# Patient Record
Sex: Female | Born: 1940
Health system: Southern US, Community
[De-identification: ages and names within clinical notes are randomized; demographics above are authoritative.]

## PROBLEM LIST (undated history)

## (undated) DIAGNOSIS — K589 Irritable bowel syndrome without diarrhea: Secondary | ICD-10-CM

## (undated) DIAGNOSIS — R079 Chest pain, unspecified: Secondary | ICD-10-CM

## (undated) DIAGNOSIS — F32A Depression, unspecified: Secondary | ICD-10-CM

## (undated) DIAGNOSIS — K219 Gastro-esophageal reflux disease without esophagitis: Secondary | ICD-10-CM

## (undated) DIAGNOSIS — G459 Transient cerebral ischemic attack, unspecified: Secondary | ICD-10-CM

## (undated) DIAGNOSIS — G4700 Insomnia, unspecified: Secondary | ICD-10-CM

## (undated) DIAGNOSIS — K635 Polyp of colon: Secondary | ICD-10-CM

## (undated) DIAGNOSIS — R739 Hyperglycemia, unspecified: Secondary | ICD-10-CM

## (undated) DIAGNOSIS — I319 Disease of pericardium, unspecified: Secondary | ICD-10-CM

## (undated) DIAGNOSIS — D0512 Intraductal carcinoma in situ of left breast: Secondary | ICD-10-CM

## (undated) DIAGNOSIS — K5730 Diverticulosis of large intestine without perforation or abscess without bleeding: Secondary | ICD-10-CM

## (undated) DIAGNOSIS — R51 Headache: Secondary | ICD-10-CM

## (undated) DIAGNOSIS — F329 Major depressive disorder, single episode, unspecified: Secondary | ICD-10-CM

## (undated) DIAGNOSIS — C801 Malignant (primary) neoplasm, unspecified: Secondary | ICD-10-CM

## (undated) DIAGNOSIS — I10 Essential (primary) hypertension: Secondary | ICD-10-CM

## (undated) DIAGNOSIS — M1990 Unspecified osteoarthritis, unspecified site: Secondary | ICD-10-CM

## (undated) DIAGNOSIS — H409 Unspecified glaucoma: Secondary | ICD-10-CM

## (undated) DIAGNOSIS — I639 Cerebral infarction, unspecified: Secondary | ICD-10-CM

## (undated) DIAGNOSIS — Z8719 Personal history of other diseases of the digestive system: Secondary | ICD-10-CM

## (undated) DIAGNOSIS — E785 Hyperlipidemia, unspecified: Secondary | ICD-10-CM

## (undated) DIAGNOSIS — E039 Hypothyroidism, unspecified: Secondary | ICD-10-CM

## (undated) DIAGNOSIS — N951 Menopausal and female climacteric states: Secondary | ICD-10-CM

## (undated) DIAGNOSIS — Z8489 Family history of other specified conditions: Secondary | ICD-10-CM

## (undated) DIAGNOSIS — E119 Type 2 diabetes mellitus without complications: Secondary | ICD-10-CM

## (undated) DIAGNOSIS — H269 Unspecified cataract: Secondary | ICD-10-CM

## (undated) DIAGNOSIS — G510 Bell's palsy: Secondary | ICD-10-CM

## (undated) HISTORY — PX: TUBAL LIGATION: SHX77

## (undated) HISTORY — DX: Cerebral infarction, unspecified: I63.9

## (undated) HISTORY — PX: FRACTURE SURGERY: SHX138

## (undated) HISTORY — PX: APPENDECTOMY: SHX54

## (undated) HISTORY — DX: Hypothyroidism, unspecified: E03.9

## (undated) HISTORY — DX: Menopausal and female climacteric states: N95.1

## (undated) HISTORY — PX: PARTIAL HYSTERECTOMY: SHX80

## (undated) HISTORY — DX: Polyp of colon: K63.5

## (undated) HISTORY — DX: Unspecified glaucoma: H40.9

## (undated) HISTORY — DX: Hyperglycemia, unspecified: R73.9

## (undated) HISTORY — DX: Hyperlipidemia, unspecified: E78.5

## (undated) HISTORY — DX: Transient cerebral ischemic attack, unspecified: G45.9

## (undated) HISTORY — DX: Essential (primary) hypertension: I10

## (undated) HISTORY — DX: Insomnia, unspecified: G47.00

## (undated) HISTORY — PX: TONSILLECTOMY: SUR1361

## (undated) HISTORY — PX: BREAST EXCISIONAL BIOPSY: SUR124

## (undated) HISTORY — DX: Unspecified cataract: H26.9

## (undated) HISTORY — PX: COLONOSCOPY: SHX174

## (undated) HISTORY — PX: CARDIAC CATHETERIZATION: SHX172

## (undated) HISTORY — DX: Diverticulosis of large intestine without perforation or abscess without bleeding: K57.30

---

## 1969-11-28 HISTORY — PX: ECTOPIC PREGNANCY SURGERY: SHX613

## 1979-07-30 DIAGNOSIS — I319 Disease of pericardium, unspecified: Secondary | ICD-10-CM

## 1979-07-30 HISTORY — DX: Disease of pericardium, unspecified: I31.9

## 1988-11-28 HISTORY — PX: CHOLECYSTECTOMY: SHX55

## 1998-06-05 ENCOUNTER — Emergency Department (HOSPITAL_COMMUNITY): Admission: EM | Admit: 1998-06-05 | Discharge: 1998-06-05 | Payer: Self-pay | Admitting: Emergency Medicine

## 1998-06-28 DIAGNOSIS — D126 Benign neoplasm of colon, unspecified: Secondary | ICD-10-CM | POA: Insufficient documentation

## 1998-07-21 ENCOUNTER — Other Ambulatory Visit: Admission: RE | Admit: 1998-07-21 | Discharge: 1998-07-21 | Payer: Self-pay | Admitting: Gastroenterology

## 1998-09-01 ENCOUNTER — Other Ambulatory Visit: Admission: RE | Admit: 1998-09-01 | Discharge: 1998-09-01 | Payer: Self-pay | Admitting: Gastroenterology

## 2000-03-13 ENCOUNTER — Encounter: Payer: Self-pay | Admitting: Obstetrics and Gynecology

## 2000-03-13 ENCOUNTER — Encounter: Admission: RE | Admit: 2000-03-13 | Discharge: 2000-03-13 | Payer: Self-pay | Admitting: Obstetrics and Gynecology

## 2000-08-15 ENCOUNTER — Encounter: Admission: RE | Admit: 2000-08-15 | Discharge: 2000-08-15 | Payer: Self-pay | Admitting: Family Medicine

## 2000-08-15 ENCOUNTER — Encounter: Payer: Self-pay | Admitting: Family Medicine

## 2001-03-26 ENCOUNTER — Encounter: Admission: RE | Admit: 2001-03-26 | Discharge: 2001-03-26 | Payer: Self-pay | Admitting: Obstetrics and Gynecology

## 2001-03-26 ENCOUNTER — Encounter: Payer: Self-pay | Admitting: Obstetrics and Gynecology

## 2001-10-28 DIAGNOSIS — K649 Unspecified hemorrhoids: Secondary | ICD-10-CM | POA: Insufficient documentation

## 2002-03-27 ENCOUNTER — Encounter: Payer: Self-pay | Admitting: Obstetrics and Gynecology

## 2002-03-27 ENCOUNTER — Encounter: Admission: RE | Admit: 2002-03-27 | Discharge: 2002-03-27 | Payer: Self-pay | Admitting: Obstetrics and Gynecology

## 2003-03-31 ENCOUNTER — Encounter: Admission: RE | Admit: 2003-03-31 | Discharge: 2003-03-31 | Payer: Self-pay | Admitting: Obstetrics and Gynecology

## 2003-03-31 ENCOUNTER — Encounter: Payer: Self-pay | Admitting: Obstetrics and Gynecology

## 2004-04-02 ENCOUNTER — Encounter: Admission: RE | Admit: 2004-04-02 | Discharge: 2004-04-02 | Payer: Self-pay | Admitting: Obstetrics and Gynecology

## 2004-08-28 ENCOUNTER — Encounter (INDEPENDENT_AMBULATORY_CARE_PROVIDER_SITE_OTHER): Payer: Self-pay | Admitting: Internal Medicine

## 2005-04-14 ENCOUNTER — Encounter: Admission: RE | Admit: 2005-04-14 | Discharge: 2005-04-14 | Payer: Self-pay | Admitting: Obstetrics and Gynecology

## 2005-06-24 ENCOUNTER — Ambulatory Visit: Payer: Self-pay | Admitting: Family Medicine

## 2005-06-28 DIAGNOSIS — K5730 Diverticulosis of large intestine without perforation or abscess without bleeding: Secondary | ICD-10-CM

## 2005-06-28 DIAGNOSIS — K449 Diaphragmatic hernia without obstruction or gangrene: Secondary | ICD-10-CM | POA: Insufficient documentation

## 2005-06-28 HISTORY — DX: Diverticulosis of large intestine without perforation or abscess without bleeding: K57.30

## 2005-06-28 LAB — HM COLONOSCOPY

## 2005-07-05 ENCOUNTER — Ambulatory Visit: Payer: Self-pay | Admitting: Gastroenterology

## 2005-07-21 ENCOUNTER — Ambulatory Visit: Payer: Self-pay | Admitting: Gastroenterology

## 2005-08-15 ENCOUNTER — Ambulatory Visit: Payer: Self-pay | Admitting: Family Medicine

## 2006-03-13 ENCOUNTER — Ambulatory Visit: Payer: Self-pay | Admitting: Family Medicine

## 2006-04-18 ENCOUNTER — Encounter: Admission: RE | Admit: 2006-04-18 | Discharge: 2006-04-18 | Payer: Self-pay | Admitting: Obstetrics and Gynecology

## 2006-07-25 ENCOUNTER — Ambulatory Visit: Payer: Self-pay | Admitting: Family Medicine

## 2006-12-11 ENCOUNTER — Ambulatory Visit: Payer: Self-pay | Admitting: Family Medicine

## 2006-12-11 LAB — CONVERTED CEMR LAB
ALT: 32 units/L (ref 0–40)
AST: 43 units/L — ABNORMAL HIGH (ref 0–37)
Chol/HDL Ratio, serum: 3.6
HDL: 46.6 mg/dL (ref >39.0)
Potassium: 3.7 meq/L (ref 3.5–5.1)
TSH: 0.2 microintl units/mL — ABNORMAL LOW (ref 0.35–5.50)
Triglyceride fasting, serum: 144 mg/dL (ref 0–149)
VLDL: 29 mg/dL (ref 0–40)

## 2007-01-30 ENCOUNTER — Ambulatory Visit: Payer: Self-pay | Admitting: Family Medicine

## 2007-01-30 LAB — CONVERTED CEMR LAB
Chloride: 104 meq/L (ref 96–112)
GFR calc Af Amer: 108 mL/min
Potassium: 3.8 meq/L (ref 3.5–5.1)
Sodium: 140 meq/L (ref 135–145)

## 2007-03-09 ENCOUNTER — Ambulatory Visit: Payer: Self-pay | Admitting: Family Medicine

## 2007-03-09 LAB — CONVERTED CEMR LAB: TSH: 0.05 microintl units/mL — ABNORMAL LOW (ref 0.35–5.50)

## 2007-04-20 ENCOUNTER — Encounter: Admission: RE | Admit: 2007-04-20 | Discharge: 2007-04-20 | Payer: Self-pay | Admitting: Obstetrics and Gynecology

## 2007-06-13 ENCOUNTER — Encounter (INDEPENDENT_AMBULATORY_CARE_PROVIDER_SITE_OTHER): Payer: Self-pay | Admitting: *Deleted

## 2007-07-17 ENCOUNTER — Encounter (INDEPENDENT_AMBULATORY_CARE_PROVIDER_SITE_OTHER): Payer: Self-pay | Admitting: Internal Medicine

## 2007-07-17 DIAGNOSIS — E782 Mixed hyperlipidemia: Secondary | ICD-10-CM

## 2007-07-17 DIAGNOSIS — R000 Tachycardia, unspecified: Secondary | ICD-10-CM

## 2007-07-17 DIAGNOSIS — R011 Cardiac murmur, unspecified: Secondary | ICD-10-CM

## 2007-07-17 DIAGNOSIS — Z87891 Personal history of nicotine dependence: Secondary | ICD-10-CM

## 2007-07-17 DIAGNOSIS — E785 Hyperlipidemia, unspecified: Secondary | ICD-10-CM

## 2007-07-17 DIAGNOSIS — Z8679 Personal history of other diseases of the circulatory system: Secondary | ICD-10-CM | POA: Insufficient documentation

## 2007-08-02 ENCOUNTER — Ambulatory Visit: Payer: Self-pay | Admitting: Family Medicine

## 2007-08-02 DIAGNOSIS — R609 Edema, unspecified: Secondary | ICD-10-CM

## 2007-08-02 DIAGNOSIS — E039 Hypothyroidism, unspecified: Secondary | ICD-10-CM | POA: Insufficient documentation

## 2007-09-10 ENCOUNTER — Ambulatory Visit: Payer: Self-pay | Admitting: Internal Medicine

## 2007-09-13 ENCOUNTER — Encounter (INDEPENDENT_AMBULATORY_CARE_PROVIDER_SITE_OTHER): Payer: Self-pay | Admitting: Internal Medicine

## 2007-11-06 ENCOUNTER — Ambulatory Visit: Payer: Self-pay | Admitting: Family Medicine

## 2007-11-30 ENCOUNTER — Encounter: Admission: RE | Admit: 2007-11-30 | Discharge: 2007-11-30 | Payer: Self-pay | Admitting: Obstetrics and Gynecology

## 2007-12-27 ENCOUNTER — Encounter (INDEPENDENT_AMBULATORY_CARE_PROVIDER_SITE_OTHER): Payer: Self-pay | Admitting: Internal Medicine

## 2007-12-28 ENCOUNTER — Encounter (INDEPENDENT_AMBULATORY_CARE_PROVIDER_SITE_OTHER): Payer: Self-pay | Admitting: Internal Medicine

## 2008-03-03 ENCOUNTER — Telehealth (INDEPENDENT_AMBULATORY_CARE_PROVIDER_SITE_OTHER): Payer: Self-pay | Admitting: Internal Medicine

## 2008-05-04 HISTORY — PX: CYSTECTOMY: SUR359

## 2008-06-23 ENCOUNTER — Encounter (INDEPENDENT_AMBULATORY_CARE_PROVIDER_SITE_OTHER): Payer: Self-pay | Admitting: Internal Medicine

## 2008-07-08 ENCOUNTER — Telehealth (INDEPENDENT_AMBULATORY_CARE_PROVIDER_SITE_OTHER): Payer: Self-pay | Admitting: Internal Medicine

## 2008-07-21 ENCOUNTER — Encounter: Payer: Self-pay | Admitting: Family Medicine

## 2008-09-11 ENCOUNTER — Telehealth (INDEPENDENT_AMBULATORY_CARE_PROVIDER_SITE_OTHER): Payer: Self-pay | Admitting: Internal Medicine

## 2008-09-16 ENCOUNTER — Ambulatory Visit: Payer: Self-pay | Admitting: Family Medicine

## 2008-09-16 DIAGNOSIS — K219 Gastro-esophageal reflux disease without esophagitis: Secondary | ICD-10-CM | POA: Insufficient documentation

## 2008-09-18 LAB — CONVERTED CEMR LAB
ALT: 28 units/L (ref 0–35)
BUN: 6 mg/dL (ref 6–23)
CO2: 28 meq/L (ref 19–32)
Calcium: 9.4 mg/dL (ref 8.4–10.5)
Chloride: 105 meq/L (ref 96–112)
Direct LDL: 154 mg/dL
GFR calc Af Amer: 108 mL/min
Glucose, Bld: 124 mg/dL — ABNORMAL HIGH (ref 70–99)
HDL: 53.4 mg/dL (ref >39.0)
Sodium: 141 meq/L (ref 135–145)
Total CHOL/HDL Ratio: 4.6
Triglycerides: 146 mg/dL (ref 0–149)
VLDL: 29 mg/dL (ref 0–40)

## 2008-09-19 ENCOUNTER — Ambulatory Visit: Payer: Self-pay | Admitting: Family Medicine

## 2008-09-19 DIAGNOSIS — E119 Type 2 diabetes mellitus without complications: Secondary | ICD-10-CM | POA: Insufficient documentation

## 2008-11-11 ENCOUNTER — Telehealth (INDEPENDENT_AMBULATORY_CARE_PROVIDER_SITE_OTHER): Payer: Self-pay | Admitting: Internal Medicine

## 2008-12-04 ENCOUNTER — Encounter: Admission: RE | Admit: 2008-12-04 | Discharge: 2008-12-04 | Payer: Self-pay | Admitting: Obstetrics and Gynecology

## 2008-12-15 ENCOUNTER — Ambulatory Visit: Payer: Self-pay | Admitting: Family Medicine

## 2008-12-17 ENCOUNTER — Telehealth (INDEPENDENT_AMBULATORY_CARE_PROVIDER_SITE_OTHER): Payer: Self-pay | Admitting: Internal Medicine

## 2008-12-17 LAB — CONVERTED CEMR LAB
Glucose, Bld: 107 mg/dL — ABNORMAL HIGH (ref 70–99)
HDL: 55.5 mg/dL (ref >39.0)
LDL Cholesterol: 87 mg/dL (ref 0–99)

## 2008-12-23 ENCOUNTER — Ambulatory Visit: Payer: Self-pay | Admitting: Family Medicine

## 2009-02-02 ENCOUNTER — Encounter: Payer: Self-pay | Admitting: Family Medicine

## 2009-02-03 ENCOUNTER — Telehealth: Payer: Self-pay | Admitting: Family Medicine

## 2009-02-19 ENCOUNTER — Telehealth (INDEPENDENT_AMBULATORY_CARE_PROVIDER_SITE_OTHER): Payer: Self-pay | Admitting: Internal Medicine

## 2009-03-24 ENCOUNTER — Ambulatory Visit: Payer: Self-pay | Admitting: Family Medicine

## 2009-03-25 ENCOUNTER — Telehealth (INDEPENDENT_AMBULATORY_CARE_PROVIDER_SITE_OTHER): Payer: Self-pay | Admitting: Internal Medicine

## 2009-03-25 LAB — CONVERTED CEMR LAB
Total CHOL/HDL Ratio: 5
Triglycerides: 175 mg/dL — ABNORMAL HIGH (ref 0.0–149.0)
VLDL: 35 mg/dL (ref 0.0–40.0)

## 2009-06-19 ENCOUNTER — Telehealth (INDEPENDENT_AMBULATORY_CARE_PROVIDER_SITE_OTHER): Payer: Self-pay | Admitting: Internal Medicine

## 2009-06-23 ENCOUNTER — Ambulatory Visit: Payer: Self-pay | Admitting: Family Medicine

## 2009-06-24 LAB — CONVERTED CEMR LAB
Direct LDL: 151.4 mg/dL
TSH: 0.52 microintl units/mL (ref 0.35–5.50)
Total CHOL/HDL Ratio: 5
Triglycerides: 270 mg/dL — ABNORMAL HIGH (ref 0.0–149.0)
VLDL: 54 mg/dL — ABNORMAL HIGH (ref 0.0–40.0)

## 2009-06-29 ENCOUNTER — Ambulatory Visit: Payer: Self-pay | Admitting: Family Medicine

## 2009-06-29 ENCOUNTER — Encounter (INDEPENDENT_AMBULATORY_CARE_PROVIDER_SITE_OTHER): Payer: Self-pay | Admitting: Internal Medicine

## 2009-07-16 ENCOUNTER — Encounter: Payer: Self-pay | Admitting: Family Medicine

## 2009-07-16 ENCOUNTER — Encounter (INDEPENDENT_AMBULATORY_CARE_PROVIDER_SITE_OTHER): Payer: Self-pay | Admitting: Internal Medicine

## 2009-08-11 ENCOUNTER — Ambulatory Visit: Payer: Self-pay | Admitting: Family Medicine

## 2009-08-11 DIAGNOSIS — IMO0002 Reserved for concepts with insufficient information to code with codable children: Secondary | ICD-10-CM

## 2009-08-11 LAB — CONVERTED CEMR LAB
Bacteria, UA: 0
Bilirubin Urine: NEGATIVE
Glucose, Urine, Semiquant: NEGATIVE
Ketones, urine, test strip: NEGATIVE
Nitrite: NEGATIVE

## 2009-11-14 ENCOUNTER — Encounter (INDEPENDENT_AMBULATORY_CARE_PROVIDER_SITE_OTHER): Payer: Self-pay | Admitting: Internal Medicine

## 2009-12-07 ENCOUNTER — Encounter: Admission: RE | Admit: 2009-12-07 | Discharge: 2009-12-07 | Payer: Self-pay | Admitting: Obstetrics and Gynecology

## 2010-01-15 ENCOUNTER — Telehealth: Payer: Self-pay | Admitting: Family Medicine

## 2010-04-15 ENCOUNTER — Telehealth: Payer: Self-pay | Admitting: Family Medicine

## 2010-05-21 ENCOUNTER — Encounter (INDEPENDENT_AMBULATORY_CARE_PROVIDER_SITE_OTHER): Payer: Self-pay | Admitting: *Deleted

## 2010-06-15 ENCOUNTER — Ambulatory Visit: Payer: Self-pay | Admitting: Family Medicine

## 2010-06-15 DIAGNOSIS — G4700 Insomnia, unspecified: Secondary | ICD-10-CM

## 2010-06-15 DIAGNOSIS — N951 Menopausal and female climacteric states: Secondary | ICD-10-CM | POA: Insufficient documentation

## 2010-06-16 ENCOUNTER — Telehealth: Payer: Self-pay | Admitting: Family Medicine

## 2010-06-16 LAB — CONVERTED CEMR LAB
ALT: 33 U/L
AST: 38 U/L — ABNORMAL HIGH
Albumin: 4.2 g/dL
Alkaline Phosphatase: 48 U/L
BUN: 11 mg/dL
Bilirubin, Direct: 0.1 mg/dL
CO2: 28 meq/L
Calcium: 9.5 mg/dL
Chloride: 107 meq/L
Cholesterol: 250 mg/dL — ABNORMAL HIGH
Creatinine, Ser: 0.7 mg/dL
Direct LDL: 157.2 mg/dL
GFR calc non Af Amer: 108.7 mL/min
Glucose, Bld: 106 mg/dL — ABNORMAL HIGH
HDL: 57.9 mg/dL
Potassium: 4.1 meq/L
Pro B Natriuretic peptide (BNP): 10.8 pg/mL
Sodium: 140 meq/L
TSH: 0.88 u[IU]/mL
Total Bilirubin: 0.6 mg/dL
Total CHOL/HDL Ratio: 4
Total Protein: 7 g/dL
Triglycerides: 323 mg/dL — ABNORMAL HIGH
VLDL: 64.6 mg/dL — ABNORMAL HIGH

## 2010-07-01 ENCOUNTER — Encounter (INDEPENDENT_AMBULATORY_CARE_PROVIDER_SITE_OTHER): Payer: Self-pay | Admitting: *Deleted

## 2010-07-13 ENCOUNTER — Encounter (INDEPENDENT_AMBULATORY_CARE_PROVIDER_SITE_OTHER): Payer: Self-pay | Admitting: *Deleted

## 2010-07-16 ENCOUNTER — Ambulatory Visit: Payer: Self-pay | Admitting: Family Medicine

## 2010-07-16 DIAGNOSIS — N390 Urinary tract infection, site not specified: Secondary | ICD-10-CM | POA: Insufficient documentation

## 2010-07-16 LAB — CONVERTED CEMR LAB
Bilirubin Urine: NEGATIVE
Blood in Urine, dipstick: NEGATIVE
Glucose, Urine, Semiquant: NEGATIVE
Ketones, urine, test strip: NEGATIVE
Nitrite: NEGATIVE
Urobilinogen, UA: 0.2

## 2010-07-17 ENCOUNTER — Encounter: Payer: Self-pay | Admitting: Family Medicine

## 2010-08-23 ENCOUNTER — Encounter (INDEPENDENT_AMBULATORY_CARE_PROVIDER_SITE_OTHER): Payer: Self-pay | Admitting: *Deleted

## 2010-08-26 ENCOUNTER — Ambulatory Visit: Payer: Self-pay | Admitting: Gastroenterology

## 2010-09-09 ENCOUNTER — Ambulatory Visit: Payer: Self-pay | Admitting: Gastroenterology

## 2010-09-12 ENCOUNTER — Encounter: Payer: Self-pay | Admitting: Gastroenterology

## 2010-10-08 ENCOUNTER — Telehealth: Payer: Self-pay | Admitting: Family Medicine

## 2010-11-12 LAB — HM DIABETES EYE EXAM

## 2010-11-13 ENCOUNTER — Encounter: Payer: Self-pay | Admitting: Family Medicine

## 2010-12-09 ENCOUNTER — Encounter
Admission: RE | Admit: 2010-12-09 | Discharge: 2010-12-09 | Payer: Self-pay | Source: Home / Self Care | Attending: Family Medicine | Admitting: Family Medicine

## 2010-12-15 ENCOUNTER — Telehealth: Payer: Self-pay | Admitting: Family Medicine

## 2010-12-15 ENCOUNTER — Encounter: Payer: Self-pay | Admitting: Family Medicine

## 2010-12-16 ENCOUNTER — Encounter
Admission: RE | Admit: 2010-12-16 | Discharge: 2010-12-16 | Payer: Self-pay | Source: Home / Self Care | Attending: Family Medicine | Admitting: Family Medicine

## 2010-12-16 LAB — HM MAMMOGRAPHY: HM Mammogram: NORMAL

## 2010-12-28 NOTE — Procedures (Signed)
Summary: Colonoscopy  Patient: Kelly Kennedy Note: All result statuses are Final unless otherwise noted.  Tests: (1) Colonoscopy (COL)   COL Colonoscopy           DONE     [facility]     [address]     [place], [place]  [address]           COLONOSCOPY PROCEDURE REPORT           PATIENT:  Kelly Kennedy, Kelly Kennedy  MR#:  [id number]     BIRTHDATE:  1941/02/16, 69 yrs. old  GENDER:  female     ENDOSCOPIST:  Judie Petit T. Russella Dar, MD, [facility]           PROCEDURE DATE:  09/09/2010     PROCEDURE:  Colonoscopy with snare polypectomy     ASA CLASS:  Class II     INDICATIONS:  1) surveillance and high-risk screening  2)     follow-up of polyp: adenomtous polyp, 1999     MEDICATIONS:   Fentanyl 75 mcg IV, Versed 9 mg IV     DESCRIPTION OF PROCEDURE:   After the risks benefits and     alternatives of the procedure were thoroughly explained, informed     consent was obtained.  Digital rectal exam was performed and     revealed no abnormalities.   The LB [id number]-[id number] endoscope     was introduced through the anus and advanced to the cecum, which     was identified by both the appendix and ileocecal valve, without     limitations.  The quality of the prep was good, using MoviPrep.     The instrument was then slowly withdrawn as the colon was fully     examined.     <<PROCEDUREIMAGES>>     FINDINGS:  Three polyps were found in the ascending colon. They     were 7 - 12 mm in size. Polyps were snared, then cauterized with     monopolar cautery. Retrieval was successful. A sessile polyp was     found in the mid transverse colon. It was 5 mm in size. Polyp was     snared without cautery. Retrieval was successful. A normal     appearing cecum, ileocecal valve, and appendiceal orifice were     identified. The hepatic flexure, splenic flexure, descending,     sigmoid colon, and rectum appeared unremarkable.  Retroflexed     views in the rectum revealed internal hemorrhoids, small.  The  time to cecum =  3.75  minutes. The scope was then withdrawn (time     =  13  min) from the patient and the procedure completed.           COMPLICATIONS:  None           ENDOSCOPIC IMPRESSION:     1) 7 - 12 mm Three polyps in the ascending colon     2) 5 mm sessile polyp in the mid transverse colon     3) Internal hemorrhoids           RECOMMENDATIONS:     1) No aspirin or NSAID's for 2 weeks     2) Await pathology results     3) Repeat Colonoscopy in 3 years if polyp(s) adenomatous           Malcolm T. Russella Dar, MD, [facility]           CC: Crawford Givens, MD  n.     eSIGNED:   Malcolm T. Stark at 09/09/2010 10:36 AM           Royetta Crochet, [phone]  Note: An exclamation mark (!) indicates a result that was not dispersed into the flowsheet. Document Creation Date: 09/09/2010 10:36 AM _______________________________________________________________________  (1) Order result status: Final Collection or observation date-time: 09/09/2010 10:26 Requested date-time:  Receipt date-time:  Reported date-time:  Referring Physician:   Ordering Physician: Claudette Head [phone]) Specimen Source:  Source: Launa Grill Order Number: [id number] Lab site:   Appended Document: Colonoscopy     Procedures Next Due Date:    Colonoscopy: 08/2013

## 2010-12-28 NOTE — Letter (Signed)
Summary: Colonoscopy Letter  [facility]  [address] [place], [place] [address]   Phone: [phone]  Fax: [phone]      May 21, 2010 MRN: [id number]   [facility] [address] [place], [place]  [address]   Dear Ms. Aburto,   According to your medical record, it is time for you to schedule a Colonoscopy. The American Cancer Society recommends this procedure as a method to detect early colon cancer. Patients with a family history of colon cancer, or a personal history of colon polyps or inflammatory bowel disease are at increased risk.  This letter has beeen generated based on the recommendations made at the time of your procedure. If you feel that in your particular situation this may no longer apply, please contact our office.  Please call our office at [phone] to schedule this appointment or to update your records at your earliest convenience.  Thank you for cooperating with [place] to provide you with the very best care possible.   Sincerely,  Judie Petit T. Russella Dar, M.D.  [facility] Gastroenterology Division [phone]

## 2010-12-28 NOTE — Letter (Signed)
Summary: Moviprep Instructions  [facility]  [address]   [place], [place] [address]   Phone: [phone]  Fax: [phone]       Kelly Kennedy    [birth year]    MRN: [id number]        Procedure Day /Date: Thursday   09-09-10     Arrival Time:  9:00 a.m.     Procedure Time:  10:00 a.m.     Location of Procedure:                    x  [facility] (4th Floor)  PREPARATION FOR COLONOSCOPY WITH MOVIPREP   Starting 5 days prior to your procedure 09-04-10  do not eat nuts, seeds, popcorn, corn, beans, peas,  salads, or any raw vegetables.  Do not take any fiber supplements (e.g. Metamucil, Citrucel, and Benefiber).  THE DAY BEFORE YOUR PROCEDURE         DATE: 09-08-10   DAY: Wednesday  1.  Drink clear liquids the entire day-NO SOLID FOOD  2.  Do not drink anything colored red or purple.  Avoid juices with pulp.  No orange juice.  3.  Drink at least 64 oz. (8 glasses) of fluid/clear liquids during the day to prevent dehydration and help the prep work efficiently.  CLEAR LIQUIDS INCLUDE: Water Jello Ice Popsicles Tea (sugar ok, no milk/cream) Powdered fruit flavored drinks Coffee (sugar ok, no milk/cream) Gatorade Juice: apple, white grape, white cranberry  Lemonade Clear bullion, consomm, broth Carbonated beverages (any kind) Strained chicken noodle soup Hard Candy                             4.  In the morning, mix first dose of MoviPrep solution:    Empty 1 Pouch A and 1 Pouch B into the disposable container    Add lukewarm drinking water to the top line of the container. Mix to dissolve    Refrigerate (mixed solution should be used within 24 hrs)  5.  Begin drinking the prep at 5:00 p.m. The MoviPrep container is divided by 4 marks.   Every 15 minutes drink the solution down to the next mark (approximately 8 oz) until the full liter is complete.   6.  Follow completed prep with 16 oz of clear liquid of your choice (Nothing red or  purple).  Continue to drink clear liquids until bedtime.  7.  Before going to bed, mix second dose of MoviPrep solution:    Empty 1 Pouch A and 1 Pouch B into the disposable container    Add lukewarm drinking water to the top line of the container. Mix to dissolve    Refrigerate  THE DAY OF YOUR PROCEDURE      DATE: 09-09-10  DAY: Thursday  Beginning at 5:00 a.m. (5 hours before procedure):         1. Every 15 minutes, drink the solution down to the next mark (approx 8 oz) until the full liter is complete.  2. Follow completed prep with 16 oz. of clear liquid of your choice.    3. You may drink clear liquids until  8:00 a.m.  (2 HOURS BEFORE PROCEDURE).   MEDICATION INSTRUCTIONS  Unless otherwise instructed, you should take regular prescription medications with a small sip of water   as early as possible the morning of your procedure.   Additional medication instructions: Hold Lasix the morning of procedure.  OTHER INSTRUCTIONS  You will need a responsible adult at least 69 years of age to accompany you and drive you home.   This person must remain in the waiting room during your procedure.  Wear loose fitting clothing that is easily removed.  Leave jewelry and other valuables at home.  However, you may wish to bring a book to read or  an iPod/MP3 player to listen to music as you wait for your procedure to start.  Remove all body piercing jewelry and leave at home.  Total time from sign-in until discharge is approximately 2-3 hours.  You should go home directly after your procedure and rest.  You can resume normal activities the  day after your procedure.  The day of your procedure you should not:   Drive   Make legal decisions   Operate machinery   Drink alcohol   Return to work  You will receive specific instructions about eating, activities and medications before you leave.    The above instructions have been reviewed and explained to me by    Wyona Almas RN  August 26, 2010 10:21 AM    I fully understand and can verbalize these instructions _____________________________ Date _________

## 2010-12-28 NOTE — Miscellaneous (Signed)
Summary: [facility] Pervisit/prep  Clinical Lists Changes  Medications: Added new medication of MOVIPREP 100 GM  SOLR (PEG-KCL-NACL-NASULF-NA ASC-C) As per prep instructions. - Signed Rx of MOVIPREP 100 GM  SOLR (PEG-KCL-NACL-NASULF-NA ASC-C) As per prep instructions.;  #1 x 0;  Signed;  Entered by: Wyona Almas RN;  Authorized by: Meryl Dare MD [facility];  Method used: [facility] [address] [phone]*, [address] [place], [place], [place]  [address], Ph: [phone] or [phone], Fax: [phone] Observations: Added new observation of ALLERGY REV: Done (08/26/2010 9:46)    Prescriptions: MOVIPREP 100 GM  SOLR (PEG-KCL-NACL-NASULF-NA ASC-C) As per prep instructions.  #1 x 0   Entered by:   Wyona Almas RN   Authorized by:   Meryl Dare MD [facility]   Signed by:   Wyona Almas RN on 08/26/2010   Method used:   Electronically to        [facility] [address] [phone]* (retail)       [address]       [place], [place]  [address]       Ph: [phone] or [phone]       Fax: [phone]   RxID:   [id number]

## 2010-12-28 NOTE — Progress Notes (Signed)
Summary: Amitriptyline  Phone Note Refill Request Message from:  Scriptline on January 15, 2010 11:33 AM  Refills Requested: Medication #1:  AMITRIPTYLINE HCL 50 MG  TABS Take 1 or 2 by mouth at bedtime [facility] [address] [phone]*   Last Fill Date:  No date sent   Pharmacy Phone:  [phone]   Method Requested: Electronic Initial call taken by: Delilah Shan CMA Duncan Dull),  January 15, 2010 11:33 AM  Follow-up for Phone Call        can refil one time in Dr Lorenza Chick absence px written on EMR for call in  Follow-up by: Judith Part MD,  January 15, 2010 1:38 PM  Additional Follow-up for Phone Call Additional follow up Details #1::        [facility] as instructed. Lewanda Rife LPN  January 15, 2010 1:48 PM     New/Updated Medications: AMITRIPTYLINE HCL 50 MG  TABS (AMITRIPTYLINE HCL) Take 1 or 2 by mouth at bedtime Prescriptions: AMITRIPTYLINE HCL 50 MG  TABS (AMITRIPTYLINE HCL) Take 1 or 2 by mouth at bedtime  #60 x 0   Entered and Authorized by:   Judith Part MD   Signed by:   Lewanda Rife LPN on 56/21/3086   Method used:   Telephoned to ...       [facility] [address] [phone]* (retail)       [address]       [place], [place]  [address]       Ph: [phone] or [phone]       Fax: [phone]   RxID:   [id number]

## 2010-12-28 NOTE — Assessment & Plan Note (Signed)
Summary: BLADDER INF/DLO   Vital Signs:  Patient profile:   70 year old female Height:      63.75 inches Weight:      190 pounds BMI:     32.99 Temp:     98.2 degrees F oral Pulse rate:   100 / minute Pulse rhythm:   regular BP sitting:   152 / 78  (left arm) Cuff size:   large  Vitals Entered By: Delilah Shan CMA Duncan Dull) (July 16, 2010 3:26 PM) CC: ? UTI   History of Present Illness: Saw blood for 2 days.  Some hesitancy going on for about 1 week.  No burning.  Feels like incomplete voiding.   abdominal pain: no but occ lower back pain fevers:no back pain: as above vomiting:no but occ nausea other concerns:  "I haven't had one (UTI) in a long time".  No new discharge.   Asking about holding off on antibiotics since she has a h/o yeast infections after tx.   Allergies: 1)  ! Niaspan (Niacin (Antihyperlipidemic))  Review of Systems       See HPI.  Otherwise negative.    Physical Exam  General:  GEN: nad, alert and oriented HEENT: mucous membranes moist NECK: supple CV: rrr.  PULM: ctab, no inc wob ABD: soft, +bs, not tender at suprapubic area, slightly tender at r waist band posterior to midaxillary line EXT: no edema SKIN: no acute rash   Impression & Recommendations:  Problem # 1:  UTI (ICD-599.0)  Will check ucx and contact patient- per her request not to use antibiotics today.  I d/w her ZO:XWRUEAV from this point forward.  Okay for outpatient follow up.  No CVA pain and no h/o fevers.  Her updated medication list for this problem includes:    Metronidazole 500 Mg Tabs (Metronidazole) .Marland Kitchen..Marland Kitchen Two tablets twice a day for two days when needed.  Orders: T-Culture, Urine ([id number])  Complete Medication List: 1)  Amitriptyline Hcl 50 Mg Tabs (Amitriptyline hcl) .... Take 1 or 2 by mouth at bedtime 2)  Climara 0.1 Mg/24hr Ptwk (Estradiol) .... Apply one patch weekly 3)  Fluocinonide 0.05 % Crea (Fluocinonide) .... As needed 4)  Tretinoin 0.025 % Crea  (Tretinoin) .... As needed 5)  Nitro-dur 0.4 Mg/hr Pt24 (Nitroglycerin) .... As needed 6)  Levothroid 100 Mcg Tabs (Levothyroxine sodium) .... Take one by mouth daily 7)  Nystatin-triamcinolone 100000-0.1 Unit/gm-% Crea (Nystatin-triamcinolone) .... Use as directed 8)  Metronidazole 500 Mg Tabs (Metronidazole) .... Two tablets twice a day for two days when needed. 9)  Cozaar 100 Mg Tabs (Losartan potassium) .... One tablet daily 10)  Ibuprofen 800 Mg Tabs (Ibuprofen) .... Take 1 every 8 hrs as needed back pain ---with food 11)  Furosemide 20 Mg Tabs (Furosemide) .... Take 1 tablet by mouth once a day 12)  Welchol 625 Mg Tabs (Colesevelam hcl) .... Take 2 tablets every 8 hours 13)  Triamcinolone Acetonide 0.1 % Crea (Triamcinolone acetonide) .... Apply two times a day to affected area 14)  Terconazole 0.8 % Crea (Terconazole) .... Apply to affected area once daily for 3 days as needed 15)  Genteal 0.3 % Soln (Hypromellose) 16)  Ocuvite Preservision Tabs (Multiple vitamins-minerals) .... Take 1 tablet by mouth once a day  Patient Instructions: 1)  We'll contact you with your lab report.  Let us know if you aren't doing better.  2)  Please schedule a follow-up appointment as needed .   Current Allergies (reviewed today): ! NIASPAN (NIACIN (  ANTIHYPERLIPIDEMIC))  Laboratory Results   Urine Tests  Date/Time Received: July 16, 2010 3:32 PM   Routine Urinalysis   Color: yellow Appearance: Clear Glucose: negative   (Normal Range: Negative) Bilirubin: negative   (Normal Range: Negative) Ketone: negative   (Normal Range: Negative) Spec. Gravity: 1.025   (Normal Range: 1.003-1.035) Blood: negative   (Normal Range: Negative) pH: 6.0   (Normal Range: 5.0-8.0) Protein: trace   (Normal Range: Negative) Urobilinogen: 0.2   (Normal Range: 0-1) Nitrite: negative   (Normal Range: Negative) Leukocyte Esterace: negative   (Normal Range: Negative)

## 2010-12-28 NOTE — Progress Notes (Signed)
  Phone Note Refill Request   Refills Requested: Medication #1:  AMITRIPTYLINE HCL 50 MG  TABS Take 1 or 2 by mouth at bedtime  Medication #2:  LEVOTHROID 100 MCG  TABS Take one by mouth daily    Prescriptions: LEVOTHROID 100 MCG  TABS (LEVOTHYROXINE SODIUM) Take one by mouth daily  #30 Tablet x 12   Entered and Authorized by:   Crawford Givens MD   Signed by:   Crawford Givens MD on 06/16/2010   Method used:   Electronically to        [facility] [address] [phone]* (retail)       [address]       [place], [place]  [address]       Ph: [phone] or [phone]       Fax: [phone]   RxID:   [id number] AMITRIPTYLINE HCL 50 MG  TABS (AMITRIPTYLINE HCL) Take 1 or 2 by mouth at bedtime  #60 x 12   Entered and Authorized by:   Crawford Givens MD   Signed by:   Crawford Givens MD on 06/16/2010   Method used:   Electronically to        [facility]  [facility] [address] [phone]* (retail)       [address]       [place], [place]  [address]       Ph: [phone] or [phone]       Fax: [phone]   RxID:   [id number]

## 2010-12-28 NOTE — Progress Notes (Signed)
Summary: Amitriptyline  Phone Note Refill Request Message from:  Scriptline on October 08, 2010 12:56 PM  Refills Requested: Medication #1:  AMITRIPTYLINE HCL 50 MG  TABS Take 1 or 2 by mouth at bedtime [facility]  [facility] [address] [phone]*   Last Fill Date:  No date sent   Pharmacy Phone:  [phone]   Method Requested: Electronic Initial call taken by: Delilah Shan CMA Nikyah Lackman Dull),  October 08, 2010 12:56 PM  Follow-up for Phone Call        sent.  Follow-up by: Crawford Givens MD,  October 08, 2010 1:38 PM    Prescriptions: AMITRIPTYLINE HCL 50 MG  TABS (AMITRIPTYLINE HCL) Take 1 or 2 by mouth at bedtime  #60 x 12   Entered and Authorized by:   Crawford Givens MD   Signed by:   Crawford Givens MD on 10/08/2010   Method used:   Electronically to        [facility]  [facility] [address] [phone]* (retail)       [address]       [place], [place]  [address]       Ph: [phone] or [phone]       Fax: [phone]   RxID:   [id number]

## 2010-12-28 NOTE — Letter (Signed)
Summary: Patient Notice- Polyp Results  [facility]  [address] [place], [place] [address]   Phone: [phone]  Fax: [phone]        September 12, 2010 MRN: [id number]    [facility] [address] [place], [place]  [address]    Dear Ms. Frenkel,  I am pleased to inform you that the colon polyp(s) removed during your recent colonoscopy was (were) found to be benign (no cancer detected) upon pathologic examination.  I recommend you have a repeat colonoscopy examination in 3 years to look for recurrent polyps, as having colon polyps increases your risk for having recurrent polyps or even colon cancer in the future.  Should you develop new or worsening symptoms of abdominal pain, bowel habit changes or bleeding from the rectum or bowels, please schedule an evaluation with either your primary care physician or with me.  Continue treatment plan as outlined the day of your exam.  Please call us if you are having persistent problems or have questions about your condition that have not been fully answered at this time.  Sincerely,  Meryl Dare MD [facility]  This letter has been electronically signed by your physician.  Appended Document: Patient Notice- Polyp Results letter mailed

## 2010-12-28 NOTE — Progress Notes (Signed)
Summary: Amitriptyline  Phone Note Refill Request Message from:  Scriptline on June 16, 2010 4:21 PM  Refills Requested: Medication #1:  AMITRIPTYLINE HCL 50 MG  TABS Take 1 or 2 by mouth at bedtime [facility] [address] [phone]*   Last Fill Date:  No date sent   Pharmacy Phone:  [phone]   Method Requested: Electronic Initial call taken by: Delilah Shan CMA Ashey Tramontana Dull),  June 16, 2010 4:22 PM    Prescriptions: AMITRIPTYLINE HCL 50 MG  TABS (AMITRIPTYLINE HCL) Take 1 or 2 by mouth at bedtime  #60 x 12   Entered and Authorized by:   Crawford Givens MD   Signed by:   Crawford Givens MD on 06/16/2010   Method used:   Electronically to        [facility] [address] [phone]* (retail)       [address]       [place], [place]  [address]       Ph: [phone] or [phone]       Fax: [phone]   RxID:   [id number]

## 2010-12-28 NOTE — Assessment & Plan Note (Signed)
Summary: REFILL MEDS/BILLIE'S PT/CLE   Vital Signs:  Patient profile:   70 year old female Height:      63.75 inches Weight:      190.50 pounds BMI:     33.08 Temp:     98 degrees F oral Pulse rate:   88 / minute Pulse rhythm:   regular BP sitting:   134 / 84  (left arm) Cuff size:   large  Vitals Entered By: Delilah Shan CMA  Dull) (07-13-2010 8:10 AM) CC: Refill meds (BDB)   History of Present Illness: Menopausal symptoms- on patch with relief.  "On it for years."  Was prev on premarin.  "I've tried to get off it and it was bad."  I have d/w patient risk and benefit of continuing therapy.   Hypertension:      Using medication without problems or lightheadedness: yes Chest pain with exertion:no Edema: in bilateral lower extremity, worse after standing, noted year-round by patient.  Per patient, had bilateral lower extremity eval at vein clinic what was unremarkable.  Short of breath:no Average home BPs: similar to today Other issues: followed by Dr. Roxanne Gates with cards.  Insomina- good relief with amytriptyline.  no adverse effect from the med.   Hypothyroid: no problems with medicine. No overt symptoms of over or under replacement.  Due for labs.   S/p dilation on EGD several years ago.  Has had some recent trouble swallowing solids, but does fine with liquids.  This is similar to prev (before stretching).    Has GI appointment pending.     Allergies: 1)  ! Niaspan (Niacin (Antihyperlipidemic))  Past History:  Family History: Last updated: July 13, 2010 F dead, stomach CA M alive  Social History: Last updated: July 13, 2010 From [place]  Retired, prev Scientist, product/process development Married, since 1959/05/08 3 children, 1 died of MI in 05-07-2005 prev smoker alcohol   Past Medical History: Hyperlipidemia Diverticulosis, colon (8/12006) hypothyroid insomnia menopausal symptoms  Past Surgical History: hyst-- ovaries intact splenic infarction-- 05/07/1992 [facility] 1990 ectopic preg--  1971 CT chest- L) thyroid lobe --7/05 stress echocardiogram-- neg-- EF 50%-- 4/02 carotid u/s-- neg-- 12/98 treadmill abn 8/97--- neg 10/03/ 5/05/ 7/05 myocardial perfusion study-- neg-- EF 51%-- 1997 EGD 8/06 with dilation colonoscopy polyp 8/99-- 12/02 neg---8/06 polyp and diverticulosis, internal hemorrhoids LE Art and Venous duplex studies, Renal Artery duplex study   all nml  (Dr Earnestine Leys) 06/2008 cyst on L lower arm--benign--2008-06-01  Family History: F dead, stomach CA M alive  Social History: From [employer].  Retired, prev Scientist, product/process development Married, since 05-08-1959 3 children, 1 died of MI in 2005-05-07 prev smoker alcohol   Review of Systems       See HPI.  Otherwise noncontributory.    Physical Exam  General:  GEN: nad, alert and oriented HEENT: mucous membranes moist NECK: supple w/o LA CV: rrr.   PULM: ctab, no inc wob ABD: soft, +bs EXT: trace edema in bilateral feet, 2+ DP pulses SKIN: no acute rash    Impression & Recommendations:  Problem # 1:  DEPENDENT EDEMA (ICD-782.3) Contact with labs, elevated feet and decrease salt intake.  Patient understands.   Her updated medication list for this problem includes:    Furosemide 20 Mg Tabs (Furosemide) .Marland Kitchen... Take 1 tablet by mouth once a day  Orders: TLB-BMP (Basic Metabolic Panel-BMET) (80048-METABOL) TLB-Hepatic/Liver Function Pnl (80076-HEPATIC) TLB-BNP (B-Natriuretic Peptide) (83880-BNPR)  Problem # 2:  HYPOTHYROIDISM (ICD-244.9) Will send in rx after labs resulted.  No abnormality on  neck exam today.  Her updated medication list for this problem includes:    Levothroid 100 Mcg Tabs (Levothyroxine sodium) .Marland Kitchen... Take one by mouth daily  Orders: TLB-TSH (Thyroid Stimulating Hormone) (84443-TSH)  Problem # 3:  HYPERLIPIDEMIA, MIXED (ICD-272.2) contact with labs and continue current meds.  Her updated medication list for this problem includes:    Welchol 625 Mg Tabs (Colesevelam hcl) .Marland Kitchen... Take 2 tablets every 8  hours  Orders: TLB-Lipid Panel (80061-LIPID)  Problem # 4:  INSOMNIA (ICD-780.52) continue current meds.    Problem # 5:  MENOPAUSAL SYNDROME (ICD-627.2) I have d/w patient re: risk benefit of continuing meds.  I have offered a taper of HRT.  She will consider.  No decision by patient today.  Her updated medication list for this problem includes:    Climara 0.1 Mg/24hr Ptwk (Estradiol) .Marland Kitchen... Apply one patch weekly  Complete Medication List: 1)  Amitriptyline Hcl 50 Mg Tabs (Amitriptyline hcl) .... Take 1 or 2 by mouth at bedtime 2)  Climara 0.1 Mg/24hr Ptwk (Estradiol) .... Apply one patch weekly 3)  Fluocinonide 0.05 % Crea (Fluocinonide) .... As needed 4)  Tretinoin 0.025 % Crea (Tretinoin) .... As needed 5)  Nitro-dur 0.4 Mg/hr Pt24 (Nitroglycerin) .... As needed 6)  Levothroid 100 Mcg Tabs (Levothyroxine sodium) .... Take one by mouth daily 7)  Nystatin-triamcinolone 100000-0.1 Unit/gm-% Crea (Nystatin-triamcinolone) .... Use as directed 8)  Metronidazole 500 Mg Tabs (Metronidazole) .... Two tablets twice a day for two days when needed. 9)  Cozaar 100 Mg Tabs (Losartan potassium) .... One tablet daily 10)  Ibuprofen 800 Mg Tabs (Ibuprofen) .... Take 1 every 8 hrs as needed back pain ---with food 11)  Furosemide 20 Mg Tabs (Furosemide) .... Take 1 tablet by mouth once a day 12)  Welchol 625 Mg Tabs (Colesevelam hcl) .... Take 2 tablets every 8 hours 13)  Triamcinolone Acetonide 0.1 % Crea (Triamcinolone acetonide) .... Apply two times a day to affected area 14)  Terconazole 0.8 % Crea (Terconazole) .... Apply to affected area once daily for 3 days as needed 15)  Genteal 0.3 % Soln (Hypromellose) 16)  Ocuvite Preservision Tabs (Multiple vitamins-minerals) .... Take 1 tablet by mouth once a day  Patient Instructions: 1)  Please schedule a follow-up appointment as needed .  We'll contact you with your lab report.  Keep your feet elevated as much as you can and let me know if the  swelling gets worse.  I want you to call the GI (stomach) doctors about your swallowing and your colonoscopy.    Current Allergies (reviewed today): ! NIASPAN (NIACIN (ANTIHYPERLIPIDEMIC))

## 2010-12-28 NOTE — Letter (Signed)
Summary: Kelly Kennedy letter  [facility]  [address] [place], [place] [address]   Phone: [phone]  Fax: [phone]       07/01/2010 MRN: [id number]  [facility] [address] [place], [place]  [address]  Dear Ms. Mulvaney,  [facility] [place], and [place] announce the retirement of Arta Silence, M.D., from full-time practice at the [facility] office effective May 27, 2010 and his plans of returning part-time.  It is important to Dr. Hetty Ely and to our practice that you understand that [facility] has seven physicians in our office for your health care needs.  We will continue to offer the same exceptional care that you have today.    Dr. Hetty Ely has spoken to many of you about his plans for retirement and returning part-time in the fall.   We will continue to work with you through the transition to schedule appointments for you in the office and meet the high standards that [facility] is committed to.   Again, it is with great pleasure that we share the news that Dr. Hetty Ely will return to [facility] in October of 2011 with a reduced schedule.    If you have any questions, or would like to request an appointment with one of our physicians, please call us at [phone] and press the option for Scheduling an appointment.  We take pleasure in providing you with excellent patient care and look forward to seeing you at your next office visit.  Our [facility] Physicians are:  Tillman Abide, M.D. Laurita Quint, M.D. Roxy Manns, M.D. Kerby Nora, M.D. Hannah Beat, M.D. Ruthe Mannan, M.D. We proudly welcomed Raechel Ache, M.D. and Eustaquio Boyden, M.D. to the practice in July/August 2011.  Sincerely,   [facility]

## 2010-12-28 NOTE — Progress Notes (Signed)
Summary: refill request for amitriptyline  Phone Note Refill Request Call back at Home Phone [phone] Message from:  Patient  Refills Requested: Medication #1:  AMITRIPTYLINE HCL 50 MG  TABS Take 1 or 2 by mouth at bedtime Phoned request from pt, [facility]  She has appt with Dr. Para March in july.  Initial call taken by: Lowella Petties CMA,  Apr 15, 2010 3:30 PM    Prescriptions: AMITRIPTYLINE HCL 50 MG  TABS (AMITRIPTYLINE HCL) Take 1 or 2 by mouth at bedtime  #60 x 1   Entered and Authorized by:   Shaune Leeks MD   Signed by:   Shaune Leeks MD on 04/15/2010   Method used:   [facility]ically to        [facility] [phone]* (retail)       [address]       [place], [place]  [address]       Ph: [phone] or [phone]       Fax: [phone]   RxID:   [id number]

## 2010-12-28 NOTE — Letter (Signed)
Summary: Pre Visit Letter Revised  [facility]  [address] [place], [place] [address]   Phone: [phone]  Fax: [phone]    07/13/2010 MRN: [id number]  [facility] [address] [place], [place]  [address]              Procedure Date:  09-09-10    Welcome to the Gastroenterology Division at [facility].    You are scheduled to see a nurse for your pre-procedure visit on 08-26-10 at 10:00a.m. on the 3rd floor at [facility], [address]  We ask that you try to arrive at our office 15 minutes prior to your appointment time to allow for check-in.  Please take a minute to review the attached form.  If you answer "Yes" to one or more of the questions on the first page, we ask that you call the person listed at your earliest opportunity.  If you answer "No" to all of the questions, please complete the rest of the form and bring it to your appointment.    Your nurse visit will consist of discussing your medical and surgical history, your immediate family medical history, and your medications.    If you are unable to list all of your medications on the form, please bring the medication bottles to your appointment and we will list them.  We will need to be aware of both prescribed and over the counter drugs.  We will need to know exact dosage information as well.    Please be prepared to read and sign documents such as consent forms, a financial agreement, and acknowledgement forms.  If necessary, and with your consent, a friend or relative is welcome to sit-in on the nurse visit with you.  Please bring your insurance card so that we may make a copy of it.  If your insurance requires a referral to see a specialist, please bring your referral form from your primary care physician.  No co-pay is required for this nurse visit.     If you cannot keep your appointment, please call [phone] to cancel or reschedule prior to your appointment date.  This  allows [place] the opportunity to schedule an appointment for another patient in need of care.   Thank you for choosing [facility] for your medical needs.  We appreciate the opportunity to care for you.  Please visit [place] at our website  to learn more about our practice.                     Sincerely,  The Gastroenterology Division

## 2010-12-30 NOTE — Progress Notes (Signed)
Summary: Form for additional views  Phone Note From Other Clinic   Caller: Breast Imaging Call For: Dr. Para March Summary of Call: Form to be signed for ordering additional views.  Patient has appt. on Thursday, December 16, 2010.  In your in box. Initial call taken by: Delilah Shan CMA [place]),  December 15, 2010 10:08 AM  Follow-up for Phone Call        signed, thanks.  Follow-up by: Crawford Givens MD,  December 15, 2010 1:46 PM  Additional Follow-up for Phone Call Additional follow up Details #1::        Faxed. Additional Follow-up by: Delilah Shan CMA [place]),  December 15, 2010 4:22 PM

## 2010-12-30 NOTE — Letter (Signed)
Summary: Kelly Kennedy OD  Kelly Kennedy OD   Imported By: Lanelle Bal 11/23/2010 13:03:21  _____________________________________________________________________  External Attachment:    Type:   Image     Comment:   External Document  Appended Document: Kelly Kennedy OD     Clinical Lists Changes  Observations: Added new observation of [id number]: 11/2011 (11/24/2010 11:53) Added new observation of DIAB EYE EX: no retinopathy (11/12/2010 11:53)       Diabetic Eye Exam  Procedure date:  11/12/2010  Findings:      no retinopathy  Procedures Next Due Date:    Diabetic Eye Exam: 11/2011

## 2011-05-08 ENCOUNTER — Telehealth: Payer: Self-pay | Admitting: Family Medicine

## 2011-05-08 MED ORDER — AMITRIPTYLINE HCL 50 MG PO TABS: 50.0000 mg | ORAL_TABLET | Freq: Every day | ORAL | Status: DC

## 2011-05-08 MED ORDER — LEVOTHYROXINE SODIUM 100 MCG PO TABS: 100.0000 ug | ORAL_TABLET | Freq: Every day | ORAL | Status: DC

## 2011-05-08 NOTE — Telephone Encounter (Signed)
Please end in meds to fax listed on hard copy and set up CPE.

## 2011-05-10 NOTE — Telephone Encounter (Signed)
Scripts faxed to [facility], lab and physical appts made.

## 2011-05-12 ENCOUNTER — Other Ambulatory Visit: Payer: Self-pay | Admitting: Family Medicine

## 2011-05-12 DIAGNOSIS — E039 Hypothyroidism, unspecified: Secondary | ICD-10-CM

## 2011-05-12 DIAGNOSIS — I10 Essential (primary) hypertension: Secondary | ICD-10-CM

## 2011-05-14 ENCOUNTER — Encounter: Payer: Self-pay | Admitting: Family Medicine

## 2011-05-16 ENCOUNTER — Other Ambulatory Visit (INDEPENDENT_AMBULATORY_CARE_PROVIDER_SITE_OTHER): Payer: MEDICARE | Admitting: Family Medicine

## 2011-05-16 DIAGNOSIS — E039 Hypothyroidism, unspecified: Secondary | ICD-10-CM

## 2011-05-16 DIAGNOSIS — I10 Essential (primary) hypertension: Secondary | ICD-10-CM

## 2011-05-16 LAB — LIPID PANEL
Cholesterol: 162 mg/dL (ref 0–200)
HDL: 55 mg/dL (ref >39.00)
LDL Cholesterol: 92 mg/dL (ref 0–99)
Triglycerides: 74 mg/dL (ref 0.0–149.0)
VLDL: 14.8 mg/dL (ref 0.0–40.0)

## 2011-05-16 LAB — COMPREHENSIVE METABOLIC PANEL
Albumin: 4.2 g/dL (ref 3.5–5.2)
Alkaline Phosphatase: 50 U/L (ref 39–117)
BUN: 17 mg/dL (ref 6–23)
Creatinine, Ser: 0.8 mg/dL (ref 0.4–1.2)
Glucose, Bld: 115 mg/dL — ABNORMAL HIGH (ref 70–99)
Total Bilirubin: 0.5 mg/dL (ref 0.3–1.2)

## 2011-05-19 ENCOUNTER — Encounter: Payer: Self-pay | Admitting: Family Medicine

## 2011-05-19 ENCOUNTER — Ambulatory Visit (INDEPENDENT_AMBULATORY_CARE_PROVIDER_SITE_OTHER): Payer: MEDICARE | Admitting: Family Medicine

## 2011-05-19 VITALS — BP 160/84 | HR 88 | Temp 98.1°F | Ht 64.5 in | Wt 189.0 lb

## 2011-05-19 DIAGNOSIS — Z9289 Personal history of other medical treatment: Secondary | ICD-10-CM

## 2011-05-19 DIAGNOSIS — R011 Cardiac murmur, unspecified: Secondary | ICD-10-CM

## 2011-05-19 DIAGNOSIS — Z9189 Other specified personal risk factors, not elsewhere classified: Secondary | ICD-10-CM

## 2011-05-19 DIAGNOSIS — E039 Hypothyroidism, unspecified: Secondary | ICD-10-CM

## 2011-05-19 DIAGNOSIS — R7309 Other abnormal glucose: Secondary | ICD-10-CM

## 2011-05-19 DIAGNOSIS — E785 Hyperlipidemia, unspecified: Secondary | ICD-10-CM

## 2011-05-19 DIAGNOSIS — Z23 Encounter for immunization: Secondary | ICD-10-CM

## 2011-05-19 MED ORDER — LEVOTHYROXINE SODIUM 100 MCG PO TABS: ORAL_TABLET | ORAL | Status: DC

## 2011-05-19 NOTE — Patient Instructions (Addendum)
Check with your insurance to see if they will cover the shingles shot. Change your thyroid medicine- you'll take 1/2 tab on Monday and 1 tab on the other days of the week.  We'll need to recheck your thyroid test in 2 months.  Schedule a lab appointment on the way out today.  I would get a flu shot each fall.   Keep walking and try to cut out sweets.  Take care.  Recheck labs at a physical in 1 year.

## 2011-05-19 NOTE — Progress Notes (Signed)
Hyperglycemia- Walking for exercise.  We talked about diet.  We talked about low fat substitution and fruit.  We talked about low sugar diet.   Hypothyroid, over-replaced.  Labs d/w pt. No neck mass or tenderness.   Hypertension:  130/80 on recheck.   Using medication without problems or lightheadedness: yes Chest pain with exertion:no Edema:no Average home BPs: not elevated.    Routine screening.  Prev with colonoscopy done 11/11.  Mammogram and breast exam done at breast center.  S/p hysterectomy.  Not due for dxa.  PNA shot today.  D/w pt about shingles and flu shots.    PMH and SH reviewed  ROS: See HPI, otherwise noncontributory.  Meds, vitals, and allergies reviewed.   GEN: nad, alert and oriented HEENT: mucous membranes moist NECK: supple w/o LA CV: rrr PULM: ctab, no inc wob ABD: soft, +bs EXT: no edema SKIN: no acute rash

## 2011-05-20 DIAGNOSIS — Z9289 Personal history of other medical treatment: Secondary | ICD-10-CM | POA: Insufficient documentation

## 2011-05-20 NOTE — Assessment & Plan Note (Signed)
Over replaced, dec replacement and then check TSH in 2 months.  She understood.

## 2011-05-20 NOTE — Assessment & Plan Note (Signed)
No change in meds.  On statin. Tolerated .

## 2011-05-20 NOTE — Assessment & Plan Note (Signed)
D/w pt about diet and exercise, weight.  

## 2011-07-06 ENCOUNTER — Other Ambulatory Visit: Payer: Self-pay | Admitting: *Deleted

## 2011-07-06 ENCOUNTER — Other Ambulatory Visit: Payer: Self-pay | Admitting: Family Medicine

## 2011-07-06 MED ORDER — LEVOTHYROXINE SODIUM 100 MCG PO TABS: ORAL_TABLET | ORAL | Status: DC

## 2011-07-08 ENCOUNTER — Other Ambulatory Visit: Payer: Self-pay | Admitting: Family Medicine

## 2011-07-19 ENCOUNTER — Telehealth: Payer: Self-pay | Admitting: Family Medicine

## 2011-07-19 ENCOUNTER — Other Ambulatory Visit (INDEPENDENT_AMBULATORY_CARE_PROVIDER_SITE_OTHER): Payer: MEDICARE | Admitting: Family Medicine

## 2011-07-19 DIAGNOSIS — E039 Hypothyroidism, unspecified: Secondary | ICD-10-CM

## 2011-07-19 NOTE — Telephone Encounter (Signed)
Please call pt.  TSH now wnl, continue as is with current dose.

## 2011-07-19 NOTE — Telephone Encounter (Signed)
Reported on [place].

## 2011-09-19 ENCOUNTER — Ambulatory Visit (INDEPENDENT_AMBULATORY_CARE_PROVIDER_SITE_OTHER)
Admission: RE | Admit: 2011-09-19 | Discharge: 2011-09-19 | Disposition: A | Discharge status: Home / Self Care | Payer: MEDICARE | Source: Ambulatory Visit | Attending: Family Medicine | Admitting: Family Medicine

## 2011-09-19 ENCOUNTER — Encounter: Payer: Self-pay | Admitting: Family Medicine

## 2011-09-19 ENCOUNTER — Ambulatory Visit (INDEPENDENT_AMBULATORY_CARE_PROVIDER_SITE_OTHER): Payer: MEDICARE | Admitting: Family Medicine

## 2011-09-19 ENCOUNTER — Ambulatory Visit: Payer: MEDICARE | Admitting: Family Medicine

## 2011-09-19 VITALS — BP 140/90 | HR 98 | Temp 98.0°F | Ht 63.0 in | Wt 198.8 lb

## 2011-09-19 DIAGNOSIS — M25559 Pain in unspecified hip: Secondary | ICD-10-CM

## 2011-09-19 DIAGNOSIS — M25552 Pain in left hip: Secondary | ICD-10-CM

## 2011-09-19 DIAGNOSIS — IMO0002 Reserved for concepts with insufficient information to code with codable children: Secondary | ICD-10-CM

## 2011-09-19 MED ORDER — TIZANIDINE HCL 4 MG PO TABS: 4.0000 mg | ORAL_TABLET | Freq: Every evening | ORAL | Status: DC

## 2011-09-19 MED ORDER — MELOXICAM 15 MG PO TABS: 15.0000 mg | ORAL_TABLET | Freq: Every day | ORAL | Status: DC

## 2011-09-19 NOTE — Patient Instructions (Signed)
After you have an injection of any joint, the numbing medicine will make it feel better for a few hours. Later tonight, it is common for the joint to feel worse. The steroid will take 48-72 hours to start working - it is the thing that will likely provide the most relief.  Ice the joint where you had the injection at least 2-3 times a day for 20 minutes for 3 days. If have had swelling, pain in the joint itself, ice for 1 week.  You can use an ice bag, frozen peas or corn, an ice pack - all work  

## 2011-09-19 NOTE — Progress Notes (Signed)
  Subjective:    Patient ID: Kelly Kennedy, female    DOB: 1941-05-13, 70 y.o.   MRN: [id number]  HPI  Kelly Kennedy, a 70 y.o. female presents today in the office for the following:    Pleasant patient mostly left lateral hip pain for the last week. She has no known injury. She does have a history of some back problems and has had one episode of radiculopathy in the past. She denies any groin pain. No bowel or bladder incontinence. Good range of motion. Just try some stretching at home as well as some liniment without any significant improvement in her symptoms.  L Hip pain: Pain lateral hip. Buttocks and lateral leg.  1 week. Has tried some linament and that has not helped at all.   The PMH, PSH, Social History, Family History, Medications, and allergies have been reviewed in [facility], and have been updated if relevant.   Review of Systems REVIEW OF SYSTEMS  GEN: No fevers, chills. Nontoxic. Primarily MSK c/o today. MSK: Detailed in the HPI GI: tolerating PO intake without difficulty Neuro: No numbness, parasthesias, or tingling associated. Otherwise the pertinent positives of the ROS are noted above.      Objective:   Physical Exam   Physical Exam  Blood pressure 140/90, pulse 98, temperature 98 F (36.7 C), temperature source Oral, height 5\' 3"  (1.6 m), weight 198 lb 12.8 oz (90.175 kg), SpO2 100.00%.  GEN: Well-developed,well-nourished,in no acute distress; alert,appropriate and cooperative throughout examination HEENT: Normocephalic and atraumatic without obvious abnormalities. Ears, externally no deformities PULM: Breathing comfortably in no respiratory distress EXT: No clubbing, cyanosis, or edema PSYCH: Normally interactive. Cooperative during the interview. Pleasant. Friendly and conversant. Not anxious or depressed appearing. Normal, full affect.  HIP EXAM: SIDE: L ROM: Abduction, Flexion, Internal and External range of motion: excellent Pain with terminal IROM  and EROM: no GTB: TTP SLR: NEG Knees: No effusion FABER: NT REVERSE FABER: NT, neg Piriformis: NT at direct palpation Str: flexion: 5/5 abduction: 5/5 adduction: 4+/5 Strength testing mildly tender with abduction        Assessment & Plan:   1. Hip pain, left  DG Hip Complete Left, DG Lumbar Spine Complete, meloxicam (MOBIC) 15 MG tablet, tiZANidine (ZANAFLEX) 4 MG tablet  2. BACK PAIN WITH RADICULOPATHY  DG Hip Complete Left, DG Lumbar Spine Complete, meloxicam (MOBIC) 15 MG tablet, tiZANidine (ZANAFLEX) 4 MG tablet    Primarily trochanteric bursitis by history as well as exam. I think there is a significant component coming from her back also, and she does have some significant spondyloarthropathy on x-ray. Should her some basic stretches from [employer] practitioner program, and started medications above. We also discussed doing a trochanteric bursa injection.  Trochanteric Bursitis Injection, L Verbal consent obtained. Risks (including infection), benefits, and alternatives reviewed. Greater trochanter sterilely prepped with Chloraprep. Ethyl Chloride used for anesthesia. 8 cc of Lidocaine 1% injected with 2 cc of 40 mg Depo-Medrol into trochanteric bursa at area of maximal tenderness at greater trochanter. Needle taken to bone to troch bursa, flows easily. Bursa massaged. No bleeding and no complications. Decreased pain after injection. Needle: 22 gauge spinal needle

## 2011-10-11 ENCOUNTER — Ambulatory Visit (INDEPENDENT_AMBULATORY_CARE_PROVIDER_SITE_OTHER): Payer: Medicare Other | Admitting: Family Medicine

## 2011-10-11 ENCOUNTER — Encounter: Payer: Self-pay | Admitting: Family Medicine

## 2011-10-11 VITALS — BP 130/74 | HR 104 | Temp 98.6°F | Ht 63.0 in | Wt 199.0 lb

## 2011-10-11 DIAGNOSIS — M7062 Trochanteric bursitis, left hip: Secondary | ICD-10-CM

## 2011-10-11 DIAGNOSIS — M76899 Other specified enthesopathies of unspecified lower limb, excluding foot: Secondary | ICD-10-CM

## 2011-10-11 DIAGNOSIS — Z23 Encounter for immunization: Secondary | ICD-10-CM

## 2011-10-11 NOTE — Progress Notes (Signed)
  Subjective:    Patient ID: Kelly Kennedy, female    DOB: 07/28/1941, 70 y.o.   MRN: [id number]  HPI  Kelly Kennedy, a 70 y.o. female presents today in the office for the following:    F/u L GTB, longstanding h/o lbp. Acutely flared up GTB. Doing well, s/p GTB inj. Minimally compliant with HEP given to her. Has not restarted walking Sit-ups  The PMH, PSH, Social History, Family History, Medications, and allergies have been reviewed in [facility], and have been updated if relevant.   Review of Systems REVIEW OF SYSTEMS  GEN: No fevers, chills. Nontoxic. Primarily MSK c/o today. MSK: Detailed in the HPI GI: tolerating PO intake without difficulty Neuro: No numbness, parasthesias, or tingling associated. Otherwise the pertinent positives of the ROS are noted above.      Objective:   Physical Exam   Physical Exam  Blood pressure 130/74, pulse 104, temperature 98.6 F (37 C), temperature source Oral, height 5\' 3"  (1.6 m), weight 199 lb (90.266 kg), SpO2 98.00%.  GEN: Well-developed,well-nourished,in no acute distress; alert,appropriate and cooperative throughout examination HEENT: Normocephalic and atraumatic without obvious abnormalities. Ears, externally no deformities PULM: Breathing comfortably in no respiratory distress EXT: No clubbing, cyanosis, or edema PSYCH: Normally interactive. Cooperative during the interview. Pleasant. Friendly and conversant. Not anxious or depressed appearing. Normal, full affect.  HIP EXAM: SIDE: L ROM: Abduction, Flexion, Internal and External range of motion: Pain with terminal IROM and EROM: no GTB: NT SLR: NEG Knees: No effusion FABER: NT REVERSE FABER: NT, neg Str: flexion: 5/5 abduction: 4+/5 on L adduction: 5/5 Strength testing non-tender         Assessment & Plan:   GTB, improved Cont with hip abductions - simplify -- do only 1 exercise once a day F/u prn Ok to begin walking

## 2011-12-02 ENCOUNTER — Other Ambulatory Visit: Payer: Self-pay | Admitting: Family Medicine

## 2011-12-02 DIAGNOSIS — Z1231 Encounter for screening mammogram for malignant neoplasm of breast: Secondary | ICD-10-CM

## 2011-12-21 ENCOUNTER — Ambulatory Visit
Admission: RE | Admit: 2011-12-21 | Discharge: 2011-12-21 | Disposition: A | Discharge status: Home / Self Care | Payer: Medicare Other | Source: Ambulatory Visit | Attending: Family Medicine | Admitting: Family Medicine

## 2011-12-21 DIAGNOSIS — Z1231 Encounter for screening mammogram for malignant neoplasm of breast: Secondary | ICD-10-CM

## 2011-12-28 ENCOUNTER — Encounter: Payer: Self-pay | Admitting: *Deleted

## 2012-01-25 ENCOUNTER — Other Ambulatory Visit: Payer: Self-pay | Admitting: Family Medicine

## 2012-01-25 NOTE — Telephone Encounter (Signed)
Received refill request electronically from pharmacy. Chart shows that patient takes one daily except 1/2 on Monday. Called patient and she confirmed this. Patient states that the script goes in as one daily and she know to make the change just on Monday. Rx refilled as received from pharmacy.

## 2012-01-27 ENCOUNTER — Other Ambulatory Visit: Payer: Self-pay | Admitting: Family Medicine

## 2012-01-29 NOTE — Telephone Encounter (Signed)
Sent!

## 2012-03-24 ENCOUNTER — Other Ambulatory Visit: Payer: Self-pay | Admitting: Family Medicine

## 2012-03-26 NOTE — Telephone Encounter (Signed)
Sent!

## 2012-05-17 ENCOUNTER — Other Ambulatory Visit: Payer: Self-pay | Admitting: Family Medicine

## 2012-05-17 DIAGNOSIS — E039 Hypothyroidism, unspecified: Secondary | ICD-10-CM

## 2012-05-17 DIAGNOSIS — I10 Essential (primary) hypertension: Secondary | ICD-10-CM

## 2012-05-21 ENCOUNTER — Other Ambulatory Visit: Payer: MEDICARE

## 2012-05-25 ENCOUNTER — Encounter: Payer: Self-pay | Admitting: Family Medicine

## 2012-05-25 ENCOUNTER — Ambulatory Visit (INDEPENDENT_AMBULATORY_CARE_PROVIDER_SITE_OTHER): Payer: Medicare Other | Admitting: Family Medicine

## 2012-05-25 VITALS — BP 120/66 | HR 96 | Temp 97.9°F | Ht 63.0 in | Wt 189.0 lb

## 2012-05-25 DIAGNOSIS — K120 Recurrent oral aphthae: Secondary | ICD-10-CM

## 2012-05-25 DIAGNOSIS — E782 Mixed hyperlipidemia: Secondary | ICD-10-CM

## 2012-05-25 DIAGNOSIS — Z0000 Encounter for general adult medical examination without abnormal findings: Secondary | ICD-10-CM

## 2012-05-25 DIAGNOSIS — R7309 Other abnormal glucose: Secondary | ICD-10-CM

## 2012-05-25 DIAGNOSIS — Z9289 Personal history of other medical treatment: Secondary | ICD-10-CM

## 2012-05-25 DIAGNOSIS — E039 Hypothyroidism, unspecified: Secondary | ICD-10-CM

## 2012-05-25 DIAGNOSIS — N951 Menopausal and female climacteric states: Secondary | ICD-10-CM

## 2012-05-25 DIAGNOSIS — I10 Essential (primary) hypertension: Secondary | ICD-10-CM

## 2012-05-25 DIAGNOSIS — Z9189 Other specified personal risk factors, not elsewhere classified: Secondary | ICD-10-CM

## 2012-05-25 DIAGNOSIS — R609 Edema, unspecified: Secondary | ICD-10-CM

## 2012-05-25 DIAGNOSIS — G4700 Insomnia, unspecified: Secondary | ICD-10-CM

## 2012-05-25 LAB — LIPID PANEL
HDL: 55.6 mg/dL (ref >39.00)
Triglycerides: 166 mg/dL — ABNORMAL HIGH (ref 0.0–149.0)
VLDL: 33.2 mg/dL (ref 0.0–40.0)

## 2012-05-25 LAB — COMPREHENSIVE METABOLIC PANEL
Alkaline Phosphatase: 50 U/L (ref 39–117)
CO2: 26 mEq/L (ref 19–32)
Creatinine, Ser: 1 mg/dL (ref 0.4–1.2)
GFR: 68.84 mL/min (ref >60.00)
Glucose, Bld: 89 mg/dL (ref 70–99)
Total Bilirubin: 0.4 mg/dL (ref 0.3–1.2)

## 2012-05-25 LAB — TSH: TSH: 0.15 u[IU]/mL — ABNORMAL LOW (ref 0.35–5.50)

## 2012-05-25 MED ORDER — LIDOCAINE VISCOUS 2 % MT SOLN: 5.0000 mL | OROMUCOSAL | Status: AC | PRN

## 2012-05-25 NOTE — Patient Instructions (Addendum)
Check with your insurance to see if they will cover the shingles shot. Use the lidocaine for the sores in your mouth.  Go to the lab on the way out.  We'll contact you with your lab report. Take care.  Glad to see you.

## 2012-05-25 NOTE — Progress Notes (Signed)
Saw Eye Doctor in April 2013.  Ninetta Lights, CMA  I have personally reviewed the Medicare Annual Wellness questionnaire and have noted 1. The patient's medical and social history 2. Their use of alcohol, tobacco or illicit drugs 3. Their current medications and supplements 4. The patient's functional ability including ADL's, fall risks, home safety risks and hearing or visual             impairment. 5. Diet and physical activities 6. Evidence for depression or mood disorders  The patients weight, height, BMI have been recorded in the chart and visual acuity is per eye clinic.  I have made referrals, counseling and provided education to the patient based review of the above and I have provided the pt with a written personalized care plan for preventive services.  See scanned forms.  Routine anticipatory guidance given to patient.  See health maintenance. Tetanus 2008 Flu done yearly Shingles encouraged PNA 2012 Colon cancer screening 2011 Mammogram 11/12 Advance directive d/w pt.  She'll consider.   Hypertension:    Using medication without problems or lightheadedness: yes Chest pain with exertion:no Edema: occ mild foot edema, B Short of breath:no Average home BPs:  Elevated Cholesterol: Using medications without problems:yes Muscle aches: no Diet compliance: "pretty good." Exercise: walking daily.    Hypothyroid.  Compliant with meds.  No neck mass or pain.  Due for TSH.    HRT.  D/w pt.  We discussed taper.  She wants to continue.  Didn't tolerate hot flashes prev.    Mouth is breaking out, internally.  On lips and tongue, burning pain.  Gargling with peroxide doesn't help much.  Has happened prev.    PMH and SH reviewed  Meds, vitals, and allergies reviewed.   ROS: See HPI.  Otherwise negative.    GEN: nad, alert and oriented HEENT: mucous membranes moist with mult aphthous ulcers on the lower lip, tm wnl B NECK: supple w/o LA CV: rrr. PULM: ctab, no inc wob ABD:  soft, +bs EXT: no edema SKIN: no acute rash

## 2012-05-27 ENCOUNTER — Encounter: Payer: Self-pay | Admitting: Family Medicine

## 2012-05-27 DIAGNOSIS — K120 Recurrent oral aphthae: Secondary | ICD-10-CM | POA: Insufficient documentation

## 2012-05-27 DIAGNOSIS — Z0000 Encounter for general adult medical examination without abnormal findings: Secondary | ICD-10-CM | POA: Insufficient documentation

## 2012-05-27 MED ORDER — LEVOTHYROXINE SODIUM 100 MCG PO TABS: ORAL_TABLET | ORAL | Status: DC

## 2012-05-27 NOTE — Assessment & Plan Note (Signed)
Continue current meds 

## 2012-05-27 NOTE — Assessment & Plan Note (Signed)
Topical tx and should resolve. F/u prn.

## 2012-05-27 NOTE — Assessment & Plan Note (Signed)
Over replaced, will dec dose and recheck in 8 weeks.  See notes on labs.

## 2012-05-27 NOTE — Assessment & Plan Note (Signed)
Elevate at needed.

## 2012-05-27 NOTE — Assessment & Plan Note (Signed)
D/w pt about taper.  She declined.  Is aware of risks with continued tx.

## 2012-05-27 NOTE — Assessment & Plan Note (Signed)
No change in meds, continue work on weight.

## 2012-05-27 NOTE — Assessment & Plan Note (Signed)
H/o, will follow.

## 2012-07-22 ENCOUNTER — Other Ambulatory Visit: Payer: Self-pay | Admitting: Family Medicine

## 2012-07-23 ENCOUNTER — Other Ambulatory Visit: Payer: Self-pay | Admitting: Family Medicine

## 2012-07-23 NOTE — Telephone Encounter (Signed)
Received refill request electronically from pharmacy. Last office visit 05/25/12. Is it okay to refill medication?

## 2012-07-23 NOTE — Telephone Encounter (Signed)
Sent!

## 2012-07-24 ENCOUNTER — Other Ambulatory Visit (INDEPENDENT_AMBULATORY_CARE_PROVIDER_SITE_OTHER): Payer: Medicare Other

## 2012-07-24 ENCOUNTER — Other Ambulatory Visit: Payer: Self-pay | Admitting: Family Medicine

## 2012-07-24 DIAGNOSIS — E039 Hypothyroidism, unspecified: Secondary | ICD-10-CM

## 2012-07-24 LAB — TSH: TSH: 1.04 u[IU]/mL (ref 0.35–5.50)

## 2012-09-27 ENCOUNTER — Ambulatory Visit (INDEPENDENT_AMBULATORY_CARE_PROVIDER_SITE_OTHER): Payer: Medicare Other

## 2012-09-27 DIAGNOSIS — Z23 Encounter for immunization: Secondary | ICD-10-CM

## 2012-10-08 ENCOUNTER — Encounter: Payer: Self-pay | Admitting: Family Medicine

## 2012-10-08 LAB — TSH
Creat: 0.79
Hemoglobin: 12.8 g/dL (ref 12.0–16.0)
TSH: 0.466

## 2012-10-08 LAB — LIPID PANEL
HDL: 46 mg/dL (ref 35–70)
LDL Cholesterol: 70 mg/dL

## 2013-03-13 ENCOUNTER — Other Ambulatory Visit: Payer: Self-pay

## 2013-03-13 DIAGNOSIS — Z1231 Encounter for screening mammogram for malignant neoplasm of breast: Secondary | ICD-10-CM

## 2013-03-22 ENCOUNTER — Other Ambulatory Visit: Payer: Self-pay | Admitting: Family Medicine

## 2013-03-22 ENCOUNTER — Ambulatory Visit
Admission: RE | Admit: 2013-03-22 | Discharge: 2013-03-22 | Disposition: A | Discharge status: Home / Self Care | Payer: Medicare Other | Source: Ambulatory Visit

## 2013-03-22 DIAGNOSIS — Z1231 Encounter for screening mammogram for malignant neoplasm of breast: Secondary | ICD-10-CM

## 2013-03-22 DIAGNOSIS — R928 Other abnormal and inconclusive findings on diagnostic imaging of breast: Secondary | ICD-10-CM

## 2013-03-31 ENCOUNTER — Other Ambulatory Visit: Payer: Self-pay | Admitting: Family Medicine

## 2013-04-04 ENCOUNTER — Other Ambulatory Visit: Payer: Self-pay | Admitting: Family Medicine

## 2013-04-04 ENCOUNTER — Ambulatory Visit
Admission: RE | Admit: 2013-04-04 | Discharge: 2013-04-04 | Disposition: A | Discharge status: Home / Self Care | Payer: Medicare Other | Source: Ambulatory Visit | Attending: Family Medicine | Admitting: Family Medicine

## 2013-04-04 ENCOUNTER — Encounter: Payer: Self-pay | Admitting: *Deleted

## 2013-04-04 DIAGNOSIS — R928 Other abnormal and inconclusive findings on diagnostic imaging of breast: Secondary | ICD-10-CM

## 2013-04-25 ENCOUNTER — Ambulatory Visit (INDEPENDENT_AMBULATORY_CARE_PROVIDER_SITE_OTHER): Payer: Medicare Other | Admitting: Family Medicine

## 2013-04-25 ENCOUNTER — Encounter: Payer: Self-pay | Admitting: Family Medicine

## 2013-04-25 VITALS — BP 152/90 | HR 98 | Temp 98.2°F | Wt 181.2 lb

## 2013-04-25 DIAGNOSIS — M1712 Unilateral primary osteoarthritis, left knee: Secondary | ICD-10-CM

## 2013-04-25 DIAGNOSIS — M25561 Pain in right knee: Secondary | ICD-10-CM

## 2013-04-25 DIAGNOSIS — M25569 Pain in unspecified knee: Secondary | ICD-10-CM

## 2013-04-25 DIAGNOSIS — M25562 Pain in left knee: Secondary | ICD-10-CM

## 2013-04-25 DIAGNOSIS — M25579 Pain in unspecified ankle and joints of unspecified foot: Secondary | ICD-10-CM

## 2013-04-25 DIAGNOSIS — M7750 Other enthesopathy of unspecified foot: Secondary | ICD-10-CM

## 2013-04-25 NOTE — Progress Notes (Signed)
Nature conservation officer at [facility] [address] [place] [address] Phone: [phone] Fax: [phone]  Date:  04/25/2013   Name:  Kelly Kennedy   DOB:  07-30-1941   MRN:  [id number] Gender: female Age: 72 y.o.  Primary Physician:  Crawford Givens, MD  Evaluating MD: Hannah Beat, MD   Chief Complaint: Joint Swelling   History of Present Illness:  Kelly Kennedy is a 71 y.o. pleasant patient who presents with the following:  Ankles and knees. Started about a week ago. Hurts really bad. 6/10. No injury known. ? If hurt L knee when tilling garden a few weeks ago. Ultimately, it is her left knee that hurts the most, but she also c/o R knee pain, pes bursitis, ankle pain, swelling and puffiness centered around the sinus tarsi region.   Other than above noted ? L knee injury, no other known trauma or accident  OA Pes bursitis B Sinus tarsi syndrome  Sports insoles  L knee inj  Patient Active Problem List   Diagnosis Date Noted  . Aphthous ulcer 05/27/2012  . Medicare annual wellness visit, initial 05/27/2012  . History of bone density study 05/20/2011  . MENOPAUSAL SYNDROME 06/15/2010  . INSOMNIA 06/15/2010  . HYPERGLYCEMIA 09/19/2008  . GERD 09/16/2008  . HYPOTHYROIDISM 08/02/2007  . DEPENDENT EDEMA 08/02/2007  . HYPERLIPIDEMIA, MIXED 07/17/2007  . SYSTOLIC MURMUR 07/17/2007  . ANGINA, HX OF 07/17/2007  . NICOTINE ADDICTION, IN REMISSION 07/17/2007  . HIATAL HERNIA 06/28/2005  . DIVERTICULOSIS, COLON W/O HEM 06/28/2005  . HEMORRHOIDS NOS, W/O COMPLICATIONS 10/28/2001  . COLONIC POLYPS 06/28/1998    Past Medical History  Diagnosis Date  . Hyperlipidemia   . Diverticulosis of colon 06/2005  . Hypothyroid   . Insomnia   . Menopausal symptoms   . Colon polyps     colonoscopy 8/99, 12/02, 8/06  . Hypertension   . Hyperglycemia     Past Surgical History  Procedure Laterality Date  . Partial hysterectomy      ovaries intact  .  Cholecystectomy  1990  . Ectopic pregnancy surgery  1971  . Cystectomy  05/04/08    left lower arm, benign    History   Social History  . Marital Status: Married    Spouse Name: [name]    Number of Children: 3  . Years of Education: [name]   Occupational History  . retired, previous Scientist, product/process development    Social History Main Topics  . Smoking status: Former Games developer  . Smokeless tobacco: Not on file  . Alcohol Use: No  . Drug Use: No  . Sexually Active: Not on file   Other Topics Concern  . Not on file   Social History Narrative   From [place].   3 children, one died from MI in 2005/04/15   Married 1959/04/16    Family History  Problem Relation Age of Onset  . Cancer Father     stomach   . Heart disease Son     MI  . Arthritis Mother   . Diabetes Mother     Allergies  Allergen Reactions  . Niacin     REACTION: rash    Medication list has been reviewed and updated.  Outpatient Prescriptions Prior to Visit  Medication Sig Dispense Refill  . amitriptyline (ELAVIL) 50 MG tablet TAKE 1 TO 2 TABLETS BY MOUTH AT BEDTIME  60 tablet  12  . Carboxymethylcell-Hypromellose (GENTEAL) 0.25-0.3 % GEL Apply to eye.      Marland Kitchen  carboxymethylcellulose (REFRESH PLUS) 0.5 % SOLN 1 drop 3 (three) times daily as needed.      . CRESTOR 10 MG tablet TAKE 1 TABLET EVERY DAY  30 tablet  6  . estradiol (CLIMARA - DOSED IN MG/24 HR) 0.1 mg/24hr Place 1 patch onto the skin once a week.        . fluocinonide (LIDEX) 0.05 % cream As needed.       . furosemide (LASIX) 20 MG tablet Take 20 mg by mouth daily.        Marland Kitchen ibuprofen (ADVIL,MOTRIN) 800 MG tablet Take one every 8 hours as needed for back pain, take with food       . levothyroxine (SYNTHROID, LEVOTHROID) 100 MCG tablet Take one tablet by mouth daily except on Monday and Wednesday take 1/2 tablet      . levothyroxine (SYNTHROID, LEVOTHROID) 100 MCG tablet TAKE ONE BY MOUTH DAILY  30 tablet  3  . lisinopril (PRINIVIL,ZESTRIL) 40 MG tablet Take 40 mg by  mouth daily.      . meloxicam (MOBIC) 15 MG tablet TAKE 1 TABLET (15 MG TOTAL) BY MOUTH DAILY.  30 tablet  2  . Multiple Vitamins-Minerals (OCUVITE PRESERVISION) TABS Take one tablet by mouth daily       . nitroGLYCERIN (NITRO-DUR) 0.4 mg/hr As needed       . tretinoin (RETIN-A) 0.025 % cream Apply as needed       . NON FORMULARY Lastacraft .25%       No facility-administered medications prior to visit.    Review of Systems:   GEN: No fevers, chills. Nontoxic. Primarily MSK c/o today. MSK: Detailed in the HPI GI: tolerating PO intake without difficulty Neuro: No numbness, parasthesias, or tingling associated. Otherwise the pertinent positives of the ROS are noted above.    Physical Examination: BP 152/90  Pulse 98  Temp(Src) 98.2 F (36.8 C) (Oral)  Wt 181 lb 4 oz (82.214 kg)  BMI 32.11 kg/m2  SpO2 96%  Ideal Body Weight:     GEN: WDWN, NAD, Non-toxic, Alert & Oriented x 3 HEENT: Atraumatic, Normocephalic.  Ears and Nose: No external deformity. EXTR: No clubbing/cyanosis/edema PSYCH: Normally interactive. Conversant. Not depressed or anxious appearing.  Calm demeanor.   Knee:  L ROM: 0-115 Effusion: mild Echymosis or edema: none Patellar tendon NT Painful PLICA: neg Patellar grind: negative Medial and lateral patellar facet loading: negative medial and lateral joint lines: mild medial and lateral joint pain Mcmurray's neg Flexion-pinch neg Varus and valgus stress: stable Lachman: neg Ant and Post drawer: neg Hip abduction, IR, ER: WNL Hip flexion str: 5/5 Hip abd: 5/5 Quad: 5/5 VMO atrophy: mild Hamstring concentric and eccentric: 5/5   B foot breakdown with notable puffiness in the sinus tarsi region. NT true ankle joint. O/w bony foot and ankle anatomy NT  Assessment and Plan:  Arthritis of left knee  Left knee pain  Right knee pain  Sinus tarsi syndrome, unspecified laterality  Attempt to help pes planus and sinus tarsi with more support.  Sports insoles.  Multifactorial, B pes bursitis, B arthritis, L knee exacerbation most severe now - try to inject L knee to calm down.  Knee Injection, L Patient verbally consented to procedure. Risks (including potential rare risk of infection), benefits, and alternatives explained. Sterilely prepped with Chloraprep. Ethyl cholride used for anesthesia. 8 cc Lidocaine 1% mixed with 2 cc of Depo-Medrol 40 mg injected using the anterolateral approach without difficulty. No complications with procedure and tolerated  well. Patient had decreased pain post-injection.   Orders Today:  No orders of the defined types were placed in this encounter.    Updated Medication List: (Includes new medications, updates to list, dose adjustments) No orders of the defined types were placed in this encounter.    Medications Discontinued: Medications Discontinued During This Encounter  Medication Reason  . NON FORMULARY Patient Preference      Signed, Carols Clemence T. Klyn Kroening, MD 04/25/2013 10:14 AM

## 2013-05-07 ENCOUNTER — Other Ambulatory Visit: Payer: Self-pay | Admitting: Family Medicine

## 2013-05-07 NOTE — Telephone Encounter (Signed)
Electronic refill request.  Please advise. 

## 2013-05-08 NOTE — Telephone Encounter (Signed)
Sent. Thanks.   

## 2013-06-16 ENCOUNTER — Other Ambulatory Visit: Payer: Self-pay | Admitting: Family Medicine

## 2013-06-16 DIAGNOSIS — I10 Essential (primary) hypertension: Secondary | ICD-10-CM

## 2013-06-16 DIAGNOSIS — E039 Hypothyroidism, unspecified: Secondary | ICD-10-CM

## 2013-06-26 ENCOUNTER — Other Ambulatory Visit (INDEPENDENT_AMBULATORY_CARE_PROVIDER_SITE_OTHER): Payer: Medicare Other

## 2013-06-26 DIAGNOSIS — E039 Hypothyroidism, unspecified: Secondary | ICD-10-CM

## 2013-06-26 DIAGNOSIS — I10 Essential (primary) hypertension: Secondary | ICD-10-CM

## 2013-06-26 LAB — COMPREHENSIVE METABOLIC PANEL
ALT: 28 U/L (ref 0–35)
BUN: 11 mg/dL (ref 6–23)
CO2: 26 mEq/L (ref 19–32)
Calcium: 9.7 mg/dL (ref 8.4–10.5)
Chloride: 106 mEq/L (ref 96–112)
Creatinine, Ser: 0.7 mg/dL (ref 0.4–1.2)
GFR: 104.25 mL/min (ref >60.00)
Glucose, Bld: 113 mg/dL — ABNORMAL HIGH (ref 70–99)

## 2013-06-26 LAB — LIPID PANEL
Cholesterol: 169 mg/dL (ref 0–200)
HDL: 57.7 mg/dL (ref >39.00)

## 2013-06-26 LAB — TSH: TSH: 0.71 u[IU]/mL (ref 0.35–5.50)

## 2013-07-02 ENCOUNTER — Encounter: Payer: Self-pay | Admitting: Gastroenterology

## 2013-07-02 ENCOUNTER — Encounter: Payer: Self-pay | Admitting: Family Medicine

## 2013-07-02 ENCOUNTER — Ambulatory Visit (INDEPENDENT_AMBULATORY_CARE_PROVIDER_SITE_OTHER): Payer: Medicare Other | Admitting: Family Medicine

## 2013-07-02 VITALS — BP 116/66 | HR 88 | Temp 97.6°F | Ht 63.0 in | Wt 183.2 lb

## 2013-07-02 DIAGNOSIS — Z0000 Encounter for general adult medical examination without abnormal findings: Secondary | ICD-10-CM

## 2013-07-02 DIAGNOSIS — E669 Obesity, unspecified: Secondary | ICD-10-CM

## 2013-07-02 DIAGNOSIS — E039 Hypothyroidism, unspecified: Secondary | ICD-10-CM

## 2013-07-02 DIAGNOSIS — G4700 Insomnia, unspecified: Secondary | ICD-10-CM

## 2013-07-02 DIAGNOSIS — Z9289 Personal history of other medical treatment: Secondary | ICD-10-CM

## 2013-07-02 DIAGNOSIS — R7309 Other abnormal glucose: Secondary | ICD-10-CM

## 2013-07-02 DIAGNOSIS — F411 Generalized anxiety disorder: Secondary | ICD-10-CM

## 2013-07-02 DIAGNOSIS — R0789 Other chest pain: Secondary | ICD-10-CM

## 2013-07-02 DIAGNOSIS — Z1211 Encounter for screening for malignant neoplasm of colon: Secondary | ICD-10-CM

## 2013-07-02 DIAGNOSIS — E782 Mixed hyperlipidemia: Secondary | ICD-10-CM

## 2013-07-02 MED ORDER — ESTRADIOL 0.1 MG/24HR TD PTWK: 1.0000 | MEDICATED_PATCH | TRANSDERMAL | Status: DC

## 2013-07-02 MED ORDER — LEVOTHYROXINE SODIUM 100 MCG PO TABS: ORAL_TABLET | ORAL | Status: DC

## 2013-07-02 MED ORDER — TRETINOIN 0.025 % EX CREA: TOPICAL_CREAM | Freq: Every day | CUTANEOUS | Status: DC

## 2013-07-02 MED ORDER — FLUOCINONIDE 0.05 % EX CREA: TOPICAL_CREAM | Freq: Every day | CUTANEOUS | Status: DC

## 2013-07-02 MED ORDER — ROSUVASTATIN CALCIUM 10 MG PO TABS: 10.0000 mg | ORAL_TABLET | Freq: Every day | ORAL | Status: DC

## 2013-07-02 MED ORDER — FUROSEMIDE 20 MG PO TABS: 20.0000 mg | ORAL_TABLET | Freq: Every day | ORAL | Status: DC

## 2013-07-02 MED ORDER — LISINOPRIL 40 MG PO TABS: 40.0000 mg | ORAL_TABLET | Freq: Every day | ORAL | Status: DC

## 2013-07-02 MED ORDER — NITROGLYCERIN 0.4 MG SL SUBL: SUBLINGUAL_TABLET | SUBLINGUAL | Status: DC

## 2013-07-02 MED ORDER — AMITRIPTYLINE HCL 50 MG PO TABS: ORAL_TABLET | ORAL | Status: DC

## 2013-07-02 MED ORDER — MELOXICAM 15 MG PO TABS: ORAL_TABLET | ORAL | Status: DC

## 2013-07-02 NOTE — Patient Instructions (Addendum)
See Shirlee Limerick about your referral before you leave today.  Check with your insurance to see if they will cover the shingles shot. Try the higher dose of amitriptyline at night and see if that helps. If it doesn't, then let me know.

## 2013-07-03 ENCOUNTER — Encounter: Payer: Self-pay | Admitting: Family Medicine

## 2013-07-03 DIAGNOSIS — E669 Obesity, unspecified: Secondary | ICD-10-CM | POA: Insufficient documentation

## 2013-07-03 DIAGNOSIS — F411 Generalized anxiety disorder: Secondary | ICD-10-CM | POA: Insufficient documentation

## 2013-07-03 DIAGNOSIS — R0789 Other chest pain: Secondary | ICD-10-CM | POA: Insufficient documentation

## 2013-07-03 NOTE — Assessment & Plan Note (Signed)
See scanned forms.  Routine anticipatory guidance given to patient.  See health maintenance. Flu 2013 Shingles d/w pt PNA 2012 Tetanus 2008 Colonoscopy referral in.  Breast cancer screening dw/ pt.  mammo done 2014 DXA d/w pt.  Agreed to defer for now, consider recheck in 2015 Advance directive d/w pt.  would have daughter designated if incapacitated.  Cognitive function addressed- see scanned forms- and if abnormal then additional documentation follows.

## 2013-07-03 NOTE — Assessment & Plan Note (Signed)
Likely related to social upheaval.  EKG wnl and she has no exertional sx.  Should it continue, she'll f/u with cards. Discussed.  In meantime, inc TCA at night and refer for counseling.  She agrees. Okay for outpatient f/u.

## 2013-07-03 NOTE — Assessment & Plan Note (Signed)
TSH wnl, continue as is. No TMG on exam.  

## 2013-07-03 NOTE — Assessment & Plan Note (Signed)
See anxiety discussion.

## 2013-07-03 NOTE — Progress Notes (Signed)
I have personally reviewed the Medicare Annual Wellness questionnaire and have noted 1. The patient's medical and social history 2. Their use of alcohol, tobacco or illicit drugs 3. Their current medications and supplements 4. The patient's functional ability including ADL's, fall risks, home safety risks and hearing or visual             impairment. 5. Diet and physical activities 6. Evidence for depression or mood disorders  The patients weight, height, BMI have been recorded in the chart and visual acuity is per eye clinic.  I have made referrals, counseling and provided education to the patient based review of the above and I have provided the pt with a written personalized care plan for preventive services.  See scanned forms.  Routine anticipatory guidance given to patient.  See health maintenance. Flu 2013 Shingles d/w pt PNA 2012 Tetanus 2008 Colonoscopy referral in.  Breast cancer screening dw/ pt.  mammo done 2014 DXA d/w pt.  Agreed to defer for now, consider recheck in 2015 Advance directive d/w pt.  would have daughter designated if incapacitated.  Cognitive function addressed- see scanned forms- and if abnormal then additional documentation follows.   HRT.  D/w pt about risk/benefit.  Still with sweats and hot flashes is stopped. She tried taper.  Wants to continue.  Discussed.   Knee pain improved after injection per Dr. Patsy Lager.   CP but not with exertion.  At rest. No SOB, no BLE edema.  Sig anxiety and this is likely related.  Husband and brother both died in the same week last year and she is living along now.  Sig upheaval.  No SI/HI.  Contracts for safety.  Insomnia noted, less help with TCA recently.  She would consider counseling.    Hypothyroid. No neck mass.  Compliant with meds.  No ADE.  TSH wnl.   Elevated Cholesterol: Using medications without problems:yes Muscle aches: no Diet compliance:yes Exercise:some  Mild inc in sugar noted. D/w pt about labs,  diet.    PMH and SH reviewed  Meds, vitals, and allergies reviewed.   ROS: See HPI.  Otherwise negative.    GEN: nad, alert and oriented HEENT: mucous membranes moist NECK: supple w/o LA CV: rrr. PULM: ctab, no inc wob ABD: soft, +bs EXT: no edema SKIN: no acute rash

## 2013-07-03 NOTE — Assessment & Plan Note (Signed)
Controlled, continue current meds.   

## 2013-07-03 NOTE — Assessment & Plan Note (Signed)
Inc TCA to 2-3 tabs at night.  See plan re: anxiety.

## 2013-07-03 NOTE — Assessment & Plan Note (Signed)
D/w pt about weight loss via diet.  

## 2013-07-03 NOTE — Assessment & Plan Note (Signed)
D/w pt about weight loss via diet.

## 2013-07-08 ENCOUNTER — Encounter: Payer: Self-pay | Admitting: Family Medicine

## 2013-07-08 ENCOUNTER — Ambulatory Visit (INDEPENDENT_AMBULATORY_CARE_PROVIDER_SITE_OTHER): Payer: Medicare Other | Admitting: Family Medicine

## 2013-07-08 VITALS — BP 132/76 | HR 84 | Temp 98.2°F | Ht 63.0 in | Wt 179.0 lb

## 2013-07-08 DIAGNOSIS — R197 Diarrhea, unspecified: Secondary | ICD-10-CM

## 2013-07-08 MED ORDER — LOSARTAN POTASSIUM 100 MG PO TABS: 100.0000 mg | ORAL_TABLET | Freq: Every day | ORAL | Status: DC

## 2013-07-08 NOTE — Patient Instructions (Addendum)
Drink sips of fluids.  I resent the BP medicine.  Take care.   Try taking 1/2 tab of imodium if needed.

## 2013-07-08 NOTE — Assessment & Plan Note (Signed)
Likely viral colitis, nontoxic, continue hold BP meds and inc fluid intake ie sips of fluids.  She can try imodium.  Nontoxic, but needs to drink more.  She agrees. F/u prn.  Should improve soon.  D/w pt.

## 2013-07-08 NOTE — Progress Notes (Signed)
Recently with diarrhea, lack of appetite.  No fevers.  No abd pain but cramping at time of BMs.  Frequent diarrhea, watery.  No vomiting.  No sick contacts.  No exotic foods.   She has a change in her med list and lisinopril was sent for an ARB.  I apologized and resent the ARB.  D/w pt.  She agreed, understood.  She held her BP meds today.   Meds, vitals, and allergies reviewed.   ROS: See HPI.  Otherwise, noncontributory.  GEN: nad, alert and oriented HEENT: mucous membranes moist NECK: supple w/o LA CV: rrr PULM: ctab, no inc wob ABD: soft, +bs, not ttp EXT: no edema SKIN: no acute rash but slightly delayed skin turgor on the hands, not on the arms.

## 2013-07-17 ENCOUNTER — Ambulatory Visit (INDEPENDENT_AMBULATORY_CARE_PROVIDER_SITE_OTHER): Payer: 59 | Admitting: Psychology

## 2013-07-17 DIAGNOSIS — F4321 Adjustment disorder with depressed mood: Secondary | ICD-10-CM

## 2013-07-24 ENCOUNTER — Ambulatory Visit: Payer: Medicare Other | Admitting: Psychology

## 2013-08-01 ENCOUNTER — Other Ambulatory Visit: Payer: Self-pay | Admitting: Family Medicine

## 2013-08-01 NOTE — Telephone Encounter (Signed)
Sent!

## 2013-08-01 NOTE — Telephone Encounter (Signed)
Electronic refill request, please advise  

## 2013-08-06 ENCOUNTER — Telehealth: Payer: Self-pay

## 2013-08-06 NOTE — Telephone Encounter (Signed)
Pt left v/m that pt has ordered back support; Dr Para March should receive request for back support in next 24 hours.

## 2013-08-07 ENCOUNTER — Ambulatory Visit: Payer: 59 | Admitting: Psychology

## 2013-08-07 NOTE — Telephone Encounter (Signed)
I'll work on the hard copy.  

## 2013-08-20 ENCOUNTER — Ambulatory Visit: Payer: 59 | Admitting: Psychology

## 2013-09-12 ENCOUNTER — Ambulatory Visit (AMBULATORY_SURGERY_CENTER): Payer: Medicare Other

## 2013-09-12 VITALS — Ht 63.0 in | Wt 185.0 lb

## 2013-09-12 DIAGNOSIS — Z860100 Personal history of colon polyps, unspecified: Secondary | ICD-10-CM

## 2013-09-12 MED ORDER — MOVIPREP 100 G PO SOLR: 1.0000 | Freq: Once | ORAL | Status: DC

## 2013-09-13 ENCOUNTER — Encounter: Payer: Self-pay | Admitting: Gastroenterology

## 2013-09-17 ENCOUNTER — Ambulatory Visit (INDEPENDENT_AMBULATORY_CARE_PROVIDER_SITE_OTHER): Payer: 59 | Admitting: Psychology

## 2013-09-17 DIAGNOSIS — F4321 Adjustment disorder with depressed mood: Secondary | ICD-10-CM

## 2013-09-18 ENCOUNTER — Ambulatory Visit (INDEPENDENT_AMBULATORY_CARE_PROVIDER_SITE_OTHER): Payer: Medicare Other

## 2013-09-18 DIAGNOSIS — Z23 Encounter for immunization: Secondary | ICD-10-CM

## 2013-09-23 ENCOUNTER — Telehealth: Payer: Self-pay

## 2013-09-23 NOTE — Telephone Encounter (Signed)
Kelly Kennedy contacted.  The Fax number that she used was incorrect.  She will fax again to the correct number.

## 2013-09-23 NOTE — Telephone Encounter (Signed)
I noted that I was going to await the hard copy.  I don't see any record of it coming in.  I don't have any recollection of seeing it.  I would verify that the patient wants it and then have them send it.  Thanks.

## 2013-09-23 NOTE — Telephone Encounter (Signed)
Kelly Kennedy with [employer] requesting status of back brace request that was faxed to Dr Para March. Fax # [phone]. [name] request cb.

## 2013-09-26 ENCOUNTER — Ambulatory Visit (AMBULATORY_SURGERY_CENTER): Payer: Medicare Other | Admitting: Gastroenterology

## 2013-09-26 ENCOUNTER — Encounter: Payer: Self-pay | Admitting: Gastroenterology

## 2013-09-26 VITALS — BP 93/73 | HR 84 | Temp 96.7°F | Resp 15 | Ht 63.0 in | Wt 185.0 lb

## 2013-09-26 DIAGNOSIS — D126 Benign neoplasm of colon, unspecified: Secondary | ICD-10-CM

## 2013-09-26 DIAGNOSIS — Z86010 Personal history of colonic polyps: Secondary | ICD-10-CM

## 2013-09-26 MED ORDER — SODIUM CHLORIDE 0.9 % IV SOLN: 500.0000 mL | INTRAVENOUS | Status: DC

## 2013-09-26 NOTE — Telephone Encounter (Signed)
Faxed

## 2013-09-26 NOTE — Telephone Encounter (Signed)
[employer] left v/m requesting status of faxed info for back brace; request faxed back to [phone].

## 2013-09-26 NOTE — Progress Notes (Signed)
Called to room to assist during endoscopic procedure.  Patient ID and intended procedure confirmed with present staff. Received instructions for my participation in the procedure from the performing physician.  

## 2013-09-26 NOTE — Op Note (Signed)
[facility] [address] [place], [address]   COLONOSCOPY PROCEDURE REPORT  PATIENT: Kelly Kennedy, Kelly Kennedy  MR#: [id number] BIRTHDATE: [birth year] , 72  yrs. old GENDER: Female ENDOSCOPIST: Meryl Dare, MD, [facility] PROCEDURE DATE:  09/26/2013 PROCEDURE:   Colonoscopy with snare polypectomy First Screening Colonoscopy - Avg.  risk and is 50 yrs.  old or older - No.  Prior Negative Screening - Now for repeat screening. [name]  History of Adenoma - Now for follow-up colonoscopy & has been > or = to 3 yrs.  Yes hx of adenoma.  Has been 3 or more years since last colonoscopy.  Polyps Removed Today? Yes. ASA CLASS:   Class II INDICATIONS:Patient's personal history of adenomatous colon polyps.  MEDICATIONS: MAC sedation, administered by CRNA, propofol (Diprivan) 150mg  IV DESCRIPTION OF PROCEDURE:   After the risks benefits and alternatives of the procedure were thoroughly explained, informed consent was obtained.  A digital rectal exam revealed no abnormalities of the rectum.   The LB [id number]  endoscope was introduced through the anus and advanced to the cecum, which was identified by both the appendix and ileocecal valve. No adverse events experienced.   The quality of the prep was good, using MoviPrep  The instrument was then slowly withdrawn as the colon was fully examined.  COLON FINDINGS: Two sessile polyps 5-3mm in size were found in the ascending colon. A polypectomy was performed with a cold snare. The resection was complete and the polyp tissue was completely retrieved. The colon was otherwise normal. There was no diverticulosis, inflammation, polyps or cancers unless previously stated. Retroflexed views revealed small internal hemorrhoids. The time to cecum=2 minutes 50 seconds.  Withdrawal time=9 minutes 38 seconds.  The scope was withdrawn and the procedure completed. COMPLICATIONS: There were no complications.  ENDOSCOPIC IMPRESSION: 1.   Two  sessile polyps 5-20mm in the ascending colon; polypectomy performed with a cold snare 2.   Small internal hemorrhoids  RECOMMENDATIONS: 1.  Await pathology results 2.  Repeat Colonoscopy in 5 years.  eSigned:  Meryl Dare, MD, [facility] 09/26/2013 9:02 AM

## 2013-09-26 NOTE — Progress Notes (Signed)
Lidocaine-40mg IV prior to Propofol InductionPropofol given over incremental dosages 

## 2013-09-26 NOTE — Progress Notes (Signed)
Patient did not experience any of the following events: a burn prior to discharge; a fall within the facility; wrong site/side/patient/procedure/implant event; or a hospital transfer or hospital admission upon discharge from the facility. (G8907)Patient did not have preoperative order for IV antibiotic SSI prophylaxis. ave preoperative order for IV antibiotic SSI prophylaxis. [phone])   Pt. Care partner left office premises after she went back for procedure.  She was placed in recliner room to rest until care partner arrived.

## 2013-09-26 NOTE — Patient Instructions (Signed)
YOU HAD AN ENDOSCOPIC PROCEDURE TODAY AT THE [facility]: Refer to the procedure report that was given to you for any specific questions about what was found during the examination.  If the procedure report does not answer your questions, please call your gastroenterologist to clarify.  If you requested that your care partner not be given the details of your procedure findings, then the procedure report has been included in a sealed envelope for you to review at your convenience later.  YOU SHOULD EXPECT: Some feelings of bloating in the abdomen. Passage of more gas than usual.  Walking can help get rid of the air that was put into your GI tract during the procedure and reduce the bloating. If you had a lower endoscopy (such as a colonoscopy or flexible sigmoidoscopy) you may notice spotting of blood in your stool or on the toilet paper. If you underwent a bowel prep for your procedure, then you may not have a normal bowel movement for a few days.  DIET: Your first meal following the procedure should be a light meal and then it is ok to progress to your normal diet.  A half-sandwich or bowl of soup is an example of a good first meal.  Heavy or fried foods are harder to digest and may make you feel nauseous or bloated.  Likewise meals heavy in dairy and vegetables can cause extra gas to form and this can also increase the bloating.  Drink plenty of fluids but you should avoid alcoholic beverages for 24 hours.  ACTIVITY: Your care partner should take you home directly after the procedure.  You should plan to take it easy, moving slowly for the rest of the day.  You can resume normal activity the day after the procedure however you should NOT DRIVE or use heavy machinery for 24 hours (because of the sedation medicines used during the test).    SYMPTOMS TO REPORT IMMEDIATELY: A gastroenterologist can be reached at any hour.  During normal business hours, 8:30 AM to 5:00 PM Monday through Friday,  call [phone].  After hours and on weekends, please call the GI answering service at [phone] who will take a message and have the physician on call contact you.   Following lower endoscopy (colonoscopy or flexible sigmoidoscopy):  Excessive amounts of blood in the stool  Significant tenderness or worsening of abdominal pains  Swelling of the abdomen that is new, acute  Fever of 100F or higher  FOLLOW UP: If any biopsies were taken you will be contacted by phone or by letter within the next 1-3 weeks.  Call your gastroenterologist if you have not heard about the biopsies in 3 weeks.  Our staff will call the home number listed on your records the next business day following your procedure to check on you and address any questions or concerns that you may have at that time regarding the information given to you following your procedure. This is a courtesy call and so if there is no answer at the home number and we have not heard from you through the emergency physician on call, we will assume that you have returned to your regular daily activities without incident.    Polyp, hemorrhohoid, and high fiber diet information given.   Repeat colonoscopy in 5 years.  SIGNATURES/CONFIDENTIALITY: You and/or your care partner have signed paperwork which will be entered into your electronic medical record.  These signatures attest to the fact that that the information above on  your After Visit Summary has been reviewed and is understood.  Full responsibility of the confidentiality of this discharge information lies with you and/or your care-partner. 

## 2013-09-27 ENCOUNTER — Telehealth: Payer: Self-pay | Admitting: *Deleted

## 2013-09-27 NOTE — Telephone Encounter (Signed)
No answer left message to call if questions or concerns. 

## 2013-10-03 ENCOUNTER — Encounter: Payer: Self-pay | Admitting: Gastroenterology

## 2013-10-13 ENCOUNTER — Emergency Department (HOSPITAL_COMMUNITY): Payer: Medicare Other

## 2013-10-13 ENCOUNTER — Emergency Department (HOSPITAL_COMMUNITY)
Admission: EM | Admit: 2013-10-13 | Discharge: 2013-10-14 | Disposition: A | Discharge status: Home / Self Care | Payer: Medicare Other | Attending: Emergency Medicine | Admitting: Emergency Medicine

## 2013-10-13 ENCOUNTER — Encounter (HOSPITAL_COMMUNITY): Payer: Self-pay | Admitting: Emergency Medicine

## 2013-10-13 DIAGNOSIS — I10 Essential (primary) hypertension: Secondary | ICD-10-CM | POA: Insufficient documentation

## 2013-10-13 DIAGNOSIS — Z87891 Personal history of nicotine dependence: Secondary | ICD-10-CM | POA: Insufficient documentation

## 2013-10-13 DIAGNOSIS — Z8742 Personal history of other diseases of the female genital tract: Secondary | ICD-10-CM | POA: Insufficient documentation

## 2013-10-13 DIAGNOSIS — X500XXA Overexertion from strenuous movement or load, initial encounter: Secondary | ICD-10-CM | POA: Insufficient documentation

## 2013-10-13 DIAGNOSIS — Z860100 Personal history of colon polyps, unspecified: Secondary | ICD-10-CM | POA: Insufficient documentation

## 2013-10-13 DIAGNOSIS — S82402A Unspecified fracture of shaft of left fibula, initial encounter for closed fracture: Secondary | ICD-10-CM

## 2013-10-13 DIAGNOSIS — R296 Repeated falls: Secondary | ICD-10-CM | POA: Insufficient documentation

## 2013-10-13 DIAGNOSIS — Y929 Unspecified place or not applicable: Secondary | ICD-10-CM | POA: Insufficient documentation

## 2013-10-13 DIAGNOSIS — E039 Hypothyroidism, unspecified: Secondary | ICD-10-CM | POA: Insufficient documentation

## 2013-10-13 DIAGNOSIS — E785 Hyperlipidemia, unspecified: Secondary | ICD-10-CM | POA: Insufficient documentation

## 2013-10-13 DIAGNOSIS — Z79899 Other long term (current) drug therapy: Secondary | ICD-10-CM | POA: Insufficient documentation

## 2013-10-13 DIAGNOSIS — G4700 Insomnia, unspecified: Secondary | ICD-10-CM | POA: Insufficient documentation

## 2013-10-13 DIAGNOSIS — S82899A Other fracture of unspecified lower leg, initial encounter for closed fracture: Secondary | ICD-10-CM | POA: Insufficient documentation

## 2013-10-13 DIAGNOSIS — Y9301 Activity, walking, marching and hiking: Secondary | ICD-10-CM | POA: Insufficient documentation

## 2013-10-13 MED ORDER — HYDROCODONE-ACETAMINOPHEN 5-325 MG PO TABS: 2.0000 | ORAL_TABLET | ORAL | Status: DC | PRN

## 2013-10-13 NOTE — ED Notes (Signed)
Pt states her knee has a tendency of giving out. She was walking, her L knee gave out, and she states that she twisted her L ankle. Pt c/o pain to lateral L ankle.

## 2013-10-13 NOTE — ED Provider Notes (Signed)
CSN: [id number]     Arrival date & time 10/13/13  1952 History  This chart was scribed for non-physician practitioner, Arthor Captain, PA-C,working with Candyce Churn, MD, by Karle Plumber, ED Scribe.  This patient was seen in room WTR7/WTR7 and the patient's care was started at 8:25 PM.   Chief Complaint  Patient presents with  . Ankle Pain   HPI HPI Comments:  Kelly Kennedy is a 72 y.o. female who presents to the Emergency Department complaining of left ankle injury onset two hours. Pt states her left knee has h/o giving out and did so earlier today. She denies any radiation of the pain, but reports associated knee soreness. She states she rolled her ankle the wrong way and heard a pop. Pt denies numbness of the ankle or foot.    Past Medical History  Diagnosis Date  . Hyperlipidemia   . Diverticulosis of colon 06/2005  . Hypothyroid   . Insomnia   . Menopausal symptoms   . Hypertension   . Hyperglycemia   . adenomatous Colon polyps     colonoscopy 8/99, 12/02, 8/06   Past Surgical History  Procedure Laterality Date  . Partial hysterectomy      ovaries intact  . Cholecystectomy  1990  . Ectopic pregnancy surgery  1971  . Cystectomy  05/04/08    left lower arm, benign   Family History  Problem Relation Age of Onset  . Cancer Father     stomach   . Heart disease Son     MI  . Arthritis Mother   . Diabetes Mother   . Colon cancer Neg Hx   . Pancreatic cancer Neg Hx   . Stomach cancer Neg Hx    History  Substance Use Topics  . Smoking status: Former Games developer  . Smokeless tobacco: Not on file  . Alcohol Use: No   OB History   Grav Para Term Preterm Abortions TAB SAB Ect Mult Living                 Review of Systems  Allergies  Niacin  Home Medications   Current Outpatient Rx  Name  Route  Sig  Dispense  Refill  . amitriptyline (ELAVIL) 50 MG tablet      TAKE 2 TO 3 TABLETS BY MOUTH AT BEDTIME for insomnia.   90 tablet   11   .  Carboxymethylcell-Hypromellose (GENTEAL) 0.25-0.3 % GEL   Ophthalmic   Apply to eye.         . carboxymethylcellulose (REFRESH PLUS) 0.5 % SOLN      1 drop 3 (three) times daily as needed.         Marland Kitchen estradiol (CLIMARA - DOSED IN MG/24 HR) 0.1 mg/24hr patch   Transdermal   Place 1 patch (0.1 mg total) onto the skin once a week.   4 patch   12   . fluocinonide cream (LIDEX) 0.05 %   Topical   Apply topically daily. As needed.   30 g   12   . furosemide (LASIX) 20 MG tablet   Oral   Take 1 tablet (20 mg total) by mouth daily.   30 tablet   12   . levothyroxine (SYNTHROID, LEVOTHROID) 100 MCG tablet      Take one tablet by mouth daily except on Monday and Wednesday take 1/2 tablet   30 tablet   12   . losartan (COZAAR) 100 MG tablet   Oral   Take  1 tablet (100 mg total) by mouth daily. To replace lisinopril rx   90 tablet   3   . meloxicam (MOBIC) 15 MG tablet      TAKE 1 TABLET (15 MG TOTAL) BY MOUTH DAILY.   30 tablet   12   . Multiple Vitamins-Minerals (OCUVITE PRESERVISION) TABS      Take one tablet by mouth daily          . nitroGLYCERIN (NITROSTAT) 0.4 MG SL tablet      TAKE 1 TABLET BY MOUTH SUBLINGULALLY AS NEEDED   25 tablet   12   . rosuvastatin (CRESTOR) 10 MG tablet   Oral   Take 1 tablet (10 mg total) by mouth daily.   30 tablet   12   . tretinoin (RETIN-A) 0.025 % cream   Topical   Apply topically at bedtime. as needed   45 g   12    Triage Vitals: BP 188/89  Pulse 99  Temp(Src) 98.6 F (37 C) (Oral)  Resp 16  Ht 5\' 3"  (1.6 m)  Wt 185 lb (83.915 kg)  BMI 32.78 kg/m2  SpO2 97% Physical Exam  Nursing note and vitals reviewed. Constitutional: She is oriented to person, place, and time. She appears well-developed and well-nourished. No distress.  HENT:  Head: Normocephalic and atraumatic.  Eyes: Conjunctivae are normal. No scleral icterus.  Neck: Neck supple.  Cardiovascular: Normal rate and intact distal pulses.    Pulmonary/Chest: Effort normal. No stridor. No respiratory distress.  Abdominal: Normal appearance. She exhibits no distension.  Musculoskeletal: She exhibits tenderness.  Tenderness predominately to left lateral malleolus. Swelling present. Tenderness over proximal metatarsal. No fifth metatarsal head tenderness. No ecchymosis.   Neurological: She is alert and oriented to person, place, and time.  Skin: Skin is warm and dry. No rash noted.  Psychiatric: She has a normal mood and affect. Her behavior is normal.    ED Course  Procedures (including critical care time) DIAGNOSTIC STUDIES: Oxygen Saturation is 97% on RA, normal by my interpretation.   COORDINATION OF CARE: 8:08 PM- Pt verbalizes understanding and agrees to plan.  Medications - No data to display  Labs Review Labs Reviewed - No data to display Imaging Review Dg Ankle Complete Left  10/13/2013   CLINICAL DATA:  Lateral ankle pain secondary to a fall.  EXAM: LEFT ANKLE COMPLETE - 3+ VIEW  COMPARISON:  None.  FINDINGS: There is a displaced slightly angulated spiral fracture of the distal fibular shaft. There is circumferential soft tissue swelling and an ankle effusion. Old avulsion from the tip of the medial malleolus. Slight widening of the medial joint space at the ankle.  IMPRESSION: Spiral fracture of the distal fibula.   Electronically Signed   By: Geanie Cooley M.D.   On: 10/13/2013 20:44   Dg Knee Complete 4 Views Left  10/13/2013   CLINICAL DATA:  Left knee pain secondary to a fall.  EXAM: LEFT KNEE - COMPLETE 4+ VIEW  COMPARISON:  None.  FINDINGS: There is no fracture or dislocation. No severe degenerative changes. No joint effusion.  IMPRESSION: No acute abnormalities.   Electronically Signed   By: Geanie Cooley M.D.   On: 10/13/2013 20:45    EKG Interpretation   None       MDM   1. Closed fibular fracture, left, initial encounter    Filed Vitals:   10/13/13 2001 10/13/13 2342  BP: 188/89 156/89  Pulse:  99 88  Temp: 98.6 F (  37 C)   TempSrc: Oral   Resp: 16 18  Height: 5\' 3"  (1.6 m)   Weight: 185 lb (83.915 kg)   SpO2: 97% 100%    Patient with L non-displaced  Spiral fracture of the distal fibula. Possibly unstable, however, unable to obtain stress views here in the ED. Patient placed in stirrup and posterior leg splint. She is NWB on crutches and is to follow up ASAP with orthopedics. HTN has resolved somewhat. Advised patient to take her medication as directed and do not skip days on her htn. norco for pain  I personally performed the services described in this documentation, which was scribed in my presence. The recorded information has been reviewed and is accurate.    Arthor Captain, PA-C 10/15/13 1231

## 2013-10-14 MED ORDER — HYDROCODONE-ACETAMINOPHEN 5-325 MG PO TABS
1.0000 | ORAL_TABLET | Freq: Once | ORAL | Status: AC
  Administered 2013-10-14: 1 via ORAL
  Filled 2013-10-14: qty 1

## 2013-10-15 ENCOUNTER — Other Ambulatory Visit (HOSPITAL_COMMUNITY): Payer: Self-pay | Admitting: Orthopaedic Surgery

## 2013-10-17 ENCOUNTER — Encounter (HOSPITAL_COMMUNITY): Payer: Self-pay

## 2013-10-17 ENCOUNTER — Encounter (HOSPITAL_COMMUNITY)
Admission: RE | Admit: 2013-10-17 | Discharge: 2013-10-17 | Disposition: A | Discharge status: Home / Self Care | Payer: Medicare Other | Source: Ambulatory Visit | Attending: Orthopaedic Surgery | Admitting: Orthopaedic Surgery

## 2013-10-17 ENCOUNTER — Ambulatory Visit (HOSPITAL_COMMUNITY)
Admission: RE | Admit: 2013-10-17 | Discharge: 2013-10-17 | Disposition: A | Discharge status: Home / Self Care | Payer: Medicare Other | Source: Ambulatory Visit | Attending: Orthopaedic Surgery | Admitting: Orthopaedic Surgery

## 2013-10-17 ENCOUNTER — Other Ambulatory Visit (HOSPITAL_COMMUNITY): Payer: Self-pay | Admitting: *Deleted

## 2013-10-17 DIAGNOSIS — I10 Essential (primary) hypertension: Secondary | ICD-10-CM | POA: Insufficient documentation

## 2013-10-17 DIAGNOSIS — Z87891 Personal history of nicotine dependence: Secondary | ICD-10-CM | POA: Insufficient documentation

## 2013-10-17 DIAGNOSIS — E785 Hyperlipidemia, unspecified: Secondary | ICD-10-CM | POA: Insufficient documentation

## 2013-10-17 DIAGNOSIS — K449 Diaphragmatic hernia without obstruction or gangrene: Secondary | ICD-10-CM | POA: Insufficient documentation

## 2013-10-17 DIAGNOSIS — E039 Hypothyroidism, unspecified: Secondary | ICD-10-CM | POA: Insufficient documentation

## 2013-10-17 DIAGNOSIS — Z01818 Encounter for other preprocedural examination: Secondary | ICD-10-CM | POA: Insufficient documentation

## 2013-10-17 DIAGNOSIS — X58XXXA Exposure to other specified factors, initial encounter: Secondary | ICD-10-CM | POA: Insufficient documentation

## 2013-10-17 DIAGNOSIS — S82899A Other fracture of unspecified lower leg, initial encounter for closed fracture: Secondary | ICD-10-CM | POA: Insufficient documentation

## 2013-10-17 HISTORY — DX: Bell's palsy: G51.0

## 2013-10-17 HISTORY — DX: Chest pain, unspecified: R07.9

## 2013-10-17 HISTORY — DX: Personal history of other diseases of the digestive system: Z87.19

## 2013-10-17 HISTORY — DX: Gastro-esophageal reflux disease without esophagitis: K21.9

## 2013-10-17 HISTORY — DX: Depression, unspecified: F32.A

## 2013-10-17 HISTORY — DX: Major depressive disorder, single episode, unspecified: F32.9

## 2013-10-17 HISTORY — DX: Unspecified osteoarthritis, unspecified site: M19.90

## 2013-10-17 HISTORY — DX: Disease of pericardium, unspecified: I31.9

## 2013-10-17 HISTORY — DX: Irritable bowel syndrome, unspecified: K58.9

## 2013-10-17 HISTORY — DX: Headache: R51

## 2013-10-17 LAB — BASIC METABOLIC PANEL
BUN: 9 mg/dL (ref 6–23)
CO2: 27 mEq/L (ref 19–32)
Calcium: 8.6 mg/dL (ref 8.4–10.5)
Chloride: 100 mEq/L (ref 96–112)
Creatinine, Ser: 0.87 mg/dL (ref 0.50–1.10)
Glucose, Bld: 114 mg/dL — ABNORMAL HIGH (ref 70–99)
Sodium: 140 mEq/L (ref 135–145)

## 2013-10-17 LAB — CBC
HCT: 33.4 % — ABNORMAL LOW (ref 36.0–46.0)
MCH: 29.9 pg (ref 26.0–34.0)
MCHC: 34.4 g/dL (ref 30.0–36.0)
MCV: 86.8 fL (ref 78.0–100.0)
Platelets: 374 10*3/uL (ref 150–400)
RBC: 3.85 MIL/uL — ABNORMAL LOW (ref 3.87–5.11)
WBC: 6.5 10*3/uL (ref 4.0–10.5)

## 2013-10-17 NOTE — Pre-Procedure Instructions (Signed)
Kelly Kennedy  10/17/2013   Your procedure is scheduled on:  Wednesday, October 23, 2013 at 12:30 PM.   Report to [facility] Entrance "A" at 10:30 AM.   Call this number if you have problems the morning of surgery: [phone]   Remember:   Do not eat food or drink liquids after midnight Tuesday, 10/22/13.   Take these medicines the morning of surgery with A SIP OF WATER: levothyroxine (SYNTHROID, LEVOTHROID), nitroGLYCERIN (NITROSTAT) - if needed, HYDROcodone-acetaminophen (NORCO/VICODIN) or acetaminophen (TYLENOL), may use eye drops.  Stop all Vitamins and Aspirin products as of today, 10/17/13.     Do not wear jewelry, make-up or nail polish.  Do not wear lotions, powders, or perfumes. You may wear deodorant.  Do not shave 48 hours prior to surgery.   Do not bring valuables to the hospital.  [facility] is not responsible                  for any belongings or valuables.               Contacts, dentures or bridgework may not be worn into surgery.  Leave suitcase in the car. After surgery it may be brought to your room.  For patients admitted to the hospital, discharge time is determined by your                treatment team.         Special Instructions: Shower using CHG 2 nights before surgery and the night before surgery.  If you shower the day of surgery use CHG.  Use special wash - you have one bottle of CHG for all showers.  You should use approximately 1/3 of the bottle for each shower.   Please read over the following fact sheets that you were given: Pain Booklet, Coughing and Deep Breathing and Surgical Site Infection Prevention

## 2013-10-17 NOTE — ED Provider Notes (Signed)
Medical screening examination/treatment/procedure(s) were performed by non-physician practitioner and as supervising physician I was immediately available for consultation/collaboration.  EKG Interpretation   None         Candyce Churn, MD 10/17/13 1139

## 2013-10-18 ENCOUNTER — Telehealth: Payer: Self-pay | Admitting: Family Medicine

## 2013-10-18 NOTE — Telephone Encounter (Addendum)
I have not done anything about a preop eval for the patient recently.   I would NOT proceed with surgery with a K of 2.7.  The ordering MD will usually address the lab result, so I didn't act on the low K level.  Please call them back about this.

## 2013-10-18 NOTE — Telephone Encounter (Signed)
Tried to call the number listed.  Recording was received with no option to leave a message, only an MD on call number.  Dr. Para March stated that, in this case,  this will have to wait until Monday.

## 2013-10-18 NOTE — Progress Notes (Addendum)
Anesthesia Chart Review:  Patient is a 72 year old female scheduled for ORIF of left ankle fracture on 10/23/13 by Kelly Kennedy.  History includes former smoker, HTN, HLD, hypothyroidism, hyperglycemia without diagnosis of DM, hiatal hernia, GERD, IBS, Bell's Palsy, pericarditis '80's, insomnia, arthritis, history of atypical chest pain with prior cardiology evaluation by Dr Juel Burrow at [facility] [phone]) in [place] > 3 years ago.  She was without complaints of chest pain at her PAT visit.  PCP is listed as Dr. Crawford Givens.  EKG on 07/02/13 showed NSR. [facility] did not have any history of prior cardiology testing.  Any cardiac records, as available, were requested from Dr. Fredna Dow office but are still pending.  According to 06/15/10 note from Dr. Para March, she had a prior normal stress echo, EF 50% in 4/02, negative treadmill in 7/05.    CXR on 10/17/13 showed no active cardiopulmonary disease.  Preoperative labs noted.  K+ 2.7.  Lasix, but no KCl is listed on her med list.  I called K+ result to Kelly Kennedy in Triage at Dr. Warren Danes office so she could review with him for treatment recommendations.  I also let her know that I had already entered order to have it rechecked on arrival with goal of increase to at least 3.0.    Kelly Kennedy 10/18/2013 4:03 PM  Addendum: 10/18/2013 5:05 PM Records received from Dr. Juel Burrow.  Last visit was on 04/18/12 and PRN cardiology follow-up was recommended.    Echo on 04/18/12 showed normal left and right ventricular dimensions and wall thickness.  EF 50%. No segmental wall motion abnormalities. Diastolic dysfunction. Valves normal for age.  Mild TR.  She had a negative exercise treadmill test on 03/16/12 with no angina or ischemic changes on EKG.  Addendum: 10/23/13 9:30 AM Dr. Warren Danes office notified Dr. Para March of low potassium.  It appears patient was referred back to cardiology.  She saw Dr. Juel Burrow for  surgical clearance yesterday.  Unfortunately, I still don't see that her hypokalemia was treated.  She is scheduled for surgery later today and is due for an ISTAT on arrival.  Hopefully results will be at least 3.0.

## 2013-10-18 NOTE — Telephone Encounter (Signed)
Marchelle Folks from [facility] called to report that pt's K level is 2.7 and she wants to know if it's ok for pt to proceed with ORIF on Tuesday with Dr. Rockney Ghee.  She also wants to know if Dr. Para March received medical clearance from their office.  Please call back at [phone] and leave a number on the after hours voicemail.

## 2013-10-21 NOTE — Telephone Encounter (Signed)
Left detailed message with receptionist to return call.

## 2013-10-21 NOTE — Telephone Encounter (Signed)
Marchelle Folks advised. Patient states that their office ordered the labs and when the Potassium was low, the results were relayed to Dr. Para March.  Marchelle Folks says that the preop clearance was sent to Cardiology.  Marchelle Folks is going to see what Cardiology says about the Potassium.  Marchelle Folks says the patient is scheduled for surgery on Wednesday.  I explicitly advised Marchelle Folks that Dr. Para March does NOT recommend proceeding with surgery with a K of 2.7.  She states she understands and will consult Cardiology.

## 2013-10-21 NOTE — Telephone Encounter (Signed)
Agreed, thanks

## 2013-10-22 MED ORDER — CEFAZOLIN SODIUM-DEXTROSE 2-3 GM-% IV SOLR
2.0000 g | INTRAVENOUS | Status: AC
  Administered 2013-10-23: 2 g via INTRAVENOUS
  Filled 2013-10-22: qty 50

## 2013-10-23 ENCOUNTER — Encounter (HOSPITAL_COMMUNITY): Payer: Medicare Other | Admitting: Vascular Surgery

## 2013-10-23 ENCOUNTER — Encounter (HOSPITAL_COMMUNITY): Payer: Self-pay | Admitting: *Deleted

## 2013-10-23 ENCOUNTER — Inpatient Hospital Stay (HOSPITAL_COMMUNITY): Payer: Medicare Other | Admitting: Certified Registered"

## 2013-10-23 ENCOUNTER — Inpatient Hospital Stay (HOSPITAL_COMMUNITY): Payer: Medicare Other

## 2013-10-23 ENCOUNTER — Observation Stay (HOSPITAL_COMMUNITY)
Admission: RE | Admit: 2013-10-23 | Discharge: 2013-10-24 | Disposition: A | Discharge status: Home / Self Care | Payer: Medicare Other | Source: Ambulatory Visit | Attending: Orthopaedic Surgery | Admitting: Orthopaedic Surgery

## 2013-10-23 ENCOUNTER — Encounter (HOSPITAL_COMMUNITY)
Admission: RE | Disposition: A | Discharge status: Home / Self Care | Payer: Self-pay | Source: Ambulatory Visit | Attending: Orthopaedic Surgery

## 2013-10-23 DIAGNOSIS — I10 Essential (primary) hypertension: Secondary | ICD-10-CM | POA: Insufficient documentation

## 2013-10-23 DIAGNOSIS — K219 Gastro-esophageal reflux disease without esophagitis: Secondary | ICD-10-CM | POA: Insufficient documentation

## 2013-10-23 DIAGNOSIS — S82899A Other fracture of unspecified lower leg, initial encounter for closed fracture: Secondary | ICD-10-CM

## 2013-10-23 DIAGNOSIS — S82892A Other fracture of left lower leg, initial encounter for closed fracture: Secondary | ICD-10-CM

## 2013-10-23 DIAGNOSIS — E785 Hyperlipidemia, unspecified: Secondary | ICD-10-CM | POA: Insufficient documentation

## 2013-10-23 DIAGNOSIS — S8263XA Displaced fracture of lateral malleolus of unspecified fibula, initial encounter for closed fracture: Principal | ICD-10-CM | POA: Insufficient documentation

## 2013-10-23 DIAGNOSIS — K449 Diaphragmatic hernia without obstruction or gangrene: Secondary | ICD-10-CM | POA: Insufficient documentation

## 2013-10-23 DIAGNOSIS — E039 Hypothyroidism, unspecified: Secondary | ICD-10-CM | POA: Insufficient documentation

## 2013-10-23 DIAGNOSIS — Z87891 Personal history of nicotine dependence: Secondary | ICD-10-CM | POA: Insufficient documentation

## 2013-10-23 DIAGNOSIS — X58XXXA Exposure to other specified factors, initial encounter: Secondary | ICD-10-CM | POA: Insufficient documentation

## 2013-10-23 HISTORY — PX: ORIF ANKLE FRACTURE: SHX5408

## 2013-10-23 LAB — POCT I-STAT 4, (NA,K, GLUC, HGB,HCT)
Glucose, Bld: 115 mg/dL — ABNORMAL HIGH (ref 70–99)
Hemoglobin: 12.6 g/dL (ref 12.0–15.0)
Potassium: 4 mEq/L (ref 3.5–5.1)

## 2013-10-23 SURGERY — OPEN REDUCTION INTERNAL FIXATION (ORIF) ANKLE FRACTURE
Anesthesia: General | Site: Ankle | Laterality: Left | Wound class: Clean

## 2013-10-23 MED ORDER — NEOSTIGMINE METHYLSULFATE 1 MG/ML IJ SOLN
INTRAMUSCULAR | Status: DC | PRN
  Administered 2013-10-23: 4 mg via INTRAVENOUS

## 2013-10-23 MED ORDER — ROCURONIUM BROMIDE 100 MG/10ML IV SOLN
INTRAVENOUS | Status: DC | PRN
  Administered 2013-10-23: 40 mg via INTRAVENOUS
  Administered 2013-10-23: 10 mg via INTRAVENOUS

## 2013-10-23 MED ORDER — NITROGLYCERIN 0.4 MG SL SUBL: 0.4000 mg | SUBLINGUAL_TABLET | SUBLINGUAL | Status: DC | PRN

## 2013-10-23 MED ORDER — METHOCARBAMOL 100 MG/ML IJ SOLN
500.0000 mg | Freq: Four times a day (QID) | INTRAVENOUS | Status: DC | PRN
  Filled 2013-10-23: qty 5

## 2013-10-23 MED ORDER — METHOCARBAMOL 500 MG PO TABS
500.0000 mg | ORAL_TABLET | Freq: Four times a day (QID) | ORAL | Status: DC | PRN
  Administered 2013-10-24: 500 mg via ORAL
  Filled 2013-10-23 (×2): qty 1

## 2013-10-23 MED ORDER — OXYCODONE HCL 5 MG PO TABS: 5.0000 mg | ORAL_TABLET | ORAL | Status: DC | PRN

## 2013-10-23 MED ORDER — FENTANYL CITRATE 0.05 MG/ML IJ SOLN
INTRAMUSCULAR | Status: AC
  Administered 2013-10-23: 50 ug
  Filled 2013-10-23: qty 2

## 2013-10-23 MED ORDER — ARTIFICIAL TEARS OP OINT
TOPICAL_OINTMENT | OPHTHALMIC | Status: DC | PRN
  Administered 2013-10-23: 1 via OPHTHALMIC

## 2013-10-23 MED ORDER — LOSARTAN POTASSIUM 50 MG PO TABS
100.0000 mg | ORAL_TABLET | Freq: Every day | ORAL | Status: DC
  Administered 2013-10-23 – 2013-10-24 (×2): 100 mg via ORAL
  Filled 2013-10-23 (×2): qty 2

## 2013-10-23 MED ORDER — DIPHENHYDRAMINE HCL 12.5 MG/5ML PO ELIX: 25.0000 mg | ORAL_SOLUTION | ORAL | Status: DC | PRN

## 2013-10-23 MED ORDER — FUROSEMIDE 20 MG PO TABS
20.0000 mg | ORAL_TABLET | Freq: Every day | ORAL | Status: DC
  Administered 2013-10-23 – 2013-10-24 (×2): 20 mg via ORAL
  Filled 2013-10-23 (×2): qty 1

## 2013-10-23 MED ORDER — FENTANYL CITRATE 0.05 MG/ML IJ SOLN
25.0000 ug | INTRAMUSCULAR | Status: AC | PRN
  Administered 2013-10-23 (×2): 25 ug via INTRAVENOUS

## 2013-10-23 MED ORDER — CEFAZOLIN SODIUM-DEXTROSE 2-3 GM-% IV SOLR
2.0000 g | Freq: Four times a day (QID) | INTRAVENOUS | Status: AC
  Administered 2013-10-23 – 2013-10-24 (×3): 2 g via INTRAVENOUS
  Filled 2013-10-23 (×3): qty 50

## 2013-10-23 MED ORDER — SENNA 8.6 MG PO TABS
1.0000 | ORAL_TABLET | Freq: Two times a day (BID) | ORAL | Status: DC
  Administered 2013-10-23 – 2013-10-24 (×2): 8.6 mg via ORAL
  Filled 2013-10-23 (×3): qty 1

## 2013-10-23 MED ORDER — MIDAZOLAM HCL 5 MG/5ML IJ SOLN
INTRAMUSCULAR | Status: DC | PRN
  Administered 2013-10-23: 1 mg via INTRAVENOUS

## 2013-10-23 MED ORDER — SORBITOL 70 % SOLN: 30.0000 mL | Freq: Every day | Status: DC | PRN

## 2013-10-23 MED ORDER — METOCLOPRAMIDE HCL 10 MG PO TABS: 5.0000 mg | ORAL_TABLET | Freq: Three times a day (TID) | ORAL | Status: DC | PRN

## 2013-10-23 MED ORDER — FENTANYL CITRATE 0.05 MG/ML IJ SOLN
INTRAMUSCULAR | Status: DC | PRN
  Administered 2013-10-23: 50 ug via INTRAVENOUS
  Administered 2013-10-23: 100 ug via INTRAVENOUS

## 2013-10-23 MED ORDER — ASPIRIN EC 325 MG PO TBEC: 325.0000 mg | DELAYED_RELEASE_TABLET | Freq: Two times a day (BID) | ORAL | Status: DC

## 2013-10-23 MED ORDER — GLYCOPYRROLATE 0.2 MG/ML IJ SOLN
INTRAMUSCULAR | Status: DC | PRN
  Administered 2013-10-23: .6 mg via INTRAVENOUS

## 2013-10-23 MED ORDER — MAGNESIUM CITRATE PO SOLN: 1.0000 | Freq: Once | ORAL | Status: AC | PRN

## 2013-10-23 MED ORDER — LEVOTHYROXINE SODIUM 100 MCG PO TABS
100.0000 ug | ORAL_TABLET | Freq: Every day | ORAL | Status: DC
  Administered 2013-10-24: 100 ug via ORAL
  Filled 2013-10-23 (×2): qty 1

## 2013-10-23 MED ORDER — HYDROCODONE-ACETAMINOPHEN 5-325 MG PO TABS
1.0000 | ORAL_TABLET | ORAL | Status: DC | PRN
  Administered 2013-10-24 (×3): 2 via ORAL
  Filled 2013-10-23 (×3): qty 2

## 2013-10-23 MED ORDER — PROMETHAZINE HCL 25 MG PO TABS: 25.0000 mg | ORAL_TABLET | Freq: Four times a day (QID) | ORAL | Status: DC | PRN

## 2013-10-23 MED ORDER — LACTATED RINGERS IV SOLN
INTRAVENOUS | Status: DC | PRN
  Administered 2013-10-23 (×2): via INTRAVENOUS

## 2013-10-23 MED ORDER — MORPHINE SULFATE 2 MG/ML IJ SOLN
1.0000 mg | INTRAMUSCULAR | Status: DC | PRN
  Administered 2013-10-23: 1 mg via INTRAVENOUS
  Filled 2013-10-23: qty 1

## 2013-10-23 MED ORDER — LIDOCAINE HCL (CARDIAC) 20 MG/ML IV SOLN
INTRAVENOUS | Status: DC | PRN
  Administered 2013-10-23: 60 mg via INTRAVENOUS

## 2013-10-23 MED ORDER — AMITRIPTYLINE HCL 50 MG PO TABS
50.0000 mg | ORAL_TABLET | Freq: Every evening | ORAL | Status: DC | PRN
  Filled 2013-10-23: qty 2

## 2013-10-23 MED ORDER — ONDANSETRON HCL 4 MG/2ML IJ SOLN: 4.0000 mg | Freq: Four times a day (QID) | INTRAMUSCULAR | Status: DC | PRN

## 2013-10-23 MED ORDER — POLYETHYLENE GLYCOL 3350 17 G PO PACK: 17.0000 g | PACK | Freq: Every day | ORAL | Status: DC | PRN

## 2013-10-23 MED ORDER — 0.9 % SODIUM CHLORIDE (POUR BTL) OPTIME
TOPICAL | Status: DC | PRN
  Administered 2013-10-23: 1000 mL

## 2013-10-23 MED ORDER — METOCLOPRAMIDE HCL 5 MG/ML IJ SOLN: 5.0000 mg | Freq: Three times a day (TID) | INTRAMUSCULAR | Status: DC | PRN

## 2013-10-23 MED ORDER — KETOROLAC TROMETHAMINE 30 MG/ML IJ SOLN
INTRAMUSCULAR | Status: AC
  Administered 2013-10-23: 17:00:00
  Filled 2013-10-23: qty 1

## 2013-10-23 MED ORDER — LACTATED RINGERS IV SOLN
INTRAVENOUS | Status: DC
  Administered 2013-10-23: 11:00:00 via INTRAVENOUS

## 2013-10-23 MED ORDER — ASPIRIN EC 325 MG PO TBEC
325.0000 mg | DELAYED_RELEASE_TABLET | Freq: Two times a day (BID) | ORAL | Status: DC
  Administered 2013-10-23 – 2013-10-24 (×2): 325 mg via ORAL
  Filled 2013-10-23 (×4): qty 1

## 2013-10-23 MED ORDER — KETOROLAC TROMETHAMINE 30 MG/ML IJ SOLN
30.0000 mg | Freq: Four times a day (QID) | INTRAMUSCULAR | Status: DC | PRN
  Administered 2013-10-23: 30 mg via INTRAVENOUS
  Filled 2013-10-23: qty 1

## 2013-10-23 MED ORDER — OXYCODONE HCL 5 MG PO TABS
5.0000 mg | ORAL_TABLET | ORAL | Status: DC | PRN
  Administered 2013-10-24: 10 mg via ORAL
  Filled 2013-10-23: qty 2

## 2013-10-23 MED ORDER — SODIUM CHLORIDE 0.9 % IV SOLN
INTRAVENOUS | Status: DC
  Administered 2013-10-24: 01:00:00 via INTRAVENOUS

## 2013-10-23 MED ORDER — ONDANSETRON HCL 4 MG PO TABS
4.0000 mg | ORAL_TABLET | Freq: Four times a day (QID) | ORAL | Status: DC | PRN
Start: 2013-10-23 — End: 2013-10-24

## 2013-10-23 MED ORDER — PROPOFOL 10 MG/ML IV BOLUS
INTRAVENOUS | Status: DC | PRN
  Administered 2013-10-23: 150 mg via INTRAVENOUS

## 2013-10-23 MED ORDER — ONDANSETRON HCL 4 MG/2ML IJ SOLN
INTRAMUSCULAR | Status: DC | PRN
  Administered 2013-10-23: 4 mg via INTRAVENOUS

## 2013-10-23 MED ORDER — PHENYLEPHRINE HCL 10 MG/ML IJ SOLN
INTRAMUSCULAR | Status: DC | PRN
  Administered 2013-10-23: 40 ug via INTRAVENOUS
  Administered 2013-10-23: 120 ug via INTRAVENOUS
  Administered 2013-10-23: 40 ug via INTRAVENOUS
  Administered 2013-10-23: 120 ug via INTRAVENOUS

## 2013-10-23 SURGICAL SUPPLY — 56 items
BANDAGE ELASTIC 4 VELCRO ST LF (GAUZE/BANDAGES/DRESSINGS) ×1 IMPLANT
BANDAGE ELASTIC 6 VELCRO ST LF (GAUZE/BANDAGES/DRESSINGS) ×1 IMPLANT
BNDG COHESIVE 4X5 TAN STRL (GAUZE/BANDAGES/DRESSINGS) ×2 IMPLANT
BNDG COHESIVE 6X5 TAN STRL LF (GAUZE/BANDAGES/DRESSINGS) ×3 IMPLANT
CLOTH BEACON ORANGE TIMEOUT ST (SAFETY) ×2 IMPLANT
COVER SURGICAL LIGHT HANDLE (MISCELLANEOUS) ×2 IMPLANT
CUFF TOURNIQUET SINGLE 34IN LL (TOURNIQUET CUFF) IMPLANT
CUFF TOURNIQUET SINGLE 44IN (TOURNIQUET CUFF) IMPLANT
DRAPE C-ARM 42X72 X-RAY (DRAPES) ×2 IMPLANT
DRAPE C-ARMOR (DRAPES) ×2 IMPLANT
DRAPE INCISE IOBAN 66X45 STRL (DRAPES) ×1 IMPLANT
DRAPE U-SHAPE 47X51 STRL (DRAPES) ×4 IMPLANT
DRSG PAD ABDOMINAL 8X10 ST (GAUZE/BANDAGES/DRESSINGS) ×2 IMPLANT
DURAPREP 26ML APPLICATOR (WOUND CARE) ×2 IMPLANT
ELECT REM PT RETURN 9FT ADLT (ELECTROSURGICAL) ×2
ELECTRODE REM PT RTRN 9FT ADLT (ELECTROSURGICAL) ×1 IMPLANT
GAUZE XEROFORM 5X9 LF (GAUZE/BANDAGES/DRESSINGS) ×2 IMPLANT
GLOVE SURG SS PI 7.5 STRL IVOR (GLOVE) ×4 IMPLANT
GOWN STRL NON-REIN LRG LVL3 (GOWN DISPOSABLE) ×4 IMPLANT
GOWN STRL REIN XL XLG (GOWN DISPOSABLE) ×2 IMPLANT
KIT BASIN OR (CUSTOM PROCEDURE TRAY) ×2 IMPLANT
KIT ROOM TURNOVER OR (KITS) ×2 IMPLANT
NDL HYPO 25GX1X1/2 BEV (NEEDLE) ×1 IMPLANT
NEEDLE HYPO 25GX1X1/2 BEV (NEEDLE) ×2 IMPLANT
NS IRRIG 1000ML POUR BTL (IV SOLUTION) ×2 IMPLANT
PACK ORTHO EXTREMITY (CUSTOM PROCEDURE TRAY) ×2 IMPLANT
PAD ARMBOARD 7.5X6 YLW CONV (MISCELLANEOUS) ×4 IMPLANT
PAD CAST 3X4 CTTN HI CHSV (CAST SUPPLIES) ×2 IMPLANT
PADDING CAST COTTON 3X4 STRL (CAST SUPPLIES)
PADDING CAST COTTON 6X4 STRL (CAST SUPPLIES) ×1 IMPLANT
PADDING CAST SYN 6 (CAST SUPPLIES) ×1
PADDING CAST SYNTHETIC 4 (CAST SUPPLIES) ×1
PADDING CAST SYNTHETIC 4X4 STR (CAST SUPPLIES) IMPLANT
PADDING CAST SYNTHETIC 6X4 NS (CAST SUPPLIES) IMPLANT
PLATE BONE 7-HOLE LOCK 86MM (Plate) ×1 IMPLANT
SCREW CANC 5.0X14 (Screw) IMPLANT
SCREW CANCELLOUS 5.0X16MM (Screw) ×1 IMPLANT
SCREW LOCK 3.5X14 (Screw) ×2 IMPLANT
SCREW NL 3.5X14 (Screw) IMPLANT
SCREW NON LOCK 3.5X12 (Screw) ×1 IMPLANT
SCREW NONLOCK 3.5X10 (Screw) ×2 IMPLANT
SPLINT FIBERGLASS 4X30 (CAST SUPPLIES) ×2 IMPLANT
SPONGE GAUZE 4X4 12PLY (GAUZE/BANDAGES/DRESSINGS) ×2 IMPLANT
SPONGE LAP 18X18 X RAY DECT (DISPOSABLE) ×3 IMPLANT
SUCTION FRAZIER TIP 10 FR DISP (SUCTIONS) ×2 IMPLANT
SUT ETHILON 2 0 FS 18 (SUTURE) ×1 IMPLANT
SUT ETHILON 3 0 PS 1 (SUTURE) ×2 IMPLANT
SUT VIC AB 0 CT1 27 (SUTURE) ×2
SUT VIC AB 0 CT1 27XBRD ANBCTR (SUTURE) IMPLANT
SUT VIC AB 2-0 CT1 27 (SUTURE) ×2
SUT VIC AB 2-0 CT1 TAPERPNT 27 (SUTURE) IMPLANT
SYR CONTROL 10ML LL (SYRINGE) ×2 IMPLANT
TOWEL OR 17X24 6PK STRL BLUE (TOWEL DISPOSABLE) ×2 IMPLANT
TOWEL OR 17X26 10 PK STRL BLUE (TOWEL DISPOSABLE) ×4 IMPLANT
TUBE CONNECTING 12X1/4 (SUCTIONS) ×2 IMPLANT
WATER STERILE IRR 1000ML POUR (IV SOLUTION) ×2 IMPLANT

## 2013-10-23 NOTE — Transfer of Care (Signed)
Immediate Anesthesia Transfer of Care Note  Patient: Kelly Kennedy  Procedure(s) Performed: Procedure(s): OPEN REDUCTION INTERNAL FIXATION (ORIF) LEFT ANKLE FRACTURE (Left)  Patient Location: PACU  Anesthesia Type:General  Level of Consciousness: awake, alert , oriented and patient cooperative  Airway & Oxygen Therapy: Patient Spontanous Breathing and Patient connected to nasal cannula oxygen  Post-op Assessment: Report given to PACU RN and Post -op Vital signs reviewed and stable  Post vital signs: Reviewed and stable  Complications: No apparent anesthesia complications

## 2013-10-23 NOTE — Anesthesia Procedure Notes (Signed)
Procedure Name: Intubation Date/Time: 10/23/2013 2:29 PM Performed by: Angelica Pou Pre-anesthesia Checklist: Patient identified, Patient being monitored, Emergency Drugs available, Timeout performed and Suction available Patient Re-evaluated:Patient Re-evaluated prior to inductionOxygen Delivery Method: Circle system utilized Preoxygenation: Pre-oxygenation with 100% oxygen Intubation Type: IV induction Ventilation: Mask ventilation without difficulty and Oral airway inserted - appropriate to patient size Laryngoscope Size: Mac and 3 Grade View: Grade I Tube type: Oral Tube size: 7.0 mm Number of attempts: 1 Airway Equipment and Method: Stylet and Oral airway Placement Confirmation: ETT inserted through vocal cords under direct vision,  breath sounds checked- equal and bilateral and positive ETCO2 Secured at: 22 cm Tube secured with: Tape Dental Injury: Teeth and Oropharynx as per pre-operative assessment  Comments: Smooth IV induction by Dr Randa Evens; easy atraumatic intubation by Pasty Arch CRNA.

## 2013-10-23 NOTE — Preoperative (Signed)
Beta Blockers   Reason not to administer Beta Blockers:Not Applicable 

## 2013-10-23 NOTE — Op Note (Signed)
Date of surgery: 10/23/2013  Preoperative diagnosis: Left lateral malleolus fracture  Postoperative diagnosis: Same  Procedure: Open treatment of lateral malleolus fracture with internal fixation.  Surgeon: Glee Arvin, M.D.  Anesthesia: Gen.  Estimated blood loss: Minimal  Complications: None  Condition to the PACU: Stable  Indications for procedure: [name] is a 72 year old female who sustained the above mentioned condition as a result of a mechanical fall. She was evaluated in my office in the recommendation was treated with surgery. Given the fact that she had cardiomegaly she was told to see her cardiologist for preoperative clearance he did provide [place] with. The risks, benefits, and alternatives to surgery were discussed with the patient and the patient elected to proceed with surgery.  Description of procedure: The patient was identified in the preoperative holding area. The operative site and procedure were confirmed with the patient and marked by the surgeon. She is brought back to the operating room. She was placed supine on the table. General anesthesia was induced. The left lower extremity was prepped and draped in standard sterile fashion. A nonsterile tourniquet was placed on the upper thigh prior to prepping and draping. A timeout was performed. Pre-incisional antibiotics were given. We used a laterally-based incision over the fibula. Full-thickness flaps were raised off of the fascia. The fascia was then sharply incised and elevated off of the fibula. The fracture site was visualized and fracture hematoma and comminuted bone were debrided from the surfaces. The leading edge of the periosteum on both ends of the fracture were elevated back. Fracture reduction was achieved with the use of a tenaculum clamp. Given the comminution of the fracture the decision was made to fix it in [employer] fashion. A 7-hole semitubular plate was sized. 3 screws above and below the fracture site were  placed under fluoroscopy. Orthogonal views were taken to confirm adequate fracture reduction and plate and screw placement. The wound is thoroughly irrigated with normal saline. The tourniquet was inflated and hemostasis was achieved. The wound closed in layer fashion with 0 Vicryl for the fascia 2-0 Vicryl for the subcuticular layer and 3-0 nylon for the skin. A sterile dressing was applied and a short-leg splint in neutral ankle flexion was placed at the end of the procedure. The patient awoke from anesthesia uneventfully and transferred to the PACU.  Disposition: The patient will be admitted to the orthopedic unit. She'll be strict nonweightbearing to left lower extremity. She needs to elevate this as much as possible above the level of her heart. To undergo physical therapy evaluation in the morning for mobilization safety. She will return to see me back in the office in 2 weeks.  Mayra Reel, MD [facility] [phone] 4:58 PM

## 2013-10-23 NOTE — Brief Op Note (Signed)
   Brief Op Note  Date of Surgery: 10/23/2013  Preoperative Diagnosis: Left lateral malleolus fracture  Postoperative Diagnosis: same  Procedure: Procedure(s): OPEN REDUCTION INTERNAL FIXATION (ORIF) LEFT ANKLE FRACTURE  Implants: S&N semitubular plate  Surgeons: Surgeon(s): Naiping Glee Arvin, MD  Anesthesia: General  Drains: none  Estimated Blood Loss: minimal  Complications: None  Condition to PACU: Stable  Naiping Glee Arvin, MD [facility] 10/23/2013 3:56 PM

## 2013-10-23 NOTE — Anesthesia Postprocedure Evaluation (Signed)
  Anesthesia Post-op Note  Patient: Kelly Kennedy  Procedure(s) Performed: Procedure(s): OPEN REDUCTION INTERNAL FIXATION (ORIF) LEFT ANKLE FRACTURE (Left)  Patient Location: PACU  Anesthesia Type:General  Level of Consciousness: awake  Airway and Oxygen Therapy: Patient Spontanous Breathing  Post-op Pain: mild  Post-op Assessment: Post-op Vital signs reviewed  Post-op Vital Signs: Reviewed  Complications: No apparent anesthesia complications

## 2013-10-23 NOTE — H&P (Signed)
PREOPERATIVE H&P  Chief Complaint: Left ankle fracture  HPI: Kelly Kennedy is a 72 y.o. female who presents for surgical treatment of left ankle fracture.  R/b/a were discussed and she elects to proceed.  Cardiologist has cleared her for surgery.  K is 4.0.  Past Medical History  Diagnosis Date  . Hyperlipidemia   . Diverticulosis of colon 06/2005  . Hypothyroid   . Insomnia   . Menopausal symptoms   . Hypertension   . Hyperglycemia   . adenomatous Colon polyps     colonoscopy 8/99, 12/02, 8/06  . Pericarditis 1980's  . Chest pain     uses NTG as needed  . Depression   . Bell's palsy   . GERD (gastroesophageal reflux disease)   . H/O hiatal hernia   . Headache(784.0)   . Arthritis   . Irritable bowel    Past Surgical History  Procedure Laterality Date  . Partial hysterectomy      ovaries intact  . Cholecystectomy  1990  . Ectopic pregnancy surgery  1971  . Cystectomy  05/04/08    left lower arm, benign  . Tonsillectomy    . Appendectomy    . Cardiac catheterization    . Fracture surgery      nose  . Tubal ligation     History   Social History  . Marital Status: Widowed    Spouse Name: [name]    Number of Children: 3  . Years of Education: [name]   Occupational History  . retired, previous Scientist, product/process development    Social History Main Topics  . Smoking status: Former Games developer  . Smokeless tobacco: Former Neurosurgeon  . Alcohol Use: No  . Drug Use: No  . Sexual Activity: None   Other Topics Concern  . None   Social History Narrative   From [place].   3 children, one died from MI in 05-06-05   Married 05-07-1959   Husband and brother died in the same week in 12/06/12   Family History  Problem Relation Age of Onset  . Cancer Father     stomach   . Heart disease Son     MI  . Arthritis Mother   . Diabetes Mother   . Colon cancer Neg Hx   . Pancreatic cancer Neg Hx   . Stomach cancer Neg Hx    Allergies  Allergen Reactions  . Niacin     REACTION: rash  . Red Dye  Rash   Prior to Admission medications   Medication Sig Start Date End Date Taking? Authorizing Provider  acetaminophen (TYLENOL) 500 MG tablet Take 500 mg by mouth every 6 (six) hours as needed for mild pain.    Yes Historical Provider, MD  amitriptyline (ELAVIL) 50 MG tablet Take 50-100 mg by mouth at bedtime as needed for sleep.    Yes Historical Provider, MD  aspirin EC 81 MG tablet Take 81 mg by mouth daily.   Yes Historical Provider, MD  Aspirin-Salicylamide-Caffeine (BC HEADACHE PO) Take 1 packet by mouth daily as needed (headache).    Yes Historical Provider, MD  Carboxymethylcellul-Glycerin (OPTIVE) 0.5-0.9 % SOLN Apply 1 drop to eye 2 (two) times daily as needed (for dry eyes).   Yes Historical Provider, MD  estradiol (CLIMARA - DOSED IN MG/24 HR) 0.1 mg/24hr patch Place 1 patch (0.1 mg total) onto the skin once a week. 07/02/13  Yes Joaquim Nam, MD  furosemide (LASIX) 20 MG tablet Take 1 tablet (20 mg  total) by mouth daily. 07/02/13  Yes Joaquim Nam, MD  HYDROcodone-acetaminophen (NORCO/VICODIN) 5-325 MG per tablet Take 2 tablets by mouth every 4 (four) hours as needed for moderate pain.   Yes Historical Provider, MD  Hypromellose (GENTEAL OP) Apply 1 application to eye at bedtime.   Yes Historical Provider, MD  ketotifen (ZADITOR) 0.025 % ophthalmic solution Place 1 drop into both eyes daily.   Yes Historical Provider, MD  levothyroxine (SYNTHROID, LEVOTHROID) 100 MCG tablet Take one tablet by mouth daily except on Monday and Wednesday take 1/2 tablet 07/02/13  Yes Joaquim Nam, MD  losartan (COZAAR) 100 MG tablet Take 1 tablet (100 mg total) by mouth daily. To replace lisinopril rx 07/08/13  Yes Joaquim Nam, MD  nitroGLYCERIN (NITROSTAT) 0.4 MG SL tablet Place 0.4 mg under the tongue every 5 (five) minutes as needed for chest pain.    Yes Historical Provider, MD  rosuvastatin (CRESTOR) 10 MG tablet Take 1 tablet (10 mg total) by mouth daily. 07/02/13  Yes Joaquim Nam, MD   triamcinolone ointment (KENALOG) 0.1 % Apply 1 application topically 2 (two) times daily.   Yes Historical Provider, MD  OVER THE COUNTER MEDICATION Take 1 tablet by mouth daily. Eye Vitamin    Historical Provider, MD     Positive ROS: All other systems have been reviewed and were otherwise negative with the exception of those mentioned in the HPI and as above.  Physical Exam: General: Alert, no acute distress Cardiovascular: No pedal edema Respiratory: No cyanosis, no use of accessory musculature GI: No organomegaly, abdomen is soft and non-tender Skin: No lesions in the area of chief complaint Neurologic: Sensation intact distally Psychiatric: Patient is competent for consent with normal mood and affect Lymphatic: No axillary or cervical lymphadenopathy  MUSCULOSKELETAL:  LLE in splint.  Toes wwp  Assessment: Left ankle fracture  Plan: Plan for Procedure(s): OPEN REDUCTION INTERNAL FIXATION (ORIF) LEFT ANKLE FRACTURE  The risks benefits and alternatives were discussed with the patient including but not limited to the risks of nonoperative treatment, versus surgical intervention including infection, bleeding, nerve injury,  blood clots, cardiopulmonary complications, morbidity, mortality, among others, and they were willing to proceed.   Cheral Almas, MD   10/23/2013 10:48 AM

## 2013-10-23 NOTE — Anesthesia Preprocedure Evaluation (Addendum)
Anesthesia Evaluation  Patient identified by MRN, date of birth, ID band Patient awake    Reviewed: Allergy & Precautions, H&P , NPO status , Patient's Chart, lab work & pertinent test results  History of Anesthesia Complications Negative for: history of anesthetic complications  Airway Mallampati: II TM Distance: >3 FB Neck ROM: Full    Dental  (+) Edentulous Upper and Dental Advisory Given   Pulmonary former smoker,  breath sounds clear to auscultation  Pulmonary exam normal       Cardiovascular hypertension, Pt. on medications Rhythm:Regular Rate:Normal     Neuro/Psych  Headaches,    GI/Hepatic Neg liver ROS, hiatal hernia, GERD-  Medicated and Controlled,  Endo/Other  Hypothyroidism   Renal/GU negative Renal ROS     Musculoskeletal   Abdominal   Peds  Hematology negative hematology ROS (+)   Anesthesia Other Findings   Reproductive/Obstetrics                         Anesthesia Physical Anesthesia Plan  ASA: III  Anesthesia Plan: General   Post-op Pain Management:    Induction: Intravenous  Airway Management Planned: Oral ETT  Additional Equipment:   Intra-op Plan:   Post-operative Plan: Extubation in OR  Informed Consent: I have reviewed the patients History and Physical, chart, labs and discussed the procedure including the risks, benefits and alternatives for the proposed anesthesia with the patient or authorized representative who has indicated his/her understanding and acceptance.   Dental advisory given  Plan Discussed with: CRNA, Anesthesiologist and Surgeon  Anesthesia Plan Comments:         Anesthesia Quick Evaluation

## 2013-10-24 ENCOUNTER — Encounter (HOSPITAL_COMMUNITY): Payer: Self-pay

## 2013-10-24 LAB — BASIC METABOLIC PANEL
BUN: 8 mg/dL (ref 6–23)
CO2: 26 mEq/L (ref 19–32)
Chloride: 103 mEq/L (ref 96–112)
Creatinine, Ser: 0.76 mg/dL (ref 0.50–1.10)
GFR calc Af Amer: >90 mL/min (ref >90)
Glucose, Bld: 96 mg/dL (ref 70–99)
Potassium: 3.9 mEq/L (ref 3.5–5.1)

## 2013-10-24 NOTE — Evaluation (Signed)
Physical Therapy Evaluation Patient Details Name: Kelly Kennedy MRN: [id number] DOB: 04-15-1941 Today's Date: 10/24/2013 Time: 0900-0928 PT Time Calculation (min): 28 min  PT Assessment / Plan / Recommendation History of Present Illness  s/p ORIF Lt ankle   Clinical Impression  Patient is s/p Lt ankle ORIF surgery resulting in functional limitations due to the deficits listed below (see PT Problem List).  Patient will benefit from skilled PT to increase their independence and safety with mobility to allow discharge to the venue listed below. Pt was educated on proper stair ambulation technique and demo good technique with knee walker. Pt will benefit from wheelchair with elevated leg rest for community distances. Pt planned for D/C today. Will have supervision 24/7. Is safe from mobility standpoint to D/C today; will cont to follow acutely if continues to stay.      PT Assessment  Patient needs continued PT services    Follow Up Recommendations  Supervision/Assistance - 24 hour;No PT follow up;Outpatient PT;Other (comment) (when WB status changes )    Does the patient have the potential to tolerate intense rehabilitation      Barriers to Discharge        Equipment Recommendations  Wheelchair (measurements PT);Other (comment) (elevated leg rest)    Recommendations for Other Services     Frequency Min 4X/week    Precautions / Restrictions Precautions Precautions: Fall Restrictions Weight Bearing Restrictions: Yes LLE Weight Bearing: Non weight bearing LLE Partial Weight Bearing Percentage or Pounds: 50   Pertinent Vitals/Pain "10/10" no acute distress noted. Pt premedicated.       Mobility  Bed Mobility Bed Mobility: Supine to Sit;Sitting - Scoot to Edge of Bed Supine to Sit: 6: Modified independent (Device/Increase time) Sitting - Scoot to Edge of Bed: 6: Modified independent (Device/Increase time) Details for Bed Mobility Assistance: increased time due to pain;  uses bil UEs to (A) Lt LE  Transfers Transfers: Sit to Stand;Stand Pivot Transfers;Stand to Sit Sit to Stand: 5: Supervision;From bed;From chair/3-in-1;4: Min guard;With upper extremity assist Stand to Sit: 5: Supervision;To chair/3-in-1;To toilet;4: Min guard Stand Pivot Transfers: 4: Min guard Details for Transfer Assistance: pt requires min guard when transfering to lower surfaces; supervision for safety and cues for hand placement and technique; increased stability when transfering SPT with RW; demo good ability to maintain NWB status   Ambulation/Gait Ambulation/Gait Assistance: 5: Supervision Ambulation Distance (Feet): 200 Feet Assistive device: Other (Comment) (knee walker) Ambulation/Gait Assistance Details: pt demo good technique and safety awareness with knee walker; applied brakes when necessary; pt unsteady attempting to amb with crutches; encouraged knee walker for household and wheelchair as needed for incr distances  Gait Pattern:  (knee walker; NWB Lt LE) Gait velocity: WFL on knee walker  Stairs: Yes Stairs Assistance: 4: Min guard Stairs Assistance Details (indicate cue type and reason): educated and gave pt handout on stair ambulation technique; spoke with sister on proper guarding technique; sister reported pt would have increased (A) to (A) with stairs. pt demo good ability to maintain NWB status on Lt LE; cues for safety  Stair Management Technique: No rails;Step to pattern;Backwards;With walker Number of Stairs: 3 Wheelchair Mobility Wheelchair Mobility: No         PT Diagnosis: Difficulty walking;Acute pain  PT Problem List: Decreased mobility;Decreased knowledge of use of DME;Pain;Decreased balance PT Treatment Interventions: DME instruction;Gait training;Functional mobility training;Therapeutic activities;Stair training;Therapeutic exercise;Balance training;Neuromuscular re-education;Patient/family education     PT Goals(Current goals can be found in the care  plan section) Acute  Rehab PT Goals Patient Stated Goal: to go home today PT Goal Formulation: With patient Time For Goal Achievement: 10/31/13 Potential to Achieve Goals: Good  Visit Information  Last PT Received On: 10/24/13 Assistance Needed: +1 PT/OT Co-Evaluation/Treatment: Yes History of Present Illness: s/p ORIF Lt ankle        Prior Functioning  Home Living Family/patient expects to be discharged to:: Private residence Living Arrangements: Other relatives Available Help at Discharge: Family;Available 24 hours/day Type of Home: House Home Access: Stairs to enter [employer] of Steps: 3 Entrance Stairs-Rails: None Home Layout: One level Home Equipment: Crutches;Other (comment) (knee walker) Additional Comments: pt was using knee walker in house and crutches to amb up/down stairs prior to surgery, after fall.  has standard toilet seat height  Prior Function Level of Independence: Independent with assistive device(s) Comments: using AD after fall Communication Communication: No difficulties Dominant Hand: Right    Cognition  Cognition Arousal/Alertness: Awake/alert Behavior During Therapy: WFL for tasks assessed/performed Overall Cognitive Status: Within Functional Limits for tasks assessed    Extremity/Trunk Assessment Upper Extremity Assessment Upper Extremity Assessment: Defer to OT evaluation Lower Extremity Assessment Lower Extremity Assessment: LLE deficits/detail LLE Deficits / Details: c/o knee pain; hip/knee WFL for strength  LLE: Unable to fully assess due to pain;Unable to fully assess due to immobilization Cervical / Trunk Assessment Cervical / Trunk Assessment: Normal   Balance Balance Balance Assessed: Yes Static Sitting Balance Static Sitting - Balance Support: Feet supported;No upper extremity supported (Rt LE supported) Static Sitting - Level of Assistance: 7: Independent  End of Session PT - End of Session Equipment Utilized During  Treatment: Gait belt Activity Tolerance: Patient tolerated treatment well Patient left: in chair;with call bell/phone within reach Nurse Communication: Mobility status;Other (comment) (DME needs)  GP Functional Assessment Tool Used: clinical judgement  Functional Limitation: Mobility: Walking and moving around Mobility: Walking and Moving Around Current Status [phone]): At least 1 percent but less than 20 percent impaired, limited or restricted Mobility: Walking and Moving Around Goal Status [phone]): At least 1 percent but less than 20 percent impaired, limited or restricted Mobility: Walking and Moving Around Discharge Status [phone]): At least 1 percent but less than 20 percent impaired, limited or restricted   Donell Sievert, [place] [phone] 10/24/2013, 10:27 AM

## 2013-10-24 NOTE — Evaluation (Signed)
Occupational Therapy Evaluation and Discharge Patient Details Name: Kelly Kennedy MRN: [id number] DOB: 07-06-1941 Today's Date: 10/24/2013 Time: 4098-1191 OT Time Calculation (min): 23 min  OT Assessment / Plan / Recommendation History of present illness s/p ORIF Lt ankle    Clinical Impression   This 72 yo female presents to acute OT with all education completed, will D/C from acute OT.    OT Assessment  Patient does not need any further OT services    Follow Up Recommendations  No OT follow up       Equipment Recommendations  Wheelchair cushion (measurements OT);Wheelchair (measurements OT) (elevating leg rests)          Precautions / Restrictions Precautions Precautions: Fall Restrictions Weight Bearing Restrictions: Yes LLE Weight Bearing: Non weight bearing   Pertinent Vitals/Pain 10/10 at end of session; RN made aware and repositioned her leg    ADL  Eating/Feeding: Independent Where Assessed - Eating/Feeding: Edge of bed Grooming: Set up Where Assessed - Grooming: Unsupported sitting Upper Body Bathing: Set up Where Assessed - Upper Body Bathing: Unsupported sitting Lower Body Bathing: Min guard Where Assessed - Lower Body Bathing: Supported sit to stand Upper Body Dressing: Set up Where Assessed - Upper Body Dressing: Unsupported sitting Lower Body Dressing: Min guard Where Assessed - Lower Body Dressing: Supported sit to Pharmacist, hospital: Hydrographic surveyor Method: Sit to Barista:  Hydrologist (stand pivot)) Toileting - Architect and Hygiene: Min guard Where Assessed - Engineer, mining and Hygiene: Sit to stand from 3-in-1 or toilet Equipment Used: Gait belt;Rolling walker (leg walker) Transfers/Ambulation Related to ADLs: min guard A with RW ADL Comments: Sister is there to A her prn; called and spoke with sister who says she has a RW and 3n1, but needs a W/C. Sister is also aware  that pt will need two people to get in the house (one to steady her walker as she hops up the steps and one behind her to help steady her if needed)     Acute Rehab OT Goals Patient Stated Goal: to go home today  Visit Information  Last OT Received On: 10/24/13 Assistance Needed: +1 PT/OT Co-Evaluation/Treatment: Yes History of Present Illness: s/p ORIF Lt ankle        Prior Functioning     Home Living Family/patient expects to be discharged to:: Private residence Living Arrangements: Other relatives Available Help at Discharge: Family;Available 24 hours/day Type of Home: House Home Access: Stairs to enter [employer] of Steps: 3 Entrance Stairs-Rails: None Home Layout: One level Home Equipment: Crutches;Other (comment) (knee walker) Additional Comments: pt was using knee walker in house and crutches to amb up/down stairs prior to surgery, after fall.  has standard toilet seat height  Prior Function Level of Independence: Independent with assistive device(s) Comments: using AD after fall Communication Communication: No difficulties Dominant Hand: Right         Vision/Perception Vision - History Patient Visual Report: No change from baseline   Cognition  Cognition Arousal/Alertness: Awake/alert Behavior During Therapy: WFL for tasks assessed/performed Overall Cognitive Status: Within Functional Limits for tasks assessed    Extremity/Trunk Assessment Upper Extremity Assessment Upper Extremity Assessment: Overall WFL for tasks assessed Lower Extremity Assessment Lower Extremity Assessment: LLE deficits/detail LLE Deficits / Details: c/o knee pain; hip/knee WFL for strength  LLE: Unable to fully assess due to pain;Unable to fully assess due to immobilization Cervical / Trunk Assessment Cervical / Trunk Assessment: Normal  Mobility Bed Mobility Bed Mobility: Supine to Sit;Sitting - Scoot to Edge of Bed Supine to Sit: 6: Modified independent  (Device/Increase time) Sitting - Scoot to Edge of Bed: 6: Modified independent (Device/Increase time) Details for Bed Mobility Assistance: increased time due to pain; uses bil UEs to (A) Lt LE  Transfers Sit to Stand: 5: Supervision;From bed;From chair/3-in-1;4: Min guard;With upper extremity assist Stand to Sit: 5: Supervision;To chair/3-in-1;To toilet;4: Min guard Details for Transfer Assistance: pt requires min guard when transfering to lower surfaces; supervision for safety and cues for hand placement and technique; increased stability when transfering SPT with RW; demo good ability to maintain NWB status          Balance Balance Balance Assessed: Yes Static Sitting Balance Static Sitting - Balance Support: Feet supported;No upper extremity supported (Rt LE supported) Static Sitting - Level of Assistance: 7: Independent   End of Session OT - End of Session Equipment Utilized During Treatment: Gait belt;Rolling walker Activity Tolerance: Patient tolerated treatment well Patient left: in chair;with call bell/phone within reach Nurse Communication: Mobility status (Pt will need a W/C with elevating leg rests)  GO Functional Assessment Tool Used: Clincal observation Functional Limitation: Self care Self Care Current Status (Z6109): At least 1 percent but less than 20 percent impaired, limited or restricted Self Care Goal Status (U0454): At least 1 percent but less than 20 percent impaired, limited or restricted Self Care Discharge Status [phone]): At least 1 percent but less than 20 percent impaired, limited or restricted   Evette Georges [phone] 10/24/2013, 11:08 AM

## 2013-10-24 NOTE — Discharge Instructions (Signed)
Nonweightbearing left ankle. Keep foot elevated level with the heart. Keep dressing clean and dry.

## 2013-10-24 NOTE — Discharge Summary (Signed)
Physician Discharge Summary  Patient ID: Kelly Kennedy MRN: [id number] DOB/AGE: 1941-08-23 72 y.o.  Admit date: 10/23/2013 Discharge date: 10/24/2013  Admission Diagnoses: Left ankle fracture  Discharge Diagnoses: Left ankle fracture Principal Problem:   Ankle fracture, left Active Problems:   Ankle fracture   Discharged Condition: stable  Hospital Course: Patient's hospital course was essentially unremarkable. She underwent open reduction internal fixation of the left ankle fracture. Postoperatively she progressed well and was discharged to home after physical therapy.  Consults: None  Significant Diagnostic Studies: labs: Routine labs  Treatments: surgery: See operative note  Discharge Exam: Blood pressure 117/52, pulse 101, temperature 99 F (37.2 C), temperature source Oral, resp. rate 16, height 5' 2.99" (1.6 m), weight 83.2 kg (183 lb 6.8 oz), SpO2 99.00%. Incision/Wound: clean and dry  Disposition: 01-Home or Self Care  Discharge Orders   Future Orders Complete By Expires   Call MD / Call 911  As directed    Comments:     If you experience chest pain or shortness of breath, CALL 911 and be transported to the hospital emergency room.  If you develope a fever above 101 F, pus (white drainage) or increased drainage or redness at the wound, or calf pain, call your surgeon's office.   Constipation Prevention  As directed    Comments:     Drink plenty of fluids.  Prune juice may be helpful.  You may use a stool softener, such as Colace (over the counter) 100 mg twice a day.  Use MiraLax (over the counter) for constipation as needed.   Diet - low sodium heart healthy  As directed    Increase activity slowly as tolerated  As directed    Non weight bearing  As directed    Questions:     Laterality:     Extremity:         Medication List         acetaminophen 500 MG tablet  Commonly known as:  TYLENOL  Take 500 mg by mouth every 6 (six) hours as needed for mild  pain.     amitriptyline 50 MG tablet  Commonly known as:  ELAVIL  Take 50-100 mg by mouth at bedtime as needed for sleep.     aspirin EC 325 MG tablet  Take 1 tablet (325 mg total) by mouth 2 (two) times daily.     aspirin EC 81 MG tablet  Take 81 mg by mouth daily.     BC HEADACHE PO  Take 1 packet by mouth daily as needed (headache).     estradiol 0.1 mg/24hr patch  Commonly known as:  CLIMARA - Dosed in mg/24 hr  Place 1 patch (0.1 mg total) onto the skin once a week.     furosemide 20 MG tablet  Commonly known as:  LASIX  Take 1 tablet (20 mg total) by mouth daily.     GENTEAL OP  Apply 1 application to eye at bedtime.     HYDROcodone-acetaminophen 5-325 MG per tablet  Commonly known as:  NORCO/VICODIN  Take 2 tablets by mouth every 4 (four) hours as needed for moderate pain.     ketotifen 0.025 % ophthalmic solution  Commonly known as:  ZADITOR  Place 1 drop into both eyes daily.     levothyroxine 100 MCG tablet  Commonly known as:  SYNTHROID, LEVOTHROID  Take one tablet by mouth daily except on Monday and Wednesday take 1/2 tablet     losartan 100 MG  tablet  Commonly known as:  COZAAR  Take 1 tablet (100 mg total) by mouth daily. To replace lisinopril rx     nitroGLYCERIN 0.4 MG SL tablet  Commonly known as:  NITROSTAT  Place 0.4 mg under the tongue every 5 (five) minutes as needed for chest pain.     OPTIVE 0.5-0.9 % Soln  Generic drug:  Carboxymethylcellul-Glycerin  Apply 1 drop to eye 2 (two) times daily as needed (for dry eyes).     OVER THE COUNTER MEDICATION  Take 1 tablet by mouth daily. Eye Vitamin     oxyCODONE 5 MG immediate release tablet  Commonly known as:  Oxy IR/ROXICODONE  Take 1-3 tablets (5-15 mg total) by mouth every 4 (four) hours as needed.     promethazine 25 MG tablet  Commonly known as:  PHENERGAN  Take 1 tablet (25 mg total) by mouth every 6 (six) hours as needed for nausea.     rosuvastatin 10 MG tablet  Commonly known as:   CRESTOR  Take 1 tablet (10 mg total) by mouth daily.     triamcinolone ointment 0.1 %  Commonly known as:  KENALOG  Apply 1 application topically 2 (two) times daily.           Follow-up Information   Follow up with Cheral Almas, MD In 1 week.   Specialty:  Orthopedic Surgery   Contact information:   [address] [place] [address] [phone]       Signed: Nadara Mustard 10/24/2013, 8:10 AM

## 2013-10-25 NOTE — Progress Notes (Signed)
   CARE MANAGEMENT NOTE 10/25/2013  Patient:  Kelly Kennedy, Kelly Kennedy   Account Number:  [id number]  Date Initiated:  10/25/2013  Documentation initiated by:  [facility]  Subjective/Objective Assessment:   left ankle fracture     Action/Plan:   lives at home with family   Anticipated DC Date:  10/25/2013   Anticipated DC Plan:  HOME W HOME HEALTH SERVICES      DC Planning Services  CM consult      [facility] Choice  HOME HEALTH   Choice offered to / List presented to:  C-1 Patient   DME arranged  WHEELCHAIR - MANUAL      DME agency  [facility]     HH arranged  HH-2 PT      [facility] agency  [facility]   Status of service:  Completed, signed off Medicare Important Message given?   (If response is "NO", the following Medicare IM given date fields will be blank) Date Medicare IM given:   Date Additional Medicare IM given:    Discharge Disposition:  HOME W HOME HEALTH SERVICES  Per UR Regulation:    If discussed at Long Length of Stay Meetings, dates discussed:    Comments:  10/25/2013 1100 NCM contacted [facility] and referral received for DME. Contacted pt and she gave permisison to speak with sister. Provided sister with [facility] contact info. Explained [facility] will do HH and provide DME. Isidoro Donning RN CCM Case Mgmt phone [phone]  10/25/2013 1000 NCM contacted pt at home. Pt gave permission to speak to grand-dtr, [name]. Offered choice for [facility]. [facility] was first choice and [facility] second choice. [facility] has no availability for HHPT at this time. [name] states pt will need wheelchair at home and [facility] PT. Contacted MD for order for [facility] and DME. Spoke to Dr. Lajoyce Corners for orders. Contacted [facility] for [facility] and faxed orders for DME to [facility] for deliver to home. Isidoro Donning RN CCM Case Mgmt phone [phone]

## 2013-10-25 NOTE — Care Management Utilization Note (Signed)
Utilization review completed. Anarely Nicholls, RN BSN 

## 2013-10-28 ENCOUNTER — Encounter (HOSPITAL_COMMUNITY): Payer: Self-pay | Admitting: Orthopaedic Surgery

## 2013-11-06 ENCOUNTER — Telehealth: Payer: Self-pay

## 2013-11-06 NOTE — Telephone Encounter (Signed)
Left message on voicemail to call back. Message on voicemail states that you have reached Kelly Kennedy. Rena rechecked number and verified the number.

## 2013-11-06 NOTE — Telephone Encounter (Signed)
Onalee Hua PT with [facility] left v/m; pt still having issues of lt knee giving out and Onalee Hua wants to work more on strengthening leg muscles; pt had fx to fibula. David request verbal order for extension of PT home health 1 x a week for 3 weeks.Please advise.

## 2013-11-06 NOTE — Telephone Encounter (Signed)
Please give the verbal.  Thanks.  

## 2013-11-07 NOTE — Telephone Encounter (Signed)
Message on VM does say you have reached Kelly Kennedy (as documented below).  I asked this person to please return our call to let us know if we have the incorrect number.

## 2013-11-20 NOTE — Telephone Encounter (Signed)
I was unable to reach Onalee Hua so I called [facility] in [place] and was given another # for Onalee Hua [phone]; left detailed v/m for Onalee Hua as instructed.

## 2014-01-21 ENCOUNTER — Ambulatory Visit: Payer: Self-pay | Admitting: Psychology

## 2014-02-07 ENCOUNTER — Ambulatory Visit (INDEPENDENT_AMBULATORY_CARE_PROVIDER_SITE_OTHER): Payer: Medicare HMO | Admitting: Family Medicine

## 2014-02-07 ENCOUNTER — Encounter: Payer: Self-pay | Admitting: Family Medicine

## 2014-02-07 VITALS — BP 128/74 | HR 92 | Temp 97.8°F

## 2014-02-07 DIAGNOSIS — R609 Edema, unspecified: Secondary | ICD-10-CM

## 2014-02-07 NOTE — Patient Instructions (Signed)
I would elevate your leg and skip PT for now.  This should get better.  If not better by Monday the call me.  We may need to do an ultrasound at that point.  Take care.

## 2014-02-07 NOTE — Progress Notes (Signed)
Pre visit review using our clinic review tool, if applicable. No additional management support is needed unless otherwise documented below in the visit note.  Had fallen, needed ORIF for ankle fx.  Was splinted, then casted, then booted. In PT recently.  Swelling in the L foot got worse yesterday, in spite of icing.  Baseline swelling in the R foot, not new, this is at baseline. Less pain in foot now in the boot.  No SOB, no CP.   Was prev on ASA 325, now on 81mg .  Worse swelling after PT.  Advised to come in today for eval.    Meds, vitals, and allergies reviewed.   ROS: See HPI.  Otherwise, noncontributory.  nad ncat Mmm rrr ctab abd soft, not ttp Ext with baseline edema in the ankles, B.  38cm calf B, no bands or cords.   Calf not ttp B

## 2014-02-08 NOTE — Assessment & Plan Note (Signed)
Likely from baseline edema, lack of elevation, PT and strain.  This is unlikely to represent DVT and I didn't order D dimer as it would likely be falsely elevated.  We can consider u/s if sx continue, but I would have her just elevate her legs in the meantime.  No PT over the weekend. She agrees.

## 2014-03-07 ENCOUNTER — Other Ambulatory Visit: Payer: Self-pay

## 2014-03-07 DIAGNOSIS — Z1231 Encounter for screening mammogram for malignant neoplasm of breast: Secondary | ICD-10-CM

## 2014-03-14 ENCOUNTER — Ambulatory Visit (INDEPENDENT_AMBULATORY_CARE_PROVIDER_SITE_OTHER): Payer: Medicare HMO | Admitting: Family Medicine

## 2014-03-14 ENCOUNTER — Encounter: Payer: Self-pay | Admitting: Family Medicine

## 2014-03-14 VITALS — BP 116/66 | HR 96 | Temp 98.2°F | Wt 184.2 lb

## 2014-03-14 DIAGNOSIS — J019 Acute sinusitis, unspecified: Secondary | ICD-10-CM

## 2014-03-14 MED ORDER — FLUCONAZOLE 150 MG PO TABS: 150.0000 mg | ORAL_TABLET | Freq: Once | ORAL | Status: DC

## 2014-03-14 MED ORDER — TRIAMCINOLONE ACETONIDE 0.1 % EX CREA: 1.0000 "application " | TOPICAL_CREAM | Freq: Two times a day (BID) | CUTANEOUS | Status: DC

## 2014-03-14 MED ORDER — AZITHROMYCIN 250 MG PO TABS: ORAL_TABLET | ORAL | Status: DC

## 2014-03-14 NOTE — Patient Instructions (Signed)
This looks like resolving aphthous ulcers.   Get over the counter oragel and use it up to 4 times a day of the sore spots.  Then put the steroid cream on the area twice a day.

## 2014-03-14 NOTE — Progress Notes (Signed)
Pre visit review using our clinic review tool, if applicable. No additional management support is needed unless otherwise documented below in the visit note.  L leg edema is better now.    Mouth ulcers/sores. No new foods except for some strawberries.  Had tolerated them prev, no know allergies. Swallowing okay, no FCVD.  Some rhinorrhea, likely after pollen exposure. No ear pain. No other skin lesions.  Maxillary pain B.   Meds, vitals, and allergies reviewed.   ROS: See HPI.  Otherwise, noncontributory.  GEN: nad, alert and oriented HEENT: mucous membranes moist, tm w/o erythema, nasal exam w/o erythema, clear discharge noted,  OP with cobblestoning, max sinuses ttp, resolving oral aphthous ulcers noted.  NECK: supple w/o LA CV: rrr.   PULM: ctab, no inc wob EXT: no edema SKIN: no acute rash

## 2014-03-16 DIAGNOSIS — J0110 Acute frontal sinusitis, unspecified: Secondary | ICD-10-CM | POA: Insufficient documentation

## 2014-03-16 NOTE — Assessment & Plan Note (Signed)
Nontoxic, d/w pt.  Zithromax, use diflucan if she gets a yeast infection.  Likely resolving aphthous ulcers, use oragel and TAC prn.   F/u prn.

## 2014-03-26 ENCOUNTER — Ambulatory Visit
Admission: RE | Admit: 2014-03-26 | Discharge: 2014-03-26 | Disposition: A | Discharge status: Home / Self Care | Payer: Commercial Managed Care - HMO | Source: Ambulatory Visit

## 2014-03-26 ENCOUNTER — Encounter (INDEPENDENT_AMBULATORY_CARE_PROVIDER_SITE_OTHER): Payer: Self-pay

## 2014-03-26 DIAGNOSIS — Z1231 Encounter for screening mammogram for malignant neoplasm of breast: Secondary | ICD-10-CM

## 2014-03-27 ENCOUNTER — Encounter: Payer: Self-pay | Admitting: *Deleted

## 2014-05-19 ENCOUNTER — Telehealth: Payer: Self-pay | Admitting: Family Medicine

## 2014-05-19 NOTE — Telephone Encounter (Signed)
Opened in error

## 2014-06-23 ENCOUNTER — Other Ambulatory Visit: Payer: Self-pay | Admitting: Family Medicine

## 2014-06-23 DIAGNOSIS — I10 Essential (primary) hypertension: Secondary | ICD-10-CM

## 2014-06-27 ENCOUNTER — Other Ambulatory Visit (INDEPENDENT_AMBULATORY_CARE_PROVIDER_SITE_OTHER): Payer: Commercial Managed Care - HMO

## 2014-06-27 DIAGNOSIS — I10 Essential (primary) hypertension: Secondary | ICD-10-CM

## 2014-06-27 LAB — COMPREHENSIVE METABOLIC PANEL
ALK PHOS: 58 U/L (ref 39–117)
ALT: 31 U/L (ref 0–35)
AST: 32 U/L (ref 0–37)
Albumin: 4.1 g/dL (ref 3.5–5.2)
BUN: 15 mg/dL (ref 6–23)
CO2: 28 mEq/L (ref 19–32)
CREATININE: 0.9 mg/dL (ref 0.4–1.2)
Calcium: 9.3 mg/dL (ref 8.4–10.5)
Chloride: 106 mEq/L (ref 96–112)
GFR: 80.1 mL/min (ref >60.00)
Glucose, Bld: 129 mg/dL — ABNORMAL HIGH (ref 70–99)
POTASSIUM: 4 meq/L (ref 3.5–5.1)
Sodium: 139 mEq/L (ref 135–145)
Total Bilirubin: 0.5 mg/dL (ref 0.2–1.2)
Total Protein: 7.2 g/dL (ref 6.0–8.3)

## 2014-06-27 LAB — LDL CHOLESTEROL, DIRECT: Direct LDL: 70 mg/dL

## 2014-06-27 LAB — TSH: TSH: 0.65 u[IU]/mL (ref 0.35–4.50)

## 2014-06-27 LAB — LIPID PANEL
CHOL/HDL RATIO: 3
Cholesterol: 143 mg/dL (ref 0–200)
HDL: 52.1 mg/dL (ref >39.00)
NONHDL: 90.9
Triglycerides: 243 mg/dL — ABNORMAL HIGH (ref 0.0–149.0)
VLDL: 48.6 mg/dL — ABNORMAL HIGH (ref 0.0–40.0)

## 2014-06-30 ENCOUNTER — Other Ambulatory Visit: Payer: Self-pay | Admitting: Family Medicine

## 2014-06-30 DIAGNOSIS — R739 Hyperglycemia, unspecified: Secondary | ICD-10-CM

## 2014-07-04 ENCOUNTER — Other Ambulatory Visit: Payer: Self-pay | Admitting: Family Medicine

## 2014-07-04 ENCOUNTER — Telehealth: Payer: Self-pay | Admitting: *Deleted

## 2014-07-04 ENCOUNTER — Encounter: Payer: Self-pay | Admitting: Family Medicine

## 2014-07-04 ENCOUNTER — Ambulatory Visit (INDEPENDENT_AMBULATORY_CARE_PROVIDER_SITE_OTHER): Payer: Commercial Managed Care - HMO | Admitting: Family Medicine

## 2014-07-04 VITALS — BP 130/80 | HR 98 | Temp 97.9°F | Ht 63.0 in | Wt 191.2 lb

## 2014-07-04 DIAGNOSIS — E039 Hypothyroidism, unspecified: Secondary | ICD-10-CM

## 2014-07-04 DIAGNOSIS — H539 Unspecified visual disturbance: Secondary | ICD-10-CM

## 2014-07-04 DIAGNOSIS — Z780 Asymptomatic menopausal state: Secondary | ICD-10-CM

## 2014-07-04 DIAGNOSIS — R7309 Other abnormal glucose: Secondary | ICD-10-CM

## 2014-07-04 DIAGNOSIS — R739 Hyperglycemia, unspecified: Secondary | ICD-10-CM

## 2014-07-04 DIAGNOSIS — Z0000 Encounter for general adult medical examination without abnormal findings: Secondary | ICD-10-CM

## 2014-07-04 DIAGNOSIS — Z7189 Other specified counseling: Secondary | ICD-10-CM

## 2014-07-04 DIAGNOSIS — E2839 Other primary ovarian failure: Secondary | ICD-10-CM

## 2014-07-04 DIAGNOSIS — Z23 Encounter for immunization: Secondary | ICD-10-CM

## 2014-07-04 DIAGNOSIS — E782 Mixed hyperlipidemia: Secondary | ICD-10-CM

## 2014-07-04 DIAGNOSIS — F411 Generalized anxiety disorder: Secondary | ICD-10-CM

## 2014-07-04 DIAGNOSIS — N951 Menopausal and female climacteric states: Secondary | ICD-10-CM

## 2014-07-04 DIAGNOSIS — S82899A Other fracture of unspecified lower leg, initial encounter for closed fracture: Secondary | ICD-10-CM

## 2014-07-04 DIAGNOSIS — S82892A Other fracture of left lower leg, initial encounter for closed fracture: Secondary | ICD-10-CM

## 2014-07-04 LAB — HEMOGLOBIN A1C: Hgb A1c MFr Bld: 6.9 % — ABNORMAL HIGH (ref 4.6–6.5)

## 2014-07-04 MED ORDER — POTASSIUM CHLORIDE ER 10 MEQ PO TBCR
10.0000 meq | EXTENDED_RELEASE_TABLET | Freq: Every day | ORAL | Status: DC
Start: 2014-07-04 — End: 2014-10-09

## 2014-07-04 MED ORDER — HYDROCODONE-ACETAMINOPHEN 5-325 MG PO TABS: 2.0000 | ORAL_TABLET | ORAL | Status: DC | PRN

## 2014-07-04 MED ORDER — ROSUVASTATIN CALCIUM 10 MG PO TABS: 10.0000 mg | ORAL_TABLET | Freq: Every day | ORAL | Status: DC

## 2014-07-04 MED ORDER — ESTRADIOL 0.1 MG/24HR TD PTWK: 0.1000 mg | MEDICATED_PATCH | TRANSDERMAL | Status: DC

## 2014-07-04 MED ORDER — LOSARTAN POTASSIUM 100 MG PO TABS: 100.0000 mg | ORAL_TABLET | Freq: Every day | ORAL | Status: DC

## 2014-07-04 MED ORDER — AMITRIPTYLINE HCL 50 MG PO TABS: 50.0000 mg | ORAL_TABLET | Freq: Every evening | ORAL | Status: DC | PRN

## 2014-07-04 MED ORDER — LEVOTHYROXINE SODIUM 100 MCG PO TABS: ORAL_TABLET | ORAL | Status: DC

## 2014-07-04 MED ORDER — FUROSEMIDE 20 MG PO TABS: 20.0000 mg | ORAL_TABLET | Freq: Every day | ORAL | Status: DC

## 2014-07-04 NOTE — Patient Instructions (Addendum)
Check with your insurance to see if they will cover the shingles shot. Go to the lab on the way out.  We'll contact you with your lab report about your sugar.   Kelly Kennedy will call about your referral. I would get a flu shot each fall.   Take care. Glad to see you.

## 2014-07-04 NOTE — Progress Notes (Signed)
Pre visit review using our clinic review tool, if applicable. No additional management support is needed unless otherwise documented below in the visit note.  I have personally reviewed the Medicare Annual Wellness questionnaire and have noted 1. The patient's medical and social history 2. Their use of alcohol, tobacco or illicit drugs 3. Their current medications and supplements 4. The patient's functional ability including ADL's, fall risks, home safety risks and hearing or visual             impairment. 5. Diet and physical activities 6. Evidence for depression or mood disorders  The patients weight, height, BMI have been recorded in the chart and visual acuity is per eye clinic.  I have made referrals, counseling and provided education to the patient based review of the above and I have provided the pt with a written personalized care plan for preventive services.  Provider list updated- see scanned forms.  Routine anticipatory guidance given to patient.  See health maintenance.  Flu 2014 Shingles d/w pt.  PNA 2012 Tetanus 2008 Colonoscopy 2014 Breast cancer screening up to date.  DXA d/w pt.  Due.  Ordered.  Advance directive- daughter designated if patient were incapacitated.  Cognitive function addressed- see scanned forms- and if abnormal then additional documentation follows.  She needed referral for eye exam, done at OV.    HRT.  D/w pt.  Sig hot flashes.  She is tapering off HRT, now using ~2 patches per month.  She is slowly decreasing her dose.  D/w pt.  She wants to continue slow taper with goal of full cessation.    Hypothyroid.  Compliant with meds.  No ADE.  No neck mass.  Labs d/w pt.  TSH wnl.  Elevated sugar, d/w pt.  See notes on f/u A1c.    Hypertension:    Using medication without problems or lightheadedness: yes Chest pain with exertion:no Edema:no Short of breath: occ, with exertion, likely age appropriate.   Elevated Cholesterol: Using medications  without problems:yes Muscle aches: no Diet compliance:d/w  Exercise: encouraged more.   Ankle pain, rare use of hydrocodone, used with occ flares.  Not recently used, but needed refill.  Doing well o/w.    Mood d/w pt.  Sig social upheaval prev, but mood controlled with TCA, occ low feelings but no SI/HI and the med helps with depressive sx.  She wants to continue as is.    PMH and SH reviewed  Meds, vitals, and allergies reviewed.   ROS: See HPI.  Otherwise negative.    GEN: nad, alert and oriented HEENT: mucous membranes moist NECK: supple w/o LA CV: rrr. PULM: ctab, no inc wob ABD: soft, +bs EXT: no edema SKIN: no acute rash

## 2014-07-04 NOTE — Telephone Encounter (Signed)
Drug notification in your IN box for review

## 2014-07-06 ENCOUNTER — Other Ambulatory Visit: Payer: Self-pay | Admitting: Family Medicine

## 2014-07-06 DIAGNOSIS — E119 Type 2 diabetes mellitus without complications: Secondary | ICD-10-CM

## 2014-07-06 DIAGNOSIS — Z7189 Other specified counseling: Secondary | ICD-10-CM | POA: Insufficient documentation

## 2014-07-06 NOTE — Assessment & Plan Note (Signed)
TG up, see notes on labs re: A1c, continue statin, work on diet/weight.

## 2014-07-06 NOTE — Assessment & Plan Note (Signed)
Continue prn hydrocodone, she has weaned down, can use for occ flare of pain.  Routine opiate cautions given.

## 2014-07-06 NOTE — Assessment & Plan Note (Signed)
Continue TCA, okay for outpatient f/u.

## 2014-07-06 NOTE — Assessment & Plan Note (Signed)
Flu 2014 Shingles d/w pt.  PNA 2012 Tetanus 2008 Colonoscopy 2014 Breast cancer screening up to date.  DXA d/w pt.  Due.  Ordered.  Advance directive- daughter designated if patient were incapacitated.  Cognitive function addressed- see scanned forms- and if abnormal then additional documentation follows.

## 2014-07-06 NOTE — Assessment & Plan Note (Signed)
See notes on A1c, now with Dm2, diet controlled.  No meds yet.

## 2014-07-06 NOTE — Assessment & Plan Note (Signed)
tsh wnl, continue as is.  D/w pt.  

## 2014-07-06 NOTE — Telephone Encounter (Signed)
Form done, no change in meds.

## 2014-07-06 NOTE — Assessment & Plan Note (Signed)
Continue slow taper of HRT, d/w pt.

## 2014-07-07 ENCOUNTER — Other Ambulatory Visit: Payer: Self-pay

## 2014-07-07 ENCOUNTER — Other Ambulatory Visit: Payer: Self-pay | Admitting: Family Medicine

## 2014-07-07 DIAGNOSIS — E2839 Other primary ovarian failure: Secondary | ICD-10-CM

## 2014-07-09 ENCOUNTER — Ambulatory Visit
Admission: RE | Admit: 2014-07-09 | Discharge: 2014-07-09 | Disposition: A | Discharge status: Home / Self Care | Payer: Commercial Managed Care - HMO | Source: Ambulatory Visit | Attending: Family Medicine | Admitting: Family Medicine

## 2014-07-09 ENCOUNTER — Encounter: Payer: Self-pay | Admitting: Family Medicine

## 2014-07-09 DIAGNOSIS — M8580 Other specified disorders of bone density and structure, unspecified site: Secondary | ICD-10-CM | POA: Insufficient documentation

## 2014-07-09 DIAGNOSIS — E2839 Other primary ovarian failure: Secondary | ICD-10-CM

## 2014-07-17 ENCOUNTER — Other Ambulatory Visit: Payer: Self-pay | Admitting: Family Medicine

## 2014-07-18 ENCOUNTER — Other Ambulatory Visit: Payer: Self-pay | Admitting: *Deleted

## 2014-07-21 ENCOUNTER — Telehealth: Payer: Self-pay

## 2014-07-21 NOTE — Telephone Encounter (Signed)
I doubt it is from the potassium. She can stay off it a day or two but then I'd restart it.  If she has more blood in stool, then we need to know about it. Thanks.

## 2014-07-21 NOTE — Telephone Encounter (Signed)
Patient advised.  Patient states she cannot restart the Potassium because she had the worst headache she'd ever had when she took it and she is convinced that is why she had blood in her stool.  She will try to increase potassium rich foods and talk with you about it at her next OV.

## 2014-07-21 NOTE — Telephone Encounter (Signed)
Pt took Potassium 10 meq on 07/17/14 or 07/18/14 pt had h/a and streaks of  blood in BM x 1. Pt has not seen any more blood in BM last BM this morning was normal. Pt only took the one potassium. H/a stopped on Sat. No h/a now. Pt thinks h/a and blood in BM caused by taking potassium. Pt request cb. [facility].

## 2014-07-22 NOTE — Telephone Encounter (Signed)
Noted  

## 2014-07-24 ENCOUNTER — Other Ambulatory Visit: Payer: Self-pay | Admitting: *Deleted

## 2014-07-24 LAB — HM DIABETES EYE EXAM

## 2014-08-06 ENCOUNTER — Encounter: Payer: Self-pay | Admitting: Family Medicine

## 2014-08-13 ENCOUNTER — Other Ambulatory Visit: Payer: Self-pay | Admitting: Family Medicine

## 2014-08-13 NOTE — Telephone Encounter (Signed)
Last office visit 07/04/2014.  Ok to refill?

## 2014-08-13 NOTE — Telephone Encounter (Signed)
Sent. Thanks.   

## 2014-08-14 ENCOUNTER — Other Ambulatory Visit: Payer: Self-pay | Admitting: Family Medicine

## 2014-09-26 ENCOUNTER — Telehealth: Payer: Self-pay | Admitting: Family Medicine

## 2014-09-26 NOTE — Telephone Encounter (Deleted)
Error

## 2014-09-28 ENCOUNTER — Emergency Department (HOSPITAL_COMMUNITY): Payer: Commercial Managed Care - HMO

## 2014-09-28 ENCOUNTER — Encounter (HOSPITAL_COMMUNITY): Payer: Self-pay | Admitting: Family Medicine

## 2014-09-28 ENCOUNTER — Observation Stay (HOSPITAL_COMMUNITY)
Admission: EM | Admit: 2014-09-28 | Discharge: 2014-09-30 | Disposition: A | Discharge status: Home / Self Care | Payer: Commercial Managed Care - HMO | Attending: Internal Medicine | Admitting: Internal Medicine

## 2014-09-28 DIAGNOSIS — R202 Paresthesia of skin: Secondary | ICD-10-CM | POA: Diagnosis not present

## 2014-09-28 DIAGNOSIS — Z7982 Long term (current) use of aspirin: Secondary | ICD-10-CM | POA: Insufficient documentation

## 2014-09-28 DIAGNOSIS — E039 Hypothyroidism, unspecified: Secondary | ICD-10-CM | POA: Diagnosis not present

## 2014-09-28 DIAGNOSIS — R079 Chest pain, unspecified: Secondary | ICD-10-CM | POA: Diagnosis present

## 2014-09-28 DIAGNOSIS — R0789 Other chest pain: Principal | ICD-10-CM | POA: Insufficient documentation

## 2014-09-28 DIAGNOSIS — N951 Menopausal and female climacteric states: Secondary | ICD-10-CM | POA: Diagnosis not present

## 2014-09-28 DIAGNOSIS — F329 Major depressive disorder, single episode, unspecified: Secondary | ICD-10-CM | POA: Diagnosis not present

## 2014-09-28 DIAGNOSIS — K5730 Diverticulosis of large intestine without perforation or abscess without bleeding: Secondary | ICD-10-CM | POA: Diagnosis not present

## 2014-09-28 DIAGNOSIS — I10 Essential (primary) hypertension: Secondary | ICD-10-CM | POA: Diagnosis present

## 2014-09-28 DIAGNOSIS — E785 Hyperlipidemia, unspecified: Secondary | ICD-10-CM | POA: Insufficient documentation

## 2014-09-28 DIAGNOSIS — G459 Transient cerebral ischemic attack, unspecified: Secondary | ICD-10-CM

## 2014-09-28 DIAGNOSIS — G4700 Insomnia, unspecified: Secondary | ICD-10-CM | POA: Diagnosis not present

## 2014-09-28 DIAGNOSIS — Z87891 Personal history of nicotine dependence: Secondary | ICD-10-CM | POA: Insufficient documentation

## 2014-09-28 DIAGNOSIS — Z7952 Long term (current) use of systemic steroids: Secondary | ICD-10-CM | POA: Insufficient documentation

## 2014-09-28 DIAGNOSIS — K589 Irritable bowel syndrome without diarrhea: Secondary | ICD-10-CM | POA: Diagnosis not present

## 2014-09-28 DIAGNOSIS — E119 Type 2 diabetes mellitus without complications: Secondary | ICD-10-CM

## 2014-09-28 DIAGNOSIS — M1990 Unspecified osteoarthritis, unspecified site: Secondary | ICD-10-CM | POA: Diagnosis not present

## 2014-09-28 DIAGNOSIS — Z79899 Other long term (current) drug therapy: Secondary | ICD-10-CM | POA: Insufficient documentation

## 2014-09-28 DIAGNOSIS — I319 Disease of pericardium, unspecified: Secondary | ICD-10-CM | POA: Diagnosis not present

## 2014-09-28 DIAGNOSIS — K219 Gastro-esophageal reflux disease without esophagitis: Secondary | ICD-10-CM | POA: Insufficient documentation

## 2014-09-28 DIAGNOSIS — Z8673 Personal history of transient ischemic attack (TIA), and cerebral infarction without residual deficits: Secondary | ICD-10-CM | POA: Diagnosis present

## 2014-09-28 DIAGNOSIS — G510 Bell's palsy: Secondary | ICD-10-CM | POA: Diagnosis not present

## 2014-09-28 DIAGNOSIS — Z23 Encounter for immunization: Secondary | ICD-10-CM | POA: Insufficient documentation

## 2014-09-28 DIAGNOSIS — K635 Polyp of colon: Secondary | ICD-10-CM | POA: Insufficient documentation

## 2014-09-28 HISTORY — DX: Type 2 diabetes mellitus without complications: E11.9

## 2014-09-28 LAB — CBC
HEMATOCRIT: 36.5 % (ref 36.0–46.0)
HEMOGLOBIN: 12.2 g/dL (ref 12.0–15.0)
MCH: 28.6 pg (ref 26.0–34.0)
MCHC: 33.4 g/dL (ref 30.0–36.0)
MCV: 85.7 fL (ref 78.0–100.0)
Platelets: 333 10*3/uL (ref 150–400)
RBC: 4.26 MIL/uL (ref 3.87–5.11)
RDW: 14.8 % (ref 11.5–15.5)
WBC: 6.5 10*3/uL (ref 4.0–10.5)

## 2014-09-28 LAB — BASIC METABOLIC PANEL
Anion gap: 12 (ref 5–15)
BUN: 8 mg/dL (ref 6–23)
CO2: 28 mEq/L (ref 19–32)
Calcium: 9.3 mg/dL (ref 8.4–10.5)
Chloride: 102 mEq/L (ref 96–112)
Creatinine, Ser: 0.67 mg/dL (ref 0.50–1.10)
GFR calc Af Amer: >90 mL/min (ref >90)
GFR, EST NON AFRICAN AMERICAN: 86 mL/min — AB (ref >90)
GLUCOSE: 81 mg/dL (ref 70–99)
Potassium: 3.6 mEq/L — ABNORMAL LOW (ref 3.7–5.3)
Sodium: 142 mEq/L (ref 137–147)

## 2014-09-28 LAB — I-STAT TROPONIN, ED: TROPONIN I, POC: 0.01 ng/mL (ref 0.00–0.08)

## 2014-09-28 LAB — PRO B NATRIURETIC PEPTIDE: Pro B Natriuretic peptide (BNP): 36.7 pg/mL (ref 0–125)

## 2014-09-28 MED ORDER — LEVOTHYROXINE SODIUM 50 MCG PO TABS: 50.0000 ug | ORAL_TABLET | Freq: Every day | ORAL | Status: DC

## 2014-09-28 MED ORDER — AMITRIPTYLINE HCL 25 MG PO TABS
50.0000 mg | ORAL_TABLET | Freq: Every evening | ORAL | Status: DC | PRN
  Administered 2014-09-29: 50 mg via ORAL
  Filled 2014-09-28: qty 2

## 2014-09-28 MED ORDER — POTASSIUM CHLORIDE ER 10 MEQ PO TBCR
20.0000 meq | EXTENDED_RELEASE_TABLET | Freq: Every day | ORAL | Status: AC
  Administered 2014-09-29 – 2014-09-30 (×2): 20 meq via ORAL
  Filled 2014-09-28 (×4): qty 2

## 2014-09-28 MED ORDER — ACETAMINOPHEN 325 MG PO TABS: 650.0000 mg | ORAL_TABLET | ORAL | Status: DC | PRN

## 2014-09-28 MED ORDER — LEVOTHYROXINE SODIUM 50 MCG PO TABS
50.0000 ug | ORAL_TABLET | ORAL | Status: DC
  Administered 2014-09-29: 50 ug via ORAL
  Filled 2014-09-28: qty 1

## 2014-09-28 MED ORDER — ASPIRIN 325 MG PO TABS
325.0000 mg | ORAL_TABLET | Freq: Every day | ORAL | Status: DC
  Administered 2014-09-29 – 2014-09-30 (×2): 325 mg via ORAL
  Filled 2014-09-28 (×2): qty 1

## 2014-09-28 MED ORDER — INSULIN ASPART 100 UNIT/ML ~~LOC~~ SOLN: 0.0000 [IU] | SUBCUTANEOUS | Status: DC

## 2014-09-28 MED ORDER — LEVOTHYROXINE SODIUM 100 MCG PO TABS
100.0000 ug | ORAL_TABLET | ORAL | Status: DC
  Administered 2014-09-30: 100 ug via ORAL
  Filled 2014-09-28: qty 1

## 2014-09-28 MED ORDER — LOSARTAN POTASSIUM 50 MG PO TABS
100.0000 mg | ORAL_TABLET | Freq: Every day | ORAL | Status: DC
  Administered 2014-09-29 – 2014-09-30 (×2): 100 mg via ORAL
  Filled 2014-09-28 (×2): qty 2

## 2014-09-28 MED ORDER — INFLUENZA VAC SPLIT QUAD 0.5 ML IM SUSY
0.5000 mL | PREFILLED_SYRINGE | INTRAMUSCULAR | Status: AC
  Administered 2014-09-29: 0.5 mL via INTRAMUSCULAR
  Filled 2014-09-28: qty 0.5

## 2014-09-28 MED ORDER — STROKE: EARLY STAGES OF RECOVERY BOOK
Freq: Once | Status: DC
  Filled 2014-09-28: qty 1

## 2014-09-28 MED ORDER — ROSUVASTATIN CALCIUM 10 MG PO TABS
10.0000 mg | ORAL_TABLET | Freq: Every day | ORAL | Status: DC
  Administered 2014-09-29 – 2014-09-30 (×3): 10 mg via ORAL
  Filled 2014-09-28 (×3): qty 1

## 2014-09-28 MED ORDER — ESTRADIOL 0.1 MG/24HR TD PTWK: 0.1000 mg | MEDICATED_PATCH | TRANSDERMAL | Status: DC

## 2014-09-28 MED ORDER — SODIUM CHLORIDE 0.9 % IV SOLN
INTRAVENOUS | Status: AC
  Administered 2014-09-29: 01:00:00 via INTRAVENOUS

## 2014-09-28 NOTE — ED Notes (Signed)
Per pt sts chest pain and intermittent fluttering in chest. sts some nausea and SOB.

## 2014-09-28 NOTE — ED Provider Notes (Signed)
CSN: [id number]     Arrival date & time 09/28/14  1709 History   First MD Initiated Contact with Patient 09/28/14 1900     Chief Complaint  Patient presents with  . Chest Pain  . Numbness     (Consider location/radiation/quality/duration/timing/severity/associated sxs/prior Treatment) Patient is a 73 y.o. female presenting with chest pain. The history is provided by the patient.  Chest Pain Pain location:  Substernal area Pain quality: sharp   Pain quality comment:  Fullness Pain radiates to:  Does not radiate Pain radiates to the back: no   Pain severity:  Moderate Onset quality:  Sudden Duration: minutes to hours. Timing:  Intermittent Progression:  Unchanged Chronicity:  New Context: at rest   Context comment:  Some times while up doing things Relieved by:  Aspirin and nitroglycerin Worsened by:  Nothing tried Ineffective treatments:  None tried Associated symptoms: no abdominal pain, no back pain, no cough, no dizziness, no fatigue, no fever, no headache, no nausea, no shortness of breath and not vomiting     Past Medical History  Diagnosis Date  . Hyperlipidemia   . Diverticulosis of colon 06/2005  . Hypothyroid   . Insomnia   . Menopausal symptoms   . Hypertension   . Hyperglycemia   . adenomatous Colon polyps     colonoscopy 8/99, 12/02, 8/06  . Pericarditis 1980's  . Chest pain     uses NTG as needed  . Depression   . Bell's palsy   . GERD (gastroesophageal reflux disease)   . H/O hiatal hernia   . Headache(784.0)   . Arthritis   . Irritable bowel    Past Surgical History  Procedure Laterality Date  . Partial hysterectomy      ovaries intact  . Cholecystectomy  1990  . Ectopic pregnancy surgery  1971  . Cystectomy  05/04/08    left lower arm, benign  . Tonsillectomy    . Appendectomy    . Cardiac catheterization    . Fracture surgery      nose  . Tubal ligation    . Orif ankle fracture Left 10/23/2013    Procedure: OPEN REDUCTION INTERNAL  FIXATION (ORIF) LEFT ANKLE FRACTURE;  Surgeon: Marianna Payment, MD;  Location: [facility];  Service: Orthopedics;  Laterality: Left;   Family History  Problem Relation Age of Onset  . Cancer Father     stomach   . Heart disease Son     MI  . Arthritis Mother   . Diabetes Mother   . Colon cancer Neg Hx   . Pancreatic cancer Neg Hx   . Stomach cancer Neg Hx   . Breast cancer Neg Hx    History  Substance Use Topics  . Smoking status: Former Research scientist (life sciences)  . Smokeless tobacco: Former Systems developer  . Alcohol Use: No   OB History    No data available     Review of Systems  Constitutional: Negative for fever and fatigue.  HENT: Negative for congestion and drooling.   Eyes: Negative for pain.  Respiratory: Negative for cough and shortness of breath.   Cardiovascular: Positive for chest pain.  Gastrointestinal: Negative for nausea, vomiting, abdominal pain and diarrhea.  Genitourinary: Negative for dysuria and hematuria.  Musculoskeletal: Negative for back pain, gait problem and neck pain.  Skin: Negative for color change.  Neurological: Negative for dizziness and headaches.       Tingling in LUE/LLE.   Hematological: Negative for adenopathy.  Psychiatric/Behavioral: Negative for behavioral  problems.  All other systems reviewed and are negative.     Allergies  Niacin and Red dye  Home Medications   Prior to Admission medications   Medication Sig Start Date End Date Taking? Authorizing Provider  amitriptyline (ELAVIL) 50 MG tablet Take 1-2 tablets (50-100 mg total) by mouth at bedtime as needed for sleep. 07/04/14  Yes [name], MD  aspirin EC 81 MG tablet Take 81 mg by mouth daily.   Yes Historical Provider, MD  Aspirin-Salicylamide-Caffeine (BC HEADACHE PO) Take 1 packet by mouth daily as needed (headache).    Yes Historical Provider, MD  Carboxymethylcellul-Glycerin (OPTIVE) 0.5-0.9 % SOLN Apply 1 drop to eye 2 (two) times daily as needed (for dry eyes).   Yes Historical Provider,  MD  estradiol (CLIMARA - DOSED IN MG/24 HR) 0.1 mg/24hr patch Place 1 patch (0.1 mg total) onto the skin once a week. 07/04/14  Yes [name], MD  fluocinonide cream (LIDEX) 7.32 % Apply 1 application topically as needed (for rash).   Yes Historical Provider, MD  furosemide (LASIX) 20 MG tablet Take 1 tablet (20 mg total) by mouth daily. 07/04/14  Yes [name], MD  HYDROcodone-acetaminophen (NORCO/VICODIN) 5-325 MG per tablet Take 2 tablets by mouth every 4 (four) hours as needed for moderate pain. 07/04/14  Yes [name], MD  Hypromellose (GENTEAL OP) Apply 1 application to eye at bedtime.   Yes Historical Provider, MD  ketotifen (ZADITOR) 0.025 % ophthalmic solution Place 1 drop into both eyes daily.   Yes Historical Provider, MD  levothyroxine (SYNTHROID, LEVOTHROID) 100 MCG tablet Take one tablet by mouth daily except on Monday and Wednesday take 1/2 tablet 07/04/14  Yes [name], MD  levothyroxine (SYNTHROID, LEVOTHROID) 100 MCG tablet Take 50-100 mcg by mouth daily before breakfast. Takes 38mcg on Mon and Wed Takes 120mcg all other days   Yes Historical Provider, MD  losartan (COZAAR) 100 MG tablet Take 1 tablet (100 mg total) by mouth daily. To replace lisinopril rx 07/04/14  Yes [name], MD  Multiple Vitamins-Minerals (PRESERVISION AREDS 2) CAPS Take 2 capsules by mouth daily.   Yes Historical Provider, MD  nitroGLYCERIN (NITROSTAT) 0.4 MG SL tablet Place 0.4 mg under the tongue every 5 (five) minutes as needed for chest pain.    Yes Historical Provider, MD  potassium chloride (K-DUR) 10 MEQ tablet Take 1 tablet (10 mEq total) by mouth daily. 07/04/14  Yes [name], MD  rosuvastatin (CRESTOR) 10 MG tablet Take 10 mg by mouth at bedtime.   Yes Historical Provider, MD  acetaminophen (TYLENOL) 500 MG tablet Take 500 mg by mouth every 6 (six) hours as needed for mild pain.     Historical Provider, MD  CRESTOR 10 MG tablet TAKE 1 TABLET BY MOUTH EVERY DAY 07/17/14    [name], MD  fluocinonide cream (LIDEX) 0.05 % APPLY TOPICALLY DAILY AS NEEDED 08/13/14   [name], MD  OVER THE COUNTER MEDICATION Take 2 tablets by mouth daily. Eye Vitamin    Historical Provider, MD  promethazine (PHENERGAN) 25 MG tablet Take 1 tablet (25 mg total) by mouth every 6 (six) hours as needed for nausea. 10/23/13   Naiping Eduard Roux, MD  rosuvastatin (CRESTOR) 10 MG tablet Take 1 tablet (10 mg total) by mouth daily. 07/04/14   [name], MD  triamcinolone cream (KENALOG) 0.1 % Apply 1 application topically 2 (two) times daily. 03/14/14   [name],  MD   BP 150/76 mmHg  Pulse 92  Temp(Src) 98.3 F (36.8 C) (Oral)  Resp 20  SpO2 98% Physical Exam  Constitutional: She is oriented to person, place, and time. She appears well-developed and well-nourished.  HENT:  Head: Normocephalic and atraumatic.  Mouth/Throat: Oropharynx is clear and moist. No oropharyngeal exudate.  Eyes: Conjunctivae and EOM are normal. Pupils are equal, round, and reactive to light.  Neck: Normal range of motion. Neck supple.  Cardiovascular: Normal rate, regular rhythm, normal heart sounds and intact distal pulses.  Exam reveals no gallop and no friction rub.   No murmur heard. Pulmonary/Chest: Effort normal and breath sounds normal. No respiratory distress. She has no wheezes.  Abdominal: Soft. Bowel sounds are normal. There is no tenderness. There is no rebound and no guarding.  Musculoskeletal: Normal range of motion. She exhibits no edema or tenderness.  Neurological: She is alert and oriented to person, place, and time. She has normal strength. No sensory deficit.  Skin: Skin is warm and dry.  Psychiatric: She has a normal mood and affect. Her behavior is normal.  Nursing note and vitals reviewed.   ED Course  Procedures (including critical care time) Labs Review Labs Reviewed  BASIC METABOLIC PANEL - Abnormal; Notable for the following:    Potassium 3.6 (*)    GFR  calc non Af Amer 86 (*)    All other components within normal limits  CBC  PRO B NATRIURETIC PEPTIDE  I-STAT TROPOININ, ED    Imaging Review Dg Chest 2 View  09/28/2014   CLINICAL DATA:  Two week history of cardiac arrhythmia and difficulty breathing  EXAM: CHEST  2 VIEW  COMPARISON:  October 17, 2013  FINDINGS: There is slight scarring in the left base region. There is no edema or consolidation. The heart size and pulmonary vascularity are normal. No adenopathy. There is mild degenerative change in the thoracic spine.  IMPRESSION: No edema or consolidation.   Electronically Signed   By: Lowella Grip M.D.   On: 09/28/2014 18:25   Ct Head Wo Contrast  09/28/2014   CLINICAL DATA:  Tingling and numbness in the left arm and leg. Paresthesias.  EXAM: CT HEAD WITHOUT CONTRAST  TECHNIQUE: Contiguous axial images were obtained from the base of the skull through the vertex without contrast.  COMPARISON:  None  FINDINGS: Normal appearance of the intracranial structures. No evidence for acute hemorrhage, mass lesion, midline shift, hydrocephalus or large infarct. No acute bony abnormality. The visualized sinuses are clear.  IMPRESSION: No acute intracranial abnormality.   Electronically Signed   By: Markus Daft M.D.   On: 09/28/2014 20:30   Mri Brain Without Contrast  09/29/2014   CLINICAL DATA:  Paresthesia. Numbness and tingling in the left arm and leg.  EXAM: MRI HEAD WITHOUT CONTRAST  MRA HEAD WITHOUT CONTRAST  TECHNIQUE: Multiplanar, multiecho pulse sequences of the brain and surrounding structures were obtained without intravenous contrast. Angiographic images of the head were obtained using MRA technique without contrast.  COMPARISON:  Head CT 09/28/2014  FINDINGS: MRI HEAD FINDINGS  There is a 6 mm T2 hyperintense focus in the left parotid gland, indeterminate although may represent a small lymph node. There is no evidence of acute infarct, intracranial hemorrhage, mass, midline shift, or  extra-axial fluid collection. Ventricles and sulci are normal for age. Patchy T2 hyperintensities in the subcortical and deep cerebral white matter and pons are nonspecific but compatible with mild chronic small vessel ischemic disease.  Orbits  are unremarkable. Small right maxillary sinus mucous retention cyst is noted. There is mild mucosal thickening in the left mastoid air cells inferiorly. Major intracranial vascular flow voids are preserved.  MRA HEAD FINDINGS  Visualized distal vertebral arteries are patent with the left being dominant. PICAs appear patent. AICA and SCA origins appear patent. Basilar artery is somewhat tortuous without stenosis. Posterior communicating arteries are not identified. Proximal PCAs are patent without stenosis. There is mild bilateral PCA branch vessel irregularity.  Internal carotid arteries are patent from skullbase to carotid termini without stenosis. Right ACA is mildly dominant compared to the left. ACAs and MCAs are otherwise unremarkable. No intracranial aneurysm is identified.  IMPRESSION: 1. No acute intracranial abnormality. 2. Mild chronic small vessel ischemic disease. 3. No evidence of major intracranial arterial occlusion or significant proximal stenosis.   Electronically Signed   By: Logan Bores   On: 09/29/2014 12:22   Nm Myocar Multi W/spect W/wall Motion / Ef  09/29/2014   CLINICAL DATA:  Chest pain  EXAM: MYOCARDIAL IMAGING WITH SPECT (REST AND EXERCISE)  GATED LEFT VENTRICULAR WALL MOTION STUDY  LEFT VENTRICULAR EJECTION FRACTION  TECHNIQUE: Standard myocardial SPECT imaging was performed after resting intravenous injection of 10 mCi Tc-50m sestamibi. Subsequently, exercise tolerance test was performed by the patient under the supervision of the Cardiology staff. At peak-stress, 30 mCi Tc-81m sestamibi was injected intravenously and standard myocardial SPECT imaging was performed. Quantitative gated imaging was also performed to evaluate left ventricular  wall motion, and estimate left ventricular ejection fraction.  COMPARISON:  None.  FINDINGS: Perfusion: No decreased activity in the left ventricle on stress imaging to suggest reversible ischemia or infarction.  Wall Motion: Normal left ventricular wall motion. No left ventricular dilation.  Left Ventricular Ejection Fraction: 62 %  End diastolic volume 64 ml  End systolic volume 24 ml  IMPRESSION: 1. No reversible ischemia or infarction.  2. Normal left ventricular wall motion.  3. Left ventricular ejection fraction 62%  4. Low-risk stress test findings*.  *2012 Appropriate Use Criteria for Coronary Revascularization Focused Update: J Am Coll Cardiol. 0962;83(6):629-476. http://content.airportbarriers.com.aspx?articleid=1201161   Electronically Signed   By: Daryll Brod M.D.   On: 09/29/2014 15:17   Mr Jodene Nam Head/brain Wo Cm  09/29/2014   CLINICAL DATA:  Paresthesia. Numbness and tingling in the left arm and leg.  EXAM: MRI HEAD WITHOUT CONTRAST  MRA HEAD WITHOUT CONTRAST  TECHNIQUE: Multiplanar, multiecho pulse sequences of the brain and surrounding structures were obtained without intravenous contrast. Angiographic images of the head were obtained using MRA technique without contrast.  COMPARISON:  Head CT 09/28/2014  FINDINGS: MRI HEAD FINDINGS  There is a 6 mm T2 hyperintense focus in the left parotid gland, indeterminate although may represent a small lymph node. There is no evidence of acute infarct, intracranial hemorrhage, mass, midline shift, or extra-axial fluid collection. Ventricles and sulci are normal for age. Patchy T2 hyperintensities in the subcortical and deep cerebral white matter and pons are nonspecific but compatible with mild chronic small vessel ischemic disease.  Orbits are unremarkable. Small right maxillary sinus mucous retention cyst is noted. There is mild mucosal thickening in the left mastoid air cells inferiorly. Major intracranial vascular flow voids are preserved.  MRA HEAD  FINDINGS  Visualized distal vertebral arteries are patent with the left being dominant. PICAs appear patent. AICA and SCA origins appear patent. Basilar artery is somewhat tortuous without stenosis. Posterior communicating arteries are not identified. Proximal PCAs are patent without stenosis. There  is mild bilateral PCA branch vessel irregularity.  Internal carotid arteries are patent from skullbase to carotid termini without stenosis. Right ACA is mildly dominant compared to the left. ACAs and MCAs are otherwise unremarkable. No intracranial aneurysm is identified.  IMPRESSION: 1. No acute intracranial abnormality. 2. Mild chronic small vessel ischemic disease. 3. No evidence of major intracranial arterial occlusion or significant proximal stenosis.   Electronically Signed   By: Logan Bores   On: 09/29/2014 12:22     EKG Interpretation   Date/Time:  Sunday September 28 2014 17:28:53 EST Ventricular Rate:  95 PR Interval:  170 QRS Duration: 82 QT Interval:  376 QTC Calculation: 472 R Axis:   33 Text Interpretation:  Normal sinus rhythm Nonspecific T wave abnormality  Prolonged QT no previous for comparison Confirmed by Areta Terwilliger  MD, Ilya Neely  ([id number]) on 09/28/2014 7:05:32 PM      MDM   Final diagnoses:  Paresthesia  Other chest pain    7:24 PM 73 y.o. female w hx of HLP, HTN, who presents with intermittent chest pain and left arm and left leg paresthesias for the last week.currently asymptomatic. Afebrile and vital signs unremarkable here. We'll get screening labs and imaging.  Hospitalist to admit.     Pamella Pert, MD 09/29/14 [phone]

## 2014-09-28 NOTE — ED Notes (Signed)
Pt reports off and on central to left chest pain for two weeks, radiating to left arm and left leg causing some tingling and numbness. States she called her MD and made an appointment for Tuesday.

## 2014-09-28 NOTE — H&P (Signed)
PCP:  [name], MD  Cardiology [place] at [facility]   Chief Complaint:  Chest pain HPI: Kelly Kennedy is a 73 y.o. female   has a past medical history of Hyperlipidemia; Diverticulosis of colon (06/2005); Hypothyroid; Insomnia; Menopausal symptoms; Hypertension; Hyperglycemia; adenomatous Colon polyps; Pericarditis (1980's); Chest pain; Depression; Bell's palsy; GERD (gastroesophageal reflux disease); H/O hiatal hernia; Headache(784.0); Arthritis; Irritable bowel; and Diabetes mellitus without complication.   Presented with  1 week ago she had an episode of numbness of left leg and arm lasting about 5 min. Since then she had some mild tingling in that area. She was able to walk, denies any slurred speech. Family states she has not had much appetite since then and was fatigued. This was preceded by some diarrhea that has resolved by now. Few days later she started to have some intermittent chest pains and palpitations. She has felt fullness in her chest that is coming and going.   Patient has been seeing cardiology for remote hx of endocarditis.  Hospitalist was called for admission for atypical chest pain and possible TIA  Review of Systems:    Pertinent positives include:  Fatigue, chest pain, headaches, tingling, dyspnea on exertion, Bilateral lower extremity swelling   Constitutional:  No weight loss, night sweats, Fevers, chills, , weight loss  HEENT:  No  Difficulty swallowing,Tooth/dental problems,Sore throat,  No sneezing, itching, ear ache, nasal congestion, post nasal drip,  Cardio-vascular:  No  Orthopnea, PND, anasarca, dizziness, palpitations.no  GI:  No heartburn, indigestion, abdominal pain, nausea, vomiting, diarrhea, change in bowel habits, loss of appetite, melena, blood in stool, hematemesis Resp:  no shortness of breath at rest. No No excess mucus, no productive cough, No non-productive cough, No coughing up of blood.No change in color of mucus.No  wheezing. Skin:  no rash or lesions. No jaundice GU:  no dysuria, change in color of urine, no urgency or frequency. No straining to urinate.  No flank pain.  Musculoskeletal:  No joint pain or no joint swelling. No decreased range of motion. No back pain.  Psych:  No change in mood or affect. No depression or anxiety. No memory loss.  Neuro: no localizing neurological complaints, no  no weakness, no double vision, no gait abnormality, no slurred speech, no confusion  Otherwise ROS are negative except for above, 10 systems were reviewed  Past Medical History: Past Medical History  Diagnosis Date  . Hyperlipidemia   . Diverticulosis of colon 06/2005  . Hypothyroid   . Insomnia   . Menopausal symptoms   . Hypertension   . Hyperglycemia   . adenomatous Colon polyps     colonoscopy 8/99, 12/02, 8/06  . Pericarditis 1980's  . Chest pain     uses NTG as needed  . Depression   . Bell's palsy   . GERD (gastroesophageal reflux disease)   . H/O hiatal hernia   . Headache(784.0)   . Arthritis   . Irritable bowel   . Diabetes mellitus without complication    Past Surgical History  Procedure Laterality Date  . Partial hysterectomy      ovaries intact  . Cholecystectomy  1990  . Ectopic pregnancy surgery  1971  . Cystectomy  05/04/08    left lower arm, benign  . Tonsillectomy    . Appendectomy    . Cardiac catheterization    . Fracture surgery      nose  . Tubal ligation    . Orif ankle fracture Left 10/23/2013  Procedure: OPEN REDUCTION INTERNAL FIXATION (ORIF) LEFT ANKLE FRACTURE;  Surgeon: Marianna Payment, MD;  Location: [place];  Service: Orthopedics;  Laterality: Left;     Medications: Prior to Admission medications   Medication Sig Start Date End Date Taking? Authorizing Provider  amitriptyline (ELAVIL) 50 MG tablet Take 50-100 mg by mouth at bedtime.   Yes Historical Provider, MD  aspirin EC 81 MG tablet Take 81 mg by mouth daily.   Yes Historical Provider, MD   Aspirin-Salicylamide-Caffeine (BC HEADACHE PO) Take 1 packet by mouth daily as needed (headache).    Yes Historical Provider, MD  Carboxymethylcellul-Glycerin (OPTIVE) 0.5-0.9 % SOLN Apply 1 drop to eye 2 (two) times daily as needed (for dry eyes).   Yes Historical Provider, MD  estradiol (CLIMARA - DOSED IN MG/24 HR) 0.1 mg/24hr patch Place 1 patch (0.1 mg total) onto the skin once a week. 07/04/14  Yes [name], MD  fluocinonide cream (LIDEX) 5.62 % Apply 1 application topically as needed (for rash).   Yes Historical Provider, MD  furosemide (LASIX) 20 MG tablet Take 1 tablet (20 mg total) by mouth daily. 07/04/14  Yes [name], MD  HYDROcodone-acetaminophen (NORCO/VICODIN) 5-325 MG per tablet Take 2 tablets by mouth every 4 (four) hours as needed for moderate pain. 07/04/14  Yes [name], MD  Hypromellose (GENTEAL OP) Apply 1 application to eye at bedtime.   Yes Historical Provider, MD  ketotifen (ZADITOR) 0.025 % ophthalmic solution Place 1 drop into both eyes daily.   Yes Historical Provider, MD  levothyroxine (SYNTHROID, LEVOTHROID) 100 MCG tablet Take one tablet by mouth daily except on Monday and Wednesday take 1/2 tablet 07/04/14  Yes [name], MD  levothyroxine (SYNTHROID, LEVOTHROID) 100 MCG tablet Take 50-100 mcg by mouth daily before breakfast. Takes 45mcg on Mon and Wed Takes 13mcg all other days   Yes Historical Provider, MD  losartan (COZAAR) 100 MG tablet Take 1 tablet (100 mg total) by mouth daily. To replace lisinopril rx 07/04/14  Yes [name], MD  Multiple Vitamins-Minerals (PRESERVISION AREDS 2) CAPS Take 2 capsules by mouth daily.   Yes Historical Provider, MD  nitroGLYCERIN (NITROSTAT) 0.4 MG SL tablet Place 0.4 mg under the tongue every 5 (five) minutes as needed for chest pain.    Yes Historical Provider, MD  potassium chloride (K-DUR) 10 MEQ tablet Take 1 tablet (10 mEq total) by mouth daily. 07/04/14  Yes [name], MD  rosuvastatin (CRESTOR)  10 MG tablet Take 10 mg by mouth at bedtime.   Yes Historical Provider, MD  acetaminophen (TYLENOL) 500 MG tablet Take 500 mg by mouth every 6 (six) hours as needed for mild pain.     Historical Provider, MD  amitriptyline (ELAVIL) 50 MG tablet Take 1-2 tablets (50-100 mg total) by mouth at bedtime as needed for sleep. 07/04/14   [name], MD  CRESTOR 10 MG tablet TAKE 1 TABLET BY MOUTH EVERY DAY 07/17/14   [name], MD  fluocinonide cream (LIDEX) 0.05 % APPLY TOPICALLY DAILY AS NEEDED 08/13/14   [name], MD  OVER THE COUNTER MEDICATION Take 2 tablets by mouth daily. Eye Vitamin    Historical Provider, MD  promethazine (PHENERGAN) 25 MG tablet Take 1 tablet (25 mg total) by mouth every 6 (six) hours as needed for nausea. 10/23/13   Naiping Eduard Roux, MD  rosuvastatin (CRESTOR) 10 MG tablet Take 1 tablet (10 mg total) by mouth daily. 07/04/14  [name], MD  triamcinolone cream (KENALOG) 0.1 % Apply 1 application topically 2 (two) times daily. 03/14/14   [name], MD    Allergies:   Allergies  Allergen Reactions  . Niacin Rash  . Red Dye Rash    Social History:  Ambulatory   Cane,  Lives at home With family   reports that she has quit smoking. She has quit using smokeless tobacco. She reports that she does not drink alcohol or use illicit drugs.    Family History: family history includes Arthritis in her mother; Cancer in her father; Diabetes in her mother; Heart disease in her son. There is no history of Colon cancer, Pancreatic cancer, Stomach cancer, or Breast cancer.    Physical Exam: Patient Vitals for the past 24 hrs:  BP Temp Temp src Pulse Resp SpO2 Height Weight  09/28/14 2244 (!) 173/74 mmHg 98 F (36.7 C) Oral 95 18 100 % 5\' 3"  (1.6 m) 83.371 kg (183 lb 12.8 oz)  09/28/14 2218 - - - 90 - 99 % - -  09/28/14 2216 141/72 mmHg - - - - - - -  09/28/14 2000 143/74 mmHg - - 88 - 100 % - -  09/28/14 1900 150/76 mmHg - - 92 - 98 % - -  09/28/14  1850 - - - 92 - 99 % - -  09/28/14 1848 158/90 mmHg - - - - - - -  09/28/14 1734 - 98.3 F (36.8 C) Oral - - - - -  09/28/14 1731 171/98 mmHg - - 98 20 97 % - -    1. General:  in No Acute distress 2. Psychological: Alert and  Oriented 3. Head/ENT:   Dry Mucous Membranes                          Head Non traumatic, neck supple                          Normal  Dentition 4. SKIN:   decreased Skin turgor,  Skin clean Dry and intact no rash 5. Heart: Regular rate and rhythm, holosystolic Murmur, no Rub or gallop 6. Lungs: Clear to auscultation bilaterally, no wheezes or crackles   7. Abdomen: Soft, non-tender, Non distended, obese 8. Lower extremities: no clubbing, cyanosis, or edema 9. Neurologically Grossly intact, moving all 4 extremities equally 10. MSK: Normal range of motion  body mass index is 32.57 kg/(m^2).   Labs on Admission:   Results for orders placed or performed during the hospital encounter of 09/28/14 (from the past 24 hour(s))  CBC     Status: None   Collection Time: 09/28/14  5:51 PM  Result Value Ref Range   WBC 6.5 4.0 - 10.5 K/uL   RBC 4.26 3.87 - 5.11 MIL/uL   Hemoglobin 12.2 12.0 - 15.0 g/dL   HCT 36.5 36.0 - 46.0 %   MCV 85.7 78.0 - 100.0 fL   MCH 28.6 26.0 - 34.0 pg   MCHC 33.4 30.0 - 36.0 g/dL   RDW 14.8 11.5 - 15.5 %   Platelets 333 150 - 400 K/uL  Basic metabolic panel     Status: Abnormal   Collection Time: 09/28/14  5:51 PM  Result Value Ref Range   Sodium 142 137 - 147 mEq/L   Potassium 3.6 (L) 3.7 - 5.3 mEq/L   Chloride 102 96 - 112 mEq/L   CO2 28  19 - 32 mEq/L   Glucose, Bld 81 70 - 99 mg/dL   BUN 8 6 - 23 mg/dL   Creatinine, Ser 0.67 0.50 - 1.10 mg/dL   Calcium 9.3 8.4 - 10.5 mg/dL   GFR calc non Af Amer 86 (L) >90 mL/min   GFR calc Af Amer >90 >90 mL/min   Anion gap 12 5 - 15  BNP (order ONLY if patient complains of dyspnea/SOB AND you have documented it for THIS visit)     Status: None   Collection Time: 09/28/14  5:51 PM  Result  Value Ref Range   Pro B Natriuretic peptide (BNP) 36.7 0 - 125 pg/mL  I-stat troponin, ED (not at [facility])     Status: None   Collection Time: 09/28/14  7:15 PM  Result Value Ref Range   Troponin i, poc 0.01 0.00 - 0.08 ng/mL   Comment 3            Lab Results  Component Value Date   HGBA1C 6.9* 07/04/2014    Estimated Creatinine Clearance: 65 mL/min (by C-G formula based on Cr of 0.67).  BNP (last 3 results)  Recent Labs  09/28/14 1751  PROBNP 36.7    Other results:  I have pearsonaly reviewed this: ECG REPORT  Rate: 95  Rhythm: NSR ST&T Change: no ischemic changes   Filed Weights   09/28/14 2244  Weight: 83.371 kg (183 lb 12.8 oz)    Cultures: No results found for: SDES, SPECREQUEST, CULT, REPTSTATUS   Radiological Exams on Admission: Dg Chest 2 View  09/28/2014   CLINICAL DATA:  Two week history of cardiac arrhythmia and difficulty breathing  EXAM: CHEST  2 VIEW  COMPARISON:  October 17, 2013  FINDINGS: There is slight scarring in the left base region. There is no edema or consolidation. The heart size and pulmonary vascularity are normal. No adenopathy. There is mild degenerative change in the thoracic spine.  IMPRESSION: No edema or consolidation.   Electronically Signed   By: Lowella Grip M.D.   On: 09/28/2014 18:25   Ct Head Wo Contrast  09/28/2014   CLINICAL DATA:  Tingling and numbness in the left arm and leg. Paresthesias.  EXAM: CT HEAD WITHOUT CONTRAST  TECHNIQUE: Contiguous axial images were obtained from the base of the skull through the vertex without contrast.  COMPARISON:  None  FINDINGS: Normal appearance of the intracranial structures. No evidence for acute hemorrhage, mass lesion, midline shift, hydrocephalus or large infarct. No acute bony abnormality. The visualized sinuses are clear.  IMPRESSION: No acute intracranial abnormality.   Electronically Signed   By: Markus Daft M.D.   On: 09/28/2014 20:30    Chart has been  reviewed  Assessment/Plan  73 yo F with hx of HTN, HL here with atypical chest dyscomfort and intermittent paresthesia for 1 week with negative CT head.   Present on Admission:  . Chest pain - atypical - given risk factors will admit, monitor on telemetry, cycle cardiac enzymes, obtain serial ECG. Further risk stratify with lipid panel, hgA1C, obtain TSH. Make sure patient is on Aspirin. Further treatment based on the currently pending results.  . Essential hypertension - continue cozaar . TIA (transient ischemic attack) -  - will admit based on TIA/CVA protocol, await results of MRA/MRI, Carotid Doppler and Echo, obtain cardiac enzymes,  ECG,   Lipid panel, TSH. Order PT/OT evaluation. Will make sure patient is on antiplatelet agent.   Neurology consult in AM if MRI is  concerning.     DM 2 -  SSI, diet controlled, will check hg A1C Hypokalemia - will replace Recent diarrhea with evidence of fl;uid depletion - will check orthostatics give gentle IVF.   Prophylaxis: SCD, Protonix  CODE STATUS:  FULL CODE  Other plan as per orders.  I have spent a total of 55 min on this admission  Skye Plamondon 09/28/2014, 10:56 PM  [facility]  Pager [phone]   after 2 AM please page floor coverage PA If 7AM-7PM, please contact the day team taking care of the patient  [email or web address]  Password [email or web address]

## 2014-09-29 ENCOUNTER — Observation Stay (HOSPITAL_COMMUNITY): Payer: Commercial Managed Care - HMO

## 2014-09-29 ENCOUNTER — Encounter (HOSPITAL_COMMUNITY): Payer: Commercial Managed Care - HMO

## 2014-09-29 DIAGNOSIS — G451 Carotid artery syndrome (hemispheric): Secondary | ICD-10-CM

## 2014-09-29 DIAGNOSIS — R072 Precordial pain: Secondary | ICD-10-CM

## 2014-09-29 DIAGNOSIS — R202 Paresthesia of skin: Secondary | ICD-10-CM | POA: Diagnosis not present

## 2014-09-29 DIAGNOSIS — R0789 Other chest pain: Secondary | ICD-10-CM | POA: Diagnosis not present

## 2014-09-29 DIAGNOSIS — R079 Chest pain, unspecified: Secondary | ICD-10-CM

## 2014-09-29 DIAGNOSIS — Z8241 Family history of sudden cardiac death: Secondary | ICD-10-CM

## 2014-09-29 DIAGNOSIS — G459 Transient cerebral ischemic attack, unspecified: Secondary | ICD-10-CM

## 2014-09-29 DIAGNOSIS — K5730 Diverticulosis of large intestine without perforation or abscess without bleeding: Secondary | ICD-10-CM | POA: Diagnosis not present

## 2014-09-29 DIAGNOSIS — I4581 Long QT syndrome: Secondary | ICD-10-CM

## 2014-09-29 DIAGNOSIS — E785 Hyperlipidemia, unspecified: Secondary | ICD-10-CM | POA: Diagnosis not present

## 2014-09-29 DIAGNOSIS — E780 Pure hypercholesterolemia: Secondary | ICD-10-CM

## 2014-09-29 LAB — URINALYSIS, ROUTINE W REFLEX MICROSCOPIC
BILIRUBIN URINE: NEGATIVE
Glucose, UA: NEGATIVE mg/dL
Hgb urine dipstick: NEGATIVE
Ketones, ur: NEGATIVE mg/dL
Leukocytes, UA: NEGATIVE
NITRITE: NEGATIVE
PH: 6.5 (ref 5.0–8.0)
Protein, ur: NEGATIVE mg/dL
SPECIFIC GRAVITY, URINE: 1.009 (ref 1.005–1.030)
UROBILINOGEN UA: 0.2 mg/dL (ref 0.0–1.0)

## 2014-09-29 LAB — HEMOGLOBIN A1C
HEMOGLOBIN A1C: 6.8 % — AB (ref <5.7)
MEAN PLASMA GLUCOSE: 148 mg/dL — AB (ref <117)

## 2014-09-29 LAB — TROPONIN I
Troponin I: <0.3 ng/mL (ref <0.30)
Troponin I: <0.3 ng/mL (ref <0.30)

## 2014-09-29 LAB — D-DIMER, QUANTITATIVE: D-Dimer, Quant: 0.48 ug/mL-FEU (ref 0.00–0.48)

## 2014-09-29 LAB — GLUCOSE, CAPILLARY
GLUCOSE-CAPILLARY: 101 mg/dL — AB (ref 70–99)
GLUCOSE-CAPILLARY: 98 mg/dL (ref 70–99)
Glucose-Capillary: 118 mg/dL — ABNORMAL HIGH (ref 70–99)
Glucose-Capillary: 133 mg/dL — ABNORMAL HIGH (ref 70–99)
Glucose-Capillary: 138 mg/dL — ABNORMAL HIGH (ref 70–99)

## 2014-09-29 LAB — MAGNESIUM: Magnesium: 1.7 mg/dL (ref 1.5–2.5)

## 2014-09-29 LAB — LIPID PANEL
Cholesterol: 120 mg/dL (ref 0–200)
HDL: 56 mg/dL (ref >39)
LDL CALC: 42 mg/dL (ref 0–99)
Total CHOL/HDL Ratio: 2.1 RATIO
Triglycerides: 110 mg/dL (ref <150)
VLDL: 22 mg/dL (ref 0–40)

## 2014-09-29 MED ORDER — TECHNETIUM TC 99M SESTAMIBI GENERIC - CARDIOLITE
30.0000 | Freq: Once | INTRAVENOUS | Status: AC | PRN
  Administered 2014-09-29: 30 via INTRAVENOUS

## 2014-09-29 MED ORDER — MAGNESIUM OXIDE 400 (241.3 MG) MG PO TABS: 400.0000 mg | ORAL_TABLET | Freq: Every day | ORAL | Status: DC

## 2014-09-29 MED ORDER — GLIPIZIDE 5 MG PO TABS
2.5000 mg | ORAL_TABLET | Freq: Every day | ORAL | Status: DC
  Administered 2014-09-30: 2.5 mg via ORAL
  Filled 2014-09-29: qty 1

## 2014-09-29 MED ORDER — GLIPIZIDE 2.5 MG HALF TABLET: 2.5000 mg | ORAL_TABLET | Freq: Every day | ORAL | Status: DC

## 2014-09-29 MED ORDER — TECHNETIUM TC 99M SESTAMIBI GENERIC - CARDIOLITE
10.0000 | Freq: Once | INTRAVENOUS | Status: AC | PRN
  Administered 2014-09-29: 10 via INTRAVENOUS

## 2014-09-29 MED ORDER — MAGNESIUM OXIDE 400 (241.3 MG) MG PO TABS
400.0000 mg | ORAL_TABLET | Freq: Every day | ORAL | Status: DC
  Administered 2014-09-29 – 2014-09-30 (×2): 400 mg via ORAL
  Filled 2014-09-29 (×2): qty 1

## 2014-09-29 NOTE — Discharge Summary (Addendum)
Physician Discharge Summary  Kelly Kennedy MRN: [id number] DOB/AGE: 1941/08/08 73 y.o.  PCP: [name], MD   Admit date: 09/28/2014 Discharge date: 09/29/2014  Discharge Diagnoses:     Type 2 diabetes, diet controlled   Chest pain   Paresthesia   Essential hypertension   TIA (transient ischemic attack) Prolonged QT interval Family history of sudden cardiac death in child   Follow-up recommendations Follow-up with PCP in 5-7 days Avoid QT prolonging agents 30 day event monitor at discharge    Medication List    STOP taking these medications        BC HEADACHE PO      TAKE these medications        acetaminophen 500 MG tablet  Commonly known as:  TYLENOL  Take 500 mg by mouth every 6 (six) hours as needed for mild pain.     amitriptyline 50 MG tablet  Commonly known as:  ELAVIL  Take 1-2 tablets (50-100 mg total) by mouth at bedtime as needed for sleep.     aspirin EC 81 MG tablet  Take 81 mg by mouth daily.     CRESTOR 10 MG tablet  Generic drug:  rosuvastatin  TAKE 1 TABLET BY MOUTH EVERY DAY     estradiol 0.1 mg/24hr patch  Commonly known as:  CLIMARA - Dosed in mg/24 hr  Place 1 patch (0.1 mg total) onto the skin once a week.     fluocinonide cream 0.05 %  Commonly known as:  LIDEX  APPLY TOPICALLY DAILY AS NEEDED     furosemide 20 MG tablet  Commonly known as:  LASIX  Take 1 tablet (20 mg total) by mouth daily.     GENTEAL OP  Apply 1 application to eye at bedtime.     glipiZIDE 2.5 mg Tabs tablet  Commonly known as:  GLUCOTROL  Take 0.5 tablets (2.5 mg total) by mouth daily before breakfast.  Start taking on:  09/30/2014     HYDROcodone-acetaminophen 5-325 MG per tablet  Commonly known as:  NORCO/VICODIN  Take 2 tablets by mouth every 4 (four) hours as needed for moderate pain.     ketotifen 0.025 % ophthalmic solution  Commonly known as:  ZADITOR  Place 1 drop into both eyes daily.     levothyroxine 100 MCG tablet   Commonly known as:  SYNTHROID, LEVOTHROID  - Take 50-100 mcg by mouth daily before breakfast. Takes 9mg on Mon and Wed  - Takes 1065m all other days     levothyroxine 100 MCG tablet  Commonly known as:  SYNTHROID, LEVOTHROID  Take one tablet by mouth daily except on Monday and Wednesday take 1/2 tablet     losartan 100 MG tablet  Commonly known as:  COZAAR  Take 1 tablet (100 mg total) by mouth daily. To replace lisinopril rx     magnesium oxide 400 (241.3 MG) MG tablet  Commonly known as:  MAG-OX  Take 1 tablet (400 mg total) by mouth daily.     nitroGLYCERIN 0.4 MG SL tablet  Commonly known as:  NITROSTAT  Place 0.4 mg under the tongue every 5 (five) minutes as needed for chest pain.     OPTIVE 0.5-0.9 % Soln  Generic drug:  Carboxymethylcellul-Glycerin  Apply 1 drop to eye 2 (two) times daily as needed (for dry eyes).     OVER THE COUNTER MEDICATION  Take 2 tablets by mouth daily. Eye Vitamin     potassium chloride 10 MEQ  tablet  Commonly known as:  K-DUR  Take 1 tablet (10 mEq total) by mouth daily.     PRESERVISION AREDS 2 Caps  Take 2 capsules by mouth daily.     promethazine 25 MG tablet  Commonly known as:  PHENERGAN  Take 1 tablet (25 mg total) by mouth every 6 (six) hours as needed for nausea.     triamcinolone cream 0.1 %  Commonly known as:  KENALOG  Apply 1 application topically 2 (two) times daily.        Discharge Condition: stable  Disposition: 01-Home or Self Care   Consults:  Cardiology   Significant Diagnostic Studies: Dg Chest 2 View  09/28/2014   CLINICAL DATA:  Two week history of cardiac arrhythmia and difficulty breathing  EXAM: CHEST  2 VIEW  COMPARISON:  October 17, 2013  FINDINGS: There is slight scarring in the left base region. There is no edema or consolidation. The heart size and pulmonary vascularity are normal. No adenopathy. There is mild degenerative change in the thoracic spine.  IMPRESSION: No edema or consolidation.    Electronically Signed   By: Lowella Grip M.D.   On: 09/28/2014 18:25   Ct Head Wo Contrast  09/28/2014   CLINICAL DATA:  Tingling and numbness in the left arm and leg. Paresthesias.  EXAM: CT HEAD WITHOUT CONTRAST  TECHNIQUE: Contiguous axial images were obtained from the base of the skull through the vertex without contrast.  COMPARISON:  None  FINDINGS: Normal appearance of the intracranial structures. No evidence for acute hemorrhage, mass lesion, midline shift, hydrocephalus or large infarct. No acute bony abnormality. The visualized sinuses are clear.  IMPRESSION: No acute intracranial abnormality.   Electronically Signed   By: Markus Daft M.D.   On: 09/28/2014 20:30      Microbiology: No results found for this or any previous visit (from the past 240 hour(s)).   Labs: Results for orders placed or performed during the hospital encounter of 09/28/14 (from the past 48 hour(s))  CBC     Status: None   Collection Time: 09/28/14  5:51 PM  Result Value Ref Range   WBC 6.5 4.0 - 10.5 K/uL   RBC 4.26 3.87 - 5.11 MIL/uL   Hemoglobin 12.2 12.0 - 15.0 g/dL   HCT 36.5 36.0 - 46.0 %   MCV 85.7 78.0 - 100.0 fL   MCH 28.6 26.0 - 34.0 pg   MCHC 33.4 30.0 - 36.0 g/dL   RDW 14.8 11.5 - 15.5 %   Platelets 333 150 - 400 K/uL  Basic metabolic panel     Status: Abnormal   Collection Time: 09/28/14  5:51 PM  Result Value Ref Range   Sodium 142 137 - 147 mEq/L   Potassium 3.6 (L) 3.7 - 5.3 mEq/L   Chloride 102 96 - 112 mEq/L   CO2 28 19 - 32 mEq/L   Glucose, Bld 81 70 - 99 mg/dL   BUN 8 6 - 23 mg/dL   Creatinine, Ser 0.67 0.50 - 1.10 mg/dL   Calcium 9.3 8.4 - 10.5 mg/dL   GFR calc non Af Amer 86 (L) >90 mL/min   GFR calc Af Amer >90 >90 mL/min    Comment: (NOTE) The eGFR has been calculated using the CKD EPI equation. This calculation has not been validated in all clinical situations. eGFR's persistently <90 mL/min signify possible Chronic Kidney Disease.    Anion gap 12 5 - 15  BNP  (order ONLY if patient complains  of dyspnea/SOB AND you have documented it for THIS visit)     Status: None   Collection Time: 09/28/14  5:51 PM  Result Value Ref Range   Pro B Natriuretic peptide (BNP) 36.7 0 - 125 pg/mL  I-stat troponin, ED (not at [facility]     Status: None   Collection Time: 09/28/14  7:15 PM  Result Value Ref Range   Troponin i, poc 0.01 0.00 - 0.08 ng/mL   Comment 3            Comment: Due to the release kinetics of cTnI, a negative result within the first hours of the onset of symptoms does not rule out myocardial infarction with certainty. If myocardial infarction is still suspected, repeat the test at appropriate intervals.   Troponin I (q 6hr x 3)     Status: None   Collection Time: 09/29/14 12:15 AM  Result Value Ref Range   Troponin I <0.30 <0.30 ng/mL    Comment:        Due to the release kinetics of cTnI, a negative result within the first hours of the onset of symptoms does not rule out myocardial infarction with certainty. If myocardial infarction is still suspected, repeat the test at appropriate intervals.   Glucose, capillary     Status: Abnormal   Collection Time: 09/29/14 12:24 AM  Result Value Ref Range   Glucose-Capillary 138 (H) 70 - 99 mg/dL  Glucose, capillary     Status: Abnormal   Collection Time: 09/29/14  4:17 AM  Result Value Ref Range   Glucose-Capillary 101 (H) 70 - 99 mg/dL  Magnesium     Status: None   Collection Time: 09/29/14  5:38 AM  Result Value Ref Range   Magnesium 1.7 1.5 - 2.5 mg/dL  Troponin I (q 6hr x 3)     Status: None   Collection Time: 09/29/14  5:38 AM  Result Value Ref Range   Troponin I <0.30 <0.30 ng/mL    Comment:        Due to the release kinetics of cTnI, a negative result within the first hours of the onset of symptoms does not rule out myocardial infarction with certainty. If myocardial infarction is still suspected, repeat the test at appropriate intervals.   Lipid panel     Status: None    Collection Time: 09/29/14  5:38 AM  Result Value Ref Range   Cholesterol 120 0 - 200 mg/dL   Triglycerides 110 <150 mg/dL   HDL 56 >39 mg/dL   Total CHOL/HDL Ratio 2.1 RATIO   VLDL 22 0 - 40 mg/dL   LDL Cholesterol 42 0 - 99 mg/dL    Comment:        Total Cholesterol/HDL:CHD Risk Coronary Heart Disease Risk Table                     Men   Women  1/2 Average Risk   3.4   3.3  Average Risk       5.0   4.4  2 X Average Risk   9.6   7.1  3 X Average Risk  23.4   11.0        Use the calculated Patient Ratio above and the CHD Risk Table to determine the patient's CHD Risk.        ATP III CLASSIFICATION (LDL):  <100     mg/dL   Optimal  100-129  mg/dL   Near or Above  Optimal  130-159  mg/dL   Borderline  160-189  mg/dL   High  >190     mg/dL   Very High   Urinalysis, Routine w reflex microscopic     Status: None   Collection Time: 09/29/14  6:45 AM  Result Value Ref Range   Color, Urine YELLOW YELLOW   APPearance CLEAR CLEAR   Specific Gravity, Urine 1.009 1.005 - 1.030   pH 6.5 5.0 - 8.0   Glucose, UA NEGATIVE NEGATIVE mg/dL   Hgb urine dipstick NEGATIVE NEGATIVE   Bilirubin Urine NEGATIVE NEGATIVE   Ketones, ur NEGATIVE NEGATIVE mg/dL   Protein, ur NEGATIVE NEGATIVE mg/dL   Urobilinogen, UA 0.2 0.0 - 1.0 mg/dL   Nitrite NEGATIVE NEGATIVE   Leukocytes, UA NEGATIVE NEGATIVE    Comment: MICROSCOPIC NOT DONE ON URINES WITH NEGATIVE PROTEIN, BLOOD, LEUKOCYTES, NITRITE, OR GLUCOSE <1000 mg/dL.  Glucose, capillary     Status: None   Collection Time: 09/29/14  9:43 AM  Result Value Ref Range   Glucose-Capillary 98 70 - 99 mg/dL   Comment 1 Documented in Chart    Comment 2 Notify RN      HPI   73 y.o. female with a past medical history significant for possible endocarditis in the 1980s (may be mis-charting of pericarditis), HTN, hyperlipidemia, hyperglycemia (A1c 6.9% suggests full blown DM, diet controlled) presents with recent onset (1 week) episodic  chest pain, often (but not always) exertion related, precordial bloating radiating to right chest, lasting several seconds, maybe relieved by rest. At times associates palpitations (heart "turning over"). Simultaneously, has had headaches and an episode of transient left hemianesthesia/paresthesia. Mild exertional dyspnea (has to take a break before finishing cleaning living room).  Her son had sudden death at age 29 - autopsy -"massive heart attack". No other family history of premature cardiac or vascular illness.  Her ECG is abnormal and there are subtle dynamic changes since yesterday - more obvious ST depression and T wave inversion inferolaterally. Borderline long QT. 1 week ago she had an episode of numbness of left leg and arm lasting about 5 min. Since then she had some mild tingling in that area. She was able to walk, denies any slurred speech. Family states she has not had much appetite since then and was fatigued. This was preceded by some diarrhea that has resolved by now. Few days later she started to have some intermittent chest pains and palpitations   HOSPITAL COURSE:  . Chest pain - atypical - given risk factors patient was seen by cardiology, they recommended an inpatient treadmill [facility] telemetry showed ST-T changes in the inferolateral leads suggesting left ventricle hypertrophy as well as prolonged QT interval. Cardiology does suspect underlying atrial fibrillation which is brought probably paroxysmal. They  recommend a 30 day event monitor at discharge, cardiac enzymes were negative,  Lipid panel shows an LDL of 42, HDL of 56, hemoglobin A1c 6.9 on 8/15, patient on estradiol patch, negative  d-dimer . continue Aspirin. Anticipate possible discharge today if stress test is negative   . Essential hypertension - continue cozaar  . TIA (transient ischemic attack) - - evaluated forTIA/CVA protocol, await results of MRA/MRI, Carotid Doppler and Echo, obtain cardiac enzymes,  ECG,pending PT/OT evaluation.continue aspirin   DM 2 -started on glipizide 2.5 mg, diet controlled,previous hemoglobin A1c was 6.9  Hypokalemia - continue oral potassium, magnesium 1.7. Patient started on magnesium oxide 400 mg daily  Recent diarrhea resolved with evidence of fl;uid depletion - hydrated with  IV fluids    Discharge Exam:  Blood pressure 150/88, pulse 84, temperature 98.2 F (36.8 C), temperature source Oral, resp. rate 16, height _0  (1.6 m), weight 83.371 kg (183 lb 12.8 oz), SpO2 99 %.  General: Alert, oriented x3, no distress Head: no evidence of trauma, PERRL, EOMI, no exophtalmos or lid lag, no myxedema, no xanthelasma; normal ears, nose and oropharynx Neck: normal jugular venous pulsations and no hepatojugular reflux; brisk carotid pulses without delay and no carotid bruits Chest: clear to auscultation, no signs of consolidation by percussion or palpation, normal fremitus, symmetrical and full respiratory excursions Cardiovascular: normal position and quality of the apical impulse, regular rhythm, normal first and second heart sounds, no murmurs, rubs or gallops Abdomen: no tenderness or distention, no masses by palpation, no abnormal pulsatility or arterial bruits, normal bowel sounds, no hepatosplenomegaly Extremities: no clubbing, cyanosis or edema; 2+ radial, ulnar and brachial pulses bilaterally; 2+ right femoral, posterior tibial and dorsalis pedis pulses; 2+ left femoral, posterior tibial and dorsalis pedis pulses; no subclavian or femoral bruits Neurological: grossly nonfocal         Follow-up Information    Follow up with [name], MD. Schedule an appointment as soon as possible for a visit in 1 week.   Specialty:  Family Medicine   Contact information:   [place] [address] [phone]       Signed: Reyne Dumas 09/29/2014, 12:06 PM

## 2014-09-29 NOTE — Progress Notes (Signed)
UR completed 

## 2014-09-29 NOTE — Plan of Care (Signed)
Problem: Consults Goal: Chest Pain Patient Education (See Patient Education module for education specifics.) Outcome: Completed/Met Date Met:  09/29/14 Goal: Skin Care Protocol Initiated - if Braden Score 18 or less If consults are not indicated, leave blank or document [name] Outcome: Not Applicable Date Met:  81/84/03 Goal: Tobacco Cessation referral if indicated Outcome: Not Applicable Date Met:  75/43/60 Goal: Nutrition Consult-if indicated Outcome: Not Applicable Date Met:  67/70/34 Goal: Diabetes Guidelines if Diabetic/Glucose > 140 If diabetic or lab glucose is > 140 mg/dl - Initiate Diabetes/Hyperglycemia Guidelines & Document Interventions  Outcome: Completed/Met Date Met:  09/29/14

## 2014-09-29 NOTE — Progress Notes (Signed)
*  PRELIMINARY RESULTS* Vascular Ultrasound Carotid Duplex (Doppler) has been completed.  Preliminary findings: Right: 1-39% ICA stenosis. ECA stenosis. Vertebral artery flow is antegrade. Left: 40-59% ICA stenosis. Vertebral artery flow is antegrade.   Diamond Nickel 09/29/2014, 3:41 PM

## 2014-09-29 NOTE — Plan of Care (Signed)
Problem: Phase I Progression Outcomes Goal: Hemodynamically stable Outcome: Completed/Met Date Met:  09/29/14 Goal: Anginal pain relieved Outcome: Completed/Met Date Met:  09/29/14 Goal: Aspirin unless contraindicated Outcome: Completed/Met Date Met:  09/29/14 Goal: MD aware of Cardiac Marker results Outcome: Completed/Met Date Met:  09/29/14 Goal: Voiding-avoid urinary catheter unless indicated Outcome: Completed/Met Date Met:  09/29/14 Goal: Other Phase I Outcomes/Goals Outcome: Completed/Met Date Met:  09/29/14  Problem: Phase II Progression Outcomes Goal: Hemodynamically stable Outcome: Completed/Met Date Met:  09/29/14 Goal: Anginal pain relieved Outcome: Completed/Met Date Met:  09/29/14 Goal: Stress Test if indicated Outcome: Completed/Met Date Met:  09/29/14 Goal: Cardiac Rehab if ordered Outcome: Completed/Met Date Met:  09/29/14

## 2014-09-29 NOTE — Plan of Care (Signed)
Problem: Consults Goal: Ischemic Stroke Patient Education See Patient Education Module for education specifics. Outcome: Completed/Met Date Met:  09/29/14 Goal: Skin Care Protocol Initiated - if Braden Score 18 or less If consults are not indicated, leave blank or document [name] Outcome: Not Applicable Date Met:  09/29/14 Goal: Nutrition Consult-if indicated Outcome: Not Applicable Date Met:  09/29/14 Goal: Diabetes Guidelines if Diabetic/Glucose > 140 If diabetic or lab glucose is > 140 mg/dl - Initiate Diabetes/Hyperglycemia Guidelines & Document Interventions  Outcome: Not Applicable Date Met:  09/29/14  Problem: Acute Treatment Outcomes Goal: Neuro exam at baseline or improved Outcome: Completed/Met Date Met:  09/29/14 Goal: BP within ordered parameters Outcome: Completed/Met Date Met:  09/29/14 Goal: Airway maintained/protected Outcome: Completed/Met Date Met:  09/29/14 Goal: 02 Sats > 94% Outcome: Completed/Met Date Met:  09/29/14 Goal: Hemodynamically stable Outcome: Completed/Met Date Met:  09/29/14 Goal: tPA Patient w/o S&S of bleeding Outcome: Not Applicable Date Met:  09/29/14 Goal: Prognosis discussed with family/patient as appropriate Outcome: Completed/Met Date Met:  09/29/14 Goal: Other Acute Treatment Outcomes Outcome: Completed/Met Date Met:  09/29/14     

## 2014-09-29 NOTE — Evaluation (Addendum)
Occupational Therapy Evaluation Patient Details Name: Kelly Kennedy MRN: [id number] DOB: 07-Apr-1941 Today's Date: 09/29/2014    History of Present Illness 73 y.o. admitted for chest pain and numbness/tingling in LUE and LLE.   Clinical Impression   Pt admitted for above. Pt moving well and education completed. Pt has no further OT needs. Will sign off.     Follow Up Recommendations  No OT follow up; supervision intermittent   Equipment Recommendations  None recommended by OT    Recommendations for Other Services       Precautions / Restrictions Restrictions Weight Bearing Restrictions: No      Mobility Bed Mobility               General bed mobility comments: sitting EOB  Transfers Overall transfer level: Modified independent                    Balance                                            ADL Overall ADL's : Needs assistance/impaired     Grooming: Supervision/safety;Standing (supervision to gather items)               Lower Body Dressing: Sit to/from stand;Supervision/safety (supervision to gather items)   Toilet Transfer: Supervision/safety;Ambulation (bed) Toilet Transfer Details (indicate cue type and reason):       Tub/ Shower Transfer: Supervision/safety;Ambulation (stepping over simulated tub)   Functional mobility during ADLs: Supervision/safety General ADL Comments: Pt moving well. Practiced tub transfer and recommended sitting on chair for LB bathing and to have someone with her first few times getting in tub just to be sure she is safe. Discussed what she could use for shower chair (pt thinks she can borrow one and not interested in 3 in 1). Discussed taking breaks/pacing self due to back pain.  Educated on safety.      Vision                     Perception     Praxis      Pertinent Vitals/Pain Pain Assessment: No/denies pain     Hand Dominance     Extremity/Trunk Assessment Upper  Extremity Assessment Upper Extremity Assessment: Overall WFL for tasks assessed   Lower Extremity Assessment Lower Extremity Assessment:  (pain in RLE at times; previous surgery on left ankle)       Communication Communication Communication: No difficulties   Cognition Arousal/Alertness: Awake/alert Behavior During Therapy: WFL for tasks assessed/performed Overall Cognitive Status: Within Functional Limits for tasks assessed                     General Comments       Exercises       Shoulder Instructions      Home Living Family/patient expects to be discharged to:: Private residence Living Arrangements: Alone Available Help at Discharge: Family;Available 24 hours/day Type of Home: House Home Access: Stairs to enter [employer] of Steps: 3 Entrance Stairs-Rails: None Home Layout: One level     Bathroom Shower/Tub: Teacher, early years/pre: Handicapped height (sink close)     Home Equipment: Environmental consultant - 2 wheels;Cane - single point          Prior Functioning/Environment Level of Independence: Independent with assistive device(s)  Comments: cane sometimes    OT Diagnosis: Generalized weakness   OT Problem List:     OT Treatment/Interventions:      OT Goals(Current goals can be found in the care plan section)    OT Frequency:     Barriers to D/C:            Co-evaluation              End of Session Equipment Utilized During Treatment: Gait belt  Pt left in bed with call bell within reach  Activity Tolerance: Patient tolerated treatment well Patient left: in bed;with call bell/phone within reach   Time: 1287-8676 OT Time Calculation (min): 11 min Charges:  OT General Charges $OT Visit: 1 Procedure OT Evaluation $Initial OT Evaluation Tier I: 1 Procedure G-Codes: OT G-codes **NOT FOR INPATIENT CLASS** Functional Assessment Tool Used: clinical judgment Functional Limitation: Self care Self Care Current  Status (H2094): At least 1 percent but less than 20 percent impaired, limited or restricted Self Care Goal Status (B0962): At least 1 percent but less than 20 percent impaired, limited or restricted Self Care Discharge Status [id number]): At least 1 percent but less than 20 percent impaired, limited or restricted  Benito Mccreedy OTR/L [phone] 09/29/2014, 6:22 PM

## 2014-09-29 NOTE — Progress Notes (Signed)
OT Cancellation Note  Patient Details Name: RONYA GILCREST MRN: [id number] DOB: 1941/08/07   Cancelled Treatment:    Reason Eval/Treat Not Completed: Patient at procedure or test/ unavailable  Benito Mccreedy OTR/L [phone] 09/29/2014, 3:21 PM

## 2014-09-29 NOTE — Consult Note (Signed)
Reason for Consult: Chest pain  Requesting Physician: Allyson Sabal  Cardiologist: None  HPI: This is a 73 y.o. female with a past medical history significant for possible endocarditis in the 1980s (may be mis-charting of pericarditis), HTN, hyperlipidemia, hyperglycemia (A1c 6.9% suggests full blown DM, diet controlled) presents with recent onset (1 week) episodic chest pain, often (but not always) exertion related, precordial bloating radiating to right chest, lasting several seconds, maybe relieved by rest. At times associates palpitations (heart "turning over"). Simultaneously, has had headaches and an episode of transient left hemianesthesia/paresthesia. Mild exertional dyspnea (has to take a break before finishing cleaning living room).  Her son had sudden death at age 105 - autopsy -"massive heart attack". No other family history of premature cardiac or vascular illness.  Her ECG is abnormal and there are subtle dynamic changes since yesterday - more obvious ST depression and T wave inversion inferolaterally. Borderline long QT.  PMHx:  Past Medical History  Diagnosis Date  . Hyperlipidemia   . Diverticulosis of colon 06/2005  . Hypothyroid   . Insomnia   . Menopausal symptoms   . Hypertension   . Hyperglycemia   . adenomatous Colon polyps     colonoscopy 8/99, 12/02, 8/06  . Pericarditis 1980's  . Chest pain     uses NTG as needed  . Depression   . Bell's palsy   . GERD (gastroesophageal reflux disease)   . H/O hiatal hernia   . Headache(784.0)   . Arthritis   . Irritable bowel   . Diabetes mellitus without complication    Past Surgical History  Procedure Laterality Date  . Partial hysterectomy      ovaries intact  . Cholecystectomy  1990  . Ectopic pregnancy surgery  1971  . Cystectomy  05/04/08    left lower arm, benign  . Tonsillectomy    . Appendectomy    . Cardiac catheterization    . Fracture surgery      nose  . Tubal ligation    . Orif ankle  fracture Left 10/23/2013    Procedure: OPEN REDUCTION INTERNAL FIXATION (ORIF) LEFT ANKLE FRACTURE;  Surgeon: Marianna Payment, MD;  Location: [facility];  Service: Orthopedics;  Laterality: Left;    FAMHx: Family History  Problem Relation Age of Onset  . Cancer Father     stomach   . Heart disease Son     MI  . Arthritis Mother   . Diabetes Mother   . Colon cancer Neg Hx   . Pancreatic cancer Neg Hx   . Stomach cancer Neg Hx   . Breast cancer Neg Hx     SOCHx:  reports that she has quit smoking. She has quit using smokeless tobacco. She reports that she does not drink alcohol or use illicit drugs.  ALLERGIES: Allergies  Allergen Reactions  . Niacin Rash  . Red Dye Rash    ROS: The patient specifically denies dyspnea at rest, orthopnea, paroxysmal nocturnal dyspnea, syncope, intermittent claudication, lower extremity edema, unexplained weight gain, cough, hemoptysis or wheezing.  The patient also denies abdominal pain, nausea, vomiting, dysphagia, diarrhea, constipation, polyuria, polydipsia, dysuria, hematuria, frequency, urgency, abnormal bleeding or bruising, fever, chills, unexpected weight changes, mood swings, change in skin or hair texture, change in voice quality, auditory or visual problems, allergic reactions or rashes, new musculoskeletal complaints other than usual "aches and pains".     HOME MEDICATIONS: Prescriptions prior to admission  Medication Sig Dispense Refill Last Dose  .  amitriptyline (ELAVIL) 50 MG tablet Take 50-100 mg by mouth at bedtime.   09/27/2014 at Unknown time  . aspirin EC 81 MG tablet Take 81 mg by mouth daily.   09/28/2014 at Unknown time  . Aspirin-Salicylamide-Caffeine (BC HEADACHE PO) Take 1 packet by mouth daily as needed (headache).    Past Week at Unknown time  . Carboxymethylcellul-Glycerin (OPTIVE) 0.5-0.9 % SOLN Apply 1 drop to eye 2 (two) times daily as needed (for dry eyes).   09/27/2014 at Unknown time  . estradiol (CLIMARA -  DOSED IN MG/24 HR) 0.1 mg/24hr patch Place 1 patch (0.1 mg total) onto the skin once a week. 12 patch 3 Past Week at Unknown time  . fluocinonide cream (LIDEX) 5.05 % Apply 1 application topically as needed (for rash).   Past Week at Unknown time  . furosemide (LASIX) 20 MG tablet Take 1 tablet (20 mg total) by mouth daily. 90 tablet 3 09/28/2014 at Unknown time  . HYDROcodone-acetaminophen (NORCO/VICODIN) 5-325 MG per tablet Take 2 tablets by mouth every 4 (four) hours as needed for moderate pain. 30 tablet 0 09/27/2014 at Unknown time  . Hypromellose (GENTEAL OP) Apply 1 application to eye at bedtime.   09/27/2014 at Unknown time  . ketotifen (ZADITOR) 0.025 % ophthalmic solution Place 1 drop into both eyes daily.   09/28/2014 at Unknown time  . levothyroxine (SYNTHROID, LEVOTHROID) 100 MCG tablet Take one tablet by mouth daily except on Monday and Wednesday take 1/2 tablet 90 tablet 3 09/28/2014 at Unknown time  . levothyroxine (SYNTHROID, LEVOTHROID) 100 MCG tablet Take 50-100 mcg by mouth daily before breakfast. Takes 15mcg on Mon and Wed Takes 164mcg all other days   09/28/2014 at Unknown time  . losartan (COZAAR) 100 MG tablet Take 1 tablet (100 mg total) by mouth daily. To replace lisinopril rx 90 tablet 3 09/28/2014 at Unknown time  . Multiple Vitamins-Minerals (PRESERVISION AREDS 2) CAPS Take 2 capsules by mouth daily.   09/28/2014 at Unknown time  . nitroGLYCERIN (NITROSTAT) 0.4 MG SL tablet Place 0.4 mg under the tongue every 5 (five) minutes as needed for chest pain.    Past Week at Unknown time  . potassium chloride (K-DUR) 10 MEQ tablet Take 1 tablet (10 mEq total) by mouth daily. 90 tablet 3 09/28/2014 at Unknown time  . rosuvastatin (CRESTOR) 10 MG tablet Take 10 mg by mouth at bedtime.   09/27/2014 at Unknown time  . acetaminophen (TYLENOL) 500 MG tablet Take 500 mg by mouth every 6 (six) hours as needed for mild pain.    Taking  . amitriptyline (ELAVIL) 50 MG tablet Take 1-2 tablets  (50-100 mg total) by mouth at bedtime as needed for sleep. 180 tablet 3   . CRESTOR 10 MG tablet TAKE 1 TABLET BY MOUTH EVERY DAY 30 tablet 10   . fluocinonide cream (LIDEX) 0.05 % APPLY TOPICALLY DAILY AS NEEDED 30 g 5   . OVER THE COUNTER MEDICATION Take 2 tablets by mouth daily. Eye Vitamin   Taking  . promethazine (PHENERGAN) 25 MG tablet Take 1 tablet (25 mg total) by mouth every 6 (six) hours as needed for nausea. 30 tablet 1 Taking  . rosuvastatin (CRESTOR) 10 MG tablet Take 1 tablet (10 mg total) by mouth daily. 90 tablet 3   . triamcinolone cream (KENALOG) 0.1 % Apply 1 application topically 2 (two) times daily. 30 g 0 Taking    HOSPITAL MEDICATIONS: Prior to Admission:  Prescriptions prior to admission  Medication Sig  Dispense Refill Last Dose  . amitriptyline (ELAVIL) 50 MG tablet Take 50-100 mg by mouth at bedtime.   09/27/2014 at Unknown time  . aspirin EC 81 MG tablet Take 81 mg by mouth daily.   09/28/2014 at Unknown time  . Aspirin-Salicylamide-Caffeine (BC HEADACHE PO) Take 1 packet by mouth daily as needed (headache).    Past Week at Unknown time  . Carboxymethylcellul-Glycerin (OPTIVE) 0.5-0.9 % SOLN Apply 1 drop to eye 2 (two) times daily as needed (for dry eyes).   09/27/2014 at Unknown time  . estradiol (CLIMARA - DOSED IN MG/24 HR) 0.1 mg/24hr patch Place 1 patch (0.1 mg total) onto the skin once a week. 12 patch 3 Past Week at Unknown time  . fluocinonide cream (LIDEX) 2.02 % Apply 1 application topically as needed (for rash).   Past Week at Unknown time  . furosemide (LASIX) 20 MG tablet Take 1 tablet (20 mg total) by mouth daily. 90 tablet 3 09/28/2014 at Unknown time  . HYDROcodone-acetaminophen (NORCO/VICODIN) 5-325 MG per tablet Take 2 tablets by mouth every 4 (four) hours as needed for moderate pain. 30 tablet 0 09/27/2014 at Unknown time  . Hypromellose (GENTEAL OP) Apply 1 application to eye at bedtime.   09/27/2014 at Unknown time  . ketotifen (ZADITOR) 0.025 %  ophthalmic solution Place 1 drop into both eyes daily.   09/28/2014 at Unknown time  . levothyroxine (SYNTHROID, LEVOTHROID) 100 MCG tablet Take one tablet by mouth daily except on Monday and Wednesday take 1/2 tablet 90 tablet 3 09/28/2014 at Unknown time  . levothyroxine (SYNTHROID, LEVOTHROID) 100 MCG tablet Take 50-100 mcg by mouth daily before breakfast. Takes 36mcg on Mon and Wed Takes 123mcg all other days   09/28/2014 at Unknown time  . losartan (COZAAR) 100 MG tablet Take 1 tablet (100 mg total) by mouth daily. To replace lisinopril rx 90 tablet 3 09/28/2014 at Unknown time  . Multiple Vitamins-Minerals (PRESERVISION AREDS 2) CAPS Take 2 capsules by mouth daily.   09/28/2014 at Unknown time  . nitroGLYCERIN (NITROSTAT) 0.4 MG SL tablet Place 0.4 mg under the tongue every 5 (five) minutes as needed for chest pain.    Past Week at Unknown time  . potassium chloride (K-DUR) 10 MEQ tablet Take 1 tablet (10 mEq total) by mouth daily. 90 tablet 3 09/28/2014 at Unknown time  . rosuvastatin (CRESTOR) 10 MG tablet Take 10 mg by mouth at bedtime.   09/27/2014 at Unknown time  . acetaminophen (TYLENOL) 500 MG tablet Take 500 mg by mouth every 6 (six) hours as needed for mild pain.    Taking  . amitriptyline (ELAVIL) 50 MG tablet Take 1-2 tablets (50-100 mg total) by mouth at bedtime as needed for sleep. 180 tablet 3   . CRESTOR 10 MG tablet TAKE 1 TABLET BY MOUTH EVERY DAY 30 tablet 10   . fluocinonide cream (LIDEX) 0.05 % APPLY TOPICALLY DAILY AS NEEDED 30 g 5   . OVER THE COUNTER MEDICATION Take 2 tablets by mouth daily. Eye Vitamin   Taking  . promethazine (PHENERGAN) 25 MG tablet Take 1 tablet (25 mg total) by mouth every 6 (six) hours as needed for nausea. 30 tablet 1 Taking  . rosuvastatin (CRESTOR) 10 MG tablet Take 1 tablet (10 mg total) by mouth daily. 90 tablet 3   . triamcinolone cream (KENALOG) 0.1 % Apply 1 application topically 2 (two) times daily. 30 g 0 Taking    VITALS: Blood pressure  150/70, pulse 87, temperature  98.1 F (36.7 C), temperature source Oral, resp. rate 18, height 5\' 3"  (1.6 m), weight 183 lb 12.8 oz (83.371 kg), SpO2 100 %.  PHYSICAL EXAM:  General: Alert, oriented x3, no distress Head: no evidence of trauma, PERRL, EOMI, no exophtalmos or lid lag, no myxedema, no xanthelasma; normal ears, nose and oropharynx Neck: normal jugular venous pulsations and no hepatojugular reflux; brisk carotid pulses without delay and no carotid bruits Chest: clear to auscultation, no signs of consolidation by percussion or palpation, normal fremitus, symmetrical and full respiratory excursions Cardiovascular: normal position and quality of the apical impulse, regular rhythm, normal first and second heart sounds, no murmurs, rubs or gallops Abdomen: no tenderness or distention, no masses by palpation, no abnormal pulsatility or arterial bruits, normal bowel sounds, no hepatosplenomegaly Extremities: no clubbing, cyanosis or edema; 2+ radial, ulnar and brachial pulses bilaterally; 2+ right femoral, posterior tibial and dorsalis pedis pulses; 2+ left femoral, posterior tibial and dorsalis pedis pulses; no subclavian or femoral bruits Neurological: grossly nonfocal   LABS  CBC  Recent Labs  09/28/14 1751  WBC 6.5  HGB 12.2  HCT 36.5  MCV 85.7  PLT 720   Basic Metabolic Panel  Recent Labs  09/28/14 1751 09/29/14 0538  NA 142  --   K 3.6*  --   CL 102  --   CO2 28  --   GLUCOSE 81  --   BUN 8  --   CREATININE 0.67  --   CALCIUM 9.3  --   MG  --  1.7   Liver Function Tests No results for input(s): AST, ALT, ALKPHOS, BILITOT, PROT, ALBUMIN in the last 72 hours. No results for input(s): LIPASE, AMYLASE in the last 72 hours. Cardiac Enzymes  Recent Labs  09/29/14 0015 09/29/14 0538  TROPONINI <0.30 <0.30   BNP Invalid input(s): POCBNP D-Dimer No results for input(s): DDIMER in the last 72 hours. Hemoglobin A1C No results for input(s): HGBA1C in the  last 72 hours. Fasting Lipid Panel  Recent Labs  09/29/14 0538  CHOL 120  HDL 56  LDLCALC 42  TRIG 110  CHOLHDL 2.1   Thyroid Function Tests No results for input(s): TSH, T4TOTAL, T3FREE, THYROIDAB in the last 72 hours.  Invalid input(s): FREET3    IMAGING: Dg Chest 2 View  09/28/2014   CLINICAL DATA:  Two week history of cardiac arrhythmia and difficulty breathing  EXAM: CHEST  2 VIEW  COMPARISON:  October 17, 2013  FINDINGS: There is slight scarring in the left base region. There is no edema or consolidation. The heart size and pulmonary vascularity are normal. No adenopathy. There is mild degenerative change in the thoracic spine.  IMPRESSION: No edema or consolidation.   Electronically Signed   By: Lowella Grip M.D.   On: 09/28/2014 18:25   Ct Head Wo Contrast  09/28/2014   CLINICAL DATA:  Tingling and numbness in the left arm and leg. Paresthesias.  EXAM: CT HEAD WITHOUT CONTRAST  TECHNIQUE: Contiguous axial images were obtained from the base of the skull through the vertex without contrast.  COMPARISON:  None  FINDINGS: Normal appearance of the intracranial structures. No evidence for acute hemorrhage, mass lesion, midline shift, hydrocephalus or large infarct. No acute bony abnormality. The visualized sinuses are clear.  IMPRESSION: No acute intracranial abnormality.   Electronically Signed   By: Markus Daft M.D.   On: 09/28/2014 20:30    ECG: NSR, ST-T changes inferolaterally (suggest LVH, but no voltage criteria). Slightly  increased on 11/2 compared to 11/1  TELEMETRY: NSR  IMPRESSION: 1. Possible TIA 2. Possible new onset angina - atypical (episodes are very brief) and abnormal ECG 3. Multiple coronary risk factors - well treated hypercholesterolemia and diet controlled DM, BP slightly high 4. Borderline prolonged QT interval 5. Family history of sudden cardiac death in child  Unifying diagnosis might be atrial fibrillation causing both transient chest symptoms  and TIA, on the background of inheritable or hypertension-related cardiomyopathy.   RECOMMENDATION: 1. Treadmill Myoview 2. Echo 3. Telemetry 4. Carotid Dopplers and MRI brain have been ordered 5. Avoid QT prolonging agents 6. If workup is non-diagnostic, recommend 30 day event monitor at discharge  Time Spent Directly with Patient: 60 minutes  Sanda Klein, MD, [facility] [phone] office [phone] pager   09/29/2014, 8:40 AM

## 2014-09-29 NOTE — Progress Notes (Signed)
GXT CL performed, results pending.

## 2014-09-30 ENCOUNTER — Other Ambulatory Visit: Payer: Self-pay | Admitting: Physician Assistant

## 2014-09-30 DIAGNOSIS — G459 Transient cerebral ischemic attack, unspecified: Secondary | ICD-10-CM

## 2014-09-30 LAB — GLUCOSE, CAPILLARY
GLUCOSE-CAPILLARY: 104 mg/dL — AB (ref 70–99)
GLUCOSE-CAPILLARY: 95 mg/dL (ref 70–99)
Glucose-Capillary: 119 mg/dL — ABNORMAL HIGH (ref 70–99)
Glucose-Capillary: 89 mg/dL (ref 70–99)

## 2014-09-30 NOTE — Discharge Summary (Signed)
Physician Discharge Summary  Kelly Kennedy MRN: [id number] DOB/AGE: [birth year] 73 y.o.  PCP: [name], MD   Admit date: 09/28/2014 Discharge date: 09/30/2014  Discharge Diagnoses:    Type 2 diabetes, diet controlled  Chest pain  Paresthesia  Essential hypertension  TIA (transient ischemic attack) Prolonged QT interval Family history of sudden cardiac death in child   Follow-up recommendations Follow-up with PCP in 5-7 days Avoid QT prolonging agents 30 day event monitor at discharge    Medication List    STOP taking these medications       BC HEADACHE PO      TAKE these medications       acetaminophen 500 MG tablet  Commonly known as: TYLENOL  Take 500 mg by mouth every 6 (six) hours as needed for mild pain.     amitriptyline 50 MG tablet  Commonly known as: ELAVIL  Take 1-2 tablets (50-100 mg total) by mouth at bedtime as needed for sleep.     aspirin EC 81 MG tablet  Take 81 mg by mouth daily.     CRESTOR 10 MG tablet  Generic drug: rosuvastatin  TAKE 1 TABLET BY MOUTH EVERY DAY     estradiol 0.1 mg/24hr patch  Commonly known as: CLIMARA - Dosed in mg/24 hr  Place 1 patch (0.1 mg total) onto the skin once a week.     fluocinonide cream 0.05 %  Commonly known as: LIDEX  APPLY TOPICALLY DAILY AS NEEDED     furosemide 20 MG tablet  Commonly known as: LASIX  Take 1 tablet (20 mg total) by mouth daily.     GENTEAL OP  Apply 1 application to eye at bedtime.     glipiZIDE 2.5 mg Tabs tablet  Commonly known as: GLUCOTROL  Take 0.5 tablets (2.5 mg total) by mouth daily before breakfast.  Start taking on: 09/30/2014     HYDROcodone-acetaminophen 5-325 MG per tablet  Commonly known as: NORCO/VICODIN  Take 2 tablets by mouth every 4 (four) hours as needed for moderate pain.     ketotifen 0.025 % ophthalmic solution  Commonly known as: ZADITOR  Place 1 drop into  both eyes daily.     levothyroxine 100 MCG tablet  Commonly known as: SYNTHROID, LEVOTHROID  - Take 50-100 mcg by mouth daily before breakfast. Takes 31mg on Mon and Wed  - Takes 1029m all other days     levothyroxine 100 MCG tablet  Commonly known as: SYNTHROID, LEVOTHROID  Take one tablet by mouth daily except on Monday and Wednesday take 1/2 tablet     losartan 100 MG tablet  Commonly known as: COZAAR  Take 1 tablet (100 mg total) by mouth daily. To replace lisinopril rx     magnesium oxide 400 (241.3 MG) MG tablet  Commonly known as: MAG-OX  Take 1 tablet (400 mg total) by mouth daily.     nitroGLYCERIN 0.4 MG SL tablet  Commonly known as: NITROSTAT  Place 0.4 mg under the tongue every 5 (five) minutes as needed for chest pain.     OPTIVE 0.5-0.9 % Soln  Generic drug: Carboxymethylcellul-Glycerin  Apply 1 drop to eye 2 (two) times daily as needed (for dry eyes).     OVER THE COUNTER MEDICATION  Take 2 tablets by mouth daily. Eye Vitamin     potassium chloride 10 MEQ tablet  Commonly known as: K-DUR  Take 1 tablet (10 mEq total) by mouth daily.     PRESERVISION AREDS 2 Caps  Take 2 capsules by mouth daily.     promethazine 25 MG tablet  Commonly known as: PHENERGAN  Take 1 tablet (25 mg total) by mouth every 6 (six) hours as needed for nausea.     triamcinolone cream 0.1 %  Commonly known as: KENALOG  Apply 1 application topically 2 (two) times daily.        Discharge Condition: stable  Disposition: 01-Home or Self Care   Consults:  Cardiology   Significant Diagnostic Studies:  Imaging Results    Dg Chest 2 View  09/28/2014 CLINICAL DATA: Two week history of cardiac arrhythmia and difficulty breathing EXAM: CHEST 2 VIEW COMPARISON: October 17, 2013 FINDINGS: There is slight scarring in the left base region. There is no edema or consolidation. The heart size and pulmonary  vascularity are normal. No adenopathy. There is mild degenerative change in the thoracic spine. IMPRESSION: No edema or consolidation. Electronically Signed By: Lowella Grip M.D. On: 09/28/2014 18:25   Ct Head Wo Contrast  09/28/2014 CLINICAL DATA: Tingling and numbness in the left arm and leg. Paresthesias. EXAM: CT HEAD WITHOUT CONTRAST TECHNIQUE: Contiguous axial images were obtained from the base of the skull through the vertex without contrast. COMPARISON: None FINDINGS: Normal appearance of the intracranial structures. No evidence for acute hemorrhage, mass lesion, midline shift, hydrocephalus or large infarct. No acute bony abnormality. The visualized sinuses are clear. IMPRESSION: No acute intracranial abnormality. Electronically Signed By: Markus Daft M.D. On: 09/28/2014 20:30       Microbiology: No results found for this or any previous visit (from the past 240 hour(s)).   Labs:  Lab Results Last 48 Hours    Results for orders placed or performed during the hospital encounter of 09/28/14 (from the past 48 hour(s))  CBC Status: None   Collection Time: 09/28/14 5:51 PM  Result Value Ref Range   WBC 6.5 4.0 - 10.5 K/uL   RBC 4.26 3.87 - 5.11 MIL/uL   Hemoglobin 12.2 12.0 - 15.0 g/dL   HCT 36.5 36.0 - 46.0 %   MCV 85.7 78.0 - 100.0 fL   MCH 28.6 26.0 - 34.0 pg   MCHC 33.4 30.0 - 36.0 g/dL   RDW 14.8 11.5 - 15.5 %   Platelets 333 150 - 400 K/uL  Basic metabolic panel Status: Abnormal   Collection Time: 09/28/14 5:51 PM  Result Value Ref Range   Sodium 142 137 - 147 mEq/L   Potassium 3.6 (L) 3.7 - 5.3 mEq/L   Chloride 102 96 - 112 mEq/L   CO2 28 19 - 32 mEq/L   Glucose, Bld 81 70 - 99 mg/dL   BUN 8 6 - 23 mg/dL   Creatinine, Ser 0.67 0.50 - 1.10 mg/dL   Calcium 9.3 8.4 - 10.5 mg/dL   GFR calc non Af Amer 86 (L) >90 mL/min   GFR calc Af Amer >90 >90  mL/min    Comment: (NOTE) The eGFR has been calculated using the CKD EPI equation. This calculation has not been validated in all clinical situations. eGFR's persistently <90 mL/min signify possible Chronic Kidney Disease.    Anion gap 12 5 - 15  BNP (order ONLY if patient complains of dyspnea/SOB AND you have documented it for THIS visit) Status: None   Collection Time: 09/28/14 5:51 PM  Result Value Ref Range   Pro B Natriuretic peptide (BNP) 36.7 0 - 125 pg/mL  I-stat troponin, ED (not at [facility]) Status: None   Collection Time: 09/28/14 7:15 PM  Result  Value Ref Range   Troponin i, poc 0.01 0.00 - 0.08 ng/mL   Comment 3       Comment: Due to the release kinetics of cTnI, a negative result within the first hours of the onset of symptoms does not rule out myocardial infarction with certainty. If myocardial infarction is still suspected, repeat the test at appropriate intervals.   Troponin I (q 6hr x 3) Status: None   Collection Time: 09/29/14 12:15 AM  Result Value Ref Range   Troponin I <0.30 <0.30 ng/mL    Comment:   Due to the release kinetics of cTnI, a negative result within the first hours of the onset of symptoms does not rule out myocardial infarction with certainty. If myocardial infarction is still suspected, repeat the test at appropriate intervals.   Glucose, capillary Status: Abnormal   Collection Time: 09/29/14 12:24 AM  Result Value Ref Range   Glucose-Capillary 138 (H) 70 - 99 mg/dL  Glucose, capillary Status: Abnormal   Collection Time: 09/29/14 4:17 AM  Result Value Ref Range   Glucose-Capillary 101 (H) 70 - 99 mg/dL  Magnesium Status: None   Collection Time: 09/29/14 5:38 AM  Result Value Ref Range   Magnesium 1.7 1.5 - 2.5 mg/dL  Troponin I (q 6hr x 3) Status: None   Collection Time: 09/29/14 5:38 AM  Result Value Ref  Range   Troponin I <0.30 <0.30 ng/mL    Comment:   Due to the release kinetics of cTnI, a negative result within the first hours of the onset of symptoms does not rule out myocardial infarction with certainty. If myocardial infarction is still suspected, repeat the test at appropriate intervals.   Lipid panel Status: None   Collection Time: 09/29/14 5:38 AM  Result Value Ref Range   Cholesterol 120 0 - 200 mg/dL   Triglycerides 110 <150 mg/dL   HDL 56 >39 mg/dL   Total CHOL/HDL Ratio 2.1 RATIO   VLDL 22 0 - 40 mg/dL   LDL Cholesterol 42 0 - 99 mg/dL    Comment:   Total Cholesterol/HDL:CHD Risk Coronary Heart Disease Risk Table  Men Women 1/2 Average Risk 3.4 3.3 Average Risk 5.0 4.4 2 X Average Risk 9.6 7.1 3 X Average Risk 23.4 11.0   Use the calculated Patient Ratio above and the CHD Risk Table to determine the patient's CHD Risk.   ATP III CLASSIFICATION (LDL): <100 mg/dL Optimal 100-129 mg/dL Near or Above  Optimal 130-159 mg/dL Borderline 160-189 mg/dL High >190 mg/dL Very High   Urinalysis, Routine w reflex microscopic Status: None   Collection Time: 09/29/14 6:45 AM  Result Value Ref Range   Color, Urine YELLOW YELLOW   APPearance CLEAR CLEAR   Specific Gravity, Urine 1.009 1.005 - 1.030   pH 6.5 5.0 - 8.0   Glucose, UA NEGATIVE NEGATIVE mg/dL   Hgb urine dipstick NEGATIVE NEGATIVE   Bilirubin Urine NEGATIVE NEGATIVE   Ketones, ur NEGATIVE NEGATIVE mg/dL   Protein, ur NEGATIVE NEGATIVE mg/dL   Urobilinogen, UA 0.2 0.0 - 1.0 mg/dL   Nitrite NEGATIVE NEGATIVE   Leukocytes, UA NEGATIVE NEGATIVE    Comment: MICROSCOPIC NOT DONE ON URINES WITH NEGATIVE PROTEIN, BLOOD, LEUKOCYTES, NITRITE, OR GLUCOSE <1000 mg/dL.  Glucose, capillary  Status: None   Collection Time: 09/29/14 9:43 AM  Result Value Ref Range   Glucose-Capillary 98 70 - 99 mg/dL   Comment 1 Documented in Chart    Comment 2 Notify RN  HPI   73 y.o. female with a past medical history significant for possible endocarditis in the 1980s (may be mis-charting of pericarditis), HTN, hyperlipidemia, hyperglycemia (A1c 6.9% suggests full blown DM, diet controlled) presents with recent onset (1 week) episodic chest pain, often (but not always) exertion related, precordial bloating radiating to right chest, lasting several seconds, maybe relieved by rest. At times associates palpitations (heart "turning over"). Simultaneously, has had headaches and an episode of transient left hemianesthesia/paresthesia. Mild exertional dyspnea (has to take a break before finishing cleaning living room).  Her son had sudden death at age 15 - autopsy -"massive heart attack". No other family history of premature cardiac or vascular illness.  Her ECG is abnormal and there are subtle dynamic changes since yesterday - more obvious ST depression and T wave inversion inferolaterally. Borderline long QT. 1 week ago she had an episode of numbness of left leg and arm lasting about 5 min. Since then she had some mild tingling in that area. She was able to walk, denies any slurred speech. Family states she has not had much appetite since then and was fatigued. This was preceded by some diarrhea that has resolved by now. Few days later she started to have some intermittent chest pains and palpitations   HOSPITAL COURSE:  . Chest pain - atypical - given risk factors patient was seen by cardiology, they recommended an inpatient treadmill Myoview, EKG telemetry showed ST-T changes in the inferolateral leads suggesting left ventricle hypertrophy as well as prolonged QT interval. Cardiology does suspect underlying atrial fibrillation which is brought probably paroxysmal. They  recommend a 30 day event monitor at discharge, cardiac enzymes were negative, Lipid panel shows an LDL of 42, HDL of 56, hemoglobin A1c 6.9 on 8/15, patient on estradiol patch, negative d-dimer . continue Aspirin.  Right: 1-39% ICA stenosis. ECA stenosis. Vertebral artery flow is antegrade. Left: 40-59% ICA stenosis. Treadmill Myoview showed IMPRESSION: 1. No reversible ischemia or infarction. 2. Normal left ventricular wall motion. 3. Left ventricular ejection fraction 62% 4. Low-risk stress test findings*.   . Essential hypertension - continue cozaar  . TIA (transient ischemic attack) - - evaluated forTIA/CVA protocol, await results of MRA/MRI, Carotid Doppler and Echo, obtain cardiac enzymes, ECG,pending PT/OT evaluation.continue aspirin   DM 2 -started on glipizide 2.5 mg, diet controlled,previous hemoglobin A1c was 6.9  Hypokalemia - continue oral potassium, magnesium 1.7. Patient started on magnesium oxide 400 mg daily  Recent diarrhea resolved with evidence of fluid depletion - hydrated with IV fluids    Discharge Exam:  Blood pressure 150/88, pulse 84, temperature 98.2 F (36.8 C), temperature source Oral, resp. rate 16, height '5\' 3"'  (1.6 m), weight 83.371 kg (183 lb 12.8 oz), SpO2 99 %.  General: Alert, oriented x3, no distress Head: no evidence of trauma, PERRL, EOMI, no exophtalmos or lid lag, no myxedema, no xanthelasma; normal ears, nose and oropharynx Neck: normal jugular venous pulsations and no hepatojugular reflux; brisk carotid pulses without delay and no carotid bruits Chest: clear to auscultation, no signs of consolidation by percussion or palpation, normal fremitus, symmetrical and full respiratory excursions Cardiovascular: normal position and quality of the apical impulse, regular rhythm, normal first and second heart sounds, no murmurs, rubs or gallops Abdomen: no tenderness or distention, no masses by palpation, no abnormal pulsatility or  arterial bruits, normal bowel sounds, no hepatosplenomegaly Extremities: no clubbing, cyanosis or edema; 2+ radial, ulnar and brachial pulses bilaterally; 2+ right femoral, posterior tibial and dorsalis pedis pulses; 2+  left femoral, posterior tibial and dorsalis pedis pulses; no subclavian or femoral bruits Neurological: grossly nonfocal         Follow-up Information    Follow up with [name], MD. Schedule an appointment as soon as possible for a visit in 1 week.   Specialty: Family Medicine   Contact information:   [address] [place] [address] [phone]

## 2014-09-30 NOTE — Telephone Encounter (Signed)
error 

## 2014-09-30 NOTE — Plan of Care (Signed)
Problem: Progression Outcomes Goal: Communication method established Outcome: Completed/Met Date Met:  09/30/14 Goal: If vent dependent, tolerates weaning Outcome: Not Applicable Date Met:  19/95/79 Goal: Progressive activity as tolerated Outcome: Completed/Met Date Met:  09/30/14 Goal: Tolerating diet/TF at goal rate Outcome: Completed/Met Date Met:  09/30/14 Goal: Bowel & Bladder Continence Outcome: Completed/Met Date Met:  09/30/14

## 2014-10-01 ENCOUNTER — Telehealth (HOSPITAL_COMMUNITY): Payer: Self-pay | Admitting: *Deleted

## 2014-10-03 ENCOUNTER — Encounter: Payer: Commercial Managed Care - HMO | Admitting: *Deleted

## 2014-10-03 DIAGNOSIS — G459 Transient cerebral ischemic attack, unspecified: Secondary | ICD-10-CM

## 2014-10-03 DIAGNOSIS — R079 Chest pain, unspecified: Secondary | ICD-10-CM

## 2014-10-03 DIAGNOSIS — R002 Palpitations: Secondary | ICD-10-CM

## 2014-10-06 ENCOUNTER — Other Ambulatory Visit (INDEPENDENT_AMBULATORY_CARE_PROVIDER_SITE_OTHER): Payer: Commercial Managed Care - HMO

## 2014-10-06 DIAGNOSIS — E119 Type 2 diabetes mellitus without complications: Secondary | ICD-10-CM

## 2014-10-06 LAB — HEMOGLOBIN A1C: HEMOGLOBIN A1C: 6.9 % — AB (ref 4.6–6.5)

## 2014-10-09 ENCOUNTER — Encounter: Payer: Self-pay | Admitting: Family Medicine

## 2014-10-09 ENCOUNTER — Ambulatory Visit (INDEPENDENT_AMBULATORY_CARE_PROVIDER_SITE_OTHER): Payer: Commercial Managed Care - HMO | Admitting: Family Medicine

## 2014-10-09 VITALS — BP 146/88 | HR 93 | Temp 98.2°F | Wt 187.2 lb

## 2014-10-09 DIAGNOSIS — G459 Transient cerebral ischemic attack, unspecified: Secondary | ICD-10-CM

## 2014-10-09 DIAGNOSIS — N760 Acute vaginitis: Secondary | ICD-10-CM

## 2014-10-09 MED ORDER — FLUCONAZOLE 150 MG PO TABS: 150.0000 mg | ORAL_TABLET | ORAL | Status: DC

## 2014-10-09 NOTE — Progress Notes (Signed)
Pre visit review using our clinic review tool, if applicable. No additional management support is needed unless otherwise documented below in the visit note.  To recap, admitted with likely TIA likely from PAF with unremarkable imaging and stress testing with complete resolution of L paresthesias, here for f/u.  Prolonged QT hx noted.  No CP, SOB.  No focal neuro changes, no neuro sx.   On monitor per cards.   No syncope.   Has changed her diet, much lower carb intake. Not checking sugars, not interested in checking sugars.  Wants to get off DM2 meds.  No sx of low sugars. D/w pt.  Lipids noted, already on statin. On ASA w/o ADE.   Recently with vaginal itching and white discharge with h/o similar with yeast infections.  Has tolerated diflucan in the past.   All d/w pt, hospital course, labs and imaging, plan.   PMH and SH reviewed  ROS: See HPI, otherwise noncontributory.  Meds, vitals, and allergies reviewed.   GEN: nad, alert and oriented HEENT: mucous membranes moist NECK: supple w/o LA CV: rrr. PULM: ctab, no inc wob ABD: soft, +bs EXT: no edema SKIN: no acute rash CN 2-12 wnl B, S/S/DTR wnl x4

## 2014-10-09 NOTE — Patient Instructions (Signed)
Keep on the low sugar diet.  Don't change your meds for now.  If you have any numbness or weakness, then go to the hospital or dial 911.  Recheck labs here in 3 months before a visit.  I'll await the cardiology notes after you are done with the monitor.

## 2014-10-10 ENCOUNTER — Encounter: Payer: Self-pay | Admitting: Family Medicine

## 2014-10-10 DIAGNOSIS — N760 Acute vaginitis: Secondary | ICD-10-CM | POA: Insufficient documentation

## 2014-10-10 NOTE — Assessment & Plan Note (Signed)
Admitted with likely TIA likely from PAF with unremarkable imaging and stress testing with complete resolution of L paresthesias, here for f/u.  Prolonged QT hx noted.  No CP, SOB.  No focal neuro changes, no neuro sx.   On monitor per cards.   No syncope.   Has changed her diet, much lower carb intake. Not checking sugars, not interested in checking sugars.  Wants to get off DM2 meds.  No sx of low sugars. D/w pt.  Lipids noted, already on statin. On ASA w/o ADE.   At this point, continue as is, await cards monitor results.   She'll f/u for recheck A1c in a few months.  She'll update me with any sx of hypoglycemia in the meantime.  She agrees with plan. If any new neuro sx, to ER.  >25 minutes spent in face to face time with patient, >50% spent in counselling or coordination of care.

## 2014-10-10 NOTE — Assessment & Plan Note (Signed)
Recently with vaginal itching and white discharge with h/o similar with yeast infections.  Has tolerated diflucan in the past.  Restart, f/u prn. She agrees.

## 2014-10-14 ENCOUNTER — Telehealth: Payer: Self-pay | Admitting: Cardiovascular Disease

## 2014-10-20 NOTE — Telephone Encounter (Signed)
Closed encounter °

## 2014-11-03 ENCOUNTER — Telehealth: Payer: Self-pay | Admitting: Cardiovascular Disease

## 2014-11-04 NOTE — Telephone Encounter (Signed)
Closed encounter °

## 2014-11-07 ENCOUNTER — Other Ambulatory Visit: Payer: Self-pay | Admitting: Family Medicine

## 2014-11-07 NOTE — Telephone Encounter (Signed)
Electronic refill request. Last Filled:    180 tablet 3 RF on 07/04/2014  Please advise.

## 2014-11-09 NOTE — Telephone Encounter (Signed)
Check with pharmacy.  She should have refills left from prev. Thanks.

## 2014-11-10 ENCOUNTER — Encounter: Payer: Self-pay | Admitting: Cardiovascular Disease

## 2014-11-10 ENCOUNTER — Ambulatory Visit (INDEPENDENT_AMBULATORY_CARE_PROVIDER_SITE_OTHER): Payer: Commercial Managed Care - HMO | Admitting: Cardiovascular Disease

## 2014-11-10 VITALS — BP 114/70 | HR 90 | Ht 63.0 in | Wt 188.2 lb

## 2014-11-10 DIAGNOSIS — I10 Essential (primary) hypertension: Secondary | ICD-10-CM

## 2014-11-10 DIAGNOSIS — G458 Other transient cerebral ischemic attacks and related syndromes: Secondary | ICD-10-CM

## 2014-11-10 NOTE — Progress Notes (Signed)
Patient ID: Kelly Kennedy, female   DOB: 07-Nov-1941, 73 y.o.   MRN: [id number]      Reason for office visit Follow-up arrhythmia monitor  Mrs. Pavelko was briefly hospitalized in early November with a possible transient ischemic attack. An echocardiogram was ordered during her hospitalization, but it appears that it was never performed before discharge. She described atypical chest discomfort and had a nuclear stress test on 09/29/2014 that was completely normal. EF was 62%. She has palpitations and was sent home with a 30 day event monitor to screen for atrial fibrillation. She is here to follow-up on that. There was no evidence of atrial fibrillation or any other meaningful arrhythmia while she wore the monitor.  She has had the same complaints of left upper extremity and left lower extremity numbness and loss of sensation with tingling on a couple of other occasions since she was discharged from the hospital. The symptoms always begin in the hand and that involve the lower limb as well. They generally last for just a few minutes and seemed to have a very repetitive pattern.   She has a past medical history significant for possible endocarditis in the 1980s (may be mis-charting of pericarditis), HTN, hyperlipidemia, hyperglycemia (A1c 6.9% suggests full blown DM, diet controlled) Described palpitations (heart "turning over"). Simultaneously, has had headaches and an episode of transient left hemianesthesia/paresthesia. MRI/MRA showed no evidence of major arterial problems, did show chronic small vessel ischemic disease. Her son had sudden death at age 80 - autopsy -"massive heart attack". No other family history of premature cardiac or vascular illness. Her ECG is abnormal; ST depression and T wave inversion inferolaterally, borderline long QT.   Allergies  Allergen Reactions  . Niacin Rash  . Red Dye Rash    Current Outpatient Prescriptions  Medication Sig Dispense Refill  . amitriptyline  (ELAVIL) 50 MG tablet Take 1-2 tablets (50-100 mg total) by mouth at bedtime as needed for sleep. 180 tablet 3  . aspirin EC 81 MG tablet Take 81 mg by mouth daily.    . Carboxymethylcellul-Glycerin (OPTIVE) 0.5-0.9 % SOLN Apply 1 drop to eye 2 (two) times daily as needed (for dry eyes).    . CRESTOR 10 MG tablet TAKE 1 TABLET BY MOUTH EVERY DAY 30 tablet 10  . diclofenac sodium (VOLTAREN) 1 % GEL Apply topically. Apply 2-4 Grams to affected area twice a day    . estradiol (CLIMARA - DOSED IN MG/24 HR) 0.1 mg/24hr patch Place 1 patch (0.1 mg total) onto the skin once a week. 12 patch 3  . fluconazole (DIFLUCAN) 150 MG tablet Take 1 tablet (150 mg total) by mouth once a week. 3 tablet 1  . fluocinonide cream (LIDEX) 0.05 % APPLY TOPICALLY DAILY AS NEEDED 30 g 5  . furosemide (LASIX) 20 MG tablet Take 1 tablet (20 mg total) by mouth daily. 90 tablet 3  . glipiZIDE (GLUCOTROL) 2.5 mg TABS tablet Take 0.5 tablets (2.5 mg total) by mouth daily before breakfast. 60 tablet 2  . Hypromellose (GENTEAL OP) Apply 1 application to eye at bedtime.    Marland Kitchen KLOR-CON 10 10 MEQ tablet Take 10 mEq by mouth daily.    Marland Kitchen levothyroxine (SYNTHROID, LEVOTHROID) 100 MCG tablet Take one tablet by mouth daily except on Monday and Wednesday take 1/2 tablet 90 tablet 3  . losartan (COZAAR) 100 MG tablet Take 1 tablet (100 mg total) by mouth daily. To replace lisinopril rx 90 tablet 3  . magnesium oxide (MAG-OX) 400 (  241.3 MG) MG tablet Take 1 tablet (400 mg total) by mouth daily. 30 tablet 2  . Multiple Vitamins-Minerals (PRESERVISION AREDS 2) CAPS Take 2 capsules by mouth daily.    . nitroGLYCERIN (NITROSTAT) 0.4 MG SL tablet Place 0.4 mg under the tongue every 5 (five) minutes as needed for chest pain.     Marland Kitchen omeprazole (PRILOSEC OTC) 20 MG tablet Take 20 mg by mouth daily.    Vladimir Faster Glycol-Propyl Glycol (SYSTANE OP) Apply 1 drop to eye at bedtime.    . triamcinolone cream (KENALOG) 0.1 % Apply 1 application topically 2  (two) times daily. (Patient taking differently: Apply 1 application topically as needed. ) 30 g 0   No current facility-administered medications for this visit.    Past Medical History  Diagnosis Date  . Hyperlipidemia   . Diverticulosis of colon 06/2005  . Hypothyroid   . Insomnia   . Menopausal symptoms   . Hypertension   . Hyperglycemia   . adenomatous Colon polyps     colonoscopy 8/99, 12/02, 8/06  . Pericarditis 1980's  . Chest pain     uses NTG as needed  . Depression   . Bell's palsy   . GERD (gastroesophageal reflux disease)   . H/O hiatal hernia   . Headache(784.0)   . Arthritis   . Irritable bowel   . Diabetes mellitus without complication   . TIA (transient ischemic attack)     09/2014    Past Surgical History  Procedure Laterality Date  . Partial hysterectomy      ovaries intact  . Cholecystectomy  1990  . Ectopic pregnancy surgery  1971  . Cystectomy  05/04/08    left lower arm, benign  . Tonsillectomy    . Appendectomy    . Cardiac catheterization    . Fracture surgery      nose  . Tubal ligation    . Orif ankle fracture Left 10/23/2013    Procedure: OPEN REDUCTION INTERNAL FIXATION (ORIF) LEFT ANKLE FRACTURE;  Surgeon: Marianna Payment, MD;  Location: [facility];  Service: Orthopedics;  Laterality: Left;    Family History  Problem Relation Age of Onset  . Cancer Father     stomach   . Heart disease Son     MI  . Arthritis Mother   . Diabetes Mother   . Colon cancer Neg Hx   . Pancreatic cancer Neg Hx   . Stomach cancer Neg Hx   . Breast cancer Neg Hx     History   Social History  . Marital Status: Widowed    Spouse Name: [name]    Number of Children: 3  . Years of Education: [name]   Occupational History  . retired, previous Engineer, manufacturing systems    Social History Main Topics  . Smoking status: Former Research scientist (life sciences)  . Smokeless tobacco: Former Systems developer  . Alcohol Use: No  . Drug Use: No  . Sexual Activity: Not on file   Other Topics Concern  . Not on  file   Social History Narrative   From [place].   3 children, one died from MI in 03-27-2005   Married 6   Husband and brother died in the same week in Nov 26, 2012    Review of systems: The patient specifically denies any chest pain at rest or with exertion, dyspnea at rest or with exertion, orthopnea, paroxysmal nocturnal dyspnea, syncope, palpitations, intermittent claudication, lower extremity edema, unexplained weight gain, cough, hemoptysis or wheezing.  The  patient also denies abdominal pain, nausea, vomiting, dysphagia, diarrhea, constipation, polyuria, polydipsia, dysuria, hematuria, frequency, urgency, abnormal bleeding or bruising, fever, chills, unexpected weight changes, mood swings, change in skin or hair texture, change in voice quality, auditory or visual problems, allergic reactions or rashes, new musculoskeletal complaints other than usual "aches and pains".   PHYSICAL EXAM BP 114/70 mmHg  Pulse 90  Ht 5\' 3"  (1.6 m)  Wt 188 lb 3.2 oz (85.367 kg)  BMI 33.35 kg/m2  General: Alert, oriented x3, no distress Head: no evidence of trauma, PERRL, EOMI, no exophtalmos or lid lag, no myxedema, no xanthelasma; normal ears, nose and oropharynx Neck: normal jugular venous pulsations and no hepatojugular reflux; brisk carotid pulses without delay and no carotid bruits Chest: clear to auscultation, no signs of consolidation by percussion or palpation, normal fremitus, symmetrical and full respiratory excursions Cardiovascular: normal position and quality of the apical impulse, regular rhythm, normal first and second heart sounds, no murmurs, rubs or gallops Abdomen: no tenderness or distention, no masses by palpation, no abnormal pulsatility or arterial bruits, normal bowel sounds, no hepatosplenomegaly Extremities: no clubbing, cyanosis or edema; 2+ radial, ulnar and brachial pulses bilaterally; 2+ right femoral, posterior tibial and dorsalis pedis pulses; 2+ left femoral, posterior  tibial and dorsalis pedis pulses; no subclavian or femoral bruits Neurological: grossly nonfocal   EKG:  normal sinus rhythm with inferolateral ST-T changes. Maybe LVH with voltage masked by obesity.  Lipid panel     Component Value Date/Time   CHOL 120 09/29/2014 0538   TRIG 110 09/29/2014 0538   TRIG 203 10/03/2012   HDL 56 09/29/2014 0538   CHOLHDL 2.1 09/29/2014 0538   VLDL 22 09/29/2014 0538   LDLCALC 42 09/29/2014 0538   LDLDIRECT 70.0 06/27/2014 0754    BMET    Component Value Date/Time   NA 142 09/28/2014 1751   K 3.6* 09/28/2014 1751   CL 102 09/28/2014 1751   CO2 28 09/28/2014 1751   GLUCOSE 81 09/28/2014 1751   GLUCOSE 113* 12/11/2006 1253   BUN 8 09/28/2014 1751   CREATININE 0.67 09/28/2014 1751   CREATININE 0.79 10/08/2012 1750   CALCIUM 9.3 09/28/2014 1751   GFRNONAA 86* 09/28/2014 1751   GFRAA >90 09/28/2014 1751     ASSESSMENT AND PLAN  Mrs. Thorley has recurrent and stereotypical neurological complaints that cannot be embolic in etiology. Possibilities include recurrent TIAs secondary to intracranial arterial disease or nonvascular phenomena such as atypical seizures.  Will perform her echocardiogram to clarify whether or not she has left ventricular hypertrophy as an explanation for her resting ECG abnormalities. We have not found any evidence of atrial fibrillation by monitoring and the pattern of neurological complaints does not support a cardioembolic event.  Her current blood pressure control is excellent and she should continue taking aspirin.   Have advised neurology follow-up  Orders Placed This Encounter  Procedures  . Ambulatory referral to Neurology  . 2D Echocardiogram without contrast   Meds ordered this encounter  Medications  . KLOR-CON 10 10 MEQ tablet    Sig: Take 10 mEq by mouth daily.    Holli Humbles, MD, [place] [phone] office [phone] pager

## 2014-11-10 NOTE — Patient Instructions (Signed)
Your physician has requested that you have an echocardiogram. Echocardiography is a painless test that uses sound waves to create images of your heart. It provides your doctor with information about the size and shape of your heart and how well your heart's chambers and valves are working. This procedure takes approximately one hour. There are no restrictions for this procedure.  A referral has been placed to be evaluated by a Neurologist for assessment of TIA.  Dr. Sallyanne Kuster recommends that you schedule a follow-up appointment in: One year.

## 2014-11-10 NOTE — Telephone Encounter (Signed)
Pharmacy says they figured it out.  She does have refills remaining.

## 2014-11-28 HISTORY — PX: CATARACT EXTRACTION: SUR2

## 2014-11-28 HISTORY — PX: MASTECTOMY: SHX3

## 2014-12-02 ENCOUNTER — Ambulatory Visit (HOSPITAL_COMMUNITY)
Admission: RE | Admit: 2014-12-02 | Discharge: 2014-12-02 | Disposition: A | Discharge status: Home / Self Care | Payer: Commercial Managed Care - HMO | Source: Ambulatory Visit | Attending: Cardiovascular Disease | Admitting: Cardiovascular Disease

## 2014-12-02 DIAGNOSIS — I369 Nonrheumatic tricuspid valve disorder, unspecified: Secondary | ICD-10-CM

## 2014-12-02 DIAGNOSIS — G458 Other transient cerebral ischemic attacks and related syndromes: Secondary | ICD-10-CM | POA: Diagnosis not present

## 2014-12-02 NOTE — Progress Notes (Signed)
2D Echo Performed 12/02/2014    Marygrace Drought, RCS

## 2014-12-05 DIAGNOSIS — M1711 Unilateral primary osteoarthritis, right knee: Secondary | ICD-10-CM | POA: Diagnosis not present

## 2014-12-07 ENCOUNTER — Other Ambulatory Visit: Payer: Self-pay | Admitting: Family Medicine

## 2014-12-08 ENCOUNTER — Telehealth: Payer: Self-pay | Admitting: Family Medicine

## 2014-12-08 NOTE — Telephone Encounter (Signed)
Electronic refill request.  Last Filled:   02/2014.  Contacted the patient to see why she needs ABX.  She says she has an infection.  I asked what kind of infection...sinus, etc and she said "yes, sinus infection".  I explained that ABX are usually not prescribed without an office visit and she says she has an OV scheduled next week.  I offered to schedule the OV sooner and she said she'd keep the original appointment and use yogurt to see if that would help.

## 2014-12-08 NOTE — Telephone Encounter (Signed)
Pt said [facility] does not have available refills losartan 100mg . Spoke with Shannie at [facility] and losartan is on hold and will get rx ready for pick up. Pt voiced understanding and will [facility] at [facility].

## 2014-12-08 NOTE — Telephone Encounter (Signed)
I wouldn't send it in w/o having her seen first, either with moved up OV or check next week.

## 2014-12-11 ENCOUNTER — Ambulatory Visit (INDEPENDENT_AMBULATORY_CARE_PROVIDER_SITE_OTHER): Payer: Commercial Managed Care - HMO | Admitting: Neurology

## 2014-12-11 ENCOUNTER — Encounter: Payer: Self-pay | Admitting: Neurology

## 2014-12-11 VITALS — BP 139/81 | HR 100 | Ht 65.75 in | Wt 191.6 lb

## 2014-12-11 DIAGNOSIS — E1159 Type 2 diabetes mellitus with other circulatory complications: Secondary | ICD-10-CM

## 2014-12-11 DIAGNOSIS — R200 Anesthesia of skin: Secondary | ICD-10-CM

## 2014-12-11 DIAGNOSIS — E785 Hyperlipidemia, unspecified: Secondary | ICD-10-CM | POA: Diagnosis not present

## 2014-12-11 DIAGNOSIS — I10 Essential (primary) hypertension: Secondary | ICD-10-CM | POA: Diagnosis not present

## 2014-12-11 DIAGNOSIS — Z8639 Personal history of other endocrine, nutritional and metabolic disease: Secondary | ICD-10-CM | POA: Insufficient documentation

## 2014-12-11 NOTE — Patient Instructions (Signed)
-   continue ASA and crestor for stroke prevention - check BP at home - you need to discuss with Dr. Damita Dunnings regarding glucose monitoring device at home and also discuss about estrogen patch - will do EEG to rule out seizure - Follow up with your primary care physician for stroke risk factor modification. Recommend maintain blood pressure goal <130/80, diabetes with hemoglobin A1c goal below 6.5% and lipids with LDL cholesterol goal below 70 mg/dL.  - follow up with cardiology as scheduled - follow up in 2 months.

## 2014-12-11 NOTE — Progress Notes (Signed)
NEUROLOGY CLINIC NEW PATIENT NOTE  NAME: Kelly Kennedy DOB: 1941-03-16 REFERRING PHYSICIAN: [name], MD  I saw Kelly Kennedy as a new consult in the neurovascular clinic today regarding  Chief Complaint  Patient presents with  . New Evaluation    RM 2  . Transient Ischemic Attack  .  HPI: Kelly Kennedy is a 74 y.o. female with PMH of HTN, DM, HLD who presents as a new patient for recurrent left sided weakness and numbness.   Pt was admitted to [facility] on 09/28/14 for episodic exertional chest pain and palpitations. She also complained of headache and episode of left UE and LE numbness and weakness. Her ECG is abnormal and there are subtle dynamic changes since yesterday - more obvious ST depression and T wave inversion inferolaterally. Borderline long QT. After admission, she had nuclear stress test on 09/29/2014 that was completely normal. EF was 62%. Cardiology suspect afib and recommended 30 day monitoring which done as outpt did not see any episodes of afib. Her outpt 2D echo showed EF 50-55%. A1C 6.9 and LDL 42.   She also underwent TIA work up. MRI and MRA negative. CUS Right: 1-39% ICA stenosis. Vertebral artery flow is antegrade. Left: 40-59% ICA stenosis. She has estradil patch for hot flashes. She was put on ASA and crestor on discharge.   She followed up with Dr. Sallyanne Kuster and concerning for TIA vs. Seizure due to recurrent left UE and LE numbness and weakness. By further questioning, she stated that she had both LE weakness since 09/2013 when she had fall with LE fracture. However, on 09/28/14, she had bad headache along with chest pain/palpitations as well as left UE and LE weakness and numbness, lasted about 3 hours and resolved. This was the first time she has it. Since then, she has possible 3 times a week left sided numbness and weakness but not as bad as the first one, lasting 10-78min and gone. This episodes seems not associated with HA temporally.    However, she does have HA for the last 2 years after she lost her husband. Initially the headache almost everyday, bilateral behind eyes, she contributes to sinus HA, 8/10, lasting all day, mild photophobia but no N/V, BC powder and sleeping helps. Later the HA goes twice a week and triggers are stress and depression. She did have bad headache prior admission on 09/28/14 along with left UE and LE numbness weakness, but other times the HA and hemiparesis/hemiparesthesia are basically separated from each other.  She has hx of HTN on lasix, cozaar and her BP today 139/81. She denies DM but her A1C was 6.9 and she is on glipizide and she did not check her glucose at home.   She is using estradil patches for hot flashes. She said she needs it for hot flashes.  Her son had sudden death at age 24 - autopsy -"massive heart attack". No other family history of premature cardiac or vascular illness. She denies smoking, drinking or illicit drugs.    Past Medical History  Diagnosis Date  . Hyperlipidemia   . Diverticulosis of colon 06/2005  . Hypothyroid   . Insomnia   . Menopausal symptoms   . Hypertension   . Hyperglycemia   . adenomatous Colon polyps     colonoscopy 8/99, 12/02, 8/06  . Pericarditis 1980's  . Chest pain     uses NTG as needed  . Depression   . Bell's palsy   . GERD (gastroesophageal  reflux disease)   . H/O hiatal hernia   . Headache(784.0)   . Arthritis   . Irritable bowel   . Diabetes mellitus without complication   . TIA (transient ischemic attack)     09/2014   Past Surgical History  Procedure Laterality Date  . Partial hysterectomy      ovaries intact  . Cholecystectomy  1990  . Ectopic pregnancy surgery  1971  . Cystectomy  05/04/08    left lower arm, benign  . Tonsillectomy    . Appendectomy    . Cardiac catheterization    . Fracture surgery      nose  . Tubal ligation    . Orif ankle fracture Left 10/23/2013    Procedure: OPEN REDUCTION INTERNAL FIXATION  (ORIF) LEFT ANKLE FRACTURE;  Surgeon: Marianna Payment, MD;  Location: [facility];  Service: Orthopedics;  Laterality: Left;   Family History  Problem Relation Age of Onset  . Cancer Father     stomach   . Heart disease Son     MI  . Arthritis Mother   . Diabetes Mother   . Colon cancer Neg Hx   . Pancreatic cancer Neg Hx   . Stomach cancer Neg Hx   . Breast cancer Neg Hx    Current Outpatient Prescriptions  Medication Sig Dispense Refill  . amitriptyline (ELAVIL) 50 MG tablet Take 1-2 tablets (50-100 mg total) by mouth at bedtime as needed for sleep. 180 tablet 3  . aspirin EC 81 MG tablet Take 81 mg by mouth daily.    . Carboxymethylcellul-Glycerin (OPTIVE) 0.5-0.9 % SOLN Apply 1 drop to eye 2 (two) times daily as needed (for dry eyes).    . CRESTOR 10 MG tablet TAKE 1 TABLET BY MOUTH EVERY DAY 30 tablet 10  . diclofenac sodium (VOLTAREN) 1 % GEL Apply topically. Apply 2-4 Grams to affected area twice a day    . estradiol (CLIMARA - DOSED IN MG/24 HR) 0.1 mg/24hr patch Place 1 patch (0.1 mg total) onto the skin once a week. 12 patch 3  . fluconazole (DIFLUCAN) 150 MG tablet Take 1 tablet (150 mg total) by mouth once a week. 3 tablet 1  . fluocinonide cream (LIDEX) 0.05 % APPLY TOPICALLY DAILY AS NEEDED 30 g 5  . furosemide (LASIX) 20 MG tablet Take 1 tablet (20 mg total) by mouth daily. 90 tablet 3  . glipiZIDE (GLUCOTROL) 2.5 mg TABS tablet Take 0.5 tablets (2.5 mg total) by mouth daily before breakfast. 60 tablet 2  . Hypromellose (GENTEAL OP) Apply 1 application to eye at bedtime.    Marland Kitchen KLOR-CON 10 10 MEQ tablet Take 10 mEq by mouth daily.    Marland Kitchen levothyroxine (SYNTHROID, LEVOTHROID) 100 MCG tablet Take one tablet by mouth daily except on Monday and Wednesday take 1/2 tablet 90 tablet 3  . losartan (COZAAR) 100 MG tablet Take 1 tablet (100 mg total) by mouth daily. To replace lisinopril rx 90 tablet 3  . magnesium oxide (MAG-OX) 400 (241.3 MG) MG tablet Take 1 tablet (400 mg total) by  mouth daily. 30 tablet 2  . Multiple Vitamins-Minerals (PRESERVISION AREDS 2) CAPS Take 2 capsules by mouth daily.    . nitroGLYCERIN (NITROSTAT) 0.4 MG SL tablet Place 0.4 mg under the tongue every 5 (five) minutes as needed for chest pain.     Marland Kitchen omeprazole (PRILOSEC OTC) 20 MG tablet Take 20 mg by mouth daily.    Kelly Faster Glycol-Propyl Glycol (SYSTANE OP) Apply 1  drop to eye at bedtime.    . triamcinolone cream (KENALOG) 0.1 % Apply 1 application topically 2 (two) times daily. (Patient taking differently: Apply 1 application topically as needed. ) 30 g 0   No current facility-administered medications for this visit.   Allergies  Allergen Reactions  . Niacin Rash  . Red Dye Rash   History   Social History  . Marital Status: Widowed    Spouse Name: [name]    Number of Children: 3  . Years of Education: [name]   Occupational History  . retired, previous Engineer, manufacturing systems    Social History Main Topics  . Smoking status: Former Research scientist (life sciences)  . Smokeless tobacco: Former Systems developer  . Alcohol Use: No  . Drug Use: No  . Sexual Activity: Not on file   Other Topics Concern  . Not on file   Social History Narrative   From [place].   3 children, one died from MI in 03-10-2005   Married 1959-03-11   Husband and brother died in the same week in 12-10-2012         Patient is right handed.   Patient has hs education.   Patient drinks 1 cup of soda daily, and green tea.    Review of Systems Full 14 system review of systems performed and notable only for those listed, all others are neg:  Constitutional: weight gain  Cardiovascular: swelling in legs  Ear/Nose/Throat: [name]  Skin: [name]  Eyes: blurry vision  Respiratory: [name]  Gastroitestinal: [name]  Hematology/Lymphatic: [name]  Endocrine: [name]  Musculoskeletal: joint pain, joint swelling  Allergy/Immunology: [name]  Neurological: memory loss, confusion, HA, numbness, weakness, slurred speech, dizziness  Psychiatric: depression   Physical Exam  Filed  Vitals:   12/11/14 1052  BP: 139/81  Pulse: 100    General - Well nourished, well developed, in no apparent distress.  Ophthalmologic - Sharp disc margins OU.  Cardiovascular - Regular rate and rhythm with no murmur. Carotid pulses were 2+ without bruits .   Neck - supple, no nuchal rigidity .  Mental Status -  Level of arousal and orientation to time, place, and person were intact. Language including expression, naming, repetition, comprehension was assessed and found intact. Attention span and concentration were impaired and not able to do backward spelling. Memory test 3/3 registration and 2/3 delayed recall.  Fund of Knowledge was assessed and was intact.  Cranial Nerves II - XII - II - Visual field intact OU. III, IV, VI - Extraocular movements intact. V - Facial sensation intact bilaterally. VII - Facial movement intact bilaterally. VIII - Hearing & vestibular intact bilaterally. X - Palate elevates symmetrically. XI - Chin turning & shoulder shrug intact bilaterally. XII - Tongue protrusion intact.  Motor Strength - The patient's strength was normal in all extremities and pronator drift was absent.  Bulk was normal and fasciculations were absent.   Motor Tone - Muscle tone was assessed at the neck and appendages and was normal.  Reflexes - The patient's reflexes were normal in all extremities and she had no pathological reflexes.  Sensory - Light touch, temperature/pinprick and Romberg testing were assessed and were normal.    Coordination - The patient had normal movements in the hands and feet with no ataxia or dysmetria.  Tremor was absent.  Gait and Station - The patient's transfers, posture, gait, station, and turns were observed as normal.   Imaging MRI and MRA head  1. No acute intracranial abnormality. 2. Mild chronic small  vessel ischemic disease. 3. No evidence of major intracranial arterial occlusion or significant proximal stenosis.  2D echo -  Left ventricle: The cavity size was normal. Wall thickness was normal. Systolic function was normal. The estimated ejection fraction was in the range of 50% to 55%. Wall motion was normal; there were no regional wall motion abnormalities. Doppler parameters are consistent with abnormal left ventricular relaxation (grade 1 diastolic dysfunction).  CUS - 1-39% right ICA and 40-59% left ICA stenosis. Moderate right ECA stemnosis. Both vertebral artery flow is antegrade.  Stress test 09/29/14-  Pre-test: no chest pain or SOB With GXT: target HR reached in stage 1, patient hypertensive so CL injected and stage held. Pt continued to exercise for 8 minutes more. No chest pain with exertion. + SOB ECG: LVH changes at baseline, inferolateral ST changes deepened with exertion but no ST elevation seen SBP elevated with exertion to 194/87, so pt not pushed to increase exertion. Final report and images pending.   Treadmill Myoview IMPRESSION: 1. No reversible ischemia or infarction. 2. Normal left ventricular wall motion. 3. Left ventricular ejection fraction 62% 4. Low-risk stress test findings.  Lab Review Component     Latest Ref Rng 06/27/2014 07/04/2014 09/29/2014 10/06/2014  Cholesterol     0 - 200 mg/dL 143  120   Triglycerides     <150 mg/dL 243.0 (H)  110   HDL     >39 mg/dL 52.10  56   VLDL     0 - 40 mg/dL 48.6 (H)  22   Total CHOL/HDL Ratio      3  2.1   NonHDL      90.90     LDL (calc)     0 - 99 mg/dL   42   Hgb A1c MFr Bld     4.6 - 6.5 %  6.9 (H) 6.8 (H) 6.9 (H)  Mean Plasma Glucose     <117 mg/dL   148 (H)   TSH     0.35 - 4.50 uIU/mL 0.65     D-Dimer, Quant     0.00 - 0.48 ug/mL-FEU   0.48      Assessment and Plan:   In summary, Kelly Kennedy is a 74 y.o. female with PMH of HTN, DM, HLD and headache presents as a new pt for recurrent left sided numbness and weakness. She had episode of chest pain, palpitation, headache with left UE and LE  numbness and weakness. Cardiac work up so far negative. Pt did admit that she has several episodes of transient left UE and LE numbness and weakness, lasting 10-40min with or without headache. Stroke work up also negative so far, including MRI and MRA, 2D echo, CUS, 30 day cardiac monitoring and LDL. Her A1C was mildly high at 6.9 and she is on estrogen patch for hot flashes. DDx include TIA, focal seizure and complicated migraine. Will continue ASA and crestor for stroke prevention. EEG to rule out seizure and further control DM as well as alternatives for estrogen patch.   - continue ASA and crestor for stroke prevention - check BP at home - follow up with PCP for glucose meter to check glucose at home - need to discuss with PCP regarding alternatives for estrogen patch, may consider progesterone only meds if hormone therapy necessary. - EEG to rule out seizure - Follow up with your primary care physician for stroke risk factor modification. Recommend maintain blood pressure goal <130/80, diabetes with hemoglobin A1c goal below  6.5% and lipids with LDL cholesterol goal below 70 mg/dL.  - I counseled the patient on measures to reduce stroke risk, including the importance of medication compliance, risk factor control, exercise, healthy diet, and avoidance of smoking.  I reviewed stroke warning signs and symptoms and appropriate actions to take if such occurs.  - RTC in 2 months.  Thank you very much for the opportunity to participate in the care of this patient.  Please do not hesitate to call if any questions or concerns arise.  Orders Placed This Encounter  Procedures  . EEG adult    Standing Status: Future     Number of Occurrences:      Standing Expiration Date: 12/12/2015    No orders of the defined types were placed in this encounter.    Patient Instructions  - continue ASA and crestor for stroke prevention - check BP at home - you need to discuss with Dr. Damita Dunnings regarding glucose  monitoring device at home and also discuss about estrogen patch - will do EEG to rule out seizure - Follow up with your primary care physician for stroke risk factor modification. Recommend maintain blood pressure goal <130/80, diabetes with hemoglobin A1c goal below 6.5% and lipids with LDL cholesterol goal below 70 mg/dL.  - follow up with cardiology as scheduled - follow up in 2 months.    Rosalin Hawking, MD PhD [facility] [address] [place], [place] [address] [phone]

## 2014-12-11 NOTE — Procedures (Signed)
      History: Kelly Kennedy is a 74 year old patient with a history of episodes of left-sided numbness that comes and goes. The patient is being evaluated for these events. The episodes may last up to 3 hours with full resolution.  This is a routine EEG area no skull defects are noted. Medications include amitriptyline, aspirin, Crestor, diclofenac, Diflucan, Lasix, glipizide, potassium supplementation, Synthroid, losartan, magnesium supplementation, multivitamins, and Prilosec.  EEG classification: Dysrhythmia grade 2 left frontotemporal  Description of the recording: The background rhythms of this recording consists of a well modulated medium amplitude alpha rhythm of 8 Hz that is reactive to eye opening and closure. As the record progresses, photic stimulation is performed, and this results in a minimal but bilateral photic driving response. Hyperventilation was not performed. As the record continues, intermittent theta slowing is seen in the left frontotemporal area, occasionally associated with sharp transients. At no time during the recording does there appear to be evidence of spike or spike-wave discharges. EKG monitor shows no evidence of cardiac rhythm abnormalities with a heart rate of 90.  Impression: This is an abnormal EEG recording secondary to intermittent dysrhythmic theta activity and sharp transients emanating from the left frontotemporal area. This would suggest a lowered seizure threshold with a left brain focus. This recording does not correlate well with the clinical events, however. Clinical correlation is required.

## 2014-12-18 ENCOUNTER — Telehealth: Payer: Self-pay | Admitting: Neurology

## 2014-12-18 NOTE — Telephone Encounter (Signed)
Called pt to deliver the EEG result. She was informed that the EEG showed some intermittent slow and sharp transients from left brain but not right brain. This is not correlating to her symptoms. She stated that she still has intermittent left side (mainly arm and foot) numbness, rarely weakness. For the last 4-5 days, she had two episodes but she said they were much milder than before. She still has intermittent mild headache and response to [facility] powders. She stated the headache and left side numbness are largely separated each other.   At this time, I do not have good explanation for her left arm and foot numbness episodes. Due to the EEG abnormalities, I offered her to try some AEDs to see if it helps her numbness episode. However, she declined and she would like to continue to watch for her episodes since they are getting better. She will call us if the symptoms getting worse. I will also call her next week to follow up on her. She expressed understanding and appreciation.  Rosalin Hawking, MD PhD Stroke Neurology 12/18/2014 3:38 PM

## 2014-12-20 NOTE — Telephone Encounter (Signed)
Noted. Thanks.

## 2014-12-27 ENCOUNTER — Other Ambulatory Visit: Payer: Self-pay | Admitting: Internal Medicine

## 2014-12-28 ENCOUNTER — Other Ambulatory Visit: Payer: Self-pay | Admitting: Internal Medicine

## 2015-01-01 ENCOUNTER — Encounter: Payer: Self-pay | Admitting: Internal Medicine

## 2015-01-01 ENCOUNTER — Other Ambulatory Visit: Payer: Self-pay | Admitting: *Deleted

## 2015-01-01 ENCOUNTER — Ambulatory Visit (INDEPENDENT_AMBULATORY_CARE_PROVIDER_SITE_OTHER): Payer: Commercial Managed Care - HMO | Admitting: Internal Medicine

## 2015-01-01 VITALS — BP 120/76 | HR 103 | Temp 98.2°F | Wt 190.0 lb

## 2015-01-01 DIAGNOSIS — B379 Candidiasis, unspecified: Secondary | ICD-10-CM

## 2015-01-01 DIAGNOSIS — J0110 Acute frontal sinusitis, unspecified: Secondary | ICD-10-CM

## 2015-01-01 DIAGNOSIS — T3695XA Adverse effect of unspecified systemic antibiotic, initial encounter: Secondary | ICD-10-CM

## 2015-01-01 MED ORDER — FLUCONAZOLE 150 MG PO TABS
150.0000 mg | ORAL_TABLET | ORAL | Status: DC
Start: 2015-01-01 — End: 2016-02-04

## 2015-01-01 MED ORDER — AMOXICILLIN 875 MG PO TABS: 875.0000 mg | ORAL_TABLET | Freq: Two times a day (BID) | ORAL | Status: DC

## 2015-01-01 MED ORDER — MAGNESIUM OXIDE 400 (241.3 MG) MG PO TABS: 400.0000 mg | ORAL_TABLET | Freq: Every day | ORAL | Status: DC

## 2015-01-01 NOTE — Progress Notes (Signed)
HPI  Kelly Kennedy is a 74 yo female presenting to the clinic today with productive cough, sore throat and facial pain x 2 weeks. Sputum is yellow. Throat is hoarse, sore and hurts when she coughs. Denies seeing blood. Feeling lots of pain around eyes and cheeks. Other symptoms include runny nose at night and headache. Headache x 1 week;  head really hurts when she coughs. Tried gargling with salt and warm water but saw no improvement. No SOB, dizziness or nausea. She has no history of allergies. She has had sick contacts.  Review of Systems  Past Medical History  Diagnosis Date  . Hyperlipidemia   . Diverticulosis of colon 06/2005  . Hypothyroid   . Insomnia   . Menopausal symptoms   . Hypertension   . Hyperglycemia   . adenomatous Colon polyps     colonoscopy 8/99, 12/02, 8/06  . Pericarditis 1980's  . Chest pain     uses NTG as needed  . Depression   . Bell's palsy   . GERD (gastroesophageal reflux disease)   . H/O hiatal hernia   . Headache(784.0)   . Arthritis   . Irritable bowel   . Diabetes mellitus without complication   . TIA (transient ischemic attack)     09/2014    Family History  Problem Relation Age of Onset  . Cancer Father     stomach   . Heart disease Son     MI  . Arthritis Mother   . Diabetes Mother   . Colon cancer Neg Hx   . Pancreatic cancer Neg Hx   . Stomach cancer Neg Hx   . Breast cancer Neg Hx     History   Social History  . Marital Status: Widowed    Spouse Name: [name]    Number of Children: 3  . Years of Education: [name]   Occupational History  . retired, previous Engineer, manufacturing systems    Social History Main Topics  . Smoking status: Former Research scientist (life sciences)  . Smokeless tobacco: Former Systems developer  . Alcohol Use: No  . Drug Use: No  . Sexual Activity: Not on file   Other Topics Concern  . Not on file   Social History Narrative   From [place].   3 children, one died from MI in 03/25/05   Married March 26, 1959   Husband and brother died in the same week in  12-25-2012         Patient is right handed.   Patient has hs education.   Patient drinks 1 cup of soda daily, and green tea.    Allergies  Allergen Reactions  . Niacin Rash  . Red Dye Rash   Constitutional: Positive headache, fatigue and fever. Denies abrupt weight changes.  HEENT:  Positive sore throat, eye pain/pressure, facial pain and nasal congestion. Denies eye redness, ear pain, ringing in the ears, wax buildup, runny nose or bloody nose. Respiratory: Positive productive cough. Pt reports sputum is yellow. Denies difficulty breathing or shortness of breath.  Cardiovascular: Denies chest pain, chest tightness, palpitations or swelling in the hands or feet.   No other specific complaints in a complete review of systems (except as listed in HPI above).  Objective:   BP 120/76 mmHg  Pulse 103  Temp(Src) 98.2 F (36.8 C) (Oral)  Wt 190 lb (86.183 kg)  SpO2 98% Wt Readings from Last 3 Encounters:  01/01/15 190 lb (86.183 kg)  12/11/14 191 lb 9.6 oz (86.909 kg)  11/10/14 188 lb 3.2  oz (85.367 kg)   General: Kelly Kennedy is pleasant, ill-appearing, in NAD. HEENT: Head: normal shape and size;  sinus tenderness noted - frontal sinuses b/l - moderate, maxillary sinuses b/l - mild; Eyes: sclera white, no icterus, conjunctiva pink; Ears: Tm's gray and intact, normal light reflex; Nose: mucosa pink and moist, septum midline; Throat/Mouth: + PND. Mucosa erythematous and moist, no exudate noted, no lesions or ulcerations noted.  Neck: Cervical and submandibular lymphadenopathy b/l. Neck supple, trachea midline. Right protrusion along sternoclavicular notch noted; no reddening or tenderness.  Cardiovascular: Normal rate and rhythm. S1,S2 noted.  No murmur, rubs or gallops noted.  Pulmonary/Chest: Normal effort and positive vesicular breath sounds. No respiratory distress. No wheezes, rales or ronchi noted.     Assessment & Plan:   Acute Frontal Sinusitis:  Get some rest and drink plenty  of water. Flonase 2 sprays each nostril for 3 days and then as needed. eRx amoxicillin 875 MG tablet Fluconazole 150 MG tablet; take if candida infection develops from amoxicillin.  Prominent sternoclavicular notch noted during neck exam:  Likely normal variant F/U with PCP at next visit on 12/09/14 for assessment.  RTC as needed or if symptoms persist.

## 2015-01-01 NOTE — Patient Instructions (Signed)

## 2015-01-01 NOTE — Progress Notes (Signed)
Pre visit review using our clinic review tool, if applicable. No additional management support is needed unless otherwise documented below in the visit note. 

## 2015-01-09 ENCOUNTER — Encounter: Payer: Self-pay | Admitting: Family Medicine

## 2015-01-09 ENCOUNTER — Ambulatory Visit (INDEPENDENT_AMBULATORY_CARE_PROVIDER_SITE_OTHER): Payer: Commercial Managed Care - HMO | Admitting: Family Medicine

## 2015-01-09 VITALS — BP 130/78 | HR 96 | Temp 98.5°F | Wt 191.2 lb

## 2015-01-09 DIAGNOSIS — M25511 Pain in right shoulder: Secondary | ICD-10-CM

## 2015-01-09 DIAGNOSIS — E119 Type 2 diabetes mellitus without complications: Secondary | ICD-10-CM | POA: Diagnosis not present

## 2015-01-09 DIAGNOSIS — G459 Transient cerebral ischemic attack, unspecified: Secondary | ICD-10-CM

## 2015-01-09 LAB — HEMOGLOBIN A1C: Hgb A1c MFr Bld: 6.9 % — ABNORMAL HIGH (ref 4.6–6.5)

## 2015-01-09 MED ORDER — NITROGLYCERIN 0.4 MG SL SUBL: 0.4000 mg | SUBLINGUAL_TABLET | SUBLINGUAL | Status: DC | PRN

## 2015-01-09 NOTE — Progress Notes (Signed)
Pre visit review using our clinic review tool, if applicable. No additional management support is needed unless otherwise documented below in the visit note.  Recent sinus infection, treated with amoxil.  She had a rash, thought to be due to the red dye in the pill, not the med itself.  No h/o PCN allergy.  She has tolerated zmax prev.  She has a h/o needing diflucan with abx.    F/u TIA.  She has f/u with neuro pending for next month.   Due for f/u A1c.  She hadn't been checking sugar at home.  Working on diet.   Still on statin.  Prev with LDL controlled.   HRT d/w pt.  She is trying to taper off.  She is using about 1 patch per month.  D/w pt about HRT and risk for TIA.  She understands and wants to continue to taper as is.   No neuro sx currently.    She continues to have some R shoulder pain, she had prev been dx'd with rotator cuff pathology and has been working on home exercises intermittently.  Pain with ROM overhead.  No trauma.   Meds, vitals, and allergies reviewed.   ROS: See HPI.  Otherwise, noncontributory.  GEN: nad, alert and oriented HEENT: mucous membranes moist NECK: supple w/o LA CV: rrr.  no murmur PULM: ctab, no inc wob ABD: soft, +bs EXT: no edema SKIN: no acute rash  Diabetic foot exam: Normal inspection No skin breakdown No calluses  Normal DP pulses Normal sensation to light touch and monofilament Nails normal

## 2015-01-09 NOTE — Patient Instructions (Addendum)
Go to the lab on the way out.  We'll contact you with your lab report. Keep trying to taper off the patch.  I'll await the follow up appointment from the neuro clinic.  Take care.  Glad to see you.  Recheck in about 4 months.  We can do labs at the visit if needed.

## 2015-01-11 DIAGNOSIS — M25511 Pain in right shoulder: Secondary | ICD-10-CM | POA: Insufficient documentation

## 2015-01-11 NOTE — Assessment & Plan Note (Signed)
Pain on exam with int/ext rotation, pain with supraspinatus testing but no arm drop.  D/w pt about anatomy and she'll continue home exercises, d/w pt.  She agrees.  F/u prn.

## 2015-01-11 NOTE — Assessment & Plan Note (Signed)
The main issue here is secondary prevention, risk reduction.  Due for f/u A1c. She hadn't been checking sugar at home. Working on diet.  See notes on labs.  Still on statin. Prev with LDL controlled.  HRT d/w pt. She is trying to taper off. She is using about 1 patch per month. D/w pt about HRT and risk for TIA. She understands and wants to continue to taper as is.  No neuro sx currently.  Will continue as is for now, no changes at office visit, see notes on labs.  >25 minutes spent in face to face time with patient, >50% spent in counselling or coordination of care.

## 2015-01-12 ENCOUNTER — Encounter: Payer: Self-pay | Admitting: *Deleted

## 2015-01-22 DIAGNOSIS — H40013 Open angle with borderline findings, low risk, bilateral: Secondary | ICD-10-CM | POA: Diagnosis not present

## 2015-01-22 DIAGNOSIS — E119 Type 2 diabetes mellitus without complications: Secondary | ICD-10-CM | POA: Diagnosis not present

## 2015-01-22 DIAGNOSIS — H25813 Combined forms of age-related cataract, bilateral: Secondary | ICD-10-CM | POA: Diagnosis not present

## 2015-02-06 ENCOUNTER — Ambulatory Visit (INDEPENDENT_AMBULATORY_CARE_PROVIDER_SITE_OTHER): Payer: Commercial Managed Care - HMO | Admitting: Family Medicine

## 2015-02-06 ENCOUNTER — Encounter: Payer: Self-pay | Admitting: Family Medicine

## 2015-02-06 VITALS — BP 122/92 | HR 104 | Temp 97.9°F | Wt 192.0 lb

## 2015-02-06 DIAGNOSIS — J0110 Acute frontal sinusitis, unspecified: Secondary | ICD-10-CM | POA: Diagnosis not present

## 2015-02-06 DIAGNOSIS — J0100 Acute maxillary sinusitis, unspecified: Secondary | ICD-10-CM | POA: Diagnosis not present

## 2015-02-06 DIAGNOSIS — S82892A Other fracture of left lower leg, initial encounter for closed fracture: Secondary | ICD-10-CM | POA: Diagnosis not present

## 2015-02-06 MED ORDER — TRAMADOL HCL 50 MG PO TABS: 50.0000 mg | ORAL_TABLET | Freq: Three times a day (TID) | ORAL | Status: DC | PRN

## 2015-02-06 MED ORDER — FLUCONAZOLE 150 MG PO TABS: 150.0000 mg | ORAL_TABLET | ORAL | Status: DC

## 2015-02-06 MED ORDER — AZITHROMYCIN 250 MG PO TABS
ORAL_TABLET | ORAL | Status: DC
Start: 2015-02-06 — End: 2015-05-04

## 2015-02-06 NOTE — Patient Instructions (Addendum)
Start the antibiotics and use diflucan if needed.  Try tramadol if needed for your ankle pain.   Take care.  Glad to see you.

## 2015-02-06 NOTE — Progress Notes (Signed)
Pre visit review using our clinic review tool, if applicable. No additional management support is needed unless otherwise documented below in the visit note.  "sinus trouble" started about 1 week ago.  First noted ST, then post nasal gtt, swollen glands, sneezing.  Has a fever prev.  No vomiting.  L ear pain.  Cough noted today.  Not much facial pain.    Ankle pain.  After ORIF, still in pain.  Asking about options.  Tying to limit nsaids by mouth.  D/w pt.    Meds, vitals, and allergies reviewed.   ROS: See HPI.  Otherwise, noncontributory.  GEN: nad, alert and oriented HEENT: mucous membranes moist, tm w/o erythema, nasal exam w/o erythema, clear discharge noted,  OP with cobblestoning, R max sinus ttp NECK: supple w/o LA CV: rrr.   PULM: ctab, no inc wob EXT: no edema SKIN: no acute rash

## 2015-02-08 DIAGNOSIS — J0100 Acute maxillary sinusitis, unspecified: Secondary | ICD-10-CM | POA: Insufficient documentation

## 2015-02-08 NOTE — Assessment & Plan Note (Signed)
Given her hx, okay to try prn tramadol and f/u prn.  She agrees. Routine cautions given.

## 2015-02-08 NOTE — Assessment & Plan Note (Signed)
Nontoxic, use azithro.  Can use diflucan if needed if she gets a yeast infection.  F/u prn.  Supportive care o/w.  She agrees.

## 2015-02-17 ENCOUNTER — Ambulatory Visit: Payer: Commercial Managed Care - HMO | Admitting: Neurology

## 2015-02-18 ENCOUNTER — Other Ambulatory Visit: Payer: Self-pay

## 2015-02-18 ENCOUNTER — Encounter: Payer: Self-pay | Admitting: Neurology

## 2015-02-18 DIAGNOSIS — Z1231 Encounter for screening mammogram for malignant neoplasm of breast: Secondary | ICD-10-CM

## 2015-02-19 ENCOUNTER — Other Ambulatory Visit: Payer: Self-pay | Admitting: Family Medicine

## 2015-02-19 NOTE — Telephone Encounter (Signed)
Electronic refill request. Last Filled:    3 tablet 1 02/06/2015  Please advise.

## 2015-02-19 NOTE — Telephone Encounter (Signed)
She should still have a refill on this if needed.  Thanks.

## 2015-02-19 NOTE — Telephone Encounter (Signed)
Spoke to patient, I informed her that she is trying to fill the medication to early. She should not take another pill until 02/27/15, if she is still having symptoms she  Is to get her refill at that time.

## 2015-03-02 DIAGNOSIS — H2513 Age-related nuclear cataract, bilateral: Secondary | ICD-10-CM | POA: Diagnosis not present

## 2015-03-02 DIAGNOSIS — H268 Other specified cataract: Secondary | ICD-10-CM | POA: Diagnosis not present

## 2015-03-02 DIAGNOSIS — H2512 Age-related nuclear cataract, left eye: Secondary | ICD-10-CM | POA: Diagnosis not present

## 2015-03-06 ENCOUNTER — Telehealth: Payer: Self-pay

## 2015-03-06 NOTE — Telephone Encounter (Signed)
Pt request medical records to go to eye doctor; pt will come to office to sign record release.

## 2015-03-18 ENCOUNTER — Ambulatory Visit (INDEPENDENT_AMBULATORY_CARE_PROVIDER_SITE_OTHER): Payer: Commercial Managed Care - HMO | Admitting: Neurology

## 2015-03-18 ENCOUNTER — Encounter: Payer: Self-pay | Admitting: Neurology

## 2015-03-18 VITALS — BP 170/94 | HR 92 | Ht 63.0 in | Wt 192.8 lb

## 2015-03-18 DIAGNOSIS — R200 Anesthesia of skin: Secondary | ICD-10-CM | POA: Diagnosis not present

## 2015-03-18 DIAGNOSIS — E1159 Type 2 diabetes mellitus with other circulatory complications: Secondary | ICD-10-CM

## 2015-03-18 DIAGNOSIS — I10 Essential (primary) hypertension: Secondary | ICD-10-CM | POA: Diagnosis not present

## 2015-03-18 DIAGNOSIS — E785 Hyperlipidemia, unspecified: Secondary | ICD-10-CM | POA: Diagnosis not present

## 2015-03-18 NOTE — Progress Notes (Signed)
NEUROLOGY CLINIC FOLLOW UP NOTE  NAME: Kelly Kennedy DOB: 10-11-1941  HPI: Kelly Kennedy is a 74 y.o. female with PMH of HTN, DM, HLD who presents as a new patient for recurrent left sided weakness and numbness.   Pt was admitted to [facility] on 09/28/14 for episodic exertional chest pain and palpitations. She also complained of headache and episode of left UE and LE numbness and weakness. Her ECG is abnormal and there are subtle dynamic changes since yesterday - more obvious ST depression and T wave inversion inferolaterally. Borderline long QT. After admission, she had nuclear stress test on 09/29/2014 that was completely normal. EF was 62%. Cardiology suspect afib and recommended 30 day monitoring which done as outpt did not see any episodes of afib. Her outpt 2D echo showed EF 50-55%. A1C 6.9 and LDL 42.   She also underwent TIA work up. MRI and MRA negative. CUS Right: 1-39% ICA stenosis. Vertebral artery flow is antegrade. Left: 40-59% ICA stenosis. She has estradil patch for hot flashes. She was put on ASA and crestor on discharge.   She followed up with Dr. Sallyanne Kuster and concerning for TIA vs. Seizure due to recurrent left UE and LE numbness and weakness. By further questioning, she stated that she had both LE weakness since 09/2013 when she had fall with LE fracture. However, on 09/28/14, she had bad headache along with chest pain/palpitations as well as left UE and LE weakness and numbness, lasted about 3 hours and resolved. This was the first time she has it. Since then, she has possible 3 times a week left sided numbness and weakness but not as bad as the first one, lasting 10-24min and gone. This episodes seems not associated with HA temporally.   However, she does have HA for the last 2 years after she lost her husband. Initially the headache almost everyday, bilateral behind eyes, she contributes to sinus HA, 8/10, lasting all day, mild photophobia but no N/V, BC powder and sleeping  helps. Later the HA goes twice a week and triggers are stress and depression. She did have bad headache prior admission on 09/28/14 along with left UE and LE numbness weakness, but other times the HA and hemiparesis/hemiparesthesia are basically separated from each other.  She has hx of HTN on lasix, cozaar and her BP today 139/81. She denies DM but her A1C was 6.9 and she is on glipizide and she did not check her glucose at home.   She is using estradil patches for hot flashes. She said she needs it for hot flashes.  Her son had sudden death at age 38 - autopsy -"massive heart attack". No other family history of premature cardiac or vascular illness. She denies smoking, drinking or illicit drugs.   Interval history: During the interval time, pt has been doing better. She stated that her left side numbness episode now only 3-4 per month, down significantly from 3-4 per week before. Her previous headache also resolved but she had some daily mild headache due to diclofenac eye drop, but gradually better now. She had EEG done here in 11/2014 showed left frontotemporal area, which is not correlating to her symptoms. Will need EEG repeat. Her BP today is high 170/94 in clinic, she does not check BP or glucose at home.   Past Medical History  Diagnosis Date  . Hyperlipidemia   . Diverticulosis of colon 06/2005  . Hypothyroid   . Insomnia   . Menopausal symptoms   . Hypertension   .  Hyperglycemia   . adenomatous Colon polyps     colonoscopy 8/99, 12/02, 8/06  . Pericarditis 1980's  . Chest pain     uses NTG as needed  . Depression   . Bell's palsy   . GERD (gastroesophageal reflux disease)   . H/O hiatal hernia   . Headache(784.0)   . Arthritis   . Irritable bowel   . Diabetes mellitus without complication   . TIA (transient ischemic attack)     09/2014   Past Surgical History  Procedure Laterality Date  . Partial hysterectomy      ovaries intact  . Cholecystectomy  1990  . Ectopic  pregnancy surgery  1971  . Cystectomy  05/04/08    left lower arm, benign  . Tonsillectomy    . Appendectomy    . Cardiac catheterization    . Fracture surgery      nose  . Tubal ligation    . Orif ankle fracture Left 10/23/2013    Procedure: OPEN REDUCTION INTERNAL FIXATION (ORIF) LEFT ANKLE FRACTURE;  Surgeon: Marianna Payment, MD;  Location: [facility];  Service: Orthopedics;  Laterality: Left;   Family History  Problem Relation Age of Onset  . Cancer Father     stomach   . Heart disease Son     MI  . Arthritis Mother   . Diabetes Mother   . Colon cancer Neg Hx   . Pancreatic cancer Neg Hx   . Stomach cancer Neg Hx   . Breast cancer Neg Hx    Current Outpatient Prescriptions  Medication Sig Dispense Refill  . amitriptyline (ELAVIL) 50 MG tablet Take 1-2 tablets (50-100 mg total) by mouth at bedtime as needed for sleep. 180 tablet 3  . aspirin EC 81 MG tablet Take 81 mg by mouth daily.    Marland Kitchen azithromycin (ZITHROMAX) 250 MG tablet 2 tabs a day for 1 day and then 1 a day for 4 days. 6 tablet 0  . Carboxymethylcellul-Glycerin (OPTIVE) 0.5-0.9 % SOLN Apply 1 drop to eye 2 (two) times daily as needed (for dry eyes).    . COMBIGAN 0.2-0.5 % ophthalmic solution 1 drop 3 (three) times daily.     . CRESTOR 10 MG tablet TAKE 1 TABLET BY MOUTH EVERY DAY 30 tablet 10  . diclofenac sodium (VOLTAREN) 1 % GEL Apply topically. Apply 2-4 Grams to affected area twice a day    . DUREZOL 0.05 % EMUL Use as directed until finished.  (for one month).    Marland Kitchen estradiol (CLIMARA - DOSED IN MG/24 HR) 0.1 mg/24hr patch Place 1 patch (0.1 mg total) onto the skin once a week. 12 patch 3  . fluconazole (DIFLUCAN) 150 MG tablet Take 1 tablet (150 mg total) by mouth once a week. 3 tablet 1  . fluocinonide cream (LIDEX) 0.05 % APPLY TOPICALLY DAILY AS NEEDED 30 g 5  . furosemide (LASIX) 20 MG tablet Take 1 tablet (20 mg total) by mouth daily. 90 tablet 3  . glipiZIDE (GLUCOTROL) 2.5 mg TABS tablet Take 0.5 tablets  (2.5 mg total) by mouth daily before breakfast. 60 tablet 2  . Hypromellose (GENTEAL OP) Apply 1 application to eye at bedtime.    Marland Kitchen levothyroxine (SYNTHROID, LEVOTHROID) 100 MCG tablet Take one tablet by mouth daily except on Monday and Wednesday take 1/2 tablet 90 tablet 3  . losartan (COZAAR) 100 MG tablet Take 1 tablet (100 mg total) by mouth daily. To replace lisinopril rx 90 tablet 3  .  magnesium oxide (MAG-OX) 400 (241.3 MG) MG tablet Take 1 tablet (400 mg total) by mouth daily. 30 tablet 2  . Multiple Vitamins-Minerals (PRESERVISION AREDS 2) CAPS Take 2 capsules by mouth daily.    . nitroGLYCERIN (NITROSTAT) 0.4 MG SL tablet Place 1 tablet (0.4 mg total) under the tongue every 5 (five) minutes as needed for chest pain. 25 tablet 12  . omeprazole (PRILOSEC OTC) 20 MG tablet Take 20 mg by mouth daily.    Vladimir Faster Glycol-Propyl Glycol (SYSTANE OP) Apply 1 drop to eye at bedtime.    . traMADol (ULTRAM) 50 MG tablet Take 1 tablet (50 mg total) by mouth every 8 (eight) hours as needed. 30 tablet 0  . triamcinolone cream (KENALOG) 0.1 % Apply 1 application topically 2 (two) times daily. (Patient taking differently: Apply 1 application topically as needed. ) 30 g 0   No current facility-administered medications for this visit.   Allergies  Allergen Reactions  . Niacin Rash  . Red Dye Rash   History   Social History  . Marital Status: Widowed    Spouse Name: [name]  . Number of Children: 3  . Years of Education: [name]   Occupational History  . retired, previous Engineer, manufacturing systems    Social History Main Topics  . Smoking status: Former Research scientist (life sciences)  . Smokeless tobacco: Former Systems developer  . Alcohol Use: No  . Drug Use: No  . Sexual Activity: Not on file   Other Topics Concern  . Not on file   Social History Narrative   From [place].   3 children, one died from MI in 03/10/2005   Married March 11, 1959   Husband and brother died in the same week in 12/10/12         Patient is right handed.   Patient  has hs education.   Patient drinks 1 cup of soda daily, and green tea.    Review of Systems Full 14 system review of systems performed and notable only for those listed, all others are neg:  Constitutional:   Cardiovascular:  Leg swelling Ear/Nose/Throat: ringing in ears  Skin: moles  Eyes: blurry vision, eye itching, eye redness  Respiratory: [name]  Gastroitestinal: diarrhea  Hematology/Lymphatic: bruise  Endocrine: excessive eating  Musculoskeletal: back pain  Allergy/Immunology: [name]  Neurological: memory loss, HA, numbness, speech difficulty  Psychiatric: depression   Physical Exam  Filed Vitals:   03/18/15 0800  BP: 170/94  Pulse: 92    General - Well nourished, well developed, in no apparent distress.  Ophthalmologic - Sharp disc margins OU.  Cardiovascular - Regular rate and rhythm with no murmur. Carotid pulses were 2+ without bruits .   Neck - supple, no nuchal rigidity .  Mental Status -  Level of arousal and orientation to time, place, and person were intact. Language including expression, naming, repetition, comprehension was assessed and found intact. Fund of Knowledge was assessed and was intact.  Cranial Nerves II - XII - II - Visual field intact OU. III, IV, VI - Extraocular movements intact. V - Facial sensation intact bilaterally. VII - Facial movement intact bilaterally. VIII - Hearing & vestibular intact bilaterally. X - Palate elevates symmetrically. XI - Chin turning & shoulder shrug intact bilaterally. XII - Tongue protrusion intact.  Motor Strength - The patient's strength was normal in all extremities and pronator drift was absent.  Bulk was normal and fasciculations were absent.   Motor Tone - Muscle tone was assessed at the neck and appendages and  was normal.  Reflexes - The patient's reflexes were normal in all extremities and she had no pathological reflexes.  Sensory - Light touch, temperature/pinprick and Romberg testing were  assessed and were normal.    Coordination - The patient had normal movements in the hands and feet with no ataxia or dysmetria.  Tremor was absent.  Gait and Station - The patient's transfers, posture, gait, station, and turns were observed as normal.   Imaging MRI and MRA head  1. No acute intracranial abnormality. 2. Mild chronic small vessel ischemic disease. 3. No evidence of major intracranial arterial occlusion or significant proximal stenosis.  2D echo - Left ventricle: The cavity size was normal. Wall thickness was normal. Systolic function was normal. The estimated ejection fraction was in the range of 50% to 55%. Wall motion was normal; there were no regional wall motion abnormalities. Doppler parameters are consistent with abnormal left ventricular relaxation (grade 1 diastolic dysfunction).  CUS - 1-39% right ICA and 40-59% left ICA stenosis. Moderate right ECA stemnosis. Both vertebral artery flow is antegrade.  Stress test 09/29/14-  Pre-test: no chest pain or SOB With GXT: target HR reached in stage 1, patient hypertensive so CL injected and stage held. Pt continued to exercise for 8 minutes more. No chest pain with exertion. + SOB ECG: LVH changes at baseline, inferolateral ST changes deepened with exertion but no ST elevation seen SBP elevated with exertion to 194/87, so pt not pushed to increase exertion. Final report and images pending.   Treadmill Myoview IMPRESSION: 1. No reversible ischemia or infarction. 2. Normal left ventricular wall motion. 3. Left ventricular ejection fraction 62% 4. Low-risk stress test findings.  EEG 12/11/14 - This is an abnormal EEG recording secondary to  intermittent dysrhythmic theta activity and sharp transients  emanating from the left frontotemporal area. This would suggest a  lowered seizure threshold with a left brain focus. This recording  does not correlate well with the clinical events, however.    Clinical correlation is required.  Lab Review Component     Latest Ref Rng 06/27/2014 07/04/2014 09/29/2014 10/06/2014  Cholesterol     0 - 200 mg/dL 143  120   Triglycerides     <150 mg/dL 243.0 (H)  110   HDL     >39 mg/dL 52.10  56   VLDL     0 - 40 mg/dL 48.6 (H)  22   Total CHOL/HDL Ratio      3  2.1   NonHDL      90.90     LDL (calc)     0 - 99 mg/dL   42   Hgb A1c MFr Bld     4.6 - 6.5 %  6.9 (H) 6.8 (H) 6.9 (H)  Mean Plasma Glucose     <117 mg/dL   148 (H)   TSH     0.35 - 4.50 uIU/mL 0.65     D-Dimer, Quant     0.00 - 0.48 ug/mL-FEU   0.48      Assessment:   In summary, Aymar L Currey is a 74 y.o. female with PMH of HTN, DM, HLD and headache follow up in clinic for episodic left sided numbness. She had episode of chest pain, palpitation, and headache. Cardiac work up so far negative. Pt also has episodes of transient left UE and LE numbness and weakness, lasting 10-62min with or without headache. Stroke work up also negative so far, including MRI and MRA, 2D echo, CUS,  30 day cardiac monitoring and LDL. Her A1C was mildly high at 6.9 and she is on estrogen patch for hot flashes. DDx include TIA, focal seizure and complicated migraine. Will continue ASA and crestor for stroke prevention. EEG showed left frontotemporal slow and sharp transients which can not relate to her left sided symptoms. Will need repeat EEG later. During interval tim, her episode of left numbness much improved and headache is nearly gone.   Plan: - continue ASA and crestor for stroke prevention - check BP at home - repeat EEG in 6 months on next visit - Follow up with your primary care physician for stroke risk factor modification. Recommend maintain blood pressure goal <130/80, diabetes with hemoglobin A1c goal below 6.5% and lipids with LDL cholesterol goal below 70 mg/dL.   - RTC in 6 months.  Thank you very much for the opportunity to participate in the care of this patient.  Please do not  hesitate to call if any questions or concerns arise.  No orders of the defined types were placed in this encounter.    Meds ordered this encounter  Medications  . DUREZOL 0.05 % EMUL    Sig: Use as directed until finished.  (for one month).  . COMBIGAN 0.2-0.5 % ophthalmic solution    Sig: 1 drop 3 (three) times daily.     Patient Instructions  - continue ASA and crestor for stroke prevention - your EEG showed some left hemisphere slow and transient sharps which did not correlate with your symptoms, will repeat EEG in 6 months - continue amitriptyline for migraine headache prophylaxis - Follow up with your primary care physician for stroke risk factor modification. Recommend maintain blood pressure goal <130/80, diabetes with hemoglobin A1c goal below 6.5% and lipids with LDL cholesterol goal below 70 mg/dL.  - check BP at home and record and bring over to Dr. Damita Dunnings - follow up in 6 months.    Rosalin Hawking, MD PhD [facility] [address], [place], [place] [address] [phone]

## 2015-03-18 NOTE — Patient Instructions (Signed)
-   continue ASA and crestor for stroke prevention - your EEG showed some left hemisphere slow and transient sharps which did not correlate with your symptoms, will repeat EEG in 6 months - continue amitriptyline for migraine headache prophylaxis - Follow up with your primary care physician for stroke risk factor modification. Recommend maintain blood pressure goal <130/80, diabetes with hemoglobin A1c goal below 6.5% and lipids with LDL cholesterol goal below 70 mg/dL.  - check BP at home and record and bring over to Dr. Damita Dunnings - follow up in 6 months.

## 2015-03-20 DIAGNOSIS — H2511 Age-related nuclear cataract, right eye: Secondary | ICD-10-CM | POA: Diagnosis not present

## 2015-03-23 DIAGNOSIS — H268 Other specified cataract: Secondary | ICD-10-CM | POA: Diagnosis not present

## 2015-03-23 DIAGNOSIS — H2511 Age-related nuclear cataract, right eye: Secondary | ICD-10-CM | POA: Diagnosis not present

## 2015-03-26 DIAGNOSIS — H2511 Age-related nuclear cataract, right eye: Secondary | ICD-10-CM | POA: Diagnosis not present

## 2015-03-27 ENCOUNTER — Ambulatory Visit
Admission: RE | Admit: 2015-03-27 | Discharge: 2015-03-27 | Disposition: A | Discharge status: Home / Self Care | Payer: Commercial Managed Care - HMO | Source: Ambulatory Visit

## 2015-03-27 DIAGNOSIS — Z1231 Encounter for screening mammogram for malignant neoplasm of breast: Secondary | ICD-10-CM

## 2015-03-30 ENCOUNTER — Other Ambulatory Visit: Payer: Self-pay | Admitting: Family Medicine

## 2015-03-30 DIAGNOSIS — R928 Other abnormal and inconclusive findings on diagnostic imaging of breast: Secondary | ICD-10-CM

## 2015-03-31 ENCOUNTER — Ambulatory Visit: Payer: Self-pay

## 2015-04-02 ENCOUNTER — Ambulatory Visit
Admission: RE | Admit: 2015-04-02 | Discharge: 2015-04-02 | Disposition: A | Discharge status: Home / Self Care | Payer: Commercial Managed Care - HMO | Source: Ambulatory Visit | Attending: Family Medicine | Admitting: Family Medicine

## 2015-04-02 ENCOUNTER — Other Ambulatory Visit: Payer: Self-pay | Admitting: Family Medicine

## 2015-04-02 ENCOUNTER — Other Ambulatory Visit: Payer: Self-pay

## 2015-04-02 ENCOUNTER — Telehealth: Payer: Self-pay | Admitting: Family Medicine

## 2015-04-02 DIAGNOSIS — R928 Other abnormal and inconclusive findings on diagnostic imaging of breast: Secondary | ICD-10-CM

## 2015-04-02 DIAGNOSIS — R921 Mammographic calcification found on diagnostic imaging of breast: Secondary | ICD-10-CM | POA: Diagnosis not present

## 2015-04-02 NOTE — Telephone Encounter (Signed)
Changed, thanks

## 2015-04-02 NOTE — Telephone Encounter (Signed)
Per cherish @ [facility]  Appointment 5/5 pt aware

## 2015-04-02 NOTE — Telephone Encounter (Signed)
Dr Damita Dunnings I called to schedule ms [facility] diagnostic mammogram and they need the order changed to say mmdigbreast tomo uni left    [id number] Can you change this for me thanks

## 2015-04-04 ENCOUNTER — Other Ambulatory Visit: Payer: Self-pay | Admitting: Family Medicine

## 2015-04-07 ENCOUNTER — Ambulatory Visit
Admission: RE | Admit: 2015-04-07 | Discharge: 2015-04-07 | Disposition: A | Discharge status: Home / Self Care | Payer: Commercial Managed Care - HMO | Source: Ambulatory Visit | Attending: Family Medicine | Admitting: Family Medicine

## 2015-04-07 ENCOUNTER — Other Ambulatory Visit: Payer: Self-pay | Admitting: Family Medicine

## 2015-04-07 DIAGNOSIS — D0512 Intraductal carcinoma in situ of left breast: Secondary | ICD-10-CM | POA: Diagnosis not present

## 2015-04-07 DIAGNOSIS — R928 Other abnormal and inconclusive findings on diagnostic imaging of breast: Secondary | ICD-10-CM

## 2015-04-07 DIAGNOSIS — R921 Mammographic calcification found on diagnostic imaging of breast: Secondary | ICD-10-CM | POA: Diagnosis not present

## 2015-04-10 ENCOUNTER — Encounter: Payer: Self-pay | Admitting: Family Medicine

## 2015-04-13 ENCOUNTER — Ambulatory Visit: Payer: Self-pay | Admitting: Surgery

## 2015-04-13 DIAGNOSIS — D0512 Intraductal carcinoma in situ of left breast: Secondary | ICD-10-CM | POA: Diagnosis not present

## 2015-04-13 NOTE — H&P (Signed)
Kelly Kennedy 04/13/2015 10:40 AM Location: [facility] Patient #: [id number] DOB: Apr 16, 1941 Widowed / Language: Undefined / Race: Undefined Female History of Present Illness Kelly Kennedy A. Eural Holzschuh MD; 04/13/2015 11:44 AM) Patient words: check breast Pt sent at the request Dr Almyra Free for left UQ breast microcalcifications core bx shown to be DCIS. Pt denies history of breast mass pain or nipple discharge. No family history of breast cancer or breast problems.      ADDITIONAL INFORMATION: PROGNOSTIC INDICATORS - ACIS Results: IMMUNOHISTOCHEMICAL AND MORPHOMETRIC ANALYSIS BY THE AUTOMATED CELLULAR IMAGING SYSTEM (ACIS) Estrogen Receptor: 97%, POSITIVE, STRONG STAINING INTENSITY Progesterone Receptor: 97%, POSITIVE, STRONG STAINING INTENSITY REFERENCE RANGE ESTROGEN RECEPTOR NEGATIVE <1% POSITIVE =>1% PROGESTERONE RECEPTOR NEGATIVE <1% POSITIVE =>1% All controls stained appropriately [name] Pathologist, Electronic Signature ( Signed 04/10/2015) FINAL DIAGNOSIS Diagnosis Breast, left, needle core biopsy, upper outer quadrant - DUCTAL CARCINOMA IN SITU WITH CALCIFICATIONS AND NECROSIS. - SEE COMMENT. low to intermediate gradr           CLINICAL DATA: Patient recalled from screening for left breast calcifications.  EXAM: DIGITAL DIAGNOSTIC LEFT MAMMOGRAM  COMPARISON: [facility]  ACR Breast Density Category c: The breast tissue is heterogeneously dense, which may obscure small masses.  FINDINGS: Within the upper-outer left breast magnification CC and true lateral views demonstrate developing segmental calcifications. These appear slightly pleomorphic along the posterior margin with a focal group group measuring 8 mm. Additionally anteriorly and slightly more superiorly there is a 10 mm group of loosely grouped round and punctate calcifications, new from prior.  IMPRESSION: Developing segmental calcifications upper outer left  breast.  RECOMMENDATION: Recommend stereotactic guided core needle biopsy of the morphologically more suspicious calcifications within the posterior aspect of the upper-outer left breast. If these demonstrate benign pathology, six-month followup left breast diagnostic mammography with magnification views of the more anterior extent of calcifications can be obtained.  Procedure scheduled for 04/07/2015 at 11 a.m.  I have discussed the findings and recommendations with the patient. Results were also provided in writing at the conclusion of the visit. If applicable, a reminder letter will be sent to the patient regarding the next appointment.  BI-RADS CATEGORY 4: Suspicious.   Electronically Signed By: [name] On: 04/02/2015 14:56.  The patient is a 74 year old female   Other Problems Kelly Kennedy, [place]; 04/13/2015 10:40 AM) Arthritis Back Pain Cerebrovascular Accident Cholelithiasis Depression Diabetes Mellitus Gastroesophageal Reflux Disease Hemorrhoids High blood pressure Hypercholesterolemia Inguinal Hernia Thyroid Disease  Past Surgical History Kelly Kennedy, CMA; 04/13/2015 10:40 AM) Appendectomy Breast Biopsy Left. Cataract Surgery Bilateral. Colon Polyp Removal - Colonoscopy Gallbladder Surgery - Open Hysterectomy (not due to cancer) - Partial  Diagnostic Studies History Kelly Kennedy, CMA; 04/13/2015 10:40 AM) Colonoscopy 1-5 years ago Mammogram within last year Pap Smear 1-5 years ago  Allergies Kelly Kennedy, CMA; 04/13/2015 10:41 AM) Niacin *VITAMINS* Rash.  Medication History Kelly Kennedy, CMA; 04/13/2015 10:43 AM) TraMADol HCl (50MG  Tablet, Oral) Active. Amitriptyline HCl (50MG  Tablet, Oral) Active. Amoxicillin (875MG  Tablet, Oral) Active. Combigan (0.2-0.5% Solution, Ophthalmic) Active. Azithromycin (250MG  Tablet, Oral) Active. Crestor (10MG  Tablet, Oral) Active. Durezol (0.05% Emulsion, Ophthalmic)  Active. Estradiol (0.1MG /24HR Patch Weekly, Transdermal) Active. Fluconazole (150MG  Tablet, Oral) Active. Fluocinonide (0.05% Cream, External) Active. Furosemide (20MG  Tablet, Oral) Active. GlipiZIDE (5MG  Tablet, Oral) Active. Ketorolac Tromethamine (0.4% Solution, Ophthalmic) Active. Klor-Con 10 (10MEQ Tablet ER, Oral) Active. Losartan Potassium (100MG  Tablet, Oral) Active. Levothyroxine Sodium (100MCG Tablet, Oral) Active. Magnesium Oxide (400 (241.3 Mg)MG Tablet, Oral) Active. Nitrostat (0.4MG   Tab Sublingual, Sublingual) Active. Ofloxacin (0.3% Solution, Ophthalmic) Active. Voltaren (1% Gel, Transdermal) Active. Medications Reconciled Refresh (1% Solution, Ophthalmic) Active.  Social History Kelly Kennedy, [place]; 04/13/2015 10:40 AM) Alcohol use Remotely quit alcohol use. Caffeine use Tea. No drug use Tobacco use Former smoker.  Family History Kelly Kennedy, [place]; 04/13/2015 10:40 AM) Anesthetic complications Mother. Arthritis Brother, Mother. Bleeding disorder Mother. Depression Brother, Daughter, Mother, Son. Diabetes Mellitus Mother. Heart Disease Mother. Hypertension Mother. Kidney Disease Mother. Respiratory Condition Brother. Thyroid problems Mother.  Pregnancy / Birth History Kelly Kennedy, CMA; 04/13/2015 10:40 AM) Age at menarche 22 years. Age of menopause <46 Gravida 4 Irregular periods Maternal age 15-20 Para 3     Review of Systems Kelly Kennedy CMA; 04/13/2015 10:40 AM) General Present- Fatigue and Weight Gain. Not Present- Appetite Loss, Chills, Fever, Night Sweats and Weight Loss. Skin Present- Change in Wart/Mole, Dryness and Rash. Not Present- Hives, Jaundice, New Lesions, Non-Healing Wounds and Ulcer. HEENT Present- Hoarseness, Nose Bleed, Sinus Pain, Sore Throat and Wears glasses/contact lenses. Not Present- Earache, Hearing Loss, Oral Ulcers, Ringing in the Ears, Seasonal Allergies, Visual Disturbances and Yellow  Eyes. Respiratory Not Present- Bloody sputum, Chronic Cough, Difficulty Breathing, Snoring and Wheezing. Breast Present- Breast Mass and Breast Pain. Not Present- Nipple Discharge and Skin Changes. Cardiovascular Present- Rapid Heart Rate and Swelling of Extremities. Not Present- Chest Pain, Difficulty Breathing Lying Down, Leg Cramps, Palpitations and Shortness of Breath. Gastrointestinal Present- Bloating, Chronic diarrhea, Difficulty Swallowing, Excessive gas, Hemorrhoids, Indigestion and Rectal Pain. Not Present- Abdominal Pain, Bloody Stool, Change in Bowel Habits, Constipation, Gets full quickly at meals, Nausea and Vomiting. Female Genitourinary Present- Frequency and Urgency. Not Present- Nocturia, Painful Urination and Pelvic Pain. Musculoskeletal Present- Back Pain, Joint Pain, Joint Stiffness, Muscle Pain and Swelling of Extremities. Not Present- Muscle Weakness. Neurological Present- Headaches, Numbness, Tremor and Weakness. Not Present- Decreased Memory, Fainting, Seizures, Tingling and Trouble walking. Psychiatric Present- Anxiety, Change in Sleep Pattern and Depression. Not Present- Bipolar, Fearful and Frequent crying. Endocrine Present- Hair Changes, Hot flashes and New Diabetes. Not Present- Cold Intolerance, Excessive Hunger and Heat Intolerance. Hematology Present- Easy Bruising and Gland problems. Not Present- Excessive bleeding, HIV and Persistent Infections.  Vitals Kelly Kennedy CMA; 04/13/2015 10:43 AM) 04/13/2015 10:43 AM Weight: 193 lb Height: 63in Body Surface Area: 1.97 m Body Mass Index: 34.19 kg/m Temp.: 98.32F(Oral)  Pulse: 80 (Regular)  Resp.: 17 (Unlabored)  BP: 138/80 (Sitting, Left Arm, Standard)     Physical Exam (Jabori Henegar A. Jahmiyah Dullea MD; 04/13/2015 11:44 AM)  General Mental Status-Alert. General Appearance-Consistent with stated age. Hydration-Well hydrated. Voice-Normal.  Head and Neck Head-normocephalic, atraumatic with no  lesions or palpable masses. Trachea-midline. Thyroid Gland Characteristics - normal size and consistency.  Eye Eyeball - Bilateral-Extraocular movements intact. Sclera/Conjunctiva - Bilateral-No scleral icterus.  Chest and Lung Exam Chest and lung exam reveals -quiet, even and easy respiratory effort with no use of accessory muscles and on auscultation, normal breath sounds, no adventitious sounds and normal vocal resonance. Inspection Chest Wall - Normal. Back - normal.  Breast Breast - Left-Symmetric, Non Tender, No Biopsy scars, no Dimpling, No Inflammation, No Lumpectomy scars, No Mastectomy scars, No Peau d' Orange. Breast - Right-Symmetric, Non Tender, No Biopsy scars, no Dimpling, No Inflammation, No Lumpectomy scars, No Mastectomy scars, No Peau d' Orange. Breast Lump-No Palpable Breast Mass. Note: bruising left breast noted.   Cardiovascular Cardiovascular examination reveals -normal heart sounds, regular rate and rhythm with no murmurs and normal pedal pulses bilaterally.  Abdomen  Inspection Inspection of the abdomen reveals - No Hernias. Skin - Scar - no surgical scars. Palpation/Percussion Palpation and Percussion of the abdomen reveal - Soft, Non Tender, No Rebound tenderness, No Rigidity (guarding) and No hepatosplenomegaly. Auscultation Auscultation of the abdomen reveals - Bowel sounds normal.  Neurologic Neurologic evaluation reveals -alert and oriented x 3 with no impairment of recent or remote memory. Mental Status-Normal.  Musculoskeletal Normal Exam - Left-Upper Extremity Strength Normal and Lower Extremity Strength Normal. Normal Exam - Right-Upper Extremity Strength Normal and Lower Extremity Strength Normal.  Lymphatic Head & Neck  General Head & Neck Lymphatics: Bilateral - Description - Normal. Axillary  General Axillary Region: Bilateral - Description - Normal. Tenderness - Non Tender. Femoral & Inguinal  Generalized  Femoral & Inguinal Lymphatics: Bilateral - Description - Normal. Tenderness - Non Tender.    Assessment & Plan (Severina Sykora A. Mariacristina Aday MD; 04/13/2015 11:22 AM)  DCIS (DUCTAL CARCINOMA IN SITU), LEFT (233.0  D05.12) Impression: small area low to intermediate grade discussed medical and surgical management and role of radiatio therapy. Pt would like to have left partial mastectomy. Risk of partial mastectomy include bleeding, infection, seroma, more surgery, use of seed/wire, wound care, cosmetic deformity and the need for other treatments, death , blood clots, death. Pt agrees to proceed.  Current Plans Pt Education - [facility] Breast Biopsy [facility]CI

## 2015-04-15 ENCOUNTER — Other Ambulatory Visit: Payer: Self-pay | Admitting: Surgery

## 2015-04-15 DIAGNOSIS — R921 Mammographic calcification found on diagnostic imaging of breast: Secondary | ICD-10-CM

## 2015-04-22 ENCOUNTER — Ambulatory Visit
Admission: RE | Admit: 2015-04-22 | Discharge: 2015-04-22 | Disposition: A | Discharge status: Home / Self Care | Payer: Commercial Managed Care - HMO | Source: Ambulatory Visit | Attending: Surgery | Admitting: Surgery

## 2015-04-22 DIAGNOSIS — R921 Mammographic calcification found on diagnostic imaging of breast: Secondary | ICD-10-CM | POA: Diagnosis not present

## 2015-04-22 DIAGNOSIS — D0512 Intraductal carcinoma in situ of left breast: Secondary | ICD-10-CM | POA: Diagnosis not present

## 2015-04-24 ENCOUNTER — Ambulatory Visit: Payer: Self-pay | Admitting: Surgery

## 2015-04-24 DIAGNOSIS — D0512 Intraductal carcinoma in situ of left breast: Secondary | ICD-10-CM

## 2015-04-24 NOTE — H&P (Signed)
Kelly Kennedy 04/24/2015 9:02 AM Location: [facility] Patient #: [id number] DOB: 11-12-1941 Widowed / Language: Undefined / Race: Undefined Female  History of Present Illness Kelly Kennedy A. Naba Sneed Kennedy; 04/24/2015 9:38 AM) Patient words: Left breast cancer/second bx   PT RETURNS AFTER SECOND LEFT BREAST BIOPSY SHOWED ADITIONAL DCIS. IT IS 3.5 CM FROM FIRST AREA BUT IN SAME QUARANT. IT IS LOW TO INTERMEDIATE GRADE.     Breast, left, needle core biopsy, upper outer quadrant - DUCTAL CARCINOMA IN SITU WITH CALCIFICATIONS AND NECROSIS. - SEE COMMENT. Microscopic Comment Sections demonstrate ductal carcinoma in situ with calcifications and necrosis (comedonecrosis). Although grading is best performed on excision specimen, the ductal carcinoma in situ is intermediate grade as sampled. A quantitative estrogen receptor and progesterone receptor will be performed and reported in an addendum to follow. The findings are called to the [facility] on 04/23/15. Dr. Donato Heinz has seen this case in consultation with agreement. (RH:kh 04/23/15) Kelly Niece Kennedy Pathologist, Electronic Signature (Case signed 04/23/2015.  The patient is a 74 year old female    Vitals Kelly Kennedy CMA; 04/24/2015 9:04 AM) 04/24/2015 9:04 AM Weight: 190 lb Height: 63in Body Surface Area: 1.96 m Body Mass Index: 33.66 kg/m Temp.: 48F(Temporal)  Pulse: 78 (Regular)  Resp.: 18 (Unlabored)  BP: 142/86 (Sitting, Left Arm, Standard)    Physical Exam (Kelly Kennedy A. Amry Kelly Kennedy; 04/24/2015 9:38 AM) General Mental Status-Alert. General Appearance-Consistent with stated age. Hydration-Well hydrated. Voice-Normal.  Breast Note: LEFT BREAST WITH BUISE . NO MASS   Neurologic Neurologic evaluation reveals -alert and oriented x 3 with no impairment of recent or remote memory. Mental Status-Normal.  Lymphatic Axillary  General Axillary Region: Bilateral - Description  - Normal. Tenderness - Non Tender.    Assessment & Plan (Kelly Kennedy A. Oakley Kelly Kennedy; 04/24/2015 9:31 AM) DCIS (DUCTAL CARCINOMA IN SITU), LEFT (233.0  D05.12) Impression: small area low to intermediate grade discussed medical and surgical management and role of radiatio therapy. Pt has had a second biopsy in the left breast UOQ that is DCIS as well. Pt would like to have left partial mastectomy. Risk of partial mastectomy include bleeding, infection, seroma, more surgery, use of seed/wire, wound care, cosmetic deformity and the need for other treatments, death , blood clots, death. Pt agrees to proceed.  Pt would like to try partial mastcetomy and will need SLN biopsy given size adn location of her DCIS. sHE WILL NEED TWO WIRES. She understands possible cosmetic deformity and need for mastectomy if this fai Risk of sentinel lymph node mapping include bleeding, infection, lymphedema, shoulder pain. stiffness, dye allergy. cosmetic deformity , blood clots, death, need for more surgery. Pt agres to proceed. ls.. Current Plans  Pt Education - Patient information: Ductal carcinoma in situ (DCIS) (The Basics): discussed with patient and provided information. Pt Education - Patient information: Surgical procedures for breast cancer ? Mastectomy and breast conserving therapy (Beyond the Basics): discussed with patient and provided information. Pt Education - [facility] Breast Biopsy [facility]CI   Signed by Kelly Daniels, Kennedy (04/24/2015 9:39 AM)

## 2015-04-29 ENCOUNTER — Other Ambulatory Visit: Payer: Self-pay | Admitting: Surgery

## 2015-04-29 DIAGNOSIS — D0512 Intraductal carcinoma in situ of left breast: Secondary | ICD-10-CM

## 2015-05-04 ENCOUNTER — Encounter (HOSPITAL_BASED_OUTPATIENT_CLINIC_OR_DEPARTMENT_OTHER)
Admission: RE | Admit: 2015-05-04 | Discharge: 2015-05-04 | Disposition: A | Discharge status: Home / Self Care | Payer: Commercial Managed Care - HMO | Source: Ambulatory Visit | Attending: Surgery | Admitting: Surgery

## 2015-05-04 ENCOUNTER — Encounter (HOSPITAL_BASED_OUTPATIENT_CLINIC_OR_DEPARTMENT_OTHER): Payer: Self-pay | Admitting: *Deleted

## 2015-05-04 DIAGNOSIS — Z8673 Personal history of transient ischemic attack (TIA), and cerebral infarction without residual deficits: Secondary | ICD-10-CM | POA: Diagnosis not present

## 2015-05-04 DIAGNOSIS — F329 Major depressive disorder, single episode, unspecified: Secondary | ICD-10-CM | POA: Diagnosis not present

## 2015-05-04 DIAGNOSIS — M1990 Unspecified osteoarthritis, unspecified site: Secondary | ICD-10-CM | POA: Diagnosis not present

## 2015-05-04 DIAGNOSIS — K219 Gastro-esophageal reflux disease without esophagitis: Secondary | ICD-10-CM | POA: Diagnosis not present

## 2015-05-04 DIAGNOSIS — E119 Type 2 diabetes mellitus without complications: Secondary | ICD-10-CM | POA: Diagnosis not present

## 2015-05-04 DIAGNOSIS — I10 Essential (primary) hypertension: Secondary | ICD-10-CM | POA: Diagnosis not present

## 2015-05-04 DIAGNOSIS — Z87891 Personal history of nicotine dependence: Secondary | ICD-10-CM | POA: Diagnosis not present

## 2015-05-04 DIAGNOSIS — E780 Pure hypercholesterolemia: Secondary | ICD-10-CM | POA: Diagnosis not present

## 2015-05-04 DIAGNOSIS — Z6833 Body mass index (BMI) 33.0-33.9, adult: Secondary | ICD-10-CM | POA: Diagnosis not present

## 2015-05-04 DIAGNOSIS — Z86010 Personal history of colonic polyps: Secondary | ICD-10-CM | POA: Diagnosis not present

## 2015-05-04 DIAGNOSIS — Z888 Allergy status to other drugs, medicaments and biological substances status: Secondary | ICD-10-CM | POA: Diagnosis not present

## 2015-05-04 DIAGNOSIS — D0512 Intraductal carcinoma in situ of left breast: Secondary | ICD-10-CM | POA: Diagnosis not present

## 2015-05-04 LAB — COMPREHENSIVE METABOLIC PANEL
ALBUMIN: 3.8 g/dL (ref 3.5–5.0)
ALT: 33 U/L (ref 14–54)
ANION GAP: 9 (ref 5–15)
AST: 34 U/L (ref 15–41)
Alkaline Phosphatase: 58 U/L (ref 38–126)
BUN: 9 mg/dL (ref 6–20)
CO2: 26 mmol/L (ref 22–32)
CREATININE: 0.81 mg/dL (ref 0.44–1.00)
Calcium: 9.1 mg/dL (ref 8.9–10.3)
Chloride: 104 mmol/L (ref 101–111)
GFR calc Af Amer: >60 mL/min (ref >60)
Glucose, Bld: 119 mg/dL — ABNORMAL HIGH (ref 65–99)
Potassium: 4.2 mmol/L (ref 3.5–5.1)
Sodium: 139 mmol/L (ref 135–145)
Total Bilirubin: 0.4 mg/dL (ref 0.3–1.2)
Total Protein: 6.7 g/dL (ref 6.5–8.1)

## 2015-05-04 LAB — CBC WITH DIFFERENTIAL/PLATELET
Basophils Absolute: 0 10*3/uL (ref 0.0–0.1)
Basophils Relative: 0 % (ref 0–1)
EOS ABS: 0 10*3/uL (ref 0.0–0.7)
Eosinophils Relative: 0 % (ref 0–5)
HCT: 36.3 % (ref 36.0–46.0)
Hemoglobin: 12.1 g/dL (ref 12.0–15.0)
Lymphocytes Relative: 50 % — ABNORMAL HIGH (ref 12–46)
Lymphs Abs: 2.4 10*3/uL (ref 0.7–4.0)
MCH: 29.1 pg (ref 26.0–34.0)
MCHC: 33.3 g/dL (ref 30.0–36.0)
MCV: 87.3 fL (ref 78.0–100.0)
MONOS PCT: 7 % (ref 3–12)
Monocytes Absolute: 0.4 10*3/uL (ref 0.1–1.0)
Neutro Abs: 2.1 10*3/uL (ref 1.7–7.7)
Neutrophils Relative %: 43 % (ref 43–77)
PLATELETS: 359 10*3/uL (ref 150–400)
RBC: 4.16 MIL/uL (ref 3.87–5.11)
RDW: 14.6 % (ref 11.5–15.5)
WBC: 4.9 10*3/uL (ref 4.0–10.5)

## 2015-05-06 ENCOUNTER — Ambulatory Visit (HOSPITAL_BASED_OUTPATIENT_CLINIC_OR_DEPARTMENT_OTHER)
Admission: RE | Admit: 2015-05-06 | Discharge: 2015-05-06 | Disposition: A | Discharge status: Home / Self Care | Payer: Commercial Managed Care - HMO | Source: Ambulatory Visit | Attending: Surgery | Admitting: Surgery

## 2015-05-06 ENCOUNTER — Ambulatory Visit (HOSPITAL_BASED_OUTPATIENT_CLINIC_OR_DEPARTMENT_OTHER): Payer: Commercial Managed Care - HMO | Admitting: Anesthesiology

## 2015-05-06 ENCOUNTER — Ambulatory Visit
Admission: RE | Admit: 2015-05-06 | Discharge: 2015-05-06 | Disposition: A | Discharge status: Home / Self Care | Payer: Commercial Managed Care - HMO | Source: Ambulatory Visit | Attending: Surgery | Admitting: Surgery

## 2015-05-06 ENCOUNTER — Encounter (HOSPITAL_BASED_OUTPATIENT_CLINIC_OR_DEPARTMENT_OTHER): Payer: Self-pay | Admitting: *Deleted

## 2015-05-06 ENCOUNTER — Encounter (HOSPITAL_COMMUNITY)
Admission: RE | Admit: 2015-05-06 | Discharge: 2015-05-06 | Disposition: A | Discharge status: Home / Self Care | Payer: Commercial Managed Care - HMO | Source: Ambulatory Visit | Attending: Surgery | Admitting: Surgery

## 2015-05-06 ENCOUNTER — Encounter (HOSPITAL_BASED_OUTPATIENT_CLINIC_OR_DEPARTMENT_OTHER)
Admission: RE | Disposition: A | Discharge status: Home / Self Care | Payer: Self-pay | Source: Ambulatory Visit | Attending: Surgery

## 2015-05-06 ENCOUNTER — Other Ambulatory Visit: Payer: Self-pay | Admitting: Surgery

## 2015-05-06 DIAGNOSIS — K219 Gastro-esophageal reflux disease without esophagitis: Secondary | ICD-10-CM | POA: Insufficient documentation

## 2015-05-06 DIAGNOSIS — Z888 Allergy status to other drugs, medicaments and biological substances status: Secondary | ICD-10-CM | POA: Insufficient documentation

## 2015-05-06 DIAGNOSIS — D0512 Intraductal carcinoma in situ of left breast: Secondary | ICD-10-CM

## 2015-05-06 DIAGNOSIS — G8918 Other acute postprocedural pain: Secondary | ICD-10-CM | POA: Diagnosis not present

## 2015-05-06 DIAGNOSIS — I10 Essential (primary) hypertension: Secondary | ICD-10-CM | POA: Diagnosis not present

## 2015-05-06 DIAGNOSIS — E780 Pure hypercholesterolemia: Secondary | ICD-10-CM | POA: Insufficient documentation

## 2015-05-06 DIAGNOSIS — M1990 Unspecified osteoarthritis, unspecified site: Secondary | ICD-10-CM | POA: Insufficient documentation

## 2015-05-06 DIAGNOSIS — F329 Major depressive disorder, single episode, unspecified: Secondary | ICD-10-CM | POA: Insufficient documentation

## 2015-05-06 DIAGNOSIS — Z87891 Personal history of nicotine dependence: Secondary | ICD-10-CM | POA: Insufficient documentation

## 2015-05-06 DIAGNOSIS — Z8673 Personal history of transient ischemic attack (TIA), and cerebral infarction without residual deficits: Secondary | ICD-10-CM | POA: Diagnosis not present

## 2015-05-06 DIAGNOSIS — E119 Type 2 diabetes mellitus without complications: Secondary | ICD-10-CM | POA: Diagnosis not present

## 2015-05-06 DIAGNOSIS — R079 Chest pain, unspecified: Secondary | ICD-10-CM | POA: Diagnosis not present

## 2015-05-06 DIAGNOSIS — Z86010 Personal history of colonic polyps: Secondary | ICD-10-CM | POA: Insufficient documentation

## 2015-05-06 DIAGNOSIS — R921 Mammographic calcification found on diagnostic imaging of breast: Secondary | ICD-10-CM | POA: Diagnosis not present

## 2015-05-06 DIAGNOSIS — Z6833 Body mass index (BMI) 33.0-33.9, adult: Secondary | ICD-10-CM | POA: Insufficient documentation

## 2015-05-06 HISTORY — DX: Intraductal carcinoma in situ of left breast: D05.12

## 2015-05-06 HISTORY — PX: PARTIAL MASTECTOMY WITH NEEDLE LOCALIZATION AND AXILLARY SENTINEL LYMPH NODE BX: SHX6009

## 2015-05-06 HISTORY — DX: Malignant (primary) neoplasm, unspecified: C80.1

## 2015-05-06 LAB — GLUCOSE, CAPILLARY
GLUCOSE-CAPILLARY: 117 mg/dL — AB (ref 65–99)
Glucose-Capillary: 105 mg/dL — ABNORMAL HIGH (ref 65–99)

## 2015-05-06 SURGERY — PARTIAL MASTECTOMY WITH NEEDLE LOCALIZATION AND AXILLARY SENTINEL LYMPH NODE BX
Anesthesia: Regional | Site: Breast | Laterality: Left

## 2015-05-06 MED ORDER — OXYCODONE HCL 5 MG PO TABS
5.0000 mg | ORAL_TABLET | Freq: Once | ORAL | Status: AC | PRN
  Administered 2015-05-06: 5 mg via ORAL

## 2015-05-06 MED ORDER — OXYCODONE HCL 5 MG PO TABS
ORAL_TABLET | ORAL | Status: AC
  Filled 2015-05-06: qty 1

## 2015-05-06 MED ORDER — HYDROMORPHONE HCL 1 MG/ML IJ SOLN
INTRAMUSCULAR | Status: AC
  Filled 2015-05-06: qty 1

## 2015-05-06 MED ORDER — HYDROMORPHONE HCL 1 MG/ML IJ SOLN
0.2500 mg | INTRAMUSCULAR | Status: DC | PRN
  Administered 2015-05-06: 0.5 mg via INTRAVENOUS

## 2015-05-06 MED ORDER — SODIUM CHLORIDE 0.9 % IJ SOLN
INTRAMUSCULAR | Status: DC | PRN
  Administered 2015-05-06: 5 mL via INTRAMUSCULAR

## 2015-05-06 MED ORDER — OXYCODONE HCL 5 MG/5ML PO SOLN: 5.0000 mg | Freq: Once | ORAL | Status: AC | PRN

## 2015-05-06 MED ORDER — CEFAZOLIN SODIUM-DEXTROSE 2-3 GM-% IV SOLR
INTRAVENOUS | Status: AC
  Filled 2015-05-06: qty 50

## 2015-05-06 MED ORDER — MIDAZOLAM HCL 2 MG/2ML IJ SOLN
INTRAMUSCULAR | Status: AC
  Filled 2015-05-06: qty 2

## 2015-05-06 MED ORDER — OXYCODONE-ACETAMINOPHEN 5-325 MG PO TABS: 1.0000 | ORAL_TABLET | ORAL | Status: DC | PRN

## 2015-05-06 MED ORDER — LACTATED RINGERS IV SOLN
INTRAVENOUS | Status: DC
  Administered 2015-05-06 (×2): via INTRAVENOUS

## 2015-05-06 MED ORDER — ONDANSETRON HCL 4 MG/2ML IJ SOLN
INTRAMUSCULAR | Status: DC | PRN
  Administered 2015-05-06: 4 mg via INTRAVENOUS

## 2015-05-06 MED ORDER — PHENYLEPHRINE HCL 10 MG/ML IJ SOLN
INTRAMUSCULAR | Status: DC | PRN
  Administered 2015-05-06: 80 ug via INTRAVENOUS

## 2015-05-06 MED ORDER — MIDAZOLAM HCL 2 MG/2ML IJ SOLN
1.0000 mg | INTRAMUSCULAR | Status: DC | PRN
  Administered 2015-05-06: 1 mg via INTRAVENOUS

## 2015-05-06 MED ORDER — GLYCOPYRROLATE 0.2 MG/ML IJ SOLN: 0.2000 mg | Freq: Once | INTRAMUSCULAR | Status: DC | PRN

## 2015-05-06 MED ORDER — BUPIVACAINE-EPINEPHRINE (PF) 0.5% -1:200000 IJ SOLN
INTRAMUSCULAR | Status: DC | PRN
  Administered 2015-05-06: 23 mL via PERINEURAL

## 2015-05-06 MED ORDER — FENTANYL CITRATE (PF) 100 MCG/2ML IJ SOLN
50.0000 ug | INTRAMUSCULAR | Status: AC | PRN
  Administered 2015-05-06 (×4): 25 ug via INTRAVENOUS
  Administered 2015-05-06: 100 ug via INTRAVENOUS

## 2015-05-06 MED ORDER — MEPERIDINE HCL 25 MG/ML IJ SOLN: 6.2500 mg | INTRAMUSCULAR | Status: DC | PRN

## 2015-05-06 MED ORDER — TECHNETIUM TC 99M SULFUR COLLOID FILTERED
1.0000 | Freq: Once | INTRAVENOUS | Status: AC | PRN
  Administered 2015-05-06: 1 via INTRADERMAL

## 2015-05-06 MED ORDER — FENTANYL CITRATE (PF) 100 MCG/2ML IJ SOLN
INTRAMUSCULAR | Status: AC
  Filled 2015-05-06: qty 4

## 2015-05-06 MED ORDER — CEFAZOLIN SODIUM-DEXTROSE 2-3 GM-% IV SOLR
2.0000 g | INTRAVENOUS | Status: AC
  Administered 2015-05-06: 2 g via INTRAVENOUS

## 2015-05-06 MED ORDER — DEXAMETHASONE SODIUM PHOSPHATE 4 MG/ML IJ SOLN
INTRAMUSCULAR | Status: DC | PRN
  Administered 2015-05-06: 5 mg via INTRAVENOUS

## 2015-05-06 MED ORDER — PROPOFOL 10 MG/ML IV BOLUS
INTRAVENOUS | Status: DC | PRN
  Administered 2015-05-06: 100 mg via INTRAVENOUS
  Administered 2015-05-06: 150 mg via INTRAVENOUS

## 2015-05-06 MED ORDER — CHLORHEXIDINE GLUCONATE 4 % EX LIQD: 1.0000 "application " | Freq: Once | CUTANEOUS | Status: DC

## 2015-05-06 MED ORDER — BUPIVACAINE-EPINEPHRINE (PF) 0.25% -1:200000 IJ SOLN
INTRAMUSCULAR | Status: AC
  Filled 2015-05-06: qty 30

## 2015-05-06 MED ORDER — METHYLENE BLUE 1 % INJ SOLN
INTRAMUSCULAR | Status: AC
  Filled 2015-05-06: qty 10

## 2015-05-06 MED ORDER — BUPIVACAINE-EPINEPHRINE 0.25% -1:200000 IJ SOLN
INTRAMUSCULAR | Status: DC | PRN
  Administered 2015-05-06: 10 mL

## 2015-05-06 MED ORDER — LIDOCAINE HCL (CARDIAC) 20 MG/ML IV SOLN
INTRAVENOUS | Status: DC | PRN
  Administered 2015-05-06: 60 mg via INTRAVENOUS

## 2015-05-06 MED ORDER — FENTANYL CITRATE (PF) 100 MCG/2ML IJ SOLN
INTRAMUSCULAR | Status: AC
  Filled 2015-05-06: qty 2

## 2015-05-06 MED ORDER — PHENYLEPHRINE HCL 10 MG/ML IJ SOLN
10.0000 mg | INTRAVENOUS | Status: DC | PRN
  Administered 2015-05-06: 50 ug/min via INTRAVENOUS

## 2015-05-06 SURGICAL SUPPLY — 63 items
APPLIER CLIP 9.375 MED OPEN (MISCELLANEOUS) ×3
APR CLP MED 9.3 20 MLT OPN (MISCELLANEOUS) ×1
BANDAGE ELASTIC 6 VELCRO ST LF (GAUZE/BANDAGES/DRESSINGS) IMPLANT
BINDER BREAST LRG (GAUZE/BANDAGES/DRESSINGS) IMPLANT
BINDER BREAST MEDIUM (GAUZE/BANDAGES/DRESSINGS) IMPLANT
BINDER BREAST XLRG (GAUZE/BANDAGES/DRESSINGS) IMPLANT
BINDER BREAST XXLRG (GAUZE/BANDAGES/DRESSINGS) IMPLANT
BLADE CLIPPER SURG (BLADE) IMPLANT
BLADE SURG 10 STRL SS (BLADE) ×3 IMPLANT
BLADE SURG 15 STRL LF DISP TIS (BLADE) ×1 IMPLANT
BLADE SURG 15 STRL SS (BLADE) ×3
CANISTER SUCT 1200ML W/VALVE (MISCELLANEOUS) ×3 IMPLANT
CHLORAPREP W/TINT 26ML (MISCELLANEOUS) ×3 IMPLANT
CLIP APPLIE 9.375 MED OPEN (MISCELLANEOUS) IMPLANT
COVER BACK TABLE 60X90IN (DRAPES) ×3 IMPLANT
COVER MAYO STAND STRL (DRAPES) ×3 IMPLANT
COVER PROBE W GEL 5X96 (DRAPES) ×3 IMPLANT
DECANTER SPIKE VIAL GLASS SM (MISCELLANEOUS) IMPLANT
DEVICE DUBIN W/COMP PLATE 8390 (MISCELLANEOUS) ×2 IMPLANT
DRAIN CHANNEL 19F RND (DRAIN) ×2 IMPLANT
DRAIN HEMOVAC 1/8 X 5 (WOUND CARE) IMPLANT
DRAPE LAPAROSCOPIC ABDOMINAL (DRAPES) ×3 IMPLANT
DRAPE UTILITY XL STRL (DRAPES) ×3 IMPLANT
ELECT COATED BLADE 2.86 ST (ELECTRODE) ×3 IMPLANT
ELECT REM PT RETURN 9FT ADLT (ELECTROSURGICAL) ×3
ELECTRODE REM PT RTRN 9FT ADLT (ELECTROSURGICAL) ×1 IMPLANT
EVACUATOR SILICONE 100CC (DRAIN) ×2 IMPLANT
GLOVE BIO SURGEON STRL SZ 6.5 (GLOVE) ×1 IMPLANT
GLOVE BIO SURGEONS STRL SZ 6.5 (GLOVE) ×1
GLOVE BIOGEL PI IND STRL 7.0 (GLOVE) IMPLANT
GLOVE BIOGEL PI IND STRL 8 (GLOVE) ×1 IMPLANT
GLOVE BIOGEL PI INDICATOR 7.0 (GLOVE) ×4
GLOVE BIOGEL PI INDICATOR 8 (GLOVE) ×2
GLOVE ECLIPSE 8.0 STRL XLNG CF (GLOVE) ×3 IMPLANT
GOWN STRL REUS W/ TWL LRG LVL3 (GOWN DISPOSABLE) ×2 IMPLANT
GOWN STRL REUS W/TWL LRG LVL3 (GOWN DISPOSABLE) ×6
HEMOSTAT SURGICEL 2X14 (HEMOSTASIS) ×6 IMPLANT
KIT MARKER MARGIN INK (KITS) ×3 IMPLANT
LIQUID BAND (GAUZE/BANDAGES/DRESSINGS) ×6 IMPLANT
NDL HYPO 25X1 1.5 SAFETY (NEEDLE) ×2 IMPLANT
NDL SAFETY ECLIPSE 18X1.5 (NEEDLE) ×1 IMPLANT
NEEDLE HYPO 18GX1.5 SHARP (NEEDLE) ×3
NEEDLE HYPO 25X1 1.5 SAFETY (NEEDLE) ×6 IMPLANT
NS IRRIG 1000ML POUR BTL (IV SOLUTION) ×3 IMPLANT
PACK BASIN DAY SURGERY FS (CUSTOM PROCEDURE TRAY) ×3 IMPLANT
PENCIL BUTTON HOLSTER BLD 10FT (ELECTRODE) ×3 IMPLANT
PIN SAFETY STERILE (MISCELLANEOUS) IMPLANT
SLEEVE SCD COMPRESS KNEE MED (MISCELLANEOUS) ×3 IMPLANT
SPONGE GAUZE 4X4 12PLY STER LF (GAUZE/BANDAGES/DRESSINGS) IMPLANT
SPONGE LAP 18X18 X RAY DECT (DISPOSABLE) IMPLANT
SPONGE LAP 4X18 X RAY DECT (DISPOSABLE) ×3 IMPLANT
STAPLER VISISTAT 35W (STAPLE) IMPLANT
SUT ETHILON 3 0 PS 1 (SUTURE) ×2 IMPLANT
SUT MNCRL AB 3-0 PS2 18 (SUTURE) ×6 IMPLANT
SUT SILK 2 0 SH (SUTURE) IMPLANT
SUT VICRYL 3-0 CR8 SH (SUTURE) ×3 IMPLANT
SYR BULB 3OZ (MISCELLANEOUS) ×3 IMPLANT
SYR CONTROL 10ML LL (SYRINGE) ×6 IMPLANT
TOWEL OR 17X24 6PK STRL BLUE (TOWEL DISPOSABLE) ×3 IMPLANT
TOWEL OR NON WOVEN STRL DISP B (DISPOSABLE) ×3 IMPLANT
TUBE CONNECTING 20'X1/4 (TUBING) ×1
TUBE CONNECTING 20X1/4 (TUBING) ×2 IMPLANT
YANKAUER SUCT BULB TIP NO VENT (SUCTIONS) ×3 IMPLANT

## 2015-05-06 NOTE — Anesthesia Postprocedure Evaluation (Signed)
  Anesthesia Post-op Note  Patient: Kelly Kennedy  Procedure(s) Performed: Procedure(s): LEFT BREAST PARTIAL MASTECTOMY WITH NEEDLE LOCALIZATION AND SENTINEL LYMPH NODE MAPPING (Left)  Patient Location: PACU  Anesthesia Type: General, Regional   Level of Consciousness: awake, alert  and oriented  Airway and Oxygen Therapy: Patient Spontanous Breathing  Post-op Pain: none  Post-op Assessment: Post-op Vital signs reviewed  Post-op Vital Signs: Reviewed  Last Vitals:  Filed Vitals:   05/06/15 1445  BP: 168/92  Pulse:   Temp:   Resp: 13    Complications: No apparent anesthesia complications

## 2015-05-06 NOTE — Discharge Instructions (Signed)
Bulb Drain Home Care A bulb drain consists of a thin rubber tube and a soft, round bulb that creates a gentle suction. The rubber tube is placed in the area where you had surgery. A bulb is attached to the end of the tube that is outside the body. The bulb drain removes excess fluid that normally builds up in a surgical wound after surgery. The color and amount of fluid will vary. Immediately after surgery, the fluid is bright red and is a little thicker than water. It may gradually change to a yellow or pink color and become more thin and water-like. When the amount decreases to about 1 or 2 tbsp in 24 hours, your health care provider will usually remove it. DAILY CARE 1. Keep the bulb flat (compressed) at all times, except while emptying it. The flatness creates suction. You can flatten the bulb by squeezing it firmly in the middle and then closing the cap. 2. Keep sites where the tube enters the skin dry and covered with a bandage (dressing). 3. Secure the tube 1-2 in (2.5-5.1 cm) below the insertion sites to keep it from pulling on your stitches. The tube is stitched in place and will not slip out. 4. Secure the bulb as directed by your health care provider. 5. For the first 3 days after surgery, there usually is more fluid in the bulb. Empty the bulb whenever it becomes half full because the bulb does not create enough suction if it is too full. The bulb could also overflow. Write down how much fluid you remove each time you empty your drain. Add up the amount removed in 24 hours. 6. Empty the bulb at the same time every day once the amount of fluid decreases and you only need to empty it once a day. Write down the amounts and the 24-hour totals to give to your health care provider. This helps your health care provider know when the tubes can be removed. EMPTYING THE BULB DRAIN Before emptying the bulb, get a measuring cup, a piece of paper and a pen, and wash your hands.  Gently run your fingers  down the tube (stripping) to empty any drainage from the tubing into the bulb. This may need to be done several times a day to clear the tubing of clots and tissue.  Open the bulb cap to release suction, which causes it to inflate. Do not touch the inside of the cap.  Gently run your fingers down the tube (stripping) to empty any drainage from the tubing into the bulb.  Hold the cap out of the way, and pour fluid into the measuring cup.   Squeeze the bulb to provide suction.  Replace the cap.   Check the tape that holds the tube to your skin. If it is becoming loose, you can remove the loose piece of tape and apply a new one. Then, pin the bulb to your shirt.   Write down the amount of fluid you emptied out. Write down the date and each time you emptied your bulb drain. (If there are 2 bulbs, note the amount of drainage from each bulb and keep the totals separate. Your health care provider will want to know the total amounts for each drain and which tube is draining more.)   Flush the fluid down the toilet and wash your hands.   Call your health care provider once you have less than 2 tbsp of fluid collecting in the bulb drain every 24 hours. If  there is drainage around the tube site, change dressings and keep the area dry. Cleanse around tube with sterile saline and place dry gauze around site. This gauze should be changed when it is soiled. If it stays clean and unsoiled, it should still be changed daily.  SEEK MEDICAL CARE IF: 1. Your drainage has a bad smell or is cloudy.  2. You have a fever.  3. Your drainage is increasing instead of decreasing.  4. Your tube fell out.  5. You have redness or swelling around the tube site.  6. You have drainage from a surgical wound.  7. Your bulb drain will not stay flat after you empty it.  MAKE SURE YOU:   Understand these instructions.  Will watch your condition.  Will get help right away if you are not doing well or get  worse. Document Released: 11/11/2000 Document Revised: 03/31/2014 Document Reviewed: 04/18/2012 [facility] Patient Information 2015 [place], [place]. This information is not intended to replace advice given to you by your health care provider. Make sure you discuss any questions you have with your health care provider.       [place] Office Phone Number [phone]  BREAST BIOPSY/ PARTIAL MASTECTOMY: POST OP INSTRUCTIONS  Always review your discharge instruction sheet given to you by the facility where your surgery was performed.  IF YOU HAVE DISABILITY OR FAMILY LEAVE FORMS, YOU MUST BRING THEM TO THE OFFICE FOR PROCESSING.  DO NOT GIVE THEM TO YOUR DOCTOR.  1. A prescription for pain medication may be given to you upon discharge.  Take your pain medication as prescribed, if needed.  If narcotic pain medicine is not needed, then you may take acetaminophen (Tylenol) or ibuprofen (Advil) as needed. 2. Take your usually prescribed medications unless otherwise directed 3. If you need a refill on your pain medication, please contact your pharmacy.  They will contact our office to request authorization.  Prescriptions will not be filled after 5pm or on week-ends. 4. You should eat very light the first 24 hours after surgery, such as soup, crackers, pudding, etc.  Resume your normal diet the day after surgery. 5. Most patients will experience some swelling and bruising in the breast.  Ice packs and a good support bra will help.  Swelling and bruising can take several days to resolve.  6. It is common to experience some constipation if taking pain medication after surgery.  Increasing fluid intake and taking a stool softener will usually help or prevent this problem from occurring.  A mild laxative (Milk of Magnesia or Miralax) should be taken according to package directions if there are no bowel movements after 48 hours. 7. Unless discharge instructions indicate otherwise, you may  remove your bandages 24-48 hours after surgery, and you may shower at that time.  You may have steri-strips (small skin tapes) in place directly over the incision.  These strips should be left on the skin for 7-10 days.  If your surgeon used skin glue on the incision, you may shower in 24 hours.  The glue will flake off over the next 2-3 weeks.  Any sutures or staples will be removed at the office during your follow-up visit. 8. ACTIVITIES:  You may resume regular daily activities (gradually increasing) beginning the next day.  Wearing a good support bra or sports bra minimizes pain and swelling.  You may have sexual intercourse when it is comfortable. a. You may drive when you no longer are taking prescription pain medication, you  can comfortably wear a seatbelt, and you can safely maneuver your car and apply brakes. b. RETURN TO WORK:  ______________________________________________________________________________________ 9. You should see your doctor in the office for a follow-up appointment approximately two weeks after your surgery.  Your doctors nurse will typically make your follow-up appointment when she calls you with your pathology report.  Expect your pathology report 2-3 business days after your surgery.  You may call to check if you do not hear from [place] after three days. 10. OTHER INSTRUCTIONS: _______________________________________________________________________________________________ _____________________________________________________________________________________________________________________________________ _____________________________________________________________________________________________________________________________________ _____________________________________________________________________________________________________________________________________  WHEN TO CALL YOUR DOCTOR: 1. Fever over 101.0 2. Nausea and/or vomiting. 3. Extreme swelling or  bruising. 4. Continued bleeding from incision. 5. Increased pain, redness, or drainage from the incision.  The clinic staff is available to answer your questions during regular business hours.  Please dont hesitate to call and ask to speak to one of the nurses for clinical concerns.  If you have a medical emergency, go to the nearest emergency room or call 911.  A surgeon from [facility] is always on call at the hospital.  For further questions, please visit [email or web address]     Post Anesthesia Home Care Instructions  Activity: Get plenty of rest for the remainder of the day. A responsible adult should stay with you for 24 hours following the procedure.  For the next 24 hours, DO NOT: -Drive a car -Paediatric nurse -Drink alcoholic beverages -Take any medication unless instructed by your physician -Make any legal decisions or sign important papers.  Meals: Start with liquid foods such as gelatin or soup. Progress to regular foods as tolerated. Avoid greasy, spicy, heavy foods. If nausea and/or vomiting occur, drink only clear liquids until the nausea and/or vomiting subsides. Call your physician if vomiting continues.  Special Instructions/Symptoms: Your throat may feel dry or sore from the anesthesia or the breathing tube placed in your throat during surgery. If this causes discomfort, gargle with warm salt water. The discomfort should disappear within 24 hours.  If you had a scopolamine patch placed behind your ear for the management of post- operative nausea and/or vomiting:  1. The medication in the patch is effective for 72 hours, after which it should be removed.  Wrap patch in a tissue and discard in the trash. Wash hands thoroughly with soap and water. 2. You may remove the patch earlier than 72 hours if you experience unpleasant side effects which may include dry mouth, dizziness or visual disturbances. 3. Avoid touching the patch. Wash your hands  with soap and water after contact with the patch.   Call your surgeon if you experience:   1.  Fever over 101.0. 2.  Inability to urinate. 3.  Nausea and/or vomiting. 4.  Extreme swelling or bruising at the surgical site. 5.  Continued bleeding from the incision. 6.  Increased pain, redness or drainage from the incision. 7.  Problems related to your pain medication. 8. Any change in color, movement and/or sensation 9. Any problems and/or concerns        About my Jackson-Pratt Bulb Drain  What is a Jackson-Pratt bulb? A Jackson-Pratt is a soft, round device used to collect drainage. It is connected to a long, thin drainage catheter, which is held in place by one or two small stiches near your surgical incision site. When the bulb is squeezed, it forms a vacuum, forcing the drainage to empty into the bulb.  Emptying the Jackson-Pratt bulb- To empty the bulb: 1. Release the plug on  the top of the bulb. 2. Pour the bulb's contents into a measuring container which your nurse will provide. 3. Record the time emptied and amount of drainage. Empty the drain(s) as often as your     doctor or nurse recommends.  Date                  Time                    Amount (Drain 1)                 Amount (Drain 2)  _____________________________________________________________________  _____________________________________________________________________  _____________________________________________________________________  _____________________________________________________________________  _____________________________________________________________________  _____________________________________________________________________  _____________________________________________________________________  _____________________________________________________________________  Squeezing the Jackson-Pratt Bulb- To squeeze the bulb: 1. Make sure the plug at the top of the bulb is open. 2.  Squeeze the bulb tightly in your fist. You will hear air squeezing from the bulb. 3. Replace the plug while the bulb is squeezed. 4. Use a safety pin to attach the bulb to your clothing. This will keep the catheter from     pulling at the bulb insertion site.  When to call your doctor- Call your doctor if:  Drain site becomes red, swollen or hot.  You have a fever greater than 101 degrees F.  There is oozing at the drain site.  Drain falls out (apply a guaze bandage over the drain hole and secure it with tape).  Drainage increases daily not related to activity patterns. (You will usually have more drainage when you are active than when you are resting.)  Drainage has a bad odor.

## 2015-05-06 NOTE — Op Note (Signed)
Preoperative diagnosis: Left breast DCIS  Postoperative diagnosis: Same  Procedure: Left breast wire localized partial mastectomy 3 left sentinel lymph node mapping with methylene blue dye  Surgeon: [name] M.D.  Anesthesia: LMA with pectoral block and 0.25% Sensorcaine local with epinephrine  EBL: 50 mL  Specimen: Left breast mass with localizing wires and clips #1 #2           2 left axillary sentinel nodes   Drain 19 round French drain to lumpectomy cavity  Indications for procedure: Patient is a 74 year old female with left breast DCIS. Here for large region in her left upper outer quadrant but desired an attempt at  partial m partial. She understood he could be a cosmesis issue and she may require a mastectomy if this fails. The procedure has been discussed with the patient. Alternatives to surgery have been discussed with the patient.  Risks of surgery include bleeding,  Infection,  Seroma formation, death,  and the need for further surgery.   The patient understands and wishes to proceed.Sentinel lymph node mapping and dissection has been discussed with the patient.  Risk of bleeding,  Infection,  Seroma formation,  Additional procedures,,  Shoulder weakness ,  Shoulder stiffness,  Nerve and blood vessel injury and reaction to the mapping dyes have been discussed.  Alternatives to surgery have been discussed with the patient.  The patient agrees to proceed.  Description of procedure patient met in holding area and questions answered. Patient underwent injection of technetium sulfur colloid by nuclear medicine. Left breast was marked as the correct breast. She was taken back to the operating room and placed upon the OR table. After induction of LMA anesthesia, left breast was prepped and draped in a sterile fashion. 4 mL of methylene blue admixed with saline were injected in a subareolar position and timeout was done prior to this. The wires were in the left upper outer quadrant  breast a curvilinear incision was made between the wires. All tissue around all 3 wires was excised down to the pectoralis fascia posteriorly.  Gross margin negative .  The specimen was oriented in marked and radiograph revealed all 3 wires in the 2 clips to be present. This was discussed with radiologist. Hemostasis was achieved. Neoprobe was used and hot spot was identified and left axilla. 2 hot blue nodes removed background counts approached 0. The subcutaneous breast tissue was mobilized in all 4 directions off the pectoralis fascia and away from the skin to facilitate closure of this defect. 3-0 Vicryl used to approximate dissected breast tissue to fill the defect. 3 separate stab incision and 19 round drain was placed in the lumpectomy cavity in the sentinel lymph node bed secured to skin with 2-0 nylon. Skin was closed with 4-0 Monocryl. Dermabond applied. Breast binder placed. Bulb attached to drain. Charge of drain was complete. All final counts are found to be correct. Patient was awoke taken to recovery in satisfactory condition.

## 2015-05-06 NOTE — Anesthesia Procedure Notes (Addendum)
Anesthesia Regional Block:  Pectoralis block  Pre-Anesthetic Checklist: ,, timeout performed, Correct Patient, Correct Site, Correct Laterality, Correct Procedure, Correct Position, site marked, Risks and benefits discussed,  Surgical consent,  Pre-op evaluation,  At surgeon's request and post-op pain management  Laterality: Left and Upper  Prep: chloraprep       Needles:  Injection technique: Single-shot  Needle Type: Echogenic Needle     Needle Length: 9cm 9 cm Needle Gauge: 21 and 21 G    Additional Needles:  Procedures: ultrasound guided (picture in chart) Pectoralis block Narrative:  Start time: 05/06/2015 11:39 AM End time: 05/06/2015 11:44 AM Injection made incrementally with aspirations every 5 mL.  Performed by: Personally  Anesthesiologist: CREWS, DAVID   Procedure Name: LMA Insertion Date/Time: 05/06/2015 12:43 PM Performed by: Maryella Shivers Pre-anesthesia Checklist: Patient identified, Emergency Drugs available, Suction available and Patient being monitored Patient Re-evaluated:Patient Re-evaluated prior to inductionOxygen Delivery Method: Circle System Utilized Preoxygenation: Pre-oxygenation with 100% oxygen Intubation Type: IV induction Ventilation: Mask ventilation without difficulty LMA: LMA inserted LMA Size: 4.0 Number of attempts: 1 Airway Equipment and Method: Bite block Placement Confirmation: positive ETCO2 Tube secured with: Tape Dental Injury: Teeth and Oropharynx as per pre-operative assessment

## 2015-05-06 NOTE — H&P (View-Only) (Signed)
Kelly Kennedy 04/13/2015 10:40 AM Location: [facility] Patient #: [id number] DOB: 06-16-1941 Widowed / Language: Undefined / Race: Undefined Female History of Present Illness Kelly Kennedy A. [name] Kennedy; 04/13/2015 11:44 AM) Patient words: check breast Pt sent at the request Dr Almyra Free for left UQ breast microcalcifications core bx shown to be DCIS. Pt denies history of breast mass pain or nipple discharge. No family history of breast cancer or breast problems.      ADDITIONAL INFORMATION: PROGNOSTIC INDICATORS - ACIS Results: IMMUNOHISTOCHEMICAL AND MORPHOMETRIC ANALYSIS BY THE AUTOMATED CELLULAR IMAGING SYSTEM (ACIS) Estrogen Receptor: 97%, POSITIVE, STRONG STAINING INTENSITY Progesterone Receptor: 97%, POSITIVE, STRONG STAINING INTENSITY REFERENCE RANGE ESTROGEN RECEPTOR NEGATIVE <1% POSITIVE =>1% PROGESTERONE RECEPTOR NEGATIVE <1% POSITIVE =>1% All controls stained appropriately Kelly Kennedy Pathologist, Electronic Signature ( Signed 04/10/2015) FINAL DIAGNOSIS Diagnosis Breast, left, needle core biopsy, upper outer quadrant - DUCTAL CARCINOMA IN SITU WITH CALCIFICATIONS AND NECROSIS. - SEE COMMENT. low to intermediate gradr           CLINICAL DATA: Patient recalled from screening for left breast calcifications.  EXAM: DIGITAL DIAGNOSTIC LEFT MAMMOGRAM  COMPARISON: [facility]  ACR Breast Density Category c: The breast tissue is heterogeneously dense, which may obscure small masses.  FINDINGS: Within the upper-outer left breast magnification CC and true lateral views demonstrate developing segmental calcifications. These appear slightly pleomorphic along the posterior margin with a focal group group measuring 8 mm. Additionally anteriorly and slightly more superiorly there is a 10 mm group of loosely grouped round and punctate calcifications, new from prior.  IMPRESSION: Developing segmental calcifications upper outer left  breast.  RECOMMENDATION: Recommend stereotactic guided core needle biopsy of the morphologically more suspicious calcifications within the posterior aspect of the upper-outer left breast. If these demonstrate benign pathology, six-month followup left breast diagnostic mammography with magnification views of the more anterior extent of calcifications can be obtained.  Procedure scheduled for 04/07/2015 at 11 a.m.  I have discussed the findings and recommendations with the patient. Results were also provided in writing at the conclusion of the visit. If applicable, a reminder letter will be sent to the patient regarding the next appointment.  BI-RADS CATEGORY 4: Suspicious.   Electronically Signed By: [name] On: 04/02/2015 14:56.  The patient is a 74 year old female   Other Problems Kelly Kennedy, [place]; 04/13/2015 10:40 AM) Arthritis Back Pain Cerebrovascular Accident Cholelithiasis Depression Diabetes Mellitus Gastroesophageal Reflux Disease Hemorrhoids High blood pressure Hypercholesterolemia Inguinal Hernia Thyroid Disease  Past Surgical History Kelly Kennedy, CMA; 04/13/2015 10:40 AM) Appendectomy Breast Biopsy Left. Cataract Surgery Bilateral. Colon Polyp Removal - Colonoscopy Gallbladder Surgery - Open Hysterectomy (not due to cancer) - Partial  Diagnostic Studies History Kelly Kennedy, CMA; 04/13/2015 10:40 AM) Colonoscopy 1-5 years ago Mammogram within last year Pap Smear 1-5 years ago  Allergies Kelly Kennedy, CMA; 04/13/2015 10:41 AM) Niacin *VITAMINS* Rash.  Medication History Kelly Kennedy, CMA; 04/13/2015 10:43 AM) TraMADol HCl (50MG  Tablet, Oral) Active. Amitriptyline HCl (50MG  Tablet, Oral) Active. Amoxicillin (875MG  Tablet, Oral) Active. Combigan (0.2-0.5% Solution, Ophthalmic) Active. Azithromycin (250MG  Tablet, Oral) Active. Crestor (10MG  Tablet, Oral) Active. Durezol (0.05% Emulsion, Ophthalmic)  Active. Estradiol (0.1MG /24HR Patch Weekly, Transdermal) Active. Fluconazole (150MG  Tablet, Oral) Active. Fluocinonide (0.05% Cream, External) Active. Furosemide (20MG  Tablet, Oral) Active. GlipiZIDE (5MG  Tablet, Oral) Active. Ketorolac Tromethamine (0.4% Solution, Ophthalmic) Active. Klor-Con 10 (10MEQ Tablet ER, Oral) Active. Losartan Potassium (100MG  Tablet, Oral) Active. Levothyroxine Sodium (100MCG Tablet, Oral) Active. Magnesium Oxide (400 (241.3 Mg)MG Tablet, Oral) Active. Nitrostat (0.4MG   Tab Sublingual, Sublingual) Active. Ofloxacin (0.3% Solution, Ophthalmic) Active. Voltaren (1% Gel, Transdermal) Active. Medications Reconciled Refresh (1% Solution, Ophthalmic) Active.  Social History Kelly Kennedy, [place]; 04/13/2015 10:40 AM) Alcohol use Remotely quit alcohol use. Caffeine use Tea. No drug use Tobacco use Former smoker.  Family History Kelly Kennedy, [place]; 04/13/2015 10:40 AM) Anesthetic complications Mother. Arthritis Brother, Mother. Bleeding disorder Mother. Depression Brother, Daughter, Mother, Son. Diabetes Mellitus Mother. Heart Disease Mother. Hypertension Mother. Kidney Disease Mother. Respiratory Condition Brother. Thyroid problems Mother.  Pregnancy / Birth History Kelly Kennedy, CMA; 04/13/2015 10:40 AM) Age at menarche 61 years. Age of menopause <46 Gravida 4 Irregular periods Maternal age 14-20 Para 3     Review of Systems Kelly Kennedy CMA; 04/13/2015 10:40 AM) General Present- Fatigue and Weight Gain. Not Present- Appetite Loss, Chills, Fever, Night Sweats and Weight Loss. Skin Present- Change in Wart/Mole, Dryness and Rash. Not Present- Hives, Jaundice, New Lesions, Non-Healing Wounds and Ulcer. HEENT Present- Hoarseness, Nose Bleed, Sinus Pain, Sore Throat and Wears glasses/contact lenses. Not Present- Earache, Hearing Loss, Oral Ulcers, Ringing in the Ears, Seasonal Allergies, Visual Disturbances and Yellow  Eyes. Respiratory Not Present- Bloody sputum, Chronic Cough, Difficulty Breathing, Snoring and Wheezing. Breast Present- Breast Mass and Breast Pain. Not Present- Nipple Discharge and Skin Changes. Cardiovascular Present- Rapid Heart Rate and Swelling of Extremities. Not Present- Chest Pain, Difficulty Breathing Lying Down, Leg Cramps, Palpitations and Shortness of Breath. Gastrointestinal Present- Bloating, Chronic diarrhea, Difficulty Swallowing, Excessive gas, Hemorrhoids, Indigestion and Rectal Pain. Not Present- Abdominal Pain, Bloody Stool, Change in Bowel Habits, Constipation, Gets full quickly at meals, Nausea and Vomiting. Female Genitourinary Present- Frequency and Urgency. Not Present- Nocturia, Painful Urination and Pelvic Pain. Musculoskeletal Present- Back Pain, Joint Pain, Joint Stiffness, Muscle Pain and Swelling of Extremities. Not Present- Muscle Weakness. Neurological Present- Headaches, Numbness, Tremor and Weakness. Not Present- Decreased Memory, Fainting, Seizures, Tingling and Trouble walking. Psychiatric Present- Anxiety, Change in Sleep Pattern and Depression. Not Present- Bipolar, Fearful and Frequent crying. Endocrine Present- Hair Changes, Hot flashes and New Diabetes. Not Present- Cold Intolerance, Excessive Hunger and Heat Intolerance. Hematology Present- Easy Bruising and Gland problems. Not Present- Excessive bleeding, HIV and Persistent Infections.  Vitals Kelly Kennedy CMA; 04/13/2015 10:43 AM) 04/13/2015 10:43 AM Weight: 193 lb Height: 63in Body Surface Area: 1.97 m Body Mass Index: 34.19 kg/m Temp.: 98.39F(Oral)  Pulse: 80 (Regular)  Resp.: 17 (Unlabored)  BP: 138/80 (Sitting, Left Arm, Standard)     Physical Exam (Tallula Grindle A. Johnpatrick Jenny Kennedy; 04/13/2015 11:44 AM)  General Mental Status-Alert. General Appearance-Consistent with stated age. Hydration-Well hydrated. Voice-Normal.  Head and Neck Head-normocephalic, atraumatic with no  lesions or palpable masses. Trachea-midline. Thyroid Gland Characteristics - normal size and consistency.  Eye Eyeball - Bilateral-Extraocular movements intact. Sclera/Conjunctiva - Bilateral-No scleral icterus.  Chest and Lung Exam Chest and lung exam reveals -quiet, even and easy respiratory effort with no use of accessory muscles and on auscultation, normal breath sounds, no adventitious sounds and normal vocal resonance. Inspection Chest Wall - Normal. Back - normal.  Breast Breast - Left-Symmetric, Non Tender, No Biopsy scars, no Dimpling, No Inflammation, No Lumpectomy scars, No Mastectomy scars, No Peau d' Orange. Breast - Right-Symmetric, Non Tender, No Biopsy scars, no Dimpling, No Inflammation, No Lumpectomy scars, No Mastectomy scars, No Peau d' Orange. Breast Lump-No Palpable Breast Mass. Note: bruising left breast noted.   Cardiovascular Cardiovascular examination reveals -normal heart sounds, regular rate and rhythm with no murmurs and normal pedal pulses bilaterally.  Abdomen  Inspection Inspection of the abdomen reveals - No Hernias. Skin - Scar - no surgical scars. Palpation/Percussion Palpation and Percussion of the abdomen reveal - Soft, Non Tender, No Rebound tenderness, No Rigidity (guarding) and No hepatosplenomegaly. Auscultation Auscultation of the abdomen reveals - Bowel sounds normal.  Neurologic Neurologic evaluation reveals -alert and oriented x 3 with no impairment of recent or remote memory. Mental Status-Normal.  Musculoskeletal Normal Exam - Left-Upper Extremity Strength Normal and Lower Extremity Strength Normal. Normal Exam - Right-Upper Extremity Strength Normal and Lower Extremity Strength Normal.  Lymphatic Head & Neck  General Head & Neck Lymphatics: Bilateral - Description - Normal. Axillary  General Axillary Region: Bilateral - Description - Normal. Tenderness - Non Tender. Femoral & Inguinal  Generalized  Femoral & Inguinal Lymphatics: Bilateral - Description - Normal. Tenderness - Non Tender.    Assessment & Plan (Gregory Barrick A. Jalik Gellatly Kennedy; 04/13/2015 11:22 AM)  DCIS (DUCTAL CARCINOMA IN SITU), LEFT (233.0  D05.12) Impression: small area low to intermediate grade discussed medical and surgical management and role of radiatio therapy. Pt would like to have left partial mastectomy. Risk of partial mastectomy include bleeding, infection, seroma, more surgery, use of seed/wire, wound care, cosmetic deformity and the need for other treatments, death , blood clots, death. Pt agrees to proceed.  Current Plans Pt Education - [facility] Breast Biopsy [facility]

## 2015-05-06 NOTE — Interval H&P Note (Signed)
History and Physical Interval Note:  05/06/2015 12:25 PM  Kelly Kennedy  has presented today for surgery, with the diagnosis of DCIS Left Breast  The various methods of treatment have been discussed with the patient and family. After consideration of risks, benefits and other options for treatment, the patient has consented to  Procedure(s): LEFT BREAST PARTIAL MASTECTOMY WITH NEEDLE LOCALIZATION AND SENTINEL LYMPH NODE MAPPING (Left) as a surgical intervention .  The patient's history has been reviewed, patient examined, no change in status, stable for surgery.  I have reviewed the patient's chart and labs.  Questions were answered to the patient's satisfaction.     Rogen Porte A.

## 2015-05-06 NOTE — Transfer of Care (Signed)
Immediate Anesthesia Transfer of Care Note  Patient: Kelly Kennedy  Procedure(s) Performed: Procedure(s): LEFT BREAST PARTIAL MASTECTOMY WITH NEEDLE LOCALIZATION AND SENTINEL LYMPH NODE MAPPING (Left)  Patient Location: PACU  Anesthesia Type:GA combined with regional for post-op pain  Level of Consciousness: sedated  Airway & Oxygen Therapy: Patient Spontanous Breathing and Patient connected to face mask oxygen  Post-op Assessment: Report given to RN and Post -op Vital signs reviewed and stable  Post vital signs: Reviewed and stable  Last Vitals:  Filed Vitals:   05/06/15 1409  BP:   Pulse: 94  Temp:   Resp: 15    Complications: No apparent anesthesia complications

## 2015-05-06 NOTE — Anesthesia Preprocedure Evaluation (Signed)
Anesthesia Evaluation  Patient identified by MRN, date of birth, ID band Patient awake    Reviewed: Allergy & Precautions, NPO status , Patient's Chart, lab work & pertinent test results  Airway Mallampati: I  TM Distance: >3 FB Neck ROM: Full    Dental  (+) Teeth Intact, Dental Advisory Given   Pulmonary former smoker,  breath sounds clear to auscultation        Cardiovascular hypertension, Pt. on medications Rhythm:Regular Rate:Normal     Neuro/Psych    GI/Hepatic GERD-  Medicated and Controlled,  Endo/Other  diabetes, Well Controlled, Type 2, Oral Hypoglycemic AgentsMorbid obesity  Renal/GU      Musculoskeletal   Abdominal   Peds  Hematology   Anesthesia Other Findings   Reproductive/Obstetrics                             Anesthesia Physical Anesthesia Plan  ASA: III  Anesthesia Plan: General and Regional   Post-op Pain Management:    Induction: Intravenous  Airway Management Planned: LMA  Additional Equipment:   Intra-op Plan:   Post-operative Plan: Extubation in OR  Informed Consent: I have reviewed the patients History and Physical, chart, labs and discussed the procedure including the risks, benefits and alternatives for the proposed anesthesia with the patient or authorized representative who has indicated his/her understanding and acceptance.   Dental advisory given  Plan Discussed with: CRNA, Anesthesiologist and Surgeon  Anesthesia Plan Comments:         Anesthesia Quick Evaluation

## 2015-05-07 ENCOUNTER — Encounter (HOSPITAL_BASED_OUTPATIENT_CLINIC_OR_DEPARTMENT_OTHER): Payer: Self-pay | Admitting: Surgery

## 2015-05-07 NOTE — Addendum Note (Signed)
Addendum  created 05/07/15 0950 by Tawni Millers, CRNA   Modules edited: Charges VN

## 2015-05-12 ENCOUNTER — Ambulatory Visit (INDEPENDENT_AMBULATORY_CARE_PROVIDER_SITE_OTHER): Payer: Commercial Managed Care - HMO | Admitting: Family Medicine

## 2015-05-12 ENCOUNTER — Encounter: Payer: Self-pay | Admitting: Family Medicine

## 2015-05-12 VITALS — BP 120/76 | HR 104 | Temp 98.3°F | Wt 188.5 lb

## 2015-05-12 DIAGNOSIS — E119 Type 2 diabetes mellitus without complications: Secondary | ICD-10-CM | POA: Diagnosis not present

## 2015-05-12 DIAGNOSIS — C50919 Malignant neoplasm of unspecified site of unspecified female breast: Secondary | ICD-10-CM | POA: Diagnosis not present

## 2015-05-12 DIAGNOSIS — E1159 Type 2 diabetes mellitus with other circulatory complications: Secondary | ICD-10-CM

## 2015-05-12 LAB — HEMOGLOBIN A1C: HEMOGLOBIN A1C: 6.7 % — AB (ref 4.6–6.5)

## 2015-05-12 NOTE — Progress Notes (Signed)
Pre visit review using our clinic review tool, if applicable. No additional management support is needed unless otherwise documented below in the visit note.  She is going to have more breast surgery, d/w pt.  She is awaiting call back from surgery about her next appointment.  Pain is manageable per patient.  She is off her HRT patch.    Diabetes:  Using medications without difficulties: no Hypoglycemic episodes: no sx Hyperglycemic episodes: no sx Feet problems: ankle trouble at baseline but no tingling.   Blood Sugars averaging:not checked.  eye exam within last year:yes Due for A1c.   Meds, vitals, and allergies reviewed.   ROS: See HPI.  Otherwise negative.    GEN: nad, alert and oriented HEENT: mucous membranes moist NECK: supple w/o LA CV: rrr. PULM: ctab, no inc wob ABD: soft, +bs EXT: no edema Drain in place on L chest wall.

## 2015-05-12 NOTE — Patient Instructions (Addendum)
Go to the lab on the way out.  We'll contact you with your lab report. Take care.  Glad to see you.  

## 2015-05-14 ENCOUNTER — Other Ambulatory Visit: Payer: Self-pay | Admitting: Family Medicine

## 2015-05-14 DIAGNOSIS — Z853 Personal history of malignant neoplasm of breast: Secondary | ICD-10-CM | POA: Insufficient documentation

## 2015-05-14 NOTE — Assessment & Plan Note (Signed)
She has surgery f/u pending.  App surgery help.

## 2015-05-14 NOTE — Assessment & Plan Note (Signed)
A1c improved.  Recheck in about 3-4 months, after she has had her f/u with surgery.

## 2015-05-15 NOTE — Telephone Encounter (Signed)
Electronic refill request. Last Filled:    30 tablet 0 RF on 02/06/2015  Please advise.

## 2015-05-17 NOTE — Telephone Encounter (Signed)
Please call in.  Thanks.   

## 2015-05-18 NOTE — Telephone Encounter (Signed)
Medication phoned to pharmacy.  

## 2015-05-22 ENCOUNTER — Ambulatory Visit: Payer: Self-pay | Admitting: Surgery

## 2015-05-22 DIAGNOSIS — Z9889 Other specified postprocedural states: Secondary | ICD-10-CM | POA: Diagnosis not present

## 2015-05-22 NOTE — H&P (Signed)
Kelly Kennedy 05/22/2015 8:55 AM Location: [facility] Patient #: [id number] DOB: 01-19-1941 Widowed / Language: Undefined / Race: Undefined Female History of Present Illness Kelly Kennedy A. Kelly Marczak MD; 05/22/2015 12:26 PM) Patient words: po lumpectomy  PT RETURNS 2 WEEKS AFTER LEFT BREAST LUMPECTOMY FOR dcis 7.8 CM IN SIZE WITH CLOSE MARGINS ANTERIOR AND POSTERIOR BEING LESS THAN 1 MM. sHE HAS A DRAIN INPLACE AS WELL. sHE IS DOING WELL.                Diagnosis 1. Breast, partial mastectomy, Left - INTERMEDIATE GRADE DUCTAL CARCINOMA IN SITU WITH ASSOCIATED CALCIFICATIONS, SPANNING 7.9 CM IN GREATEST DIMENSION. - DUCTAL CARCINOMA IN SITU IS EXTREMELY CLOSE (LESS THAN 0.01CM) TO ANTERIOR AND POSTERIOR MARGINS. - DUCTAL CARCINOMA IN SITU IS CLOSE (LESS THAN 0.1 CM) TO LATERAL MARGIN. - DUCTAL CARCINOMA IN SITU IS 0.2 CM FROM SUPERIOR MARGIN. - DUCTAL CARCINOMA IN SITU IS 0.25 CM TO INFERIOR MARGIN. - MEDIAL MARGIN IS NEGATIVE. - SEE ONCOLOGY TEMPLATE 2. Lymph node, sentinel, biopsy, Left axilla - ONE BENIGN LYMPH NODE WITH NO TUMOR SEEN (0/1). 3. Lymph node, sentinel, biopsy, Left axilla - ONE BENIGN LYMPH NODE WITH NO TUMOR SEEN (0/1). Microscopic Comment 1. BREAST, IN SITU CARCINOMA Specimen, including laterality: Left partial breast with left sentinel lymph node sampling. Procedure (include lymph node sampling sentinel-non-sentinel): Left partial mastectomy with left sentinel lymph node biopsies. Grade of carcinoma: Intermediate grade. Necrosis: Necrosis was identified on previous biopsy ([id number]). Estimated tumor size: (gross measurement): 7.9 cm. Treatment effect: Not applicable. Distance to closest margin: Tumor is extremely close (less than 0.01 cm) to anterior and posterior margins; tumor is also close (less than 0.1 cm) to lateral margin. Breast prognostic profile: Performed on previous case [phone]. Estrogen receptor: 100%,  positive. Progesterone receptor: 98%, positive. Lymph nodes: Examined: 2 Sentinel. 0 Non-sentinel. 1 of 3 FINAL for Kelly Kennedy ([id number]) Microscopic Comment(continued) 2 Total. Lymph nodes with metastasis: 0. Isolated tumor cells (< 0.2 mm): 0. Micrometastasis ( > 0.2 mm and < 2.0 mm): 0. Macrometastasis (> 2.0 mm): 0. Extranodal extension: Not applicable. TNM: pTis, pN0. Comments: In addition to the ductal carcinoma in situ, fibrocystic changes with usual ductal hyperplasia and fibroadenoma formation is present. (RH:ds 05/08/15) Kelly Niece MD Pathologist, Electronic Signature (Case signed 05/08/2015) Specimen Gross and Clinical Information Specimen(s) Obtained: 1. Breast, partial mastectomy, Left 2. Lymph node, sentinel, biopsy, Left axilla 3. Lymph node, sentinel, biopsy, Left axilla Specimen Clinical Information.  The patient is a 74 year old female   Allergies [name], CMA; 05/22/2015 8:55 AM) Niacin *VITAMINS* Rash.  Medication History [name], CMA; 05/22/2015 8:56 AM) TraMADol HCl (50MG  Tablet, Oral) Active. Amitriptyline HCl (50MG  Tablet, Oral) Active. Amoxicillin (875MG  Tablet, Oral) Active. Combigan (0.2-0.5% Solution, Ophthalmic) Active. Azithromycin (250MG  Tablet, Oral) Active. Crestor (10MG  Tablet, Oral) Active. Durezol (0.05% Emulsion, Ophthalmic) Active. Estradiol (0.1MG /24HR Patch Weekly, Transdermal) Active. Fluconazole (150MG  Tablet, Oral) Active. Fluocinonide (0.05% Cream, External) Active. Furosemide (20MG  Tablet, Oral) Active. GlipiZIDE (5MG  Tablet, Oral) Active. Ketorolac Tromethamine (0.4% Solution, Ophthalmic) Active. Klor-Con 10 (10MEQ Tablet ER, Oral) Active. Losartan Potassium (100MG  Tablet, Oral) Active. Levothyroxine Sodium (100MCG Tablet, Oral) Active. Magnesium Oxide (400 (241.3 Mg)MG Tablet, Oral) Active. Nitrostat (0.4MG  Tab Sublingual, Sublingual) Active. Ofloxacin (0.3% Solution,  Ophthalmic) Active. Voltaren (1% Gel, Transdermal) Active. Refresh (1% Solution, Ophthalmic) Active. Systane Balance (0.6% Solution, Ophthalmic) Active. Medications Reconciled    Vitals [name] CMA; 05/22/2015 8:56 AM) 05/22/2015 8:56 AM Weight: 189 lb Height: 63in Body Surface Area:  1.95 m Body Mass Index: 33.48 kg/m Temp.: 98.79F(Oral)  Pulse: 66 (Regular)  BP: 130/70 (Sitting, Left Arm, Standard)     Physical Exam (Wilba Mutz A. Vegas Coffin MD; 05/22/2015 12:26 PM)  Breast Note: DRAIN REMOVED MODERATED VOLUME LOSS LEFT BREAST NO REDNESS OR DRAINAGE     Assessment & Plan (Dailey Buccheri A. Jarreau Callanan MD; 05/22/2015 12:27 PM)  POST-OPERATIVE STATE (V45.89  Z98.89) Impression: pt desires left NSM since reexcision recommended and cosmesis an issue now. Her sister had breast cancer and HAD mastectomy with reconstruction and has seen Dr Harlow Mares. Will refer to Dr Harlow Mares for reconstruction options but pt wants to keep her nipple which is possible. Explained the nipple may die with this surgery. Risk of bleeding infection flap loss the need for other surgery discussed. Discussed treatment options for breast cancer to include breast conservation vs mastectomy with reconstruction. Pt has decided on mastectomy. Risk include bleeding, infection, flap necrosis, pain, numbness, recurrence, hematoma, other surgery needs. Pt understands and agrees to proceed.  Current Plans Pt Education - [facility] Free Text Education/Instructions: discussed with patient and provided information.

## 2015-05-28 DIAGNOSIS — D0512 Intraductal carcinoma in situ of left breast: Secondary | ICD-10-CM | POA: Diagnosis not present

## 2015-05-29 ENCOUNTER — Telehealth: Payer: Self-pay | Admitting: *Deleted

## 2015-05-29 NOTE — Telephone Encounter (Signed)
Received referral from [facility].  Called & left a message for the pt to return my call so I can schedule a med onc appt.

## 2015-06-02 ENCOUNTER — Telehealth: Payer: Self-pay | Admitting: *Deleted

## 2015-06-02 NOTE — Telephone Encounter (Signed)
Pt returned my call and I confirmed 06/10/15 appt w/ her.  Mailed before appt letter, calendar, welcoming packet & intake form to pt.  Emailed Engineer, civil (consulting) at [employer] to make her aware.  Placed a copy of records in Dr. Virgie Dad box and took one to HIM to scan.

## 2015-06-03 ENCOUNTER — Telehealth: Payer: Self-pay | Admitting: *Deleted

## 2015-06-03 NOTE — Telephone Encounter (Signed)
Called pt's PCP and spoke with Ebony Hail and she gave me approved Auth # [id number] from 06/03/15 - 11/30/15 or for 6 visits for the pt's insurance.

## 2015-06-04 ENCOUNTER — Ambulatory Visit: Payer: Self-pay | Admitting: Surgery

## 2015-06-04 NOTE — H&P (Signed)
Kelly Kennedy 05/22/2015 8:55 AM Location: [facility] Patient #: [id number] DOB: 08-Nov-1941 Widowed / Language: Undefined / Race: Undefined Female  History of Present Illness Kelly Moores A.  Cohick MD; 05/22/2015 12:26 PM) Patient words: po lumpectomy  PT RETURNS 2 WEEKS AFTER LEFT BREAST LUMPECTOMY FOR dcis 7.8 CM IN SIZE WITH CLOSE MARGINS ANTERIOR AND POSTERIOR BEING LESS THAN 1 MM. sHE HAS A DRAIN INPLACE AS WELL. sHE IS DOING WELL.                Diagnosis 1. Breast, partial mastectomy, Left - INTERMEDIATE GRADE DUCTAL CARCINOMA IN SITU WITH ASSOCIATED CALCIFICATIONS, SPANNING 7.9 CM IN GREATEST DIMENSION. - DUCTAL CARCINOMA IN SITU IS EXTREMELY CLOSE (LESS THAN 0.01CM) TO ANTERIOR AND POSTERIOR MARGINS. - DUCTAL CARCINOMA IN SITU IS CLOSE (LESS THAN 0.1 CM) TO LATERAL MARGIN. - DUCTAL CARCINOMA IN SITU IS 0.2 CM FROM SUPERIOR MARGIN. - DUCTAL CARCINOMA IN SITU IS 0.25 CM TO INFERIOR MARGIN. - MEDIAL MARGIN IS NEGATIVE. - SEE ONCOLOGY TEMPLATE 2. Lymph node, sentinel, biopsy, Left axilla - ONE BENIGN LYMPH NODE WITH NO TUMOR SEEN (0/1). 3. Lymph node, sentinel, biopsy, Left axilla - ONE BENIGN LYMPH NODE WITH NO TUMOR SEEN (0/1). Microscopic Comment 1. BREAST, IN SITU CARCINOMA Specimen, including laterality: Left partial breast with left sentinel lymph node sampling. Procedure (include lymph node sampling sentinel-non-sentinel): Left partial mastectomy with left sentinel lymph node biopsies. Grade of carcinoma: Intermediate grade. Necrosis: Necrosis was identified on previous biopsy ([id number]). Estimated tumor size: (gross measurement): 7.9 cm. Treatment effect: Not applicable. Distance to closest margin: Tumor is extremely close (less than 0.01 cm) to anterior and posterior margins; tumor is also close (less than 0.1 cm) to lateral margin. Breast prognostic profile: Performed on previous case [id number]. Estrogen receptor: 100%,  positive. Progesterone receptor: 98%, positive. Lymph nodes: Examined: 2 Sentinel. 0 Non-sentinel. 1 of 3 FINAL for Kelly Kennedy ([id number]) Microscopic Comment(continued) 2 Total. Lymph nodes with metastasis: 0. Isolated tumor cells (< 0.2 mm): 0. Micrometastasis ( > 0.2 mm and < 2.0 mm): 0. Macrometastasis (> 2.0 mm): 0. Extranodal extension: Not applicable. TNM: pTis, pN0. Comments: In addition to the ductal carcinoma in situ, fibrocystic changes with usual ductal hyperplasia and fibroadenoma formation is present. (RH:ds 05/08/15) Kelly Niece MD Pathologist, Electronic Signature (Case signed 05/08/2015) Specimen Gross and Clinical Information Specimen(s) Obtained: 1. Breast, partial mastectomy, Left 2. Lymph node, sentinel, biopsy, Left axilla 3. Lymph node, sentinel, biopsy, Left axilla Specimen Clinical Information.  The patient is a 74 year old female    Allergies Kelly Kennedy, CMA; 05/22/2015 8:55 AM) Niacin *VITAMINS* Rash.  Medication History Kelly Kennedy, CMA; 05/22/2015 8:56 AM) TraMADol HCl (50MG  Tablet, Oral) Active. Amitriptyline HCl (50MG  Tablet, Oral) Active. Amoxicillin (875MG  Tablet, Oral) Active. Combigan (0.2-0.5% Solution, Ophthalmic) Active. Azithromycin (250MG  Tablet, Oral) Active. Crestor (10MG  Tablet, Oral) Active. Durezol (0.05% Emulsion, Ophthalmic) Active. Estradiol (0.1MG /24HR Patch Weekly, Transdermal) Active. Fluconazole (150MG  Tablet, Oral) Active. Fluocinonide (0.05% Cream, External) Active. Furosemide (20MG  Tablet, Oral) Active. GlipiZIDE (5MG  Tablet, Oral) Active. Ketorolac Tromethamine (0.4% Solution, Ophthalmic) Active. Klor-Con 10 (10MEQ Tablet ER, Oral) Active. Losartan Potassium (100MG  Tablet, Oral) Active. Levothyroxine Sodium (100MCG Tablet, Oral) Active. Magnesium Oxide (400 (241.3 Mg)MG Tablet, Oral) Active. Nitrostat (0.4MG  Tab Sublingual, Sublingual) Active. Ofloxacin (0.3% Solution,  Ophthalmic) Active. Voltaren (1% Gel, Transdermal) Active. Refresh (1% Solution, Ophthalmic) Active. Systane Balance (0.6% Solution, Ophthalmic) Active. Medications Reconciled  Vitals Kelly Kennedy CMA; 05/22/2015 8:56 AM) 05/22/2015 8:56 AM Weight: 189 lb Height: 63in Body Surface Area:  1.95 m Body Mass Index: 33.48 kg/m Temp.: 98.67F(Oral)  Pulse: 66 (Regular)  BP: 130/70 (Sitting, Left Arm, Standard)    Physical Exam (Flavia Bruss A. Greenley Martone MD; 05/22/2015 12:26 PM) Breast Note: DRAIN REMOVED MODERATED VOLUME LOSS LEFT BREAST NO REDNESS OR DRAINAGE     Assessment & Plan (Miroslav Gin A. Sharmel Ballantine MD; 05/22/2015 12:27 PM) POST-OPERATIVE STATE (V45.89  Z98.89) Impression: pt desires left NSM since reexcision recommended and cosmesis an issue now. Her sister had breast cancer and HAD mastectomy with reconstruction and has seen Dr Harlow Mares. Will refer to Dr Harlow Mares for reconstruction options but pt wants to keep her nipple which is possible. Explained the nipple may die with this surgery. Risk of bleeding infection flap loss the need for other surgery discussed. Discussed treatment options for breast cancer to include breast conservation vs mastectomy with reconstruction. Pt has decided on mastectomy. Risk include bleeding, infection, flap necrosis, pain, numbness, recurrence, hematoma, other surgery needs. Pt understands and agrees to proceed. Current Plans  Pt Education - [facility] Free Text Education/Instructions: discussed with patient and provided information.   Signed by [name], MD (05/22/2015 12:27 PM)

## 2015-06-09 ENCOUNTER — Other Ambulatory Visit: Payer: Self-pay | Admitting: *Deleted

## 2015-06-09 DIAGNOSIS — C50919 Malignant neoplasm of unspecified site of unspecified female breast: Secondary | ICD-10-CM

## 2015-06-10 ENCOUNTER — Ambulatory Visit (HOSPITAL_BASED_OUTPATIENT_CLINIC_OR_DEPARTMENT_OTHER): Payer: Commercial Managed Care - HMO | Admitting: Oncology

## 2015-06-10 ENCOUNTER — Ambulatory Visit: Payer: Commercial Managed Care - HMO

## 2015-06-10 ENCOUNTER — Other Ambulatory Visit (HOSPITAL_BASED_OUTPATIENT_CLINIC_OR_DEPARTMENT_OTHER): Payer: Commercial Managed Care - HMO

## 2015-06-10 VITALS — BP 148/75 | HR 91 | Temp 98.1°F | Resp 18 | Ht 63.0 in | Wt 187.4 lb

## 2015-06-10 DIAGNOSIS — D0512 Intraductal carcinoma in situ of left breast: Secondary | ICD-10-CM | POA: Diagnosis not present

## 2015-06-10 DIAGNOSIS — Z170 Estrogen receptor positive status [ER+]: Secondary | ICD-10-CM | POA: Diagnosis not present

## 2015-06-10 DIAGNOSIS — C50912 Malignant neoplasm of unspecified site of left female breast: Secondary | ICD-10-CM

## 2015-06-10 DIAGNOSIS — C50919 Malignant neoplasm of unspecified site of unspecified female breast: Secondary | ICD-10-CM

## 2015-06-10 DIAGNOSIS — C50412 Malignant neoplasm of upper-outer quadrant of left female breast: Secondary | ICD-10-CM | POA: Insufficient documentation

## 2015-06-10 LAB — COMPREHENSIVE METABOLIC PANEL (CC13)
ALT: 31 U/L (ref 0–55)
AST: 30 U/L (ref 5–34)
Albumin: 3.7 g/dL (ref 3.5–5.0)
Alkaline Phosphatase: 66 U/L (ref 40–150)
Anion Gap: 8 mEq/L (ref 3–11)
BILIRUBIN TOTAL: 0.3 mg/dL (ref 0.20–1.20)
BUN: 16.6 mg/dL (ref 7.0–26.0)
CHLORIDE: 107 meq/L (ref 98–109)
CO2: 26 mEq/L (ref 22–29)
Calcium: 9.3 mg/dL (ref 8.4–10.4)
Creatinine: 1.3 mg/dL — ABNORMAL HIGH (ref 0.6–1.1)
EGFR: 48 mL/min/{1.73_m2} — AB (ref >90)
Glucose: 111 mg/dl (ref 70–140)
POTASSIUM: 3.8 meq/L (ref 3.5–5.1)
SODIUM: 141 meq/L (ref 136–145)
Total Protein: 7 g/dL (ref 6.4–8.3)

## 2015-06-10 LAB — CBC WITH DIFFERENTIAL/PLATELET
BASO%: 0.5 % (ref 0.0–2.0)
Basophils Absolute: 0 10*3/uL (ref 0.0–0.1)
EOS ABS: 0 10*3/uL (ref 0.0–0.5)
EOS%: 0.3 % (ref 0.0–7.0)
HEMATOCRIT: 36.3 % (ref 34.8–46.6)
HEMOGLOBIN: 12 g/dL (ref 11.6–15.9)
LYMPH%: 40.4 % (ref 14.0–49.7)
MCH: 29 pg (ref 25.1–34.0)
MCHC: 33 g/dL (ref 31.5–36.0)
MCV: 87.9 fL (ref 79.5–101.0)
MONO#: 0.4 10*3/uL (ref 0.1–0.9)
MONO%: 6.8 % (ref 0.0–14.0)
NEUT#: 3.3 10*3/uL (ref 1.5–6.5)
NEUT%: 52 % (ref 38.4–76.8)
Platelets: 324 10*3/uL (ref 145–400)
RBC: 4.13 10*6/uL (ref 3.70–5.45)
RDW: 13.9 % (ref 11.2–14.5)
WBC: 6.4 10*3/uL (ref 3.9–10.3)
lymph#: 2.6 10*3/uL (ref 0.9–3.3)

## 2015-06-10 NOTE — Progress Notes (Signed)
[facility]  Telephone:([phone]) [phone] Fax:(336) [phone]     ID: Kelly Kennedy DOB: 21-Dec-1940  MR#: [id number]  TUU#:[id number]  Patient Care Team: Kelly Ghent, MD as PCP - General PCP: Kelly Stain, MD GYN: SU: Kelly Luna MD OTHER MD:  CHIEF COMPLAINT: Ductal carcinoma in situ  CURRENT TREATMENT: Awaiting mastectomy with implant reconstruction   BREAST CANCER HISTORY: "Kelly Kennedy" had routine screening mammography with tomography at the breast Center for 20 08/18/2015 showing possible new calcifications in the left breast. Left diagnostic mammography on 04/02/2015 showed pleomorphic calcifications measuring up to 8 mm in the upper outer quadrant. Biopsy of this area on 04/07/2015 showed ([id number]) ductal carcinoma in situ, low to intermediate grade, estrogen receptor 97% positive, progesterone receptor 97% positive, both with strong staining intensity.  A second group of calcifications, more anterior, also in the upper outer quadrant, was biopsied 04/22/2015 showing ([id number]) ductal carcinoma in situ, grade 2, estrogen receptor 100% positive, progesterone receptor 98% positive, both with strong staining intensity.  On 05/06/2015 the patient underwent left partial mastectomy which showed intermediate grade ductal carcinoma in situ extending over 7.9 cm. Both sentinel lymph nodes were clear. Margins were less than a millimeter anteriorly and posteriorly as well as laterally.  Given the many exceedingly close margins and the cosmetic defect already present, the patient has opted for left mastectomy with immediate reconstruction.   Her subsequent history is as detailed below.  INTERVAL HISTORY: Kelly Kennedy was evaluated in the breast clinic 06/10/2015 accompanied by her sister Kelly Kennedy and her cousin Kelly Kennedy.  REVIEW OF SYSTEMS: There were no specific symptoms leading to the original mammogram, which was routinely scheduled. The patient denies unusual  headaches, visual changes, nausea, vomiting, stiff neck, dizziness, or gait imbalance. There has been a dry cough, but no phlegm production, or pleurisy, no chest pain or pressure, and no change in bowel or bladder habits. The patient denies fever, rash, bleeding, unexplained fatigue or unexplained weight loss. The patient admits to occasional sinus headaches, reflux symptoms, history of an irregular heartbeat, occasional ankle swelling, occasional abdominal cramps, stress urinary incontinence, and chronic back and joint pain which is not more persistent or intense than usual. She is depressed, feels forgetful at times, but denies anxiety. She has significant hot flashes. A detailed review of systems was otherwise entirely negative.  PAST MEDICAL HISTORY: Past Medical History  Diagnosis Date  . Hyperlipidemia   . Diverticulosis of colon 06/2005  . Hypothyroid   . Insomnia   . Menopausal symptoms   . Hypertension   . Hyperglycemia   . adenomatous Colon polyps     colonoscopy 8/99, 12/02, 8/06  . Pericarditis 1980's  . Chest pain     uses NTG as needed  . Depression   . Bell's palsy   . GERD (gastroesophageal reflux disease)   . H/O hiatal hernia   . Headache(784.0)   . Arthritis   . Irritable bowel   . Diabetes mellitus without complication   . TIA (transient ischemic attack)     09/2014  . Ductal carcinoma in situ (DCIS) of left breast   . Cancer     DCIS left breast    PAST SURGICAL HISTORY: Past Surgical History  Procedure Laterality Date  . Partial hysterectomy      ovaries intact  . Cholecystectomy  1990  . Ectopic pregnancy surgery  1971  . Cystectomy  05/04/08    left lower arm, benign  . Tonsillectomy    .  Appendectomy    . Cardiac catheterization    . Fracture surgery      nose  . Tubal ligation    . Orif ankle fracture Left 10/23/2013    Procedure: OPEN REDUCTION INTERNAL FIXATION (ORIF) LEFT ANKLE FRACTURE;  Surgeon: Kelly Payment, MD;  Location: [facility];   Service: Orthopedics;  Laterality: Left;  . Cataract extraction Bilateral 2016  . Partial mastectomy with needle localization and axillary sentinel lymph node bx Left 05/06/2015    Procedure: LEFT BREAST PARTIAL MASTECTOMY WITH NEEDLE LOCALIZATION AND SENTINEL LYMPH NODE MAPPING;  Surgeon: Kelly Luna, MD;  Location: [facility];  Service: General;  Laterality: Left;    FAMILY HISTORY Family History  Problem Relation Age of Onset  . Cancer Father     stomach   . Heart disease Son     MI  . Arthritis Mother   . Diabetes Mother   . Colon cancer Neg Hx   . Pancreatic cancer Neg Hx   . Stomach cancer Neg Hx   . Breast cancer Neg Hx    the patient's father died from stomach cancer at age 12. The patient's mother died from gangrene at the age of 88. The patient has one sister, 9 brothers. Her sister had breast cancer diagnosed after the age of 74. There is no history of ovarian cancer in the family.  GYNECOLOGIC HISTORY:  No LMP recorded. Patient has had a hysterectomy. Menarche age 80, first live birth age 29, the patient is GX P3. She underwent hysterectomy more than 20 years ago. She does believe her ovaries were removed at that time. She was on hormone replacement until earlier this year.  SOCIAL HISTORY:  She used to work in a [employer] but is now retired. She is widowed. At home she lives with her sister Kelly Kennedy and her son Kelly Kennedy, who works in Theatre manager. The patient's daughter Kelly Kennedy is currently unemployed. The patient's youngest son died from heart problems at age 17. The patient has 4 grandchildren and at least 76 great-grandchildren. She attends a [employer] locally    ADVANCED DIRECTIVES: Not in place   HEALTH MAINTENANCE: History  Substance Use Topics  . Smoking status: Former Research scientist (life sciences)  . Smokeless tobacco: Former Systems developer  . Alcohol Use: No     Colonoscopy: 09/09/2010, 09/26/2013  PAP:  Bone density: 2013?/Normal per patient report  Lipid  panel:  Allergies  Allergen Reactions  . Niacin Rash  . Red Dye Rash    Current Outpatient Prescriptions  Medication Sig Dispense Refill  . amitriptyline (ELAVIL) 50 MG tablet Take 1-2 tablets (50-100 mg total) by mouth at bedtime as needed for sleep. 180 tablet 3  . aspirin EC 81 MG tablet Take 81 mg by mouth daily.    . CRESTOR 10 MG tablet TAKE 1 TABLET BY MOUTH EVERY DAY 30 tablet 10  . diclofenac sodium (VOLTAREN) 1 % GEL Apply topically. Apply 2-4 Grams to affected area twice a day    . fluocinonide cream (LIDEX) 0.05 % APPLY TOPICALLY DAILY AS NEEDED 30 g 5  . furosemide (LASIX) 20 MG tablet Take 1 tablet (20 mg total) by mouth daily. 90 tablet 3  . glipiZIDE (GLUCOTROL) 2.5 mg TABS tablet Take 0.5 tablets (2.5 mg total) by mouth daily before breakfast. 60 tablet 2  . Hypromellose (GENTEAL OP) Apply 1 application to eye at bedtime.    Marland Kitchen levothyroxine (SYNTHROID, LEVOTHROID) 100 MCG tablet Take one tablet by mouth daily except on Monday  and Wednesday take 1/2 tablet 90 tablet 3  . losartan (COZAAR) 100 MG tablet Take 1 tablet (100 mg total) by mouth daily. To replace lisinopril rx 90 tablet 3  . magnesium oxide (MAG-OX) 400 (241.3 MG) MG tablet TAKE 1 TABLET (400 MG TOTAL) BY MOUTH DAILY. 30 tablet 11  . Multiple Vitamins-Minerals (PRESERVISION AREDS 2) CAPS Take 2 capsules by mouth daily.    . nitroGLYCERIN (NITROSTAT) 0.4 MG SL tablet Place 1 tablet (0.4 mg total) under the tongue every 5 (five) minutes as needed for chest pain. 25 tablet 12  . omeprazole (PRILOSEC OTC) 20 MG tablet Take 20 mg by mouth daily.    Marland Kitchen oxyCODONE-acetaminophen (ROXICET) 5-325 MG per tablet Take 1-2 tablets by mouth every 4 (four) hours as needed. 30 tablet 0  . Polyethyl Glycol-Propyl Glycol (SYSTANE OP) Apply 1 drop to eye at bedtime.    . traMADol (ULTRAM) 50 MG tablet TAKE 1 TABLET BY MOUTH EVERY 8 HOURS AS NEEDED 30 tablet 1   No current facility-administered medications for this visit.     OBJECTIVE: Middle-aged African-American woman who appears stated age 74 Vitals:   06/10/15 1604  BP: 148/75  Pulse: 91  Temp: 98.1 F (36.7 C)  Resp: 18     Body mass index is 33.2 kg/(m^2).    ECOG FS:1 - Symptomatic but completely ambulatory  Ocular: Sclerae unicteric, pupils equal, round and reactive to light Ear-nose-throat: Oropharynx clear and moist Lymphatic: No cervical or supraclavicular adenopathy Lungs no rales or rhonchi, good excursion bilaterally Heart regular rate and rhythm, no murmur appreciated Abd soft, nontender, positive bowel sounds MSK no focal spinal tenderness, no joint edema Neuro: non-focal, well-oriented, appropriate affect Breasts: The right breast is unremarkable. The left breast is status post lumpectomy. There is no evidence of residual disease. The incisions are healing nicely. The left axilla is benign.   LAB RESULTS:  CMP     Component Value Date/Time   NA 141 06/10/2015 1549   NA 139 05/04/2015 1600   K 3.8 06/10/2015 1549   K 4.2 05/04/2015 1600   CL 104 05/04/2015 1600   CO2 26 06/10/2015 1549   CO2 26 05/04/2015 1600   GLUCOSE 111 06/10/2015 1549   GLUCOSE 119* 05/04/2015 1600   GLUCOSE 113* 12/11/2006 1253   BUN 16.6 06/10/2015 1549   BUN 9 05/04/2015 1600   CREATININE 1.3* 06/10/2015 1549   CREATININE 0.81 05/04/2015 1600   CREATININE 0.79 10/08/2012 1750   CALCIUM 9.3 06/10/2015 1549   CALCIUM 9.1 05/04/2015 1600   PROT 7.0 06/10/2015 1549   PROT 6.7 05/04/2015 1600   ALBUMIN 3.7 06/10/2015 1549   ALBUMIN 3.8 05/04/2015 1600   AST 30 06/10/2015 1549   AST 34 05/04/2015 1600   ALT 31 06/10/2015 1549   ALT 33 05/04/2015 1600   ALKPHOS 66 06/10/2015 1549   ALKPHOS 58 05/04/2015 1600   BILITOT 0.30 06/10/2015 1549   BILITOT 0.4 05/04/2015 1600   GFRNONAA >60 05/04/2015 1600   GFRAA >60 05/04/2015 1600    INo results found for: SPEP, UPEP  Lab Results  Component Value Date   WBC 6.4 06/10/2015   NEUTROABS  3.3 06/10/2015   HGB 12.0 06/10/2015   HCT 36.3 06/10/2015   MCV 87.9 06/10/2015   PLT 324 06/10/2015      Chemistry      Component Value Date/Time   NA 141 06/10/2015 1549   NA 139 05/04/2015 1600   K 3.8 06/10/2015 1549  K 4.2 05/04/2015 1600   CL 104 05/04/2015 1600   CO2 26 06/10/2015 1549   CO2 26 05/04/2015 1600   BUN 16.6 06/10/2015 1549   BUN 9 05/04/2015 1600   CREATININE 1.3* 06/10/2015 1549   CREATININE 0.81 05/04/2015 1600   CREATININE 0.79 10/08/2012 1750      Component Value Date/Time   CALCIUM 9.3 06/10/2015 1549   CALCIUM 9.1 05/04/2015 1600   ALKPHOS 66 06/10/2015 1549   ALKPHOS 58 05/04/2015 1600   AST 30 06/10/2015 1549   AST 34 05/04/2015 1600   ALT 31 06/10/2015 1549   ALT 33 05/04/2015 1600   BILITOT 0.30 06/10/2015 1549   BILITOT 0.4 05/04/2015 1600       No results found for: LABCA2  No components found for: LABCA125  No results for input(s): INR in the last 168 hours.  Urinalysis    Component Value Date/Time   COLORURINE YELLOW 09/29/2014 0645   APPEARANCEUR CLEAR 09/29/2014 0645   LABSPEC 1.009 09/29/2014 0645   PHURINE 6.5 09/29/2014 0645   GLUCOSEU NEGATIVE 09/29/2014 0645   HGBUR NEGATIVE 09/29/2014 0645   HGBUR negative 07/16/2010 1521   BILIRUBINUR NEGATIVE 09/29/2014 0645   KETONESUR NEGATIVE 09/29/2014 0645   PROTEINUR NEGATIVE 09/29/2014 0645   UROBILINOGEN 0.2 09/29/2014 0645   NITRITE NEGATIVE 09/29/2014 0645   LEUKOCYTESUR NEGATIVE 09/29/2014 0645    STUDIES: Films reviewed  ASSESSMENT: 74 y.o. [place] woman status post left lumpectomy and sentinel lymph node sampling 05/06/2015 for extensive ductal carcinoma in situ, grade 2, estrogen and progesterone receptor strongly positive, with both sentinel lymph nodes negative, but exceedingly close margins  (1) left nipple sparing mastectomy with immediate reconstruction pending  (2) tamoxifen for breast cancer prevention to start once the patient fully recovers  from her surgery   PLAN: We spent the better part of today's hour-long appointment discussing the biology of breast cancer in general, and the specifics of the patient's tumor in particular. The patient understands that in noninvasive ductal carcinoma, also called ductal carcinoma in situ ("DCIS") the breast cancer cells remain trapped in the ducts were they started. They cannot travel to a vital organ. For that reason these cancers in themselves are not life-threatening.  If the whole breast is removed then all the ducts are removed and since the cancer cells are trapped in the ducts, the cure rate with mastectomy for noninvasive breast cancer is approximately 99%. Nevertheless we recommend lumpectomy, because there is no survival advantage to mastectomy and because the cosmetic result is generally superior with breast conservation.  I in her case however, after 2 biopsies and very extensive surgery, she still has exceedingly close margins and I really think mastectomy is the only choice. Her sister had bilateral mastectomies with bilateral silicone implants and she is generally satisfied with her choice. She does complain of postoperative discomfort which has persisted.  Kelly Kennedy is not considering bilateral mastectomies but is planning to proceed to left mastectomy. She is thinking of silicone implants at this point. I think that is the best plan and she understands that as far as this cancer is concerned no further treatment will be needed.  However she can consider antiestrogen such as tamoxifen for prevention of another cancer in the remaining breast. The risk of that developing is approximately 1/2% per year. That means if she lives 24 more years (which is her estimate) she would have at 12% chance of developing another breast cancer. She would have an 88% chance of NOT  developing another one.  If she took tamoxifen for 5 years the risk would drop to about 6%. That means that 94 out of 100  women like her would get no benefit from tamoxifen.  We then discussed the possible toxicities, side effects and complications of tamoxifen which I think would be a good choice for this patient because she does not have a uterus and she took hormone replacement for more than 20 years with no clotting complications.  After that discussion the PALS patient tells me she is interested in receiving anti-estrogens. We will start that once she has fully recovered from her surgery. Accordingly I am making her a return appointment here for September. If she still is of the same mind we will start tamoxifen then and the plan would be to do that for 5 years, with yearly follow-up here alternating every 6 months with her surgeon  The patient has a good understanding of the overall plan. She agrees with it. She knows the goal of treatment in her case is both cure and prevention. She will call with any problems that may develop before her next visit here.  Chauncey Cruel, MD   06/10/2015 5:28 PM Medical Oncology and Hematology [facility] [address] [place], [place] [address] Tel. [phone]    Fax. [phone]

## 2015-06-11 ENCOUNTER — Telehealth: Payer: Self-pay | Admitting: Oncology

## 2015-06-11 NOTE — Telephone Encounter (Signed)
s.w. pt and advised on Sept appt pt ok and aware °

## 2015-07-08 ENCOUNTER — Other Ambulatory Visit: Payer: Self-pay | Admitting: Family Medicine

## 2015-07-15 ENCOUNTER — Telehealth: Payer: Self-pay

## 2015-07-15 NOTE — Telephone Encounter (Signed)
This could be a legitimate substitution, ie manufacturer change.  Urine color could be from concentration, ie dec in fluids.  Rec inc in water intake.  If the itching continues or if she has a new rash, then we need to check her.  She can take otc claritin 10mg  a day if needed for the itching.  Thanks.

## 2015-07-15 NOTE — Telephone Encounter (Signed)
Pt left v/m; pt is going to have breast removed in 2 weeks; pt was given generic BP med called losartan and pt thinks she usually takes name brand but pt said the name of the namebrand BP med is losartan; Pt was taking long tablet and now pt is taking a soft round pill; explained to pt sometimes if drug manufacturer changes the med is the same but looks different; pt does not think that is the case. pt said generic med has caused her urine to be dark yellow colored and pt does not have rash but is itching on her arms and legs. [email or web address].pt request cb.

## 2015-07-15 NOTE — Telephone Encounter (Addendum)
Patient notified as instructed by telephone and verbalized understanding. Advised patient she can contact her pharmacy and see if they changed manufacturers per Dr. Damita Dunnings. Patient stated that she has contacted her pharmacy and she was told that they no longer make the pill that she was taking. Patient is confused about what the pharmacy told her. Advised patient that I would call the pharmacy and see what is going on. Tried to call the pharmacy and did not get an answer. Called [facility] and was given the number [phone]  and was told that this is a super busy store and to continue to try and call Will have to try again later.

## 2015-07-15 NOTE — Telephone Encounter (Signed)
Please call the pharmacy and see what is going on regarding this patient's medication?

## 2015-07-17 NOTE — Telephone Encounter (Signed)
What about stopping the medicine this weekend and f/u here Monday?  We can check her rash/urine/BP then.  Thanks.

## 2015-07-17 NOTE — Telephone Encounter (Signed)
Patient advised. Appointment scheduled.  

## 2015-07-17 NOTE — Telephone Encounter (Signed)
Spoke with pharmacist at [facility], [place].  Patient was given a 90 day Rx for Losartan on 06/04/15.  The previous manufacturer no longer makes Losartan so it was filled from a different manufacturer so the tablet does look different than previously but the Rx has always been a generic medication.  Patient states the rash is not getting worse since she's using the cream and the Claritin but it is still present and she is very concerned about her urine being dark yellow.  She noted the dark urine being a side effect of the medication.  Both Regina and myself reiterated to her that this could be because of a concentrated urine (i.e dec in fluids) but the patient insists that she is staying well hydrated and that this is not the cause.  Patient asks if she can discontinue the medication until after her surgery?  I advised that was not a good idea because her BP would likely elevate and would possibly intervene her surgery.  Please advise.

## 2015-07-20 ENCOUNTER — Ambulatory Visit (INDEPENDENT_AMBULATORY_CARE_PROVIDER_SITE_OTHER): Payer: Medicare HMO | Admitting: Family Medicine

## 2015-07-20 ENCOUNTER — Encounter: Payer: Self-pay | Admitting: Family Medicine

## 2015-07-20 VITALS — BP 130/82 | HR 94 | Temp 97.9°F | Wt 188.2 lb

## 2015-07-20 DIAGNOSIS — K130 Diseases of lips: Secondary | ICD-10-CM | POA: Diagnosis not present

## 2015-07-20 DIAGNOSIS — R220 Localized swelling, mass and lump, head: Secondary | ICD-10-CM

## 2015-07-20 MED ORDER — AMLODIPINE BESYLATE 5 MG PO TABS: 5.0000 mg | ORAL_TABLET | Freq: Every day | ORAL | Status: DC

## 2015-07-20 NOTE — Progress Notes (Signed)
Pre visit review using our clinic review tool, if applicable. No additional management support is needed unless otherwise documented below in the visit note.  Had been on losartan for a long period of time.  Was doing well until the manufacture change and she had a different pill but same medicine.  Had a rash at that point.  Her lips and tongue got puffy on the medicine, after the pill change.  That is resolved now.  Swallowing well.  Stopped med over the weekend and the rash on the legs and chest improved.  Had been using a salve on the rash with some relief.  No other changes known to cause the sx above.  Her urine is back to a normal color.  Prev with some dysuria, resolved now.  No fevers, ie no temps >100.    Meds, vitals, and allergies reviewed.   ROS: See HPI.  Otherwise, noncontributory.  nad ncat Lips and tongue wnl, not puffy Neck supple, no stridor rrr ctab abd soft Faint blanching rash on the BUE and the upper chest.

## 2015-07-20 NOTE — Patient Instructions (Signed)
Stay off losartan.  If your BP is consistently >140/>90, then start the amlodipine.  If your BP isn't controlled with that med, then update me.  Take care.  Glad to see you.

## 2015-07-21 DIAGNOSIS — R220 Localized swelling, mass and lump, head: Secondary | ICD-10-CM | POA: Insufficient documentation

## 2015-07-21 NOTE — Assessment & Plan Note (Signed)
This was likely from an inert portion of the losartan, not losartan itself but with her sx we still have to stop ARB and not use ACE.  D/w pt.  She agrees.  Stop ARB, can add on CCB is BP is elevated.  BP is fine today.  She'll update me as needed.  Okay for outpatient f/u.  If any other lip swelling, take benadryl.  If not improved or worsening, then to ER.  She agrees.

## 2015-07-27 ENCOUNTER — Other Ambulatory Visit (HOSPITAL_COMMUNITY): Payer: Self-pay | Admitting: Plastic Surgery

## 2015-07-27 DIAGNOSIS — D0512 Intraductal carcinoma in situ of left breast: Secondary | ICD-10-CM | POA: Diagnosis not present

## 2015-07-28 NOTE — Pre-Procedure Instructions (Signed)
Kelly Kennedy  07/28/2015      [id number] [place], [place] - [address] [place] [address] Phone: [phone] Fax: [phone]    Your procedure is scheduled on Thursday, August 06, 2015  Report to [facility] Admitting at 9:30 A.M.  Call this number if you have problems the morning of surgery:  [phone]   Remember:  Do not eat food or drink liquids after midnight Wednesday, August 05, 2015  Take these medicines the morning of surgery with A SIP OF WATER:amLODipine (NORVASC),levothyroxine (SYNTHROID, LEVOTHROID),  omeprazole (PRILOSEC OTC), brimonidine-timolol (COMBIGAN) eye drops, if needed:nitroGLYCERIN (NITROSTAT) for chest pain  Continue to take all your other medicines, including glipizide (Glucotrol) as you normally do until day of surgery and then follow above instructions.  Stop taking Aspirin, vitamins and herbal medications. Do not take any NSAIDs ie: Ibuprofen, Advil, Naproxen or any medication containing Aspirin such as diclofenac sodium (VOLTAREN) and BC headache Powder; stop 1 week prior to procedure ( Thursday, August 06, 2015).  How to Manage Your Diabetes Before Surgery  Why is it important to control my blood sugar before and after surgery?   Improving blood sugar levels before and after surgery helps healing and can limit problems.  A way of improving blood sugar control is eating a healthy diet by:  - Eating less sugar and carbohydrates  - Increasing activity/exercise  - Talk with your doctor about reaching your blood sugar goals  High blood sugars (greater than 180 mg/dL) can raise your risk of infections and slow down your recovery so you will need to focus on controlling your diabetes during the weeks before surgery.  Make sure that the doctor who takes care of your diabetes knows about your planned surgery including the date and location.  How  do I manage my blood sugars before surgery?   Check your blood sugar at least 4 times a day, 2 days before surgery to make sure that they are not too high or low.   Check your blood sugar the morning of your surgery when you wake up and every 2  hours until you get to the Short-Stay unit.  If your blood sugar is less than 70 mg/dL, you will need to treat for low blood sugar by:  Treat a low blood sugar (less than 70 mg/dL) with 1/2 cup of clear juice (cranberry or apple), 4 glucose tablets, OR glucose gel.  Recheck blood sugar in 15 minutes after treatment (to make sure it is greater than 70 mg/dL).  If blood sugar is not greater than 70 mg/dL on re-check, call [phone] for further instructions.   Report your blood sugar to the Short-Stay nurse when you get to Short-Stay.  References:  [facility], 2007 "How to Manage your Diabetes Before and After Surgery".  What do I do about my diabetes medications?   Do not take oral diabetes medicines (pills) the morning of surgery such as glipiZIDE (GLUCOTROL)    Do not wear jewelry, make-up or nail polish.  Do not wear lotions, powders, or perfumes.  You may not wear deodorant.  Do not shave 48 hours prior to surgery.    Do not bring valuables to the hospital.  [facility] is not responsible for any belongings or valuables.  Contacts, dentures or bridgework may not be worn into surgery.  Leave your suitcase in the car.  After surgery  it may be brought to your room.  For patients admitted to the hospital, discharge time will be determined by your treatment team.  Patients discharged the day of surgery will not be allowed to drive home.   Name and phone number of your driver:   Special instructions:  [facility] - Preparing for Surgery  Before surgery, you can play an important role.  Because skin is not sterile, your skin needs to be as free of germs as possible.  You can reduce the number of germs on you  skin by washing with CHG (chlorahexidine gluconate) soap before surgery.  CHG is an antiseptic cleaner which kills germs and bonds with the skin to continue killing germs even after washing.  Please DO NOT use if you have an allergy to CHG or antibacterial soaps.  If your skin becomes reddened/irritated stop using the CHG and inform your nurse when you arrive at Short Stay.  Do not shave (including legs and underarms) for at least 48 hours prior to the first CHG shower.  You may shave your face.  Please follow these instructions carefully:   1.  Shower with CHG Soap the night before surgery and the morning of Surgery.  2.  If you choose to wash your hair, wash your hair first as usual with your normal shampoo.  3.  After you shampoo, rinse your hair and body thoroughly to remove the Shampoo.  4.  Use CHG as you would any other liquid soap.  You can apply chg directly  to the skin and wash gently with scrungie or a clean washcloth.  5.  Apply the CHG Soap to your body ONLY FROM THE NECK DOWN.  Do not use on open wounds or open sores.  Avoid contact with your eyes, ears, mouth and genitals (private parts).  Wash genitals (private parts) with your normal soap.  6.  Wash thoroughly, paying special attention to the area where your surgery will be performed.  7.  Thoroughly rinse your body with warm water from the neck down.  8.  DO NOT shower/wash with your normal soap after using and rinsing off the CHG Soap.  9.  Pat yourself dry with a clean towel.            10.  Wear clean pajamas.            11.  Place clean sheets on your bed the night of your first shower and do not sleep with pets.  Day of Surgery  Do not apply any lotions/deodorants the morning of surgery.  Please wear clean clothes to the hospital/surgery center.  Please read over the following fact sheets that you were given. Pain Booklet, Coughing and Deep Breathing and Surgical Site Infection Prevention

## 2015-07-29 ENCOUNTER — Inpatient Hospital Stay (HOSPITAL_COMMUNITY)
Admission: RE | Admit: 2015-07-29 | Discharge: 2015-07-29 | Disposition: A | Discharge status: Home / Self Care | Payer: Self-pay | Source: Ambulatory Visit

## 2015-07-30 ENCOUNTER — Encounter (HOSPITAL_COMMUNITY)
Admission: RE | Admit: 2015-07-30 | Discharge: 2015-07-30 | Disposition: A | Discharge status: Home / Self Care | Payer: Commercial Managed Care - HMO | Source: Ambulatory Visit | Attending: Surgery | Admitting: Surgery

## 2015-07-30 ENCOUNTER — Encounter (HOSPITAL_COMMUNITY): Payer: Self-pay

## 2015-07-30 DIAGNOSIS — Z01812 Encounter for preprocedural laboratory examination: Secondary | ICD-10-CM | POA: Insufficient documentation

## 2015-07-30 DIAGNOSIS — Z01818 Encounter for other preprocedural examination: Secondary | ICD-10-CM | POA: Diagnosis not present

## 2015-07-30 DIAGNOSIS — Z8673 Personal history of transient ischemic attack (TIA), and cerebral infarction without residual deficits: Secondary | ICD-10-CM | POA: Insufficient documentation

## 2015-07-30 DIAGNOSIS — Z87891 Personal history of nicotine dependence: Secondary | ICD-10-CM | POA: Insufficient documentation

## 2015-07-30 DIAGNOSIS — C50919 Malignant neoplasm of unspecified site of unspecified female breast: Secondary | ICD-10-CM | POA: Diagnosis not present

## 2015-07-30 DIAGNOSIS — Z79899 Other long term (current) drug therapy: Secondary | ICD-10-CM | POA: Diagnosis not present

## 2015-07-30 DIAGNOSIS — E785 Hyperlipidemia, unspecified: Secondary | ICD-10-CM | POA: Diagnosis not present

## 2015-07-30 DIAGNOSIS — I10 Essential (primary) hypertension: Secondary | ICD-10-CM | POA: Diagnosis not present

## 2015-07-30 DIAGNOSIS — E119 Type 2 diabetes mellitus without complications: Secondary | ICD-10-CM | POA: Diagnosis not present

## 2015-07-30 DIAGNOSIS — K219 Gastro-esophageal reflux disease without esophagitis: Secondary | ICD-10-CM | POA: Insufficient documentation

## 2015-07-30 HISTORY — DX: Family history of other specified conditions: Z84.89

## 2015-07-30 LAB — BASIC METABOLIC PANEL
Anion gap: 9 (ref 5–15)
BUN: 10 mg/dL (ref 6–20)
CHLORIDE: 107 mmol/L (ref 101–111)
CO2: 24 mmol/L (ref 22–32)
CREATININE: 0.78 mg/dL (ref 0.44–1.00)
Calcium: 9.6 mg/dL (ref 8.9–10.3)
GFR calc non Af Amer: >60 mL/min (ref >60)
GLUCOSE: 101 mg/dL — AB (ref 65–99)
Potassium: 4.3 mmol/L (ref 3.5–5.1)
Sodium: 140 mmol/L (ref 135–145)

## 2015-07-30 LAB — CBC
HEMATOCRIT: 37.4 % (ref 36.0–46.0)
HEMOGLOBIN: 12.2 g/dL (ref 12.0–15.0)
MCH: 28.3 pg (ref 26.0–34.0)
MCHC: 32.6 g/dL (ref 30.0–36.0)
MCV: 86.8 fL (ref 78.0–100.0)
Platelets: 313 10*3/uL (ref 150–400)
RBC: 4.31 MIL/uL (ref 3.87–5.11)
RDW: 13.8 % (ref 11.5–15.5)
WBC: 5.4 10*3/uL (ref 4.0–10.5)

## 2015-07-30 LAB — GLUCOSE, CAPILLARY: Glucose-Capillary: 112 mg/dL — ABNORMAL HIGH (ref 65–99)

## 2015-07-30 NOTE — Progress Notes (Signed)
   07/30/15 1241  OBSTRUCTIVE SLEEP APNEA  Have you ever been diagnosed with sleep apnea through a sleep study? No  Do you snore loudly (loud enough to be heard through closed doors)?  1  Do you often feel tired, fatigued, or sleepy during the daytime? 1  Has anyone observed you stop breathing during your sleep? 1  Do you have, or are you being treated for high blood pressure? 1  BMI more than 35 kg/m2? 0  Age over 73 years old? 1  Neck circumference greater than 40 cm/16 inches? 0  Gender: 0

## 2015-07-30 NOTE — Progress Notes (Signed)
Pt denies SOB and chest pain but is under the care of Dr. Sallyanne Kuster, cardiology. Pt made aware to stop taking  Aspirin, otc vitamins, NSAID's and herbal medications now. Pt  cardiac cath report was requested from Dr. Sallyanne Kuster in addition to [place] notes. Pt chart forwarded to [place], [place], anesthesia, to review cardiac history.

## 2015-07-31 LAB — HEMOGLOBIN A1C
Hgb A1c MFr Bld: 6.9 % — ABNORMAL HIGH (ref 4.8–5.6)
Mean Plasma Glucose: 151 mg/dL

## 2015-07-31 NOTE — Progress Notes (Signed)
Anesthesia Chart Review:  Pt is 74 year old female scheduled for L nipple sparing mastectomy, breast reconstruction with placement of tissue expander or saline implant and acellular durmal matrix on 08/06/2015 with Dr. Brantley Stage and Dr. Harlow Mares.   Cardiologist is Dr. Sallyanne Kuster, last office visit 11/10/14.  PMH includes: HTN, DM, hyperlipidemia, TIA (09/2014), pericarditis (1980's), chest pain (uses NTG prn), breast cancer, GERD. Former smoker. BMI 33. S/p L breast partial mastectomy 05/06/15. S/p ORIF L ankle fracture 10/23/13.   Medications include: amlodipine, combigan ophthalmic, crestor, lasix, glipizide, levothyroxine, prilosec.   Preoperative labs reviewed.   Chest x-ray 09/28/2014 reviewed. No edema or consolidation.   EKG 09/29/2014: NSR. Non-specific ST-t changes. Prolonged QT  Echo 12/02/2014:  - Left ventricle: The cavity size was normal. Wall thickness was normal. Systolic function was normal. The estimated ejection fraction was in the range of 50% to 55%. Wall motion was normal; there were no regional wall motion abnormalities. Doppler parameters are consistent with abnormal left ventricular relaxation (grade 1 diastolic dysfunction).  Cardiac event monitor 11/6-12/03/2014: -no evidence of atrial fibrillation or any other meaningful arrhythmia per Dr. Victorino December notes  Nuclear stress test 09/29/2014: 1. No reversible ischemia or infarction. 2. Normal left ventricular wall motion. 3. Left ventricular ejection fraction 62% 4. Low-risk stress test findings  Carotid duplex US 09/29/2014: 1-39% right ICA and 40-59% left ICA stenosis. Moderate right ECA stenosis. Both vertebral artery flow is antegrade.  Pt wasn't sure if she had had a cardiac cath in the past but I can find no evidence of CAD or a cath in pt's records.   If no changes, I anticipate pt can proceed with surgery as scheduled.   Willeen Cass, FNP-BC [facility]/Anesthesiology Phone:  [phone] 07/31/2015 1:33 PM

## 2015-08-06 ENCOUNTER — Ambulatory Visit (HOSPITAL_COMMUNITY)
Admission: RE | Admit: 2015-08-06 | Discharge: 2015-08-08 | Disposition: A | Discharge status: Home / Self Care | Payer: Commercial Managed Care - HMO | Source: Ambulatory Visit | Attending: Surgery | Admitting: Surgery

## 2015-08-06 ENCOUNTER — Encounter (HOSPITAL_COMMUNITY): Payer: Self-pay | Admitting: Anesthesiology

## 2015-08-06 ENCOUNTER — Ambulatory Visit (HOSPITAL_COMMUNITY): Payer: Commercial Managed Care - HMO

## 2015-08-06 ENCOUNTER — Encounter (HOSPITAL_COMMUNITY)
Admission: RE | Disposition: A | Discharge status: Home / Self Care | Payer: Self-pay | Source: Ambulatory Visit | Attending: Surgery

## 2015-08-06 DIAGNOSIS — Z87891 Personal history of nicotine dependence: Secondary | ICD-10-CM | POA: Diagnosis not present

## 2015-08-06 DIAGNOSIS — R509 Fever, unspecified: Secondary | ICD-10-CM | POA: Diagnosis not present

## 2015-08-06 DIAGNOSIS — F329 Major depressive disorder, single episode, unspecified: Secondary | ICD-10-CM | POA: Diagnosis not present

## 2015-08-06 DIAGNOSIS — Z86010 Personal history of colonic polyps: Secondary | ICD-10-CM | POA: Diagnosis not present

## 2015-08-06 DIAGNOSIS — G4700 Insomnia, unspecified: Secondary | ICD-10-CM | POA: Diagnosis not present

## 2015-08-06 DIAGNOSIS — M8580 Other specified disorders of bone density and structure, unspecified site: Secondary | ICD-10-CM | POA: Diagnosis not present

## 2015-08-06 DIAGNOSIS — Z8719 Personal history of other diseases of the digestive system: Secondary | ICD-10-CM | POA: Diagnosis not present

## 2015-08-06 DIAGNOSIS — N6012 Diffuse cystic mastopathy of left breast: Secondary | ICD-10-CM | POA: Insufficient documentation

## 2015-08-06 DIAGNOSIS — M1990 Unspecified osteoarthritis, unspecified site: Secondary | ICD-10-CM | POA: Diagnosis not present

## 2015-08-06 DIAGNOSIS — Z888 Allergy status to other drugs, medicaments and biological substances status: Secondary | ICD-10-CM | POA: Insufficient documentation

## 2015-08-06 DIAGNOSIS — E119 Type 2 diabetes mellitus without complications: Secondary | ICD-10-CM | POA: Insufficient documentation

## 2015-08-06 DIAGNOSIS — E782 Mixed hyperlipidemia: Secondary | ICD-10-CM | POA: Diagnosis not present

## 2015-08-06 DIAGNOSIS — I10 Essential (primary) hypertension: Secondary | ICD-10-CM | POA: Diagnosis not present

## 2015-08-06 DIAGNOSIS — D0512 Intraductal carcinoma in situ of left breast: Principal | ICD-10-CM | POA: Diagnosis present

## 2015-08-06 DIAGNOSIS — F419 Anxiety disorder, unspecified: Secondary | ICD-10-CM | POA: Diagnosis not present

## 2015-08-06 DIAGNOSIS — D0592 Unspecified type of carcinoma in situ of left breast: Secondary | ICD-10-CM | POA: Diagnosis not present

## 2015-08-06 DIAGNOSIS — Z8673 Personal history of transient ischemic attack (TIA), and cerebral infarction without residual deficits: Secondary | ICD-10-CM | POA: Insufficient documentation

## 2015-08-06 DIAGNOSIS — G8918 Other acute postprocedural pain: Secondary | ICD-10-CM | POA: Diagnosis not present

## 2015-08-06 DIAGNOSIS — D242 Benign neoplasm of left breast: Secondary | ICD-10-CM | POA: Diagnosis not present

## 2015-08-06 DIAGNOSIS — K219 Gastro-esophageal reflux disease without esophagitis: Secondary | ICD-10-CM | POA: Diagnosis not present

## 2015-08-06 DIAGNOSIS — E039 Hypothyroidism, unspecified: Secondary | ICD-10-CM | POA: Diagnosis not present

## 2015-08-06 DIAGNOSIS — N641 Fat necrosis of breast: Secondary | ICD-10-CM | POA: Insufficient documentation

## 2015-08-06 DIAGNOSIS — C50919 Malignant neoplasm of unspecified site of unspecified female breast: Secondary | ICD-10-CM | POA: Diagnosis present

## 2015-08-06 HISTORY — PX: BREAST RECONSTRUCTION WITH PLACEMENT OF TISSUE EXPANDER AND FLEX HD (ACELLULAR HYDRATED DERMIS): SHX6295

## 2015-08-06 HISTORY — PX: NIPPLE SPARING MASTECTOMY: SHX6537

## 2015-08-06 LAB — CREATININE, SERUM
CREATININE: 0.75 mg/dL (ref 0.44–1.00)
GFR calc Af Amer: >60 mL/min (ref >60)
GFR calc non Af Amer: >60 mL/min (ref >60)

## 2015-08-06 LAB — CBC
HCT: 34.6 % — ABNORMAL LOW (ref 36.0–46.0)
Hemoglobin: 11.5 g/dL — ABNORMAL LOW (ref 12.0–15.0)
MCH: 29.5 pg (ref 26.0–34.0)
MCHC: 33.2 g/dL (ref 30.0–36.0)
MCV: 88.7 fL (ref 78.0–100.0)
PLATELETS: 296 10*3/uL (ref 150–400)
RBC: 3.9 MIL/uL (ref 3.87–5.11)
RDW: 14.3 % (ref 11.5–15.5)
WBC: 12.7 10*3/uL — AB (ref 4.0–10.5)

## 2015-08-06 LAB — GLUCOSE, CAPILLARY
GLUCOSE-CAPILLARY: 129 mg/dL — AB (ref 65–99)
Glucose-Capillary: 117 mg/dL — ABNORMAL HIGH (ref 65–99)
Glucose-Capillary: 141 mg/dL — ABNORMAL HIGH (ref 65–99)

## 2015-08-06 SURGERY — MASTECTOMY, NIPPLE SPARING
Anesthesia: Regional | Site: Breast | Laterality: Left

## 2015-08-06 MED ORDER — DEXTROSE-NACL 5-0.9 % IV SOLN
INTRAVENOUS | Status: DC
  Administered 2015-08-06: 23:00:00 via INTRAVENOUS

## 2015-08-06 MED ORDER — ONDANSETRON HCL 4 MG/2ML IJ SOLN
INTRAMUSCULAR | Status: AC
  Filled 2015-08-06: qty 2

## 2015-08-06 MED ORDER — LIDOCAINE HCL (CARDIAC) 20 MG/ML IV SOLN
INTRAVENOUS | Status: AC
  Filled 2015-08-06: qty 5

## 2015-08-06 MED ORDER — HYDROMORPHONE HCL 1 MG/ML IJ SOLN
1.0000 mg | INTRAMUSCULAR | Status: DC | PRN
  Administered 2015-08-06 – 2015-08-07 (×2): 1 mg via INTRAVENOUS
  Filled 2015-08-06 (×2): qty 1

## 2015-08-06 MED ORDER — LACTATED RINGERS IV SOLN
INTRAVENOUS | Status: DC
  Administered 2015-08-06 (×2): via INTRAVENOUS

## 2015-08-06 MED ORDER — HYDROMORPHONE HCL 1 MG/ML IJ SOLN
0.2500 mg | INTRAMUSCULAR | Status: DC | PRN
  Administered 2015-08-06: 0.5 mg via INTRAVENOUS

## 2015-08-06 MED ORDER — ZOLPIDEM TARTRATE 5 MG PO TABS: 5.0000 mg | ORAL_TABLET | Freq: Every evening | ORAL | Status: DC | PRN

## 2015-08-06 MED ORDER — AMITRIPTYLINE HCL 50 MG PO TABS: 50.0000 mg | ORAL_TABLET | Freq: Every evening | ORAL | Status: DC | PRN

## 2015-08-06 MED ORDER — INSULIN ASPART 100 UNIT/ML ~~LOC~~ SOLN: 0.0000 [IU] | Freq: Three times a day (TID) | SUBCUTANEOUS | Status: DC

## 2015-08-06 MED ORDER — NEOSTIGMINE METHYLSULFATE 10 MG/10ML IV SOLN
INTRAVENOUS | Status: AC
  Filled 2015-08-06: qty 1

## 2015-08-06 MED ORDER — INSULIN ASPART 100 UNIT/ML ~~LOC~~ SOLN
0.0000 [IU] | Freq: Every day | SUBCUTANEOUS | Status: DC
Start: 2015-08-06 — End: 2015-08-08

## 2015-08-06 MED ORDER — HYDROMORPHONE HCL 1 MG/ML IJ SOLN
INTRAMUSCULAR | Status: AC
  Filled 2015-08-06: qty 1

## 2015-08-06 MED ORDER — OXYCODONE HCL 5 MG PO TABS: 5.0000 mg | ORAL_TABLET | Freq: Once | ORAL | Status: DC | PRN

## 2015-08-06 MED ORDER — SUCCINYLCHOLINE CHLORIDE 20 MG/ML IJ SOLN
INTRAMUSCULAR | Status: DC | PRN
  Administered 2015-08-06: 100 mg via INTRAVENOUS

## 2015-08-06 MED ORDER — NITROGLYCERIN 0.4 MG SL SUBL: 0.4000 mg | SUBLINGUAL_TABLET | SUBLINGUAL | Status: DC | PRN

## 2015-08-06 MED ORDER — ENOXAPARIN SODIUM 40 MG/0.4ML ~~LOC~~ SOLN
40.0000 mg | SUBCUTANEOUS | Status: DC
  Administered 2015-08-07: 40 mg via SUBCUTANEOUS
  Filled 2015-08-06: qty 0.4

## 2015-08-06 MED ORDER — LIDOCAINE HCL (CARDIAC) 20 MG/ML IV SOLN
INTRAVENOUS | Status: DC | PRN
  Administered 2015-08-06: 60 mg via INTRAVENOUS

## 2015-08-06 MED ORDER — EPHEDRINE SULFATE 50 MG/ML IJ SOLN
INTRAMUSCULAR | Status: AC
  Filled 2015-08-06: qty 1

## 2015-08-06 MED ORDER — EPHEDRINE SULFATE 50 MG/ML IJ SOLN
INTRAMUSCULAR | Status: DC | PRN
  Administered 2015-08-06: 5 mg via INTRAVENOUS
  Administered 2015-08-06 (×2): 10 mg via INTRAVENOUS
  Administered 2015-08-06: 5 mg via INTRAVENOUS
  Administered 2015-08-06 (×2): 10 mg via INTRAVENOUS

## 2015-08-06 MED ORDER — SUCCINYLCHOLINE CHLORIDE 20 MG/ML IJ SOLN
INTRAMUSCULAR | Status: AC
  Filled 2015-08-06: qty 1

## 2015-08-06 MED ORDER — TIMOLOL MALEATE 0.5 % OP SOLN
1.0000 [drp] | Freq: Two times a day (BID) | OPHTHALMIC | Status: DC
  Administered 2015-08-06 – 2015-08-07 (×3): 1 [drp] via OPHTHALMIC
  Filled 2015-08-06 (×2): qty 5

## 2015-08-06 MED ORDER — PROPOFOL 10 MG/ML IV BOLUS
INTRAVENOUS | Status: AC
  Filled 2015-08-06: qty 20

## 2015-08-06 MED ORDER — OMEPRAZOLE 20 MG PO CPDR
20.0000 mg | DELAYED_RELEASE_CAPSULE | Freq: Every day | ORAL | Status: DC
  Administered 2015-08-07: 20 mg via ORAL
  Filled 2015-08-06 (×2): qty 1

## 2015-08-06 MED ORDER — SODIUM CHLORIDE 0.9 % IV SOLN
Freq: Once | INTRAVENOUS | Status: AC
  Administered 2015-08-06: 14:00:00
  Filled 2015-08-06: qty 1

## 2015-08-06 MED ORDER — LEVOTHYROXINE SODIUM 50 MCG PO TABS: 50.0000 ug | ORAL_TABLET | ORAL | Status: DC

## 2015-08-06 MED ORDER — PROMETHAZINE HCL 25 MG/ML IJ SOLN: 6.2500 mg | INTRAMUSCULAR | Status: DC | PRN

## 2015-08-06 MED ORDER — BRIMONIDINE TARTRATE-TIMOLOL 0.2-0.5 % OP SOLN: 1.0000 [drp] | Freq: Two times a day (BID) | OPHTHALMIC | Status: DC

## 2015-08-06 MED ORDER — ARTIFICIAL TEARS OP OINT
TOPICAL_OINTMENT | OPHTHALMIC | Status: DC | PRN
  Administered 2015-08-06: 1 via OPHTHALMIC

## 2015-08-06 MED ORDER — MIDAZOLAM HCL 2 MG/2ML IJ SOLN
INTRAMUSCULAR | Status: AC
  Administered 2015-08-06: 1 mg via INTRAVENOUS
  Filled 2015-08-06: qty 2

## 2015-08-06 MED ORDER — OXYCODONE HCL 5 MG/5ML PO SOLN: 5.0000 mg | Freq: Once | ORAL | Status: DC | PRN

## 2015-08-06 MED ORDER — FENTANYL CITRATE (PF) 100 MCG/2ML IJ SOLN
100.0000 ug | Freq: Once | INTRAMUSCULAR | Status: AC
  Administered 2015-08-06: 50 ug via INTRAVENOUS
  Filled 2015-08-06: qty 2

## 2015-08-06 MED ORDER — ROCURONIUM BROMIDE 50 MG/5ML IV SOLN
INTRAVENOUS | Status: AC
  Filled 2015-08-06: qty 1

## 2015-08-06 MED ORDER — ONDANSETRON HCL 4 MG/2ML IJ SOLN
INTRAMUSCULAR | Status: DC | PRN
  Administered 2015-08-06: 4 mg via INTRAVENOUS

## 2015-08-06 MED ORDER — GLYCOPYRROLATE 0.2 MG/ML IJ SOLN
INTRAMUSCULAR | Status: AC
  Filled 2015-08-06: qty 3

## 2015-08-06 MED ORDER — VANCOMYCIN HCL IN DEXTROSE 1-5 GM/200ML-% IV SOLN
1000.0000 mg | INTRAVENOUS | Status: AC
  Administered 2015-08-06: 1000 mg via INTRAVENOUS
  Filled 2015-08-06: qty 200

## 2015-08-06 MED ORDER — HYDROMORPHONE HCL 2 MG PO TABS: 2.0000 mg | ORAL_TABLET | ORAL | Status: DC | PRN

## 2015-08-06 MED ORDER — ROCURONIUM BROMIDE 100 MG/10ML IV SOLN
INTRAVENOUS | Status: DC | PRN
  Administered 2015-08-06: 5 mg via INTRAVENOUS
  Administered 2015-08-06: 10 mg via INTRAVENOUS
  Administered 2015-08-06: 30 mg via INTRAVENOUS

## 2015-08-06 MED ORDER — SODIUM CHLORIDE 0.9 % IJ SOLN
INTRAMUSCULAR | Status: AC
  Filled 2015-08-06: qty 10

## 2015-08-06 MED ORDER — MIDAZOLAM HCL 2 MG/2ML IJ SOLN
2.0000 mg | Freq: Once | INTRAMUSCULAR | Status: AC
  Administered 2015-08-06: 1 mg via INTRAVENOUS
  Filled 2015-08-06: qty 2

## 2015-08-06 MED ORDER — POLYVINYL ALCOHOL 1.4 % OP SOLN
1.0000 [drp] | Freq: Every day | OPHTHALMIC | Status: DC
  Administered 2015-08-06 – 2015-08-07 (×2): 1 [drp] via OPHTHALMIC
  Filled 2015-08-06: qty 15

## 2015-08-06 MED ORDER — 0.9 % SODIUM CHLORIDE (POUR BTL) OPTIME
TOPICAL | Status: DC | PRN
  Administered 2015-08-06: 2000 mL

## 2015-08-06 MED ORDER — CARBOXYMETHYLCELL-HYPROMELLOSE 0.25-0.3 % OP GEL: Freq: Every day | OPHTHALMIC | Status: DC

## 2015-08-06 MED ORDER — FENTANYL CITRATE (PF) 250 MCG/5ML IJ SOLN
INTRAMUSCULAR | Status: AC
  Filled 2015-08-06: qty 5

## 2015-08-06 MED ORDER — HEPARIN SODIUM (PORCINE) 5000 UNIT/ML IJ SOLN
5000.0000 [IU] | Freq: Once | INTRAMUSCULAR | Status: AC
  Administered 2015-08-06: 5000 [IU] via SUBCUTANEOUS
  Filled 2015-08-06: qty 1

## 2015-08-06 MED ORDER — ONDANSETRON HCL 4 MG/2ML IJ SOLN: 4.0000 mg | Freq: Four times a day (QID) | INTRAMUSCULAR | Status: DC | PRN

## 2015-08-06 MED ORDER — CHLORHEXIDINE GLUCONATE 4 % EX LIQD: 1.0000 "application " | Freq: Once | CUTANEOUS | Status: DC

## 2015-08-06 MED ORDER — DEXTROSE 5 % IV SOLN
3.0000 g | INTRAVENOUS | Status: AC
  Administered 2015-08-06: 3 g via INTRAVENOUS
  Filled 2015-08-06: qty 3000

## 2015-08-06 MED ORDER — AMLODIPINE BESYLATE 5 MG PO TABS
5.0000 mg | ORAL_TABLET | Freq: Every day | ORAL | Status: DC
  Administered 2015-08-07: 5 mg via ORAL
  Filled 2015-08-06: qty 1

## 2015-08-06 MED ORDER — BUPIVACAINE-EPINEPHRINE (PF) 0.5% -1:200000 IJ SOLN
INTRAMUSCULAR | Status: DC | PRN
  Administered 2015-08-06: 30 mL via PERINEURAL

## 2015-08-06 MED ORDER — PROPOFOL 10 MG/ML IV BOLUS
INTRAVENOUS | Status: DC | PRN
  Administered 2015-08-06: 120 mg via INTRAVENOUS

## 2015-08-06 MED ORDER — METHOCARBAMOL 500 MG PO TABS
500.0000 mg | ORAL_TABLET | Freq: Four times a day (QID) | ORAL | Status: DC | PRN
  Administered 2015-08-08: 500 mg via ORAL
  Filled 2015-08-06: qty 1

## 2015-08-06 MED ORDER — NEOSTIGMINE METHYLSULFATE 10 MG/10ML IV SOLN
INTRAVENOUS | Status: DC | PRN
  Administered 2015-08-06: 5 mg via INTRAVENOUS

## 2015-08-06 MED ORDER — FUROSEMIDE 20 MG PO TABS
20.0000 mg | ORAL_TABLET | Freq: Every day | ORAL | Status: DC
  Administered 2015-08-07: 20 mg via ORAL
  Filled 2015-08-06: qty 1

## 2015-08-06 MED ORDER — OXYCODONE HCL 5 MG PO TABS
5.0000 mg | ORAL_TABLET | ORAL | Status: DC | PRN
  Administered 2015-08-07 – 2015-08-08 (×4): 10 mg via ORAL
  Filled 2015-08-06 (×4): qty 2

## 2015-08-06 MED ORDER — LEVOTHYROXINE SODIUM 50 MCG PO TABS
100.0000 ug | ORAL_TABLET | ORAL | Status: DC
  Administered 2015-08-07: 100 ug via ORAL
  Filled 2015-08-06: qty 2

## 2015-08-06 MED ORDER — PHENYLEPHRINE HCL 10 MG/ML IJ SOLN
10.0000 mg | INTRAVENOUS | Status: DC | PRN
  Administered 2015-08-06: 40 ug/min via INTRAVENOUS

## 2015-08-06 MED ORDER — ACETAMINOPHEN 325 MG PO TABS
650.0000 mg | ORAL_TABLET | Freq: Four times a day (QID) | ORAL | Status: DC | PRN
  Administered 2015-08-07: 650 mg via ORAL
  Filled 2015-08-06: qty 2

## 2015-08-06 MED ORDER — ROSUVASTATIN CALCIUM 10 MG PO TABS
10.0000 mg | ORAL_TABLET | Freq: Every day | ORAL | Status: DC
  Administered 2015-08-07: 10 mg via ORAL
  Filled 2015-08-06 (×3): qty 1

## 2015-08-06 MED ORDER — GLYCOPYRROLATE 0.2 MG/ML IJ SOLN
INTRAMUSCULAR | Status: DC | PRN
  Administered 2015-08-06: .8 mg via INTRAVENOUS

## 2015-08-06 MED ORDER — SODIUM CHLORIDE 0.9 % IV SOLN
INTRAVENOUS | Status: DC | PRN
  Administered 2015-08-06: 1000 mL

## 2015-08-06 MED ORDER — ONDANSETRON 4 MG PO TBDP: 4.0000 mg | ORAL_TABLET | Freq: Four times a day (QID) | ORAL | Status: DC | PRN

## 2015-08-06 MED ORDER — FENTANYL CITRATE (PF) 100 MCG/2ML IJ SOLN
INTRAMUSCULAR | Status: AC
  Administered 2015-08-06: 50 ug via INTRAVENOUS
  Administered 2015-08-06: 150 ug via INTRAVENOUS
  Administered 2015-08-06: 50 ug via INTRAVENOUS
  Filled 2015-08-06: qty 2

## 2015-08-06 MED ORDER — VANCOMYCIN HCL IN DEXTROSE 1-5 GM/200ML-% IV SOLN
1000.0000 mg | Freq: Two times a day (BID) | INTRAVENOUS | Status: DC
  Administered 2015-08-06 – 2015-08-07 (×3): 1000 mg via INTRAVENOUS
  Filled 2015-08-06 (×5): qty 200

## 2015-08-06 MED ORDER — INSULIN ASPART 100 UNIT/ML ~~LOC~~ SOLN
0.0000 [IU] | Freq: Three times a day (TID) | SUBCUTANEOUS | Status: DC
Start: 2015-08-07 — End: 2015-08-08
  Administered 2015-08-07: 2 [IU] via SUBCUTANEOUS
  Administered 2015-08-07: 3 [IU] via SUBCUTANEOUS

## 2015-08-06 MED ORDER — PHENYLEPHRINE HCL 10 MG/ML IJ SOLN
INTRAMUSCULAR | Status: DC | PRN
  Administered 2015-08-06 (×2): 40 ug via INTRAVENOUS

## 2015-08-06 MED ORDER — ARTIFICIAL TEARS OP OINT
TOPICAL_OINTMENT | OPHTHALMIC | Status: AC
  Filled 2015-08-06: qty 3.5

## 2015-08-06 MED ORDER — PROSIGHT PO TABS
1.0000 | ORAL_TABLET | Freq: Every day | ORAL | Status: DC
  Administered 2015-08-07: 1 via ORAL
  Filled 2015-08-06 (×3): qty 1

## 2015-08-06 MED ORDER — BRIMONIDINE TARTRATE 0.2 % OP SOLN
1.0000 [drp] | Freq: Two times a day (BID) | OPHTHALMIC | Status: DC
  Administered 2015-08-06 – 2015-08-07 (×3): 1 [drp] via OPHTHALMIC
  Filled 2015-08-06 (×2): qty 5

## 2015-08-06 SURGICAL SUPPLY — 75 items
ADH SKN CLS APL DERMABOND .7 (GAUZE/BANDAGES/DRESSINGS) ×1
APPLIER CLIP 9.375 MED OPEN (MISCELLANEOUS)
APR CLP MED 9.3 20 MLT OPN (MISCELLANEOUS)
ATCH SMKEVC FLXB CAUT HNDSWH (FILTER) ×1 IMPLANT
BAG DECANTER FOR FLEXI CONT (MISCELLANEOUS) ×5 IMPLANT
BINDER BREAST LRG (GAUZE/BANDAGES/DRESSINGS) ×2 IMPLANT
BIOPATCH RED 1 DISK 7.0 (GAUZE/BANDAGES/DRESSINGS) ×4 IMPLANT
BIOPATCH RED 1IN DISK 7.0MM (GAUZE/BANDAGES/DRESSINGS) ×2
BLADE 10 SAFETY STRL DISP (BLADE) ×3 IMPLANT
BNDG CMPR MED 10X6 ELC LF (GAUZE/BANDAGES/DRESSINGS) ×1
BNDG ELASTIC 6X10 VLCR STRL LF (GAUZE/BANDAGES/DRESSINGS) ×2 IMPLANT
CANISTER SUCTION 2500CC (MISCELLANEOUS) ×3 IMPLANT
CHLORAPREP W/TINT 26ML (MISCELLANEOUS) ×3 IMPLANT
CLIP APPLIE 9.375 MED OPEN (MISCELLANEOUS) IMPLANT
COVER SURGICAL LIGHT HANDLE (MISCELLANEOUS) ×3 IMPLANT
DERMABOND ADVANCED (GAUZE/BANDAGES/DRESSINGS) ×2
DERMABOND ADVANCED .7 DNX12 (GAUZE/BANDAGES/DRESSINGS) ×1 IMPLANT
DRAIN CHANNEL 19F RND (DRAIN) ×6 IMPLANT
DRAPE ORTHO SPLIT 77X108 STRL (DRAPES) ×6
DRAPE PROXIMA HALF (DRAPES) ×9 IMPLANT
DRAPE SURG 17X23 STRL (DRAPES) ×6 IMPLANT
DRAPE SURG ORHT 6 SPLT 77X108 (DRAPES) ×2 IMPLANT
DRAPE WARM FLUID 44X44 (DRAPE) ×3 IMPLANT
DRSG PAD ABDOMINAL 8X10 ST (GAUZE/BANDAGES/DRESSINGS) ×6 IMPLANT
DRSG SORBAVIEW 3.5X5-5/16 MED (GAUZE/BANDAGES/DRESSINGS) ×6 IMPLANT
ELECT BLADE 6.5 EXT (BLADE) IMPLANT
ELECT CAUTERY BLADE 6.4 (BLADE) ×3 IMPLANT
ELECT REM PT RETURN 9FT ADLT (ELECTROSURGICAL) ×3
ELECTRODE REM PT RTRN 9FT ADLT (ELECTROSURGICAL) ×1 IMPLANT
EVACUATOR SILICONE 100CC (DRAIN) ×6 IMPLANT
EVACUATOR SMOKE ACCUVAC VALLEY (FILTER) ×2
GLOVE BIO SURGEON STRL SZ7.5 (GLOVE) ×7 IMPLANT
GLOVE BIOGEL PI IND STRL 6.5 (GLOVE) IMPLANT
GLOVE BIOGEL PI IND STRL 7.0 (GLOVE) IMPLANT
GLOVE BIOGEL PI IND STRL 7.5 (GLOVE) IMPLANT
GLOVE BIOGEL PI IND STRL 8 (GLOVE) ×1 IMPLANT
GLOVE BIOGEL PI IND STRL 8.5 (GLOVE) IMPLANT
GLOVE BIOGEL PI INDICATOR 6.5 (GLOVE) ×2
GLOVE BIOGEL PI INDICATOR 7.0 (GLOVE) ×4
GLOVE BIOGEL PI INDICATOR 7.5 (GLOVE) ×2
GLOVE BIOGEL PI INDICATOR 8 (GLOVE) ×2
GLOVE BIOGEL PI INDICATOR 8.5 (GLOVE) ×2
GLOVE ECLIPSE 7.5 STRL STRAW (GLOVE) ×4 IMPLANT
GLOVE SURG SS PI 6.5 STRL IVOR (GLOVE) ×4 IMPLANT
GOWN STRL REUS W/ TWL LRG LVL3 (GOWN DISPOSABLE) ×1 IMPLANT
GOWN STRL REUS W/ TWL XL LVL3 (GOWN DISPOSABLE) ×1 IMPLANT
GOWN STRL REUS W/TWL LRG LVL3 (GOWN DISPOSABLE) ×6
GOWN STRL REUS W/TWL XL LVL3 (GOWN DISPOSABLE) ×9
GRAFT FLEX HD 8X16 THICK (Tissue Mesh) ×2 IMPLANT
IMPL BREAST HI PRO 420CC (Breast) IMPLANT
IMPLANT BREAST HI PRO 420CC (Breast) ×3 IMPLANT
KIT BASIN OR (CUSTOM PROCEDURE TRAY) ×3 IMPLANT
KIT ROOM TURNOVER OR (KITS) ×3 IMPLANT
LIGHT WAVEGUIDE WIDE FLAT (MISCELLANEOUS) ×2 IMPLANT
MARKER SKIN DUAL TIP RULER LAB (MISCELLANEOUS) ×3 IMPLANT
MENTOR ROUND HIGH PROFILE SINGLE USE SALINE BREAST IMPLANT SIZER ×2 IMPLANT
NS IRRIG 1000ML POUR BTL (IV SOLUTION) ×6 IMPLANT
PACK GENERAL/GYN (CUSTOM PROCEDURE TRAY) ×3 IMPLANT
PAD ABD 8X10 STRL (GAUZE/BANDAGES/DRESSINGS) ×2 IMPLANT
PAD ARMBOARD 7.5X6 YLW CONV (MISCELLANEOUS) ×3 IMPLANT
PREFILTER EVAC NS 1 1/3-3/8IN (MISCELLANEOUS) ×3 IMPLANT
SET ASEPTIC TRANSFER (MISCELLANEOUS) ×2 IMPLANT
SIZER BREAST SGL 420CC (SIZER) IMPLANT
SIZER BREAST SGL USE 420CC (SIZER) ×3
SPONGE LAP 18X18 X RAY DECT (DISPOSABLE) ×2 IMPLANT
SUT MNCRL AB 3-0 PS2 18 (SUTURE) ×12 IMPLANT
SUT PDS AB 3-0 SH 27 (SUTURE) ×2 IMPLANT
SUT PROLENE 3 0 PS 2 (SUTURE) ×10 IMPLANT
SUT VIC AB 3-0 SH 18 (SUTURE) ×3 IMPLANT
SYR BULB IRRIGATION 50ML (SYRINGE) ×3 IMPLANT
TOWEL OR 17X24 6PK STRL BLUE (TOWEL DISPOSABLE) ×3 IMPLANT
TOWEL OR 17X26 10 PK STRL BLUE (TOWEL DISPOSABLE) ×5 IMPLANT
TRAY FOLEY CATH 16FR SILVER (SET/KITS/TRAYS/PACK) IMPLANT
TUBE CONNECTING 12'X1/4 (SUCTIONS) ×1
TUBE CONNECTING 12X1/4 (SUCTIONS) ×2 IMPLANT

## 2015-08-06 NOTE — Progress Notes (Signed)
Per Theadora Rama, RN at OR desk who checked with Dr. Brantley Stage and Dr. Harlow Mares at bedside at present want both IV Vancomycin 1 gram and Ancef 3 grams, left at bedside to be sent to OR.

## 2015-08-06 NOTE — Progress Notes (Signed)
CRNA at bedside, preparing to take pt to OR.

## 2015-08-06 NOTE — Interval H&P Note (Signed)
History and Physical Interval Note:  08/06/2015 9:53 AM  Kelly Kennedy  has presented today for surgery, with the diagnosis of Left Breast DCIS  The various methods of treatment have been discussed with the patient and family. After consideration of risks, benefits and other options for treatment, the patient has consented to  Procedure(s): LEFT NIPPLE SPARING MASTECTOMY (Left) LEFT BREAST RECONSTRUCTION WITH PLACEMENT OF TISSUE EXPANDER OR SALINE IMPLANT AND ACELLULAR [place] (Left) as a surgical intervention .  The patient's history has been reviewed, patient examined, no change in status, stable for surgery.  I have reviewed the patient's chart and labs.  Questions were answered to the patient's satisfaction.     Kurstyn Larios A.

## 2015-08-06 NOTE — Op Note (Signed)
Preoperative diagnosis: Left breast DCIS  Postoperative diagnosis: Same  Procedure: Nipple sparing left simple mastectomy  Surgeon: [name] M.D.  Anesthesia: Gen. endotracheal anesthesia with pectoral block placed by anesthesia  Specimen: Left breast mastectomy and biopsy of nipple to pathology  EBL: Minimal  Indications for procedure: The patient is a 74 year old female who is under: Left breast lumpectomy for DCIS. The area was extensive and she had close margins. We discussed reexcision versus radiation therapy versus observation. She desired mastectomy at this point wishes to preserve her nipple. She received junction plastic surgery and nipple preserving the left simple mastectomy was recommended with reconstruction.The surgical and non surgical options have been discussed with the patient.  Risks of surgery include bleeding, nipple loss   Infection,  Flap necrosis,  Tissue loss,  Chronic pain, death, Numbness,  And the need for additional procedures.  Reconstruction options also have been discussed with the patient as well.  The patient agrees to proceed.  Description of procedure: Patient was met in the holding area and questions are answered. Left breast was marked as correct side with the assistance of the patient. Patient underwent left pectoral block per anesthesia. She's taken back to the operating room and placed supine on the OR table. After induction of general endotracheal anesthesia, upper chest region and arms were prepped and draped in a sterile fashion bilaterally. She was in prepped and draped. Timeout was done to verify proper procedure, patient and side. Incision was made in the left inferior mammary crease. Dissection was carried down to the fascia of the pectoralis major muscle was encountered. The breast tissue is mobilized off the pectoralis muscle to include the fascia toward the sternum and up toward the clavicle. Once the breast was separated posteriorly, skin flaps  were raised anterior anteriorly. Care was taken not to injure the skin. Sharp dissection was used and the nipple dissect away from the breast tissue. Sharp dissection was also used where she had her previous lumpectomy in the upper outer quadrant. Once of breast was separated from the skin up to the clavicle and over to the sternum was able to excise the specimen. This breast was removed and oriented with ink. A biopsy of the undersurface the nipple was done and sent to pathology as well. Hemostasis was achieved. All counts are correct this portion the case. At this point Dr. Harlow Mares scrubbed and he will dictate his portion separately. The patient was overall stable at this point.

## 2015-08-06 NOTE — Brief Op Note (Signed)
08/06/2015  3:11 PM  PATIENT:  Kelly Kennedy  74 y.o. female  PRE-OPERATIVE DIAGNOSIS:  Left Breast DCIS  POST-OPERATIVE DIAGNOSIS:  Left Breast DCIS  PROCEDURE:  Procedure(s): LEFT NIPPLE SPARING MASTECTOMY (Left) LEFT BREAST RECONSTRUCTION WITH PLACEMENT OF SALINE IMPLANT AND ACELLULAR DURMAL MATRIX (Left)  SURGEON:  Surgeon(s) and Role: Panel 1:    * [name], MD - Primary  Panel 2:    * Crissie Reese, MD - Primary  PHYSICIAN ASSISTANT:   ASSISTANTS: Judyann Munson, RNFA   ANESTHESIA:   general  EBL:  Total I/O In: 1050 [I.V.:1000; IV Piggyback:50] Out: 400 [Urine:200; Blood:200]  BLOOD ADMINISTERED:none  DRAINS: (2) Jackson-Pratt drain(s) with closed bulb suction in the left mastectomy space   LOCAL MEDICATIONS USED:  NONE  SPECIMEN:  No Specimen  DISPOSITION OF SPECIMEN:  [name]  COUNTS:  YES  TOURNIQUET:  * No tourniquets in log *  DICTATION: .Other Dictation: Dictation Number [id number]  PLAN OF CARE: Admit to inpatient   PATIENT DISPOSITION:  PACU - hemodynamically stable.   Delay start of Pharmacological VTE agent (>24hrs) due to surgical blood loss or risk of bleeding: no

## 2015-08-06 NOTE — Anesthesia Procedure Notes (Addendum)
Anesthesia Regional Block:  Pectoralis block  Pre-Anesthetic Checklist: ,, timeout performed, Correct Patient, Correct Site, Correct Laterality, Correct Procedure, Correct Position, site marked, Risks and benefits discussed,  Surgical consent,  Pre-op evaluation,  At surgeon's request and post-op pain management  Laterality: Left  Prep: chloraprep       Needles:  Injection technique: Single-shot  Needle Type: Echogenic Needle     Needle Length: 9cm 9 cm Needle Gauge: 22 and 22 G    Additional Needles:  Procedures: ultrasound guided (picture in chart) Pectoralis block Narrative:  Start time: 08/06/2015 11:00 AM End time: 08/06/2015 11:07 AM Injection made incrementally with aspirations every 5 mL.  Performed by: Personally  Anesthesiologist: Catalina Gravel  Additional Notes: Patient turned right side down.  Left chest prepped with chlorhexidine.  Needle advanced under ultrasound visualization.  Local deposited in 5cc increments with intermittent aspiration.  Patient tolerated procedure well.  Collins Scotland, MD   Procedure Name: Intubation Date/Time: 08/06/2015 11:35 AM Performed by: Scheryl Darter Pre-anesthesia Checklist: Patient identified, Emergency Drugs available, Suction available, Patient being monitored and Timeout performed Patient Re-evaluated:Patient Re-evaluated prior to inductionOxygen Delivery Method: Circle system utilized Preoxygenation: Pre-oxygenation with 100% oxygen Intubation Type: IV induction Ventilation: Mask ventilation without difficulty Laryngoscope Size: Mac and 3 Grade View: Grade I Tube type: Oral Tube size: 7.5 mm Number of attempts: 1 Airway Equipment and Method: Stylet Secured at: 22 cm Tube secured with: Tape Dental Injury: Teeth and Oropharynx as per pre-operative assessment  Comments: Intubated by Hetty Blend RN/care Link

## 2015-08-06 NOTE — Transfer of Care (Signed)
Immediate Anesthesia Transfer of Care Note  Patient: Kelly Kennedy  Procedure(s) Performed: Procedure(s): LEFT NIPPLE SPARING MASTECTOMY (Left) LEFT BREAST RECONSTRUCTION WITH PLACEMENT OF SALINE IMPLANT AND ACELLULAR DURMAL MATRIX (Left)  Patient Location: PACU  Anesthesia Type:General  Level of Consciousness: awake, patient cooperative and lethargic  Airway & Oxygen Therapy: Patient Spontanous Breathing and Patient connected to nasal cannula oxygen  Post-op Assessment: Report given to RN, Post -op Vital signs reviewed and stable and Patient moving all extremities  Post vital signs: Reviewed and stable  Last Vitals:  Filed Vitals:   08/06/15 1115  BP: 131/74  Pulse: 85  Temp:   Resp: 16    Complications: No apparent anesthesia complications

## 2015-08-06 NOTE — Anesthesia Preprocedure Evaluation (Addendum)
Anesthesia Evaluation  Patient identified by MRN, date of birth, ID band Patient awake    Reviewed: Allergy & Precautions, NPO status , Patient's Chart, lab work & pertinent test results  Airway Mallampati: II  TM Distance: >3 FB Neck ROM: Full    Dental  (+) Dental Advisory Given   Pulmonary former smoker,    breath sounds clear to auscultation       Cardiovascular hypertension, Pt. on medications  Rhythm:Regular Rate:Normal     Neuro/Psych Anxiety Depression TIA   GI/Hepatic Neg liver ROS, hiatal hernia, GERD  ,  Endo/Other  diabetes, Type 2, Oral Hypoglycemic AgentsHypothyroidism   Renal/GU negative Renal ROS     Musculoskeletal  (+) Arthritis ,   Abdominal   Peds  Hematology negative hematology ROS (+)   Anesthesia Other Findings   Reproductive/Obstetrics                            Anesthesia Physical Anesthesia Plan  ASA: III  Anesthesia Plan: General and Regional   Post-op Pain Management: GA combined w/ Regional for post-op pain   Induction: Intravenous  Airway Management Planned: Oral ETT  Additional Equipment:   Intra-op Plan:   Post-operative Plan: Extubation in OR  Informed Consent: I have reviewed the patients History and Physical, chart, labs and discussed the procedure including the risks, benefits and alternatives for the proposed anesthesia with the patient or authorized representative who has indicated his/her understanding and acceptance.   Dental advisory given  Plan Discussed with: CRNA  Anesthesia Plan Comments:         Anesthesia Quick Evaluation

## 2015-08-06 NOTE — Progress Notes (Signed)
ANTIBIOTIC CONSULT NOTE - INITIAL  Pharmacy Consult for Vancomycin Indication: surgical prophylaxis  Allergies  Allergen Reactions  . Ace Inhibitors Other (See Comments)    H/o lip swelling with change in losartan pill  . Angiotensin Receptor Blockers Other (See Comments)    H/o lip swelling with change in losartan pill  . Niacin Rash  . Red Dye Rash    Patient Measurements: Height: 5\' 3"  (160 cm) Weight: 188 lb 4.4 oz (85.401 kg) IBW/kg (Calculated) : 52.4 Adjusted Body Weight:   Vital Signs: Temp: 97.7 F (36.5 C) (09/08 1836) Temp Source: Oral (09/08 1836) BP: 138/83 mmHg (09/08 1836) Pulse Rate: 98 (09/08 1836) Intake/Output from previous day:   Intake/Output from this shift: Total I/O In: 1050 [I.V.:1000; IV Piggyback:50] Out: 500 [Urine:200; Drains:100; Blood:200]  Labs: No results for input(s): WBC, HGB, PLT, LABCREA, CREATININE in the last 72 hours. Estimated Creatinine Clearance: 64.9 mL/min (by C-G formula based on Cr of 0.78). No results for input(s): VANCOTROUGH, VANCOPEAK, VANCORANDOM, GENTTROUGH, GENTPEAK, GENTRANDOM, TOBRATROUGH, TOBRAPEAK, TOBRARND, AMIKACINPEAK, AMIKACINTROU, AMIKACIN in the last 72 hours.   Microbiology: No results found for this or any previous visit (from the past 720 hour(s)).  Medical History: Past Medical History  Diagnosis Date  . Hyperlipidemia   . Diverticulosis of colon 06/2005  . Hypothyroid   . Insomnia   . Menopausal symptoms   . Hypertension   . Hyperglycemia   . adenomatous Colon polyps     colonoscopy 8/99, 12/02, 8/06  . Pericarditis 1980's  . Chest pain     uses NTG as needed  . Depression   . Bell's palsy   . GERD (gastroesophageal reflux disease)   . H/O hiatal hernia   . Headache(784.0)   . Arthritis   . Irritable bowel   . Diabetes mellitus without complication   . TIA (transient ischemic attack)     09/2014  . Ductal carcinoma in situ (DCIS) of left breast   . Cancer     DCIS left breast  .  Family history of adverse reaction to anesthesia     " my mother didn't wake up so they had to put her on life support for about an hour or more; my sisiter has problems waking up too from anesthesia.    Medications:  Prescriptions prior to admission  Medication Sig Dispense Refill Last Dose  . amitriptyline (ELAVIL) 50 MG tablet Take 1-2 tablets (50-100 mg total) by mouth at bedtime as needed for sleep. 180 tablet 3 Past Week at Unknown time  . amLODipine (NORVASC) 5 MG tablet Take 1 tablet (5 mg total) by mouth daily. 90 tablet 3 08/06/2015 at 0800  . Aspirin-Salicylamide-Caffeine (BC HEADACHE POWDER PO) Take 1 Package by mouth daily as needed.   Past Month at Unknown time  . brimonidine-timolol (COMBIGAN) 0.2-0.5 % ophthalmic solution Place 1 drop into both eyes every 12 (twelve) hours.   08/06/2015 at 0800  . CRESTOR 10 MG tablet TAKE 1 TABLET BY MOUTH EVERY DAY 30 tablet 10 08/05/2015 at Unknown time  . diclofenac sodium (VOLTAREN) 1 % GEL Apply topically. Apply 2-4 Grams to affected area twice a day   Past Month at Unknown time  . fluocinonide cream (LIDEX) 0.05 % APPLY TOPICALLY DAILY AS NEEDED 30 g 5 Past Month at Unknown time  . furosemide (LASIX) 20 MG tablet TAKE 1 TABLET (20 MG TOTAL) BY MOUTH DAILY. 90 tablet 2 Past Week at Unknown time  . glipiZIDE (GLUCOTROL) 2.5 mg TABS tablet  Take 0.5 tablets (2.5 mg total) by mouth daily before breakfast. (Patient taking differently: Take 1.25 mg by mouth daily before breakfast. ) 60 tablet 2 08/05/2015 at Unknown time  . Hypromellose (GENTEAL OP) Apply 1 application to eye at bedtime.   Past Week at Unknown time  . levothyroxine (SYNTHROID, LEVOTHROID) 100 MCG tablet Take one tablet by mouth daily except on Monday and Wednesday take 1/2 tablet (Patient taking differently: Take 50-100 mcg by mouth daily before breakfast. Take one tablet by mouth daily except on Monday and Wednesday take 1/2 tablet) 90 tablet 3 08/06/2015 at 0800  . magnesium oxide (MAG-OX)  400 (241.3 MG) MG tablet TAKE 1 TABLET (400 MG TOTAL) BY MOUTH DAILY. 30 tablet 11 08/05/2015 at Unknown time  . Multiple Vitamins-Minerals (PRESERVISION AREDS 2) CAPS Take 2 capsules by mouth daily.   08/05/2015 at Unknown time  . nitroGLYCERIN (NITROSTAT) 0.4 MG SL tablet Place 1 tablet (0.4 mg total) under the tongue every 5 (five) minutes as needed for chest pain. 25 tablet 12 08/04/2015  . omeprazole (PRILOSEC OTC) 20 MG tablet Take 20 mg by mouth daily.   08/06/2015 at 0800  . Polyethyl Glycol-Propyl Glycol (SYSTANE OP) Apply 1 drop to eye at bedtime.   Past Week at Unknown time   Scheduled:  . amLODipine  5 mg Oral Daily  . brimonidine-timolol  1 drop Both Eyes Q12H  . Carboxymethylcell-Hypromellose   Ophthalmic QHS  . [START ON 08/07/2015] enoxaparin (LOVENOX) injection  40 mg Subcutaneous Q24H  . furosemide  20 mg Oral Daily  . HYDROmorphone      . [START ON 08/07/2015] insulin aspart  0-15 Units Subcutaneous TID WC  . [START ON 08/07/2015] insulin aspart  0-20 Units Subcutaneous TID WC  . insulin aspart  0-5 Units Subcutaneous QHS  . [START ON 08/07/2015] levothyroxine  50-100 mcg Oral QAC breakfast  . omeprazole  20 mg Oral Daily  . Polyethyl Glycol-Propyl Glycol   Ophthalmic QHS  . PRESERVISION AREDS 2  2 capsule Oral Daily  . rosuvastatin  10 mg Oral Daily   Infusions:  . dextrose 5 % and 0.9% NaCl     Assessment: 74yo female here for L simple mastectomy. Pharmacy is consulted to dose vancomycin for surgical prophylaxis. Pt is afebrile, WBC and sCr wnl.  Pt received vancomycin 1g at 1200. Goal of Therapy:  Prevention of infection  Plan:  Vancomycin 1g IV q12h Measure antibiotic drug levels at steady state Follow up culture results, renal function, removal of drain and clinical course  Andrey Cota. Diona Foley, PharmD Clinical Pharmacist Pager [phone] 08/06/2015,6:46 PM

## 2015-08-06 NOTE — H&P (Signed)
H&P   KENZLIE DISCH (MR# [id number])      H&P Info    Author Note Status Last Update User Last Update Date/Time   [name], MD Signed [name], MD 06/04/2015 9:32 AM    H&P    Expand All Collapse All   Deyra Koontz 05/22/2015 8:55 AM Location: [facility] Patient #: [id number] DOB: [birth year] Widowed / Language: Undefined / Race: Undefined Female  History of Present Illness [name] Netty Sullivant MD; 05/22/2015 12:26 PM) Patient words: po lumpectomy  PT RETURNS 2 WEEKS AFTER LEFT BREAST LUMPECTOMY FOR DCIS  7.8 CM IN SIZE WITH CLOSE MARGINS ANTERIOR AND POSTERIOR BEING LESS THAN 1 MM. SHE HAS A DRAIN INPLACE AS WELL. SHE IS DOING WELL.                Diagnosis 1. Breast, partial mastectomy, Left - INTERMEDIATE GRADE DUCTAL CARCINOMA IN SITU WITH ASSOCIATED CALCIFICATIONS, SPANNING 7.9 CM IN GREATEST DIMENSION. - DUCTAL CARCINOMA IN SITU IS EXTREMELY CLOSE (LESS THAN 0.01CM) TO ANTERIOR AND POSTERIOR MARGINS. - DUCTAL CARCINOMA IN SITU IS CLOSE (LESS THAN 0.1 CM) TO LATERAL MARGIN. - DUCTAL CARCINOMA IN SITU IS 0.2 CM FROM SUPERIOR MARGIN. - DUCTAL CARCINOMA IN SITU IS 0.25 CM TO INFERIOR MARGIN. - MEDIAL MARGIN IS NEGATIVE. - SEE ONCOLOGY TEMPLATE 2. Lymph node, sentinel, biopsy, Left axilla - ONE BENIGN LYMPH NODE WITH NO TUMOR SEEN (0/1). 3. Lymph node, sentinel, biopsy, Left axilla - ONE BENIGN LYMPH NODE WITH NO TUMOR SEEN (0/1). Microscopic Comment 1. BREAST, IN SITU CARCINOMA Specimen, including laterality: Left partial breast with left sentinel lymph node sampling. Procedure (include lymph node sampling sentinel-non-sentinel): Left partial mastectomy with left sentinel lymph node biopsies. Grade of carcinoma: Intermediate grade. Necrosis: Necrosis was identified on previous biopsy ([id number]). Estimated tumor size: (gross measurement): 7.9 cm. Treatment effect: Not applicable. Distance to closest margin: Tumor is  extremely close (less than 0.01 cm) to anterior and posterior margins; tumor is also close (less than 0.1 cm) to lateral margin. Breast prognostic profile: Performed on previous case [phone]. Estrogen receptor: 100%, positive. Progesterone receptor: 98%, positive. Lymph nodes: Examined: 2 Sentinel. 0 Non-sentinel. 1 of 3 FINAL for Mccutcheon, Para L ([id number]) Microscopic Comment(continued) 2 Total. Lymph nodes with metastasis: 0. Isolated tumor cells (< 0.2 mm): 0. Micrometastasis ( > 0.2 mm and < 2.0 mm): 0. Macrometastasis (> 2.0 mm): 0. Extranodal extension: Not applicable. TNM: pTis, pN0. Comments: In addition to the ductal carcinoma in situ, fibrocystic changes with usual ductal hyperplasia and fibroadenoma formation is present. (RH:ds 05/08/15) [name] MD Pathologist, Electronic Signature (Case signed 05/08/2015) Specimen Gross and Clinical Information Specimen(s) Obtained: 1. Breast, partial mastectomy, Left 2. Lymph node, sentinel, biopsy, Left axilla 3. Lymph node, sentinel, biopsy, Left axilla Specimen Clinical Information.  The patient is a 74 year old female    Allergies [name], CMA; 05/22/2015 8:55 AM) Niacin *VITAMINS* Rash.  Medication History [name], CMA; 05/22/2015 8:56 AM) TraMADol HCl (50MG  Tablet, Oral) Active. Amitriptyline HCl (50MG  Tablet, Oral) Active. Amoxicillin (875MG  Tablet, Oral) Active. Combigan (0.2-0.5% Solution, Ophthalmic) Active. Azithromycin (250MG  Tablet, Oral) Active. Crestor (10MG  Tablet, Oral) Active. Durezol (0.05% Emulsion, Ophthalmic) Active. Estradiol (0.1MG /24HR Patch Weekly, Transdermal) Active. Fluconazole (150MG  Tablet, Oral) Active. Fluocinonide (0.05% Cream, External) Active. Furosemide (20MG  Tablet, Oral) Active. GlipiZIDE (5MG  Tablet, Oral) Active. Ketorolac Tromethamine (0.4% Solution, Ophthalmic) Active. Klor-Con 10 (10MEQ Tablet ER, Oral) Active. Losartan Potassium (100MG   Tablet, Oral) Active. Levothyroxine Sodium (100MCG Tablet, Oral) Active. Magnesium Oxide (400 (  241.3 Mg)MG Tablet, Oral) Active. Nitrostat (0.4MG  Tab Sublingual, Sublingual) Active. Ofloxacin (0.3% Solution, Ophthalmic) Active. Voltaren (1% Gel, Transdermal) Active. Refresh (1% Solution, Ophthalmic) Active. Systane Balance (0.6% Solution, Ophthalmic) Active. Medications Reconciled  Vitals [name] CMA; 05/22/2015 8:56 AM) 05/22/2015 8:56 AM Weight: 189 lb Height: 63in Body Surface Area: 1.95 m Body Mass Index: 33.48 kg/m Temp.: 98.44F(Oral)  Pulse: 66 (Regular)  BP: 130/70 (Sitting, Left Arm, Standard)    Physical Exam (Eriyanna Kofoed A. Nedda Gains MD; 05/22/2015 12:26 PM) Breast Note: DRAIN REMOVED MODERATED VOLUME LOSS LEFT BREAST NO REDNESS OR DRAINAGE     Assessment & Plan (Beverley Allender A. Sherriann Szuch MD; 05/22/2015 12:27 PM) POST-OPERATIVE STATE (V45.89  Z98.89) Impression: pt desires left NSM since reexcision recommended and cosmesis an issue now. Her sister had breast cancer and HAD mastectomy with reconstruction and has seen Dr Harlow Mares. Will refer to Dr Harlow Mares for reconstruction options but pt wants to keep her nipple which is possible. Explained the nipple may die with this surgery. Risk of bleeding infection flap loss the need for other surgery discussed. Discussed treatment options for breast cancer to include breast conservation vs mastectomy with reconstruction. Pt has decided on mastectomy. Risk include bleeding, infection, flap necrosis, pain, numbness, recurrence, hematoma, other surgery needs. Pt understands and agrees to proceed. Current Plans  Pt Education - [employer] Free Text Education/Instructions: discussed with patient and provided information.   Signed by [name], MD (05/22/2015 12:27 PM)

## 2015-08-06 NOTE — Progress Notes (Signed)
While reviewing pt's home meds, pt reported that she had taken 2 SL NTG on Tuesday for chest pain in left breast area. PT denied radiation of pain, shortness of breath or nausea but reported "sweating because I had another hot flash". Pt reported pain felt "like a toothache", lasted about an hour and occurred while sitting. Pt reports this as a new pain. Dr. Suzette Battiest called and informed with no new orders given at present. Dr. Ola Spurr at bedside to speak with pt at 1050.

## 2015-08-07 ENCOUNTER — Other Ambulatory Visit: Payer: Self-pay | Admitting: Family Medicine

## 2015-08-07 ENCOUNTER — Encounter (HOSPITAL_COMMUNITY): Payer: Self-pay | Admitting: Surgery

## 2015-08-07 DIAGNOSIS — E039 Hypothyroidism, unspecified: Secondary | ICD-10-CM | POA: Diagnosis not present

## 2015-08-07 DIAGNOSIS — D242 Benign neoplasm of left breast: Secondary | ICD-10-CM | POA: Diagnosis not present

## 2015-08-07 DIAGNOSIS — D0512 Intraductal carcinoma in situ of left breast: Secondary | ICD-10-CM | POA: Diagnosis not present

## 2015-08-07 DIAGNOSIS — G4700 Insomnia, unspecified: Secondary | ICD-10-CM | POA: Diagnosis not present

## 2015-08-07 DIAGNOSIS — F419 Anxiety disorder, unspecified: Secondary | ICD-10-CM | POA: Diagnosis not present

## 2015-08-07 DIAGNOSIS — E782 Mixed hyperlipidemia: Secondary | ICD-10-CM | POA: Diagnosis not present

## 2015-08-07 DIAGNOSIS — N6012 Diffuse cystic mastopathy of left breast: Secondary | ICD-10-CM | POA: Diagnosis not present

## 2015-08-07 DIAGNOSIS — R509 Fever, unspecified: Secondary | ICD-10-CM | POA: Diagnosis not present

## 2015-08-07 DIAGNOSIS — N641 Fat necrosis of breast: Secondary | ICD-10-CM | POA: Diagnosis not present

## 2015-08-07 LAB — BASIC METABOLIC PANEL
ANION GAP: 8 (ref 5–15)
BUN: 7 mg/dL (ref 6–20)
CALCIUM: 8.4 mg/dL — AB (ref 8.9–10.3)
CO2: 24 mmol/L (ref 22–32)
Chloride: 101 mmol/L (ref 101–111)
Creatinine, Ser: 0.79 mg/dL (ref 0.44–1.00)
GFR calc Af Amer: >60 mL/min (ref >60)
GLUCOSE: 134 mg/dL — AB (ref 65–99)
POTASSIUM: 3.5 mmol/L (ref 3.5–5.1)
Sodium: 133 mmol/L — ABNORMAL LOW (ref 135–145)

## 2015-08-07 LAB — GLUCOSE, CAPILLARY
GLUCOSE-CAPILLARY: 118 mg/dL — AB (ref 65–99)
GLUCOSE-CAPILLARY: 154 mg/dL — AB (ref 65–99)
GLUCOSE-CAPILLARY: 100 mg/dL — AB (ref 65–99)
Glucose-Capillary: 126 mg/dL — ABNORMAL HIGH (ref 65–99)

## 2015-08-07 LAB — CBC
HEMATOCRIT: 30.8 % — AB (ref 36.0–46.0)
Hemoglobin: 10.3 g/dL — ABNORMAL LOW (ref 12.0–15.0)
MCH: 29.3 pg (ref 26.0–34.0)
MCHC: 33.4 g/dL (ref 30.0–36.0)
MCV: 87.5 fL (ref 78.0–100.0)
PLATELETS: 283 10*3/uL (ref 150–400)
RBC: 3.52 MIL/uL — ABNORMAL LOW (ref 3.87–5.11)
RDW: 14.2 % (ref 11.5–15.5)
WBC: 11.6 10*3/uL — AB (ref 4.0–10.5)

## 2015-08-07 MED ORDER — INFLUENZA VAC SPLIT QUAD 0.5 ML IM SUSY
0.5000 mL | PREFILLED_SYRINGE | INTRAMUSCULAR | Status: DC
  Filled 2015-08-07: qty 0.5

## 2015-08-07 NOTE — Telephone Encounter (Signed)
Last filled 06/2014 #180 with 3 refills--last OV with you was 07/20/15 acute visit--looks like pt had surgery yesterday in reference to breast cancer--please advise if okay to refill

## 2015-08-07 NOTE — Op Note (Signed)
NAMEINFANTOF, VILLAGOMEZ NO.:  [id number]  MEDICAL RECORD NO.:  [id number]  LOCATION:  6N31C                        FACILITY:  [facility]  PHYSICIAN:  Crissie Reese, M.D.     DATE OF BIRTH:  Jun 17, 1941  DATE OF PROCEDURE:  08/06/2015 DATE OF DISCHARGE:                              OPERATIVE REPORT   PREOPERATIVE DIAGNOSIS:  Left breast cancer.  POSTOPERATIVE DIAGNOSIS:  Left breast cancer.  PROCEDURE PERFORMED: 1. Immediate left breast reconstruction with saline implant. 2. Chest wall reconstruction for inadequate muscle and reset of     inframammary fold using acellular dermal matrix.  SURGEON:  Crissie Reese, M.D.  ASSISTANT:  Judyann Munson, RNFA.  ANESTHESIA:  General.  ESTIMATED BLOOD LOSS:  15 mL.  DRAINS:  Two 19-French.  CLINICAL NOTE:  A 74 year old woman has left breast cancer and has already had couple of lumpectomies with margins that continue to be positive.  Mastectomy recommended and she was interested in reconstruction.  Options discussed and she selected nipple-sparing mastectomy with immediate reconstruction using either an expander or an implant depending upon conditions of surgery.  She did prefer saline implant rather than silicone gel.  The nature of the procedure and risks plus complications were discussed with her in detail.  These risks included, but not limited to, bleeding, infection, healing problems, scarring, loss of sensation, loss of sensation in the nipple, fluid accumulations, pneumothorax, DVT, PE, failure of the device, capsular contracture, displacement of device, wrinkles and ripples, asymmetry, contour deformities both overlying the reconstruction as well as at the periphery, asymmetry, loss of tissue, loss of skin, loss of nipple, chronic pain, and overall disappointment.  She understood all of this and wished to proceed.  In addition, the use of acellular dermal matrix was discussed with her, and she understood the  nature of that material and the rationale for its use.  She consented to its use.  DESCRIPTION OF PROCEDURE:  The patient was taken to the operating room, and General Surgery completed the left nipple-sparing mastectomy.  The skin flaps were inspected as well as the nipple and all appeared to have excellent color and viable.  The space was inspected and noted to be in good condition and irrigated with saline.  The pectoralis major muscle with the serratus anterior elevated beginning inferiorly and proceeding cephalad.  The serratus was lifted intramuscular and great care was taken throughout this procedure to avoid injury to the pulmonary cavity. Thorough irrigation with saline as well as antibiotic solution. Hemostasis with electrocautery.  A Sizer was prepared soaking in antibiotic solution.  After cleaning gloves, the Sizer was positioned and filled to 500 mL sterile saline using a closed filling system and it was felt to give her fairly good symmetry in terms of volume with respect to the opposite side.  This was removed and again hemostasis with electrocautery.  Irrigation with antibiotic solution.  The acellular dermal matrix (Flex HD) had been soaked in saline for well over 25 minutes and it was also soaked in antibiotic solution for an additional 15 minutes.  It was pie-crusted.  It was applied a small piece lateral and another piece inferior.  Care was taken to make sure  it was oriented with the dermal side facing up towards the overlying mastectomy flaps and the epidermal side facing down towards the submuscular space.  3-0 PDS sutures were used to secure it.  It was placed lateral and then at the inferior aspect it was sutured at the new inframammary fold and it was used to define this fold.  Superiorly, it was left free for the moment.  An implant was then brought into the field and soaked in antibiotic solution.  This was a Mentor smooth round high-profile saline implant.   After thoroughly cleaning gloves, the implant was prepared, 300 mL sterile saline placed into closed filling system and the antibiotic solution then placed in this space and the implant was then positioned with care taken to make sure that the fill port was located directly behind the nipple-areolar complex.  The implant was filled with the maximum 500 mL sterile saline using a closed filling system.  The acellular dermal matrix was then cut to the appropriate amount to fit the area where there was no muscle coverage and then it was sutured along the superior border to the muscle using 3- 0 PDS simple interrupted sutures with great care taken to avoid damage to underlying implant, which was kept under direct vision at all times. Antibiotic solution again placed and two 19-French drains positioned, 1 medial and 1 lateral, brought through separate stab wounds inferomedial and inferolateral and secured with 3-0 Prolene sutures.  The superior border of the inframammary incision was then trimmed in order to protect from wound dehiscence where this soft tissue had been retracted during the nipple-sparing mastectomy.  The bright red bleeding along the edge was noted and the closure with 3-0 Monocryl interrupted deep dermal sutures with a few interspersed 3-0 Prolene sutures for additional reinforcement.  The drains were dressed with Bio-Patch and SorbaView dressings and Dermabond placed on the wounds and ABDs, Ace wrap, as well as the chest vest positioned and she was transferred to the recovery room stable having tolerated the procedure well.  DISPOSITION:  She will be admitted to the hospital.     Crissie Reese, M.D.     DB/MEDQ  D:  08/06/2015  T:  08/07/2015  Job:  [id number]  cc:   Crissie Reese, M.D.

## 2015-08-07 NOTE — Progress Notes (Signed)
1 Day Post-Op  Subjective: DOING WELL   Objective: Vital signs in last 24 hours: Temp:  [97.2 F (36.2 C)-99.9 F (37.7 C)] 99.7 F (37.6 C) (09/09 0505) Pulse Rate:  [79-109] 107 (09/09 0505) Resp:  [13-22] 16 (09/09 0505) BP: (104-143)/(53-83) 115/53 mmHg (09/09 0505) SpO2:  [92 %-100 %] 94 % (09/09 0505) Weight:  [85.401 kg (188 lb 4.4 oz)-90.719 kg (200 lb)] 90.719 kg (200 lb) (09/08 1836) Last BM Date: 08/05/15  Intake/Output from previous day: 09/08 0701 - 09/09 0700 In: 2336 [I.V.:2286; IV Piggyback:50] Out: 9470 [Urine:825; Drains:230; Blood:200] Intake/Output this shift:    Incision/Wound:LEFT MASTECTOMY SITE LOOKS WELL  NIPPLE INTACT NO HEMATOMA AND DRAINAGE SCANT   Lab Results:   Recent Labs  08/06/15 2145 08/07/15 0407  WBC 12.7* 11.6*  HGB 11.5* 10.3*  HCT 34.6* 30.8*  PLT 296 283   BMET  Recent Labs  08/06/15 2145 08/07/15 0407  NA  --  133*  K  --  3.5  CL  --  101  CO2  --  24  GLUCOSE  --  134*  BUN  --  7  CREATININE 0.75 0.79  CALCIUM  --  8.4*   PT/INR No results for input(s): LABPROT, INR in the last 72 hours. ABG No results for input(s): PHART, HCO3 in the last 72 hours.  Invalid input(s): PCO2, PO2  Studies/Results: No results found.  Anti-infectives: Anti-infectives    Start     Dose/Rate Route Frequency Ordered Stop   08/06/15 2300  vancomycin (VANCOCIN) IVPB 1000 mg/200 mL premix     1,000 mg 200 mL/hr over 60 Minutes Intravenous Every 12 hours 08/06/15 1850     08/06/15 1115  bacitracin 50,000 Units, gentamicin (GARAMYCIN) 80 mg, ceFAZolin (ANCEF) 1 g in sodium chloride 0.9 % 1,000 mL      Irrigation Once 08/06/15 1100 08/06/15 1428   08/06/15 1000  vancomycin (VANCOCIN) IVPB 1000 mg/200 mL premix     1,000 mg 200 mL/hr over 60 Minutes Intravenous To [facility] 08/06/15 0948 08/06/15 1300   08/06/15 1000  ceFAZolin (ANCEF) 3 g in dextrose 5 % 50 mL IVPB     3 g 160 mL/hr over 30 Minutes Intravenous To  [facility] 08/06/15 0952 08/06/15 1138      Assessment/Plan: s/p Procedure(s): LEFT NIPPLE SPARING MASTECTOMY (Left) LEFT BREAST RECONSTRUCTION WITH PLACEMENT OF SALINE IMPLANT AND ACELLULAR DURMAL MATRIX (Left) DOING WELL HOME Saturday PER DR BOWERS APPROVAL  LOS: 1 day    Ilana Prezioso A. 08/07/2015

## 2015-08-07 NOTE — Progress Notes (Signed)
Subjective: Sore. Good pain control. Has not ambulated much yet.  Objective: Vital signs in last 24 hours: Temp:  [97.2 F (36.2 C)-99.9 F (37.7 C)] 99.7 F (37.6 C) (09/09 0505) Pulse Rate:  [79-109] 107 (09/09 0505) Resp:  [13-22] 16 (09/09 0505) BP: (104-143)/(53-83) 115/53 mmHg (09/09 0505) SpO2:  [92 %-100 %] 94 % (09/09 0505) Weight:  [188 lb 4.4 oz (85.401 kg)-200 lb (90.719 kg)] 200 lb (90.719 kg) (09/08 1836)  Intake/Output from previous day: 09/08 0701 - 09/09 0700 In: 2336 [I.V.:2286; IV Piggyback:50] Out: 4827 [Urine:825; Drains:230; Blood:200] Intake/Output this shift:    Operative sites: Mastectomy flaps viable. Nipple complex appears healthy. Implant appears to be in good position. Drains functioning. Drainage thin. No evidence of bleeding, infection, or vascular compromise.   Recent Labs  08/06/15 2145 08/07/15 0407  WBC 12.7* 11.6*  HGB 11.5* 10.3*  HCT 34.6* 30.8*  NA  --  133*  K  --  3.5  CL  --  101  CO2  --  24  BUN  --  7  CREATININE 0.75 0.79    Studies/Results: No results found.  Assessment/Plan: Needs to ambulate. Needs spirometer. Needs a couple of more doses of IV antibiotics for surgical prophylaxis.Overall progressing well.   LOS: 1 day    Kelly Kennedy M 08/07/2015 8:31 AM

## 2015-08-07 NOTE — Anesthesia Postprocedure Evaluation (Addendum)
  Anesthesia Post-op Note  Patient: Kelly Kennedy  Procedure(s) Performed: Procedure(s): LEFT NIPPLE SPARING MASTECTOMY (Left) LEFT BREAST RECONSTRUCTION WITH PLACEMENT OF SALINE IMPLANT AND ACELLULAR DURMAL MATRIX (Left)  Patient Location: PACU  Anesthesia Type:GA combined with regional for post-op pain  Level of Consciousness: awake and alert   Airway and Oxygen Therapy: Patient Spontanous Breathing  Post-op Pain: mild  Post-op Assessment: Post-op Vital signs reviewed              Post-op Vital Signs: Reviewed  Last Vitals:  Filed Vitals:   08/07/15 0900  BP: 118/65  Pulse: 102  Temp: 37.3 C  Resp: 16    Complications: No apparent anesthesia complications

## 2015-08-07 NOTE — Telephone Encounter (Signed)
This is okay to fill and I sent the rx with the assumption that the medicine isn't changed at discharge.  Please call patient's home and leave her a message about the refill.  Thanks.

## 2015-08-08 DIAGNOSIS — R509 Fever, unspecified: Secondary | ICD-10-CM | POA: Diagnosis not present

## 2015-08-08 DIAGNOSIS — D0512 Intraductal carcinoma in situ of left breast: Secondary | ICD-10-CM | POA: Diagnosis not present

## 2015-08-08 DIAGNOSIS — D242 Benign neoplasm of left breast: Secondary | ICD-10-CM | POA: Diagnosis not present

## 2015-08-08 DIAGNOSIS — G4700 Insomnia, unspecified: Secondary | ICD-10-CM | POA: Diagnosis not present

## 2015-08-08 DIAGNOSIS — N641 Fat necrosis of breast: Secondary | ICD-10-CM | POA: Diagnosis not present

## 2015-08-08 DIAGNOSIS — E782 Mixed hyperlipidemia: Secondary | ICD-10-CM | POA: Diagnosis not present

## 2015-08-08 DIAGNOSIS — F419 Anxiety disorder, unspecified: Secondary | ICD-10-CM | POA: Diagnosis not present

## 2015-08-08 DIAGNOSIS — N6012 Diffuse cystic mastopathy of left breast: Secondary | ICD-10-CM | POA: Diagnosis not present

## 2015-08-08 DIAGNOSIS — E039 Hypothyroidism, unspecified: Secondary | ICD-10-CM | POA: Diagnosis not present

## 2015-08-08 LAB — CBC
HEMATOCRIT: 33.1 % — AB (ref 36.0–46.0)
HEMOGLOBIN: 11.1 g/dL — AB (ref 12.0–15.0)
MCH: 29.5 pg (ref 26.0–34.0)
MCHC: 33.5 g/dL (ref 30.0–36.0)
MCV: 88 fL (ref 78.0–100.0)
Platelets: 297 10*3/uL (ref 150–400)
RBC: 3.76 MIL/uL — ABNORMAL LOW (ref 3.87–5.11)
RDW: 14.4 % (ref 11.5–15.5)
WBC: 10.7 10*3/uL — AB (ref 4.0–10.5)

## 2015-08-08 LAB — BASIC METABOLIC PANEL
ANION GAP: 8 (ref 5–15)
BUN: 6 mg/dL (ref 6–20)
CHLORIDE: 101 mmol/L (ref 101–111)
CO2: 26 mmol/L (ref 22–32)
Calcium: 8.9 mg/dL (ref 8.9–10.3)
Creatinine, Ser: 0.77 mg/dL (ref 0.44–1.00)
GFR calc non Af Amer: >60 mL/min (ref >60)
GLUCOSE: 127 mg/dL — AB (ref 65–99)
POTASSIUM: 3.4 mmol/L — AB (ref 3.5–5.1)
Sodium: 135 mmol/L (ref 135–145)

## 2015-08-08 LAB — GLUCOSE, CAPILLARY: GLUCOSE-CAPILLARY: 118 mg/dL — AB (ref 65–99)

## 2015-08-08 MED ORDER — SULFAMETHOXAZOLE-TRIMETHOPRIM 800-160 MG PO TABS: 1.0000 | ORAL_TABLET | Freq: Two times a day (BID) | ORAL | Status: DC

## 2015-08-08 MED ORDER — METHOCARBAMOL 500 MG PO TABS
500.0000 mg | ORAL_TABLET | Freq: Four times a day (QID) | ORAL | Status: DC | PRN
Start: 2015-08-08 — End: 2015-09-22

## 2015-08-08 MED ORDER — OXYCODONE HCL 5 MG PO TABS: 5.0000 mg | ORAL_TABLET | ORAL | Status: DC | PRN

## 2015-08-08 NOTE — Progress Notes (Signed)
Pt states she will take home meds once at home.  Pt has Rx instructions breast cancer bag given and explained.  Family at bedside for instructions also.

## 2015-08-08 NOTE — Discharge Summary (Signed)
Physician Discharge Summary  Patient ID: Kelly Kennedy MRN: [id number] DOB/AGE: 1941/04/03 74 y.o.  Admit date: 08/06/2015 Discharge date: 08/08/2015  Admission Diagnoses: Left breast cancer   Discharge Diagnoses: Same Active Problems:   Neoplasm of left breast, primary tumor staging category Tis: ductal carcinoma in situ (DCIS)   Breast cancer, female   Discharged Condition: good  Hospital Course: On the day of admission the patient was taken to surgery and had left mastectomy with implant reconstruction. The patient tolerated the procedures well. Postoperatively, the mastectomy flap and nipple complex maintained excellent color and capillary refill. The patient was tolerating diet on the first postoperative day. She was ambulating well by the second post-op day and is now ready for discharge. DVT and surgical antibiotic prophylaxis were continued throughout the hospital stay.  Treatments: antibiotics: vancomycin, anticoagulation: LMW heparin and surgery: left mastectomy and implant reconstruction  Discharge Exam: Blood pressure 120/72, pulse 107, temperature 99.1 F (37.3 C), temperature source Oral, resp. rate 16, height 5\' 2"  (1.575 m), weight 200 lb (90.719 kg), SpO2 98 %.  Operative sites: Mastectomy flaps viable. Nipple complex appears viable. Implant in good position. Drains functioning. Drainage thin. There is no evidence of bleeding or infection.  Disposition: 01-Home or Self Care     Medication List    STOP taking these medications        amitriptyline 50 MG tablet  Commonly known as:  ELAVIL     BC HEADACHE POWDER PO     diclofenac sodium 1 % Gel  Commonly known as:  VOLTAREN     PRESERVISION AREDS 2 Caps      TAKE these medications        amLODipine 5 MG tablet  Commonly known as:  NORVASC  Take 1 tablet (5 mg total) by mouth daily.     COMBIGAN 0.2-0.5 % ophthalmic solution  Generic drug:  brimonidine-timolol  Place 1 drop into both eyes every 12  (twelve) hours.     CRESTOR 10 MG tablet  Generic drug:  rosuvastatin  TAKE 1 TABLET BY MOUTH EVERY DAY     fluocinonide cream 0.05 %  Commonly known as:  LIDEX  APPLY TOPICALLY DAILY AS NEEDED     furosemide 20 MG tablet  Commonly known as:  LASIX  TAKE 1 TABLET (20 MG TOTAL) BY MOUTH DAILY.     GENTEAL OP  Apply 1 application to eye at bedtime.     glipiZIDE 2.5 mg Tabs tablet  Commonly known as:  GLUCOTROL  Take 0.5 tablets (2.5 mg total) by mouth daily before breakfast.     levothyroxine 100 MCG tablet  Commonly known as:  SYNTHROID, LEVOTHROID  Take one tablet by mouth daily except on Monday and Wednesday take 1/2 tablet     magnesium oxide 400 (241.3 MG) MG tablet  Commonly known as:  MAG-OX  TAKE 1 TABLET (400 MG TOTAL) BY MOUTH DAILY.     methocarbamol 500 MG tablet  Commonly known as:  ROBAXIN  Take 1 tablet (500 mg total) by mouth every 6 (six) hours as needed for muscle spasms.     nitroGLYCERIN 0.4 MG SL tablet  Commonly known as:  NITROSTAT  Place 1 tablet (0.4 mg total) under the tongue every 5 (five) minutes as needed for chest pain.     omeprazole 20 MG tablet  Commonly known as:  PRILOSEC OTC  Take 20 mg by mouth daily.     oxyCODONE 5 MG immediate release tablet  Commonly  known as:  Oxy IR/ROXICODONE  Take 1-2 tablets (5-10 mg total) by mouth every 4 (four) hours as needed for moderate pain.     sulfamethoxazole-trimethoprim 800-160 MG per tablet  Commonly known as:  BACTRIM DS,SEPTRA DS  Take 1 tablet by mouth every 12 (twelve) hours.     SYSTANE OP  Apply 1 drop to eye at bedtime.           Follow-up Information    Follow up with CORNETT,THOMAS A., MD. Schedule an appointment as soon as possible for a visit in 2 weeks.   Specialty:  General Surgery   Contact information:   [address] [place] [address] [phone]       Signed: Macon Large 08/08/2015, 9:11 AM

## 2015-08-08 NOTE — Progress Notes (Signed)
2 Days Post-Op  Subjective: Fever yesterday; resolved with incentive spirometer Still with moderate pain, but improving  Objective: Vital signs in last 24 hours: Temp:  [98.2 F (36.8 C)-101.4 F (38.6 C)] 99.1 F (37.3 C) (09/10 0559) Pulse Rate:  [100-107] 107 (09/10 0559) Resp:  [16] 16 (09/10 0559) BP: (113-120)/(53-82) 120/72 mmHg (09/10 0559) SpO2:  [95 %-100 %] 98 % (09/10 0559) Last BM Date: 08/05/15  Intake/Output from previous day: 09/09 0701 - 09/10 0700 In: 3875 [P.O.:840; I.V.:519] Out: 2015 [Urine:1925; Drains:90] Intake/Output this shift:    Lungs clear Dressings intact  Lab Results:   Recent Labs  08/07/15 0407 08/08/15 0344  WBC 11.6* 10.7*  HGB 10.3* 11.1*  HCT 30.8* 33.1*  PLT 283 297   BMET  Recent Labs  08/07/15 0407 08/08/15 0344  NA 133* 135  K 3.5 3.4*  CL 101 101  CO2 24 26  GLUCOSE 134* 127*  BUN 7 6  CREATININE 0.79 0.77  CALCIUM 8.4* 8.9   PT/INR No results for input(s): LABPROT, INR in the last 72 hours. ABG No results for input(s): PHART, HCO3 in the last 72 hours.  Invalid input(s): PCO2, PO2  Studies/Results: No results found.  Anti-infectives: Anti-infectives    Start     Dose/Rate Route Frequency Ordered Stop   08/06/15 2300  vancomycin (VANCOCIN) IVPB 1000 mg/200 mL premix     1,000 mg 200 mL/hr over 60 Minutes Intravenous Every 12 hours 08/06/15 1850     08/06/15 1115  bacitracin 50,000 Units, gentamicin (GARAMYCIN) 80 mg, ceFAZolin (ANCEF) 1 g in sodium chloride 0.9 % 1,000 mL      Irrigation Once 08/06/15 1100 08/06/15 1428   08/06/15 1000  vancomycin (VANCOCIN) IVPB 1000 mg/200 mL premix     1,000 mg 200 mL/hr over 60 Minutes Intravenous To [facility] 08/06/15 0948 08/06/15 1300   08/06/15 1000  ceFAZolin (ANCEF) 3 g in dextrose 5 % 50 mL IVPB     3 g 160 mL/hr over 30 Minutes Intravenous To [facility] 08/06/15 0952 08/06/15 1138      Assessment/Plan: s/p Procedure(s): LEFT NIPPLE  SPARING MASTECTOMY (Left) LEFT BREAST RECONSTRUCTION WITH PLACEMENT OF SALINE IMPLANT AND ACELLULAR DURMAL MATRIX (Left)  Ambulate, IS Plans per Dr. Harlow Mares regarding discharge  LOS: 2 days    Dierdra Salameh A 08/08/2015

## 2015-08-08 NOTE — Discharge Instructions (Addendum)
[facility] °[phone] ° °MASTECTOMY: POST OP INSTRUCTIONS ° °Always review your discharge instruction sheet given to you by the facility where your surgery was performed. °IF YOU HAVE DISABILITY OR FAMILY LEAVE FORMS, YOU MUST BRING THEM TO THE OFFICE FOR PROCESSING.   °DO NOT GIVE THEM TO YOUR DOCTOR. °A prescription for pain medication may be given to you upon discharge.  Take your pain medication as prescribed, if needed.  If narcotic pain medicine is not needed, then you may take acetaminophen (Tylenol) or ibuprofen (Advil) as needed. °1. Take your usually prescribed medications unless otherwise directed. °2. If you need a refill on your pain medication, please contact your pharmacy.  They will contact our office to request authorization.  Prescriptions will not be filled after 5pm or on week-ends. °3. You should follow a light diet the first few days after arrival home, such as soup and crackers, etc.  Resume your normal diet the day after surgery. °4. Most patients will experience some swelling and bruising on the chest and underarm.  Ice packs will help.  Swelling and bruising can take several days to resolve.  °5. It is common to experience some constipation if taking pain medication after surgery.  Increasing fluid intake and taking a stool softener (such as Colace) will usually help or prevent this problem from occurring.  A mild laxative (Milk of Magnesia or Miralax) should be taken according to package instructions if there are no bowel movements after 48 hours. °6. Unless discharge instructions indicate otherwise, leave your bandage dry and in place until your next appointment in 3-5 days.  You may take a limited sponge bath.  No tube baths or showers until the drains are removed.  You may have steri-strips (small skin tapes) in place directly over the incision.  These strips should be left on the skin for 7-10 days.  If your surgeon used skin glue on the incision, you may  shower in 24 hours.  The glue will flake off over the next 2-3 weeks.  Any sutures or staples will be removed at the office during your follow-up visit. °7. DRAINS:  If you have drains in place, it is important to keep a list of the amount of drainage produced each day in your drains.  Before leaving the hospital, you should be instructed on drain care.  Call our office if you have any questions about your drains. °8. ACTIVITIES:  You may resume regular (light) daily activities beginning the next day--such as daily self-care, walking, climbing stairs--gradually increasing activities as tolerated.  You may have sexual intercourse when it is comfortable.  Refrain from any heavy lifting or straining until approved by your doctor. °a. You may drive when you are no longer taking prescription pain medication, you can comfortably wear a seatbelt, and you can safely maneuver your car and apply brakes. °b. RETURN TO WORK:  __________________________________________________________ °9. You should see your doctor in the office for a follow-up appointment approximately 3-5 days after your surgery.  Your doctor’s nurse will typically make your follow-up appointment when she calls you with your pathology report.  Expect your pathology report 2-3 business days after your surgery.  You may call to check if you do not hear from us after three days.   °10. OTHER INSTRUCTIONS: ______________________________________________________________________________________________ ____________________________________________________________________________________________ °WHEN TO CALL YOUR DOCTOR: °1. Fever over 101.0 °2. Nausea and/or vomiting °3. Extreme swelling or bruising °4. Continued bleeding from incision. °5. Increased pain, redness, or drainage from the incision. °  The clinic staff is available to answer your questions during regular business hours.  Please dont hesitate to call and ask to speak to one of the nurses for clinical  concerns.  If you have a medical emergency, go to the nearest emergency room or call 911.  A surgeon from [facility] is always on call at the hospital. [address] [place], [place], [place]  [address] ? [address], [place], [place]   [address] [phone] ? [phone] ? FAX [phone] Web site: www.centNo lifting for 6 weeks No vigorous activity for 6 weeks (including outdoor walks) No driving for 4 weeks OK to walk up stairs slowly Stay propped up Use incentive spirometer at home every hour while awake No shower while drains are in place Empty drains at least three times a day and record the amounts separately Change drain dressings every third day if instructed to do so by Dr. Harlow Mares  Apply Bacitracin antibiotic ointment to the drain sites  Place gauze dressing over drains  Secure the gauze with tape Take an over-the-counter Probiotic while on antibiotics Take an over-the-counter stool softener (such as Colace) while on pain medication See Dr. Harlow Mares in the office this next week as scheduled For questions call [phone] or [phone]

## 2015-08-10 ENCOUNTER — Other Ambulatory Visit: Payer: Self-pay | Admitting: Family Medicine

## 2015-08-19 ENCOUNTER — Telehealth: Payer: Self-pay | Admitting: Nurse Practitioner

## 2015-08-19 NOTE — Telephone Encounter (Signed)
Called patient and she is aware of her appointment change per pof  anne

## 2015-08-20 ENCOUNTER — Encounter (HOSPITAL_COMMUNITY): Payer: Self-pay | Admitting: Surgery

## 2015-08-23 ENCOUNTER — Other Ambulatory Visit: Payer: Self-pay | Admitting: Family Medicine

## 2015-08-24 NOTE — Telephone Encounter (Signed)
Per last refill encounter, patient notified of need for follow up.  Also, rx had been refilled on 08/10/15 for #90 x1 refill.  Verified receipt with pharmacy.

## 2015-08-25 ENCOUNTER — Encounter: Payer: Self-pay | Admitting: Oncology

## 2015-08-25 ENCOUNTER — Ambulatory Visit (INDEPENDENT_AMBULATORY_CARE_PROVIDER_SITE_OTHER): Payer: Medicare HMO | Admitting: Family Medicine

## 2015-08-25 ENCOUNTER — Telehealth: Payer: Self-pay | Admitting: Oncology

## 2015-08-25 ENCOUNTER — Ambulatory Visit (HOSPITAL_BASED_OUTPATIENT_CLINIC_OR_DEPARTMENT_OTHER): Payer: Medicare HMO | Admitting: Nurse Practitioner

## 2015-08-25 ENCOUNTER — Encounter: Payer: Self-pay | Admitting: Family Medicine

## 2015-08-25 VITALS — BP 128/76 | HR 96 | Temp 98.1°F | Wt 186.0 lb

## 2015-08-25 VITALS — BP 140/71 | HR 99 | Temp 98.1°F | Resp 18 | Ht 62.0 in | Wt 186.0 lb

## 2015-08-25 DIAGNOSIS — C50912 Malignant neoplasm of unspecified site of left female breast: Secondary | ICD-10-CM

## 2015-08-25 DIAGNOSIS — Z23 Encounter for immunization: Secondary | ICD-10-CM | POA: Diagnosis not present

## 2015-08-25 DIAGNOSIS — D0512 Intraductal carcinoma in situ of left breast: Secondary | ICD-10-CM | POA: Diagnosis not present

## 2015-08-25 DIAGNOSIS — B370 Candidal stomatitis: Secondary | ICD-10-CM | POA: Diagnosis not present

## 2015-08-25 DIAGNOSIS — E785 Hyperlipidemia, unspecified: Secondary | ICD-10-CM | POA: Diagnosis not present

## 2015-08-25 MED ORDER — NYSTATIN 100000 UNIT/ML MT SUSP
5.0000 mL | Freq: Four times a day (QID) | OROMUCOSAL | Status: DC
Start: 2015-08-25 — End: 2015-09-22

## 2015-08-25 MED ORDER — TAMOXIFEN CITRATE 20 MG PO TABS: 20.0000 mg | ORAL_TABLET | Freq: Every day | ORAL | Status: DC

## 2015-08-25 MED ORDER — INFLUENZA VAC SPLIT QUAD 0.5 ML IM SUSY
0.5000 mL | PREFILLED_SYRINGE | Freq: Once | INTRAMUSCULAR | Status: AC
  Administered 2015-08-25: 0.5 mL via INTRAMUSCULAR
  Filled 2015-08-25: qty 0.5

## 2015-08-25 NOTE — Patient Instructions (Addendum)
Your diabetes test was fine at the hospital.  Use the nystatin swish and swallow in the meantime.  When you can, make the change to the generic crestor.  Update me as needed.  I would get a flu shot each fall.   Recheck your sugar in about 11/2015, labs ahead of time.  Take care.  Glad to see you.

## 2015-08-25 NOTE — Progress Notes (Signed)
Pre visit review using our clinic review tool, if applicable. No additional management support is needed unless otherwise documented below in the visit note.  She is about 3 weeks out from surgery.  She has follow up pending with surgery.  Pain control is fair, with her current meds.  Still with 1 drain present, a JP drain.  Less drainage now, usually pinkish. No FCNAVD.  Normal BMs.  Her mood is good.    She was given generic crestor and had concerns about that.  She hasn't made the change yet to the new rx.  She has been taking it intermittently, trying to "stretch it out."  D/w pt.    She has some thrush recently, likely related to being on abx.    Meds, vitals, and allergies reviewed.   ROS: See HPI.  Otherwise, noncontributory.  nad Minimal thrush noted in OP Neck supple no LA rrr ctab JP with minimal thin serosanguinous fluid noted.  Ext with trace edema.

## 2015-08-25 NOTE — Telephone Encounter (Signed)
Gave avs & calendar for January °

## 2015-08-26 ENCOUNTER — Encounter: Payer: Self-pay | Admitting: Nurse Practitioner

## 2015-08-26 ENCOUNTER — Other Ambulatory Visit: Payer: Self-pay | Admitting: *Deleted

## 2015-08-26 DIAGNOSIS — C50912 Malignant neoplasm of unspecified site of left female breast: Secondary | ICD-10-CM

## 2015-08-26 DIAGNOSIS — B370 Candidal stomatitis: Secondary | ICD-10-CM | POA: Insufficient documentation

## 2015-08-26 NOTE — Progress Notes (Signed)
[place]  [phone] [phone]     ID: Kelly Kennedy DOB: Feb 16, 1941  MR#: [id number]  RWE#:[id number]  Patient Care Team: [name], MD as PCP - General PCP: [name], MD GYN: SU: [name] MD OTHER MD:  CHIEF COMPLAINT: Ductal carcinoma in situ  CURRENT TREATMENT: tamoxifen to begin in Nov  BREAST CANCER HISTORY: "Kelly Kennedy" had routine screening mammography with tomography at the breast Center for 20 08/18/2015 showing possible new calcifications in the left breast. Left diagnostic mammography on 04/02/2015 showed pleomorphic calcifications measuring up to 8 mm in the upper outer quadrant. Biopsy of this area on 04/07/2015 showed ([id number]) ductal carcinoma in situ, low to intermediate grade, estrogen receptor 97% positive, progesterone receptor 97% positive, both with strong staining intensity.  A second group of calcifications, more anterior, also in the upper outer quadrant, was biopsied 04/22/2015 showing ([id number]) ductal carcinoma in situ, grade 2, estrogen receptor 100% positive, progesterone receptor 98% positive, both with strong staining intensity.  On 05/06/2015 the patient underwent left partial mastectomy which showed intermediate grade ductal carcinoma in situ extending over 7.9 cm. Both sentinel lymph nodes were clear. Margins were less than a millimeter anteriorly and posteriorly as well as laterally.  Given the many exceedingly close margins and the cosmetic defect already present, the patient has opted for left mastectomy with immediate reconstruction.   Her subsequent history is as detailed below.  INTERVAL HISTORY: Kelly Kennedy returns today for follow up of her breast cancer. She is status post a nipple sparing left mastectomy with implant reconstruction. She is healing well from this, but does occasionally use dilaudid for pain at night. She still has 1 drain left, which has picked up in the amount  of fluid it has been collecting, when previously it was down to almost nothing. She is here to discuss starting tamoxifen today.  REVIEW OF SYSTEMS: Kelly Kennedy denies fevers, chills, nausea, or vomiting. She uses magnesium to have regular bowel movements. She is eating and drinking well. She is being treated for thrush after her hospitalization with nystatin rinse. She denies shortness of breath, chest pain, cough, or palpitations. She has no headaches, dizziness, or vision changes. A detailed review of systems is otherwise stable.  PAST MEDICAL HISTORY: Past Medical History  Diagnosis Date  . Hyperlipidemia   . Diverticulosis of colon 06/2005  . Hypothyroid   . Insomnia   . Menopausal symptoms   . Hypertension   . Hyperglycemia   . adenomatous Colon polyps     colonoscopy 8/99, 12/02, 8/06  . Pericarditis 1980's  . Chest pain     uses NTG as needed  . Depression   . Bell's palsy   . GERD (gastroesophageal reflux disease)   . H/O hiatal hernia   . Headache(784.0)   . Arthritis   . Irritable bowel   . Diabetes mellitus without complication   . TIA (transient ischemic attack)     09/2014  . Ductal carcinoma in situ (DCIS) of left breast   . Cancer     DCIS left breast  . Family history of adverse reaction to anesthesia     " my mother didn't wake up so they had to put her on life support for about an hour or more; my sisiter has problems waking up too from anesthesia.    PAST SURGICAL HISTORY: Past Surgical History  Procedure Laterality Date  . Partial hysterectomy      ovaries intact  . Cholecystectomy  1990  . Ectopic pregnancy surgery  1971  . Cystectomy  05/04/08    left lower arm, benign  . Tonsillectomy    . Appendectomy    . Cardiac catheterization    . Fracture surgery      nose  . Tubal ligation    . Orif ankle fracture Left 10/23/2013    Procedure: OPEN REDUCTION INTERNAL FIXATION (ORIF) LEFT ANKLE FRACTURE;  Surgeon: Marianna Payment, MD;  Location: [facility];   Service: Orthopedics;  Laterality: Left;  . Cataract extraction Bilateral 2016  . Partial mastectomy with needle localization and axillary sentinel lymph node bx Left 05/06/2015    Procedure: LEFT BREAST PARTIAL MASTECTOMY WITH NEEDLE LOCALIZATION AND SENTINEL LYMPH NODE MAPPING;  Surgeon: [name], MD;  Location: [facility];  Service: General;  Laterality: Left;  . Nipple sparing mastectomy Left 08/06/2015    Procedure: LEFT NIPPLE SPARING MASTECTOMY;  Surgeon: [name], MD;  Location: [facility];  Service: General;  Laterality: Left;  . Breast reconstruction with placement of tissue expander and flex hd (acellular hydrated dermis) Left 08/06/2015    Procedure: LEFT BREAST RECONSTRUCTION WITH PLACEMENT OF SALINE IMPLANT AND ACELLULAR DURMAL MATRIX;  Surgeon: Crissie Reese, MD;  Location: [facility];  Service: Plastics;  Laterality: Left;    FAMILY HISTORY Family History  Problem Relation Age of Onset  . Cancer Father     stomach   . Heart disease Son     MI  . Arthritis Mother   . Diabetes Mother   . Colon cancer Neg Hx   . Pancreatic cancer Neg Hx   . Stomach cancer Neg Hx   . Breast cancer Neg Hx    the patient's father died from stomach cancer at age 75. The patient's mother died from gangrene at the age of 18. The patient has one sister, 9 brothers. Her sister had breast cancer diagnosed after the age of 68. There is no history of ovarian cancer in the family.  GYNECOLOGIC HISTORY:  No LMP recorded. Patient has had a hysterectomy. Menarche age 77, first live birth age 14, the patient is GX P3. She underwent hysterectomy more than 20 years ago. She does believe her ovaries were removed at that time. She was on hormone replacement until earlier this year.  SOCIAL HISTORY:  She used to work in a [employer] but is now retired. She is widowed. At home she lives with her sister [name] and her son [name], who works in Theatre manager. The patient's daughter [name] is currently  unemployed. The patient's youngest son died from heart problems at age 8. The patient has 4 grandchildren and at least 67 great-grandchildren. She attends a [employer] locally    ADVANCED DIRECTIVES: Not in place   HEALTH MAINTENANCE: Social History  Substance Use Topics  . Smoking status: Former Research scientist (life sciences)  . Smokeless tobacco: Former Systems developer  . Alcohol Use: 0.0 oz/week    0 Standard drinks or equivalent per week     Comment: social     Colonoscopy: 09/09/2010, 09/26/2013  PAP:  Bone density: 2013?/Normal per patient report  Lipid panel:  Allergies  Allergen Reactions  . Ace Inhibitors Other (See Comments)    H/o lip swelling with change in losartan pill  . Angiotensin Receptor Blockers Other (See Comments)    H/o lip swelling with change in losartan pill  . Niacin Rash  . Red Dye Rash    Current Outpatient Prescriptions  Medication Sig Dispense Refill  . amLODipine (  NORVASC) 5 MG tablet Take 1 tablet (5 mg total) by mouth daily. 90 tablet 3  . Aspirin-Salicylamide-Caffeine (BC HEADACHE POWDER PO) Take by mouth as needed.    . brimonidine-timolol (COMBIGAN) 0.2-0.5 % ophthalmic solution Place 1 drop into both eyes every 12 (twelve) hours.    Marland Kitchen doxycycline (MONODOX) 100 MG capsule Take 100 mg by mouth 2 (two) times daily.    . fluocinonide cream (LIDEX) 0.05 % APPLY TOPICALLY DAILY AS NEEDED 30 g 5  . furosemide (LASIX) 20 MG tablet TAKE 1 TABLET (20 MG TOTAL) BY MOUTH DAILY. 90 tablet 2  . glipiZIDE (GLUCOTROL) 2.5 mg TABS tablet Take 0.5 tablets (2.5 mg total) by mouth daily before breakfast. (Patient taking differently: Take 1.25 mg by mouth daily before breakfast. ) 60 tablet 2  . HYDROmorphone (DILAUDID) 2 MG tablet Take 2 mg by mouth every 4 (four) hours as needed for severe pain.    . Hypromellose (GENTEAL OP) Apply 1 application to eye at bedtime.    Marland Kitchen levothyroxine (SYNTHROID, LEVOTHROID) 100 MCG tablet Take one tablet by mouth daily except on Monday and Wednesday take  1/2 tablet (Patient taking differently: Take 50-100 mcg by mouth daily before breakfast. Take one tablet by mouth daily except on Monday and Wednesday take 1/2 tablet) 90 tablet 3  . magnesium oxide (MAG-OX) 400 (241.3 MG) MG tablet TAKE 1 TABLET (400 MG TOTAL) BY MOUTH DAILY. 30 tablet 11  . methocarbamol (ROBAXIN) 500 MG tablet Take 1 tablet (500 mg total) by mouth every 6 (six) hours as needed for muscle spasms. 40 tablet 0  . nystatin (MYCOSTATIN) 100000 UNIT/ML suspension Take 5 mLs (500,000 Units total) by mouth 4 (four) times daily. Swish and swallow. 60 mL 1  . omeprazole (PRILOSEC OTC) 20 MG tablet Take 20 mg by mouth daily.    Vladimir Faster Glycol-Propyl Glycol (SYSTANE OP) Apply 1 drop to eye at bedtime.    . rosuvastatin (CRESTOR) 10 MG tablet Take 1 tablet (10 mg total) by mouth daily. NEED TO SCHEDULE FOLLOW UP OFFICE VISIT 90 tablet 1  . nitroGLYCERIN (NITROSTAT) 0.4 MG SL tablet Place 1 tablet (0.4 mg total) under the tongue every 5 (five) minutes as needed for chest pain. (Patient not taking: Reported on 08/25/2015) 25 tablet 12  . tamoxifen (NOLVADEX) 20 MG tablet Take 1 tablet (20 mg total) by mouth daily. 30 tablet 6   No current facility-administered medications for this visit.    OBJECTIVE: Middle-aged African-American woman who appears stated age 74 Vitals:   08/25/15 1432  BP: 140/71  Pulse: 99  Temp: 98.1 F (36.7 C)  Resp: 18     Body mass index is 34.01 kg/(m^2).    ECOG FS:1 - Symptomatic but completely ambulatory  Skin: warm, dry  HEENT: sclerae anicteric, conjunctivae pink, oropharynx clear. No thrush or mucositis.  Lymph Nodes: No cervical or supraclavicular lymphadenopathy  Lungs: clear to auscultation bilaterally, no rales, wheezes, or rhonci  Heart: regular rate and rhythm  Abdomen: round, soft, non tender, positive bowel sounds  Musculoskeletal: No focal spinal tenderness, no peripheral edema  Neuro: non focal, well oriented, positive affect  Breasts:  left breast status post nipple sparing mastectomy with implant reconstruction. Drain still in place with serosanguinous fluid. Right breast unremarkable.   LAB RESULTS:  CMP     Component Value Date/Time   NA 135 08/08/2015 0344   NA 141 06/10/2015 1549   K 3.4* 08/08/2015 0344   K 3.8 06/10/2015 1549  CL 101 08/08/2015 0344   CO2 26 08/08/2015 0344   CO2 26 06/10/2015 1549   GLUCOSE 127* 08/08/2015 0344   GLUCOSE 111 06/10/2015 1549   GLUCOSE 113* 12/11/2006 1253   BUN 6 08/08/2015 0344   BUN 16.6 06/10/2015 1549   CREATININE 0.77 08/08/2015 0344   CREATININE 1.3* 06/10/2015 1549   CREATININE 0.79 10/08/2012 1750   CALCIUM 8.9 08/08/2015 0344   CALCIUM 9.3 06/10/2015 1549   PROT 7.0 06/10/2015 1549   PROT 6.7 05/04/2015 1600   ALBUMIN 3.7 06/10/2015 1549   ALBUMIN 3.8 05/04/2015 1600   AST 30 06/10/2015 1549   AST 34 05/04/2015 1600   ALT 31 06/10/2015 1549   ALT 33 05/04/2015 1600   ALKPHOS 66 06/10/2015 1549   ALKPHOS 58 05/04/2015 1600   BILITOT 0.30 06/10/2015 1549   BILITOT 0.4 05/04/2015 1600   GFRNONAA >60 08/08/2015 0344   GFRAA >60 08/08/2015 0344    INo results found for: SPEP, UPEP  Lab Results  Component Value Date   WBC 10.7* 08/08/2015   NEUTROABS 3.3 06/10/2015   HGB 11.1* 08/08/2015   HCT 33.1* 08/08/2015   MCV 88.0 08/08/2015   PLT 297 08/08/2015      Chemistry      Component Value Date/Time   NA 135 08/08/2015 0344   NA 141 06/10/2015 1549   K 3.4* 08/08/2015 0344   K 3.8 06/10/2015 1549   CL 101 08/08/2015 0344   CO2 26 08/08/2015 0344   CO2 26 06/10/2015 1549   BUN 6 08/08/2015 0344   BUN 16.6 06/10/2015 1549   CREATININE 0.77 08/08/2015 0344   CREATININE 1.3* 06/10/2015 1549   CREATININE 0.79 10/08/2012 1750      Component Value Date/Time   CALCIUM 8.9 08/08/2015 0344   CALCIUM 9.3 06/10/2015 1549   ALKPHOS 66 06/10/2015 1549   ALKPHOS 58 05/04/2015 1600   AST 30 06/10/2015 1549   AST 34 05/04/2015 1600   ALT 31  06/10/2015 1549   ALT 33 05/04/2015 1600   BILITOT 0.30 06/10/2015 1549   BILITOT 0.4 05/04/2015 1600       No results found for: LABCA2  No components found for: LABCA125  No results for input(s): INR in the last 168 hours.  Urinalysis    Component Value Date/Time   COLORURINE YELLOW 09/29/2014 0645   APPEARANCEUR CLEAR 09/29/2014 0645   LABSPEC 1.009 09/29/2014 0645   PHURINE 6.5 09/29/2014 0645   GLUCOSEU NEGATIVE 09/29/2014 0645   HGBUR NEGATIVE 09/29/2014 0645   HGBUR negative 07/16/2010 1521   BILIRUBINUR NEGATIVE 09/29/2014 0645   KETONESUR NEGATIVE 09/29/2014 0645   PROTEINUR NEGATIVE 09/29/2014 0645   UROBILINOGEN 0.2 09/29/2014 0645   NITRITE NEGATIVE 09/29/2014 0645   LEUKOCYTESUR NEGATIVE 09/29/2014 0645    STUDIES: No results found.  ASSESSMENT: 74 y.o. Export woman status post left lumpectomy and sentinel lymph node sampling 05/06/2015 for extensive ductal carcinoma in situ, grade 2, estrogen and progesterone receptor strongly positive, with both sentinel lymph nodes negative, but exceedingly close margins  (1) left nipple sparing mastectomy with immediate implant reconstruction on 08/06/2015  (2) tamoxifen for breast cancer prevention to start once the patient fully recovers from her surgery   PLAN: Kelly Kennedy is doing well today. She is recovering from surgery well. She still has 1 drain left in however. At this time, she is still interested in tamoxifen for a course of antiestrogen therapy, though she understands that she is likely (99%) to have been cured  of her noninvasive breast cancer. We reviewed the risks and benefits of this drug. She understands that she may experience hot flashes and vaginal wetness. There is there risk of blood clots, but she previously too estrogen replacement therapy and had no incidences of this. There is also the risk of uterine polyps/cancer, but she had a hysterectomy 20 years ago.   She is to wait until she has the  drains removed, and fully recovered from her surgery before beginning this drug. We have named Nov 1 a potential start date. She will return in January for follow up and to ensure that she is tolerating it well. She understands and agrees with this plan. She knows the goal of treatment in her case is prevention. She has been encouraged to call with any issues that might arise before her next visit here.    Laurie Panda, NP   08/26/2015 8:38 AM

## 2015-08-26 NOTE — Assessment & Plan Note (Signed)
Start nystatin and f/u prn.  She agrees.  Should resolve.

## 2015-08-26 NOTE — Assessment & Plan Note (Signed)
She wanted to wait on the med change from brand to generic.  I told her it was okay to proceed now, but I did see her point.  She wants the fewest changes possible now given everything that has gone on.  She'll get through with the abx and surgery f/u, then can make the statin substitution.  We can recheck labs later on.  She agrees. Update me as needed.

## 2015-08-26 NOTE — Assessment & Plan Note (Signed)
Per surgery.  App help caring for patient.

## 2015-08-27 ENCOUNTER — Other Ambulatory Visit: Payer: Self-pay | Admitting: Family Medicine

## 2015-09-12 ENCOUNTER — Other Ambulatory Visit: Payer: Self-pay | Admitting: Family Medicine

## 2015-09-14 NOTE — Telephone Encounter (Signed)
Received refill electronically Last refill 05/14/15 #30 Last office visit 08/25/15/acute Is it okay to refill?

## 2015-09-15 NOTE — Telephone Encounter (Signed)
Please call in.  Thanks.   

## 2015-09-15 NOTE — Telephone Encounter (Signed)
rx called into pharmacy

## 2015-09-17 ENCOUNTER — Encounter: Payer: Self-pay | Admitting: Nurse Practitioner

## 2015-09-18 ENCOUNTER — Telehealth: Payer: Self-pay | Admitting: *Deleted

## 2015-09-18 ENCOUNTER — Telehealth: Payer: Self-pay | Admitting: Oncology

## 2015-09-18 NOTE — Telephone Encounter (Signed)
LM for pt to rtn call to reschedule survivorship appt missed on 10/20 or if she would like care plan mailed to her.

## 2015-09-18 NOTE — Telephone Encounter (Signed)
pt called to r/s missed appt....pt ok and aware of new d.t °

## 2015-09-22 ENCOUNTER — Encounter: Payer: Self-pay | Admitting: Nurse Practitioner

## 2015-09-22 ENCOUNTER — Ambulatory Visit (HOSPITAL_BASED_OUTPATIENT_CLINIC_OR_DEPARTMENT_OTHER): Payer: Commercial Managed Care - HMO | Admitting: Nurse Practitioner

## 2015-09-22 VITALS — BP 168/84 | HR 100 | Temp 98.4°F | Resp 18 | Ht 62.0 in | Wt 188.3 lb

## 2015-09-22 DIAGNOSIS — D0512 Intraductal carcinoma in situ of left breast: Secondary | ICD-10-CM

## 2015-09-22 DIAGNOSIS — Z170 Estrogen receptor positive status [ER+]: Secondary | ICD-10-CM | POA: Diagnosis not present

## 2015-09-22 DIAGNOSIS — C50912 Malignant neoplasm of unspecified site of left female breast: Secondary | ICD-10-CM

## 2015-09-22 NOTE — Progress Notes (Signed)
CLINIC:  Cancer Survivorship   REASON FOR VISIT:  Routine follow-up post-treatment for a recent history of breast cancer.  BRIEF ONCOLOGIC HISTORY:    Breast cancer, left breast ([place])   03/27/2015 Mammogram Left breast: new calcifications   04/02/2015 Breast US Left breast: segmental calcifications in UOQ. These appear slightly pleomorphic along the posterior margin with a focal group group measuring 8 mm.    04/07/2015 Initial Biopsy Left breast needle core bx: DCIS with calcifications and necrosis, low to intermediate grade, ER+ (97%), PR+ (97%)   04/22/2015 Clinical Stage Stage 0: Tis N0   04/22/2015 Procedure Left breast needle core bx: DCIS with calcifications and necrosis, intermediate frade, ER+ (100%), PR+ (98%)   05/06/2015 Surgery Left lumpectomy/SLNB ([facility]): DCIS, intermediate grade with associated calcifications, spanning 7.9 cm, close to margins, 2 LN removed and negative for malignancy   05/06/2015 Pathologic Stage Stage 0: pTis pN0   08/06/2015 Definitive Surgery Left nipple sparing mastectomy with immediate reconstruction ([facility] / Harlow Mares): DCIS, 0.4 cm, clear margins. Bx of left nipple negative for malignancy.   09/15/2015 -  Anti-estrogen oral therapy Tamoxifen 20 mg daily.  Planned duration of therapy 5 years.    INTERVAL HISTORY:  Kelly Kennedy presents to the [facility] today for our initial meeting to review her survivorship care plan detailing her treatment course for breast cancer, as well as monitoring long-term side effects of that treatment, education regarding health maintenance, screening, and overall wellness and health promotion.     Overall, Kelly Kennedy reports feeling quite well since undergoing her definitive surgery approximately 1.5 months ago.  She is in the process of reconstruction under the direction of Dr. Harlow Mares, and states that this is planned for sometime in early 2017.  She denies any change in her right breast or along her right implant.  She  denies any cough, headache, change in appetite or change in weight.  She does continue with fatigue but feels as if it is a bit better.  She began her tamoxifen in mid October and has had hot flashes, which are bothersome and do impair her sleep, but she feels as if they are bearable.  She has occasional pain in her left chest wall which she describes as shooting and is self limited.  She has some peripheral neuropathy that predates her cancer diagnosis.  REVIEW OF SYSTEMS:  General: Hot flashes and fatigue as above. Denies fever, chills, or night sweats.  Cardiac: Denies palpitations, chest pain, and lower extremity edema.  Respiratory: Denies shortness of breath and dyspnea on exertion.  GI: Denies abdominal pain, constipation, diarrhea, nausea, or vomiting.  GU: Denies dysuria, hematuria, vaginal bleeding, vaginal discharge, or vaginal dryness.  Musculoskeletal: Denies joint or bone pain.  Neuro: Denies recent falls or peripheral neuropathy. Skin: Denies rash, pruritis, or open wounds.  Breast: Denies any new nodularity, masses, tenderness, nipple changes, or nipple discharge in right breast.  No changes along right sided implant.  Psych: Denies depression, anxiety, or memory loss.   A 14-point review of systems was completed and was negative, except as noted above.   ONCOLOGY TREATMENT TEAM:  1. Surgeon:  Dr. Brantley Stage at [facility]  2. Medical Oncologist: Dr. Jana Hakim 3. Plastic Surgeon: Dr. Harlow Mares    PAST MEDICAL/SURGICAL HISTORY:  Past Medical History  Diagnosis Date  . Hyperlipidemia   . Diverticulosis of colon 06/2005  . Hypothyroid   . Insomnia   . Menopausal symptoms   . Hypertension   . Hyperglycemia   .  adenomatous Colon polyps     colonoscopy 8/99, 12/02, 8/06  . Pericarditis 1980's  . Chest pain     uses NTG as needed  . Depression   . Bell's palsy   . GERD (gastroesophageal reflux disease)   . H/O hiatal hernia   . Headache(784.0)   . Arthritis     . Irritable bowel   . Diabetes mellitus without complication ([place])   . TIA (transient ischemic attack)     09/2014  . Ductal carcinoma in situ (DCIS) of left breast   . Cancer ([facility])     DCIS left breast  . Family history of adverse reaction to anesthesia     " my mother didn't wake up so they had to put her on life support for about an hour or more; my sisiter has problems waking up too from anesthesia.   Past Surgical History  Procedure Laterality Date  . Partial hysterectomy      ovaries intact  . Cholecystectomy  1990  . Ectopic pregnancy surgery  1971  . Cystectomy  05/04/08    left lower arm, benign  . Tonsillectomy    . Appendectomy    . Cardiac catheterization    . Fracture surgery      nose  . Tubal ligation    . Orif ankle fracture Left 10/23/2013    Procedure: OPEN REDUCTION INTERNAL FIXATION (ORIF) LEFT ANKLE FRACTURE;  Surgeon: Marianna Payment, MD;  Location: [facility];  Service: Orthopedics;  Laterality: Left;  . Cataract extraction Bilateral 2016  . Partial mastectomy with needle localization and axillary sentinel lymph node bx Left 05/06/2015    Procedure: LEFT BREAST PARTIAL MASTECTOMY WITH NEEDLE LOCALIZATION AND SENTINEL LYMPH NODE MAPPING;  Surgeon: [name], MD;  Location: [facility];  Service: General;  Laterality: Left;  . Nipple sparing mastectomy Left 08/06/2015    Procedure: LEFT NIPPLE SPARING MASTECTOMY;  Surgeon: [name], MD;  Location: [facility];  Service: General;  Laterality: Left;  . Breast reconstruction with placement of tissue expander and flex hd (acellular hydrated dermis) Left 08/06/2015    Procedure: LEFT BREAST RECONSTRUCTION WITH PLACEMENT OF SALINE IMPLANT AND ACELLULAR DURMAL MATRIX;  Surgeon: Crissie Reese, MD;  Location: [facility];  Service: Plastics;  Laterality: Left;     ALLERGIES:  Allergies  Allergen Reactions  . Ace Inhibitors Other (See Comments)    H/o lip swelling with change in losartan pill  . Angiotensin  Receptor Blockers Other (See Comments)    H/o lip swelling with change in losartan pill  . Niacin Rash  . Red Dye Rash     CURRENT MEDICATIONS:  Current Outpatient Prescriptions on File Prior to Visit  Medication Sig Dispense Refill  . amLODipine (NORVASC) 5 MG tablet Take 1 tablet (5 mg total) by mouth daily. 90 tablet 3  . fluocinonide cream (LIDEX) 0.05 % APPLY TOPICALLY DAILY AS NEEDED 30 g 5  . furosemide (LASIX) 20 MG tablet TAKE 1 TABLET (20 MG TOTAL) BY MOUTH DAILY. 90 tablet 2  . glipiZIDE (GLUCOTROL) 2.5 mg TABS tablet Take 0.5 tablets (2.5 mg total) by mouth daily before breakfast. (Patient taking differently: Take 1.25 mg by mouth daily before breakfast. ) 60 tablet 2  . Hypromellose (GENTEAL OP) Apply 1 application to eye at bedtime.    Marland Kitchen levothyroxine (SYNTHROID, LEVOTHROID) 100 MCG tablet TAKE ONE TABLET BY MOUTH DAILY EXCEPT ON MONDAY AND WEDNESDAY TAKE 1/2 TABLET 90 tablet 3  . magnesium oxide (MAG-OX) 400 (  241.3 MG) MG tablet TAKE 1 TABLET (400 MG TOTAL) BY MOUTH DAILY. 30 tablet 11  . omeprazole (PRILOSEC OTC) 20 MG tablet Take 20 mg by mouth daily.    Vladimir Faster Glycol-Propyl Glycol (SYSTANE OP) Apply 1 drop to eye at bedtime.    . rosuvastatin (CRESTOR) 10 MG tablet Take 1 tablet (10 mg total) by mouth daily. NEED TO SCHEDULE FOLLOW UP OFFICE VISIT 90 tablet 1  . tamoxifen (NOLVADEX) 20 MG tablet Take 1 tablet (20 mg total) by mouth daily. 30 tablet 6  . Aspirin-Salicylamide-Caffeine (BC HEADACHE POWDER PO) Take by mouth as needed.     No current facility-administered medications on file prior to visit.     ONCOLOGIC FAMILY HISTORY:  Family History  Problem Relation Age of Onset  . Cancer Father     stomach   . Heart disease Son     MI  . Arthritis Mother   . Diabetes Mother   . Colon cancer Neg Hx   . Pancreatic cancer Neg Hx   . Stomach cancer Neg Hx   . Breast cancer Neg Hx      GENETIC COUNSELING/TESTING: None  SOCIAL HISTORY:  Rosia L  Dyar is widowed and lives with her sister in [place], [place].  She has 3 children.  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  She is a former smoker and denies any past or current illicit drug use. She uses alcohol socially.    PHYSICAL EXAMINATION:  Vital Signs:   Filed Vitals:   09/22/15 1013  BP: 168/84  Pulse: 100  Temp: 98.4 F (36.9 C)  Resp: 18   ECOG Performance status: 0 General: Well-nourished, well-appearing female in no acute distress.  She is unaccompanied  in clinic today.   HEENT: Head is atraumatic and normocephalic.  Pupils equal and reactive to light and accomodation. Conjunctivae clear without exudate.  Sclerae anicteric. Oral mucosa is pink, moist, and intact without lesions.  Oropharynx is pink without lesions or erythema.  Lymph: No cervical, supraclavicular, infraclavicular, or axillary lymphadenopathy noted on palpation.  Cardiovascular: Regular rate and rhythm without murmurs, rubs, or gallops. Respiratory: Clear to auscultation bilaterally. Chest expansion symmetric without accessory muscle use on inspiration or expiration.  GI: Abdomen soft and round. No tenderness to palpation. Bowel sounds normoactive in 4 quadrants..   GU: Deferred.  Musculoskeletal: Muscle strength 5/5 in all extremities.  Neuro: No focal deficits. Steady gait.  Psych: Mood and affect normal and appropriate for situation.  Extremities: No edema, cyanosis, or clubbing.  Skin: Warm and dry. No open lesions noted.   LABORATORY DATA:  None for this visit.  DIAGNOSTIC IMAGING:  None for this visit.     ASSESSMENT AND PLAN:   1. History of breast cancer: Stage 0 ductal carcinoma in situ of the left breast, ER/PR+, S/P lumpectomy followed by mastectomy to clear margins, in process of reconstruction with saline implant, now on adjuvant tamoxifen. Ms. Minassian is doing well without clinical symptoms worrisome for disease recurrence. She will follow-up with her medical  oncologist,  Dr. Jana Hakim, in January 2017 with history and physical exam per surveillance protocol.  She will continue her anti-estrogen therapy with tamoxifen as prescribed by  Dr. Jana Hakim. She was instructed to make Dr. Jana Hakim or myself aware if she begins to experience any worsening side effects of the medication and I could see her back in clinic to help manage those side effects, as needed. She is S/P hysterectomy. Side effects  of Tamoxifen were again reviewed with her as well. A comprehensive survivorship care plan and treatment summary was reviewed with the patient today detailing her breast cancer diagnosis, treatment course, potential late/long-term effects of treatment, appropriate follow-up care with recommendations for the future, and patient education resources.  A copy of this summary, along with a letter will be sent to the patient's primary care provider via in basket message after today's visit.  Ms. Kitzmiller is welcome to return to the Survivorship Clinic in the future, as needed; no follow-up will be scheduled at this time.    2. Cancer screening:  Due to Ms. Fossett's history and her age, she should receive screening for skin cancers and colon cancer.  The information and recommendations are listed on the patient's comprehensive care plan/treatment summary and were reviewed in detail with the patient.    3. Health maintenance and wellness promotion: Ms. Moor was encouraged to consume 5-7 servings of fruits and vegetables per day. We reviewed the handout about "Nutrition for Breast Cancer Survivors."  She was also encouraged to engage in moderate to vigorous exercise for 30 minutes per day most days of the week. We discussed the [employer] fitness program, which is designed for cancer survivors to help them become more physically fit after cancer treatments.  She was instructed to limit her alcohol consumption and continue to abstain from tobacco use.   4. Support  services/counseling: It is not uncommon for this period of the patient's cancer care trajectory to be one of many emotions and stressors.  We discussed an opportunity for her to participate in the next session of [facility] ("Finding Your New Normal") support group series designed for patients after they have completed treatment.   Ms. Wojtas was encouraged to take advantage of our many other support services programs, support groups, and/or counseling in coping with her new life as a cancer survivor after completing anti-cancer treatment.  She was offered support today through active listening and expressive supportive counseling.  She was given information regarding our available services and encouraged to contact me with any questions or for help enrolling in any of our support group/programs.    A total of 50 minutes of face-to-face time was spent with this patient with greater than 50% of that time in counseling and care-coordination.   Kelly Cheese, Kelly Kennedy  Survivorship Program [place] [phone]   Note: PRIMARY CARE PROVIDER Kelly Kennedy, [place] [phone]

## 2015-09-29 ENCOUNTER — Ambulatory Visit (INDEPENDENT_AMBULATORY_CARE_PROVIDER_SITE_OTHER): Payer: Commercial Managed Care - HMO | Admitting: Neurology

## 2015-09-29 ENCOUNTER — Encounter: Payer: Self-pay | Admitting: Neurology

## 2015-09-29 VITALS — BP 103/64 | HR 81 | Ht 63.0 in | Wt 186.8 lb

## 2015-09-29 DIAGNOSIS — E1159 Type 2 diabetes mellitus with other circulatory complications: Secondary | ICD-10-CM

## 2015-09-29 DIAGNOSIS — R200 Anesthesia of skin: Secondary | ICD-10-CM

## 2015-09-29 DIAGNOSIS — I10 Essential (primary) hypertension: Secondary | ICD-10-CM

## 2015-09-29 DIAGNOSIS — E785 Hyperlipidemia, unspecified: Secondary | ICD-10-CM

## 2015-09-29 NOTE — Patient Instructions (Signed)
-   continue ASA for stroke prevention - discuss with Dr. Damita Dunnings to check lipid panel and decide on statin medications - check BP at home - regular exercise  - repeat EEG and compare with previous one - Follow up with your primary care physician for stroke risk factor modification. Recommend maintain blood pressure goal <130/80, diabetes with hemoglobin A1c goal below 6.5% and lipids with LDL cholesterol goal below 70 mg/dL.   - follow up in 6 months.

## 2015-09-29 NOTE — Progress Notes (Signed)
NEUROLOGY CLINIC FOLLOW UP NOTE  NAME: Kelly Kennedy DOB: 06/14/1941  HPI: Kelly Kennedy is a 74 y.o. female with PMH of HTN, DM, HLD who presents as a new patient for recurrent left sided weakness and numbness.   Pt was admitted to [facility] on 09/28/14 for episodic exertional chest pain and palpitations. She also complained of headache and episode of left UE and LE numbness and weakness. Her ECG is abnormal and there are subtle dynamic changes since yesterday - more obvious ST depression and T wave inversion inferolaterally. Borderline long QT. After admission, she had nuclear stress test on 09/29/2014 that was completely normal. EF was 62%. Cardiology suspect afib and recommended 30 day monitoring which done as outpt did not see any episodes of afib. Her outpt 2D echo showed EF 50-55%. A1C 6.9 and LDL 42.   She also underwent TIA work up. MRI and MRA negative. CUS Right: 1-39% ICA stenosis. Vertebral artery flow is antegrade. Left: 40-59% ICA stenosis. She has estradil patch for hot flashes. She was put on ASA and crestor on discharge.   She followed up with Dr. Sallyanne Kuster and concerning for TIA vs. Seizure due to recurrent left UE and LE numbness and weakness. By further questioning, she stated that she had both LE weakness since 09/2013 when she had fall with LE fracture. However, on 09/28/14, she had bad headache along with chest pain/palpitations as well as left UE and LE weakness and numbness, lasted about 3 hours and resolved. This was the first time she has it. Since then, she has possible 3 times a week left sided numbness and weakness but not as bad as the first one, lasting 10-52min and gone. This episodes seems not associated with HA temporally.   However, she does have HA for the last 2 years after she lost her husband. Initially the headache almost everyday, bilateral behind eyes, she contributes to sinus HA, 8/10, lasting all day, mild photophobia but no N/V, BC powder and sleeping  helps. Later the HA goes twice a week and triggers are stress and depression. She did have bad headache prior admission on 09/28/14 along with left UE and LE numbness weakness, but other times the HA and hemiparesis/hemiparesthesia are basically separated from each other.  She has hx of HTN on lasix, cozaar and her BP today 139/81. She denies DM but her A1C was 6.9 and she is on glipizide and she did not check her glucose at home.   She is using estradil patches for hot flashes. She said she needs it for hot flashes.  Her son had sudden death at age 33 - autopsy -"massive heart attack". No other family history of premature cardiac or vascular illness. She denies smoking, drinking or illicit drugs.   03/18/15 follow up - pt has been doing better. She stated that her left side numbness episode now only 3-4 per month, down significantly from 3-4 per week before. Her previous headache also resolved but she had some daily mild headache due to diclofenac eye drop, but gradually better now. She had EEG done here in 11/2014 showed left frontotemporal area, which is not correlating to her symptoms. Will need EEG repeat. Her BP today is high 170/94 in clinic, she does not check BP or glucose at home.  Interval history: During the interval time, she was found to have left breast cancer DCIS in 03/2015, had left lumpectomy in 04/2015 and mastectomy in 07/2015. Currently on tamoxifen for 5 years. Her HA is  much better, very infrequent. She still has intermittent left arm numbness but seems to related to left arm movement after left breast surgery. She contributes it to the surgery. She also has left hand and foot tingling sensation intermittently, lasting 84min, up to 2 per week. She does have DM, but glucose in good control and A1C two months ago was 6.9. She denies right hand or foot tingling though. Her BP 103/64 today in clinic, denies dizziness.    Past Medical History  Diagnosis Date  . Hyperlipidemia   .  Diverticulosis of colon 06/2005  . Hypothyroid   . Insomnia   . Menopausal symptoms   . Hypertension   . Hyperglycemia   . adenomatous Colon polyps     colonoscopy 8/99, 12/02, 8/06  . Pericarditis 1980's  . Chest pain     uses NTG as needed  . Depression   . Bell's palsy   . GERD (gastroesophageal reflux disease)   . H/O hiatal hernia   . Headache(784.0)   . Arthritis   . Irritable bowel   . Diabetes mellitus without complication ([place])   . TIA (transient ischemic attack)     09/2014  . Ductal carcinoma in situ (DCIS) of left breast   . Cancer (HCC)     DCIS left breast  . Family history of adverse reaction to anesthesia     " my mother didn't wake up so they had to put her on life support for about an hour or more; my sisiter has problems waking up too from anesthesia.  . Stroke [facility])    Past Surgical History  Procedure Laterality Date  . Partial hysterectomy      ovaries intact  . Cholecystectomy  1990  . Ectopic pregnancy surgery  1971  . Cystectomy  05/04/08    left lower arm, benign  . Tonsillectomy    . Appendectomy    . Cardiac catheterization    . Fracture surgery      nose  . Tubal ligation    . Orif ankle fracture Left 10/23/2013    Procedure: OPEN REDUCTION INTERNAL FIXATION (ORIF) LEFT ANKLE FRACTURE;  Surgeon: Marianna Payment, MD;  Location: [place];  Service: Orthopedics;  Laterality: Left;  . Cataract extraction Bilateral 2016  . Partial mastectomy with needle localization and axillary sentinel lymph node bx Left 05/06/2015    Procedure: LEFT BREAST PARTIAL MASTECTOMY WITH NEEDLE LOCALIZATION AND SENTINEL LYMPH NODE MAPPING;  Surgeon: [name], MD;  Location: [place];  Service: General;  Laterality: Left;  . Nipple sparing mastectomy Left 08/06/2015    Procedure: LEFT NIPPLE SPARING MASTECTOMY;  Surgeon: [name], MD;  Location: [place];  Service: General;  Laterality: Left;  . Breast reconstruction with placement of tissue  expander and flex hd (acellular hydrated dermis) Left 08/06/2015    Procedure: LEFT BREAST RECONSTRUCTION WITH PLACEMENT OF SALINE IMPLANT AND ACELLULAR DURMAL MATRIX;  Surgeon: Crissie Reese, MD;  Location: [facility];  Service: Plastics;  Laterality: Left;   Family History  Problem Relation Age of Onset  . Cancer Father     stomach   . Heart disease Son     MI  . Arthritis Mother   . Diabetes Mother   . Colon cancer Neg Hx   . Pancreatic cancer Neg Hx   . Stomach cancer Neg Hx   . Breast cancer Neg Hx    Current Outpatient Prescriptions  Medication Sig Dispense Refill  . amLODipine (NORVASC) 5 MG  tablet Take 1 tablet (5 mg total) by mouth daily. 90 tablet 3  . Aspirin-Salicylamide-Caffeine (BC HEADACHE POWDER PO) Take by mouth as needed.    . COMBIGAN 0.2-0.5 % ophthalmic solution PLACE 1 DROP INTO BOTH EYES TWICE A DAY  2  . fluocinonide cream (LIDEX) 0.05 % APPLY TOPICALLY DAILY AS NEEDED 30 g 5  . furosemide (LASIX) 20 MG tablet TAKE 1 TABLET (20 MG TOTAL) BY MOUTH DAILY. 90 tablet 2  . glipiZIDE (GLUCOTROL) 2.5 mg TABS tablet Take 0.5 tablets (2.5 mg total) by mouth daily before breakfast. (Patient taking differently: Take 1.25 mg by mouth daily before breakfast. ) 60 tablet 2  . Hypromellose (GENTEAL OP) Apply 1 application to eye at bedtime.    Marland Kitchen levothyroxine (SYNTHROID, LEVOTHROID) 100 MCG tablet TAKE ONE TABLET BY MOUTH DAILY EXCEPT ON MONDAY AND WEDNESDAY TAKE 1/2 TABLET 90 tablet 3  . magnesium oxide (MAG-OX) 400 (241.3 MG) MG tablet TAKE 1 TABLET (400 MG TOTAL) BY MOUTH DAILY. 30 tablet 11  . nitroGLYCERIN (NITROSTAT) 0.4 MG SL tablet     . nystatin (MYCOSTATIN) 100000 UNIT/ML suspension TAKE 5 MLS (500,000 UNITS TOTAL) BY MOUTH 4 (FOUR) TIMES DAILY. SWISH AND SWALLOW.  1  . omeprazole (PRILOSEC OTC) 20 MG tablet Take 20 mg by mouth daily.    Vladimir Faster Glycol-Propyl Glycol (SYSTANE OP) Apply 1 drop to eye at bedtime.    . tamoxifen (NOLVADEX) 20 MG tablet Take 1 tablet (20 mg  total) by mouth daily. 30 tablet 6  . traMADol (ULTRAM) 50 MG tablet     . VOLTAREN 1 % GEL      No current facility-administered medications for this visit.   Allergies  Allergen Reactions  . Ace Inhibitors Other (See Comments)    H/o lip swelling with change in losartan pill  . Angiotensin Receptor Blockers Other (See Comments)    H/o lip swelling with change in losartan pill  . Niacin Rash  . Red Dye Rash   Social History   Social History  . Marital Status: Widowed    Spouse Name: [name]  . Number of Children: 3  . Years of Education: [name]   Occupational History  . retired, previous Engineer, manufacturing systems    Social History Main Topics  . Smoking status: Former Research scientist (life sciences)  . Smokeless tobacco: Former Systems developer  . Alcohol Use: 0.0 oz/week    0 Standard drinks or equivalent per week     Comment: social  . Drug Use: No  . Sexual Activity: No   Other Topics Concern  . Not on file   Social History Narrative   From [place].   3 children, one died from MI in 03/25/05   Married 26-Mar-1959   Husband and brother died in the same week in November 24, 2012         Patient is right handed.   Patient has hs education.   Patient drinks 1 cup of soda daily, and green tea.    Review of Systems Full 14 system review of systems performed and notable only for those listed, all others are neg:  Constitutional:   Cardiovascular:  Leg swelling Ear/Nose/Throat: ringing in ears  Skin: moles  Eyes: blurry vision, eye itching, eye redness  Respiratory: [name]  Gastroitestinal: diarrhea  Hematology/Lymphatic: bruise  Endocrine: excessive eating  Musculoskeletal: back pain  Allergy/Immunology: [name]  Neurological: memory loss, HA, numbness, speech difficulty  Psychiatric: depression   Physical Exam  Filed Vitals:   09/29/15 0814  BP: 103/64  Pulse: 81    General - Well nourished, well developed, in no apparent distress.  Ophthalmologic - Sharp disc margins OU.  Cardiovascular - Regular rate and rhythm  with no murmur. Carotid pulses were 2+ without bruits .   Neck - supple, no nuchal rigidity .  Mental Status -  Level of arousal and orientation to time, place, and person were intact. Language including expression, naming, repetition, comprehension was assessed and found intact. Fund of Knowledge was assessed and was intact.  Cranial Nerves II - XII - II - Visual field intact OU. III, IV, VI - Extraocular movements intact. V - Facial sensation intact bilaterally. VII - Facial movement intact bilaterally. VIII - Hearing & vestibular intact bilaterally. X - Palate elevates symmetrically. XI - Chin turning & shoulder shrug intact bilaterally. XII - Tongue protrusion intact.  Motor Strength - The patient's strength was normal in all extremities and pronator drift was absent.  Bulk was normal and fasciculations were absent.   Motor Tone - Muscle tone was assessed at the neck and appendages and was normal.  Reflexes - The patient's reflexes were normal in all extremities and she had no pathological reflexes.  Sensory - Light touch, temperature/pinprick and Romberg testing were assessed and were normal.    Coordination - The patient had normal movements in the hands and feet with no ataxia or dysmetria.  Tremor was absent.  Gait and Station - The patient's transfers, posture, gait, station, and turns were observed as normal.   Imaging MRI and MRA head  1. No acute intracranial abnormality. 2. Mild chronic small vessel ischemic disease. 3. No evidence of major intracranial arterial occlusion or significant proximal stenosis.  2D echo - Left ventricle: The cavity size was normal. Wall thickness was normal. Systolic function was normal. The estimated ejection fraction was in the range of 50% to 55%. Wall motion was normal; there were no regional wall motion abnormalities. Doppler parameters are consistent with abnormal left ventricular relaxation (grade 1 diastolic  dysfunction).  CUS - 1-39% right ICA and 40-59% left ICA stenosis. Moderate right ECA stemnosis. Both vertebral artery flow is antegrade.  Stress test 09/29/14-  Pre-test: no chest pain or SOB With GXT: target HR reached in stage 1, patient hypertensive so CL injected and stage held. Pt continued to exercise for 8 minutes more. No chest pain with exertion. + SOB ECG: LVH changes at baseline, inferolateral ST changes deepened with exertion but no ST elevation seen SBP elevated with exertion to 194/87, so pt not pushed to increase exertion. Final report and images pending.   Treadmill Myoview IMPRESSION: 1. No reversible ischemia or infarction. 2. Normal left ventricular wall motion. 3. Left ventricular ejection fraction 62% 4. Low-risk stress test findings.  EEG 12/11/14 - This is an abnormal EEG recording secondary to  intermittent dysrhythmic theta activity and sharp transients  emanating from the left frontotemporal area. This would suggest a  lowered seizure threshold with a left brain focus. This recording  does not correlate well with the clinical events, however.  Clinical correlation is required.  Lab Review Component     Latest Ref Rng 06/27/2014 07/04/2014 09/29/2014 10/06/2014  Cholesterol     0 - 200 mg/dL 143  120   Triglycerides     <150 mg/dL 243.0 (H)  110   HDL     >39 mg/dL 52.10  56   VLDL     0 - 40 mg/dL 48.6 (H)  22   Total CHOL/HDL Ratio  3  2.1   NonHDL      90.90     LDL (calc)     0 - 99 mg/dL   42   Hgb A1c MFr Bld     4.6 - 6.5 %  6.9 (H) 6.8 (H) 6.9 (H)  Mean Plasma Glucose     <117 mg/dL   148 (H)   TSH     0.35 - 4.50 uIU/mL 0.65     D-Dimer, Quant     0.00 - 0.48 ug/mL-FEU   0.48      Assessment:   In summary, Kelly Kennedy is a 74 y.o. female with PMH of HTN, DM, HLD and headache follow up in clinic for episodic left sided numbness. She had episode of chest pain, palpitation, and headache. Cardiac work up so far negative.  Pt also has episodes of transient left UE and LE numbness and weakness, lasting 10-75min with or without headache. Stroke work up also negative so far, including MRI and MRA, 2D echo, CUS, 30 day cardiac monitoring and LDL. Her A1C was mildly high at 6.9 and she is on estrogen patch for hot flashes. DDx include TIA, focal seizure and complicated migraine. She is on ASA and crestor for stroke prevention. EEG showed left frontotemporal slow and sharp transients which can not relate to her left sided symptoms. During interval tim, she still has intermittent left hand and foot tingling, may related to diabetic neuropathy but she denies right hand or foot tingling. Her headache is nearly gone. She was diagnosed with left breast cancer DCIS s/p mastectomy and on tamoxifen. She stated left arm numbness with movement after the breast surgery. Crestor currently off due to SE with generic form crestor.    Plan: - continue ASA for stroke prevention - discuss with Dr. Damita Dunnings to check lipid panel and decide on statin medications - check BP at home - regular exercise  - repeat EEG  - Follow up with your primary care physician for stroke risk factor modification. Recommend maintain blood pressure goal <130/80, diabetes with hemoglobin A1c goal below 6.5% and lipids with LDL cholesterol goal below 70 mg/dL.   - follow up in 6 months.  I spent more than 25 minutes of face to face time with the patient. Greater than 50% of time was spent in counseling and coordination of care. We have discussed about importance of repeat EEG, follow up with PCP for lipid panel and oncology for continued care of breast cancer, as well as HA diary and DM control.    Orders Placed This Encounter  Procedures  . EEG adult    Abnormal EEG 11/2014. Please compare. Thanks.    Standing Status: Future     Number of Occurrences:      Standing Expiration Date: 09/28/2016    Order Specific Question:  Where should this test be performed?     Answer:  [facility]    Meds ordered this encounter  Medications  . VOLTAREN 1 % GEL    Sig:   . traMADol (ULTRAM) 50 MG tablet    Sig:   . nitroGLYCERIN (NITROSTAT) 0.4 MG SL tablet    Sig:   . nystatin (MYCOSTATIN) 100000 UNIT/ML suspension    Sig: TAKE 5 MLS (500,000 UNITS TOTAL) BY MOUTH 4 (FOUR) TIMES DAILY. SWISH AND SWALLOW.    Refill:  1  . COMBIGAN 0.2-0.5 % ophthalmic solution    Sig: PLACE 1 DROP INTO BOTH EYES TWICE A DAY  Refill:  2    Patient Instructions  - continue ASA for stroke prevention - discuss with Dr. Damita Dunnings to check lipid panel and decide on statin medications - check BP at home - regular exercise  - repeat EEG and compare with previous one - Follow up with your primary care physician for stroke risk factor modification. Recommend maintain blood pressure goal <130/80, diabetes with hemoglobin A1c goal below 6.5% and lipids with LDL cholesterol goal below 70 mg/dL.   - follow up in 6 months.    Rosalin Hawking, MD PhD [address] [facility] [address], [place], [place] [address] [phone]

## 2015-10-01 ENCOUNTER — Telehealth: Payer: Self-pay

## 2015-10-01 MED ORDER — GLIPIZIDE 5 MG PO TABS: 2.5000 mg | ORAL_TABLET | Freq: Every day | ORAL | Status: DC

## 2015-10-01 NOTE — Telephone Encounter (Signed)
rx sent.  Push the 10/06/15 OV back unless she has an acute issue.   Okay to recheck sugar in about 11/2015, labs ahead of time.  She had extra A1c done in 07/2016.   Thanks.

## 2015-10-01 NOTE — Telephone Encounter (Signed)
Pt left v/m requesting refill glipizide 5 mg. Left v/m for pt to cb; med list has glipizide 2.5 mg listed and need to know pharmacy. Pt last diabetic f/u appt 05/12/15 and pt to have another f/u appt 3-4 months. Pt seen f/u appt on 08/25/15 but do not see where diabetes was discussed. No future appt scheduled. Will wait for pt to cb.

## 2015-10-01 NOTE — Telephone Encounter (Signed)
Pt called back and Dr Reyne Dumas prescribed glipizide 5 mg taking 1/2 tab daily before breakfast. This is how pt has been taking Glipizide even though different instructions on med list. Is it OK to refill med to [facility] with new instructions. Pt scheduled f/u appt for diabetes with Dr Damita Dunnings on 10/06/15 at 11:30 AM.

## 2015-10-02 NOTE — Telephone Encounter (Signed)
Noted. Thanks.

## 2015-10-02 NOTE — Telephone Encounter (Signed)
Patient notified.  She prefers to keep the 11/8 appointment.  She was advised to schedule a f/u per neurology to address BP.

## 2015-10-04 IMAGING — CR DG CHEST 2V
2 series · 2 of 2 positions shown · non-contrast
Comparison: October 17, 2013

CLINICAL DATA: Two week history of cardiac arrhythmia and
difficulty breathing

EXAM:
CHEST  2 VIEW

[w chest pa]
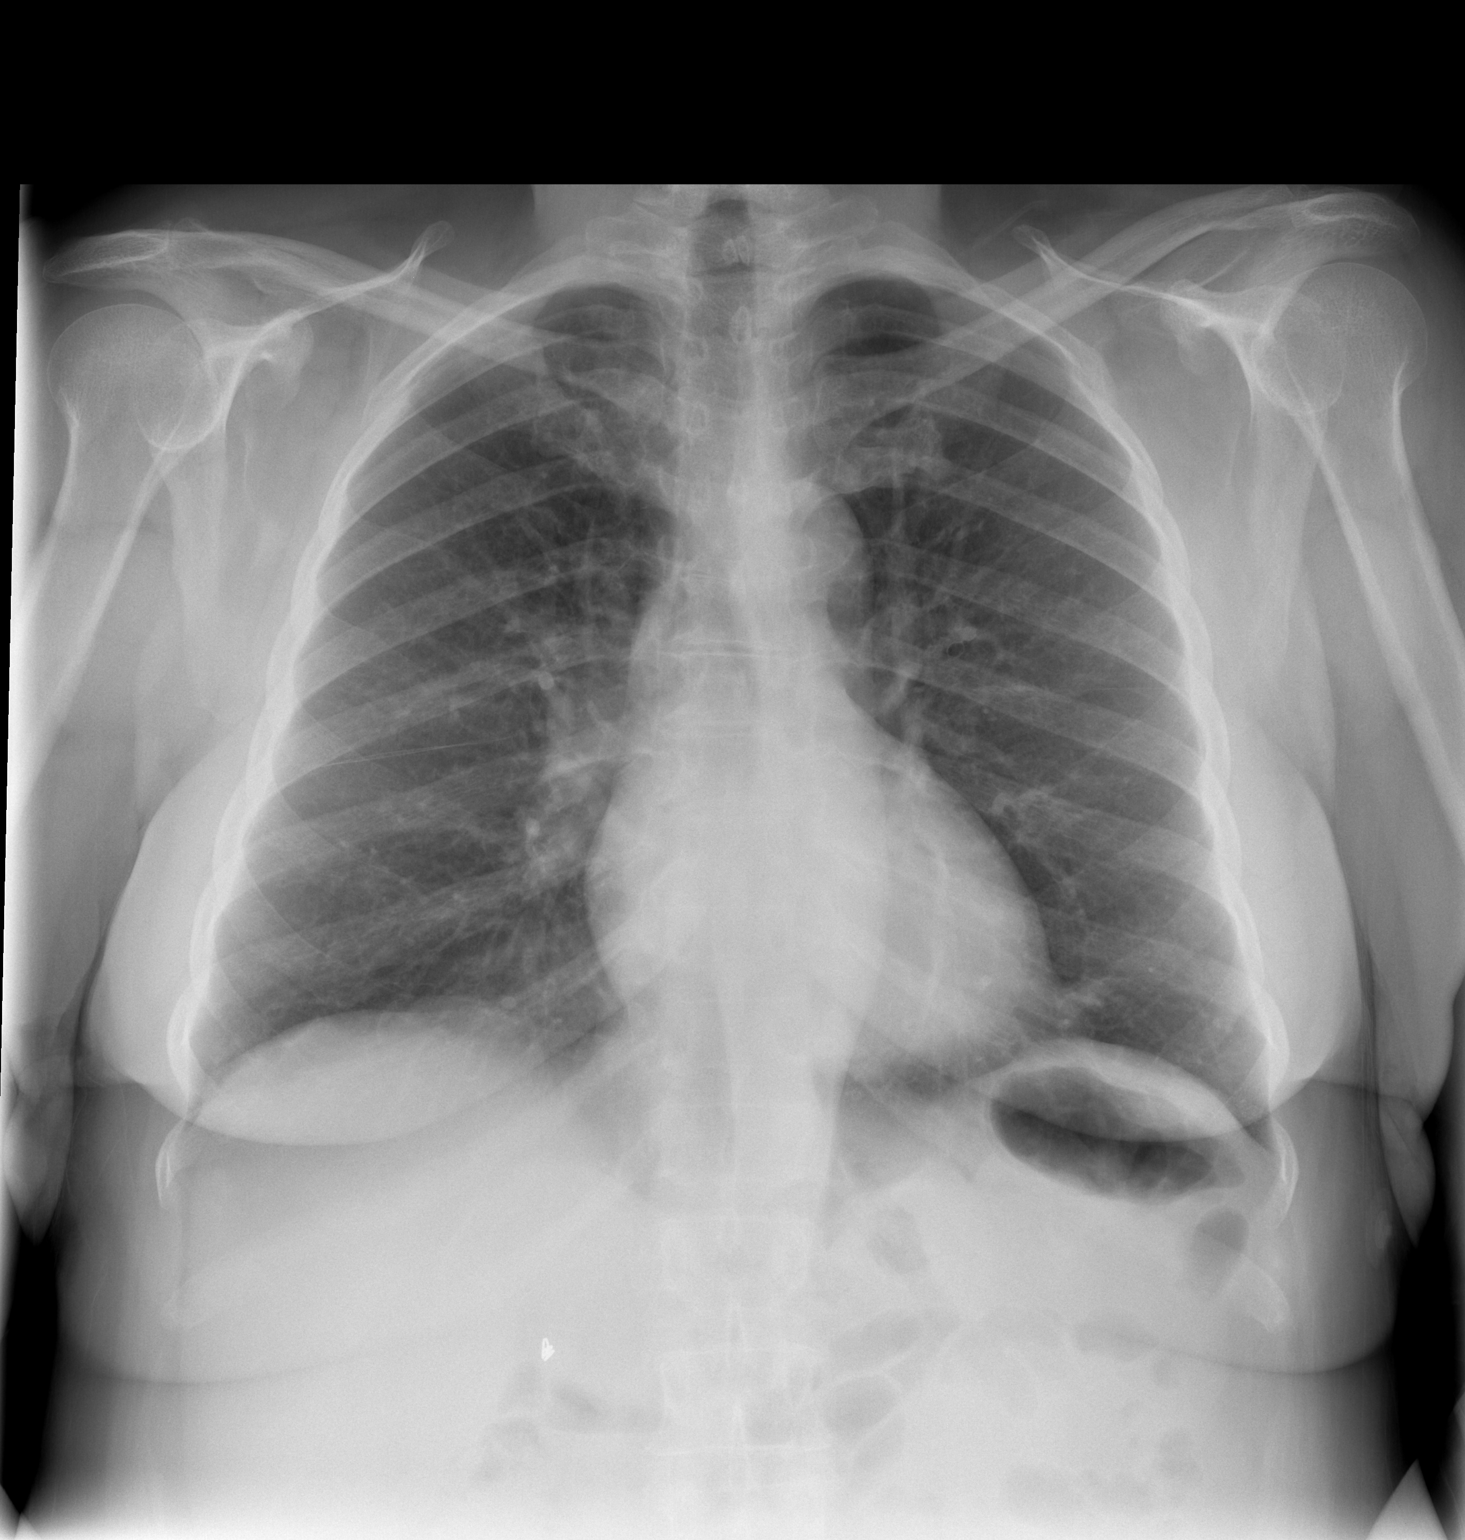

[w chest lat]
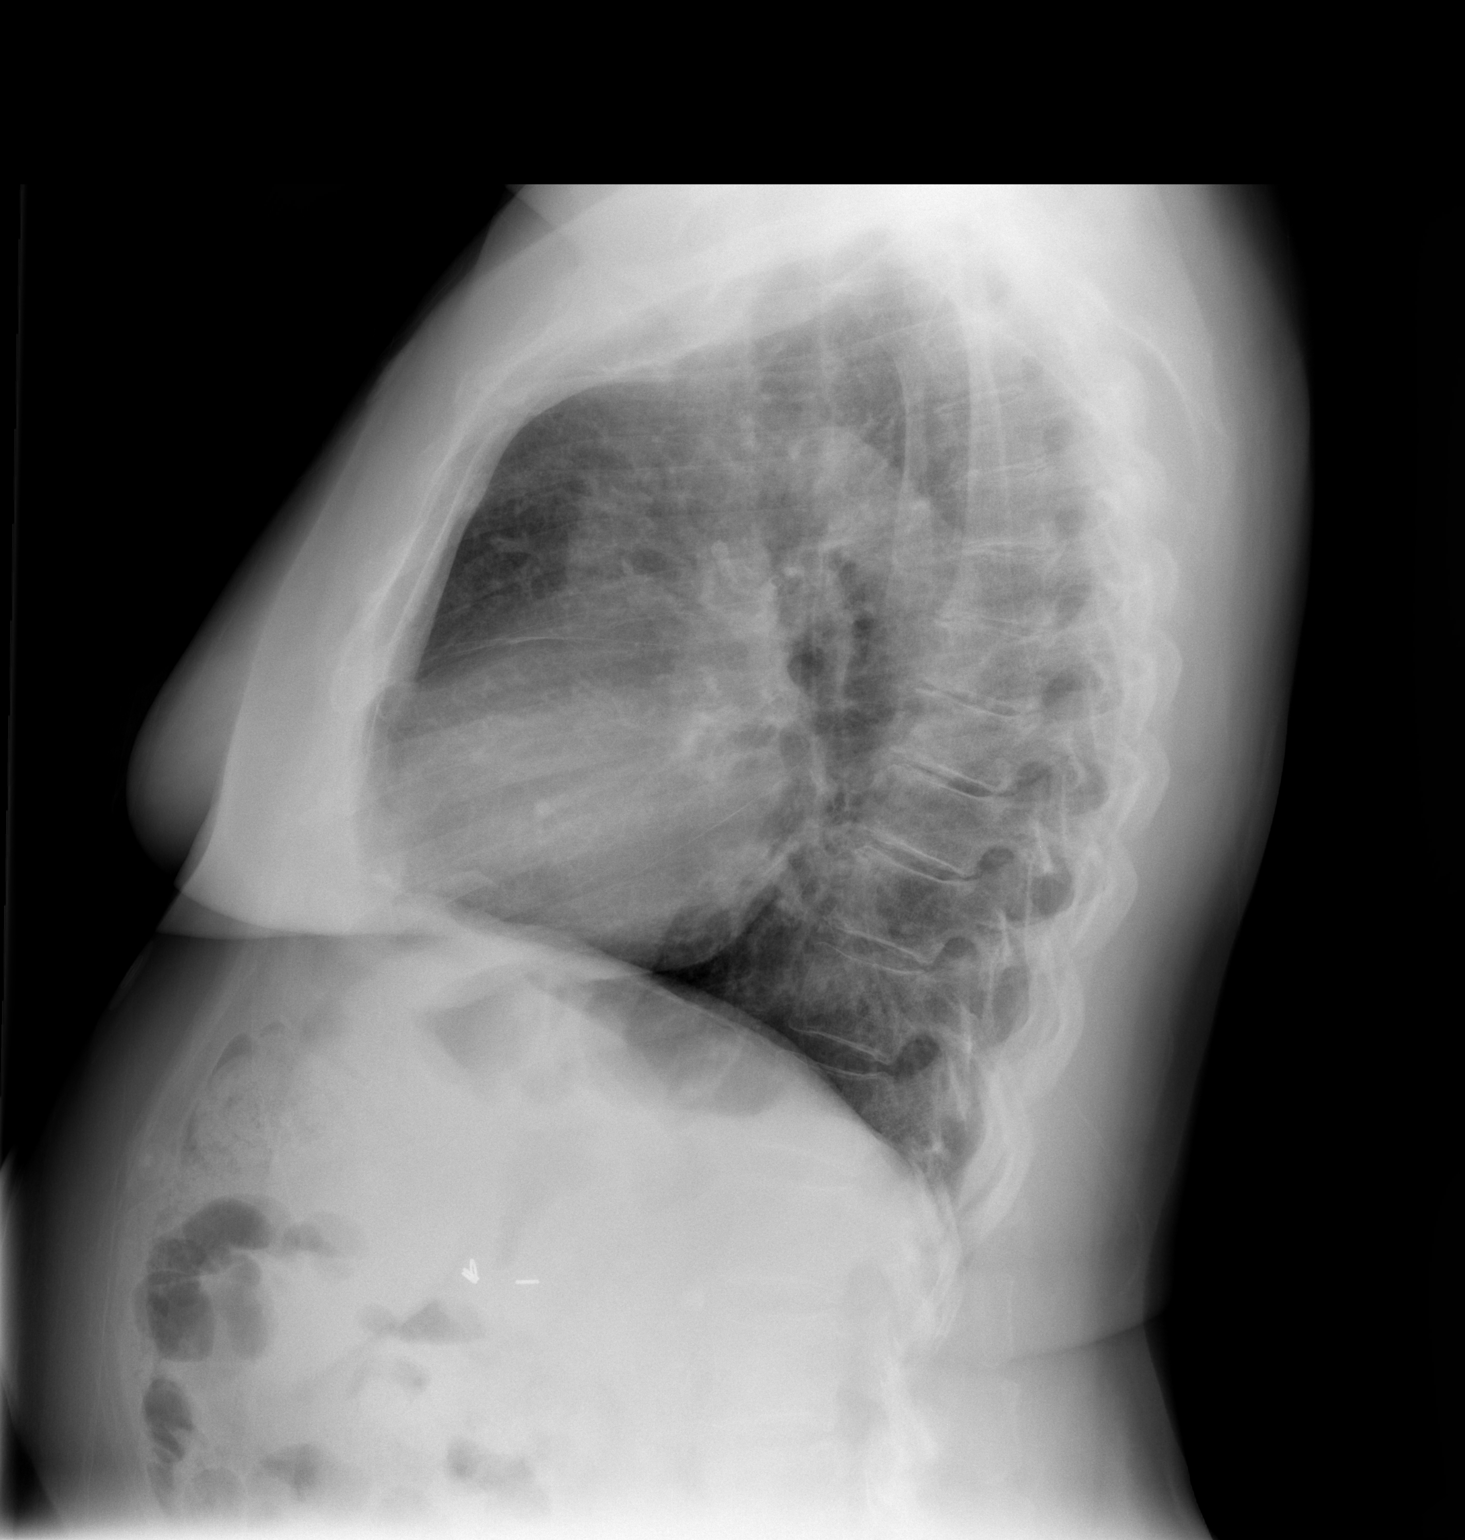

[2 of 2 positions shown; findings below may reference images not displayed]

FINDINGS: There is slight scarring in the left base region. There is no edema
or consolidation. The heart size and pulmonary vascularity are
normal. No adenopathy. There is mild degenerative change in the
thoracic spine.
IMPRESSION: No edema or consolidation.

## 2015-10-04 IMAGING — CT CT HEAD W/O CM
2 series · 16 of 30 positions shown, 20 images · non-contrast
Comparison: None

CLINICAL DATA: Tingling and numbness in the left arm and leg.
Paresthesias.

EXAM:
CT HEAD WITHOUT CONTRAST
TECHNIQUE: Contiguous axial images were obtained from the base of the skull
through the vertex without contrast.

[Series 201: head w/o, idose (1) · axial · non-contrast · 0.49mm/px · z∈[+83,+203]mm · 13 of 30 slices shown, 17 images]
[im 3/30  brain]
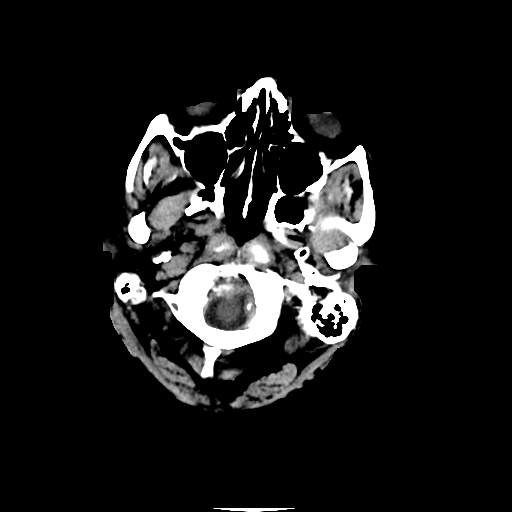
[im 3/30  bone]
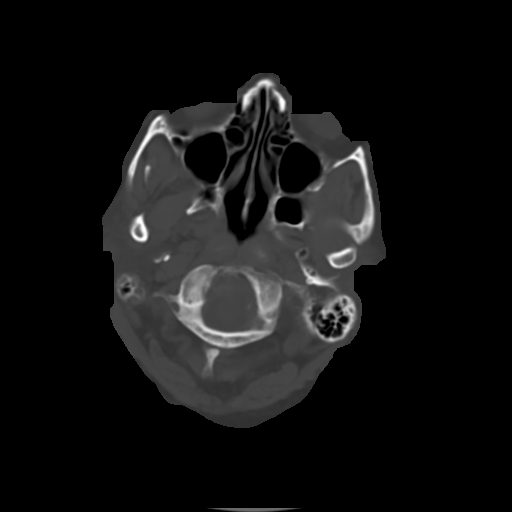
[im 5/30  brain]
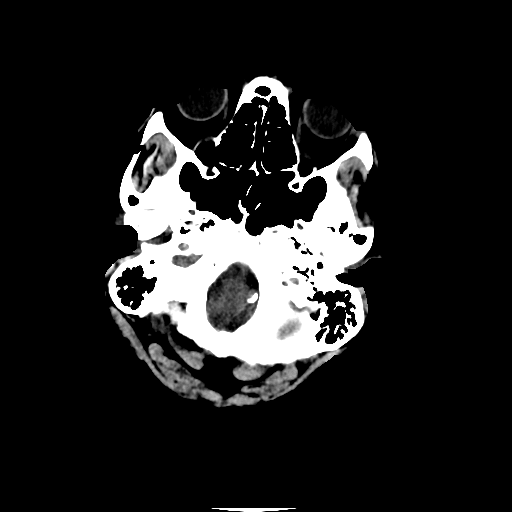
[im 7/30  brain]
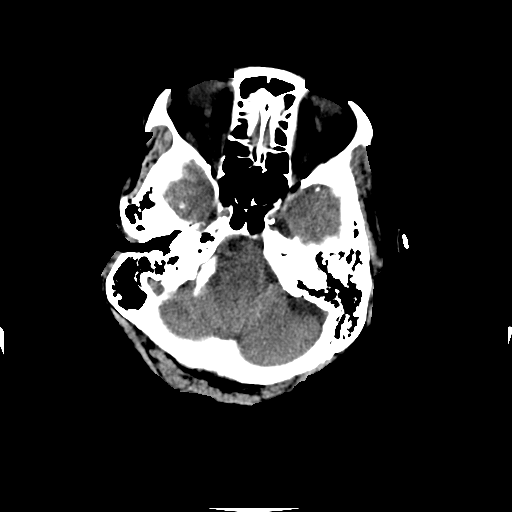
[im 9/30  brain]
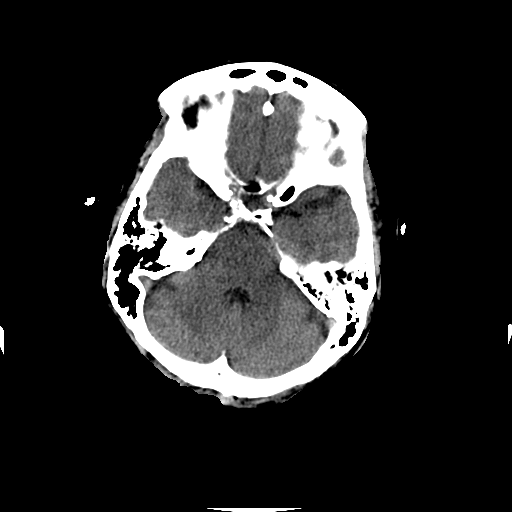
[im 11/30  brain]
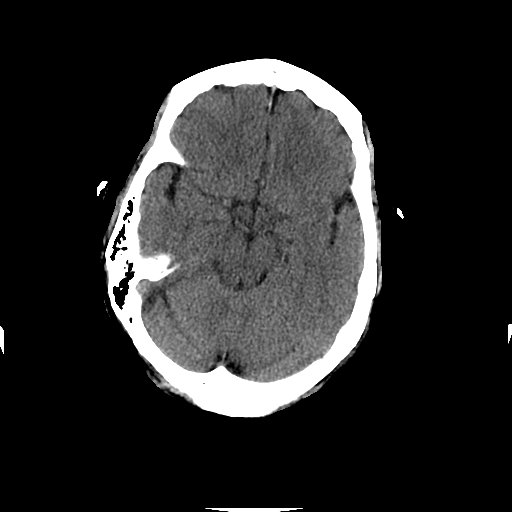
[im 11/30  bone]
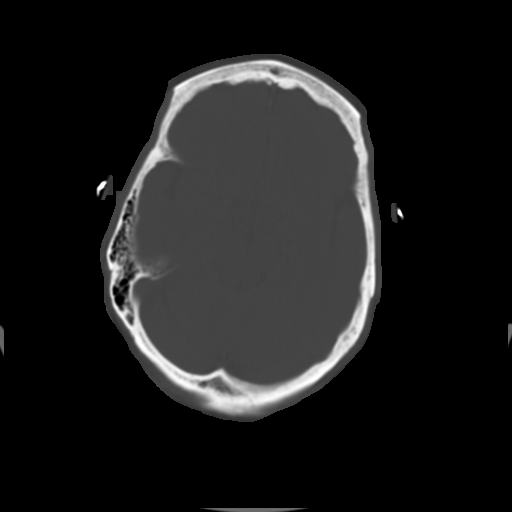
[im 13/30  brain]
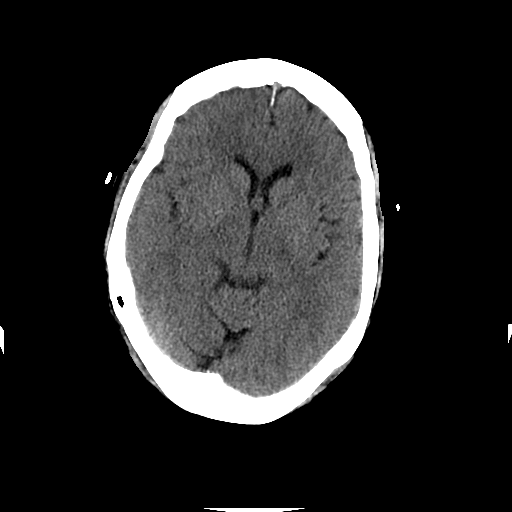
[im 15/30  brain]
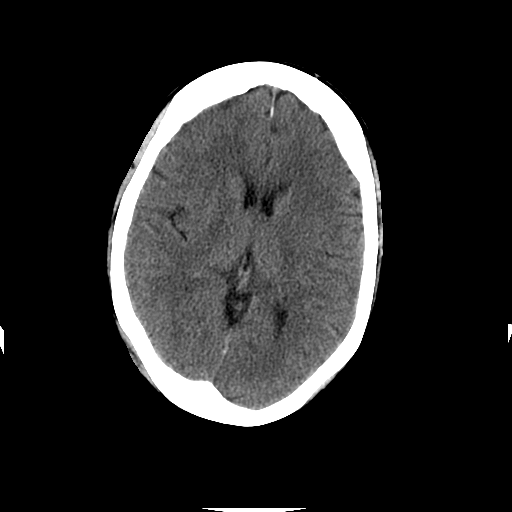
[im 17/30  brain]
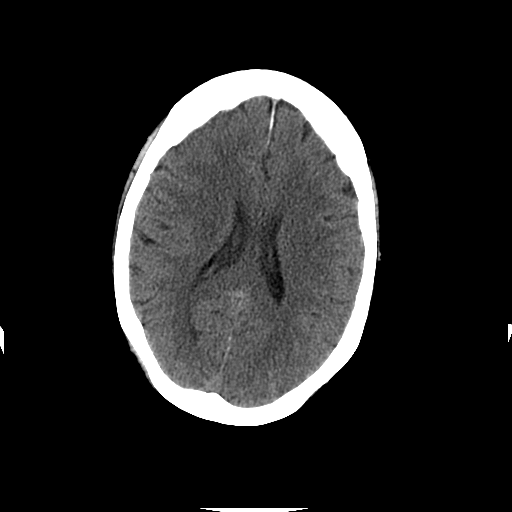
[im 19/30  brain]
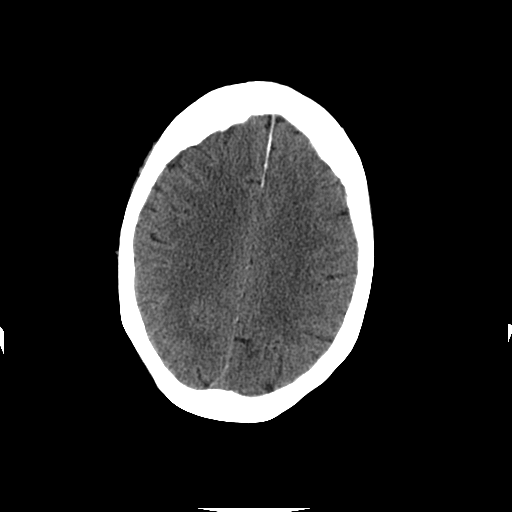
[im 19/30  bone]
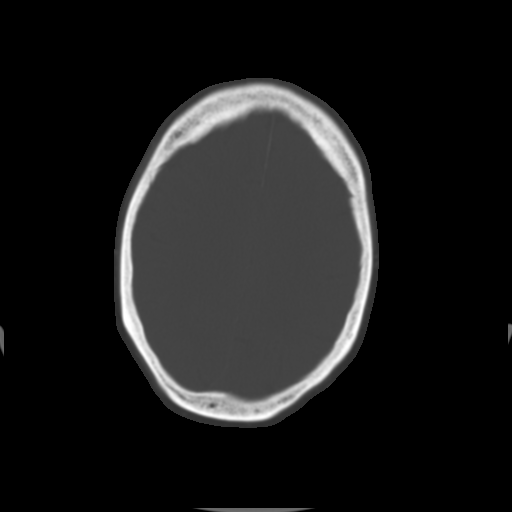
[im 21/30  brain]
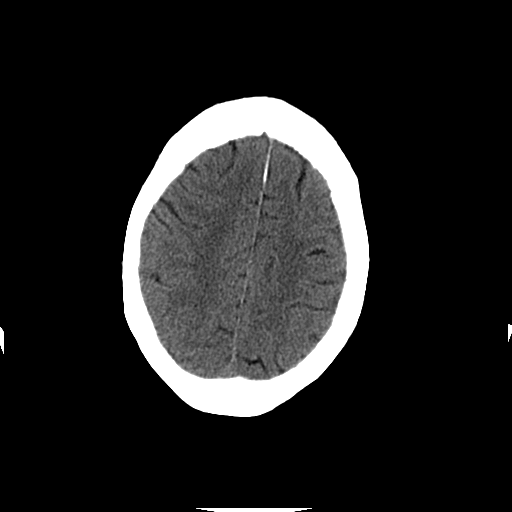
[im 23/30  brain]
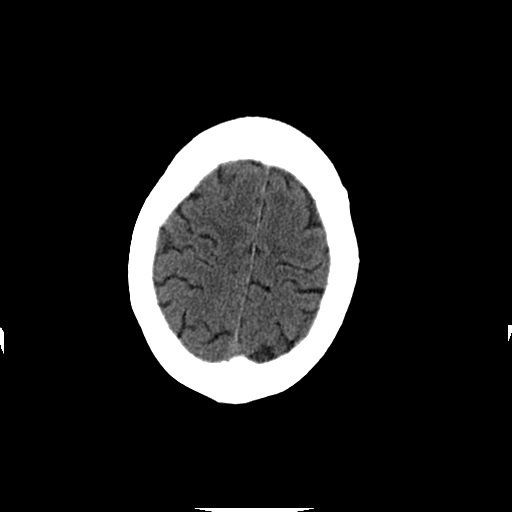
[im 25/30  brain]
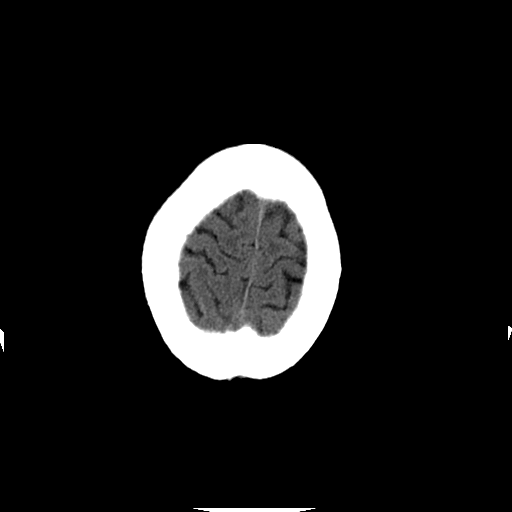
[im 27/30  brain]
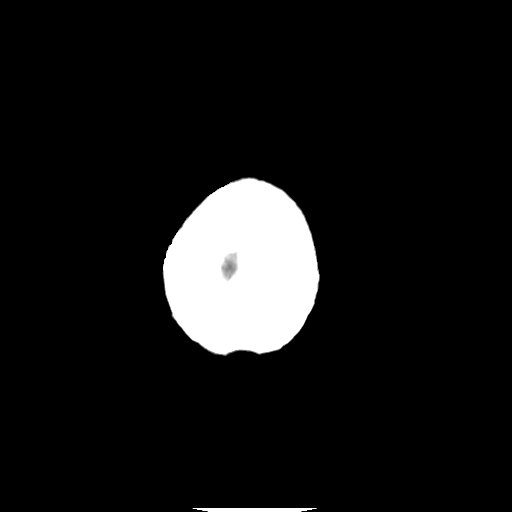
[im 27/30  bone]
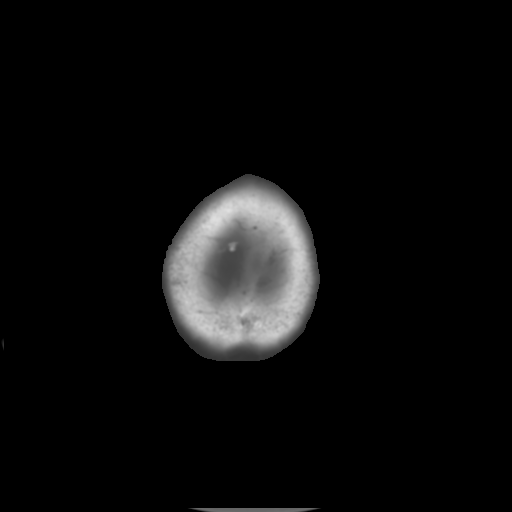

[Series 202: head w/o bone, idose (1) · axial · non-contrast · 0.49mm/px · z∈[+83,+123]mm · 3 of 30 slices shown]
[im 3/30  bone]
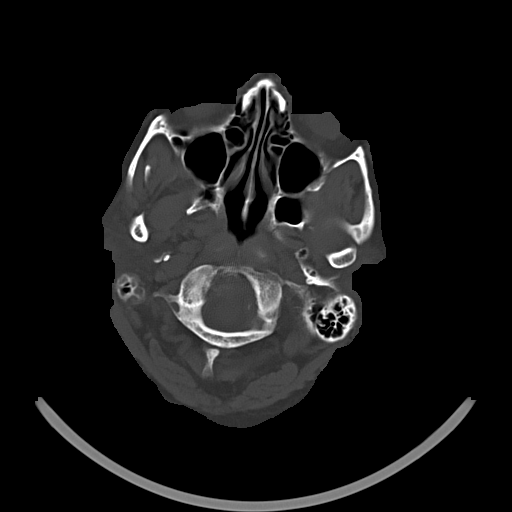
[im 7/30  bone]
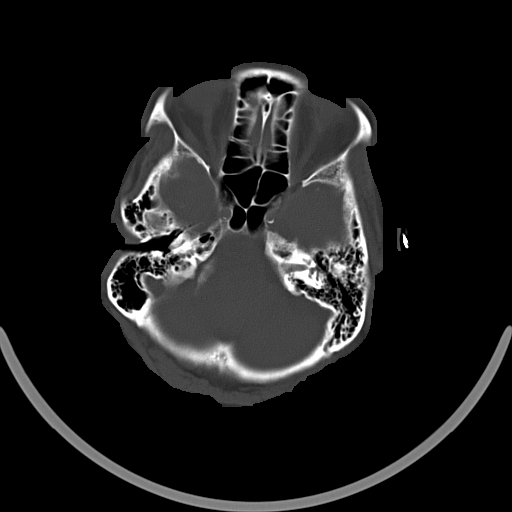
[im 11/30  bone]
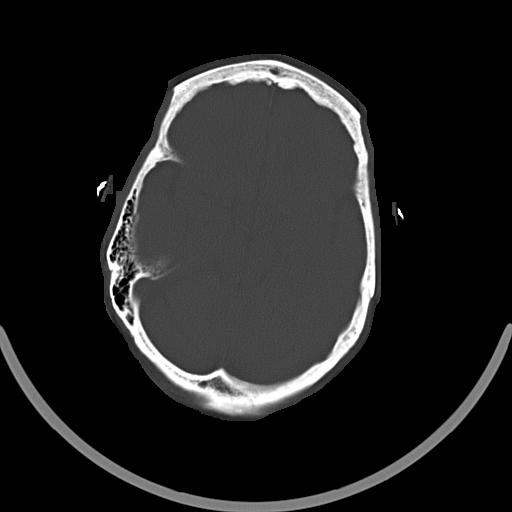

[16 of 30 positions shown; findings below may reference images not displayed]

FINDINGS: Normal appearance of the intracranial structures. No evidence for
acute hemorrhage, mass lesion, midline shift, hydrocephalus or large
infarct. No acute bony abnormality. The visualized sinuses are
clear.
IMPRESSION: No acute intracranial abnormality.

## 2015-10-06 ENCOUNTER — Ambulatory Visit (INDEPENDENT_AMBULATORY_CARE_PROVIDER_SITE_OTHER): Payer: Commercial Managed Care - HMO | Admitting: Family Medicine

## 2015-10-06 VITALS — BP 110/70 | HR 80 | Temp 97.9°F | Wt 186.0 lb

## 2015-10-06 DIAGNOSIS — I10 Essential (primary) hypertension: Secondary | ICD-10-CM | POA: Diagnosis not present

## 2015-10-06 DIAGNOSIS — N760 Acute vaginitis: Secondary | ICD-10-CM

## 2015-10-06 DIAGNOSIS — E785 Hyperlipidemia, unspecified: Secondary | ICD-10-CM | POA: Diagnosis not present

## 2015-10-06 DIAGNOSIS — E1159 Type 2 diabetes mellitus with other circulatory complications: Secondary | ICD-10-CM | POA: Diagnosis not present

## 2015-10-06 MED ORDER — AMLODIPINE BESYLATE 5 MG PO TABS
2.5000 mg | ORAL_TABLET | Freq: Every day | ORAL | Status: DC
Start: 2015-10-06 — End: 2016-04-12

## 2015-10-06 MED ORDER — ROSUVASTATIN CALCIUM 10 MG PO TABS: 5.0000 mg | ORAL_TABLET | Freq: Every day | ORAL | Status: DC

## 2015-10-06 MED ORDER — FLUCONAZOLE 150 MG PO TABS: ORAL_TABLET | ORAL | Status: DC

## 2015-10-06 NOTE — Patient Instructions (Signed)
Cut the amlodipine back to 2.5mg  a day (1/2 tab).  If you are still lightheaded and your BP is still <130/<90, then stop the medicine.  Take diflucan weekly if needed but try to stretch it out to every other week.  If you keep having yeast infections, then call the cancer clinic about the tamoxifen.   When your BP is levelled out, then try taking a 1/2 tab of the cholesterol medicine (rosuvastatin). Take care.  Glad to see you.

## 2015-10-07 ENCOUNTER — Encounter: Payer: Self-pay | Admitting: Family Medicine

## 2015-10-07 NOTE — Progress Notes (Signed)
Lightheaded with standing, brief sx but recurrent.  Will self resolve.  On CCB.  Not SOB.  Some BLE edema, at baseline.  No other ADE on BP meds currently.   Has had recurrently fungal vaginitis since started on tamoxifen.  D/w pt about options.  Itching and irritation with some discharge, had improved temporarily with diflucan prev.  Now with return of sx.   HA when on statin. D/w pt . Clearly started when on med, resolved off med.  Not on med currently.  dw pt about secondary prevention for CVA/TIA.  Now off HRT.  D/w pt.    PMH and SH reviewed  ROS: See HPI, otherwise noncontributory.  Meds, vitals, and allergies reviewed.   GEN: nad, alert and oriented HEENT: mucous membranes moist NECK: supple w/o LA CV: rrr. PULM: ctab, no inc wob ABD: soft, +bs EXT: 1+ BLE edema at baseline.

## 2015-10-07 NOTE — Assessment & Plan Note (Signed)
Worse, with lightheadedness from overtreatment.  Will dec amlodipine to 2.5mg  a day and she'll update me as needed.  We may need to stop med totally.  Lower dose may help edema.  She agrees.

## 2015-10-07 NOTE — Assessment & Plan Note (Signed)
Worse again, can intermittently use diflucan and will have her call onc if sx continue.  No thrush on exam today.

## 2015-10-07 NOTE — Assessment & Plan Note (Signed)
Will try half dose of statin to see if she can tolerate w/o HA.  She'll update me.  Will do this after BP sorted out, as to only make 1 change at a time. She agrees.

## 2015-10-07 NOTE — Assessment & Plan Note (Signed)
Prev reasonable with level 6.9.  D/w pt.  Typical goal 6.5 but wouldn't want to risk hypoglycemia at this point.  She agrees.

## 2015-10-16 DIAGNOSIS — H11153 Pinguecula, bilateral: Secondary | ICD-10-CM | POA: Diagnosis not present

## 2015-10-16 DIAGNOSIS — H401131 Primary open-angle glaucoma, bilateral, mild stage: Secondary | ICD-10-CM | POA: Diagnosis not present

## 2015-10-16 DIAGNOSIS — H35363 Drusen (degenerative) of macula, bilateral: Secondary | ICD-10-CM | POA: Diagnosis not present

## 2015-10-16 DIAGNOSIS — H353131 Nonexudative age-related macular degeneration, bilateral, early dry stage: Secondary | ICD-10-CM | POA: Diagnosis not present

## 2015-10-16 DIAGNOSIS — H04123 Dry eye syndrome of bilateral lacrimal glands: Secondary | ICD-10-CM | POA: Diagnosis not present

## 2015-10-16 DIAGNOSIS — H18413 Arcus senilis, bilateral: Secondary | ICD-10-CM | POA: Diagnosis not present

## 2015-10-16 DIAGNOSIS — H26493 Other secondary cataract, bilateral: Secondary | ICD-10-CM | POA: Diagnosis not present

## 2015-10-16 DIAGNOSIS — H11423 Conjunctival edema, bilateral: Secondary | ICD-10-CM | POA: Diagnosis not present

## 2015-10-16 DIAGNOSIS — Z961 Presence of intraocular lens: Secondary | ICD-10-CM | POA: Diagnosis not present

## 2015-10-19 DIAGNOSIS — K219 Gastro-esophageal reflux disease without esophagitis: Secondary | ICD-10-CM | POA: Diagnosis not present

## 2015-10-19 DIAGNOSIS — I209 Angina pectoris, unspecified: Secondary | ICD-10-CM | POA: Diagnosis not present

## 2015-10-19 DIAGNOSIS — C50912 Malignant neoplasm of unspecified site of left female breast: Secondary | ICD-10-CM | POA: Diagnosis not present

## 2015-10-19 DIAGNOSIS — E782 Mixed hyperlipidemia: Secondary | ICD-10-CM | POA: Diagnosis not present

## 2015-10-19 DIAGNOSIS — I69334 Monoplegia of upper limb following cerebral infarction affecting left non-dominant side: Secondary | ICD-10-CM | POA: Diagnosis not present

## 2015-10-19 DIAGNOSIS — Z0000 Encounter for general adult medical examination without abnormal findings: Secondary | ICD-10-CM | POA: Diagnosis not present

## 2015-10-19 DIAGNOSIS — E1169 Type 2 diabetes mellitus with other specified complication: Secondary | ICD-10-CM | POA: Diagnosis not present

## 2015-10-19 DIAGNOSIS — I10 Essential (primary) hypertension: Secondary | ICD-10-CM | POA: Diagnosis not present

## 2015-10-19 DIAGNOSIS — E039 Hypothyroidism, unspecified: Secondary | ICD-10-CM | POA: Diagnosis not present

## 2015-10-19 DIAGNOSIS — Z6835 Body mass index (BMI) 35.0-35.9, adult: Secondary | ICD-10-CM | POA: Diagnosis not present

## 2015-10-26 ENCOUNTER — Other Ambulatory Visit: Payer: Commercial Managed Care - HMO

## 2015-11-04 ENCOUNTER — Ambulatory Visit (INDEPENDENT_AMBULATORY_CARE_PROVIDER_SITE_OTHER): Payer: Commercial Managed Care - HMO | Admitting: Neurology

## 2015-11-04 DIAGNOSIS — R200 Anesthesia of skin: Secondary | ICD-10-CM

## 2015-11-04 NOTE — Procedures (Signed)
    History:  Kelly Kennedy is a 74 year old gentleman with a history of episodes of left-sided numbness and weakness. Prior study done on the 14th of January 2016 revealed evidence of left frontotemporal slowing and dysrhythmic activity. The patient is being reevaluated for these episodes.  This is a routine EEG. No skull defects are noted. Medications include Norvasc, Combigan, Diflucan, Lasix, glipizide, Synthroid, magnesium supplementation, nitroglycerin, Prilosec, Crestor, tamoxifen, Ultram, and Voltaren gel.   EEG classification: Normal awake and drowsy  Description of the recording: The background rhythms of this recording consists of a fairly well modulated medium amplitude alpha rhythm of 9 Hz that is reactive to eye opening and closure. As the record progresses, the patient appears to remain in the waking state throughout the recording. Photic stimulation was performed, resulting in a bilateral and symmetric photic driving response. Hyperventilation was also performed, resulting in a minimal buildup of the background rhythm activities without significant slowing seen. Toward the end of the recording, the patient enters the drowsy state with slight symmetric slowing seen. The patient never enters stage II sleep. At no time during the recording does there appear to be evidence of spike or spike wave discharges or evidence of focal slowing. EKG monitor shows no evidence of cardiac rhythm abnormalities with a heart rate of 96.  Impression: This is a normal EEG recording in the waking and drowsy state. No evidence of ictal or interictal discharges are seen. Left frontotemporal slowing and sharp transients are not seen on this recording.

## 2015-11-05 ENCOUNTER — Telehealth: Payer: Self-pay | Admitting: *Deleted

## 2015-11-05 NOTE — Telephone Encounter (Signed)
Called pt about normal EEG per Dr Erlinda Hong. Pt verbalized understanding.

## 2015-11-05 NOTE — Telephone Encounter (Signed)
-----   Message from Rosalin Hawking, MD sent at 11/04/2015 11:32 PM EST ----- Could you please let the pt know that her EEG done today in our office was normal EEG. Continue current treatment. Thanks.  Rosalin Hawking, MD PhD Stroke Neurology 11/04/2015 11:31 PM

## 2015-11-10 ENCOUNTER — Emergency Department (HOSPITAL_COMMUNITY)
Admission: EM | Admit: 2015-11-10 | Discharge: 2015-11-10 | Disposition: A | Discharge status: Home / Self Care | Payer: Commercial Managed Care - HMO | Attending: Emergency Medicine | Admitting: Emergency Medicine

## 2015-11-10 ENCOUNTER — Encounter (HOSPITAL_COMMUNITY): Payer: Self-pay | Admitting: *Deleted

## 2015-11-10 DIAGNOSIS — Z8673 Personal history of transient ischemic attack (TIA), and cerebral infarction without residual deficits: Secondary | ICD-10-CM | POA: Diagnosis not present

## 2015-11-10 DIAGNOSIS — Z7982 Long term (current) use of aspirin: Secondary | ICD-10-CM | POA: Insufficient documentation

## 2015-11-10 DIAGNOSIS — G4700 Insomnia, unspecified: Secondary | ICD-10-CM | POA: Insufficient documentation

## 2015-11-10 DIAGNOSIS — E039 Hypothyroidism, unspecified: Secondary | ICD-10-CM | POA: Diagnosis not present

## 2015-11-10 DIAGNOSIS — F329 Major depressive disorder, single episode, unspecified: Secondary | ICD-10-CM | POA: Diagnosis not present

## 2015-11-10 DIAGNOSIS — Z87891 Personal history of nicotine dependence: Secondary | ICD-10-CM | POA: Insufficient documentation

## 2015-11-10 DIAGNOSIS — I10 Essential (primary) hypertension: Secondary | ICD-10-CM | POA: Insufficient documentation

## 2015-11-10 DIAGNOSIS — E785 Hyperlipidemia, unspecified: Secondary | ICD-10-CM | POA: Diagnosis not present

## 2015-11-10 DIAGNOSIS — E119 Type 2 diabetes mellitus without complications: Secondary | ICD-10-CM | POA: Diagnosis not present

## 2015-11-10 DIAGNOSIS — Z79899 Other long term (current) drug therapy: Secondary | ICD-10-CM | POA: Insufficient documentation

## 2015-11-10 DIAGNOSIS — N390 Urinary tract infection, site not specified: Secondary | ICD-10-CM | POA: Diagnosis not present

## 2015-11-10 DIAGNOSIS — Z853 Personal history of malignant neoplasm of breast: Secondary | ICD-10-CM | POA: Diagnosis not present

## 2015-11-10 DIAGNOSIS — R197 Diarrhea, unspecified: Secondary | ICD-10-CM | POA: Diagnosis not present

## 2015-11-10 DIAGNOSIS — R112 Nausea with vomiting, unspecified: Secondary | ICD-10-CM | POA: Diagnosis present

## 2015-11-10 LAB — COMPREHENSIVE METABOLIC PANEL
ALBUMIN: 3.4 g/dL — AB (ref 3.5–5.0)
ALT: 32 U/L (ref 14–54)
AST: 49 U/L — AB (ref 15–41)
Alkaline Phosphatase: 54 U/L (ref 38–126)
Anion gap: 11 (ref 5–15)
BILIRUBIN TOTAL: 0.8 mg/dL (ref 0.3–1.2)
BUN: 11 mg/dL (ref 6–20)
CHLORIDE: 104 mmol/L (ref 101–111)
CO2: 21 mmol/L — ABNORMAL LOW (ref 22–32)
CREATININE: 1.13 mg/dL — AB (ref 0.44–1.00)
Calcium: 8.8 mg/dL — ABNORMAL LOW (ref 8.9–10.3)
GFR calc Af Amer: 54 mL/min — ABNORMAL LOW (ref >60)
GFR calc non Af Amer: 47 mL/min — ABNORMAL LOW (ref >60)
GLUCOSE: 163 mg/dL — AB (ref 65–99)
POTASSIUM: 3.1 mmol/L — AB (ref 3.5–5.1)
Sodium: 136 mmol/L (ref 135–145)
Total Protein: 6.5 g/dL (ref 6.5–8.1)

## 2015-11-10 LAB — URINALYSIS, ROUTINE W REFLEX MICROSCOPIC
BILIRUBIN URINE: NEGATIVE
GLUCOSE, UA: NEGATIVE mg/dL
HGB URINE DIPSTICK: NEGATIVE
KETONES UR: 15 mg/dL — AB
Nitrite: NEGATIVE
PH: 5.5 (ref 5.0–8.0)
Protein, ur: NEGATIVE mg/dL
Specific Gravity, Urine: 1.019 (ref 1.005–1.030)

## 2015-11-10 LAB — CBC
HEMATOCRIT: 39.1 % (ref 36.0–46.0)
Hemoglobin: 12.8 g/dL (ref 12.0–15.0)
MCH: 28.3 pg (ref 26.0–34.0)
MCHC: 32.7 g/dL (ref 30.0–36.0)
MCV: 86.3 fL (ref 78.0–100.0)
Platelets: 343 10*3/uL (ref 150–400)
RBC: 4.53 MIL/uL (ref 3.87–5.11)
RDW: 14.8 % (ref 11.5–15.5)
WBC: 6.1 10*3/uL (ref 4.0–10.5)

## 2015-11-10 LAB — URINE MICROSCOPIC-ADD ON

## 2015-11-10 LAB — LIPASE, BLOOD: LIPASE: 25 U/L (ref 11–51)

## 2015-11-10 MED ORDER — ADVANCED PROBIOTIC PO CAPS: 1.0000 | ORAL_CAPSULE | Freq: Every morning | ORAL | Status: DC

## 2015-11-10 MED ORDER — ONDANSETRON HCL 4 MG PO TABS: 4.0000 mg | ORAL_TABLET | Freq: Four times a day (QID) | ORAL | Status: DC

## 2015-11-10 MED ORDER — OXYCODONE-ACETAMINOPHEN 5-325 MG PO TABS
1.0000 | ORAL_TABLET | Freq: Once | ORAL | Status: AC
  Administered 2015-11-10: 1 via ORAL
  Filled 2015-11-10: qty 1

## 2015-11-10 MED ORDER — ONDANSETRON 4 MG PO TBDP
ORAL_TABLET | ORAL | Status: AC
  Filled 2015-11-10: qty 1

## 2015-11-10 MED ORDER — ONDANSETRON 4 MG PO TBDP
4.0000 mg | ORAL_TABLET | Freq: Once | ORAL | Status: AC
  Administered 2015-11-10: 4 mg via ORAL

## 2015-11-10 MED ORDER — CEPHALEXIN 500 MG PO CAPS: 500.0000 mg | ORAL_CAPSULE | Freq: Four times a day (QID) | ORAL | Status: DC

## 2015-11-10 MED ORDER — ONDANSETRON 4 MG PO TBDP
4.0000 mg | ORAL_TABLET | Freq: Once | ORAL | Status: AC | PRN
  Administered 2015-11-10: 4 mg via ORAL
  Filled 2015-11-10: qty 1

## 2015-11-10 NOTE — ED Notes (Signed)
Patient states she ate a tomato sandwich on Sunday and has been sick since then.  Thinks she may have food poisoning.

## 2015-11-10 NOTE — Discharge Instructions (Signed)
You were evaluated in the ED today and found to have a UTI. Please take your medications as prescribed. Take all of your medications, do not save or share them. Take your Zofran as needed for nausea. Follow up with your doctor in 3 days for reevaluation. Return to ED for any new or worsening symptoms including fevers, nausea or vomiting, worsening symptoms  Urinary Tract Infection A urinary tract infection (UTI) can occur any place along the urinary tract. The tract includes the kidneys, ureters, bladder, and urethra. A type of germ called bacteria often causes a UTI. UTIs are often helped with antibiotic medicine.  HOME CARE   If given, take antibiotics as told by your doctor. Finish them even if you start to feel better.  Drink enough fluids to keep your pee (urine) clear or pale yellow.  Avoid tea, drinks with caffeine, and bubbly (carbonated) drinks.  Pee often. Avoid holding your pee in for a long time.  Pee before and after having sex (intercourse).  Wipe from front to back after you poop (bowel movement) if you are a woman. Use each tissue only once. GET HELP RIGHT AWAY IF:   You have back pain.  You have lower belly (abdominal) pain.  You have chills.  You feel sick to your stomach (nauseous).  You throw up (vomit).  Your burning or discomfort with peeing does not go away.  You have a fever.  Your symptoms are not better in 3 days. MAKE SURE YOU:   Understand these instructions.  Will watch your condition.  Will get help right away if you are not doing well or get worse.   This information is not intended to replace advice given to you by your health care provider. Make sure you discuss any questions you have with your health care provider.   Document Released: 05/02/2008 Document Revised: 12/05/2014 Document Reviewed: 06/14/2012 Elsevier Interactive Patient Education [employer].

## 2015-11-10 NOTE — ED Notes (Signed)
Pt family calling out stating EDP has not been in room, apologized to pt and stated that EDP would be around shortly. Barbette Reichmann and Palumbo MD aware.

## 2015-11-10 NOTE — ED Provider Notes (Signed)
CSN: [id number]     Arrival date & time 11/10/15  0253 History   First MD Initiated Contact with Patient 11/10/15 [phone]     Chief Complaint  Patient presents with  . Abdominal Pain  . Nausea  . Emesis  . Diarrhea     (Consider location/radiation/quality/duration/timing/severity/associated sxs/prior Treatment) HPI Marchella L Manrique is a 74 y.o. female who comes in for evaluation of nausea, vomiting, diarrhea and abdominal pain. Patient reports yesterday after eating a tomato sandwich, she began to experience associated nausea, one episode of emesis (nonbloody, nonbilious), 2 loose stools-also nonbloody and associated mild abdominal discomfort. She also reports urinary hesitancy since yesterday. She denies any fevers, chills, back or flank pain. She did not try anything to improve her symptoms. Nothing seems to make it better or worse. No other modifying factors.  Past Medical History  Diagnosis Date  . Hyperlipidemia   . Diverticulosis of colon 06/2005  . Hypothyroid   . Insomnia   . Menopausal symptoms   . Hypertension   . Hyperglycemia   . adenomatous Colon polyps     colonoscopy 8/99, 12/02, 8/06  . Pericarditis 1980's  . Chest pain     uses NTG as needed  . Depression   . Bell's palsy   . GERD (gastroesophageal reflux disease)   . H/O hiatal hernia   . Headache(784.0)   . Arthritis   . Irritable bowel   . Diabetes mellitus without complication ([place])   . TIA (transient ischemic attack)     09/2014  . Ductal carcinoma in situ (DCIS) of left breast   . Cancer (HCC)     DCIS left breast  . Family history of adverse reaction to anesthesia     " my mother didn't wake up so they had to put her on life support for about an hour or more; my sisiter has problems waking up too from anesthesia.  . Stroke [facility])    Past Surgical History  Procedure Laterality Date  . Partial hysterectomy      ovaries intact  . Cholecystectomy  1990  . Ectopic pregnancy surgery  1971  .  Cystectomy  05/04/08    left lower arm, benign  . Tonsillectomy    . Appendectomy    . Cardiac catheterization    . Fracture surgery      nose  . Tubal ligation    . Orif ankle fracture Left 10/23/2013    Procedure: OPEN REDUCTION INTERNAL FIXATION (ORIF) LEFT ANKLE FRACTURE;  Surgeon: Marianna Payment, MD;  Location: [place];  Service: Orthopedics;  Laterality: Left;  . Cataract extraction Bilateral 2016  . Partial mastectomy with needle localization and axillary sentinel lymph node bx Left 05/06/2015    Procedure: LEFT BREAST PARTIAL MASTECTOMY WITH NEEDLE LOCALIZATION AND SENTINEL LYMPH NODE MAPPING;  Surgeon: [name], MD;  Location: [place];  Service: General;  Laterality: Left;  . Nipple sparing mastectomy Left 08/06/2015    Procedure: LEFT NIPPLE SPARING MASTECTOMY;  Surgeon: [name], MD;  Location: [place];  Service: General;  Laterality: Left;  . Breast reconstruction with placement of tissue expander and flex hd (acellular hydrated dermis) Left 08/06/2015    Procedure: LEFT BREAST RECONSTRUCTION WITH PLACEMENT OF SALINE IMPLANT AND ACELLULAR DURMAL MATRIX;  Surgeon: Crissie Reese, MD;  Location: [place];  Service: Plastics;  Laterality: Left;   Family History  Problem Relation Age of Onset  . Cancer Father     stomach   . Heart  disease Son     MI  . Arthritis Mother   . Diabetes Mother   . Colon cancer Neg Hx   . Pancreatic cancer Neg Hx   . Stomach cancer Neg Hx   . Breast cancer Neg Hx    Social History  Substance Use Topics  . Smoking status: Former Research scientist (life sciences)  . Smokeless tobacco: Former Systems developer  . Alcohol Use: No     Comment: social   OB History    No data available     Review of Systems A 10 point review of systems was completed and was negative except for pertinent positives and negatives as mentioned in the history of present illness     Allergies  Ace inhibitors; Angiotensin receptor blockers; Niacin; and Red dye  Home Medications    Prior to Admission medications   Medication Sig Start Date End Date Taking? Authorizing Provider  amLODipine (NORVASC) 5 MG tablet Take 0.5 tablets (2.5 mg total) by mouth daily. 10/06/15  Yes [name], MD  aspirin EC 81 MG tablet Take 81 mg by mouth daily.   Yes Historical Provider, MD  Aspirin-Salicylamide-Caffeine (BC HEADACHE POWDER PO) Take 1 Package by mouth daily as needed (pain).    Yes Historical Provider, MD  COMBIGAN 0.2-0.5 % ophthalmic solution PLACE 1 DROP INTO BOTH EYES TWICE A DAY 09/08/15  Yes Historical Provider, MD  fluconazole (DIFLUCAN) 150 MG tablet Take weekly if needed Patient taking differently: Take 150 mg by mouth daily as needed (yeast infection). Take weekly if needed 10/06/15  Yes [name], MD  furosemide (LASIX) 20 MG tablet TAKE 1 TABLET (20 MG TOTAL) BY MOUTH DAILY. 07/08/15  Yes [name], MD  glipiZIDE (GLUCOTROL) 5 MG tablet Take 0.5 tablets (2.5 mg total) by mouth daily before breakfast. 10/01/15  Yes [name], MD  Hypromellose (GENTEAL OP) Apply 1 application to eye at bedtime.   Yes Historical Provider, MD  levothyroxine (SYNTHROID, LEVOTHROID) 100 MCG tablet TAKE ONE TABLET BY MOUTH DAILY EXCEPT ON MONDAY AND WEDNESDAY TAKE 1/2 TABLET 08/27/15  Yes [name], MD  magnesium oxide (MAG-OX) 400 (241.3 MG) MG tablet TAKE 1 TABLET (400 MG TOTAL) BY MOUTH DAILY. 04/06/15  Yes [name], MD  nitroGLYCERIN (NITROSTAT) 0.4 MG SL tablet Place 0.4 mg under the tongue every 5 (five) minutes as needed for chest pain.  09/28/15  Yes Historical Provider, MD  nystatin (MYCOSTATIN) 100000 UNIT/ML suspension TAKE 5 MLS (500,000 UNITS TOTAL) BY MOUTH 4 (FOUR) TIMES DAILY. SWISH AND SWALLOW. 09/08/15  Yes Historical Provider, MD  omeprazole (PRILOSEC OTC) 20 MG tablet Take 20 mg by mouth daily.   Yes Historical Provider, MD  Polyethyl Glycol-Propyl Glycol (SYSTANE OP) Place 1 drop into both eyes at bedtime.    Yes Historical Provider, MD   tamoxifen (NOLVADEX) 20 MG tablet Take 1 tablet (20 mg total) by mouth daily. 08/25/15  Yes Laurie Panda, NP  traMADol (ULTRAM) 50 MG tablet Take 50 mg by mouth every 6 (six) hours as needed for moderate pain.  09/15/15  Yes Historical Provider, MD  VOLTAREN 1 % GEL Apply 2 g topically 2 (two) times daily as needed (pain).  09/17/15  Yes Historical Provider, MD  cephALEXin (KEFLEX) 500 MG capsule Take 1 capsule (500 mg total) by mouth 4 (four) times daily. 11/10/15   Comer Locket, PA-C  fluocinonide cream (LIDEX) 0.05 % APPLY TOPICALLY DAILY AS NEEDED 08/13/14   [name], MD  ondansetron (  ZOFRAN) 4 MG tablet Take 1 tablet (4 mg total) by mouth every 6 (six) hours. 11/10/15   Comer Locket, PA-C  Probiotic Product (ADVANCED PROBIOTIC) CAPS Take 1 capsule by mouth every morning. 11/10/15   Asher Torpey, PA-C   BP 122/79 mmHg  Pulse 84  Temp(Src) 98.6 F (37 C) (Oral)  Resp 16  Ht 5\' 3"  (1.6 m)  Wt 89.812 kg  BMI 35.08 kg/m2  SpO2 100% Physical Exam  Constitutional: She is oriented to person, place, and time. She appears well-developed and well-nourished.  Well appearing African-American female  HENT:  Head: Normocephalic and atraumatic.  Mouth/Throat: Oropharynx is clear and moist.  Eyes: Conjunctivae are normal. Pupils are equal, round, and reactive to light. Right eye exhibits no discharge. Left eye exhibits no discharge. No scleral icterus.  Neck: Neck supple.  Cardiovascular: Normal rate, regular rhythm and normal heart sounds.   Pulmonary/Chest: Effort normal and breath sounds normal. No respiratory distress. She has no wheezes. She has no rales.  Abdominal: Soft. There is no tenderness.  Musculoskeletal: She exhibits no tenderness.  Neurological: She is alert and oriented to person, place, and time.  Cranial Nerves II-XII grossly intact  Skin: Skin is warm and dry. No rash noted.  Psychiatric: She has a normal mood and affect.  Nursing note and vitals  reviewed.   ED Course  Procedures (including critical care time) Labs Review Labs Reviewed  COMPREHENSIVE METABOLIC PANEL - Abnormal; Notable for the following:    Potassium 3.1 (*)    CO2 21 (*)    Glucose, Bld 163 (*)    Creatinine, Ser 1.13 (*)    Calcium 8.8 (*)    Albumin 3.4 (*)    AST 49 (*)    GFR calc non Af Amer 47 (*)    GFR calc Af Amer 54 (*)    All other components within normal limits  URINALYSIS, ROUTINE W REFLEX MICROSCOPIC (NOT AT [facility]) - Abnormal; Notable for the following:    APPearance TURBID (*)    Ketones, ur 15 (*)    Leukocytes, UA SMALL (*)    All other components within normal limits  URINE MICROSCOPIC-ADD ON - Abnormal; Notable for the following:    Squamous Epithelial / LPF 6-30 (*)    Bacteria, UA MANY (*)    All other components within normal limits  LIPASE, BLOOD  CBC    Imaging Review No results found. I have personally reviewed and evaluated these images and lab results as part of my medical decision-making.   EKG Interpretation None     Meds given in ED:  Medications  ondansetron (ZOFRAN-ODT) disintegrating tablet 4 mg (4 mg Oral Given 11/10/15 0321)  oxyCODONE-acetaminophen (PERCOCET/ROXICET) 5-325 MG per tablet 1 tablet (1 tablet Oral Given 11/10/15 0549)  ondansetron (ZOFRAN-ODT) disintegrating tablet 4 mg (4 mg Oral Given 11/10/15 0549)    Discharge Medication List as of 11/10/2015  6:57 AM    START taking these medications   Details  cephALEXin (KEFLEX) 500 MG capsule Take 1 capsule (500 mg total) by mouth 4 (four) times daily., Starting 11/10/2015, Until Discontinued, Print    ondansetron (ZOFRAN) 4 MG tablet Take 1 tablet (4 mg total) by mouth every 6 (six) hours., Starting 11/10/2015, Until Discontinued, Print    Probiotic Product (ADVANCED PROBIOTIC) CAPS Take 1 capsule by mouth every morning., Starting 11/10/2015, Until Discontinued, Print       Filed Vitals:   11/10/15 0500 11/10/15 0530 11/10/15 0600 11/10/15  0630  BP: 123/72 127/77 118/72 122/79  Pulse: 84 90 86 84  Temp:      TempSrc:      Resp: 18  17 16   Height:      Weight:      SpO2: 99% 99% 100% 100%    MDM  Tylia L Bozich is a 74 y.o. female who comes in for evaluation of N/V/D and mild abdominal discomfort after eating a tomato sandwich. On arrival, patient is hemodynamically stable with normal vital signs and is afebrile. No N/V/D in the ED. Abdominal exam is not concerning and has no focal tenderness. Urinalysis shows evidence of UTI with small leukocytes, 6-30 white cells and many bacteria, possible contamination with 6-30 epithelials. However, given patient's symptoms, we'll treat for UTI with possible early pyelonephritis. Urine culture ordered. Given prescription for Keflex, Zofran and a probiotic. Patient is tolerating oral fluids in the ED. Overall she appears well, nontoxic, hemodynamically stable and appropriate for discharge. Patient given strict return precautions including, but not limited to, fevers or inability to tolerate oral fluids, worsening abdominal pain. Patient verbalizes understanding and agrees with this plan.  The patient appears reasonably screened and/or stabilized for discharge and I doubt any other medical condition or other [facility] requiring further screening, evaluation, or treatment in the ED at this time prior to discharge.   Final diagnoses:  UTI (lower urinary tract infection)        Comer Locket, PA-C 11/10/15 1039  April Palumbo, MD 11/10/15 2334

## 2015-11-11 LAB — URINE CULTURE

## 2015-11-12 ENCOUNTER — Telehealth: Payer: Self-pay | Admitting: Family Medicine

## 2015-11-12 ENCOUNTER — Ambulatory Visit (INDEPENDENT_AMBULATORY_CARE_PROVIDER_SITE_OTHER): Payer: Commercial Managed Care - HMO | Admitting: Internal Medicine

## 2015-11-12 ENCOUNTER — Encounter: Payer: Self-pay | Admitting: Internal Medicine

## 2015-11-12 VITALS — BP 118/70 | HR 90 | Temp 97.6°F | Wt 183.0 lb

## 2015-11-12 DIAGNOSIS — A09 Infectious gastroenteritis and colitis, unspecified: Secondary | ICD-10-CM | POA: Diagnosis not present

## 2015-11-12 DIAGNOSIS — R197 Diarrhea, unspecified: Secondary | ICD-10-CM | POA: Insufficient documentation

## 2015-11-12 LAB — HM DIABETES EYE EXAM

## 2015-11-12 NOTE — Assessment & Plan Note (Signed)
By history, seems to be gastroenteritis Started on keflex but no dysuria, urinalysis not remarkable and culture basically negative----will stop antibiotic Liver tests all normal---doubt sig pathology despite tenderness there Would consider ultrasound if tenderness and diarrhea persist Keflex may be causing or exacerbating the diarrhea Discussed the tamoxifen--given time course (on for 2-3 months)--doubt this is implicated Would keep up with fluids Consider other testing if not better by next week

## 2015-11-12 NOTE — Patient Instructions (Signed)
Please stop the cephalexin. Keep up with lots of fluids Let Dr Damita Dunnings know if you are not feeling better by Monday.

## 2015-11-12 NOTE — Telephone Encounter (Signed)
Will evaluate at [place] does have ~7% incidence of diarrhea per prescribing info

## 2015-11-12 NOTE — Progress Notes (Signed)
Subjective:    Patient ID: Kelly Kennedy, female    DOB: 04-19-1941, 74 y.o.   MRN: [id number]  HPI Here due trouble with diarrhea Started with vomiting 3 mornings ago Thought she might have had food poisoning (got sick soon after eating tomato) Diarrhea started 2 nights ago Had pain all over at the start of the symptoms---this is better No abdominal pain now Loose stools -- probably over 20 times yesterday No blood in stool  Went to ER 2 days ago--Rx with keflex ---diarrhea started after this Given ondansetron and started probiotic 4x/day Fever once to 101 after this visit  Current Outpatient Prescriptions on File Prior to Visit  Medication Sig Dispense Refill  . amLODipine (NORVASC) 5 MG tablet Take 0.5 tablets (2.5 mg total) by mouth daily.    Marland Kitchen aspirin EC 81 MG tablet Take 81 mg by mouth daily.    . Aspirin-Salicylamide-Caffeine (BC HEADACHE POWDER PO) Take 1 Package by mouth daily as needed (pain).     . cephALEXin (KEFLEX) 500 MG capsule Take 1 capsule (500 mg total) by mouth 4 (four) times daily. 40 capsule 0  . COMBIGAN 0.2-0.5 % ophthalmic solution PLACE 1 DROP INTO BOTH EYES TWICE A DAY  2  . fluconazole (DIFLUCAN) 150 MG tablet Take weekly if needed (Patient taking differently: Take 150 mg by mouth daily as needed (yeast infection). Take weekly if needed) 4 tablet 3  . fluocinonide cream (LIDEX) 0.05 % APPLY TOPICALLY DAILY AS NEEDED 30 g 5  . furosemide (LASIX) 20 MG tablet TAKE 1 TABLET (20 MG TOTAL) BY MOUTH DAILY. 90 tablet 2  . glipiZIDE (GLUCOTROL) 5 MG tablet Take 0.5 tablets (2.5 mg total) by mouth daily before breakfast. 60 tablet 3  . Hypromellose (GENTEAL OP) Apply 1 application to eye at bedtime.    Marland Kitchen levothyroxine (SYNTHROID, LEVOTHROID) 100 MCG tablet TAKE ONE TABLET BY MOUTH DAILY EXCEPT ON MONDAY AND WEDNESDAY TAKE 1/2 TABLET 90 tablet 3  . magnesium oxide (MAG-OX) 400 (241.3 MG) MG tablet TAKE 1 TABLET (400 MG TOTAL) BY MOUTH DAILY. 30 tablet 11  .  nitroGLYCERIN (NITROSTAT) 0.4 MG SL tablet Place 0.4 mg under the tongue every 5 (five) minutes as needed for chest pain.     Marland Kitchen nystatin (MYCOSTATIN) 100000 UNIT/ML suspension TAKE 5 MLS (500,000 UNITS TOTAL) BY MOUTH 4 (FOUR) TIMES DAILY. SWISH AND SWALLOW.  1  . omeprazole (PRILOSEC OTC) 20 MG tablet Take 20 mg by mouth daily.    . ondansetron (ZOFRAN) 4 MG tablet Take 1 tablet (4 mg total) by mouth every 6 (six) hours. 12 tablet 0  . Polyethyl Glycol-Propyl Glycol (SYSTANE OP) Place 1 drop into both eyes at bedtime.     . Probiotic Product (ADVANCED PROBIOTIC) CAPS Take 1 capsule by mouth every morning. 20 capsule 0  . tamoxifen (NOLVADEX) 20 MG tablet Take 1 tablet (20 mg total) by mouth daily. 30 tablet 6  . traMADol (ULTRAM) 50 MG tablet Take 50 mg by mouth every 6 (six) hours as needed for moderate pain.     Marland Kitchen VOLTAREN 1 % GEL Apply 2 g topically 2 (two) times daily as needed (pain).      No current facility-administered medications on file prior to visit.    Allergies  Allergen Reactions  . Ace Inhibitors Other (See Comments)    H/o lip swelling with change in losartan pill  . Angiotensin Receptor Blockers Other (See Comments)    H/o lip swelling with change in  losartan pill  . Niacin Rash  . Red Dye Rash    Past Medical History  Diagnosis Date  . Hyperlipidemia   . Diverticulosis of colon 06/2005  . Hypothyroid   . Insomnia   . Menopausal symptoms   . Hypertension   . Hyperglycemia   . adenomatous Colon polyps     colonoscopy 8/99, 12/02, 8/06  . Pericarditis 1980's  . Chest pain     uses NTG as needed  . Depression   . Bell's palsy   . GERD (gastroesophageal reflux disease)   . H/O hiatal hernia   . Headache(784.0)   . Arthritis   . Irritable bowel   . Diabetes mellitus without complication ([place])   . TIA (transient ischemic attack)     09/2014  . Ductal carcinoma in situ (DCIS) of left breast   . Cancer (HCC)     DCIS left breast  . Family history of adverse  reaction to anesthesia     " my mother didn't wake up so they had to put her on life support for about an hour or more; my sisiter has problems waking up too from anesthesia.  . Stroke [facility])     Past Surgical History  Procedure Laterality Date  . Partial hysterectomy      ovaries intact  . Cholecystectomy  1990  . Ectopic pregnancy surgery  1971  . Cystectomy  05/04/08    left lower arm, benign  . Tonsillectomy    . Appendectomy    . Cardiac catheterization    . Fracture surgery      nose  . Tubal ligation    . Orif ankle fracture Left 10/23/2013    Procedure: OPEN REDUCTION INTERNAL FIXATION (ORIF) LEFT ANKLE FRACTURE;  Surgeon: Marianna Payment, MD;  Location: [place];  Service: Orthopedics;  Laterality: Left;  . Cataract extraction Bilateral 2016  . Partial mastectomy with needle localization and axillary sentinel lymph node bx Left 05/06/2015    Procedure: LEFT BREAST PARTIAL MASTECTOMY WITH NEEDLE LOCALIZATION AND SENTINEL LYMPH NODE MAPPING;  Surgeon: [name], MD;  Location: [place];  Service: General;  Laterality: Left;  . Nipple sparing mastectomy Left 08/06/2015    Procedure: LEFT NIPPLE SPARING MASTECTOMY;  Surgeon: [name], MD;  Location: [place];  Service: General;  Laterality: Left;  . Breast reconstruction with placement of tissue expander and flex hd (acellular hydrated dermis) Left 08/06/2015    Procedure: LEFT BREAST RECONSTRUCTION WITH PLACEMENT OF SALINE IMPLANT AND ACELLULAR DURMAL MATRIX;  Surgeon: Crissie Reese, MD;  Location: [place];  Service: Plastics;  Laterality: Left;    Family History  Problem Relation Age of Onset  . Cancer Father     stomach   . Heart disease Son     MI  . Arthritis Mother   . Diabetes Mother   . Colon cancer Neg Hx   . Pancreatic cancer Neg Hx   . Stomach cancer Neg Hx   . Breast cancer Neg Hx     Social History   Social History  . Marital Status: Widowed    Spouse Name: [name]  . Number of Children: 3    . Years of Education: [name]   Occupational History  . retired, previous Engineer, manufacturing systems    Social History Main Topics  . Smoking status: Former Research scientist (life sciences)  . Smokeless tobacco: Former Systems developer  . Alcohol Use: No     Comment: social  . Drug Use: No  . Sexual  Activity: No   Other Topics Concern  . Not on file   Social History Narrative   From [place].   3 children, one died from MI in 03-19-05   Married 03-20-1959   Husband and brother died in the same week in 18-Nov-2012         Patient is right handed.   Patient has hs education.   Patient drinks 1 cup of soda daily, and green tea.   Review of Systems Some headache now Only tolerating oatmeal Drinking ginger ale and gatorade    Objective:   Physical Exam  Constitutional: She appears well-developed and well-nourished. No distress.  Neck: Normal range of motion. Neck supple. No thyromegaly present.  Pulmonary/Chest: Effort normal and breath sounds normal. No respiratory distress. She has no wheezes. She has no rales.  Abdominal: Soft. Bowel sounds are normal. She exhibits no distension. There is no rebound and no guarding.  Moderate RUQ tenderness---mild generalized tenderness other places  Lymphadenopathy:    She has no cervical adenopathy.          Assessment & Plan:

## 2015-11-12 NOTE — Progress Notes (Signed)
Pre visit review using our clinic review tool, if applicable. No additional management support is needed unless otherwise documented below in the visit note. 

## 2015-11-12 NOTE — Telephone Encounter (Signed)
Spoke with pt and she is trying to drink plenty of liquid but with 20 + diarrhea in last 24 hrs pt decided to come in for appt 11/12/15 at 2:15 pm with Dr Silvio Pate.

## 2015-11-12 NOTE — Telephone Encounter (Signed)
Patient Name: Kelly Kennedy DOB: 06-26-1941 Initial Comment Caller states he went to the ER for Diarrhea, and vomiting. Urinating. Nurse Assessment Nurse: Vallery Sa, RN, Cathy Date/Time (Eastern Time): 11/12/2015 11:16:34 AM Confirm and document reason for call. If symptomatic, describe symptoms. ---Caller states she developed vomiting and diarrhea about 3 days ago. (No vomiting, "20 something": episodes of diarrhea in the past 24 hours.) She developed fever 3 days ago (estimates her current temperature to be about 101.0). Alert and responsive. Has the patient traveled out of the country within the last 30 days? ---No Does the patient have any new or worsening symptoms? ---Yes Will a triage be completed? ---Yes Related visit to physician within the last 2 weeks? ---Yes Does the PT have any chronic conditions? (i.e. diabetes, asthma, etc.) ---Yes List chronic conditions. ---High Blood Pressure, Diabetes, Acid Reflux, Seen in the ER 2 days ago for Food poisoning Is this a behavioral health or substance abuse call? ---No Guidelines Guideline Title Affirmed Question Affirmed Notes Diarrhea [1] SEVERE diarrhea (e.g., 7 or more times / day more than normal) AND [2] age > 60 years Cancer - Diarrhea [1] SEVERE diarrhea (e.g., 7 or more times / day more than normal) AND [2] receiving chemotherapy or radiation therapy (within past 4 weeks) Final Disposition User See Physician within 4 Hours (or PCP triage) Vallery Sa, RN, [place] Comments After triage completed, the caller shared that she doen't want to go to the office because she is concerned she will lose control of her bowel. She shares that she was diagnosed with Breast Cancer and started Tamoxifen in September 2016. She thinks her diarrhea may be related to the medication. She asks if the MD can call in something for her. Called the office and notified Reena who will obtain further direction from MD. Referrals REFERRED TO PCP  OFFICE

## 2015-11-28 ENCOUNTER — Other Ambulatory Visit: Payer: Self-pay | Admitting: Family Medicine

## 2015-12-01 NOTE — Telephone Encounter (Signed)
Electronic refill request. Last Filled:    30 g 5 08/13/2014  Upcoming appt scheduled.

## 2015-12-02 ENCOUNTER — Encounter: Payer: Self-pay | Admitting: Cardiovascular Disease

## 2015-12-02 ENCOUNTER — Ambulatory Visit (INDEPENDENT_AMBULATORY_CARE_PROVIDER_SITE_OTHER): Payer: Commercial Managed Care - HMO | Admitting: Cardiovascular Disease

## 2015-12-02 VITALS — BP 126/86 | HR 88 | Resp 16 | Ht 63.0 in | Wt 185.1 lb

## 2015-12-02 DIAGNOSIS — E785 Hyperlipidemia, unspecified: Secondary | ICD-10-CM

## 2015-12-02 DIAGNOSIS — I10 Essential (primary) hypertension: Secondary | ICD-10-CM | POA: Diagnosis not present

## 2015-12-02 NOTE — Telephone Encounter (Signed)
Sent. Thanks.   

## 2015-12-02 NOTE — Progress Notes (Signed)
Patient ID: Kelly Kennedy, female   DOB: [birth year], 75 y.o.   MRN: [id number]    Cardiology Office Note    Date:  12/05/2015   ID:  Kelly Kennedy, Kelly Kennedy October 26, 1941, MRN [id number]  PCP:  [name], MD  Cardiologist:   Sanda Klein, MD   Chief Complaint  Patient presents with  . Annual Exam     no chest pain, occassional shortness of breath, ocassional edema, no pain in legs, no cramping in legs, occassional lightheadedness and dizziness    History of Present Illness:  Kelly Kennedy is a 75 y.o. female who returns in f/u for HTN, HLP and questionable TIA.  She has not had further neurological events. A couple of times earlier this year she had a headache and couldn't get out of bed. EEG showed some equivocal left brain slow and sharp transients, but her previous focal paresthesias were in the left upper and lower extremities. She had a left mastectomy for cancer this year. She may need a right breast procedure.  She has a past medical history significant for possible endocarditis in the 1980s (may be mis-charting of pericarditis), HTN, hyperlipidemia, hyperglycemia (A1c 6.9% suggests full blown DM, diet controlled) Described palpitations (heart "turning over"). Simultaneously, has had headaches and an episode of transient left hemianesthesia/paresthesia. MRI/MRA showed no evidence of major arterial problems, did show chronic small vessel ischemic disease. In November 2015, following a questionable TIA, she had a normal nuclear stress test, EF 62% and a normal 30 day event monitor. Her son had sudden death at age 37 - autopsy -"massive heart attack". No other family history of premature cardiac or vascular illness. Her ECG is abnormal; ST depression and T wave inversion inferolaterally, borderline long QT.    Past Medical History  Diagnosis Date  . Hyperlipidemia   . Diverticulosis of colon 06/2005  . Hypothyroid   . Insomnia   . Menopausal symptoms   . Hypertension   .  Hyperglycemia   . adenomatous Colon polyps     colonoscopy 8/99, 12/02, 8/06  . Pericarditis 1980's  . Chest pain     uses NTG as needed  . Depression   . Bell's palsy   . GERD (gastroesophageal reflux disease)   . H/O hiatal hernia   . Headache(784.0)   . Arthritis   . Irritable bowel   . Diabetes mellitus without complication ([place])   . TIA (transient ischemic attack)     09/2014  . Ductal carcinoma in situ (DCIS) of left breast   . Cancer (HCC)     DCIS left breast  . Family history of adverse reaction to anesthesia     " my mother didn't wake up so they had to put her on life support for about an hour or more; my sisiter has problems waking up too from anesthesia.  . Stroke [facility])     Past Surgical History  Procedure Laterality Date  . Partial hysterectomy      ovaries intact  . Cholecystectomy  1990  . Ectopic pregnancy surgery  1971  . Cystectomy  05/04/08    left lower arm, benign  . Tonsillectomy    . Appendectomy    . Cardiac catheterization    . Fracture surgery      nose  . Tubal ligation    . Orif ankle fracture Left 10/23/2013    Procedure: OPEN REDUCTION INTERNAL FIXATION (ORIF) LEFT ANKLE FRACTURE;  Surgeon: Marianna Payment, MD;  Location: [facility];  Service: Orthopedics;  Laterality: Left;  . Cataract extraction Bilateral 2016  . Partial mastectomy with needle localization and axillary sentinel lymph node bx Left 05/06/2015    Procedure: LEFT BREAST PARTIAL MASTECTOMY WITH NEEDLE LOCALIZATION AND SENTINEL LYMPH NODE MAPPING;  Surgeon: [name], MD;  Location: [facility];  Service: General;  Laterality: Left;  . Nipple sparing mastectomy Left 08/06/2015    Procedure: LEFT NIPPLE SPARING MASTECTOMY;  Surgeon: [name], MD;  Location: [facility];  Service: General;  Laterality: Left;  . Breast reconstruction with placement of tissue expander and flex hd (acellular hydrated dermis) Left 08/06/2015    Procedure: LEFT BREAST RECONSTRUCTION WITH  PLACEMENT OF SALINE IMPLANT AND ACELLULAR DURMAL MATRIX;  Surgeon: Crissie Reese, MD;  Location: [facility];  Service: Plastics;  Laterality: Left;    Current Outpatient Prescriptions  Medication Sig Dispense Refill  . amLODipine (NORVASC) 5 MG tablet Take 0.5 tablets (2.5 mg total) by mouth daily.    Marland Kitchen aspirin EC 81 MG tablet Take 81 mg by mouth daily.    . Aspirin-Salicylamide-Caffeine (BC HEADACHE POWDER PO) Take 1 Package by mouth daily as needed (pain).     . COMBIGAN 0.2-0.5 % ophthalmic solution PLACE 1 DROP INTO BOTH EYES TWICE A DAY  2  . fluconazole (DIFLUCAN) 150 MG tablet Take weekly if needed (Patient taking differently: Take 150 mg by mouth daily as needed (yeast infection). Take weekly if needed) 4 tablet 3  . furosemide (LASIX) 20 MG tablet TAKE 1 TABLET (20 MG TOTAL) BY MOUTH DAILY. 90 tablet 2  . glipiZIDE (GLUCOTROL) 5 MG tablet Take 0.5 tablets (2.5 mg total) by mouth daily before breakfast. 60 tablet 3  . Hypromellose (GENTEAL OP) Apply 1 application to eye at bedtime.    Marland Kitchen levothyroxine (SYNTHROID, LEVOTHROID) 100 MCG tablet TAKE ONE TABLET BY MOUTH DAILY EXCEPT ON MONDAY AND WEDNESDAY TAKE 1/2 TABLET 90 tablet 3  . magnesium oxide (MAG-OX) 400 (241.3 MG) MG tablet TAKE 1 TABLET (400 MG TOTAL) BY MOUTH DAILY. 30 tablet 11  . nitroGLYCERIN (NITROSTAT) 0.4 MG SL tablet Place 0.4 mg under the tongue every 5 (five) minutes as needed for chest pain.     Marland Kitchen omeprazole (PRILOSEC OTC) 20 MG tablet Take 20 mg by mouth daily.    Vladimir Faster Glycol-Propyl Glycol (SYSTANE OP) Place 1 drop into both eyes at bedtime.     . Probiotic Product (ADVANCED PROBIOTIC) CAPS Take 1 capsule by mouth every morning. 20 capsule 0  . tamoxifen (NOLVADEX) 20 MG tablet Take 1 tablet (20 mg total) by mouth daily. 30 tablet 6  . traMADol (ULTRAM) 50 MG tablet Take 50 mg by mouth every 6 (six) hours as needed for moderate pain.     Marland Kitchen VOLTAREN 1 % GEL Apply 2 g topically 2 (two) times daily as needed (pain).       Marland Kitchen amitriptyline (ELAVIL) 50 MG tablet Take 1 tablet by mouth at bedtime. Take 1-2 tabs daily at bedtime  1  . fluocinonide cream (LIDEX) 0.05 % APPLY TOPICALLY DAILY AS NEEDED 30 g 3   No current facility-administered medications for this visit.    Allergies:   Ace inhibitors; Angiotensin receptor blockers; Niacin; and Red dye   Social History   Social History  . Marital Status: Widowed    Spouse Name: [name]  . Number of Children: 3  . Years of Education: [name]   Occupational History  . retired, previous Engineer, manufacturing systems    Social History  Main Topics  . Smoking status: Former Research scientist (life sciences)  . Smokeless tobacco: Former Systems developer  . Alcohol Use: No     Comment: social  . Drug Use: No  . Sexual Activity: No   Other Topics Concern  . None   Social History Narrative   From [place].   3 children, one died from MI in March 12, 2005   Married Mar 13, 1959   Husband and brother died in the same week in November 11, 2012         Patient is right handed.   Patient has hs education.   Patient drinks 1 cup of soda daily, and green tea.     Family History:  The patient's family history includes Arthritis in her mother; Cancer in her father; Diabetes in her mother; Heart disease in her son. There is no history of Colon cancer, Pancreatic cancer, Stomach cancer, or Breast cancer.   ROS:   Please see the history of present illness.    ROS All other systems reviewed and are negative.   PHYSICAL EXAM:   VS:  BP 126/86 mmHg  Pulse 88  Resp 16  Ht 5\' 3"  (1.6 m)  Wt 185 lb 1 oz (83.944 kg)  BMI 32.79 kg/m2   GEN: Well nourished, well developed, in no acute distress HEENT: normal Neck: no JVD, carotid bruits, or masses Cardiac: RRR; no murmurs, rubs, or gallops,no edema  Respiratory:  clear to auscultation bilaterally, normal work of breathing GI: soft, nontender, nondistended, + BS MS: no deformity or atrophy Skin: warm and dry, no rash Neuro:  Alert and Oriented x 3, Strength and sensation are intact Psych:  euthymic mood, full affect  Wt Readings from Last 3 Encounters:  12/02/15 185 lb 1 oz (83.944 kg)  11/12/15 183 lb (83.008 kg)  11/10/15 198 lb (89.812 kg)      Studies/Labs Reviewed:   EKG:  EKG is ordered today.  The ekg ordered today demonstrates NSR, old nonspecific T wave abnormalities, QTc 442 ms  Recent Labs: 11/10/2015: ALT 32; BUN 11; Creatinine, Ser 1.13*; Hemoglobin 12.8; Platelets 343; Potassium 3.1*; Sodium 136   Lipid Panel    Component Value Date/Time   CHOL 120 09/29/2014 0538   TRIG 110 09/29/2014 0538   TRIG 203 10/03/2012   TRIG 144 12/11/2006 1253   HDL 56 09/29/2014 0538   CHOLHDL 2.1 09/29/2014 0538   CHOLHDL 3.6 CALC 12/11/2006 1253   VLDL 22 09/29/2014 0538   LDLCALC 42 09/29/2014 0538   LDLDIRECT 70.0 06/27/2014 0754     ASSESSMENT:    1. Essential hypertension   2. HLD (hyperlipidemia)      PLAN:  In order of problems listed above:  1. Good BP control. No edema. I suggested that she try to gradually wean off the loop diuretic. It appears to be unnecessary. 2. Excellent lipid profile, but her list no longer has a statin on it. Not clear if this was truly stopped? In November, Dr. Damita Dunnings decreased the dose in half for headache. Would recheck lipids if it has been stopped.   Medication Adjustments/Labs and Tests Ordered: Current medicines are reviewed at length with the patient today.  Concerns regarding medicines are outlined above.  Medication changes, Labs and Tests ordered today are listed below. Patient Instructions  Dr Sallyanne Kuster has made no changes today in your current medications or treatment plan.  Your physician recommends that you schedule a follow-up appointment in 12 months. You will receive a reminder letter in the mail two months in  advance. If you don't receive a letter, please call our office to schedule the follow-up appointment.  If you need a refill on your cardiac medications before your next appointment, please call  your pharmacy.     Mikael Spray, MD  12/05/2015 8:58 AM    [facility] [address], [place], [place]  [address] Phone: [phone]; Fax: [phone]

## 2015-12-02 NOTE — Patient Instructions (Signed)
Dr Sallyanne Kuster has made no changes today in your current medications or treatment plan.  Your physician recommends that you schedule a follow-up appointment in 12 months. You will receive a reminder letter in the mail two months in advance. If you don't receive a letter, please call our office to schedule the follow-up appointment.  If you need a refill on your cardiac medications before your next appointment, please call your pharmacy.

## 2015-12-05 ENCOUNTER — Encounter: Payer: Self-pay | Admitting: Cardiovascular Disease

## 2015-12-06 ENCOUNTER — Telehealth: Payer: Self-pay | Admitting: Family Medicine

## 2015-12-06 NOTE — Telephone Encounter (Signed)
Cardiology was asking about patient being on a statin. Was she able to tolerate the 1/2 dose of crestor w/o a headache? Let me know.  Thanks.

## 2015-12-07 ENCOUNTER — Other Ambulatory Visit: Payer: Self-pay | Admitting: *Deleted

## 2015-12-07 ENCOUNTER — Telehealth: Payer: Self-pay | Admitting: *Deleted

## 2015-12-07 DIAGNOSIS — C50919 Malignant neoplasm of unspecified site of unspecified female breast: Secondary | ICD-10-CM

## 2015-12-07 NOTE — Telephone Encounter (Signed)
-----   Message from Sanda Klein, MD sent at 12/05/2015  9:10 AM EST ----- Could you please clarify if she is still on Crestor or other statin? And whether she had a lipid profile in last 12 months? If she has not, please order a lipid profile.

## 2015-12-07 NOTE — Telephone Encounter (Signed)
Patient says it gave her a rash and headache.  Patient has appt on 12/08/15.  Will discuss at that time.

## 2015-12-07 NOTE — Telephone Encounter (Signed)
Patient states she has not taken a statin drug in some.  States they cause her to break out.  Has not had a recent Lipid Profile done.  Sees Dr. Damita Dunnings tomorrow and plans on going fasting.  Patient requested I contact Dr. Damita Dunnings and request blood work be drawn during her visit in AM.  Message routed to Dr. Damita Dunnings.

## 2015-12-08 ENCOUNTER — Other Ambulatory Visit (HOSPITAL_BASED_OUTPATIENT_CLINIC_OR_DEPARTMENT_OTHER): Payer: Commercial Managed Care - HMO

## 2015-12-08 ENCOUNTER — Ambulatory Visit (INDEPENDENT_AMBULATORY_CARE_PROVIDER_SITE_OTHER): Payer: Commercial Managed Care - HMO | Admitting: Family Medicine

## 2015-12-08 ENCOUNTER — Encounter: Payer: Self-pay | Admitting: Family Medicine

## 2015-12-08 ENCOUNTER — Ambulatory Visit (HOSPITAL_BASED_OUTPATIENT_CLINIC_OR_DEPARTMENT_OTHER): Payer: Commercial Managed Care - HMO | Admitting: Oncology

## 2015-12-08 VITALS — BP 166/75 | HR 90 | Temp 97.8°F | Resp 19 | Ht 63.0 in | Wt 186.3 lb

## 2015-12-08 VITALS — BP 122/70 | HR 76 | Temp 97.7°F | Ht 63.0 in | Wt 189.8 lb

## 2015-12-08 DIAGNOSIS — E1159 Type 2 diabetes mellitus with other circulatory complications: Secondary | ICD-10-CM

## 2015-12-08 DIAGNOSIS — Z0000 Encounter for general adult medical examination without abnormal findings: Secondary | ICD-10-CM

## 2015-12-08 DIAGNOSIS — I10 Essential (primary) hypertension: Secondary | ICD-10-CM

## 2015-12-08 DIAGNOSIS — E785 Hyperlipidemia, unspecified: Secondary | ICD-10-CM | POA: Diagnosis not present

## 2015-12-08 DIAGNOSIS — N760 Acute vaginitis: Secondary | ICD-10-CM

## 2015-12-08 DIAGNOSIS — R609 Edema, unspecified: Secondary | ICD-10-CM

## 2015-12-08 DIAGNOSIS — C50919 Malignant neoplasm of unspecified site of unspecified female breast: Secondary | ICD-10-CM | POA: Diagnosis not present

## 2015-12-08 DIAGNOSIS — C50412 Malignant neoplasm of upper-outer quadrant of left female breast: Secondary | ICD-10-CM

## 2015-12-08 DIAGNOSIS — E119 Type 2 diabetes mellitus without complications: Secondary | ICD-10-CM | POA: Diagnosis not present

## 2015-12-08 DIAGNOSIS — Z170 Estrogen receptor positive status [ER+]: Secondary | ICD-10-CM

## 2015-12-08 DIAGNOSIS — Z7189 Other specified counseling: Secondary | ICD-10-CM

## 2015-12-08 DIAGNOSIS — D0512 Intraductal carcinoma in situ of left breast: Secondary | ICD-10-CM | POA: Diagnosis not present

## 2015-12-08 LAB — CBC WITH DIFFERENTIAL/PLATELET
BASOS PCT: 0.4 % (ref 0.0–3.0)
Basophils Absolute: 0 10*3/uL (ref 0.0–0.1)
EOS PCT: 0.2 % (ref 0.0–5.0)
Eosinophils Absolute: 0 10*3/uL (ref 0.0–0.7)
HCT: 38 % (ref 36.0–46.0)
HEMOGLOBIN: 12.4 g/dL (ref 12.0–15.0)
LYMPHS ABS: 2.8 10*3/uL (ref 0.7–4.0)
Lymphocytes Relative: 48.2 % — ABNORMAL HIGH (ref 12.0–46.0)
MCHC: 32.7 g/dL (ref 30.0–36.0)
MCV: 87.3 fl (ref 78.0–100.0)
MONO ABS: 0.4 10*3/uL (ref 0.1–1.0)
Monocytes Relative: 6.8 % (ref 3.0–12.0)
NEUTROS ABS: 2.6 10*3/uL (ref 1.4–7.7)
Neutrophils Relative %: 44.4 % (ref 43.0–77.0)
PLATELETS: 314 10*3/uL (ref 150.0–400.0)
RBC: 4.36 Mil/uL (ref 3.87–5.11)
RDW: 15.6 % — AB (ref 11.5–15.5)
WBC: 5.9 10*3/uL (ref 4.0–10.5)

## 2015-12-08 LAB — COMPREHENSIVE METABOLIC PANEL
ALBUMIN: 3.9 g/dL (ref 3.5–5.2)
ALT: 22 U/L (ref 0–35)
AST: 32 U/L (ref 0–37)
Alkaline Phosphatase: 56 U/L (ref 39–117)
BUN: 12 mg/dL (ref 6–23)
CHLORIDE: 106 meq/L (ref 96–112)
CO2: 29 mEq/L (ref 19–32)
Calcium: 9.3 mg/dL (ref 8.4–10.5)
Creatinine, Ser: 0.73 mg/dL (ref 0.40–1.20)
GFR: 100.27 mL/min (ref >60.00)
Glucose, Bld: 104 mg/dL — ABNORMAL HIGH (ref 70–99)
POTASSIUM: 3.8 meq/L (ref 3.5–5.1)
SODIUM: 141 meq/L (ref 135–145)
Total Bilirubin: 0.4 mg/dL (ref 0.2–1.2)
Total Protein: 6.9 g/dL (ref 6.0–8.3)

## 2015-12-08 LAB — LIPID PANEL
CHOLESTEROL: 232 mg/dL — AB (ref 0–200)
HDL: 55.6 mg/dL (ref >39.00)
LDL CALC: 141 mg/dL — AB (ref 0–99)
NonHDL: 176.28
TRIGLYCERIDES: 174 mg/dL — AB (ref 0.0–149.0)
Total CHOL/HDL Ratio: 4
VLDL: 34.8 mg/dL (ref 0.0–40.0)

## 2015-12-08 LAB — HEMOGLOBIN A1C: Hgb A1c MFr Bld: 6.8 % — ABNORMAL HIGH (ref 4.6–6.5)

## 2015-12-08 MED ORDER — TERCONAZOLE 0.8 % VA CREA: 1.0000 | TOPICAL_CREAM | Freq: Every day | VAGINAL | Status: DC

## 2015-12-08 MED ORDER — NYSTATIN-TRIAMCINOLONE 100000-0.1 UNIT/GM-% EX CREA: 1.0000 "application " | TOPICAL_CREAM | Freq: Two times a day (BID) | CUTANEOUS | Status: DC

## 2015-12-08 NOTE — Patient Instructions (Addendum)
Go to the lab on the way out.  We'll contact you with your lab report. Check with your insurance to see if they will cover the shingles shot. Call about an appointment with the gynecology clinic. If you need help getting an appointment, then let me know.  Use the two creams and see if that help with the itching.  I'll await the notes from the cancer clinic.  Stay off the cholesterol medicine for now.  Take care.  Glad to see you.

## 2015-12-08 NOTE — Progress Notes (Signed)
Pre visit review using our clinic review tool, if applicable. No additional management support is needed unless otherwise documented below in the visit note.  I will have personally reviewed the Medicare Annual Wellness questionnaire after she drops it off and will have noted 1. The patient's medical and social history- see EMR 2. Their use of alcohol, tobacco or illicit drugs- see EMR 3. Their current medications and supplements- see EMR 4. The patient's functional ability including ADL's, fall risks, home safety risks and hearing or visual             Impairment.  No vision loss.  Fall cautions d/w pt.  Able to do ADLs in discussion with patient.  Safe at home.   5. Diet and physical activities- d/w pt.   6. Evidence for depression or mood disorders.  Not depressed.  She has difficult with the holidays and the concurrent anniversary of mult family members deaths, but has been functional and stable.    The patients weight, height, BMI have been recorded in the chart and visual acuity is per eye clinic.  I have made referrals, counseling and provided education to the patient based review of the above and I have provided the pt with a written personalized care plan for preventive services.  Provider list updated- see scanned forms.  Routine anticipatory guidance given to patient.  See health maintenance.  Flu 2016 Shingles d/w pt PNA 2015 Tetanus 2008 Colonoscopy 2014 Mammogram per onc.  Advance directive- daughter [name] designated if patient were incapacitated.   Cognitive function addressed- see scanned forms when she drops them off- and if abnormal then additional documentation follows (A&O x3, normal recall 3/3, math and clock reading wnl).   Diet and exercise d/w pt.    Diabetes:  Using medications without difficulties:yes Hypoglycemic episodes:no Hyperglycemic episodes:no Feet problems:no Blood Sugars averaging:not checked eye exam within last year: yes, ~4 week ago.  No  retinopathy per patient report.    HLD.  Not on statin.  Had been on brand name crestor.  Didn't tolerate the change to generic with HA and aches.  D/w pt.  F/u lipids pending.    Recurrently yeast infections and itching.  Since starting current treatment.  Improved transiently with current meds. Asking about change to topical tx, this is reasonable.  See orders. She declined pelvic exam here but will call about f/u with gyn, d/w pt.    Stress incontinence noted.  She'll f/u with gyn.  She declined pelvic exam today.  No burning with urination. She has tried [employer], w/o improvement.    PMH and SH reviewed  Meds, vitals, and allergies reviewed.   ROS: See HPI.  Otherwise negative.    GEN: nad, alert and oriented HEENT: mucous membranes moist NECK: supple w/o LA CV: rrr. PULM: ctab, no inc wob ABD: soft, +bs EXT: no edema  Diabetic foot exam: Normal inspection No skin breakdown No calluses  Normal DP pulses Normal sensation to light touch and monofilament Nails normal

## 2015-12-08 NOTE — Progress Notes (Signed)
[place]  [phone] [phone]     ID: Kelly Kennedy DOB: 11-09-1941  MR#: [id number]  [id number]  Patient Care Team: [name], MD as PCP - General Sylvan Cheese, NP as Nurse Practitioner (Hematology and Oncology) Chauncey Cruel, MD as Consulting Physician (Oncology) [name], MD as Consulting Physician (General Surgery) Crissie Reese, MD as Consulting Physician (Plastic Surgery) PCP: [name], MD GYN: SU: [name] MD OTHER MD:  CHIEF COMPLAINT: Ductal carcinoma in situ  CURRENT TREATMENT: tamoxifen   BREAST CANCER HISTORY: From the original intake note:  "Kelly Kennedy" had routine screening mammography with tomography at the breast Center for 20 08/18/2015 showing possible new calcifications in the left breast. Left diagnostic mammography on 04/02/2015 showed pleomorphic calcifications measuring up to 8 mm in the upper outer quadrant. Biopsy of this area on 04/07/2015 showed ([id number] [id number]) ductal carcinoma in situ, low to intermediate grade, estrogen receptor 97% positive, progesterone receptor 97% positive, both with strong staining intensity.  A second group of calcifications, more anterior, also in the upper outer quadrant, was biopsied 04/22/2015 showing (SAA [id number]) ductal carcinoma in situ, grade 2, estrogen receptor 100% positive, progesterone receptor 98% positive, both with strong staining intensity.  On 05/06/2015 the patient underwent left partial mastectomy which showed intermediate grade ductal carcinoma in situ extending over 7.9 cm. Both sentinel lymph nodes were clear. Margins were less than a millimeter anteriorly and posteriorly as well as laterally.  Given the many exceedingly close margins and the cosmetic defect already present, the patient has opted for left mastectomy with immediate reconstruction.   Her subsequent history is as detailed below.  INTERVAL HISTORY: Kelly Kennedy  returns today for follow up of her noninvasive breast cancer. She started tamoxifen 09/29/2015. She is tolerating that well, except for hot flashes and some vaginal wetness. She obtains it at a good price.  REVIEW OF SYSTEMS: Kelly Kennedy the holidays difficult because her husband died on Christmas Day sometime ago. She also remembers her son who died from heart attack at 76. She is having some night sweats from the tamoxifen and they can wake her up. She gets leg cramps at times. She has seasonal allergies. She has problems with her dentures. Her brow does not fit well. Just a little bit of a dry cough at times. She has heartburn. She has stress urinary incontinence. She bruises easily. She has occasional headaches. She thinks her sugar is doing well but she is not taking it regularly. A detailed review of systems today was otherwise stable  PAST MEDICAL HISTORY: Past Medical History  Diagnosis Date  . Hyperlipidemia   . Diverticulosis of colon 06/2005  . Hypothyroid   . Insomnia   . Menopausal symptoms   . Hypertension   . Hyperglycemia   . adenomatous Colon polyps     colonoscopy 8/99, 12/02, 8/06  . Pericarditis 1980's  . Chest pain     uses NTG as needed  . Depression   . Bell's palsy   . GERD (gastroesophageal reflux disease)   . H/O hiatal hernia   . Headache(784.0)   . Arthritis   . Irritable bowel   . Diabetes mellitus without complication ([place])   . TIA (transient ischemic attack)     09/2014  . Ductal carcinoma in situ (DCIS) of left breast   . Cancer (HCC)     DCIS left breast  . Family history of adverse reaction to anesthesia     " my mother  didn't wake up so they had to put her on life support for about an hour or more; my sisiter has problems waking up too from anesthesia.  . Stroke [facility] [place])     PAST SURGICAL HISTORY: Past Surgical History  Procedure Laterality Date  . Partial hysterectomy      ovaries intact  . Cholecystectomy  1990  . Ectopic pregnancy  surgery  1971  . Cystectomy  05/04/08    left lower arm, benign  . Tonsillectomy    . Appendectomy    . Cardiac catheterization    . Fracture surgery      nose  . Tubal ligation    . Orif ankle fracture Left 10/23/2013    Procedure: OPEN REDUCTION INTERNAL FIXATION (ORIF) LEFT ANKLE FRACTURE;  Surgeon: Marianna Payment, MD;  Location: [place];  Service: Orthopedics;  Laterality: Left;  . Cataract extraction Bilateral 2016  . Partial mastectomy with needle localization and axillary sentinel lymph node bx Left 05/06/2015    Procedure: LEFT BREAST PARTIAL MASTECTOMY WITH NEEDLE LOCALIZATION AND SENTINEL LYMPH NODE MAPPING;  Surgeon: [name], MD;  Location: [place];  Service: General;  Laterality: Left;  . Nipple sparing mastectomy Left 08/06/2015    Procedure: LEFT NIPPLE SPARING MASTECTOMY;  Surgeon: [name], MD;  Location: [place];  Service: General;  Laterality: Left;  . Breast reconstruction with placement of tissue expander and flex hd (acellular hydrated dermis) Left 08/06/2015    Procedure: LEFT BREAST RECONSTRUCTION WITH PLACEMENT OF SALINE IMPLANT AND ACELLULAR DURMAL MATRIX;  Surgeon: Crissie Reese, MD;  Location: [place];  Service: Plastics;  Laterality: Left;    FAMILY HISTORY Family History  Problem Relation Age of Onset  . Cancer Father     stomach   . Heart disease Son     MI  . Arthritis Mother   . Diabetes Mother   . Colon cancer Neg Hx   . Pancreatic cancer Neg Hx   . Stomach cancer Neg Hx   . Breast cancer Neg Hx   . Cancer Brother     lung cancer   the patient's father died from stomach cancer at age 64. The patient's mother died from gangrene at the age of 45. The patient has one sister, 9 brothers. Her sister had breast cancer diagnosed after the age of 44. There is no history of ovarian cancer in the family.  GYNECOLOGIC HISTORY:  No LMP recorded. Patient has had a hysterectomy. Menarche age 48, first live birth age 69, the patient is GX  P3. She underwent hysterectomy more than 20 years ago. She does believe her ovaries were removed at that time. She was on hormone replacement until earlier this year.  SOCIAL HISTORY:  She used to work in a [employer] but is now retired. She is widowed--her husband died Christmas Day sometime ago. At home she lives with her sister [name]. One of the patient's sons died from a massive heart attack while playing basketball at age 12.. The patient's daughter [name] is currently unemployed. The patient has 4 grandchildren and at least 99 great-grandchildren. She attends a [employer] locally    ADVANCED DIRECTIVES: Not in place   HEALTH MAINTENANCE: Social History  Substance Use Topics  . Smoking status: Former Research scientist (life sciences)  . Smokeless tobacco: Former Systems developer  . Alcohol Use: No     Comment: social     Colonoscopy: 09/09/2010, 09/26/2013  PAP:  Bone density: 2013?/Normal per patient report  Lipid panel:  Allergies  Allergen Reactions  . Ace Inhibitors Other (See Comments)    H/o lip swelling with change in losartan pill  . Angiotensin Receptor Blockers Other (See Comments)    H/o lip swelling with change in losartan pill  . Crestor [Rosuvastatin Calcium] Other (See Comments)    HA, aches, intolerant  . Niacin Rash  . Red Dye Rash    Current Outpatient Prescriptions  Medication Sig Dispense Refill  . amitriptyline (ELAVIL) 50 MG tablet Take 1 tablet by mouth at bedtime. Take 1-2 tabs daily at bedtime  1  . amLODipine (NORVASC) 5 MG tablet Take 0.5 tablets (2.5 mg total) by mouth daily.    Marland Kitchen aspirin EC 81 MG tablet Take 81 mg by mouth daily.    . Aspirin-Salicylamide-Caffeine (BC HEADACHE POWDER PO) Take 1 Package by mouth daily as needed (pain).     . COMBIGAN 0.2-0.5 % ophthalmic solution PLACE 1 DROP INTO BOTH EYES TWICE A DAY  2  . fluocinonide cream (LIDEX) 0.05 % APPLY TOPICALLY DAILY AS NEEDED 30 g 3  . glipiZIDE (GLUCOTROL) 5 MG tablet Take 0.5 tablets (2.5 mg total) by mouth  daily before breakfast. 60 tablet 3  . Hypromellose (GENTEAL OP) Apply 1 application to eye at bedtime.    Marland Kitchen levothyroxine (SYNTHROID, LEVOTHROID) 100 MCG tablet TAKE ONE TABLET BY MOUTH DAILY EXCEPT ON MONDAY AND WEDNESDAY TAKE 1/2 TABLET 90 tablet 3  . magnesium oxide (MAG-OX) 400 (241.3 MG) MG tablet TAKE 1 TABLET (400 MG TOTAL) BY MOUTH DAILY. 30 tablet 11  . nitroGLYCERIN (NITROSTAT) 0.4 MG SL tablet Place 0.4 mg under the tongue every 5 (five) minutes as needed for chest pain.     Marland Kitchen nystatin-triamcinolone (MYCOLOG II) cream Apply 1 application topically 2 (two) times daily. 30 g 0  . omeprazole (PRILOSEC OTC) 20 MG tablet Take 20 mg by mouth daily.    Vladimir Faster Glycol-Propyl Glycol (SYSTANE OP) Place 1 drop into both eyes at bedtime.     . Probiotic Product (ADVANCED PROBIOTIC) CAPS Take 1 capsule by mouth every morning. 20 capsule 0  . tamoxifen (NOLVADEX) 20 MG tablet Take 1 tablet (20 mg total) by mouth daily. 30 tablet 6  . terconazole (TERAZOL 3) 0.8 % vaginal cream Place 1 applicator vaginally at bedtime. 20 g 0  . traMADol (ULTRAM) 50 MG tablet Take 50 mg by mouth every 6 (six) hours as needed for moderate pain.     Marland Kitchen VOLTAREN 1 % GEL Apply 2 g topically 2 (two) times daily as needed (pain).      No current facility-administered medications for this visit.    OBJECTIVE: Middle-aged African-American woman in no acute distress  Filed Vitals:   12/08/15 1452  BP: 166/75  Pulse: 90  Temp: 97.8 F (36.6 C)  Resp: 19     Body mass index is 33.01 kg/(m^2).    ECOG FS:0 - Asymptomatic  Sclerae unicteric, EOMs intact Oropharynx clear, no thrush or other lesions No cervical or supraclavicular adenopathy Lungs no rales or rhonchi Heart regular rate and rhythm Abd soft, nontender, positive bowel sounds MSK no focal spinal tenderness, no upper extremity lymphedema Neuro: nonfocal, well oriented, appropriate affect Breasts: the right breast is unremarkable. The left breast is  status post mastectomy with implant reconstruction. The cosmetic result is good. There is no evidence of local recurrence. The left axilla is benign.   LAB RESULTS:  CMP     Component Value Date/Time   NA 141 12/08/2015 1207  NA 141 06/10/2015 1549   K 3.8 12/08/2015 1207   K 3.8 06/10/2015 1549   CL 106 12/08/2015 1207   CO2 29 12/08/2015 1207   CO2 26 06/10/2015 1549   GLUCOSE 104* 12/08/2015 1207   GLUCOSE 111 06/10/2015 1549   GLUCOSE 113* 12/11/2006 1253   BUN 12 12/08/2015 1207   BUN 16.6 06/10/2015 1549   CREATININE 0.73 12/08/2015 1207   CREATININE 1.3* 06/10/2015 1549   CREATININE 0.79 10/08/2012 1750   CALCIUM 9.3 12/08/2015 1207   CALCIUM 9.3 06/10/2015 1549   PROT 6.9 12/08/2015 1207   PROT 7.0 06/10/2015 1549   ALBUMIN 3.9 12/08/2015 1207   ALBUMIN 3.7 06/10/2015 1549   AST 32 12/08/2015 1207   AST 30 06/10/2015 1549   ALT 22 12/08/2015 1207   ALT 31 06/10/2015 1549   ALKPHOS 56 12/08/2015 1207   ALKPHOS 66 06/10/2015 1549   BILITOT 0.4 12/08/2015 1207   BILITOT 0.30 06/10/2015 1549   GFRNONAA 47* 11/10/2015 0323   GFRAA 54* 11/10/2015 0323    INo results found for: SPEP, UPEP  Lab Results  Component Value Date   WBC 5.9 12/08/2015   NEUTROABS 2.6 12/08/2015   HGB 12.4 12/08/2015   HCT 38.0 12/08/2015   MCV 87.3 12/08/2015   PLT 314.0 12/08/2015      Chemistry      Component Value Date/Time   NA 141 12/08/2015 1207   NA 141 06/10/2015 1549   K 3.8 12/08/2015 1207   K 3.8 06/10/2015 1549   CL 106 12/08/2015 1207   CO2 29 12/08/2015 1207   CO2 26 06/10/2015 1549   BUN 12 12/08/2015 1207   BUN 16.6 06/10/2015 1549   CREATININE 0.73 12/08/2015 1207   CREATININE 1.3* 06/10/2015 1549   CREATININE 0.79 10/08/2012 1750      Component Value Date/Time   CALCIUM 9.3 12/08/2015 1207   CALCIUM 9.3 06/10/2015 1549   ALKPHOS 56 12/08/2015 1207   ALKPHOS 66 06/10/2015 1549   AST 32 12/08/2015 1207   AST 30 06/10/2015 1549   ALT 22 12/08/2015  1207   ALT 31 06/10/2015 1549   BILITOT 0.4 12/08/2015 1207   BILITOT 0.30 06/10/2015 1549       No results found for: LABCA2  No components found for: LABCA125  No results for input(s): INR in the last 168 hours.  Urinalysis    Component Value Date/Time   COLORURINE YELLOW 11/10/2015 0332   APPEARANCEUR TURBID* 11/10/2015 0332   LABSPEC 1.019 11/10/2015 0332   PHURINE 5.5 11/10/2015 0332   GLUCOSEU NEGATIVE 11/10/2015 0332   HGBUR NEGATIVE 11/10/2015 0332   HGBUR negative 07/16/2010 1521   BILIRUBINUR NEGATIVE 11/10/2015 0332   KETONESUR 15* 11/10/2015 0332   PROTEINUR NEGATIVE 11/10/2015 0332   UROBILINOGEN 0.2 09/29/2014 0645   NITRITE NEGATIVE 11/10/2015 0332   LEUKOCYTESUR SMALL* 11/10/2015 0332    STUDIES: No results found.  ASSESSMENT: 75 y.o. [place] woman status post left lumpectomy and sentinel lymph node sampling 05/06/2015 for extensive ductal carcinoma in situ, grade 2, estrogen and progesterone receptor strongly positive, with both sentinel lymph nodes negative, but exceedingly close margins  (1) left nipple sparing mastectomy with immediate implant reconstruction on 08/06/2015  (2) tamoxifen for breast cancer prevention started 09/29/2015  PLAN: Kelly Kennedy is tolerating her tamoxifen well. Hopefully the hot flashes will improve with time. I can't tell if the vaginal discharge she is having is due to tamoxifen or fungal infection. Her primary care doctor has started  her on Diflucan and I have no problem with that.  Some of the discomfort she is having in the left breast area is simply postsurgical. She might consider a sports bra instead of her current bra in I have suggested she stop by at [facility] [employer]" and see what is available there.  She requested a prescription for brand name Crestor. She says the generic does not work for her and causes her to have a rash. I am concerned about cost issues but went ahead and wrote her a prescription for the 5 mg  tablet. She will discuss this further with her primary care physician at their next visit.  Otherwise she will have her next mammogram in June and see as again in July. From that point we will start seeing her on a once a year basis. She knows to call for any problems that may develop before then.   Chauncey Cruel, MD   12/08/2015 3:14 PM

## 2015-12-08 NOTE — Telephone Encounter (Signed)
Noted  

## 2015-12-08 NOTE — Telephone Encounter (Signed)
Will d/w pt at [facility].  Thanks.

## 2015-12-10 NOTE — Assessment & Plan Note (Signed)
F/u with onc pending.  App onc input.

## 2015-12-10 NOTE — Assessment & Plan Note (Signed)
I'll defer pelvic exam to gyn.  Change to topical tx.  Update me as needed.

## 2015-12-10 NOTE — Assessment & Plan Note (Signed)
Flu 2016 Shingles d/w pt PNA 2015 Tetanus 2008 Colonoscopy 2014 Mammogram per onc.  Advance directive- daughter [name] designated if patient were incapacitated.   Cognitive function addressed- see scanned forms when she drops them off- and if abnormal then additional documentation follows (A&O x3, normal recall 3/3, math and clock reading wnl).   Diet and exercise d/w pt.

## 2015-12-10 NOTE — Assessment & Plan Note (Signed)
Controlled, no change in DM2 meds.  See notes on labs.  I don't want to push her A1c much lower and risk hypoglycemia.

## 2015-12-10 NOTE — Assessment & Plan Note (Signed)
Reasonable control. 

## 2015-12-10 NOTE — Assessment & Plan Note (Signed)
Not worse off lasix.  Continue as is.

## 2015-12-10 NOTE — Assessment & Plan Note (Signed)
Off med at time of the lab draw.  See notes on labs.

## 2015-12-11 ENCOUNTER — Telehealth: Payer: Self-pay | Admitting: Nurse Practitioner

## 2015-12-11 NOTE — Telephone Encounter (Signed)
Called patient and she is aware of her july follow up

## 2015-12-30 DIAGNOSIS — H11153 Pinguecula, bilateral: Secondary | ICD-10-CM | POA: Diagnosis not present

## 2015-12-30 DIAGNOSIS — Z961 Presence of intraocular lens: Secondary | ICD-10-CM | POA: Diagnosis not present

## 2015-12-30 DIAGNOSIS — H40013 Open angle with borderline findings, low risk, bilateral: Secondary | ICD-10-CM | POA: Diagnosis not present

## 2015-12-30 DIAGNOSIS — Z9849 Cataract extraction status, unspecified eye: Secondary | ICD-10-CM | POA: Diagnosis not present

## 2015-12-30 DIAGNOSIS — H18413 Arcus senilis, bilateral: Secondary | ICD-10-CM | POA: Diagnosis not present

## 2015-12-30 DIAGNOSIS — H11423 Conjunctival edema, bilateral: Secondary | ICD-10-CM | POA: Diagnosis not present

## 2016-01-23 ENCOUNTER — Other Ambulatory Visit: Payer: Self-pay | Admitting: Family Medicine

## 2016-01-27 DIAGNOSIS — Z961 Presence of intraocular lens: Secondary | ICD-10-CM | POA: Diagnosis not present

## 2016-01-27 DIAGNOSIS — H40023 Open angle with borderline findings, high risk, bilateral: Secondary | ICD-10-CM | POA: Diagnosis not present

## 2016-01-27 DIAGNOSIS — Z83511 Family history of glaucoma: Secondary | ICD-10-CM | POA: Diagnosis not present

## 2016-01-27 DIAGNOSIS — H18413 Arcus senilis, bilateral: Secondary | ICD-10-CM | POA: Diagnosis not present

## 2016-01-27 DIAGNOSIS — Z9849 Cataract extraction status, unspecified eye: Secondary | ICD-10-CM | POA: Diagnosis not present

## 2016-01-27 DIAGNOSIS — H40043 Steroid responder, bilateral: Secondary | ICD-10-CM | POA: Diagnosis not present

## 2016-01-27 DIAGNOSIS — H353131 Nonexudative age-related macular degeneration, bilateral, early dry stage: Secondary | ICD-10-CM | POA: Diagnosis not present

## 2016-01-27 DIAGNOSIS — H11153 Pinguecula, bilateral: Secondary | ICD-10-CM | POA: Diagnosis not present

## 2016-01-27 DIAGNOSIS — H11423 Conjunctival edema, bilateral: Secondary | ICD-10-CM | POA: Diagnosis not present

## 2016-01-28 ENCOUNTER — Encounter: Payer: Self-pay | Admitting: Adult Health

## 2016-01-28 ENCOUNTER — Other Ambulatory Visit: Payer: Self-pay | Admitting: Family Medicine

## 2016-01-28 NOTE — Progress Notes (Signed)
A birthday card was mailed to the patient today on behalf of the Survivorship Program at [facility].   Gretchen Dawson, NP Survivorship Program [facility] [phone]  

## 2016-01-28 NOTE — Telephone Encounter (Signed)
Electronic refill request. Last Filled:     1 11/12/2015  Please advise.

## 2016-01-29 DIAGNOSIS — Z853 Personal history of malignant neoplasm of breast: Secondary | ICD-10-CM | POA: Diagnosis not present

## 2016-01-29 NOTE — Telephone Encounter (Signed)
Sent. Thanks.   

## 2016-02-04 ENCOUNTER — Other Ambulatory Visit: Payer: Self-pay | Admitting: Family Medicine

## 2016-02-04 ENCOUNTER — Other Ambulatory Visit: Payer: Self-pay | Admitting: Internal Medicine

## 2016-02-04 NOTE — Telephone Encounter (Signed)
Sent. Thanks.   

## 2016-02-04 NOTE — Telephone Encounter (Signed)
Electronic refill request. Last Filled:    30 g 0 12/08/2015  Please advise.

## 2016-02-05 NOTE — Telephone Encounter (Signed)
Last filled 10/06/15 with 3 refills--please advise

## 2016-02-07 NOTE — Telephone Encounter (Signed)
Sent. Thanks.   

## 2016-02-08 ENCOUNTER — Other Ambulatory Visit: Payer: Self-pay | Admitting: Otolaryngology

## 2016-02-08 DIAGNOSIS — Z01818 Encounter for other preprocedural examination: Secondary | ICD-10-CM

## 2016-02-08 DIAGNOSIS — Z853 Personal history of malignant neoplasm of breast: Secondary | ICD-10-CM

## 2016-02-08 DIAGNOSIS — Z9882 Breast implant status: Secondary | ICD-10-CM | POA: Diagnosis not present

## 2016-02-09 ENCOUNTER — Ambulatory Visit
Admission: RE | Admit: 2016-02-09 | Discharge: 2016-02-09 | Disposition: A | Discharge status: Home / Self Care | Payer: Commercial Managed Care - HMO | Source: Ambulatory Visit | Attending: Otolaryngology | Admitting: Otolaryngology

## 2016-02-09 ENCOUNTER — Other Ambulatory Visit: Payer: Self-pay | Admitting: Otolaryngology

## 2016-02-09 DIAGNOSIS — Z01818 Encounter for other preprocedural examination: Secondary | ICD-10-CM

## 2016-02-09 DIAGNOSIS — R928 Other abnormal and inconclusive findings on diagnostic imaging of breast: Secondary | ICD-10-CM | POA: Diagnosis not present

## 2016-02-09 DIAGNOSIS — Z853 Personal history of malignant neoplasm of breast: Secondary | ICD-10-CM

## 2016-02-11 DIAGNOSIS — Z853 Personal history of malignant neoplasm of breast: Secondary | ICD-10-CM | POA: Diagnosis not present

## 2016-02-11 DIAGNOSIS — N6481 Ptosis of breast: Secondary | ICD-10-CM | POA: Diagnosis not present

## 2016-02-15 DIAGNOSIS — E119 Type 2 diabetes mellitus without complications: Secondary | ICD-10-CM | POA: Diagnosis not present

## 2016-02-15 DIAGNOSIS — H40013 Open angle with borderline findings, low risk, bilateral: Secondary | ICD-10-CM | POA: Diagnosis not present

## 2016-02-15 DIAGNOSIS — H26492 Other secondary cataract, left eye: Secondary | ICD-10-CM | POA: Diagnosis not present

## 2016-02-15 DIAGNOSIS — Z961 Presence of intraocular lens: Secondary | ICD-10-CM | POA: Diagnosis not present

## 2016-02-23 ENCOUNTER — Ambulatory Visit (INDEPENDENT_AMBULATORY_CARE_PROVIDER_SITE_OTHER): Payer: Commercial Managed Care - HMO | Admitting: Family Medicine

## 2016-02-23 ENCOUNTER — Encounter: Payer: Self-pay | Admitting: Family Medicine

## 2016-02-23 VITALS — BP 140/86 | HR 92 | Temp 97.8°F | Ht 63.0 in | Wt 188.0 lb

## 2016-02-23 DIAGNOSIS — R300 Dysuria: Secondary | ICD-10-CM | POA: Diagnosis not present

## 2016-02-23 DIAGNOSIS — M545 Low back pain, unspecified: Secondary | ICD-10-CM | POA: Insufficient documentation

## 2016-02-23 LAB — POC URINALSYSI DIPSTICK (AUTOMATED)
Bilirubin, UA: NEGATIVE
Blood, UA: NEGATIVE
Glucose, UA: NEGATIVE
KETONES UA: NEGATIVE
LEUKOCYTES UA: NEGATIVE
NITRITE UA: NEGATIVE
PH UA: 6
PROTEIN UA: NEGATIVE
Spec Grav, UA: >=1.03
Urobilinogen, UA: 0.2

## 2016-02-23 MED ORDER — DICLOFENAC SODIUM 75 MG PO TBEC: 75.0000 mg | DELAYED_RELEASE_TABLET | Freq: Two times a day (BID) | ORAL | Status: DC

## 2016-02-23 MED ORDER — CYCLOBENZAPRINE HCL 5 MG PO TABS: 5.0000 mg | ORAL_TABLET | Freq: Every evening | ORAL | Status: DC | PRN

## 2016-02-23 NOTE — Assessment & Plan Note (Signed)
UA clear. Most likely MSK strain. Treat with NSAID, low dose muscle relaxant, heat massage and home PT.

## 2016-02-23 NOTE — Progress Notes (Signed)
   Subjective:    Patient ID: Kelly Kennedy, female    DOB: [birth year], 75 y.o.   MRN: [id number]  HPI 75 year old presents with low back pain x 3 days, on left.  No radiation of pain down leg.  No fall, no injury, no MVA. Weak in legs off and on, for a while.  No numbness.  Increase in pain with bending over.  Hx of breast cancer in left breast, s/p mastectomy. Recent breast biopsy in right breast, on pain pills are making her burn. Currently treated for yeast infection. No urgency, no frequency, no hematuria.  She is retired.  Social History /Family History/Past Medical History reviewed and updated if needed. No history of back issues.    Review of Systems  Constitutional: Negative for fever and fatigue.  HENT: Negative for ear pain.   Eyes: Negative for pain.  Respiratory: Negative for chest tightness and shortness of breath.   Cardiovascular: Negative for chest pain, palpitations and leg swelling.  Gastrointestinal: Negative for abdominal pain.  Genitourinary: Positive for dysuria and vaginal pain.         Objective:   Physical Exam  Constitutional: Vital signs are normal. She appears well-developed and well-nourished. She is cooperative.  Non-toxic appearance. She does not appear ill. No distress.  HENT:  Head: Normocephalic.  Right Ear: Hearing, tympanic membrane, external ear and ear canal normal. Tympanic membrane is not erythematous, not retracted and not bulging.  Left Ear: Hearing, tympanic membrane, external ear and ear canal normal. Tympanic membrane is not erythematous, not retracted and not bulging.  Nose: No mucosal edema or rhinorrhea. Right sinus exhibits no maxillary sinus tenderness and no frontal sinus tenderness. Left sinus exhibits no maxillary sinus tenderness and no frontal sinus tenderness.  Mouth/Throat: Uvula is midline, oropharynx is clear and moist and mucous membranes are normal.  Eyes: Conjunctivae, EOM and lids are normal. Pupils are  equal, round, and reactive to light. Lids are everted and swept, no foreign bodies found.  Neck: Trachea normal and normal range of motion. Neck supple. Carotid bruit is not present. No thyroid mass and no thyromegaly present.  Cardiovascular: Normal rate, regular rhythm, S1 normal, S2 normal, normal heart sounds, intact distal pulses and normal pulses.  Exam reveals no gallop and no friction rub.   No murmur heard. Pulmonary/Chest: Effort normal and breath sounds normal. No tachypnea. No respiratory distress. She has no decreased breath sounds. She has no wheezes. She has no rhonchi. She has no rales.  Abdominal: Soft. Normal appearance and bowel sounds are normal. There is no tenderness.  Musculoskeletal:       Lumbar back: She exhibits decreased range of motion, tenderness and bony tenderness.  Neg SLR, nml sensation, nml gait and nml strength in bilateral lower ext.  Neurological: She is alert.  Skin: Skin is warm, dry and intact. No rash noted.  Psychiatric: Her speech is normal and behavior is normal. Judgment and thought content normal. Her mood appears not anxious. Cognition and memory are normal. She does not exhibit a depressed mood.          Assessment & Plan:

## 2016-02-23 NOTE — Patient Instructions (Signed)
Start diclofenac twice daily for pain and inflammation, take with food and stop if any stomach irritation.. Can use muscle relaxant at night.  Heat,, massage and start low back stretches.  Call if not improving or follow up with PCP in next 2 weeks.

## 2016-02-23 NOTE — Progress Notes (Signed)
Pre visit review using our clinic review tool, if applicable. No additional management support is needed unless otherwise documented below in the visit note. 

## 2016-03-10 ENCOUNTER — Encounter: Payer: Self-pay | Admitting: Obstetrics

## 2016-03-10 ENCOUNTER — Ambulatory Visit (INDEPENDENT_AMBULATORY_CARE_PROVIDER_SITE_OTHER): Payer: Commercial Managed Care - HMO | Admitting: Obstetrics

## 2016-03-10 VITALS — BP 166/89 | HR 92 | Temp 98.1°F | Wt 186.0 lb

## 2016-03-10 DIAGNOSIS — B3731 Acute candidiasis of vulva and vagina: Secondary | ICD-10-CM

## 2016-03-10 DIAGNOSIS — Z01419 Encounter for gynecological examination (general) (routine) without abnormal findings: Secondary | ICD-10-CM

## 2016-03-10 DIAGNOSIS — C50112 Malignant neoplasm of central portion of left female breast: Secondary | ICD-10-CM

## 2016-03-10 DIAGNOSIS — Z780 Asymptomatic menopausal state: Secondary | ICD-10-CM | POA: Diagnosis not present

## 2016-03-10 DIAGNOSIS — B373 Candidiasis of vulva and vagina: Secondary | ICD-10-CM | POA: Diagnosis not present

## 2016-03-10 LAB — POCT URINALYSIS DIPSTICK
Bilirubin, UA: NEGATIVE
Glucose, UA: NEGATIVE
Ketones, UA: NEGATIVE
Leukocytes, UA: NEGATIVE
Nitrite, UA: NEGATIVE
PH UA: 5
PROTEIN UA: NEGATIVE
RBC UA: NEGATIVE
SPEC GRAV UA: 1.02
UROBILINOGEN UA: NEGATIVE

## 2016-03-10 MED ORDER — TERCONAZOLE 0.4 % VA CREA: 1.0000 | TOPICAL_CREAM | Freq: Every day | VAGINAL | Status: DC

## 2016-03-10 MED ORDER — FLUCONAZOLE 200 MG PO TABS: 200.0000 mg | ORAL_TABLET | ORAL | Status: DC

## 2016-03-10 NOTE — Progress Notes (Signed)
Subjective:        Kelly Kennedy is a 75 y.o. female here for a routine exam.  Current complaints: Chronic yeast infections.  S/P TAH for uterine fibroids.  S/P left mastectomy for breast CA.  Personal health questionnaire:  Is patient Ashkenazi Jewish, have a family history of breast and/or ovarian cancer: yes Is there a family history of uterine cancer diagnosed at age < 55, gastrointestinal cancer, urinary tract cancer, family member who is a Field seismologist syndrome-associated carrier: no Is the patient overweight and hypertensive, family history of diabetes, personal history of gestational diabetes, preeclampsia or PCOS: yes Is patient over 57, have PCOS,  family history of premature CHD under age 12, diabetes, smoke, have hypertension or peripheral artery disease:  yes At any time, has a partner hit, kicked or otherwise hurt or frightened you?: no Over the past 2 weeks, have you felt down, depressed or hopeless?: no Over the past 2 weeks, have you felt little interest or pleasure in doing things?:no   Gynecologic History No LMP recorded. Patient has had a hysterectomy. Contraception: status post hysterectomy Last Pap: unknown. Results were: normal Last mammogram: 2017. Results were: normal  Obstetric History OB History  No data available    Past Medical History  Diagnosis Date  . Hyperlipidemia   . Diverticulosis of colon 06/2005  . Hypothyroid   . Insomnia   . Menopausal symptoms   . Hypertension   . Hyperglycemia   . adenomatous Colon polyps     colonoscopy 8/99, 12/02, 8/06  . Pericarditis 1980's  . Chest pain     uses NTG as needed  . Depression   . Bell's palsy   . GERD (gastroesophageal reflux disease)   . H/O hiatal hernia   . Headache(784.0)   . Arthritis   . Irritable bowel   . Diabetes mellitus without complication ([place])   . TIA (transient ischemic attack)     09/2014  . Ductal carcinoma in situ (DCIS) of left breast   . Cancer (HCC)     DCIS left  breast  . Family history of adverse reaction to anesthesia     " my mother didn't wake up so they had to put her on life support for about an hour or more; my sisiter has problems waking up too from anesthesia.  . Stroke [facility])     Past Surgical History  Procedure Laterality Date  . Partial hysterectomy      ovaries intact  . Cholecystectomy  1990  . Ectopic pregnancy surgery  1971  . Cystectomy  05/04/08    left lower arm, benign  . Tonsillectomy    . Appendectomy    . Cardiac catheterization    . Fracture surgery      nose  . Tubal ligation    . Orif ankle fracture Left 10/23/2013    Procedure: OPEN REDUCTION INTERNAL FIXATION (ORIF) LEFT ANKLE FRACTURE;  Surgeon: Marianna Payment, MD;  Location: [place];  Service: Orthopedics;  Laterality: Left;  . Cataract extraction Bilateral 2016  . Partial mastectomy with needle localization and axillary sentinel lymph node bx Left 05/06/2015    Procedure: LEFT BREAST PARTIAL MASTECTOMY WITH NEEDLE LOCALIZATION AND SENTINEL LYMPH NODE MAPPING;  Surgeon: [name], MD;  Location: [place];  Service: General;  Laterality: Left;  . Nipple sparing mastectomy Left 08/06/2015    Procedure: LEFT NIPPLE SPARING MASTECTOMY;  Surgeon: [name], MD;  Location: [place];  Service: General;  Laterality:  Left;  . Breast reconstruction with placement of tissue expander and flex hd (acellular hydrated dermis) Left 08/06/2015    Procedure: LEFT BREAST RECONSTRUCTION WITH PLACEMENT OF SALINE IMPLANT AND ACELLULAR DURMAL MATRIX;  Surgeon: Crissie Reese, MD;  Location: [place];  Service: Plastics;  Laterality: Left;     Current outpatient prescriptions:  .  amitriptyline (ELAVIL) 50 MG tablet, TAKE 1-2 TABLETS (50-100 MG TOTAL) BY MOUTH AT BEDTIME AS NEEDED FOR SLEEP., Disp: 180 tablet, Rfl: 1 .  amLODipine (NORVASC) 5 MG tablet, Take 0.5 tablets (2.5 mg total) by mouth daily., Disp: , Rfl:  .  aspirin EC 81 MG tablet, Take 81 mg by mouth daily.,  Disp: , Rfl:  .  Aspirin-Salicylamide-Caffeine (BC HEADACHE POWDER PO), Take 1 Package by mouth daily as needed (pain). , Disp: , Rfl:  .  COMBIGAN 0.2-0.5 % ophthalmic solution, PLACE 1 DROP INTO BOTH EYES TWICE A DAY, Disp: , Rfl: 2 .  cyclobenzaprine (FLEXERIL) 5 MG tablet, Take 1-2 tablets (5-10 mg total) by mouth at bedtime as needed for muscle spasms., Disp: 30 tablet, Rfl: 0 .  diclofenac (VOLTAREN) 75 MG EC tablet, Take 1 tablet (75 mg total) by mouth 2 (two) times daily., Disp: 30 tablet, Rfl: 0 .  fluconazole (DIFLUCAN) 150 MG tablet, TAKE 1 TABLET (150 MG TOTAL) BY MOUTH ONCE A WEEK., Disp: 3 tablet, Rfl: 3 .  glipiZIDE (GLUCOTROL) 5 MG tablet, Take 0.5 tablets (2.5 mg total) by mouth daily before breakfast., Disp: 60 tablet, Rfl: 3 .  Hypromellose (GENTEAL OP), Apply 1 application to eye at bedtime., Disp: , Rfl:  .  levothyroxine (SYNTHROID, LEVOTHROID) 100 MCG tablet, TAKE ONE TABLET BY MOUTH DAILY EXCEPT ON MONDAY AND WEDNESDAY TAKE 1/2 TABLET, Disp: 90 tablet, Rfl: 3 .  losartan (COZAAR) 100 MG tablet, Take 100 mg by mouth., Disp: , Rfl:  .  magnesium oxide (MAG-OX) 400 (241.3 MG) MG tablet, TAKE 1 TABLET (400 MG TOTAL) BY MOUTH DAILY., Disp: 30 tablet, Rfl: 11 .  nitroGLYCERIN (NITROSTAT) 0.4 MG SL tablet, Place 0.4 mg under the tongue every 5 (five) minutes as needed for chest pain. , Disp: , Rfl:  .  nitroGLYCERIN (NITROSTAT) 0.4 MG SL tablet, PLACE 1 TABLET (0.4 MG TOTAL) UNDER THE TONGUE EVERY 5 (FIVE) MINUTES AS NEEDED FOR CHEST PAIN., Disp: 25 tablet, Rfl: 3 .  omeprazole (PRILOSEC OTC) 20 MG tablet, Take 20 mg by mouth daily., Disp: , Rfl:  .  oxyCODONE-acetaminophen (PERCOCET/ROXICET) 5-325 MG tablet, Take by mouth., Disp: , Rfl:  .  Probiotic Product (ADVANCED PROBIOTIC) CAPS, Take 1 capsule by mouth every morning., Disp: 20 capsule, Rfl: 0 .  tamoxifen (NOLVADEX) 20 MG tablet, Take 1 tablet (20 mg total) by mouth daily., Disp: 30 tablet, Rfl: 6 .  traMADol (ULTRAM) 50 MG  tablet, Take 50 mg by mouth every 6 (six) hours as needed for moderate pain. , Disp: , Rfl:  .  VOLTAREN 1 % GEL, Apply 2 g topically 2 (two) times daily as needed (pain). , Disp: , Rfl:  Allergies  Allergen Reactions  . Ace Inhibitors Other (See Comments)    H/o lip swelling with change in losartan pill  . Angiotensin Receptor Blockers Other (See Comments)    H/o lip swelling with change in losartan pill  . Crestor [Rosuvastatin Calcium] Other (See Comments)    HA, aches, intolerant  . Niacin Rash  . Red Dye Rash    Social History  Substance Use Topics  . Smoking status:  Former Smoker  . Smokeless tobacco: Former Systems developer  . Alcohol Use: No     Comment: social    Family History  Problem Relation Age of Onset  . Cancer Father     stomach   . Heart disease Son     MI  . Arthritis Mother   . Diabetes Mother   . Colon cancer Neg Hx   . Pancreatic cancer Neg Hx   . Stomach cancer Neg Hx   . Breast cancer Neg Hx   . Cancer Brother     lung cancer      Review of Systems  Constitutional: negative for fatigue and weight loss Respiratory: negative for cough and wheezing Cardiovascular: negative for chest pain, fatigue and palpitations Gastrointestinal: negative for abdominal pain and change in bowel habits Musculoskeletal:negative for myalgias Neurological: negative for gait problems and tremors Behavioral/Psych: negative for abusive relationship, depression Endocrine: negative for temperature intolerance   Genitourinary: positive for chronic vaginal yeast with itching Integument/breast: negative for breast lump, breast tenderness, nipple discharge and skin lesion(s)    Objective:       BP 166/89 mmHg  Pulse 92  Temp(Src) 98.1 F (36.7 C)  Wt 186 lb (84.369 kg) General:   alert  Skin:   no rash or abnormalities  Lungs:   clear to auscultation bilaterally  Heart:   regular rate and rhythm, S1, S2 normal, no murmur, click, rub or gallop  Breasts:   normal without  suspicious masses, skin or nipple changes or axillary nodes.  Surgical scars noted left breast.  Abdomen:  normal findings: no organomegaly, soft, non-tender and no hernia  Pelvis:  External genitalia: normal general appearance Urinary system: urethral meatus normal and bladder without fullness, nontender Vaginal: normal without tenderness, induration or masses Cervix: absent Adnexa: normal bimanual exam Uterus: absent   Lab Review Urine pregnancy test Labs reviewed yes Radiologic studies reviewed yes    Assessment:    Healthy Breast CA Survivor  Chronic vaginal yeast   Plan:    Education reviewed: calcium supplements, depression evaluation, low fat, low cholesterol diet, self breast exams and weight bearing exercise. Follow up in: 3 months.   No orders of the defined types were placed in this encounter.   Orders Placed This Encounter  Procedures  . NuSwab Vaginitis (VG)  . POCT urinalysis dipstick

## 2016-03-15 ENCOUNTER — Other Ambulatory Visit: Payer: Self-pay | Admitting: Obstetrics

## 2016-03-15 LAB — NUSWAB VAGINITIS (VG)
CANDIDA GLABRATA, NAA: NEGATIVE
Candida albicans, NAA: NEGATIVE
Trich vag by NAA: NEGATIVE

## 2016-03-28 ENCOUNTER — Encounter: Payer: Self-pay | Admitting: Neurology

## 2016-03-28 ENCOUNTER — Ambulatory Visit (INDEPENDENT_AMBULATORY_CARE_PROVIDER_SITE_OTHER): Payer: Commercial Managed Care - HMO | Admitting: Neurology

## 2016-03-28 VITALS — BP 120/78 | HR 93 | Ht 63.0 in | Wt 188.4 lb

## 2016-03-28 DIAGNOSIS — I10 Essential (primary) hypertension: Secondary | ICD-10-CM | POA: Diagnosis not present

## 2016-03-28 DIAGNOSIS — M545 Low back pain, unspecified: Secondary | ICD-10-CM

## 2016-03-28 DIAGNOSIS — R200 Anesthesia of skin: Secondary | ICD-10-CM

## 2016-03-28 DIAGNOSIS — E785 Hyperlipidemia, unspecified: Secondary | ICD-10-CM | POA: Diagnosis not present

## 2016-03-28 MED ORDER — OXYCODONE-ACETAMINOPHEN 10-325 MG PO TABS: 1.0000 | ORAL_TABLET | Freq: Four times a day (QID) | ORAL | Status: DC | PRN

## 2016-03-28 NOTE — Patient Instructions (Addendum)
-   continue ASA for stroke prevention - discuss with Dr. Damita Dunnings to check lipid panel and decide on statin medications - check BP at home - short term pain medication to bridge with PT - recommend outpt PT therapy for lower back pain - Follow up with your primary care physician for stroke risk factor modification. Recommend maintain blood pressure goal <130/80, diabetes with hemoglobin A1c goal below 6.5% and lipids with LDL cholesterol goal below 70 mg/dL.   - follow up in 6 months.

## 2016-03-28 NOTE — Progress Notes (Addendum)
NEUROLOGY CLINIC FOLLOW UP NOTE  NAME: Kelly Kennedy DOB: Oct 13, 1941  HPI: Kelly Kennedy is a 75 y.o. female with PMH of HTN, DM, HLD who presents as a new patient for recurrent left sided weakness and numbness.   Pt was admitted to [facility] on 09/28/14 for episodic exertional chest pain and palpitations. She also complained of headache and episode of left UE and LE numbness and weakness. Her ECG is abnormal and there are subtle dynamic changes since yesterday - more obvious ST depression and T wave inversion inferolaterally. Borderline long QT. After admission, she had nuclear stress test on 09/29/2014 that was completely normal. EF was 62%. Cardiology suspect afib and recommended 30 day monitoring which done as outpt did not see any episodes of afib. Her outpt 2D echo showed EF 50-55%. A1C 6.9 and LDL 42.   She also underwent TIA work up. MRI and MRA negative. CUS Right: 1-39% ICA stenosis. Vertebral artery flow is antegrade. Left: 40-59% ICA stenosis. She has estradil patch for hot flashes. She was put on ASA and crestor on discharge.   She followed up with Dr. Sallyanne Kennedy and concerning for TIA vs. Seizure due to recurrent left UE and LE numbness and weakness. By further questioning, she stated that she had both LE weakness since 09/2013 when she had fall with LE fracture. However, on 09/28/14, she had bad headache along with chest pain/palpitations as well as left UE and LE weakness and numbness, lasted about 3 hours and resolved. This was the first time she has it. Since then, she has possible 3 times a week left sided numbness and weakness but not as bad as the first one, lasting 10-24min and gone. This episodes seems not associated with HA temporally.   However, she does have HA for the last 2 years after she lost her husband. Initially the headache almost everyday, bilateral behind eyes, she contributes to sinus HA, 8/10, lasting all day, mild photophobia but no N/V, BC powder and sleeping  helps. Later the HA goes twice a week and triggers are stress and depression. She did have bad headache prior admission on 09/28/14 along with left UE and LE numbness weakness, but other times the HA and hemiparesis/hemiparesthesia are basically separated from each other.  She has hx of HTN on lasix, cozaar and her BP today 139/81. She denies DM but her A1C was 6.9 and she is on glipizide and she did not check her glucose at home.   She is using estradil patches for hot flashes. She said she needs it for hot flashes.  Her son had sudden death at age 73 - autopsy -"massive heart attack". No other family history of premature cardiac or vascular illness. She denies smoking, drinking or illicit drugs.   03/18/15 follow up - pt has been doing better. She stated that her left side numbness episode now only 3-4 per month, down significantly from 3-4 per week before. Her previous headache also resolved but she had some daily mild headache due to diclofenac eye drop, but gradually better now. She had EEG done here in 11/2014 showed left frontotemporal area, which is not correlating to her symptoms. Will need EEG repeat. Her BP today is high 170/94 in clinic, she does not check BP or glucose at home.  09/29/15 follow up - she was found to have left breast cancer DCIS in 03/2015, had left lumpectomy in 04/2015 and mastectomy in 07/2015. Currently on tamoxifen for 5 years. Her HA is much better,  very infrequent. She still has intermittent left arm numbness but seems to related to left arm movement after left breast surgery. She contributes it to the surgery. She also has left hand and foot tingling sensation intermittently, lasting 62min, up to 2 per week. She does have DM, but glucose in good control and A1C two months ago was 6.9. She denies right hand or foot tingling though. Her BP 103/64 today in clinic, denies dizziness.   Interval history: During the interval time, pt has been doing well from neuro standpoint.  Still has occasional left hand and foot numbness, but "once a while" and "not so bad" as she stated. Repeat EEG was normal. However, she started to have left LBP recently, and moving to middle, then right and then now back to middle of the lower back. He saw PCP and given flexeril but not effective. She said she was taking percocet before seems effective for pain control. I told her that PT may be helping more. She agrees. BP today 120/78, she has not checked lipid with PCP yet but will see PCP next month.   Past Medical History  Diagnosis Date  . Hyperlipidemia   . Diverticulosis of colon 06/2005  . Hypothyroid   . Insomnia   . Menopausal symptoms   . Hypertension   . Hyperglycemia   . adenomatous Colon polyps     colonoscopy 8/99, 12/02, 8/06  . Pericarditis 1980's  . Chest pain     uses NTG as needed  . Depression   . Bell's palsy   . GERD (gastroesophageal reflux disease)   . H/O hiatal hernia   . Headache(784.0)   . Arthritis   . Irritable bowel   . Diabetes mellitus without complication ([place])   . TIA (transient ischemic attack)     09/2014  . Ductal carcinoma in situ (DCIS) of left breast   . Cancer (HCC)     DCIS left breast  . Family history of adverse reaction to anesthesia     " my mother didn't wake up so they had to put her on life support for about an hour or more; my sisiter has problems waking up too from anesthesia.  . Stroke [facility])    Past Surgical History  Procedure Laterality Date  . Partial hysterectomy      ovaries intact  . Cholecystectomy  1990  . Ectopic pregnancy surgery  1971  . Cystectomy  05/04/08    left lower arm, benign  . Tonsillectomy    . Appendectomy    . Cardiac catheterization    . Fracture surgery      nose  . Tubal ligation    . Orif ankle fracture Left 10/23/2013    Procedure: OPEN REDUCTION INTERNAL FIXATION (ORIF) LEFT ANKLE FRACTURE;  Surgeon: Marianna Payment, MD;  Location: [place];  Service: Orthopedics;  Laterality: Left;  .  Cataract extraction Bilateral 2016  . Partial mastectomy with needle localization and axillary sentinel lymph node bx Left 05/06/2015    Procedure: LEFT BREAST PARTIAL MASTECTOMY WITH NEEDLE LOCALIZATION AND SENTINEL LYMPH NODE MAPPING;  Surgeon: [name], MD;  Location: [place];  Service: General;  Laterality: Left;  . Nipple sparing mastectomy Left 08/06/2015    Procedure: LEFT NIPPLE SPARING MASTECTOMY;  Surgeon: [name], MD;  Location: [place];  Service: General;  Laterality: Left;  . Breast reconstruction with placement of tissue expander and flex hd (acellular hydrated dermis) Left 08/06/2015    Procedure: LEFT BREAST  RECONSTRUCTION WITH PLACEMENT OF SALINE IMPLANT AND ACELLULAR DURMAL MATRIX;  Surgeon: Crissie Reese, MD;  Location: [place];  Service: Plastics;  Laterality: Left;   Family History  Problem Relation Age of Onset  . Cancer Father     stomach   . Heart disease Son     MI  . Arthritis Mother   . Diabetes Mother   . Colon cancer Neg Hx   . Pancreatic cancer Neg Hx   . Stomach cancer Neg Hx   . Breast cancer Neg Hx   . Cancer Brother     lung cancer   Current Outpatient Prescriptions  Medication Sig Dispense Refill  . amitriptyline (ELAVIL) 50 MG tablet TAKE 1-2 TABLETS (50-100 MG TOTAL) BY MOUTH AT BEDTIME AS NEEDED FOR SLEEP. 180 tablet 1  . amLODipine (NORVASC) 5 MG tablet Take 0.5 tablets (2.5 mg total) by mouth daily.    Marland Kitchen aspirin EC 81 MG tablet Take 81 mg by mouth daily.    . Aspirin-Salicylamide-Caffeine (BC HEADACHE POWDER PO) Take 1 Package by mouth daily as needed (pain).     . COMBIGAN 0.2-0.5 % ophthalmic solution PLACE 1 DROP INTO BOTH EYES TWICE A DAY  2  . diclofenac (VOLTAREN) 75 MG EC tablet Take 1 tablet (75 mg total) by mouth 2 (two) times daily. 30 tablet 0  . fluconazole (DIFLUCAN) 200 MG tablet Take 1 tablet (200 mg total) by mouth every other day. 3 tablet 5  . glipiZIDE (GLUCOTROL) 5 MG tablet Take 0.5 tablets (2.5 mg total)  by mouth daily before breakfast. 60 tablet 3  . Hypromellose (GENTEAL OP) Apply 1 application to eye at bedtime.    Marland Kitchen levothyroxine (SYNTHROID, LEVOTHROID) 100 MCG tablet TAKE ONE TABLET BY MOUTH DAILY EXCEPT ON MONDAY AND WEDNESDAY TAKE 1/2 TABLET 90 tablet 3  . losartan (COZAAR) 100 MG tablet Take 100 mg by mouth.    . magnesium oxide (MAG-OX) 400 (241.3 MG) MG tablet TAKE 1 TABLET (400 MG TOTAL) BY MOUTH DAILY. 30 tablet 11  . nitroGLYCERIN (NITROSTAT) 0.4 MG SL tablet PLACE 1 TABLET (0.4 MG TOTAL) UNDER THE TONGUE EVERY 5 (FIVE) MINUTES AS NEEDED FOR CHEST PAIN. 25 tablet 3  . omeprazole (PRILOSEC OTC) 20 MG tablet Take 20 mg by mouth daily.    . Probiotic Product (ADVANCED PROBIOTIC) CAPS Take 1 capsule by mouth every morning. 20 capsule 0  . tamoxifen (NOLVADEX) 20 MG tablet Take 1 tablet (20 mg total) by mouth daily. 30 tablet 6  . terconazole (TERAZOL 7) 0.4 % vaginal cream Place 1 applicator vaginally at bedtime. 45 g 5  . VOLTAREN 1 % GEL Apply 2 g topically 2 (two) times daily as needed (pain).     Marland Kitchen oxyCODONE-acetaminophen (PERCOCET) 10-325 MG tablet Take 1 tablet by mouth every 6 (six) hours as needed for pain. 30 tablet 0   No current facility-administered medications for this visit.   Allergies  Allergen Reactions  . Ace Inhibitors Other (See Comments)    H/o lip swelling with change in losartan pill  . Angiotensin Receptor Blockers Other (See Comments)    H/o lip swelling with change in losartan pill  . Crestor [Rosuvastatin Calcium] Other (See Comments)    HA, aches, intolerant  . Niacin Rash  . Red Dye Rash   Social History   Social History  . Marital Status: Widowed    Spouse Name: [name]  . Number of Children: 3  . Years of Education: [name]   Occupational History  .  retired, previous Engineer, manufacturing systems    Social History Main Topics  . Smoking status: Former Research scientist (life sciences)  . Smokeless tobacco: Former Systems developer  . Alcohol Use: No     Comment: social  . Drug Use: No  . Sexual  Activity: No   Other Topics Concern  . Not on file   Social History Narrative   From [place].   3 children, one died from MI in March 10, 2005   Married 03-11-59   Husband and brother died in the same week in 2012/11/09      Patient is right handed.   Patient has hs education.   Patient drinks 1 cup of soda daily, and green tea.    Review of Systems Full 14 system review of systems performed and notable only for those listed, all others are neg:  Constitutional:   Cardiovascular:  Leg swelling Ear/Nose/Throat: ringing in ears  Skin: moles  Eyes: blurry vision, eye itching, eye redness  Respiratory: [name]  Gastroitestinal: diarrhea  Hematology/Lymphatic: bruise  Endocrine: excessive eating  Musculoskeletal: back pain  Allergy/Immunology: [name]  Neurological: memory loss, HA, numbness, speech difficulty  Psychiatric: depression   Physical Exam  Filed Vitals:   03/28/16 0925  BP: 120/78  Pulse: 93    General - Well nourished, well developed, in no apparent distress.  Ophthalmologic - Sharp disc margins OU.  Cardiovascular - Regular rate and rhythm with no murmur. Carotid pulses were 2+ without bruits .   Neck - supple, no nuchal rigidity .  Mental Status -  Level of arousal and orientation to time, place, and person were intact. Language including expression, naming, repetition, comprehension was assessed and found intact. Fund of Knowledge was assessed and was intact.  Cranial Nerves II - XII - II - Visual field intact OU. III, IV, VI - Extraocular movements intact. V - Facial sensation intact bilaterally. VII - Facial movement intact bilaterally. VIII - Hearing & vestibular intact bilaterally. X - Palate elevates symmetrically. XI - Chin turning & shoulder shrug intact bilaterally. XII - Tongue protrusion intact.  Motor Strength - The patient's strength was normal in all extremities and pronator drift was absent.  Bulk was normal and fasciculations were absent.     Motor Tone - Muscle tone was assessed at the neck and appendages and was normal.  Reflexes - The patient's reflexes were normal in all extremities and she had no pathological reflexes.  Sensory - Light touch, temperature/pinprick and Romberg testing were assessed and were normal.    Coordination - The patient had normal movements in the hands and feet with no ataxia or dysmetria.  Tremor was absent.  Gait and Station - The patient's transfers, posture, gait, station, and turns were observed as normal.   Imaging MRI and MRA head  1. No acute intracranial abnormality. 2. Mild chronic small vessel ischemic disease. 3. No evidence of major intracranial arterial occlusion or significant proximal stenosis.  2D echo - Left ventricle: The cavity size was normal. Wall thickness was normal. Systolic function was normal. The estimated ejection fraction was in the range of 50% to 55%. Wall motion was normal; there were no regional wall motion abnormalities. Doppler parameters are consistent with abnormal left ventricular relaxation (grade 1 diastolic dysfunction).  CUS - 1-39% right ICA and 40-59% left ICA stenosis. Moderate right ECA stemnosis. Both vertebral artery flow is antegrade.  Stress test 09/29/14-  Pre-test: no chest pain or SOB With GXT: target HR reached in stage 1, patient hypertensive so CL injected  and stage held. Pt continued to exercise for 8 minutes more. No chest pain with exertion. + SOB ECG: LVH changes at baseline, inferolateral ST changes deepened with exertion but no ST elevation seen SBP elevated with exertion to 194/87, so pt not pushed to increase exertion. Final report and images pending.   Treadmill Myoview IMPRESSION: 1. No reversible ischemia or infarction. 2. Normal left ventricular wall motion. 3. Left ventricular ejection fraction 62% 4. Low-risk stress test findings.  EEG 12/11/14 - This is an abnormal EEG recording secondary to   intermittent dysrhythmic theta activity and sharp transients  emanating from the left frontotemporal area. This would suggest a  lowered seizure threshold with a left brain focus. This recording  does not correlate well with the clinical events, however.  Clinical correlation is required.  Lab Review Component     Latest Ref Rng 06/27/2014 07/04/2014 09/29/2014 10/06/2014  Cholesterol     0 - 200 mg/dL 143  120   Triglycerides     <150 mg/dL 243.0 (H)  110   HDL     >39 mg/dL 52.10  56   VLDL     0 - 40 mg/dL 48.6 (H)  22   Total CHOL/HDL Ratio      3  2.1   NonHDL      90.90     LDL (calc)     0 - 99 mg/dL   42   Hgb A1c MFr Bld     4.6 - 6.5 %  6.9 (H) 6.8 (H) 6.9 (H)  Mean Plasma Glucose     <117 mg/dL   148 (H)   TSH     0.35 - 4.50 uIU/mL 0.65     D-Dimer, Quant     0.00 - 0.48 ug/mL-FEU   0.48      Assessment:   In summary, Kelly Kennedy is a 75 y.o. female with PMH of HTN, DM, HLD and headache follow up in clinic for episodic left sided numbness. She had episode of chest pain, palpitation, and headache. Cardiac work up so far negative. Pt also has episodes of transient left UE and LE numbness and weakness, lasting 10-45min with or without headache. Stroke work up also negative so far, including MRI and MRA, 2D echo, CUS, 30 day cardiac monitoring and LDL. Her A1C was mildly high at 6.9 and she is on estrogen patch for hot flashes. DDx include TIA, focal seizure and complicated migraine. She is on ASA and crestor for stroke prevention. EEG showed left frontotemporal slow and sharp transients which can not relate to her left sided symptoms. During interval time, she still has intermittent left hand and foot tingling, may related to diabetic neuropathy but she denies right hand or foot tingling. Her headache is nearly gone. She was diagnosed with left breast cancer DCIS s/p mastectomy and on tamoxifen. She stated left arm numbness with movement after the breast surgery.  Crestor currently off due to SE with generic form crestor. Repeat EEG was normal. Complains of LBP now, will refer to PT with percocet bridge.  Plan: - continue ASA for stroke prevention - discuss with Dr. Damita Dunnings to check lipid panel and decide on statin medications - check BP at home - short term percocet to bridge with PT - recommend outpt PT therapy for LBP - Follow up with your primary care physician for stroke risk factor modification. Recommend maintain blood pressure goal <130/80, diabetes with hemoglobin A1c goal below 6.5% and lipids with LDL  cholesterol goal below 70 mg/dL.   - follow up in 6 months.  I spent more than 25 minutes of face to face time with the patient. Greater than 50% of time was spent in counseling and coordination of care. We have discussed about EEG result, follow up with PCP for lipid panel and LBP treatment as well as pain control.    Orders Placed This Encounter  Procedures  . Ambulatory referral to Physical Therapy    Referral Priority:  Routine    Referral Type:  Physical Medicine    Referral Reason:  Specialty Services Required    Requested Specialty:  Physical Therapy    Number of Visits Requested:  1    Meds ordered this encounter  Medications  . oxyCODONE-acetaminophen (PERCOCET) 10-325 MG tablet    Sig: Take 1 tablet by mouth every 6 (six) hours as needed for pain.    Dispense:  30 tablet    Refill:  0    Patient Instructions  - continue ASA for stroke prevention - discuss with Dr. Damita Dunnings to check lipid panel and decide on statin medications - check BP at home - short term pain medication to bridge with PT - recommend outpt PT therapy for lower back pain - Follow up with your primary care physician for stroke risk factor modification. Recommend maintain blood pressure goal <130/80, diabetes with hemoglobin A1c goal below 6.5% and lipids with LDL cholesterol goal below 70 mg/dL.   - follow up in 6 months.    Rosalin Hawking, MD  PhD [facility] [address] [place], [place] [address] [phone]

## 2016-04-05 ENCOUNTER — Other Ambulatory Visit: Payer: Self-pay | Admitting: Nurse Practitioner

## 2016-04-08 ENCOUNTER — Ambulatory Visit: Payer: Commercial Managed Care - HMO | Attending: Neurology | Admitting: Physical Therapy

## 2016-04-08 ENCOUNTER — Encounter: Payer: Self-pay | Admitting: Physical Therapy

## 2016-04-08 DIAGNOSIS — M6281 Muscle weakness (generalized): Secondary | ICD-10-CM | POA: Diagnosis not present

## 2016-04-08 DIAGNOSIS — M545 Low back pain, unspecified: Secondary | ICD-10-CM

## 2016-04-08 NOTE — Therapy (Signed)
[facility] [address] [place], [place], [address] Phone: [phone]   Fax:  [phone]  Physical Therapy Evaluation  Patient Details  Name: Kelly Kennedy MRN: [id number] Date of Birth: 11-01-1941 Referring Provider: Rosalin Hawking, MD  Encounter Date: 04/08/2016      PT End of Session - 04/08/16 1217    Visit Number 1   Number of Visits 5   Date for PT Re-Evaluation 05/08/16   Authorization Type [employer]   Authorization Time Period G codes required    PT Start Time 1020   PT Stop Time 1102   PT Time Calculation (min) 42 min   Activity Tolerance Patient tolerated treatment well   Behavior During Therapy [facility] for tasks assessed/performed      Past Medical History  Diagnosis Date  . Hyperlipidemia   . Diverticulosis of colon 06/2005  . Hypothyroid   . Insomnia   . Menopausal symptoms   . Hypertension   . Hyperglycemia   . adenomatous Colon polyps     colonoscopy 8/99, 12/02, 8/06  . Pericarditis 1980's  . Chest pain     uses NTG as needed  . Depression   . Bell's palsy   . GERD (gastroesophageal reflux disease)   . H/O hiatal hernia   . Headache(784.0)   . Arthritis   . Irritable bowel   . Diabetes mellitus without complication ([place])   . TIA (transient ischemic attack)     09/2014  . Ductal carcinoma in situ (DCIS) of left breast   . Cancer (HCC)     DCIS left breast  . Family history of adverse reaction to anesthesia     " my mother didn't wake up so they had to put her on life support for about an hour or more; my sisiter has problems waking up too from anesthesia.  . Stroke [facility])     Past Surgical History  Procedure Laterality Date  . Partial hysterectomy      ovaries intact  . Cholecystectomy  1990  . Ectopic pregnancy surgery  1971  . Cystectomy  05/04/08    left lower arm, benign  . Tonsillectomy    . Appendectomy    . Cardiac catheterization    . Fracture surgery      nose  .  Tubal ligation    . Orif ankle fracture Left 10/23/2013    Procedure: OPEN REDUCTION INTERNAL FIXATION (ORIF) LEFT ANKLE FRACTURE;  Surgeon: Marianna Payment, MD;  Location: [place];  Service: Orthopedics;  Laterality: Left;  . Cataract extraction Bilateral 2016  . Partial mastectomy with needle localization and axillary sentinel lymph node bx Left 05/06/2015    Procedure: LEFT BREAST PARTIAL MASTECTOMY WITH NEEDLE LOCALIZATION AND SENTINEL LYMPH NODE MAPPING;  Surgeon: [name], MD;  Location: [place];  Service: General;  Laterality: Left;  . Nipple sparing mastectomy Left 08/06/2015    Procedure: LEFT NIPPLE SPARING MASTECTOMY;  Surgeon: [name], MD;  Location: [place];  Service: General;  Laterality: Left;  . Breast reconstruction with placement of tissue expander and flex hd (acellular hydrated dermis) Left 08/06/2015    Procedure: LEFT BREAST RECONSTRUCTION WITH PLACEMENT OF SALINE IMPLANT AND ACELLULAR DURMAL MATRIX;  Surgeon: Crissie Reese, MD;  Location: [place];  Service: Plastics;  Laterality: Left;    There were no vitals filed for this visit.       Subjective Assessment - 04/08/16 1026    Subjective Likes to be  called [name], jumps around back will move from L to R sides, more pain on the R today, pain began about a month ago. No causative factor, that pt recalls. No radicular pain. No bowel changes; unsure if bladder changes related to back pain.    Pertinent History DM 2, breast cancer, hypthroidism, GERD, hernia, diverticulosis, insomia, edema, osteopenia, L ankle fx, HTN, TIA, depression    Limitations Sitting  able to sit for approx 1 hour   How long can you sit comfortably? one hour   Patient Stated Goals "Ease the pain some."   Currently in Pain? Yes   Pain Score 8   best: 6/10 when standing, using gel and taking pain meds   Pain Location Back   Pain Orientation Right;Lower   Pain Descriptors / Indicators Aching   Pain Type Chronic pain   Pain  Onset More than a month ago  02/20/16   Pain Frequency Constant   Aggravating Factors  sitting   Pain Relieving Factors standing, voltaren gel, pain medication   Multiple Pain Sites No            [facility] PT Assessment - 04/08/16 0001    Assessment   Medical Diagnosis Left-sided low back pain without sciatica   Referring Provider Rosalin Hawking, MD   Onset Date/Surgical Date 02/20/16   Precautions   Precautions Other (comment)   Precaution Comments BP on L arm only; no lifting with R arm until told otherwise by oncologist   Restrictions   Weight Bearing Restrictions No   Balance Screen   Has the patient fallen in the past 6 months Yes  Pt wished to focus PT on back pain.   How many times? 0  several near-misses   Has the patient had a decrease in activity level because of a fear of falling?  Yes   Is the patient reluctant to leave their home because of a fear of falling?  No   Home Ecologist residence   Living Arrangements Other (Comment)  sister   Type of [place] to enter   Entrance Stairs-Number of Steps 2   Entrance Stairs-Rails None   Home Layout One level   [place] - single point;Walker - 4 wheels   Additional Comments Uses SPC sometimes; no difficulty with stair negotiation   Prior Function   Level of Independence Requires assistive device for independence  has used cane off and on since 2014   Vocation Retired   Leisure Pt likes to go fishing, be outdoors, [employer] yard.   Cognition   Overall Cognitive Status Within Functional Limits for tasks assessed   Sensation   Light Touch Appears Intact   Posture/Postural Control   Posture/Postural Control Postural limitations   Postural Limitations Forward head;Rounded Shoulders;Decreased lumbar lordosis;Posterior pelvic tilt;Weight shift left   ROM / Strength   AROM / PROM / Strength AROM;Strength   AROM   Overall AROM  Due to pain   Overall AROM Comments pain  with lumbar extension and left SB, decreased pain with lumber flexion, bilateral rotation   Strength   Strength Assessment Site Hip;Knee;Ankle   Right/Left Hip Right;Left   Right Hip Flexion 4/5   Right Hip Extension 3+/5  Glut: 3+/5 Ham: 4/5   Right Hip ABduction 3-/5   Left Hip Flexion 5/5   Left Hip Extension 4-/5  glut: 4-/5 Ham: 4-/5   Left Hip ABduction 3-/5   Right/Left Knee Right;Left  Right/Left Ankle Right;Left   Right Ankle Dorsiflexion --   Right Ankle Plantar Flexion --   Left Ankle Dorsiflexion --   Left Ankle Plantar Flexion --   Special Tests    Special Tests Sacrolliac Tests   Sacroiliac Tests  Pelvic Distraction   Pelvic Dictraction   Findings Negative   Pelvic Compression   Findings Positive   Side Right   Sacral thrust    Findings Positive   Side Right   Gaenslen's test   Findings Positive   Side  Right   Transfers   Transfers Sit to Stand;Stand to Sit   Ambulation/Gait   Ambulation/Gait Yes   Ambulation/Gait Assistance 7: Independent;6: Modified independent (Device/Increase time)   Ambulation Distance (Feet) 50 Feet   Gait Pattern Decreased stride length;Decreased weight shift to right   Ambulation Surface Level                           PT Education - 04/08/16 1216    Education provided Yes   Education Details Educated on SI joint anatomy and positive findings during evaluation.    Person(s) Educated Patient   Methods Explanation   Comprehension Verbalized understanding          PT Short Term Goals - 04/08/16 1230    PT SHORT TERM GOAL #1   Title STG= LTG   PT SHORT TERM GOAL #2   Title --           PT Long Term Goals - 04/08/16 1231    PT LONG TERM GOAL #1   Title Pt will report tolerance of sitting for greater than 1 hour with less than 4/5 pain in 4 weeks (05/08/16)   Status New   PT LONG TERM GOAL #2   Title Pt wil report low back pain less than or equal to 4/10 in 4 weeks (05/08/16)   Status New    PT LONG TERM GOAL #3   Title Pt will be I with HEP as demostrated in the clinic in 4 weeks ( 04/1116)   PT LONG TERM GOAL #4   Title Pt will be improve ODI from 34% to 22% in 4 weeks  (05/08/16)               Plan - 04/08/16 1219    Clinical Impression Statement Pt is a 75 yr old female here for diffused, achey low back pain that began about 1 month ago. Pain limits sitting for more than one hour at a time. PMH: DM 2, breast cancer, hypthroidism, GERD, hernia, diverticulosis, insomia, edema, osteopenia, L ankle fx, HTN, TIA, depression. Signs and symptoms consent with dysfunction of SI joint per pain provoked with  with extension and rotation of lumber spine, SI provaction testing, PIVM extension of lumber spine. Pain is limiting patient functioni in the home and in community. Skilled PT is needed for core stabilization to increase mobility and function.    Rehab Potential Good   PT Frequency 1x / week   PT Duration 4 weeks   PT Treatment/Interventions Gait training;Stair training;Functional mobility training;Therapeutic activities;Therapeutic exercise;Balance training;Neuromuscular re-education;Patient/family education;Manual techniques;Energy conservation   PT Next Visit Plan progress with core strengthening exercise, MET for SI rotation   PT Home Exercise Plan TA contractions and posterior pelvic tilts   Consulted and Agree with Plan of Care Patient      Patient will benefit from skilled therapeutic intervention in order to improve the following deficits and  impairments:  Decreased activity tolerance, Decreased mobility, Decreased strength, Pain, Hypermobility, Postural dysfunction  Visit Diagnosis: Right-sided low back pain without sciatica - Plan: PT plan of care cert/re-cert  Muscle weakness (generalized) - Plan: PT plan of care cert/re-cert      G-Codes - 41/74/08 1314    Functional Assessment Tool Used Oswestry = 34%   Functional Limitation Self care   Self Care Current  Status (X4481) At least 20 percent but less than 40 percent impaired, limited or restricted   Self Care Goal Status (E5631) At least 20 percent but less than 40 percent impaired, limited or restricted       Problem List Patient Active Problem List   Diagnosis Date Noted  . Left low back pain 02/23/2016  . Diarrhea 11/12/2015  . Thrush 08/26/2015  . Neoplasm of left breast, primary tumor staging category Tis: ductal carcinoma in situ (DCIS) 08/06/2015  . Breast cancer, female ([place]) 08/06/2015  . Lip swelling 07/21/2015  . Breast cancer of upper-outer quadrant of left female breast ([place]) 06/10/2015  . Breast cancer ([place]) 05/14/2015  . Pain in joint, shoulder region 01/11/2015  . Left sided numbness 12/11/2014  . HLD (hyperlipidemia) 12/11/2014  . Type 2 diabetes mellitus with other circulatory complications ([place]) 49/70/2637  . Vaginitis and vulvovaginitis 10/10/2014  . Paresthesia 09/28/2014  . Essential hypertension 09/28/2014  . TIA (transient ischemic attack) 09/28/2014  . Osteopenia 07/09/2014  . Advance care planning 07/06/2014  . Ankle fracture, left 10/23/2013  . Obesity, unspecified 07/03/2013  . Anxiety state, unspecified 07/03/2013  . Medicare annual wellness visit, subsequent 05/27/2012  . History of bone density study 05/20/2011  . MENOPAUSAL SYNDROME 06/15/2010  . INSOMNIA 06/15/2010  . GERD 09/16/2008  . HYPOTHYROIDISM 08/02/2007  . DEPENDENT EDEMA 08/02/2007  . SYSTOLIC MURMUR 85/88/5027  . ANGINA, HX OF 07/17/2007  . NICOTINE ADDICTION, IN REMISSION 07/17/2007  . HIATAL HERNIA 06/28/2005  . DIVERTICULOSIS, COLON W/O HEM 06/28/2005  . HEMORRHOIDS NOS, W/O COMPLICATIONS 74/10/8785    Dillard Essex, SPT 04/08/2016, 3:43 PM  [facility] [address] [place], [place], [address] Phone: [phone]   Fax:  [phone]  Name: Kelly Kennedy MRN: [id number] Date of Birth: 03/29/1941   Edits  made by Billie Ruddy, PT, [facility] [address] [place], [place], [address] Phone: [phone]   Fax:  [phone] 04/08/2016, 3:51 PM

## 2016-04-11 ENCOUNTER — Ambulatory Visit: Payer: Commercial Managed Care - HMO | Admitting: Physical Therapy

## 2016-04-11 DIAGNOSIS — M6281 Muscle weakness (generalized): Secondary | ICD-10-CM | POA: Diagnosis not present

## 2016-04-11 DIAGNOSIS — M545 Low back pain, unspecified: Secondary | ICD-10-CM

## 2016-04-11 NOTE — Therapy (Signed)
[facility] [address] [place], [place], [address] Phone: [phone]   Fax:  [phone]  Physical Therapy Treatment  Patient Details  Name: Kelly Kennedy MRN: [id number] Date of Birth: 1941/08/29 Referring Provider: Rosalin Hawking, MD  Encounter Date: 04/11/2016      PT End of Session - 04/11/16 0949    Visit Number 2   Number of Visits 5   Date for PT Re-Evaluation 05/08/16   Authorization Type [employer]   Authorization Time Period G codes required    PT Start Time 0802   PT Stop Time 0844   PT Time Calculation (min) 42 min   Activity Tolerance Patient tolerated treatment well   Behavior During Therapy [facility] for tasks assessed/performed      Past Medical History  Diagnosis Date  . Hyperlipidemia   . Diverticulosis of colon 06/2005  . Hypothyroid   . Insomnia   . Menopausal symptoms   . Hypertension   . Hyperglycemia   . adenomatous Colon polyps     colonoscopy 8/99, 12/02, 8/06  . Pericarditis 1980's  . Chest pain     uses NTG as needed  . Depression   . Bell's palsy   . GERD (gastroesophageal reflux disease)   . H/O hiatal hernia   . Headache(784.0)   . Arthritis   . Irritable bowel   . Diabetes mellitus without complication ([place])   . TIA (transient ischemic attack)     09/2014  . Ductal carcinoma in situ (DCIS) of left breast   . Cancer (HCC)     DCIS left breast  . Family history of adverse reaction to anesthesia     " my mother didn't wake up so they had to put her on life support for about an hour or more; my sisiter has problems waking up too from anesthesia.  . Stroke [facility])     Past Surgical History  Procedure Laterality Date  . Partial hysterectomy      ovaries intact  . Cholecystectomy  1990  . Ectopic pregnancy surgery  1971  . Cystectomy  05/04/08    left lower arm, benign  . Tonsillectomy    . Appendectomy    . Cardiac catheterization    . Fracture surgery      nose  .  Tubal ligation    . Orif ankle fracture Left 10/23/2013    Procedure: OPEN REDUCTION INTERNAL FIXATION (ORIF) LEFT ANKLE FRACTURE;  Surgeon: Marianna Payment, MD;  Location: [place];  Service: Orthopedics;  Laterality: Left;  . Cataract extraction Bilateral 2016  . Partial mastectomy with needle localization and axillary sentinel lymph node bx Left 05/06/2015    Procedure: LEFT BREAST PARTIAL MASTECTOMY WITH NEEDLE LOCALIZATION AND SENTINEL LYMPH NODE MAPPING;  Surgeon: [name], MD;  Location: [place];  Service: General;  Laterality: Left;  . Nipple sparing mastectomy Left 08/06/2015    Procedure: LEFT NIPPLE SPARING MASTECTOMY;  Surgeon: [name], MD;  Location: [place];  Service: General;  Laterality: Left;  . Breast reconstruction with placement of tissue expander and flex hd (acellular hydrated dermis) Left 08/06/2015    Procedure: LEFT BREAST RECONSTRUCTION WITH PLACEMENT OF SALINE IMPLANT AND ACELLULAR DURMAL MATRIX;  Surgeon: Crissie Reese, MD;  Location: [place];  Service: Plastics;  Laterality: Left;    There were no vitals filed for this visit.      Subjective Assessment - 04/11/16 0805    Subjective "It's better today."  Pertinent History Likes to be called "Kelly Kennedy". PMH signficiant for: DM 2, breast cancer, hypthroidism, GERD, hernia, diverticulosis, insomia, edema, osteopenia, L ankle fx, HTN, TIA, depression    How long can you sit comfortably? one hour   Patient Stated Goals "Ease the pain some."   Currently in Pain? Yes   Pain Score 5    Pain Location Back   Pain Orientation Right;Lower   Pain Descriptors / Indicators Aching   Pain Type Chronic pain   Pain Onset More than a month ago   Pain Frequency Constant   Aggravating Factors  sitting   Pain Relieving Factors standing, voltaren gel, pain medication   Multiple Pain Sites No                         [facility] Adult PT Treatment/Exercise - 04/11/16 0001    Exercises   Exercises  Other Exercises;Lumbar;Knee/Hip   Lumbar Exercises: Standing   Other Standing Lumbar Exercises Standing without UE support, sidestepping against resistance of green Tband (for lumbar multifidi strengthening) 5 steps per direction, x5 reps on BLE's. Cueing for technique, alignment wiht effective return demo.   Lumbar Exercises: Supine   Ab Set 10 reps;5 seconds   AB Set Limitations TA activation   Heel Slides 15 reps   Bent Knee Raise 20 reps   Bent Knee Raise Limitations ipsilateral UE/LE raise   Dead Bug 15 reps   Lumbar Exercises: Sidelying   Clam 20 reps;2 seconds   Clam Limitations per side   Lumbar Exercises: Prone   Straight Leg Raise 15 reps;10 reps   Straight Leg Raises Limitations x10 on R, x15 on L (to pt fatigue); for hamstring strengthening and multifidi contraction   Knee/Hip Exercises: Standing   Hip ADduction Strengthening;Both;2 sets;10 reps   Hip ADduction Limitations resisted by green Tband with cueing for alignment/technique; BUE support   Hip Extension Stengthening;Both;15 reps;Knee straight;1 set   Extension Limitations resisted by green Tband; single UE support                PT Education - 04/11/16 0949    Education provided Yes   Education Details Expanded on HEP; see Pt Instructions.    Person(s) Educated Patient   Methods Explanation;Demonstration;Handout;Verbal cues   Comprehension Verbalized understanding;Returned demonstration          PT Short Term Goals - 04/08/16 1230    PT SHORT TERM GOAL #1   Title STG= LTG   PT SHORT TERM GOAL #2   Title --           PT Long Term Goals - 04/08/16 1231    PT LONG TERM GOAL #1   Title Pt will report tolerance of sitting for greater than 1 hour with less than 4/5 pain in 4 weeks (05/08/16)   Status New   PT LONG TERM GOAL #2   Title Pt wil report low back pain less than or equal to 4/5 in 4 weeks (05/08/16)   Status New   PT LONG TERM GOAL #3   Title Pt will be I with HEP as demostrated in  the clinic in 4 weeks ( 04/1116)   PT LONG TERM GOAL #4   Title Pt will be improve ODI from 34% to 22% in 4 weeks  (05/08/16)               Plan - 04/11/16 0950    Clinical Impression Statement Session focused on progressing/expanding on current   HEP and continued strengthening of B hip extensors/abductors and core stabilization musculature. Pt tolerated all interventions within inrcease in pain.   Rehab Potential Good   PT Frequency 1x / week   PT Duration 4 weeks   PT Treatment/Interventions Gait training;Stair training;Functional mobility training;Therapeutic activities;Therapeutic exercise;Balance training;Neuromuscular re-education;Patient/family education;Manual techniques;Energy conservation   PT Next Visit Plan Continue core stabilization (marching on physioball, etc) and strengthening B hips. Consider MET for SI rotation, if pt continues to c/o pain.   Consulted and Agree with Plan of Care Patient      Patient will benefit from skilled therapeutic intervention in order to improve the following deficits and impairments:  Decreased activity tolerance, Decreased mobility, Decreased strength, Pain, Hypermobility, Postural dysfunction  Visit Diagnosis: Right-sided low back pain without sciatica  Muscle weakness (generalized)     Problem List Patient Active Problem List   Diagnosis Date Noted  . Left low back pain 02/23/2016  . Diarrhea 11/12/2015  . Thrush 08/26/2015  . Neoplasm of left breast, primary tumor staging category Tis: ductal carcinoma in situ (DCIS) 08/06/2015  . Breast cancer, female ([place]) 08/06/2015  . Lip swelling 07/21/2015  . Breast cancer of upper-outer quadrant of left female breast ([place]) 06/10/2015  . Breast cancer ([place]) 05/14/2015  . Pain in joint, shoulder region 01/11/2015  . Left sided numbness 12/11/2014  . HLD (hyperlipidemia) 12/11/2014  . Type 2 diabetes mellitus with other circulatory complications ([place]) 74/25/9563  . Vaginitis and  vulvovaginitis 10/10/2014  . Paresthesia 09/28/2014  . Essential hypertension 09/28/2014  . TIA (transient ischemic attack) 09/28/2014  . Osteopenia 07/09/2014  . Advance care planning 07/06/2014  . Ankle fracture, left 10/23/2013  . Obesity, unspecified 07/03/2013  . Anxiety state, unspecified 07/03/2013  . Medicare annual wellness visit, subsequent 05/27/2012  . History of bone density study 05/20/2011  . MENOPAUSAL SYNDROME 06/15/2010  . INSOMNIA 06/15/2010  . GERD 09/16/2008  . HYPOTHYROIDISM 08/02/2007  . DEPENDENT EDEMA 08/02/2007  . SYSTOLIC MURMUR 87/56/4332  . ANGINA, HX OF 07/17/2007  . NICOTINE ADDICTION, IN REMISSION 07/17/2007  . HIATAL HERNIA 06/28/2005  . DIVERTICULOSIS, COLON W/O HEM 06/28/2005  . HEMORRHOIDS NOS, W/O COMPLICATIONS 95/18/8416    Billie Ruddy, PT, DPT [facility] [address] [place], [place], [address] Phone: [phone]   Fax:  [phone] 04/11/2016, 9:53 AM  Name: YULONDA WHEELING MRN: [id number] Date of Birth: [birth year]

## 2016-04-11 NOTE — Patient Instructions (Signed)
Bracing With Leg March (Hook-Lying)    With neutral spine, pull belly button toward spine. While holding this muscle contraction: lift right foot _4-6__ inches and return to floor. Continue alternating leg lifts with concurrent abdominal bracing, repeating _20__ times. Do _2-3__ times a day.   ABDUCTION: Standing - Resistance Band (Active)    Sit down to place GREEN  Band on ankle; anchor other end to stable surface. Stand, feet flat. Hold on with two hands. Against GREEN resistance band, lift right leg out to side. Slowly return to resting position. You should feel this in the side/back of your hip. Repeat 10 reps, 2-3 times per day.  Balance, Proprioception: Hip Extension With Tubing    Sit down to place GREEN  Band on ankle; anchor other end to stable surface. Stand, feet flat. Hold on with one hand. Against GREEN resistance band, lift straight leg backwards. Slowly return to resting position. You should feel this in the back of your hip. Repeat 15 reps, 2-3 times per day.

## 2016-04-12 ENCOUNTER — Encounter: Payer: Self-pay | Admitting: Family Medicine

## 2016-04-12 ENCOUNTER — Ambulatory Visit (INDEPENDENT_AMBULATORY_CARE_PROVIDER_SITE_OTHER): Payer: Commercial Managed Care - HMO | Admitting: Family Medicine

## 2016-04-12 VITALS — BP 116/72 | HR 68 | Temp 97.8°F | Wt 187.0 lb

## 2016-04-12 DIAGNOSIS — I10 Essential (primary) hypertension: Secondary | ICD-10-CM | POA: Diagnosis not present

## 2016-04-12 DIAGNOSIS — E119 Type 2 diabetes mellitus without complications: Secondary | ICD-10-CM | POA: Diagnosis not present

## 2016-04-12 DIAGNOSIS — E1159 Type 2 diabetes mellitus with other circulatory complications: Secondary | ICD-10-CM

## 2016-04-12 DIAGNOSIS — N760 Acute vaginitis: Secondary | ICD-10-CM | POA: Diagnosis not present

## 2016-04-12 DIAGNOSIS — E785 Hyperlipidemia, unspecified: Secondary | ICD-10-CM

## 2016-04-12 MED ORDER — AMLODIPINE BESYLATE 5 MG PO TABS: 5.0000 mg | ORAL_TABLET | Freq: Every day | ORAL | Status: DC

## 2016-04-12 NOTE — Patient Instructions (Addendum)
Go to the lab on the way out.  We'll contact you with your lab report. I'll check on the cholesterol medicine in the meantime.  Take care.  Glad to see you.

## 2016-04-12 NOTE — Progress Notes (Signed)
Pre visit review using our clinic review tool, if applicable. No additional management support is needed unless otherwise documented below in the visit note.  DM2, d/w pt about goal A1c.  No sx of low sugars.  Due for A1c.  Compliant with med.  See notes on labs.    Hypertension:    Using medication without problems or lightheadedness: rarely lightheaded.   Chest pain with exertion:no Edema: at baseline, not worse than normal  Short of breath:no BP controlled today.   Still with intermittent vaginitis.  Some better with prn tx of diflucan.  She thinks (and think she is correct) that the vaginitis sx are related to her prev/ongoing CA treatment.  She is okay "getting by" with intermittent tx.  She has seen GYN in the meantime.    Off statin.  HLD noted.  She was changed from one version of crestor to another generic and couldn't tolerate the change.  She did well on Astrazeneca form but couldn't tolerate the Mylan from.  She is willing to retry the Astrazeneca form again.  D/w pt about rationale for meds and her targets for labs.    PMH and SH reviewed  ROS: Per HPI unless specifically indicated in ROS section   Meds, vitals, and allergies reviewed.   GEN: nad, alert and oriented HEENT: mucous membranes moist NECK: supple w/o LA CV: rrr.  no murmur PULM: ctab, no inc wob ABD: soft, +bs EXT: no edema

## 2016-04-13 ENCOUNTER — Encounter: Payer: Self-pay | Admitting: Family Medicine

## 2016-04-13 ENCOUNTER — Telehealth: Payer: Self-pay | Admitting: Family Medicine

## 2016-04-13 LAB — HEMOGLOBIN A1C: Hgb A1c MFr Bld: 6.8 % — ABNORMAL HIGH (ref 4.6–6.5)

## 2016-04-13 MED ORDER — ROSUVASTATIN CALCIUM 10 MG PO TABS: 10.0000 mg | ORAL_TABLET | Freq: Every day | ORAL | Status: DC

## 2016-04-13 NOTE — Telephone Encounter (Signed)
Thanks

## 2016-04-13 NOTE — Assessment & Plan Note (Signed)
She did well on Astrazeneca form but couldn't tolerate the Mylan from for crestor. She is willing to retry the Astrazeneca form again.  We'll work on getting that set up.  We didn't recheck lipids today as it wouldn't change mgmt.  >25 minutes spent in face to face time with patient, >50% spent in counselling or coordination of care.

## 2016-04-13 NOTE — Telephone Encounter (Signed)
Pharmacist Adonis Huguenin) notified as instructed by telephone and verbalized understanding. Was advised by pharmacist that a prior authorization is required on this change  Pharmacist stated that she will fax over the paperwork regarding the PA.

## 2016-04-13 NOTE — Assessment & Plan Note (Signed)
Given her age and lack of low sugars, with A1c <7, I would not push her A1c lower with more meds and risk hypoglycemia.  I realize that her "textbook" target is <6.5 but given her age her current A1c is acceptable.  D/w pt.  She agrees.  She isn't enthused about taking more meds.

## 2016-04-13 NOTE — Assessment & Plan Note (Signed)
Continue prn tx.

## 2016-04-13 NOTE — Assessment & Plan Note (Signed)
Controlled, continue as is.  

## 2016-04-13 NOTE — Telephone Encounter (Signed)
Please call pharmacy re: crestor.   This is an atypical but legit request.  She did well on Astrazen[employer] form but couldn't tolerate the Mylan from. She is willing to retry the Astrazeneca form again.  See sig on rx.  Thanks.

## 2016-04-14 ENCOUNTER — Telehealth: Payer: Self-pay | Admitting: *Deleted

## 2016-04-14 NOTE — Telephone Encounter (Signed)
PA submitted thru CMM for Crestor, awaiting response.

## 2016-04-19 ENCOUNTER — Ambulatory Visit: Payer: Commercial Managed Care - HMO | Admitting: Physical Therapy

## 2016-04-19 DIAGNOSIS — M545 Low back pain, unspecified: Secondary | ICD-10-CM

## 2016-04-19 DIAGNOSIS — M6281 Muscle weakness (generalized): Secondary | ICD-10-CM

## 2016-04-19 NOTE — Therapy (Signed)
[facility] [address] [place], [place], [address] Phone: [phone]   Fax:  [phone]  Physical Therapy Treatment  Patient Details  Name: Kelly Kennedy MRN: [id number] Date of Birth: [birth year] Referring Provider: Rosalin Hawking, MD  Encounter Date: 04/19/2016      PT End of Session - 04/19/16 1420    Visit Number 3   Number of Visits 5   Date for PT Re-Evaluation 05/08/16   Authorization Type [employer]   Authorization Time Period G codes required    PT Start Time 1319   PT Stop Time 1402   PT Time Calculation (min) 43 min   Activity Tolerance Patient tolerated treatment well   Behavior During Therapy [facility] for tasks assessed/performed      Past Medical History  Diagnosis Date  . Hyperlipidemia   . Diverticulosis of colon 06/2005  . Hypothyroid   . Insomnia   . Menopausal symptoms   . Hypertension   . Hyperglycemia   . adenomatous Colon polyps     colonoscopy 8/99, 12/02, 8/06  . Pericarditis 1980's  . Chest pain     uses NTG as needed  . Depression   . Bell's palsy   . GERD (gastroesophageal reflux disease)   . H/O hiatal hernia   . Headache(784.0)   . Arthritis   . Irritable bowel   . Diabetes mellitus without complication ([place])   . TIA (transient ischemic attack)     09/2014  . Ductal carcinoma in situ (DCIS) of left breast   . Cancer (HCC)     DCIS left breast  . Family history of adverse reaction to anesthesia     " my mother didn't wake up so they had to put her on life support for about an hour or more; my sisiter has problems waking up too from anesthesia.  . Stroke [facility])     Past Surgical History  Procedure Laterality Date  . Partial hysterectomy      ovaries intact  . Cholecystectomy  1990  . Ectopic pregnancy surgery  1971  . Cystectomy  05/04/08    left lower arm, benign  . Tonsillectomy    . Appendectomy    . Cardiac catheterization    . Fracture surgery      nose  .  Tubal ligation    . Orif ankle fracture Left 10/23/2013    Procedure: OPEN REDUCTION INTERNAL FIXATION (ORIF) LEFT ANKLE FRACTURE;  Surgeon: Marianna Payment, MD;  Location: [place];  Service: Orthopedics;  Laterality: Left;  . Cataract extraction Bilateral 2016  . Partial mastectomy with needle localization and axillary sentinel lymph node bx Left 05/06/2015    Procedure: LEFT BREAST PARTIAL MASTECTOMY WITH NEEDLE LOCALIZATION AND SENTINEL LYMPH NODE MAPPING;  Surgeon: [name], MD;  Location: [place];  Service: General;  Laterality: Left;  . Nipple sparing mastectomy Left 08/06/2015    Procedure: LEFT NIPPLE SPARING MASTECTOMY;  Surgeon: [name], MD;  Location: [place];  Service: General;  Laterality: Left;  . Breast reconstruction with placement of tissue expander and flex hd (acellular hydrated dermis) Left 08/06/2015    Procedure: LEFT BREAST RECONSTRUCTION WITH PLACEMENT OF SALINE IMPLANT AND ACELLULAR DURMAL MATRIX;  Surgeon: Crissie Reese, MD;  Location: [place];  Service: Plastics;  Laterality: Left;    There were no vitals filed for this visit.      Subjective Assessment - 04/19/16 1323    Subjective Pt reports increased pain  when performing home exercises. However, pain has improved overall (from 6/10 last session to 2/10 today, at rest).   Pertinent History Likes to be called "[name]". PMH signficiant for: DM 2, breast cancer, hypthroidism, GERD, hernia, diverticulosis, insomia, edema, osteopenia, L ankle fx, HTN, TIA, depression    Limitations Sitting   How long can you sit comfortably? one hour   Patient Stated Goals "Ease the pain some."   Currently in Pain? Yes   Pain Score 2    Pain Location Back   Pain Orientation Right   Pain Descriptors / Indicators Aching   Pain Radiating Towards into R buttock   Pain Onset More than a month ago   Pain Frequency Constant   Aggravating Factors  sitting and home exercises   Pain Relieving Factors standing,  voltaren gel, and pain medication   Multiple Pain Sites No            [facility] PT Assessment - 04/19/16 0001    Flexibility   Soft Tissue Assessment /Muscle Length yes   Hamstrings Limited extensibility on R.   Special Tests    Special Tests Sacrolliac Tests  Supine to Long Sit Test reveals R posterior innominate   Leg length test  --                     [facility] Adult PT Treatment/Exercise - 04/19/16 0001    Exercises   Exercises Other Exercises   Other Exercises  Supine MET for R posterior innominate 10 reps x5-sec holds with pt-reported improvement in R-sided PSIS pain (from 2/10 to 0/10). Reviewed supine, hook lying transverse abdominus activation with concurrent knee raises 4 reps x3 sets with rests between. Pt required mod cueing for proper technique with knee lifts with TA bracing. Seated: modified MET for R posterior innominate in which pt resists R hip flexion with R hand for 5 reps x5-sec holds.   Lumbar Exercises: Stretches   Active Hamstring Stretch 2 reps;Other (comment)   Active Hamstring Stretch Limitations Seated R 2 x45-sec holds with R foot on 6" step                PT Education - 04/19/16 1400    Education provided Yes   Education Details HEP: recommended pt hold strengthening and focus on MET for R posterior innominate, R hamstring stretch. Logroll technique for supine <> sit.   Person(s) Educated Patient   Methods Explanation;Demonstration;Tactile cues;Verbal cues;Handout   Comprehension Verbalized understanding;Returned demonstration;Need further instruction  May need reinforcement in future session          PT Short Term Goals - 04/08/16 1230    PT SHORT TERM GOAL #1   Title STG= LTG   PT SHORT TERM GOAL #2   Title --           PT Long Term Goals - 04/08/16 1231    PT LONG TERM GOAL #1   Title Pt will report tolerance of sitting for greater than 1 hour with less than 4/5 pain in 4 weeks (05/08/16)   Status New   PT LONG TERM  GOAL #2   Title Pt wil report low back pain less than or equal to 4/5 in 4 weeks (05/08/16)   Status New   PT LONG TERM GOAL #3   Title Pt will be I with HEP as demostrated in the clinic in 4 weeks ( 04/1116)   PT LONG TERM GOAL #4   Title Pt will be improve ODI  from 34% to 22% in 4 weeks  (05/08/16)               Plan - 04/19/16 1420    Clinical Impression Statement Pt reporting incrased pain with HEP. Review of HEP revealed improper technique with HEP. Further assessment suggestive of R posterior innominate. Therefore, modified current core/hip strengthening HEP to muscle energy techniques (MET) in seated/standing to correct R posterior innominate. Pt repsonded well to MET exercises, reporting pain decrease from 2/10 to 0/10 during exercises.    Rehab Potential Good   PT Frequency 1x / week   PT Duration 4 weeks   PT Treatment/Interventions Gait training;Stair training;Functional mobility training;Therapeutic activities;Therapeutic exercise;Balance training;Neuromuscular re-education;Patient/family education;Manual techniques;Energy conservation   PT Next Visit Plan Check current HEP and modify/progress, as appropriate. Once pain is consistently controlled, add hip strengthening exercises back into HEP.    PT Home Exercise Plan See Pt Instructions from 5/23.   Consulted and Agree with Plan of Care Patient      Patient will benefit from skilled therapeutic intervention in order to improve the following deficits and impairments:  Decreased activity tolerance, Decreased mobility, Decreased strength, Pain, Hypermobility, Postural dysfunction  Visit Diagnosis: Right-sided low back pain without sciatica  Muscle weakness (generalized)     Problem List Patient Active Problem List   Diagnosis Date Noted  . Left low back pain 02/23/2016  . Diarrhea 11/12/2015  . Thrush 08/26/2015  . Neoplasm of left breast, primary tumor staging category Tis: ductal carcinoma in situ (DCIS)  08/06/2015  . Breast cancer, female ([place]) 08/06/2015  . Lip swelling 07/21/2015  . Breast cancer of upper-outer quadrant of left female breast ([place]) 06/10/2015  . Breast cancer ([place]) 05/14/2015  . Pain in joint, shoulder region 01/11/2015  . Left sided numbness 12/11/2014  . HLD (hyperlipidemia) 12/11/2014  . Type 2 diabetes mellitus with other circulatory complications ([place]) 95/63/8756  . Vaginitis and vulvovaginitis 10/10/2014  . Paresthesia 09/28/2014  . Essential hypertension 09/28/2014  . TIA (transient ischemic attack) 09/28/2014  . Osteopenia 07/09/2014  . Advance care planning 07/06/2014  . Ankle fracture, left 10/23/2013  . Obesity, unspecified 07/03/2013  . Anxiety state, unspecified 07/03/2013  . Medicare annual wellness visit, subsequent 05/27/2012  . History of bone density study 05/20/2011  . MENOPAUSAL SYNDROME 06/15/2010  . INSOMNIA 06/15/2010  . GERD 09/16/2008  . HYPOTHYROIDISM 08/02/2007  . DEPENDENT EDEMA 08/02/2007  . SYSTOLIC MURMUR 43/32/9518  . ANGINA, HX OF 07/17/2007  . NICOTINE ADDICTION, IN REMISSION 07/17/2007  . HIATAL HERNIA 06/28/2005  . DIVERTICULOSIS, COLON W/O HEM 06/28/2005  . HEMORRHOIDS NOS, W/O COMPLICATIONS 84/16/6063    Billie Ruddy, PT, DPT [facility] [address] [place], [place], [address] Phone: [phone]   Fax:  [phone] 04/19/2016, 2:24 PM  Name: ZONNIE LANDEN MRN: [id number] Date of Birth: 03-06-1941

## 2016-04-19 NOTE — Patient Instructions (Addendum)
MET for Right Posterior Innominate    Begin on your back with the knees and hips bent to 90 degrees. Use a walking stick or broomstick to go through the legs.   Have the broomstick behind the left leg and in front of the right. Stabilize the dowel on ether side with the hands.   Press down with the left leg and up with the right and hold for 5 seconds. Slowly release back to the starting position. Repeat.   Perform this exercise 10 times, 2 times per day (or when you have back pain).     If you have pain and you're not able to lie down for your exercise, sit down and place RIGHT hand on RIGHT thigh. Pull RIGHT leg upward against resistance of hand and hold for 5 seconds. Try this 5 times to decrease pain.  HIP: Hamstrings - Short Sitting - RIGHT    Rest RIGHT leg on raised surface. Keep knee straight. Lift chest. Hinge forward at waist (no bouncing, please) to feel GENTLE stretch in back of RIGHT thigh. Hold _45-60__ seconds.  Perform_2-3__ times per day on the RIGHT leg.

## 2016-04-20 ENCOUNTER — Other Ambulatory Visit: Payer: Self-pay | Admitting: Family Medicine

## 2016-04-22 ENCOUNTER — Other Ambulatory Visit: Payer: Self-pay | Admitting: Nurse Practitioner

## 2016-04-27 ENCOUNTER — Ambulatory Visit: Payer: Commercial Managed Care - HMO | Admitting: Physical Therapy

## 2016-04-27 DIAGNOSIS — M545 Low back pain, unspecified: Secondary | ICD-10-CM

## 2016-04-27 DIAGNOSIS — M6281 Muscle weakness (generalized): Secondary | ICD-10-CM

## 2016-04-27 NOTE — Therapy (Addendum)
[facility] [address] [place], [place], [address] Phone: [phone]   Fax:  [phone]  Physical Therapy Treatment and Discharge Summary  Patient Details  Name: Kelly Kennedy MRN: [id number] Date of Birth: [birth year] Referring Provider: Rosalin Hawking, MD  Encounter Date: 04/27/2016      PT End of Session - 04/27/16 1309    Visit Number 4   Number of Visits 5   Date for PT Re-Evaluation 05/08/16   Authorization Type [employer]   Authorization Time Period G codes required    PT Start Time 0936   PT Stop Time 1015   PT Time Calculation (min) 39 min   Activity Tolerance Patient tolerated treatment well   Behavior During Therapy [facility] for tasks assessed/performed      Past Medical History  Diagnosis Date  . Hyperlipidemia   . Diverticulosis of colon 06/2005  . Hypothyroid   . Insomnia   . Menopausal symptoms   . Hypertension   . Hyperglycemia   . adenomatous Colon polyps     colonoscopy 8/99, 12/02, 8/06  . Pericarditis 1980's  . Chest pain     uses NTG as needed  . Depression   . Bell's palsy   . GERD (gastroesophageal reflux disease)   . H/O hiatal hernia   . Headache(784.0)   . Arthritis   . Irritable bowel   . Diabetes mellitus without complication ([place])   . TIA (transient ischemic attack)     09/2014  . Ductal carcinoma in situ (DCIS) of left breast   . Cancer (HCC)     DCIS left breast  . Family history of adverse reaction to anesthesia     " my mother didn't wake up so they had to put her on life support for about an hour or more; my sisiter has problems waking up too from anesthesia.  . Stroke [facility])     Past Surgical History  Procedure Laterality Date  . Partial hysterectomy      ovaries intact  . Cholecystectomy  1990  . Ectopic pregnancy surgery  1971  . Cystectomy  05/04/08    left lower arm, benign  . Tonsillectomy    . Appendectomy    . Cardiac catheterization    . Fracture  surgery      nose  . Tubal ligation    . Orif ankle fracture Left 10/23/2013    Procedure: OPEN REDUCTION INTERNAL FIXATION (ORIF) LEFT ANKLE FRACTURE;  Surgeon: Marianna Payment, MD;  Location: [place];  Service: Orthopedics;  Laterality: Left;  . Cataract extraction Bilateral 2016  . Partial mastectomy with needle localization and axillary sentinel lymph node bx Left 05/06/2015    Procedure: LEFT BREAST PARTIAL MASTECTOMY WITH NEEDLE LOCALIZATION AND SENTINEL LYMPH NODE MAPPING;  Surgeon: [name], MD;  Location: [place];  Service: General;  Laterality: Left;  . Nipple sparing mastectomy Left 08/06/2015    Procedure: LEFT NIPPLE SPARING MASTECTOMY;  Surgeon: [name], MD;  Location: [place];  Service: General;  Laterality: Left;  . Breast reconstruction with placement of tissue expander and flex hd (acellular hydrated dermis) Left 08/06/2015    Procedure: LEFT BREAST RECONSTRUCTION WITH PLACEMENT OF SALINE IMPLANT AND ACELLULAR DURMAL MATRIX;  Surgeon: Crissie Reese, MD;  Location: [place];  Service: Plastics;  Laterality: Left;    There were no vitals filed for this visit.      Subjective Assessment - 04/27/16 0938    Subjective Pt  reports no pain at this time. Pain has been better overall. Increased low back pain with sitting for a long time at a funeral over the weekend, of which pt states,"I got back up and moving and it went away."    Pertinent History Likes to be called "[name]". PMH signficiant for: DM 2, breast cancer, hypthroidism, GERD, hernia, diverticulosis, insomia, edema, osteopenia, L ankle fx, HTN, TIA, depression    Limitations Sitting   How long can you sit comfortably? one hour   Patient Stated Goals "Ease the pain some."   Currently in Pain? No/denies                         [facility] Adult PT Treatment/Exercise - 04/27/16 0001    Exercises   Exercises Other Exercises   Other Exercises  Supine MET for R posterior innominate 6 reps  x5-sec holds with effective between-session carryover of techinque; reviewed supine, hook lying posterior pelvic tilt with concurrent knee raises x20 reps without rest breaks; seated MET 5 reps x5-sec holds with mod cueing for technique; seated R hamstring stretch 2 x45-sec holds without cueing. Sit <> stand from neutral mat table with dowel held (by pt) at spine x5 reps, from physioball 2 x5 reps; from physioball with dowel held posterior to spine x7 reps (to pt fatigue).  Prone alternating UE/LE extension for lumbar multifidi activation x10 reps.   Lumbar Exercises: Stretches   Active Hamstring Stretch 2 reps;Other (comment)   Active Hamstring Stretch Limitations Seated R 2 x45-sec holds with R foot on 6" step                  PT Short Term Goals - 04/08/16 1230    PT SHORT TERM GOAL #1   Title STG= LTG   PT SHORT TERM GOAL #2   Title --           PT Long Term Goals - 04/27/16 1311    PT LONG TERM GOAL #1   Title Pt will report tolerance of sitting for greater than 1 hour with less than 4/5 pain in 4 weeks (05/08/16)   Status New   PT LONG TERM GOAL #2   Title Pt wil report low back pain less than or equal to 4/5 in 4 weeks (05/08/16)   Status New   PT LONG TERM GOAL #3   Title Pt will be I with HEP as demostrated in the clinic in 4 weeks ( 04/1116)   PT LONG TERM GOAL #4   Title Pt will be improve ODI from 34% to 22% in 4 weeks  (05/08/16)               Plan - 04/27/16 1312    Clinical Impression Statement Session focused on continued lumbar stabilization.Pain has been consistently controlled since last session. Pt tolerated interventions well; no increased pain.    Rehab Potential Good   PT Frequency 1x / week   PT Duration 4 weeks   PT Treatment/Interventions Gait training;Stair training;Functional mobility training;Therapeutic activities;Therapeutic exercise;Balance training;Neuromuscular re-education;Patient/family education;Manual techniques;Energy  conservation   PT Next Visit Plan Assess goals and plan to DC.    Consulted and Agree with Plan of Care Patient      Patient will benefit from skilled therapeutic intervention in order to improve the following deficits and impairments:  Decreased activity tolerance, Decreased mobility, Decreased strength, Pain, Hypermobility, Postural dysfunction  Visit Diagnosis: Right-sided low back pain without sciatica  Muscle  weakness (generalized)     Problem List Patient Active Problem List   Diagnosis Date Noted  . Left low back pain 02/23/2016  . Diarrhea 11/12/2015  . Thrush 08/26/2015  . Neoplasm of left breast, primary tumor staging category Tis: ductal carcinoma in situ (DCIS) 08/06/2015  . Breast cancer, female ([place]) 08/06/2015  . Lip swelling 07/21/2015  . Breast cancer of upper-outer quadrant of left female breast ([place]) 06/10/2015  . Breast cancer ([place]) 05/14/2015  . Pain in joint, shoulder region 01/11/2015  . Left sided numbness 12/11/2014  . HLD (hyperlipidemia) 12/11/2014  . Type 2 diabetes mellitus with other circulatory complications ([place]) 72/53/6644  . Vaginitis and vulvovaginitis 10/10/2014  . Paresthesia 09/28/2014  . Essential hypertension 09/28/2014  . TIA (transient ischemic attack) 09/28/2014  . Osteopenia 07/09/2014  . Advance care planning 07/06/2014  . Ankle fracture, left 10/23/2013  . Obesity, unspecified 07/03/2013  . Anxiety state, unspecified 07/03/2013  . Medicare annual wellness visit, subsequent 05/27/2012  . History of bone density study 05/20/2011  . MENOPAUSAL SYNDROME 06/15/2010  . INSOMNIA 06/15/2010  . GERD 09/16/2008  . HYPOTHYROIDISM 08/02/2007  . DEPENDENT EDEMA 08/02/2007  . SYSTOLIC MURMUR 03/47/4259  . ANGINA, HX OF 07/17/2007  . NICOTINE ADDICTION, IN REMISSION 07/17/2007  . HIATAL HERNIA 06/28/2005  . DIVERTICULOSIS, COLON W/O HEM 06/28/2005  . HEMORRHOIDS NOS, W/O COMPLICATIONS 56/38/7564    Billie Ruddy, PT, DPT [facility] [address] [place], [place], [address] Phone: [phone]   Fax:  [phone] 04/27/2016, 1:18 PM  Name: KEANDREA TAPLEY MRN: [id number] Date of Birth: 1941-03-13   PHYSICAL THERAPY DISCHARGE SUMMARY  Visits from Start of Care: 4  Current functional level related to goals / functional outcomes: Unknown, as patient did not return to PT after initial 4 sessions.    Remaining deficits: Unknown, as patient did not return to PT after initial 4 sessions.    Education / Equipment: See above. Plan: Patient agrees to discharge.  Patient goals were not met. Patient is being discharged due to not returning since the last visit.  ?????         Billie Ruddy, PT, DPT [facility] [address] [place], [place], [address] Phone: [phone]   Fax:  [phone] 10/18/16, 8:25 AM

## 2016-04-27 NOTE — Patient Instructions (Addendum)
Kelly Kennedy, Use these first 2 exercises for pain control (don't worry about doing these every day if you don't have back pain).  1. MET for Right Posterior Innominate    Begin on your back with the knees and hips bent to 90 degrees. Use a walking stick or broomstick to go through the legs.   Have the broomstick behind the left leg and in front of the right. Stabilize the dowel on ether side with the hands.   Press down with the left leg and up with the right and hold for 5 seconds. Slowly release back to the starting position. Repeat.   Perform this exercise 10 times, 2 times per day (or when you have back pain).  2. Seated MET   If you have pain and you're not able to lie down for your exercise, sit down and place RIGHT hand on RIGHT thigh. Pull RIGHT leg upward against resistance of hand and hold for 5 seconds. Try this 5 times to decrease pain.  Use this technique to get into and out of bed: Log Roll    Lying on back, bend left knee and place left arm across chest. Roll all in one movement to the right. Always move as one unit.    Do the remaining exercises every day:  3. HIP: Hamstrings - Short Sitting - RIGHT    Rest RIGHT leg on raised surface. Keep knee straight. Lift chest. Hinge forward at waist (no bouncing, please) to feel GENTLE stretch in back of RIGHT thigh. Hold _45-60__ seconds.  Perform_2-3__ times per day on the RIGHT leg.  Arm / Leg Lift: Opposite (Prone)    Lie on your stomach. With knees straight, lift right leg and opposite arm; hold for 3 seconds. Relax, then repeat by lifting opposite arm/leg. Repeat _10_ times per set. Perform 2-3 sets per day.

## 2016-04-29 ENCOUNTER — Other Ambulatory Visit: Payer: Self-pay | Admitting: *Deleted

## 2016-04-29 DIAGNOSIS — C50919 Malignant neoplasm of unspecified site of unspecified female breast: Secondary | ICD-10-CM

## 2016-04-29 MED ORDER — TAMOXIFEN CITRATE 20 MG PO TABS: 20.0000 mg | ORAL_TABLET | Freq: Every day | ORAL | Status: DC

## 2016-05-04 ENCOUNTER — Ambulatory Visit: Payer: Self-pay | Admitting: Physical Therapy

## 2016-05-10 ENCOUNTER — Other Ambulatory Visit: Payer: Self-pay

## 2016-05-10 DIAGNOSIS — C50919 Malignant neoplasm of unspecified site of unspecified female breast: Secondary | ICD-10-CM

## 2016-05-10 MED ORDER — TAMOXIFEN CITRATE 20 MG PO TABS: 20.0000 mg | ORAL_TABLET | Freq: Every day | ORAL | Status: DC

## 2016-05-12 ENCOUNTER — Other Ambulatory Visit: Payer: Self-pay | Admitting: *Deleted

## 2016-06-06 ENCOUNTER — Encounter: Payer: Self-pay | Admitting: Nurse Practitioner

## 2016-06-06 ENCOUNTER — Telehealth: Payer: Self-pay | Admitting: Nurse Practitioner

## 2016-06-06 ENCOUNTER — Ambulatory Visit (HOSPITAL_BASED_OUTPATIENT_CLINIC_OR_DEPARTMENT_OTHER): Payer: Commercial Managed Care - HMO | Admitting: Nurse Practitioner

## 2016-06-06 VITALS — BP 146/72 | HR 96 | Temp 98.5°F | Resp 18 | Ht 63.0 in | Wt 183.5 lb

## 2016-06-06 DIAGNOSIS — Z170 Estrogen receptor positive status [ER+]: Secondary | ICD-10-CM | POA: Diagnosis not present

## 2016-06-06 DIAGNOSIS — D0512 Intraductal carcinoma in situ of left breast: Secondary | ICD-10-CM

## 2016-06-06 DIAGNOSIS — C50412 Malignant neoplasm of upper-outer quadrant of left female breast: Secondary | ICD-10-CM

## 2016-06-06 NOTE — Progress Notes (Signed)
CLINIC:  Cancer Survivorship   REASON FOR VISIT:  Routine follow-up post-treatment for history of breast cancer.  BRIEF ONCOLOGIC HISTORY:    Breast cancer of upper-outer quadrant of left female breast ([place])   03/27/2015 Mammogram Left breast: new calcifications   04/02/2015 Breast US Left breast: segmental calcifications in UOQ. These appear slightly pleomorphic along the posterior margin with a focal group group measuring 8 mm.    04/07/2015 Initial Biopsy Left breast needle core bx: DCIS with calcifications and necrosis, low to intermediate grade, ER+ (97%), PR+ (97%)   04/22/2015 Clinical Stage Stage 0: Tis N0   04/22/2015 Procedure Left breast needle core bx: DCIS with calcifications and necrosis, intermediate frade, ER+ (100%), PR+ (98%)   05/06/2015 Surgery Left lumpectomy/SLNB ([facility]): DCIS, intermediate grade with associated calcifications, spanning 7.9 cm, close to margins, 2 LN removed and negative for malignancy   05/06/2015 Pathologic Stage Stage 0: pTis pN0   08/06/2015 Definitive Surgery Left nipple sparing mastectomy with immediate reconstruction ([facility] / Harlow Mares): DCIS, 0.4 cm, clear margins. Bx of left nipple negative for malignancy.   09/15/2015 -  Anti-estrogen oral therapy Tamoxifen 20 mg daily.  Planned duration of therapy 5 years.    INTERVAL HISTORY:  Ms. Fasulo presents to the [facility] today for ongoing follow up regarding her history of breast cancer. Overall, Ms. Morasch reports feeling doing well since her last visit with Dr. Jana Hakim in January 2017. She continues on tamoxifen and is tolerating this well with tolerable hot flashes.  She denies any chest pain, shortness of breath or calf pain / swelling.  She does have nocturnal leg cramps.  She reports a small area that has "opened up" along her right breast at the site of her mastopexy scar.  It is non tender and has not drained anything.  She is to make an appointment with Dr. Harlow Mares about it.  Otherwise, she  denies any change within her reconstructed left or natural right breast and her last mammogram was in March 2017 and unremarkable.  She denies any headache, cough, or bone pain. She reports a good appetite and denies any weight loss.    REVIEW OF SYSTEMS:  General: Hot flashes as above. Denies fever, chills, unintentional weight loss, or generalized fatigue.  HEENT: Wears glasses. Denies visual changes, hearing loss, mouth sores, or difficulty swallowing. Cardiac: Denies palpitations and lower extremity edema.  Respiratory: Denies wheeze or dyspnea on exertion.  Breast: As above.  GI: Denies abdominal pain, constipation, diarrhea, nausea, or vomiting.  GU: Denies dysuria, hematuria, vaginal bleeding, vaginal discharge, or vaginal dryness.  Musculoskeletal: As above. Neuro: Denies recent fall or numbness / tingling in her extremities.  Skin: As above. Psych: Denies depression, anxiety, insomnia, or memory loss.   A 14-point review of systems was completed and was negative, except as noted above.   ONCOLOGY TREATMENT TEAM:  1. Surgeon:  Dr. Brantley Stage at [facility]  2. Medical Oncologist: Dr. Jana Hakim 3. Plastic Surgeon: Dr. Harlow Mares   PAST MEDICAL/SURGICAL HISTORY:  Past Medical History  Diagnosis Date  . Hyperlipidemia   . Diverticulosis of colon 06/2005  . Hypothyroid   . Insomnia   . Menopausal symptoms   . Hypertension   . Hyperglycemia   . adenomatous Colon polyps     colonoscopy 8/99, 12/02, 8/06  . Pericarditis 1980's  . Chest pain     uses NTG as needed  . Depression   . Bell's palsy   . GERD (gastroesophageal reflux disease)   .  H/O hiatal hernia   . Headache(784.0)   . Arthritis   . Irritable bowel   . Diabetes mellitus without complication ([place])   . TIA (transient ischemic attack)     09/2014  . Ductal carcinoma in situ (DCIS) of left breast   . Cancer (HCC)     DCIS left breast  . Family history of adverse reaction to anesthesia     " my mother  didn't wake up so they had to put her on life support for about an hour or more; my sisiter has problems waking up too from anesthesia.  . Stroke [facility])    Past Surgical History  Procedure Laterality Date  . Partial hysterectomy      ovaries intact  . Cholecystectomy  1990  . Ectopic pregnancy surgery  1971  . Cystectomy  05/04/08    left lower arm, benign  . Tonsillectomy    . Appendectomy    . Cardiac catheterization    . Fracture surgery      nose  . Tubal ligation    . Orif ankle fracture Left 10/23/2013    Procedure: OPEN REDUCTION INTERNAL FIXATION (ORIF) LEFT ANKLE FRACTURE;  Surgeon: Marianna Payment, MD;  Location: [place];  Service: Orthopedics;  Laterality: Left;  . Cataract extraction Bilateral 2016  . Partial mastectomy with needle localization and axillary sentinel lymph node bx Left 05/06/2015    Procedure: LEFT BREAST PARTIAL MASTECTOMY WITH NEEDLE LOCALIZATION AND SENTINEL LYMPH NODE MAPPING;  Surgeon: [name], MD;  Location: [place];  Service: General;  Laterality: Left;  . Nipple sparing mastectomy Left 08/06/2015    Procedure: LEFT NIPPLE SPARING MASTECTOMY;  Surgeon: [name], MD;  Location: [place];  Service: General;  Laterality: Left;  . Breast reconstruction with placement of tissue expander and flex hd (acellular hydrated dermis) Left 08/06/2015    Procedure: LEFT BREAST RECONSTRUCTION WITH PLACEMENT OF SALINE IMPLANT AND ACELLULAR DURMAL MATRIX;  Surgeon: Crissie Reese, MD;  Location: [place];  Service: Plastics;  Laterality: Left;     ALLERGIES:  Allergies  Allergen Reactions  . Ace Inhibitors Swelling    H/o lip swelling with change in losartan pill  . Angiotensin Receptor Blockers Swelling    H/o lip swelling with change in losartan pill  . Crestor [Rosuvastatin Calcium] Other (See Comments)    HA, aches, intolerant on pill from [place].  Could tolerate med from [place].   . Niacin Rash  . Red Dye Rash     CURRENT  MEDICATIONS:  Current Outpatient Prescriptions on File Prior to Visit  Medication Sig Dispense Refill  . amitriptyline (ELAVIL) 50 MG tablet TAKE 1-2 TABLETS (50-100 MG TOTAL) BY MOUTH AT BEDTIME AS NEEDED FOR SLEEP. 180 tablet 1  . amLODipine (NORVASC) 5 MG tablet Take 1 tablet (5 mg total) by mouth daily.    Marland Kitchen aspirin EC 81 MG tablet Take 81 mg by mouth daily.    . Aspirin-Salicylamide-Caffeine (BC HEADACHE POWDER PO) Take 1 Package by mouth daily as needed (pain).     Marland Kitchen diclofenac (VOLTAREN) 75 MG EC tablet Take 1 tablet (75 mg total) by mouth 2 (two) times daily. 30 tablet 0  . glipiZIDE (GLUCOTROL) 5 MG tablet Take 0.5 tablets (2.5 mg total) by mouth daily before breakfast. 60 tablet 3  . levothyroxine (SYNTHROID, LEVOTHROID) 100 MCG tablet TAKE ONE TABLET BY MOUTH DAILY EXCEPT ON MONDAY AND WEDNESDAY TAKE 1/2 TABLET 90 tablet 3  . losartan (COZAAR) 100 MG tablet  Take 100 mg by mouth.    . magnesium oxide (MAG-OX) 400 (241.3 Mg) MG tablet TAKE 1 TABLET (400 MG TOTAL) BY MOUTH DAILY. 30 tablet 11  . omeprazole (PRILOSEC OTC) 20 MG tablet Take 20 mg by mouth daily.    . tamoxifen (NOLVADEX) 20 MG tablet Take 1 tablet (20 mg total) by mouth daily. 90 tablet 1  . VOLTAREN 1 % GEL Apply 2 g topically 2 (two) times daily as needed (pain).     . fluconazole (DIFLUCAN) 200 MG tablet Take 1 tablet (200 mg total) by mouth every other day. (Patient not taking: Reported on 06/06/2016) 3 tablet 5  . nitroGLYCERIN (NITROSTAT) 0.4 MG SL tablet PLACE 1 TABLET (0.4 MG TOTAL) UNDER THE TONGUE EVERY 5 (FIVE) MINUTES AS NEEDED FOR CHEST PAIN. (Patient not taking: Reported on 06/06/2016) 25 tablet 3  . terconazole (TERAZOL 7) 0.4 % vaginal cream Place 1 applicator vaginally at bedtime. (Patient not taking: Reported on 06/06/2016) 45 g 5   No current facility-administered medications on file prior to visit.     ONCOLOGIC FAMILY HISTORY:  Family History  Problem Relation Age of Onset  . Cancer Father      stomach   . Heart disease Son     MI  . Arthritis Mother   . Diabetes Mother   . Colon cancer Neg Hx   . Pancreatic cancer Neg Hx   . Stomach cancer Neg Hx   . Breast cancer Neg Hx   . Cancer Brother     lung cancer     GENETIC COUNSELING/TESTING: No   SOCIAL HISTORY:  Daylee L Rena is widowed and lives with her sister in [place], [place].  She has 3 children. 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  She is a former smoker and denies any current or history of illicit drug use.  She uses alcohol socially.      PHYSICAL EXAMINATION:  Vital Signs: Filed Vitals:   06/06/16 1431  BP: 146/72  Pulse: 96  Temp: 98.5 F (36.9 C)  Resp: 18   Weight: 183 (down 3# since 11/2015) ECOG performance status: 0 General: Well-nourished, well-appearing female in no acute distress.  She is unaccompanied in clinic today.   HEENT: Head is atraumatic and normocephalic.  Pupils equal and reactive to light and accomodation. Conjunctivae clear without exudate.  Sclerae anicteric. Oral mucosa is pink, moist, and intact without lesions.  Oropharynx is pink without lesions or erythema.  Lymph: No cervical, supraclavicular, infraclavicular, or axillary lymphadenopathy noted on palpation.  Cardiovascular: Regular rate and rhythm without murmurs, rubs, or gallops. Respiratory: Clear to auscultation bilaterally. Chest expansion symmetric without accessory muscle use on inspiration or expiration.  Breast: Bilateral breast exam performed. Right reconstructed breast without mass or lesion.  Good cosmesis.  Left breast with mastopexy scars.  No nodularity or mass.  Small ~0.25 area of non-intact skin, pink without erythema or purulent discharge.   GI: Abdomen soft and round. No tenderness to palpation. Bowel sounds normoactive in 4 quadrants. No hepatosplenomegaly.   GU: Deferred.  Musculoskeletal: Muscle strength 5/5 in all extremities.   Neuro: No focal deficits. Steady gait.  Psych:  Mood and affect normal and appropriate for situation.  Extremities: No edema, cyanosis, or clubbing.  Skin: Warm and dry. No open lesions noted.   LABORATORY DATA:  Recent Results (from the past 2160 hour(s))  POCT urinalysis dipstick     Status: None   Collection Time: 03/10/16  2:47  PM  Result Value Ref Range   Color, UA yellow    Clarity, UA amber    Glucose, UA neg    Bilirubin, UA neg    Ketones, UA neg    Spec Grav, UA 1.020    Blood, UA neg    pH, UA 5.0    Protein, UA neg    Urobilinogen, UA negative    Nitrite, UA neg    Leukocytes, UA Negative Negative  NuSwab Vaginitis (VG)     Status: None   Collection Time: 03/10/16  4:44 PM  Result Value Ref Range   Atopobium vaginae Low - 0 Score   BVAB 2 Low - 0 Score   Megasphaera 1 Low - 0 Score    Comment: Calculate total score by adding the 3 individual bacterial vaginosis (BV) marker scores together.  Total score is interpreted as follows: Total score 0-1: Indicates the absence of BV. Total score   2: Indeterminate for BV. Additional clinical                  data should be evaluated to establish a                  diagnosis. Total score 3-6: Indicates the presence of BV. This test was developed and its performance characteristics determined by [facility].  It has not been cleared or approved by the Food and Drug Administration.  The FDA has determined that such clearance or approval is not necessary.    Candida albicans, NAA Negative Negative   Candida glabrata, NAA Negative Negative    Comment: This test was developed and its performance characteristics determined by [facility].  It has not been cleared or approved by the Food and Drug Administration.  The FDA has determined that such clearance or approval is not necessary.    Trich vag by NAA Negative Negative  Hemoglobin A1c     Status: Abnormal   Collection Time: 04/12/16  3:41 PM  Result Value Ref Range   Hgb A1c MFr Bld 6.8 (H) 4.6 - 6.5 %    Comment: Glycemic  Control Guidelines for People with Diabetes:Non Diabetic:  <6%Goal of Therapy: <7%Additional Action Suggested:  >8%     DIAGNOSTIC IMAGING: Right diagnostic mammogram performed 02/09/2016 with tomosynthesis shows no evidence of mass or lesion.  Breast density category B.     ASSESSMENT AND PLAN:   1. Breast cancer: Stage 0 ductal carcinoma in situ of the left breast (03/2015), ER positive, PR positive, S/P lumpectomy (04/2015) with close margins treated with left nipple sparing mastectomy with immediate reconstruction (07/2015) followed by adjuvant endocrine therapy with tamoxifen begun 08/2015 with plans for 5 years of therapy.  Ms. Resendez is doing well with no clinical symptoms worrisome for cancer recurrence at this time.  Her last right sided  mammogram was in March 2017 and unremarkable. I have reviewed the recommendations for ongoing surveillance with her and she will follow-up with her medical oncologist,  Dr. Jana Hakim, in June 2018 with history and physical exam per surveillance protocol.  She will continue her anti-estrogen therapy with tamoxifen at this time and was instructed to make [place] aware if she notes any change within her reconstructed or opposite breast, any new symptoms such as pain, shortness of breath, weight loss, or fatigue.  She is to follow up with Dr. Harlow Mares about the small area of poor healing along her right breast S/P mastopexy ASAP and will let us know if she has any  difficulty securing this appointment.  She will also report any new or increased side effects of the endocrine therapy or any difficulties with it.   Though the incidence is low, there is an associated risk of endometrial cancer with anti-estrogen therapies like Tamoxifen.  Ms. Buske was encouraged to contact Dr. Jana Hakim or myself with any vaginal bleeding while taking Tamoxifen. Other side effects of Tamoxifen were again reviewed with her as well.  2. Cancer screening:  Due to Ms. Peruski's history and her age, she  should receive screening for skin cancers and colon cancer.  She is S/P partial hysterectomy.  The information and recommendations were shared with the patient and in her written after visit summary.   3. Support services/counseling:  Ms. Kreiter was offered support today through active listening and expressive supportive counseling.   A total of 25 minutes of face-to-face time was spent with this patient with greater than 50% of that time in counseling and care-coordination.   Sylvan Cheese, NP  [facility] [place] [phone]   Note: PRIMARY CARE PROVIDER [name], [place] [phone]

## 2016-06-06 NOTE — Telephone Encounter (Signed)
appt made and avs printed °

## 2016-06-06 NOTE — Patient Instructions (Signed)
Thank you for coming in today!  As we discussed, please continue to perform your self breast exam and report any changes.  Please call Dr. Harlow Mares office to set up the appointment to look at the non-healing spot on your right breast and let us know if you have any difficulty making that appointment. If you note any new symptoms (please see below), be sure to notify us ASAP.  Your right mammogram will be due in April 2018.  We'll have you return in one year's time for your next appointment or sooner if you have any problems. Please be sure to stop by scheduling on your way out to make those appointment(s).  Looking forward to working with you in the future!  Let us know if you have any questions!  Symptoms to Watch for and Report to Your Provider  . Change along your reconstructed breast or lump / bump in opposite breast. . New or unusual pain that seems unrelated to an injury and does not go away, including back pain or bone pain . Weight loss without trying/intending . Unexplained bleeding . A rash or allergic reaction, such as swelling, severe itching or wheezing . Chills or fevers . Persistent headaches . Shortness of breath or difficulty breathing . Bloody stools or blood in your urine . Nausea, vomiting, diarrhea, loss of appetite, or trouble swallowing . A cough that does not go away . Abdominal pain . Swelling in your arms or legs . Fractures . Hot flashes or other menopausal symptoms . Any other signs mentioned by your doctor or nurse or any unusual symptoms                 that you just can't explain   NOTE: Just because you have certain symptoms, it doesn't mean the cancer has come back or you have a new cancer. Symptoms can be due to other problems that need to be addressed.  It is important to watch for these symptoms and report them to your provider so you can be medically evaluated for any of these concerns!    Living a Life of Wellness After Cancer:  *Note: Please consult your  health care provider before using any medications, supplements, over-the-counter products, or other interventions.  Also, please consult your primary care provider before you begin any lifestyle program (diet, exercise, etc.).  Your safety is our top priority and we want to make sure you continue to live a long and healthy life!    Healthy Lifestyle Recommendations  As a cancer survivor, it is important develop a lifelong commitment to a healthy lifestyle. A healthy lifestyle can prevent cancer from returning as well as prevent other diseases like heart disease, diabetes and high blood pressure.  These are some things that you can do to have a healthy lifestyle:  Marland Kitchen Maintain a healthy weight.  . Exercise daily per your doctor's orders. . Eat a balanced diet high in fruits, vegetables, bran, and fiber. Limit intake of red meat      and processed foods.  . Limit how much alcohol you consume, if at all. Ali Lowe regular bone mineral density testing for osteoporosis.  . Talk to your doctor about cardiovascular disease or "heart disease" screening. . Stop smoking (if you smoke). . Know your family history. . Be mindful of your emotional, social, and spiritual needs. . Meet regularly with a Primary Care Provider (PCP). Find a PCP if you do not  already have one. . Talk to your doctor about regular cancer screening including screening for colon           cancer, GYN cancers, and skin cancer.

## 2016-06-08 ENCOUNTER — Ambulatory Visit (INDEPENDENT_AMBULATORY_CARE_PROVIDER_SITE_OTHER): Payer: Commercial Managed Care - HMO | Admitting: Obstetrics

## 2016-06-08 ENCOUNTER — Encounter: Payer: Self-pay | Admitting: Obstetrics

## 2016-06-08 VITALS — BP 159/81 | HR 101 | Temp 97.8°F | Wt 184.0 lb

## 2016-06-08 DIAGNOSIS — E139 Other specified diabetes mellitus without complications: Secondary | ICD-10-CM

## 2016-06-08 DIAGNOSIS — B373 Candidiasis of vulva and vagina: Secondary | ICD-10-CM

## 2016-06-08 DIAGNOSIS — Z780 Asymptomatic menopausal state: Secondary | ICD-10-CM

## 2016-06-08 DIAGNOSIS — D0512 Intraductal carcinoma in situ of left breast: Secondary | ICD-10-CM | POA: Diagnosis not present

## 2016-06-08 DIAGNOSIS — Z9882 Breast implant status: Secondary | ICD-10-CM | POA: Diagnosis not present

## 2016-06-08 DIAGNOSIS — B3731 Acute candidiasis of vulva and vagina: Secondary | ICD-10-CM

## 2016-06-08 NOTE — Progress Notes (Signed)
Patient ID: Kelly Kennedy, female   DOB: 12-25-1940, 75 y.o.   MRN: [id number]  Chief Complaint  Patient presents with  . Follow-up    HPI Kelly Kennedy is a 75 y.o. female.  H/O chronic yeast infections.  Has Type 2 Diabetes.  HPI  Past Medical History  Diagnosis Date  . Hyperlipidemia   . Diverticulosis of colon 06/2005  . Hypothyroid   . Insomnia   . Menopausal symptoms   . Hypertension   . Hyperglycemia   . adenomatous Colon polyps     colonoscopy 8/99, 12/02, 8/06  . Pericarditis 1980's  . Chest pain     uses NTG as needed  . Depression   . Bell's palsy   . GERD (gastroesophageal reflux disease)   . H/O hiatal hernia   . Headache(784.0)   . Arthritis   . Irritable bowel   . Diabetes mellitus without complication ([place])   . TIA (transient ischemic attack)     09/2014  . Ductal carcinoma in situ (DCIS) of left breast   . Cancer (HCC)     DCIS left breast  . Family history of adverse reaction to anesthesia     " my mother didn't wake up so they had to put her on life support for about an hour or more; my sisiter has problems waking up too from anesthesia.  . Stroke [facility])     Past Surgical History  Procedure Laterality Date  . Partial hysterectomy      ovaries intact  . Cholecystectomy  1990  . Ectopic pregnancy surgery  1971  . Cystectomy  05/04/08    left lower arm, benign  . Tonsillectomy    . Appendectomy    . Cardiac catheterization    . Fracture surgery      nose  . Tubal ligation    . Orif ankle fracture Left 10/23/2013    Procedure: OPEN REDUCTION INTERNAL FIXATION (ORIF) LEFT ANKLE FRACTURE;  Surgeon: Marianna Payment, MD;  Location: [place];  Service: Orthopedics;  Laterality: Left;  . Cataract extraction Bilateral 2016  . Partial mastectomy with needle localization and axillary sentinel lymph node bx Left 05/06/2015    Procedure: LEFT BREAST PARTIAL MASTECTOMY WITH NEEDLE LOCALIZATION AND SENTINEL LYMPH NODE MAPPING;  Surgeon: [name],  MD;  Location: [place];  Service: General;  Laterality: Left;  . Nipple sparing mastectomy Left 08/06/2015    Procedure: LEFT NIPPLE SPARING MASTECTOMY;  Surgeon: [name], MD;  Location: [place];  Service: General;  Laterality: Left;  . Breast reconstruction with placement of tissue expander and flex hd (acellular hydrated dermis) Left 08/06/2015    Procedure: LEFT BREAST RECONSTRUCTION WITH PLACEMENT OF SALINE IMPLANT AND ACELLULAR DURMAL MATRIX;  Surgeon: Crissie Reese, MD;  Location: [place];  Service: Plastics;  Laterality: Left;    Family History  Problem Relation Age of Onset  . Cancer Father     stomach   . Heart disease Son     MI  . Arthritis Mother   . Diabetes Mother   . Colon cancer Neg Hx   . Pancreatic cancer Neg Hx   . Stomach cancer Neg Hx   . Breast cancer Neg Hx   . Cancer Brother     lung cancer    Social History Social History  Substance Use Topics  . Smoking status: Former Research scientist (life sciences)  . Smokeless tobacco: Former Systems developer  . Alcohol Use: No     Comment: social  Allergies  Allergen Reactions  . Ace Inhibitors Swelling    H/o lip swelling with change in losartan pill  . Angiotensin Receptor Blockers Swelling    H/o lip swelling with change in losartan pill  . Crestor [Rosuvastatin Calcium] Other (See Comments)    HA, aches, intolerant on pill from [place].  Could tolerate med from [place].   . Niacin Rash  . Red Dye Rash    Current Outpatient Prescriptions  Medication Sig Dispense Refill  . amitriptyline (ELAVIL) 50 MG tablet TAKE 1-2 TABLETS (50-100 MG TOTAL) BY MOUTH AT BEDTIME AS NEEDED FOR SLEEP. 180 tablet 1  . amLODipine (NORVASC) 5 MG tablet Take 1 tablet (5 mg total) by mouth daily.    Marland Kitchen aspirin EC 81 MG tablet Take 81 mg by mouth daily.    . Aspirin-Salicylamide-Caffeine (BC HEADACHE POWDER PO) Take 1 Package by mouth daily as needed (pain).     Marland Kitchen diclofenac (VOLTAREN) 75 MG EC tablet Take 1 tablet (75 mg total) by mouth 2 (two)  times daily. 30 tablet 0  . glipiZIDE (GLUCOTROL) 5 MG tablet Take 0.5 tablets (2.5 mg total) by mouth daily before breakfast. 60 tablet 3  . levothyroxine (SYNTHROID, LEVOTHROID) 100 MCG tablet TAKE ONE TABLET BY MOUTH DAILY EXCEPT ON MONDAY AND WEDNESDAY TAKE 1/2 TABLET 90 tablet 3  . losartan (COZAAR) 100 MG tablet Take 100 mg by mouth.    . nitroGLYCERIN (NITROSTAT) 0.4 MG SL tablet PLACE 1 TABLET (0.4 MG TOTAL) UNDER THE TONGUE EVERY 5 (FIVE) MINUTES AS NEEDED FOR CHEST PAIN. 25 tablet 3  . omeprazole (PRILOSEC OTC) 20 MG tablet Take 20 mg by mouth daily.    . tamoxifen (NOLVADEX) 20 MG tablet Take 1 tablet (20 mg total) by mouth daily. 90 tablet 1  . VOLTAREN 1 % GEL Apply 2 g topically 2 (two) times daily as needed (pain).     . fluconazole (DIFLUCAN) 200 MG tablet Take 1 tablet (200 mg total) by mouth every other day. (Patient not taking: Reported on 06/06/2016) 3 tablet 5  . magnesium oxide (MAG-OX) 400 (241.3 Mg) MG tablet TAKE 1 TABLET (400 MG TOTAL) BY MOUTH DAILY. (Patient not taking: Reported on 06/08/2016) 30 tablet 11  . terconazole (TERAZOL 7) 0.4 % vaginal cream Place 1 applicator vaginally at bedtime. (Patient not taking: Reported on 06/06/2016) 45 g 5   No current facility-administered medications for this visit.    Review of Systems Review of Systems Constitutional: negative for fatigue and weight loss Respiratory: negative for cough and wheezing Cardiovascular: negative for chest pain, fatigue and palpitations Gastrointestinal: negative for abdominal pain and change in bowel habits Genitourinary:positive for chronic yeast infections Integument/breast: negative for nipple discharge Musculoskeletal:negative for myalgias Neurological: negative for gait problems and tremors Behavioral/Psych: negative for abusive relationship, depression Endocrine: negative for temperature intolerance     Blood pressure 159/81, pulse 101, temperature 97.8 F (36.6 C), weight 184 lb (83.462  kg).  Physical Exam Physical Exam:  Deferred  100% of 10 min visit spent on counseling and coordination of care.   Data Reviewed Labs  Assessment     Chronic yeast vaginitis H/O Type 2 Diabetres     Plan    Diflucan / Terazol Rx prn Encouraged to maintain tight glucose control   No orders of the defined types were placed in this encounter.   No orders of the defined types were placed in this encounter.

## 2016-06-22 ENCOUNTER — Other Ambulatory Visit: Payer: Self-pay | Admitting: Family Medicine

## 2016-06-22 NOTE — Telephone Encounter (Signed)
Please call in.  Thanks.   

## 2016-06-22 NOTE — Telephone Encounter (Signed)
Rx called to pharmacy as instructed. 

## 2016-06-30 ENCOUNTER — Other Ambulatory Visit: Payer: Self-pay | Admitting: Family Medicine

## 2016-06-30 ENCOUNTER — Encounter: Payer: Self-pay | Admitting: Family Medicine

## 2016-06-30 NOTE — Telephone Encounter (Signed)
Sent. Thanks.   

## 2016-07-27 DIAGNOSIS — Z853 Personal history of malignant neoplasm of breast: Secondary | ICD-10-CM | POA: Diagnosis not present

## 2016-08-12 ENCOUNTER — Other Ambulatory Visit: Payer: Self-pay | Admitting: Family Medicine

## 2016-08-17 ENCOUNTER — Encounter: Payer: Self-pay | Admitting: Family Medicine

## 2016-08-17 ENCOUNTER — Ambulatory Visit (INDEPENDENT_AMBULATORY_CARE_PROVIDER_SITE_OTHER): Payer: Commercial Managed Care - HMO | Admitting: Family Medicine

## 2016-08-17 VITALS — BP 138/82 | HR 88 | Temp 98.5°F | Wt 178.2 lb

## 2016-08-17 DIAGNOSIS — M545 Low back pain, unspecified: Secondary | ICD-10-CM

## 2016-08-17 DIAGNOSIS — E119 Type 2 diabetes mellitus without complications: Secondary | ICD-10-CM

## 2016-08-17 DIAGNOSIS — E1159 Type 2 diabetes mellitus with other circulatory complications: Secondary | ICD-10-CM | POA: Diagnosis not present

## 2016-08-17 DIAGNOSIS — E785 Hyperlipidemia, unspecified: Secondary | ICD-10-CM | POA: Diagnosis not present

## 2016-08-17 DIAGNOSIS — Z23 Encounter for immunization: Secondary | ICD-10-CM | POA: Diagnosis not present

## 2016-08-17 DIAGNOSIS — N760 Acute vaginitis: Secondary | ICD-10-CM

## 2016-08-17 LAB — HEMOGLOBIN A1C: HEMOGLOBIN A1C: 6.5 % (ref 4.6–6.5)

## 2016-08-17 NOTE — Patient Instructions (Addendum)
Go to the lab on the way out.  We'll contact you with your lab report. Check with the neurology clinic about your pain meds for your back and update me as needed.  Recheck in about 4 months, labs before a physical.  Take care.  Glad to see you.

## 2016-08-17 NOTE — Progress Notes (Signed)
Pre visit review using our clinic review tool, if applicable. No additional management support is needed unless otherwise documented below in the visit note. 

## 2016-08-17 NOTE — Progress Notes (Signed)
Diabetes:  Using medications without difficulties: yes Hypoglycemic episodes:no sx Hyperglycemic episodes:no sx Feet problems:no tingling but back pain at baseline.   Blood Sugars averaging: not checked eye exam within last year: yes  Walking with a cane due to back pain.  Had been seeing neuro clinic and PT in the meantime. Asking about parking sticker and this is reasonable.  Done at [facility] and given to patient.  She is trying to avoid surgery.  She is still doing her PT exercises at home.   She had her mammogram done 01/2016 per patient report.   HLD.  She absolutely couldn't tolerate statin and had to stop.  D/w pt.  She feels better off it.  She has worked on diet in the meantime.    She saw gyn about recurrent yeast infections.  Better with prn diflucan and she is still on tamoxifen.  D/w pt.  I'll defer on both meds.    PMH and SH reviewed  ROS: Per HPI unless specifically indicated in ROS section   Meds, vitals, and allergies reviewed.   GEN: nad, alert and oriented HEENT: mucous membranes moist NECK: supple w/o LA CV: rrr. PULM: ctab, no inc wob ABD: soft, +bs EXT: trace BLE edema SKIN: no acute rash

## 2016-08-18 ENCOUNTER — Encounter: Payer: Self-pay | Admitting: Family Medicine

## 2016-08-18 NOTE — Assessment & Plan Note (Signed)
She has talked with neurology about this. She has gone to PT. I asked her to check with neurology to see what they thought about her options. She was to avoid surgery if at all possible. She is going to check with neurology in the update me as needed.

## 2016-08-18 NOTE — Assessment & Plan Note (Signed)
Statin intolerant. Continue work on diet and exercise. She agrees. Allergy list updated.

## 2016-08-18 NOTE — Assessment & Plan Note (Signed)
She has been working hard on her weight and diet. Continue current medications. See notes on labs. She agrees with plan.

## 2016-08-18 NOTE — Assessment & Plan Note (Signed)
She has seen gynecology. Improved with repeated when necessary use of Diflucan. She is going to talk with oncology about her tamoxifen, and see if this needs to eventually be changed. I did not change any of her medications today. Discussed with patient. She agrees.

## 2016-09-13 DIAGNOSIS — H11153 Pinguecula, bilateral: Secondary | ICD-10-CM | POA: Diagnosis not present

## 2016-09-13 DIAGNOSIS — H26492 Other secondary cataract, left eye: Secondary | ICD-10-CM | POA: Diagnosis not present

## 2016-09-13 DIAGNOSIS — H11423 Conjunctival edema, bilateral: Secondary | ICD-10-CM | POA: Diagnosis not present

## 2016-09-13 DIAGNOSIS — H40023 Open angle with borderline findings, high risk, bilateral: Secondary | ICD-10-CM | POA: Diagnosis not present

## 2016-09-13 DIAGNOSIS — H18413 Arcus senilis, bilateral: Secondary | ICD-10-CM | POA: Diagnosis not present

## 2016-09-13 DIAGNOSIS — Z9841 Cataract extraction status, right eye: Secondary | ICD-10-CM | POA: Diagnosis not present

## 2016-09-13 DIAGNOSIS — H40053 Ocular hypertension, bilateral: Secondary | ICD-10-CM | POA: Diagnosis not present

## 2016-09-13 DIAGNOSIS — H5340 Unspecified visual field defects: Secondary | ICD-10-CM | POA: Diagnosis not present

## 2016-09-13 DIAGNOSIS — H3589 Other specified retinal disorders: Secondary | ICD-10-CM | POA: Diagnosis not present

## 2016-09-28 ENCOUNTER — Ambulatory Visit (INDEPENDENT_AMBULATORY_CARE_PROVIDER_SITE_OTHER): Payer: Commercial Managed Care - HMO | Admitting: Neurology

## 2016-09-28 ENCOUNTER — Encounter: Payer: Self-pay | Admitting: Neurology

## 2016-09-28 DIAGNOSIS — G8929 Other chronic pain: Secondary | ICD-10-CM

## 2016-09-28 DIAGNOSIS — M545 Low back pain, unspecified: Secondary | ICD-10-CM

## 2016-09-28 DIAGNOSIS — E785 Hyperlipidemia, unspecified: Secondary | ICD-10-CM

## 2016-09-28 DIAGNOSIS — G459 Transient cerebral ischemic attack, unspecified: Secondary | ICD-10-CM | POA: Diagnosis not present

## 2016-09-28 NOTE — Progress Notes (Signed)
NEUROLOGY CLINIC FOLLOW UP NOTE  NAME: Kelly Kennedy DOB: Oct 13, 1941  HPI: Kelly Kennedy is a 75 y.o. female with PMH of HTN, DM, HLD who presents as a new patient for recurrent left sided weakness and numbness.   Pt was admitted to [facility] on 09/28/14 for episodic exertional chest pain and palpitations. She also complained of headache and episode of left UE and LE numbness and weakness. Her ECG is abnormal and there are subtle dynamic changes since yesterday - more obvious ST depression and T wave inversion inferolaterally. Borderline long QT. After admission, she had nuclear stress test on 09/29/2014 that was completely normal. EF was 62%. Cardiology suspect afib and recommended 30 day monitoring which done as outpt did not see any episodes of afib. Her outpt 2D echo showed EF 50-55%. A1C 6.9 and LDL 42.   She also underwent TIA work up. MRI and MRA negative. CUS Right: 1-39% ICA stenosis. Vertebral artery flow is antegrade. Left: 40-59% ICA stenosis. She has estradil patch for hot flashes. She was put on ASA and crestor on discharge.   She followed up with Dr. Sallyanne Kuster and concerning for TIA vs. Seizure due to recurrent left UE and LE numbness and weakness. By further questioning, she stated that she had both LE weakness since 09/2013 when she had fall with LE fracture. However, on 09/28/14, she had bad headache along with chest pain/palpitations as well as left UE and LE weakness and numbness, lasted about 3 hours and resolved. This was the first time she has it. Since then, she has possible 3 times a week left sided numbness and weakness but not as bad as the first one, lasting 10-24min and gone. This episodes seems not associated with HA temporally.   However, she does have HA for the last 2 years after she lost her husband. Initially the headache almost everyday, bilateral behind eyes, she contributes to sinus HA, 8/10, lasting all day, mild photophobia but no N/V, BC powder and sleeping  helps. Later the HA goes twice a week and triggers are stress and depression. She did have bad headache prior admission on 09/28/14 along with left UE and LE numbness weakness, but other times the HA and hemiparesis/hemiparesthesia are basically separated from each other.  She has hx of HTN on lasix, cozaar and her BP today 139/81. She denies DM but her A1C was 6.9 and she is on glipizide and she did not check her glucose at home.   She is using estradil patches for hot flashes. She said she needs it for hot flashes.  Her son had sudden death at age 73 - autopsy -"massive heart attack". No other family history of premature cardiac or vascular illness. She denies smoking, drinking or illicit drugs.   03/18/15 follow up - pt has been doing better. She stated that her left side numbness episode now only 3-4 per month, down significantly from 3-4 per week before. Her previous headache also resolved but she had some daily mild headache due to diclofenac eye drop, but gradually better now. She had EEG done here in 11/2014 showed left frontotemporal area, which is not correlating to her symptoms. Will need EEG repeat. Her BP today is high 170/94 in clinic, she does not check BP or glucose at home.  09/29/15 follow up - she was found to have left breast cancer DCIS in 03/2015, had left lumpectomy in 04/2015 and mastectomy in 07/2015. Currently on tamoxifen for 5 years. Her HA is much better,  very infrequent. She still has intermittent left arm numbness but seems to related to left arm movement after left breast surgery. She contributes it to the surgery. She also has left hand and foot tingling sensation intermittently, lasting 64min, up to 2 per week. She does have DM, but glucose in good control and A1C two months ago was 6.9. She denies right hand or foot tingling though. Her BP 103/64 today in clinic, denies dizziness.   03/28/16 follow up - pt has been doing well from neuro standpoint. Still has occasional left hand  and foot numbness, but "once a while" and "not so bad" as she stated. Repeat EEG was normal. However, she started to have left LBP recently, and moving to middle, then right and then now back to middle of the lower back. He saw PCP and given flexeril but not effective. She said she was taking percocet before seems effective for pain control. I told her that PT may be helping more. She agrees. BP today 120/78, she has not checked lipid with PCP yet but will see PCP next month.  Interval history: During the interval time, pt has been doing well from neuro standpoint. BP and glucose controlled well at home BP today 128/74 and glucose at home ~140. Left arm numbness has gone. Still has LBP, wax and wane, finished PT and now self exercise. Following with PCP but has not check lipid panel yet.    Past Medical History:  Diagnosis Date  . adenomatous Colon polyps    colonoscopy 8/99, 12/02, 8/06  . Arthritis   . Bell's palsy   . Cancer [facility])    DCIS left breast  . Chest pain    uses NTG as needed  . Depression   . Diabetes mellitus without complication ([place])   . Diverticulosis of colon 06/2005  . Ductal carcinoma in situ (DCIS) of left breast   . Family history of adverse reaction to anesthesia    " my mother didn't wake up so they had to put her on life support for about an hour or more; my sisiter has problems waking up too from anesthesia.  Marland Kitchen GERD (gastroesophageal reflux disease)   . H/O hiatal hernia   . Headache(784.0)   . Hyperglycemia   . Hyperlipidemia   . Hypertension   . Hypothyroid   . Insomnia   . Irritable bowel   . Menopausal symptoms   . Pericarditis 1980's  . Stroke ([place])   . TIA (transient ischemic attack)    09/2014   Past Surgical History:  Procedure Laterality Date  . APPENDECTOMY    . BREAST RECONSTRUCTION WITH PLACEMENT OF TISSUE EXPANDER AND FLEX HD (ACELLULAR HYDRATED DERMIS) Left 08/06/2015   Procedure: LEFT BREAST RECONSTRUCTION WITH PLACEMENT OF SALINE  IMPLANT AND ACELLULAR DURMAL MATRIX;  Surgeon: Crissie Reese, MD;  Location: [facility] [place];  Service: Plastics;  Laterality: Left;  . CARDIAC CATHETERIZATION    . CATARACT EXTRACTION Bilateral 2016  . CHOLECYSTECTOMY  1990  . CYSTECTOMY  05/04/08   left lower arm, benign  . ECTOPIC PREGNANCY SURGERY  1971  . FRACTURE SURGERY     nose  . NIPPLE SPARING MASTECTOMY Left 08/06/2015   Procedure: LEFT NIPPLE SPARING MASTECTOMY;  Surgeon: [name], MD;  Location: [facility];  Service: General;  Laterality: Left;  . ORIF ANKLE FRACTURE Left 10/23/2013   Procedure: OPEN REDUCTION INTERNAL FIXATION (ORIF) LEFT ANKLE FRACTURE;  Surgeon: Marianna Payment, MD;  Location: [facility];  Service: Orthopedics;  Laterality:  Left;  . PARTIAL HYSTERECTOMY     ovaries intact  . PARTIAL MASTECTOMY WITH NEEDLE LOCALIZATION AND AXILLARY SENTINEL LYMPH NODE BX Left 05/06/2015   Procedure: LEFT BREAST PARTIAL MASTECTOMY WITH NEEDLE LOCALIZATION AND SENTINEL LYMPH NODE MAPPING;  Surgeon: [name], MD;  Location: [facility];  Service: General;  Laterality: Left;  . TONSILLECTOMY    . TUBAL LIGATION     Family History  Problem Relation Age of Onset  . Cancer Father     stomach   . Heart disease Son     MI  . Arthritis Mother   . Diabetes Mother   . Cancer Brother     lung cancer  . Colon cancer Neg Hx   . Pancreatic cancer Neg Hx   . Stomach cancer Neg Hx   . Breast cancer Neg Hx    Current Outpatient Prescriptions  Medication Sig Dispense Refill  . amitriptyline (ELAVIL) 50 MG tablet TAKE 1-2 TABLETS (50-100 MG TOTAL) BY MOUTH AT BEDTIME AS NEEDED FOR SLEEP. 180 tablet 1  . amLODipine (NORVASC) 5 MG tablet TAKE 1 TABLET BY MOUTH DAILY 90 tablet 3  . aspirin EC 81 MG tablet Take 81 mg by mouth daily.    . Aspirin-Salicylamide-Caffeine (BC HEADACHE POWDER PO) Take 1 Package by mouth daily as needed (pain).     Marland Kitchen diclofenac (VOLTAREN) 75 MG EC tablet Take 1 tablet (75 mg total) by mouth 2 (two) times  daily. 30 tablet 0  . fluconazole (DIFLUCAN) 200 MG tablet Take 1 tablet (200 mg total) by mouth every other day. 3 tablet 5  . glipiZIDE (GLUCOTROL) 5 MG tablet Take 0.5 tablets (2.5 mg total) by mouth daily before breakfast. 60 tablet 3  . levothyroxine (SYNTHROID, LEVOTHROID) 100 MCG tablet TAKE ONE TABLET BY MOUTH DAILY EXCEPT ON MONDAY AND WEDNESDAY TAKE 1/2 TABLET 90 tablet 3  . losartan (COZAAR) 100 MG tablet Take 100 mg by mouth.    . magnesium oxide (MAG-OX) 400 (241.3 Mg) MG tablet TAKE 1 TABLET (400 MG TOTAL) BY MOUTH DAILY. 30 tablet 11  . nitroGLYCERIN (NITROSTAT) 0.4 MG SL tablet PLACE 1 TABLET (0.4 MG TOTAL) UNDER THE TONGUE EVERY 5 (FIVE) MINUTES AS NEEDED FOR CHEST PAIN. 25 tablet 3  . omeprazole (PRILOSEC OTC) 20 MG tablet Take 20 mg by mouth daily.    . tamoxifen (NOLVADEX) 20 MG tablet Take 1 tablet (20 mg total) by mouth daily. 90 tablet 1  . terconazole (TERAZOL 7) 0.4 % vaginal cream Place 1 applicator vaginally at bedtime. 45 g 5  . traMADol (ULTRAM) 50 MG tablet TAKE 1 TABLET BY MOUTH EVERY 8 HOURS AS NEEDED 30 tablet 2  . VOLTAREN 1 % GEL Apply 2 g topically 2 (two) times daily as needed (pain).      No current facility-administered medications for this visit.    Allergies  Allergen Reactions  . Ace Inhibitors Swelling    H/o lip swelling with change in losartan pill  . Angiotensin Receptor Blockers Swelling    H/o lip swelling with change in losartan pill  . Statins     myalgias  . Niacin Rash  . Red Dye Rash   Social History   Social History  . Marital status: Widowed    Spouse name: [name]  . Number of children: 3  . Years of education: [name]   Occupational History  . retired, previous Engineer, manufacturing systems    Social History Main Topics  . Smoking status: Former Research scientist (life sciences)  .  Smokeless tobacco: Former Systems developer  . Alcohol use No     Comment: social  . Drug use: No  . Sexual activity: No   Other Topics Concern  . Not on file   Social History Narrative   From  [place].   3 children, one died from MI in 19-Mar-2005   Married 03/20/59   Husband and brother died in the same week in Dec 19, 2012      Patient is right handed.   Patient has hs education.   Patient drinks 1 cup of soda daily, and green tea.    Review of Systems Full 14 system review of systems performed and notable only for those listed, all others are neg:  Constitutional:   Cardiovascular:  Leg swelling Ear/Nose/Throat: ringing in ears  Skin: moles  Eyes: blurry vision, eye itching, eye redness  Respiratory: [name]  Gastroitestinal: diarrhea  Hematology/Lymphatic: bruise  Endocrine: excessive eating  Musculoskeletal: back pain  Allergy/Immunology: [name]  Neurological: memory loss, HA, numbness, speech difficulty  Psychiatric: depression   Physical Exam  There were no vitals filed for this visit.  General - Well nourished, well developed, in no apparent distress.  Ophthalmologic - Sharp disc margins OU.  Cardiovascular - Regular rate and rhythm with no murmur. Carotid pulses were 2+ without bruits .   Neck - supple, no nuchal rigidity .  Mental Status -  Level of arousal and orientation to time, place, and person were intact. Language including expression, naming, repetition, comprehension was assessed and found intact. Fund of Knowledge was assessed and was intact.  Cranial Nerves II - XII - II - Visual field intact OU. III, IV, VI - Extraocular movements intact. V - Facial sensation intact bilaterally. VII - Facial movement intact bilaterally. VIII - Hearing & vestibular intact bilaterally. X - Palate elevates symmetrically. XI - Chin turning & shoulder shrug intact bilaterally. XII - Tongue protrusion intact.  Motor Strength - The patient's strength was normal in all extremities and pronator drift was absent.  Bulk was normal and fasciculations were absent.   Motor Tone - Muscle tone was assessed at the neck and appendages and was normal.  Reflexes - The patient's  reflexes were normal in all extremities and she had no pathological reflexes.  Sensory - Light touch, temperature/pinprick and Romberg testing were assessed and were normal.    Coordination - The patient had normal movements in the hands and feet with no ataxia or dysmetria.  Tremor was absent.  Gait and Station - The patient's transfers, posture, gait, station, and turns were observed as normal.   Imaging MRI and MRA head  1. No acute intracranial abnormality. 2. Mild chronic small vessel ischemic disease. 3. No evidence of major intracranial arterial occlusion or significant proximal stenosis.  2D echo - Left ventricle: The cavity size was normal. Wall thickness was normal. Systolic function was normal. The estimated ejection fraction was in the range of 50% to 55%. Wall motion was normal; there were no regional wall motion abnormalities. Doppler parameters are consistent with abnormal left ventricular relaxation (grade 1 diastolic dysfunction).  CUS - 1-39% right ICA and 40-59% left ICA stenosis. Moderate right ECA stemnosis. Both vertebral artery flow is antegrade.  Stress test 09/29/14-  Pre-test: no chest pain or SOB With GXT: target HR reached in stage 1, patient hypertensive so CL injected and stage held. Pt continued to exercise for 8 minutes more. No chest pain with exertion. + SOB ECG: LVH changes at baseline, inferolateral ST changes  deepened with exertion but no ST elevation seen SBP elevated with exertion to 194/87, so pt not pushed to increase exertion. Final report and images pending.   Treadmill Myoview IMPRESSION: 1. No reversible ischemia or infarction. 2. Normal left ventricular wall motion. 3. Left ventricular ejection fraction 62% 4. Low-risk stress test findings.  EEG 12/11/14 - This is an abnormal EEG recording secondary to  intermittent dysrhythmic theta activity and sharp transients  emanating from the left frontotemporal area. This  would suggest a  lowered seizure threshold with a left brain focus. This recording  does not correlate well with the clinical events, however.  Clinical correlation is required.  Lab Review Component     Latest Ref Rng 06/27/2014 07/04/2014 09/29/2014 10/06/2014  Cholesterol     0 - 200 mg/dL 143  120   Triglycerides     <150 mg/dL 243.0 (H)  110   HDL     >39 mg/dL 52.10  56   VLDL     0 - 40 mg/dL 48.6 (H)  22   Total CHOL/HDL Ratio      3  2.1   NonHDL      90.90     LDL (calc)     0 - 99 mg/dL   42   Hgb A1c MFr Bld     4.6 - 6.5 %  6.9 (H) 6.8 (H) 6.9 (H)  Mean Plasma Glucose     <117 mg/dL   148 (H)   TSH     0.35 - 4.50 uIU/mL 0.65     D-Dimer, Quant     0.00 - 0.48 ug/mL-FEU   0.48      Component     Latest Ref Rng & Units 07/30/2015 12/08/2015 04/12/2016 08/17/2016  Cholesterol     0 - 200 mg/dL  232 (H)    Triglycerides     0.0 - 149.0 mg/dL  174.0 (H)    HDL Cholesterol     >39.00 mg/dL  55.60    VLDL     0.0 - 40.0 mg/dL  34.8    LDL (calc)     0 - 99 mg/dL  141 (H)    Total CHOL/HDL Ratio       4    NonHDL       176.28    Hemoglobin A1C     4.6 - 6.5 % 6.9 (H) 6.8 (H) 6.8 (H) 6.5  Mean Plasma Glucose     mg/dL 151       Assessment:   In summary, Aneyah L Stclair is a 75 y.o. female with PMH of HTN, DM, HLD and headache follow up in clinic for episodic left sided numbness. She had episode of chest pain, palpitation, and headache. Cardiac work up so far negative. Pt also has episodes of transient left UE and LE numbness and weakness, lasting 10-61min with or without headache. Stroke work up also negative so far, including MRI and MRA, 2D echo, CUS, 30 day cardiac monitoring and LDL. Her A1C was mildly high at 6.9 and she is on estrogen patch for hot flashes. DDx include TIA, focal seizure and complicated migraine. She is on ASA and crestor for stroke prevention. EEG showed left frontotemporal slow and sharp transients which can not relate to her left sided  symptoms. During interval time, she still has intermittent left hand and foot tingling, may related to diabetic neuropathy but she denies right hand or foot tingling. Her headache is nearly gone. She was  diagnosed with left breast cancer DCIS s/p mastectomy and on tamoxifen. She stated left arm numbness with movement after the breast surgery. Crestor currently off due to SE with generic form crestor. Repeat EEG was normal. Complains of LBP now, finished PT and now self exercise. BP and glucose controlled well. Not check lipid panel for a while, last LDL 141.   Plan: - continue ASA for stroke prevention - check lipid panel today - check BP and glucose at home - home self exercise and healthy diet - Follow up with your primary care physician for stroke risk factor modification. Recommend maintain blood pressure goal <130/80, diabetes with hemoglobin A1c goal below 6.5% and lipids with LDL cholesterol goal below 70 mg/dL.   - follow up as needed.   Orders Placed This Encounter  Procedures  . Lipid panel    No orders of the defined types were placed in this encounter.   Patient Instructions  - continue ASA for stroke prevention - check lipid panel today - check BP and glucose at home - home self exercise and healthy diet - Follow up with your primary care physician for stroke risk factor modification. Recommend maintain blood pressure goal <130/80, diabetes with hemoglobin A1c goal below 6.5% and lipids with LDL cholesterol goal below 70 mg/dL.   - follow up as needed.   Rosalin Hawking, MD PhD [facility] [address], [place], [place] [address] [phone]

## 2016-09-28 NOTE — Patient Instructions (Addendum)
-   continue ASA for stroke prevention - check lipid panel today - check BP and glucose at home - home self exercise and healthy diet - Follow up with your primary care physician for stroke risk factor modification. Recommend maintain blood pressure goal <130/80, diabetes with hemoglobin A1c goal below 6.5% and lipids with LDL cholesterol goal below 70 mg/dL.   - follow up as needed.

## 2016-09-29 LAB — LIPID PANEL
CHOL/HDL RATIO: 3.3 ratio (ref 0.0–4.4)
CHOLESTEROL TOTAL: 208 mg/dL — AB (ref 100–199)
HDL: 63 mg/dL (ref >39)
LDL CALC: 111 mg/dL — AB (ref 0–99)
Triglycerides: 172 mg/dL — ABNORMAL HIGH (ref 0–149)
VLDL Cholesterol Cal: 34 mg/dL (ref 5–40)

## 2016-09-30 ENCOUNTER — Telehealth: Payer: Self-pay

## 2016-09-30 NOTE — Telephone Encounter (Signed)
-----   Message from Rosalin Hawking, MD sent at 09/29/2016  5:19 PM EDT ----- Could you please let the patient know that the lipid panel test done yesterday in our office showed bad cholesterol down from 141 in January to now 111. It has improved but not normal yet. We will mail her the result to her home and she can bring over to her PCP for further management. Thanks.  Hi, Katrina, could you please mail the result to her home address? Thanks much.  Rosalin Hawking, MD PhD Stroke Neurology 09/29/2016 5:18 PM

## 2016-09-30 NOTE — Telephone Encounter (Signed)
Rn gave patient Dr. Erlinda Hong note about cholesterol being down to 111. Rn stated a copy will be fax to her PCP. Also she will need to sign a release form to have a copy. Pt verbalized understanding.

## 2016-10-11 ENCOUNTER — Ambulatory Visit (INDEPENDENT_AMBULATORY_CARE_PROVIDER_SITE_OTHER): Payer: Commercial Managed Care - HMO | Admitting: Family Medicine

## 2016-10-11 ENCOUNTER — Encounter: Payer: Self-pay | Admitting: Family Medicine

## 2016-10-11 DIAGNOSIS — R32 Unspecified urinary incontinence: Secondary | ICD-10-CM

## 2016-10-11 DIAGNOSIS — J0110 Acute frontal sinusitis, unspecified: Secondary | ICD-10-CM | POA: Diagnosis not present

## 2016-10-11 MED ORDER — OXYBUTYNIN CHLORIDE 5 MG PO TABS: 5.0000 mg | ORAL_TABLET | Freq: Three times a day (TID) | ORAL | 1 refills | Status: DC | PRN

## 2016-10-11 MED ORDER — AZITHROMYCIN 250 MG PO TABS: ORAL_TABLET | ORAL | 0 refills | Status: DC

## 2016-10-11 NOTE — Patient Instructions (Signed)
Start zpack in the meantime.  When better then try ditropan for the bladder.  See if that helps.  Update me as needed.  Take care.  Glad to see you.

## 2016-10-11 NOTE — Progress Notes (Signed)
duration of symptoms: about 4 days after working in the yard.   Rhinorrhea: yes Congestion: yes ear pain: no sore throat: yes Cough: yes Myalgias:no Fevers: none known.  Has sweats at baseline from current meds.  Felt a knot on R side one neck yesterday but not felt today.   Urine incont.  Intermittent.  No burning with urination.  She has been doing kegel exercises; help some initially.  Noted with coughing/laughing, etc, but can happen w/o coughing/laughing also.  Longstanding per patient report.    ROS:Per HPI unless specifically indicated in ROS section   Meds, vitals, and allergies reviewed.   GEN: nad, alert and oriented HEENT: mucous membranes moist, TM w/o erythema, nasal epithelium injected, OP with cobblestoning, frontal sinuses ttp B NECK: supple w/o LA CV: rrr. PULM: ctab, no inc wob ABD: soft, +bs EXT: no edema

## 2016-10-11 NOTE — Progress Notes (Signed)
Pre visit review using our clinic review tool, if applicable. No additional management support is needed unless otherwise documented below in the visit note. 

## 2016-10-12 DIAGNOSIS — R32 Unspecified urinary incontinence: Secondary | ICD-10-CM | POA: Insufficient documentation

## 2016-10-12 NOTE — Assessment & Plan Note (Signed)
Nontoxic. Zithromax. Supportive care. Follow-up as needed. She agrees.

## 2016-10-12 NOTE — Assessment & Plan Note (Signed)
Likely mixed urinary incontinence. She is her to been doing pelvic floor exercises. Add on Ditropan to see if that helps when she is over her current sinusitis. Discussed with patient. She agrees. Update me as needed.

## 2016-11-01 ENCOUNTER — Other Ambulatory Visit: Payer: Self-pay | Admitting: Family Medicine

## 2016-11-09 DIAGNOSIS — L929 Granulomatous disorder of the skin and subcutaneous tissue, unspecified: Secondary | ICD-10-CM | POA: Diagnosis not present

## 2016-11-09 DIAGNOSIS — Z853 Personal history of malignant neoplasm of breast: Secondary | ICD-10-CM | POA: Diagnosis not present

## 2016-11-25 ENCOUNTER — Other Ambulatory Visit: Payer: Self-pay | Admitting: Family Medicine

## 2016-11-25 NOTE — Telephone Encounter (Signed)
Sent.  Update us if not better.  Thanks.  

## 2016-11-25 NOTE — Telephone Encounter (Signed)
Pt left v/m requesting status of refill requested by pharmacy for nystatin; pt has breaking out in mouth. Pt request cb when refilled. Pt would like to pick up today. Last f/u 08/17/16.Please advise.

## 2016-11-25 NOTE — Telephone Encounter (Signed)
Patient advised.

## 2016-12-05 ENCOUNTER — Other Ambulatory Visit: Payer: Self-pay | Admitting: Family Medicine

## 2016-12-05 DIAGNOSIS — E118 Type 2 diabetes mellitus with unspecified complications: Secondary | ICD-10-CM

## 2016-12-07 DIAGNOSIS — Z9882 Breast implant status: Secondary | ICD-10-CM | POA: Diagnosis not present

## 2016-12-09 ENCOUNTER — Ambulatory Visit (INDEPENDENT_AMBULATORY_CARE_PROVIDER_SITE_OTHER): Payer: Medicare HMO

## 2016-12-09 VITALS — BP 110/80 | HR 91 | Temp 98.5°F | Ht 63.75 in | Wt 180.2 lb

## 2016-12-09 DIAGNOSIS — Z0000 Encounter for general adult medical examination without abnormal findings: Secondary | ICD-10-CM

## 2016-12-09 DIAGNOSIS — E118 Type 2 diabetes mellitus with unspecified complications: Secondary | ICD-10-CM | POA: Diagnosis not present

## 2016-12-09 LAB — COMPREHENSIVE METABOLIC PANEL
ALBUMIN: 3.8 g/dL (ref 3.5–5.2)
ALT: 17 U/L (ref 0–35)
AST: 29 U/L (ref 0–37)
Alkaline Phosphatase: 50 U/L (ref 39–117)
BUN: 16 mg/dL (ref 6–23)
CALCIUM: 9.5 mg/dL (ref 8.4–10.5)
CHLORIDE: 105 meq/L (ref 96–112)
CO2: 29 mEq/L (ref 19–32)
CREATININE: 0.88 mg/dL (ref 0.40–1.20)
GFR: 80.6 mL/min (ref >60.00)
Glucose, Bld: 104 mg/dL — ABNORMAL HIGH (ref 70–99)
POTASSIUM: 4.1 meq/L (ref 3.5–5.1)
Sodium: 141 mEq/L (ref 135–145)
Total Bilirubin: 0.5 mg/dL (ref 0.2–1.2)
Total Protein: 6.6 g/dL (ref 6.0–8.3)

## 2016-12-09 LAB — HEMOGLOBIN A1C: HEMOGLOBIN A1C: 6.5 % (ref 4.6–6.5)

## 2016-12-09 LAB — TSH: TSH: 0.18 u[IU]/mL — AB (ref 0.35–4.50)

## 2016-12-09 NOTE — Progress Notes (Signed)
Pre visit review using our clinic review tool, if applicable. No additional management support is needed unless otherwise documented below in the visit note. 

## 2016-12-09 NOTE — Progress Notes (Signed)
Subjective:   Modest L Carter is a 76 y.o. female who presents for Medicare Annual (Subsequent) preventive examination.  Review of Systems:  [name] Cardiac Risk Factors include: advanced age (>58men, >13 women);diabetes mellitus;obesity (BMI >30kg/m2);dyslipidemia;hypertension     Objective:     Vitals: BP 110/80 (BP Location: Left Arm, Patient Position: Sitting, Cuff Size: Normal)   Pulse 91   Temp 98.5 F (36.9 C) (Oral)   Ht 5' 3.75" (1.619 m) Comment: no shoes  Wt 180 lb 4 oz (81.8 kg)   SpO2 94%   BMI 31.18 kg/m   Body mass index is 31.18 kg/m.   Tobacco History  Smoking Status  . Former Smoker  Smokeless Tobacco  . 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 adenomatous Colon polyps    colonoscopy 8/99, 12/02, 8/06  . Arthritis   . Bell's palsy   . Cancer [facility])    DCIS left breast  . Chest pain    uses NTG as needed  . Depression   . Diabetes mellitus without complication ([place])   . Diverticulosis of colon 06/2005  . Ductal carcinoma in situ (DCIS) of left breast   . Family history of adverse reaction to anesthesia    " my mother didn't wake up so they had to put her on life support for about an hour or more; my sisiter has problems waking up too from anesthesia.  Marland Kitchen GERD (gastroesophageal reflux disease)   . H/O hiatal hernia   . Headache(784.0)   . Hyperglycemia   . Hyperlipidemia   . Hypertension   . Hypothyroid   . Insomnia   . Irritable bowel   . Menopausal symptoms   . Pericarditis 1980's  . Stroke ([place])   . TIA (transient ischemic attack)    09/2014   Past Surgical History:  Procedure Laterality Date  . APPENDECTOMY    . BREAST RECONSTRUCTION WITH PLACEMENT OF TISSUE EXPANDER AND FLEX HD (ACELLULAR HYDRATED DERMIS) Left 08/06/2015   Procedure: LEFT BREAST RECONSTRUCTION WITH PLACEMENT OF SALINE IMPLANT AND ACELLULAR DURMAL MATRIX;  Surgeon: Crissie Reese, MD;  Location: [place];  Service: Plastics;   Laterality: Left;  . CARDIAC CATHETERIZATION    . CATARACT EXTRACTION Bilateral 2016  . CHOLECYSTECTOMY  1990  . CYSTECTOMY  05/04/08   left lower arm, benign  . ECTOPIC PREGNANCY SURGERY  1971  . FRACTURE SURGERY     nose  . NIPPLE SPARING MASTECTOMY Left 08/06/2015   Procedure: LEFT NIPPLE SPARING MASTECTOMY;  Surgeon: [name], MD;  Location: [place];  Service: General;  Laterality: Left;  . ORIF ANKLE FRACTURE Left 10/23/2013   Procedure: OPEN REDUCTION INTERNAL FIXATION (ORIF) LEFT ANKLE FRACTURE;  Surgeon: Marianna Payment, MD;  Location: [place];  Service: Orthopedics;  Laterality: Left;  . PARTIAL HYSTERECTOMY     ovaries intact  . PARTIAL MASTECTOMY WITH NEEDLE LOCALIZATION AND AXILLARY SENTINEL LYMPH NODE BX Left 05/06/2015   Procedure: LEFT BREAST PARTIAL MASTECTOMY WITH NEEDLE LOCALIZATION AND SENTINEL LYMPH NODE MAPPING;  Surgeon: [name], MD;  Location: [place];  Service: General;  Laterality: Left;  . TONSILLECTOMY    . TUBAL LIGATION     Family History  Problem Relation Age of Onset  . Cancer Father     stomach   . Heart disease Son     MI  . Arthritis Mother   . Diabetes Mother   . Cancer Brother  lung cancer  . Colon cancer Neg Hx   . Pancreatic cancer Neg Hx   . Stomach cancer Neg Hx   . Breast cancer Neg Hx    History  Sexual Activity  . Sexual activity: No    Outpatient Encounter Prescriptions as of 12/09/2016  Medication Sig  . amitriptyline (ELAVIL) 50 MG tablet TAKE 1-2 TABLETS (50-100 MG TOTAL) BY MOUTH AT BEDTIME AS NEEDED FOR SLEEP.  Marland Kitchen amLODipine (NORVASC) 5 MG tablet TAKE 1 TABLET BY MOUTH DAILY  . aspirin EC 81 MG tablet Take 81 mg by mouth daily.  . Aspirin-Salicylamide-Caffeine (BC HEADACHE POWDER PO) Take 1 Package by mouth daily as needed (pain).   Marland Kitchen diclofenac (VOLTAREN) 75 MG EC tablet Take 1 tablet (75 mg total) by mouth 2 (two) times daily.  . fluconazole (DIFLUCAN) 200 MG tablet Take 1 tablet (200 mg total)  by mouth every other day.  Marland Kitchen glipiZIDE (GLUCOTROL) 5 MG tablet Take 0.5 tablets (2.5 mg total) by mouth daily before breakfast.  . levothyroxine (SYNTHROID, LEVOTHROID) 100 MCG tablet TAKE ONE TABLET BY MOUTH DAILY EXCEPT ON MONDAY AND WEDNESDAY TAKE 1/2 TABLET  . losartan (COZAAR) 100 MG tablet Take 100 mg by mouth.  . magnesium oxide (MAG-OX) 400 (241.3 Mg) MG tablet TAKE 1 TABLET (400 MG TOTAL) BY MOUTH DAILY.  . nitroGLYCERIN (NITROSTAT) 0.4 MG SL tablet PLACE 1 TABLET (0.4 MG TOTAL) UNDER THE TONGUE EVERY 5 (FIVE) MINUTES AS NEEDED FOR CHEST PAIN.  Marland Kitchen nystatin (MYCOSTATIN) 100000 UNIT/ML suspension TAKE 5 MLS (500,000 UNITS TOTAL) BY MOUTH 4 (FOUR) TIMES DAILY. SWISH AND SWALLOW.  Marland Kitchen omeprazole (PRILOSEC OTC) 20 MG tablet Take 20 mg by mouth daily.  Marland Kitchen oxybutynin (DITROPAN) 5 MG tablet Take 1 tablet (5 mg total) by mouth every 8 (eight) hours as needed for bladder spasms.  . tamoxifen (NOLVADEX) 20 MG tablet Take 1 tablet (20 mg total) by mouth daily.  Marland Kitchen terconazole (TERAZOL 7) 0.4 % vaginal cream Place 1 applicator vaginally at bedtime.  . traMADol (ULTRAM) 50 MG tablet TAKE 1 TABLET BY MOUTH EVERY 8 HOURS AS NEEDED  . VOLTAREN 1 % GEL Apply 2 g topically 2 (two) times daily as needed (pain).   . [DISCONTINUED] azithromycin (ZITHROMAX) 250 MG tablet 2 tabs a day for 1 day and then 1 a day for 4 days.   No facility-administered encounter medications on file as of 12/09/2016.     Activities of Daily Living In your present state of health, do you have any difficulty performing the following activities: 12/09/2016  Hearing? Y  Vision? N  Difficulty concentrating or making decisions? N  Walking or climbing stairs? N  Dressing or bathing? N  Doing errands, shopping? N  Preparing Food and eating ? N  Using the Toilet? N  In the past six months, have you accidently leaked urine? Y  Do you have problems with loss of bowel control? N  Managing your Medications? N  Managing your Finances? N    Housekeeping or managing your Housekeeping? N  Some recent data might be hidden    Patient Care Team: [name], MD as PCP - General Sylvan Cheese, NP as Nurse Practitioner (Hematology and Oncology) Chauncey Cruel, MD as Consulting Physician (Oncology) [name], MD as Consulting Physician (General Surgery) Crissie Reese, MD as Consulting Physician (Plastic Surgery)    Assessment:     Hearing Screening   125Hz  250Hz  500Hz  1000Hz  2000Hz  3000Hz  4000Hz  6000Hz  8000Hz   Right ear:  40 40 40  40    Left ear:   40 40 40  40    Vision Screening Comments: Last vision exam in Feb 2017   Exercise Activities and Dietary recommendations Current Exercise Habits: Home exercise routine, Type of exercise: walking, Time (Minutes): 60, Frequency (Times/Week): 3, Weekly Exercise (Minutes/Week): 180, Intensity: Mild, Exercise limited by: None identified  Goals    . Increase water intake          Starting 12/09/2016, I will drink at least 6-8 glasses of water.       Fall Risk Fall Risk  12/09/2016 03/28/2016 09/29/2015 07/04/2014 07/02/2013  Falls in the past year? Yes Yes Yes Yes Yes  Number falls in past yr: 2 or more 2 or more 2 or more - 1  Injury with Fall? No No No - Yes  Risk for fall due to : - - Other (Comment) - -  Risk for fall due to (comments): - - pt lose balance - -   Depression Screen PHQ 2/9 Scores 12/09/2016 09/29/2015 07/04/2014 07/02/2013  PHQ - 2 Score 0 0 2 6     Cognitive Function MMSE - Mini Mental State Exam 12/09/2016  Orientation to time 5  Orientation to Place 5  Registration 3  Attention/ Calculation 0  Recall 3  Language- name 2 objects 0  Language- repeat 1  Language- follow 3 step command 3  Language- read & follow direction 0  Write a sentence 0  Copy design 0  Total score 20     PLEASE NOTE: A Mini-Cog screen was completed. Maximum score is 20. A value of 0 denotes this part of Folstein MMSE was not completed or the patient failed this  part of the Mini-Cog screening.   Mini-Cog Screening Orientation to Time - Max 5 pts Orientation to Place - Max 5 pts Registration - Max 3 pts Recall - Max 3 pts Language Repeat - Max 1 pts Language Follow 3 Step Command - Max 3 pts     Immunization History  Administered Date(s) Administered  . Influenza Split 10/11/2011, 09/27/2012  . Influenza Whole 09/16/2008  . Influenza,inj,Quad PF,36+ Mos 09/18/2013, 09/29/2014, 08/25/2015, 08/17/2016  . Pneumococcal Conjugate-13 07/04/2014  . Pneumococcal Polysaccharide-23 05/19/2011  . Td 01/30/2007   Screening Tests Health Maintenance  Topic Date Due  . FOOT EXAM  12/06/2017 (Originally 12/07/2016)  . ZOSTAVAX  12/09/2017 (Originally 02/09/2002)  . MAMMOGRAM  01/26/2017  . TETANUS/TDAP  01/29/2017  . HEMOGLOBIN A1C  02/14/2017  . OPHTHALMOLOGY EXAM  09/28/2017  . COLONOSCOPY  09/26/2018  . INFLUENZA VACCINE  Completed  . DEXA SCAN  Completed  . PNA vac Low Risk Adult  Completed      Plan:     I have personally reviewed and addressed the Medicare Annual Wellness questionnaire and have noted the following in the patient's chart:  A. Medical and social history B. Use of alcohol, tobacco or illicit drugs  C. Current medications and supplements D. Functional ability and status E.  Nutritional status F.  Physical activity G. Advance directives H. List of other physicians I.  Hospitalizations, surgeries, and ER visits in previous 12 months J.  [place] to include hearing, vision, cognitive, depression L. Referrals and appointments - none  In addition, I have reviewed and discussed with patient certain preventive protocols, quality metrics, and best practice recommendations. A written personalized care plan for preventive services as well as general preventive health recommendations were provided to patient.  See  attached scanned questionnaire for additional information.   Signed,   Lindell Noe, MHA, BS,  LPN Health Coach

## 2016-12-09 NOTE — Progress Notes (Signed)
PCP notes:   Health maintenance:  Eye exam - per pt, exam in Nov 2017 Mammogram - per pt, exam in March 2017 Foot exam - PCP will complete  Shingles - postponed/insurance  Abnormal screenings:   Fall risk - hx of multiple falls without injury  Patient concerns:   Pt has complaint of intermittent dizziness which she states is a factor in having falls.   Nurse concerns:  None  Next PCP appt:   12/16/16 @ 0945  I reviewed health advisor's note, was available for consultation on the day of service listed in this note, and agree with documentation and plan. [name], MD.

## 2016-12-09 NOTE — Patient Instructions (Signed)
Kelly Kennedy , Thank you for taking time to come for your Medicare Wellness Visit. I appreciate your ongoing commitment to your health goals. Please review the following plan we discussed and let me know if I can assist you in the future.   These are the goals we discussed: Goals    . Increase water intake          Starting 12/09/2016, I will drink at least 6-8 glasses of water.        This is a list of the screening recommended for you and due dates:  Health Maintenance  Topic Date Due  . Complete foot exam   12/06/2017*  . Shingles Vaccine  12/09/2017*  . Mammogram  01/26/2017  . Tetanus Vaccine  01/29/2017  . Hemoglobin A1C  02/14/2017  . Eye exam for diabetics  09/28/2017  . Colon Cancer Screening  09/26/2018  . Flu Shot  Completed  . DEXA scan (bone density measurement)  Completed  . Pneumonia vaccines  Completed  *Topic was postponed. The date shown is not the original due date.   Preventive Care for Adults  A healthy lifestyle and preventive care can promote health and wellness. Preventive health guidelines for adults include the following key practices.  . A routine yearly physical is a good way to check with your health care provider about your health and preventive screening. It is a chance to share any concerns and updates on your health and to receive a thorough exam.  . Visit your dentist for a routine exam and preventive care every 6 months. Brush your teeth twice a day and floss once a day. Good oral hygiene prevents tooth decay and gum disease.  . The frequency of eye exams is based on your age, health, family medical history, use  of contact lenses, and other factors. Follow your health care provider's ecommendations for frequency of eye exams.  . Eat a healthy diet. Foods like vegetables, fruits, whole grains, low-fat dairy products, and lean protein foods contain the nutrients you need without too many calories. Decrease your intake of foods high in solid fats,  added sugars, and salt. Eat the right amount of calories for you. Get information about a proper diet from your health care provider, if necessary.  . Regular physical exercise is one of the most important things you can do for your health. Most adults should get at least 150 minutes of moderate-intensity exercise (any activity that increases your heart rate and causes you to sweat) each week. In addition, most adults need muscle-strengthening exercises on 2 or more days a week.  Silver Sneakers may be a benefit available to you. To determine eligibility, you may visit the website: www.silversneakers.com or contact program at (310)342-1135 Mon-Fri between 8AM-8PM.   . Maintain a healthy weight. The body mass index (BMI) is a screening tool to identify possible weight problems. It provides an estimate of body fat based on height and weight. Your health care provider can find your BMI and can help you achieve or maintain a healthy weight.   For adults 20 years and older: ? A BMI below 18.5 is considered underweight. ? A BMI of 18.5 to 24.9 is normal. ? A BMI of 25 to 29.9 is considered overweight. ? A BMI of 30 and above is considered obese.   . Maintain normal blood lipids and cholesterol levels by exercising and minimizing your intake of saturated fat. Eat a balanced diet with plenty of fruit and vegetables. Blood tests  for lipids and cholesterol should begin at age 27 and be repeated every 5 years. If your lipid or cholesterol levels are high, you are over 50, or you are at high risk for heart disease, you may need your cholesterol levels checked more frequently. Ongoing high lipid and cholesterol levels should be treated with medicines if diet and exercise are not working.  . If you smoke, find out from your health care provider how to quit. If you do not use tobacco, please do not start.  . If you choose to drink alcohol, please do not consume more than 2 drinks per day. One drink is  considered to be 12 ounces (355 mL) of beer, 5 ounces (148 mL) of wine, or 1.5 ounces (44 mL) of liquor.  . If you are 28-39 years old, ask your health care provider if you should take aspirin to prevent strokes.  . Use sunscreen. Apply sunscreen liberally and repeatedly throughout the day. You should seek shade when your shadow is shorter than you. Protect yourself by wearing long sleeves, pants, a wide-brimmed hat, and sunglasses year round, whenever you are outdoors.  . Once a month, do a whole body skin exam, using a mirror to look at the skin on your back. Tell your health care provider of new moles, moles that have irregular borders, moles that are larger than a pencil eraser, or moles that have changed in shape or color.

## 2016-12-13 ENCOUNTER — Other Ambulatory Visit: Payer: Self-pay | Admitting: *Deleted

## 2016-12-13 MED ORDER — MAGNESIUM OXIDE 400 (241.3 MG) MG PO TABS: ORAL_TABLET | ORAL | 11 refills | Status: DC

## 2016-12-15 ENCOUNTER — Other Ambulatory Visit: Payer: Self-pay | Admitting: Family Medicine

## 2016-12-16 ENCOUNTER — Encounter: Payer: Self-pay | Admitting: Family Medicine

## 2016-12-19 ENCOUNTER — Encounter: Payer: Self-pay | Admitting: Family Medicine

## 2016-12-19 ENCOUNTER — Ambulatory Visit (INDEPENDENT_AMBULATORY_CARE_PROVIDER_SITE_OTHER): Payer: Medicare HMO | Admitting: Family Medicine

## 2016-12-19 VITALS — BP 124/70 | HR 84 | Temp 98.7°F | Wt 180.0 lb

## 2016-12-19 DIAGNOSIS — N393 Stress incontinence (female) (male): Secondary | ICD-10-CM

## 2016-12-19 DIAGNOSIS — G4700 Insomnia, unspecified: Secondary | ICD-10-CM

## 2016-12-19 DIAGNOSIS — E118 Type 2 diabetes mellitus with unspecified complications: Secondary | ICD-10-CM

## 2016-12-19 DIAGNOSIS — R079 Chest pain, unspecified: Secondary | ICD-10-CM | POA: Diagnosis not present

## 2016-12-19 DIAGNOSIS — I10 Essential (primary) hypertension: Secondary | ICD-10-CM

## 2016-12-19 DIAGNOSIS — E039 Hypothyroidism, unspecified: Secondary | ICD-10-CM

## 2016-12-19 MED ORDER — GLIPIZIDE 5 MG PO TABS: 2.5000 mg | ORAL_TABLET | Freq: Every day | ORAL | 3 refills | Status: DC

## 2016-12-19 MED ORDER — AMITRIPTYLINE HCL 50 MG PO TABS: ORAL_TABLET | ORAL | 3 refills | Status: DC

## 2016-12-19 MED ORDER — MAGNESIUM OXIDE 400 (241.3 MG) MG PO TABS: ORAL_TABLET | ORAL | 3 refills | Status: DC

## 2016-12-19 MED ORDER — LEVOTHYROXINE SODIUM 75 MCG PO TABS: 75.0000 ug | ORAL_TABLET | Freq: Every day | ORAL | 3 refills | Status: DC

## 2016-12-19 NOTE — Progress Notes (Signed)
Hypertension:    Using medication without problems or lightheadedness: yes- she started taking BP meds at night and doesn't have ADE as is.   Chest pain with exertion:no, some occ R sided discomfort at rest- going on intermittently for months.  Noted more recently.  Taking NTG helps, drinking water helps.  No vomiting.   Edema:no Short of breath: some with exertion.  Going on for months.  Hasn't been walking as much now.    Advance directive- daughter [name] designated if patient were incapacitated  Diabetes:  Using medications without difficulties:yes Hypoglycemic episodes:no sx  Hyperglycemic episodes:no sx Feet problems: no Blood Sugars averaging:not checked often at home.   eye exam within last year: yes  Hypothyroidism.  TSH slightly low.  No dysphagia.  No neck pain.  Compliant with med.    Insomnia.  TCA helped.  No ADE on med.  She can't sleep well w/o it.  "I'll be up all night without it."  It also helps with occ pains/HA per patient report.  D/w pt.    She stopped ditropan and couldn't tell a difference.  Still with stress incontinence.  She wanted to talk to urology.  D/w pt.  She tried Kegel exercises.    Eye exam - per pt, exam in Nov 2017 Mammogram - per pt, exam in March 2017 Foot exam - to be done today.  See below.   Shingles - d/w pt.  See AVS.   Fall risk - hx of multiple falls without injury, d/w pt about cautions.    Meds, vitals, and allergies reviewed.   PMH and SH reviewed  ROS: Per HPI unless specifically indicated in ROS section   GEN: nad, alert and oriented HEENT: mucous membranes moist NECK: supple w/o LA CV: rrr. PULM: ctab, no inc wob ABD: soft, +bs EXT: no edema SKIN: no acute rash  Diabetic foot exam: Normal inspection No skin breakdown No calluses  Normal DP pulses Normal sensation to light touch and monofilament Nails normal  A1c d/w pt, at goal.  Labs d/w pt.

## 2016-12-19 NOTE — Patient Instructions (Addendum)
Rosaria Ferries will call about your referral (urology and cardiology). Check with your insurance to see if they will cover the shingles shot. See thyroid dose change.  Recheck thyroid with labs in about 3 months before a visit.  Take care.  Glad to see you.  Update me as needed.

## 2016-12-19 NOTE — Progress Notes (Signed)
Pre visit review using our clinic review tool, if applicable. No additional management support is needed unless otherwise documented below in the visit note. 

## 2016-12-20 DIAGNOSIS — N393 Stress incontinence (female) (male): Secondary | ICD-10-CM | POA: Insufficient documentation

## 2016-12-20 NOTE — Assessment & Plan Note (Signed)
A1c d/w pt, at goal.  Labs d/w pt. Continue as is.  Recheck periodically.

## 2016-12-20 NOTE — Assessment & Plan Note (Signed)
Controlled but with NTG responsive CP.  May be due to esophageal spasm, but obligated for cards eval, refer.  No CP now.  Routine cautions given to patient.  EKG w/o acute changes. >25 minutes spent in face to face time with patient, >50% spent in counselling or coordination of care.

## 2016-12-20 NOTE — Assessment & Plan Note (Signed)
Change to 71mcg a day and recheck in about 3 months.

## 2016-12-20 NOTE — Assessment & Plan Note (Signed)
She stopped ditropan and couldn't tell a difference.  Still with stress incontinence.  She wanted to talk to urology.  D/w pt.  She tried Kegel exercises.

## 2016-12-20 NOTE — Assessment & Plan Note (Signed)
TCA helped.  No ADE on med.  She can't sleep well w/o it.  "I'll be up all night without it."  It also helps with occ pains/HA per patient report.  D/w pt.

## 2016-12-23 ENCOUNTER — Encounter: Payer: Self-pay | Admitting: Cardiology

## 2016-12-23 ENCOUNTER — Ambulatory Visit (INDEPENDENT_AMBULATORY_CARE_PROVIDER_SITE_OTHER): Payer: Medicare HMO | Admitting: Cardiology

## 2016-12-23 VITALS — BP 128/74 | HR 96 | Ht 63.0 in | Wt 181.5 lb

## 2016-12-23 DIAGNOSIS — E119 Type 2 diabetes mellitus without complications: Secondary | ICD-10-CM | POA: Diagnosis not present

## 2016-12-23 DIAGNOSIS — I10 Essential (primary) hypertension: Secondary | ICD-10-CM | POA: Diagnosis not present

## 2016-12-23 DIAGNOSIS — E785 Hyperlipidemia, unspecified: Secondary | ICD-10-CM | POA: Diagnosis not present

## 2016-12-23 DIAGNOSIS — Z853 Personal history of malignant neoplasm of breast: Secondary | ICD-10-CM

## 2016-12-23 DIAGNOSIS — R079 Chest pain, unspecified: Secondary | ICD-10-CM

## 2016-12-23 NOTE — Assessment & Plan Note (Signed)
She is on Losartan 100 mg QHS though "angioedema with ARB" is listed in her allergies.apprently not the case

## 2016-12-23 NOTE — Assessment & Plan Note (Signed)
?   History of TIA Nov 2015-Myoview and 30 day event negative then

## 2016-12-23 NOTE — Assessment & Plan Note (Signed)
Will order Lexsican

## 2016-12-23 NOTE — Progress Notes (Signed)
12/23/2016 Kelly Kennedy   1941/03/07  [id number]  Primary Physician [name], MD Primary Cardiologist: Dr Sallyanne Kuster  HPI:  76 y/o AA female with a past medical history significant for possible endocarditis in the 1980s (may be mis-charting of pericarditis), HTN, hyperlipidemia, type 2 NIDDM, and past palpitations (heart "turning over"). She was seen in Nov 2015 with suspected TIA.  MRI/MRA showed no evidence of major arterial problems, did show chronic small vessel ischemic disease. She had a normal nuclear stress test, EF 62% and a normal 30 day event monitor then.  She is in the office today for a one year check up. Her PCP handles her DM, HTN, lipids, and hypothyroidism. The pt tells me she had an episode of SSCP "like someone standing on my chest" while in church 2 weeks ago. She took a NTG. She has had these episodes in the past but they are not exertional or reproducible. She denies any associated nausea or radiation to her arms or jaw.    Current Outpatient Prescriptions  Medication Sig Dispense Refill  . amitriptyline (ELAVIL) 50 MG tablet TAKE 1-2 TABLETS (50-100 MG TOTAL) BY MOUTH AT BEDTIME AS NEEDED FOR SLEEP. 180 tablet 3  . amLODipine (NORVASC) 5 MG tablet TAKE 1 TABLET BY MOUTH DAILY 90 tablet 3  . aspirin EC 81 MG tablet Take 81 mg by mouth daily.    . Aspirin-Salicylamide-Caffeine (BC HEADACHE POWDER PO) Take 1 Package by mouth daily as needed (pain).     Marland Kitchen diclofenac (VOLTAREN) 75 MG EC tablet Take 1 tablet (75 mg total) by mouth 2 (two) times daily. 30 tablet 0  . fluconazole (DIFLUCAN) 200 MG tablet Take 1 tablet (200 mg total) by mouth every other day. 3 tablet 5  . glipiZIDE (GLUCOTROL) 5 MG tablet Take 0.5 tablets (2.5 mg total) by mouth daily before breakfast. 90 tablet 3  . levothyroxine (SYNTHROID, LEVOTHROID) 75 MCG tablet Take 1 tablet (75 mcg total) by mouth daily before breakfast. 90 tablet 3  . losartan (COZAAR) 100 MG tablet Take 100 mg by mouth.      . magnesium oxide (MAG-OX) 400 (241.3 Mg) MG tablet TAKE 1 TABLET (400 MG TOTAL) BY MOUTH DAILY. 90 tablet 3  . nitroGLYCERIN (NITROSTAT) 0.4 MG SL tablet PLACE 1 TABLET (0.4 MG TOTAL) UNDER THE TONGUE EVERY 5 (FIVE) MINUTES AS NEEDED FOR CHEST PAIN. 25 tablet 3  . nystatin (MYCOSTATIN) 100000 UNIT/ML suspension TAKE 5 MLS (500,000 UNITS TOTAL) BY MOUTH 4 (FOUR) TIMES DAILY. SWISH AND SWALLOW. 60 mL 1  . omeprazole (PRILOSEC OTC) 20 MG tablet Take 20 mg by mouth daily.    . tamoxifen (NOLVADEX) 20 MG tablet Take 1 tablet (20 mg total) by mouth daily. 90 tablet 1  . terconazole (TERAZOL 7) 0.4 % vaginal cream Place 1 applicator vaginally at bedtime. 45 g 5  . traMADol (ULTRAM) 50 MG tablet TAKE 1 TABLET BY MOUTH EVERY 8 HOURS AS NEEDED 30 tablet 2  . VOLTAREN 1 % GEL Apply 2 g topically 2 (two) times daily as needed (pain).      No current facility-administered medications for this visit.     Allergies  Allergen Reactions  . Ace Inhibitors Swelling    H/o lip swelling with change in losartan pill  . Angiotensin Receptor Blockers Swelling    H/o lip swelling with change in losartan pill  . Statins Anaphylaxis    myalgias myalgias  . Niacin Rash  . Red Dye Rash  Social History   Social History  . Marital status: Widowed    Spouse name: [name]  . Number of children: 3  . Years of education: [name]   Occupational History  . retired, previous Engineer, manufacturing systems    Social History Main Topics  . Smoking status: Former Research scientist (life sciences)  . Smokeless tobacco: Former Systems developer  . Alcohol use No     Comment: social  . Drug use: No  . Sexual activity: No   Other Topics Concern  . Not on file   Social History Narrative   From [place].   3 children, one died from MI in 27-Feb-2005   Married Feb 28, 1959   Husband and brother died in the same week in Nov 29, 2012      Patient is right handed.   Patient has hs education.   Patient drinks 1 cup of soda daily, and green tea.     Review of Systems: General:  negative for chills, fever, night sweats or weight changes.  Cardiovascular: negative for chest pain, dyspnea on exertion, edema, orthopnea, palpitations, paroxysmal nocturnal dyspnea or shortness of breath Dermatological: negative for rash Respiratory: negative for cough or wheezing Urologic: negative for hematuria Abdominal: negative for nausea, vomiting, diarrhea, bright red blood per rectum, melena, or hematemesis Neurologic: negative for visual changes, syncope, or dizziness All other systems reviewed and are otherwise negative except as noted above.    Blood pressure 128/74, pulse 96, height 5\' 3"  (1.6 m), weight 181 lb 8 oz (82.3 kg).  General appearance: alert, cooperative and no distress Neck: no carotid bruit and no JVD Lungs: clear to auscultation bilaterally Heart: regular rate and rhythm Extremities: trace edema Skin: Skin color, texture, turgor normal. No rashes or lesions Neurologic: Grossly normal  EKG NSR  ASSESSMENT AND PLAN:   Chest pain with moderate risk of acute coronary syndrome Will order Lexsican  Dyslipidemia She stopped her statin Rx- recent LDL 110  Essential hypertension She is on Losartan 100 mg QHS though "angioedema with ARB" is listed in her allergies.apprently not the case  Non-insulin dependent type 2 diabetes mellitus ([place]) Followed by PCP  ? History of TIA (transient ischemic attack) ? History of TIA Nov 2015-Myoview and 30 day event negative then  History of breast cancer Surgery in '08 and Sept 2016   PLAN: Though I am not confident in her history, she has multiple cardiac risk factors including DM and I think its best to proceed with a Lexiscan Myoview. She is not interested in trying a statin at a lower dose. F/U Dr Sallyanne Kuster in a year if Myoview negative.  Kerin Ransom PA-C 12/23/2016 10:40 AM

## 2016-12-23 NOTE — Assessment & Plan Note (Signed)
She stopped her statin Rx- recent LDL 110

## 2016-12-23 NOTE — Assessment & Plan Note (Signed)
Followed by PCP

## 2016-12-23 NOTE — Assessment & Plan Note (Signed)
Surgery in '08 and Sept 2016

## 2016-12-23 NOTE — Patient Instructions (Signed)
Medication Instructions:  Your physician recommends that you continue on your current medications as directed. Please refer to the Current Medication list given to you today.  If you need a refill on your cardiac medications before your next appointment, please call your pharmacy.  Testing/Procedures: Your physician has requested that you have a lexiscan myoview. For further information please visit [email or web address]. Please follow instruction sheet, as given.  Follow-Up: Your physician wants you to follow-up in: [place] will receive a reminder letter in the mail two months in advance. If you don't receive a letter, please call our office to schedule the follow-up appointment.\   Thank you for choosing [facility] at [facility]!!    Sharyn Lull, [place], PA-C

## 2016-12-24 ENCOUNTER — Other Ambulatory Visit: Payer: Self-pay | Admitting: Family Medicine

## 2016-12-25 ENCOUNTER — Telehealth: Payer: Self-pay | Admitting: Family Medicine

## 2016-12-25 NOTE — Telephone Encounter (Signed)
Call pt.  Verify that she is still off losartan or lisinopril.  Shouldn't take either based on her hx.  Make sure she isn't on ACE or ARB inhibitor.  Thanks.

## 2016-12-26 NOTE — Telephone Encounter (Signed)
Noted, thanks, that what I was expecting, continue as is.

## 2016-12-26 NOTE — Telephone Encounter (Signed)
Spoke to patient by telephone and was advised that she is not taking Losartan and Lisinopril. Patient stated that the only medication that she is taking for her blood pressure is Amlodipine.

## 2016-12-26 NOTE — Telephone Encounter (Signed)
Patient notified as instructed by telephone and verbalized understanding. 

## 2016-12-30 ENCOUNTER — Telehealth (HOSPITAL_COMMUNITY): Payer: Self-pay

## 2016-12-30 NOTE — Telephone Encounter (Signed)
Encounter complete. 

## 2017-01-04 ENCOUNTER — Ambulatory Visit (HOSPITAL_COMMUNITY)
Admission: RE | Admit: 2017-01-04 | Discharge: 2017-01-04 | Disposition: A | Discharge status: Home / Self Care | Payer: Medicare HMO | Source: Ambulatory Visit | Attending: Cardiovascular Disease | Admitting: Cardiovascular Disease

## 2017-01-04 DIAGNOSIS — R079 Chest pain, unspecified: Secondary | ICD-10-CM | POA: Diagnosis not present

## 2017-01-04 LAB — MYOCARDIAL PERFUSION IMAGING
LV dias vol: 87 mL (ref 46–106)
LV sys vol: 42 mL
Peak HR: 111 {beats}/min
Rest HR: 88 {beats}/min
SDS: 3
SRS: 0
SSS: 3
TID: 1.16

## 2017-01-04 MED ORDER — REGADENOSON 0.4 MG/5ML IV SOLN
0.4000 mg | Freq: Once | INTRAVENOUS | Status: AC
  Administered 2017-01-04: 0.4 mg via INTRAVENOUS

## 2017-01-04 MED ORDER — TECHNETIUM TC 99M TETROFOSMIN IV KIT
10.9000 | PACK | Freq: Once | INTRAVENOUS | Status: AC | PRN
  Administered 2017-01-04: 10.9 via INTRAVENOUS
  Filled 2017-01-04: qty 11

## 2017-01-04 MED ORDER — TECHNETIUM TC 99M TETROFOSMIN IV KIT
31.8000 | PACK | Freq: Once | INTRAVENOUS | Status: AC | PRN
  Administered 2017-01-04: 31.8 via INTRAVENOUS
  Filled 2017-01-04: qty 32

## 2017-01-09 ENCOUNTER — Ambulatory Visit: Payer: Self-pay | Admitting: Cardiovascular Disease

## 2017-01-16 DIAGNOSIS — R350 Frequency of micturition: Secondary | ICD-10-CM | POA: Diagnosis not present

## 2017-01-16 DIAGNOSIS — R351 Nocturia: Secondary | ICD-10-CM | POA: Diagnosis not present

## 2017-01-16 DIAGNOSIS — N3946 Mixed incontinence: Secondary | ICD-10-CM | POA: Diagnosis not present

## 2017-01-18 ENCOUNTER — Other Ambulatory Visit (INDEPENDENT_AMBULATORY_CARE_PROVIDER_SITE_OTHER): Payer: Self-pay | Admitting: Orthopaedic Surgery

## 2017-01-18 DIAGNOSIS — R200 Anesthesia of skin: Secondary | ICD-10-CM

## 2017-01-19 NOTE — Telephone Encounter (Signed)
See below

## 2017-02-02 ENCOUNTER — Other Ambulatory Visit: Payer: Self-pay | Admitting: Surgery

## 2017-02-02 DIAGNOSIS — Z1231 Encounter for screening mammogram for malignant neoplasm of breast: Secondary | ICD-10-CM

## 2017-02-08 DIAGNOSIS — R350 Frequency of micturition: Secondary | ICD-10-CM | POA: Diagnosis not present

## 2017-02-08 DIAGNOSIS — N3946 Mixed incontinence: Secondary | ICD-10-CM | POA: Diagnosis not present

## 2017-02-20 ENCOUNTER — Telehealth: Payer: Self-pay

## 2017-02-20 NOTE — Telephone Encounter (Signed)
Received a faxed form from [facility] stating that the patient advised them that she is taking a Glipizide 5 mg, one daily and they show that she is taking 1/2 daily.  Pharmacy request a new script with correct directions.

## 2017-02-20 NOTE — Telephone Encounter (Signed)
Pt said at01/22/18 visit glipizide 5 mg was changed to one tab  Daily (not 1/2 tab). Pt has been thaking glipizide 5 mg daily and FBS has been averaging 195. Pt is not sure meter is working properly. Pt request refill glipizide to [facility].Please advise.

## 2017-02-21 MED ORDER — GLIPIZIDE 5 MG PO TABS: 5.0000 mg | ORAL_TABLET | Freq: Every day | ORAL | 3 refills | Status: DC

## 2017-02-21 NOTE — Telephone Encounter (Signed)
Home telephone number busy X 3. Left message on cell voicemail to call back.

## 2017-02-21 NOTE — Telephone Encounter (Signed)
rx sent. Continue as is.  Try to avoid carbs in diet.  Recheck A1c before next OV.  Have her bring meter to OV and we can calibrate it.  Thanks.

## 2017-02-23 ENCOUNTER — Ambulatory Visit
Admission: RE | Admit: 2017-02-23 | Discharge: 2017-02-23 | Disposition: A | Discharge status: Home / Self Care | Payer: Medicare HMO | Source: Ambulatory Visit | Attending: Surgery | Admitting: Surgery

## 2017-02-23 DIAGNOSIS — Z1231 Encounter for screening mammogram for malignant neoplasm of breast: Secondary | ICD-10-CM | POA: Diagnosis not present

## 2017-02-23 NOTE — Telephone Encounter (Signed)
Left message on answering machine at home number for patient to call back. 

## 2017-02-23 NOTE — Telephone Encounter (Signed)
Pt returned your call.  

## 2017-02-23 NOTE — Telephone Encounter (Signed)
Patient notified as instructed by telephone and verbalized understanding. Patient has appointments already scheduled in April 2018.

## 2017-03-07 DIAGNOSIS — Z86000 Personal history of in-situ neoplasm of breast: Secondary | ICD-10-CM | POA: Diagnosis not present

## 2017-03-08 ENCOUNTER — Other Ambulatory Visit: Payer: Self-pay | Admitting: Family Medicine

## 2017-03-09 ENCOUNTER — Ambulatory Visit (INDEPENDENT_AMBULATORY_CARE_PROVIDER_SITE_OTHER): Payer: Medicare HMO | Admitting: Family Medicine

## 2017-03-09 ENCOUNTER — Encounter: Payer: Self-pay | Admitting: Family Medicine

## 2017-03-09 VITALS — BP 130/78 | HR 94 | Temp 99.3°F | Wt 181.5 lb

## 2017-03-09 DIAGNOSIS — J208 Acute bronchitis due to other specified organisms: Secondary | ICD-10-CM

## 2017-03-09 DIAGNOSIS — B9689 Other specified bacterial agents as the cause of diseases classified elsewhere: Secondary | ICD-10-CM | POA: Diagnosis not present

## 2017-03-09 MED ORDER — AZITHROMYCIN 250 MG PO TABS: ORAL_TABLET | ORAL | 0 refills | Status: DC

## 2017-03-09 MED ORDER — BENZONATATE 100 MG PO CAPS: 100.0000 mg | ORAL_CAPSULE | Freq: Three times a day (TID) | ORAL | 0 refills | Status: DC | PRN

## 2017-03-09 NOTE — Progress Notes (Signed)
Pre visit review using our clinic review tool, if applicable. No additional management support is needed unless otherwise documented below in the visit note. 

## 2017-03-09 NOTE — Assessment & Plan Note (Signed)
Anticipate bacterial component given acute worsening after initial improvement. Cover with zapck antibiotic, tessalon perls for cough. Red flags to seek further or urgent care reviewed. Pt agrees with plan.

## 2017-03-09 NOTE — Patient Instructions (Addendum)
I think you have bronchitis. Treat with zpack, fluids and rest. May use tessalon perls as needed for cough (swallow, don't chew).  Watch for fever >101, worsening productive cough or shortness of breath and let us know if that happens.

## 2017-03-09 NOTE — Progress Notes (Signed)
BP 130/78   Pulse 94   Temp 99.3 F (37.4 C) (Oral)   Wt 181 lb 8 oz (82.3 kg)   SpO2 96%   BMI 32.15 kg/m    CC: cough, ST Subjective:    Patient ID: Kelly Kennedy, female    DOB: 08/24/1941, 76 y.o.   MRN: [id number]  HPI: Kelly Kennedy is a 76 y.o. female presenting on 03/09/2017 for URI (cough,ST; x 1 week-worsened yesterday)   1+ wk h/o productive cough, chest congestion, ST, chills, acutely worse yesterday. Some dyspnea and wheezing.   No fevers, ear or tooth pain, abd pain, nausea, PNdrainage.  No sick contacts at home. Non smoker.  No h/o asthma.  Treating with BC powder.   Relevant past medical, surgical, family and social history reviewed and updated as indicated. Interim medical history since our last visit reviewed. Allergies and medications reviewed and updated. Outpatient Medications Prior to Visit  Medication Sig Dispense Refill  . amitriptyline (ELAVIL) 50 MG tablet TAKE 1-2 TABLETS (50-100 MG TOTAL) BY MOUTH AT BEDTIME AS NEEDED FOR SLEEP. 180 tablet 3  . amLODipine (NORVASC) 5 MG tablet TAKE 1 TABLET BY MOUTH DAILY 90 tablet 3  . aspirin EC 81 MG tablet Take 81 mg by mouth daily.    . Aspirin-Salicylamide-Caffeine (BC HEADACHE POWDER PO) Take 1 Package by mouth daily as needed (pain).     Marland Kitchen diclofenac (VOLTAREN) 75 MG EC tablet Take 1 tablet (75 mg total) by mouth 2 (two) times daily. 30 tablet 0  . fluconazole (DIFLUCAN) 200 MG tablet Take 1 tablet (200 mg total) by mouth every other day. 3 tablet 5  . glipiZIDE (GLUCOTROL) 5 MG tablet Take 1 tablet (5 mg total) by mouth daily before breakfast. 90 tablet 3  . levothyroxine (SYNTHROID, LEVOTHROID) 75 MCG tablet Take 1 tablet (75 mcg total) by mouth daily before breakfast. 90 tablet 3  . magnesium oxide (MAG-OX) 400 (241.3 Mg) MG tablet TAKE 1 TABLET (400 MG TOTAL) BY MOUTH DAILY. 90 tablet 3  . nitroGLYCERIN (NITROSTAT) 0.4 MG SL tablet PLACE 1 TABLET (0.4 MG TOTAL) UNDER THE TONGUE EVERY 5 (FIVE)  MINUTES AS NEEDED FOR CHEST PAIN. 25 tablet 3  . nystatin (MYCOSTATIN) 100000 UNIT/ML suspension TAKE 5 MLS (500,000 UNITS TOTAL) BY MOUTH 4 (FOUR) TIMES DAILY. SWISH AND SWALLOW. 60 mL 1  . omeprazole (PRILOSEC OTC) 20 MG tablet Take 20 mg by mouth daily.    . tamoxifen (NOLVADEX) 20 MG tablet Take 1 tablet (20 mg total) by mouth daily. 90 tablet 1  . terconazole (TERAZOL 7) 0.4 % vaginal cream Place 1 applicator vaginally at bedtime. 45 g 5  . traMADol (ULTRAM) 50 MG tablet TAKE 1 TABLET BY MOUTH EVERY 8 HOURS AS NEEDED 30 tablet 2  . VOLTAREN 1 % GEL APPLY 2-4 GRAMS TO AFFECTED AREA TWICE DAILY 100 g 3   No facility-administered medications prior to visit.      Per HPI unless specifically indicated in ROS section below Review of Systems     Objective:    BP 130/78   Pulse 94   Temp 99.3 F (37.4 C) (Oral)   Wt 181 lb 8 oz (82.3 kg)   SpO2 96%   BMI 32.15 kg/m   Wt Readings from Last 3 Encounters:  03/09/17 181 lb 8 oz (82.3 kg)  01/04/17 181 lb (82.1 kg)  12/23/16 181 lb 8 oz (82.3 kg)    Physical Exam  Constitutional: She appears well-developed and  well-nourished. No distress.  HENT:  Head: Normocephalic and atraumatic.  Right Ear: Hearing, tympanic membrane, external ear and ear canal normal.  Left Ear: Hearing, tympanic membrane, external ear and ear canal normal.  Nose: Mucosal edema present. No rhinorrhea. Right sinus exhibits maxillary sinus tenderness. Right sinus exhibits no frontal sinus tenderness. Left sinus exhibits maxillary sinus tenderness. Left sinus exhibits no frontal sinus tenderness.  Mouth/Throat: Uvula is midline, oropharynx is clear and moist and mucous membranes are normal. No oropharyngeal exudate, posterior oropharyngeal edema, posterior oropharyngeal erythema or tonsillar abscesses.  Eyes: Conjunctivae and EOM are normal. Pupils are equal, round, and reactive to light. No scleral icterus.  Neck: Normal range of motion. Neck supple.    Cardiovascular: Normal rate, regular rhythm, normal heart sounds and intact distal pulses.   No murmur heard. Pulmonary/Chest: Effort normal and breath sounds normal. No respiratory distress. She has no wheezes. She has no rales.  Faint rhonchi at bases  Lymphadenopathy:    She has no cervical adenopathy.  Skin: Skin is warm and dry. No rash noted.  Nursing note and vitals reviewed.     Assessment & Plan:   Problem List Items Addressed This Visit    Acute bacterial bronchitis - Primary    Anticipate bacterial component given acute worsening after initial improvement. Cover with zapck antibiotic, tessalon perls for cough. Red flags to seek further or urgent care reviewed. Pt agrees with plan.       Relevant Medications   azithromycin (ZITHROMAX) 250 MG tablet       Follow up plan: Return if symptoms worsen or fail to improve.  Ria Bush, MD

## 2017-03-13 ENCOUNTER — Other Ambulatory Visit: Payer: Self-pay | Admitting: Family Medicine

## 2017-03-13 ENCOUNTER — Other Ambulatory Visit: Payer: Self-pay | Admitting: *Deleted

## 2017-03-13 DIAGNOSIS — E119 Type 2 diabetes mellitus without complications: Secondary | ICD-10-CM

## 2017-03-13 MED ORDER — TAMOXIFEN CITRATE 20 MG PO TABS: 20.0000 mg | ORAL_TABLET | Freq: Every day | ORAL | 1 refills | Status: DC

## 2017-03-20 ENCOUNTER — Other Ambulatory Visit (INDEPENDENT_AMBULATORY_CARE_PROVIDER_SITE_OTHER): Payer: Medicare HMO

## 2017-03-20 DIAGNOSIS — E119 Type 2 diabetes mellitus without complications: Secondary | ICD-10-CM | POA: Diagnosis not present

## 2017-03-20 LAB — HEMOGLOBIN A1C: Hgb A1c MFr Bld: 6.8 % — ABNORMAL HIGH (ref 4.6–6.5)

## 2017-03-20 LAB — TSH: TSH: 0.54 u[IU]/mL (ref 0.35–4.50)

## 2017-03-22 ENCOUNTER — Ambulatory Visit (INDEPENDENT_AMBULATORY_CARE_PROVIDER_SITE_OTHER): Payer: Medicare HMO | Admitting: Family Medicine

## 2017-03-22 ENCOUNTER — Ambulatory Visit (INDEPENDENT_AMBULATORY_CARE_PROVIDER_SITE_OTHER)
Admission: RE | Admit: 2017-03-22 | Discharge: 2017-03-22 | Disposition: A | Discharge status: Home / Self Care | Payer: Medicare HMO | Source: Ambulatory Visit | Attending: Family Medicine | Admitting: Family Medicine

## 2017-03-22 ENCOUNTER — Encounter: Payer: Self-pay | Admitting: Family Medicine

## 2017-03-22 VITALS — BP 138/78 | HR 92 | Temp 98.7°F | Wt 180.5 lb

## 2017-03-22 DIAGNOSIS — M898X1 Other specified disorders of bone, shoulder: Secondary | ICD-10-CM | POA: Diagnosis not present

## 2017-03-22 DIAGNOSIS — E119 Type 2 diabetes mellitus without complications: Secondary | ICD-10-CM

## 2017-03-22 DIAGNOSIS — R059 Cough, unspecified: Secondary | ICD-10-CM

## 2017-03-22 DIAGNOSIS — M19011 Primary osteoarthritis, right shoulder: Secondary | ICD-10-CM | POA: Diagnosis not present

## 2017-03-22 DIAGNOSIS — R05 Cough: Secondary | ICD-10-CM | POA: Diagnosis not present

## 2017-03-22 DIAGNOSIS — E039 Hypothyroidism, unspecified: Secondary | ICD-10-CM

## 2017-03-22 DIAGNOSIS — Q740 Other congenital malformations of upper limb(s), including shoulder girdle: Secondary | ICD-10-CM

## 2017-03-22 LAB — POCT GLUCOSE (DEVICE FOR HOME USE): POC Glucose: 89 mg/dl (ref 70–99)

## 2017-03-22 MED ORDER — AZITHROMYCIN 250 MG PO TABS: ORAL_TABLET | ORAL | 0 refills | Status: DC

## 2017-03-22 NOTE — Progress Notes (Signed)
Pre visit review using our clinic review tool, if applicable. No additional management support is needed unless otherwise documented below in the visit note. 
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
## 2017-03-22 NOTE — Patient Instructions (Addendum)
Don't change your meds for now.  Plan on a recheck in about 4 months.  Labs ahead of time.  Update me as needed.  Take care.  Glad to see you.  Check on getting a new meter.  We'll contact you with your xray report.

## 2017-03-24 DIAGNOSIS — Q740 Other congenital malformations of upper limb(s), including shoulder girdle: Secondary | ICD-10-CM | POA: Insufficient documentation

## 2017-03-24 DIAGNOSIS — R059 Cough, unspecified: Secondary | ICD-10-CM | POA: Insufficient documentation

## 2017-03-24 DIAGNOSIS — R05 Cough: Secondary | ICD-10-CM | POA: Insufficient documentation

## 2017-03-24 NOTE — Assessment & Plan Note (Signed)
>  25 minutes spent in face to face time with patient, >50% spent in counselling or coordination of care. Discussed with patient at office visit. See notes on imaging.

## 2017-03-24 NOTE — Assessment & Plan Note (Signed)
Controlled. Labs discussed with patient. No thyromegaly. Continue as is. She agrees.

## 2017-03-24 NOTE — Assessment & Plan Note (Signed)
Reasonable control. Continue as is. No adverse effect on medications.

## 2017-03-24 NOTE — Assessment & Plan Note (Signed)
Nontoxic. Okay for outpatient follow-up. D/w pt to hold zmax rx in the meantime, use if cough is worse. She agrees.

## 2017-03-27 ENCOUNTER — Other Ambulatory Visit: Payer: Self-pay | Admitting: Family Medicine

## 2017-03-27 NOTE — Telephone Encounter (Signed)
Electronic refill request. Last office visit:   03/22/17   Last Filled:    30 tablet 2 06/22/2016  Please advise.

## 2017-03-27 NOTE — Telephone Encounter (Signed)
Please call in.  Thanks.   

## 2017-03-28 NOTE — Telephone Encounter (Signed)
Medication phoned to pharmacy.  

## 2017-03-29 ENCOUNTER — Telehealth: Payer: Self-pay | Admitting: Family Medicine

## 2017-03-29 DIAGNOSIS — Q740 Other congenital malformations of upper limb(s), including shoulder girdle: Secondary | ICD-10-CM

## 2017-03-29 DIAGNOSIS — M25519 Pain in unspecified shoulder: Secondary | ICD-10-CM

## 2017-03-29 NOTE — Telephone Encounter (Signed)
It is unlikely that her shoulder is out of socket.   It wasn't at the last OV.  If she fell/injured her shoulder in the meantime and she thinks it is dislocated, then go to ER.  If she wants ortho eval about her collar bone, then okay.  I put in that referral.  Thanks.

## 2017-03-29 NOTE — Telephone Encounter (Signed)
Patient notified as instructed by telephone and verbalized understanding. Patient stated that she fell Friday and can not raise her right arm and her shoulder hurts. Patient stated that she can not afford to go to the ER. Patient stated that she is not going about her collar bone.

## 2017-03-29 NOTE — Telephone Encounter (Signed)
Pt called - she is requesting a referral to orthopedics for shoulder pain, She states that she thinks the shoulder is out of socket.  Pt has appt to discuss this with you on 05/07 Thanks

## 2017-03-29 NOTE — Telephone Encounter (Signed)
Patient notified as instructed by telephone and verbalized understanding. Patient stated that she would rather go see the orthopedist. Patient stated that she has seen Dr. Erlinda Hong at [facility] ortho and would like to go ahead and see him. Spoke to [place] Therapist, nutritional) and she call today and try to get patient in with Dr. Erlinda Hong.

## 2017-03-29 NOTE — Telephone Encounter (Signed)
I stand corrected and I apologize.  I hate to hear she fell.  Keep the ortho referral going- I changed it.  Can she get in here to get checked? We can x ray her here and we'll see what we can do.  Okay to add on 6:15 today if needed.     Thanks.

## 2017-03-29 NOTE — Telephone Encounter (Signed)
Thanks

## 2017-04-03 ENCOUNTER — Ambulatory Visit (INDEPENDENT_AMBULATORY_CARE_PROVIDER_SITE_OTHER): Payer: Medicare HMO | Admitting: Orthopaedic Surgery

## 2017-04-03 ENCOUNTER — Encounter (INDEPENDENT_AMBULATORY_CARE_PROVIDER_SITE_OTHER): Payer: Self-pay | Admitting: Orthopaedic Surgery

## 2017-04-03 ENCOUNTER — Ambulatory Visit (INDEPENDENT_AMBULATORY_CARE_PROVIDER_SITE_OTHER): Payer: Medicare HMO

## 2017-04-03 DIAGNOSIS — M25511 Pain in right shoulder: Secondary | ICD-10-CM

## 2017-04-03 NOTE — Progress Notes (Signed)
Office Visit Note   Patient: Kelly Kennedy           Date of Birth: 27-May-1941           MRN: [id number] Visit Date: 04/03/2017              Requested by: [name], MD [address] [place], [place] [address] PCP: [name], MD   Assessment & Plan: Visit Diagnoses:  1. Acute pain of right shoulder     Plan: Overall impression is rotator cuff contusion versus rotator cuff tear. Subacromial injection was performed today. Hopefully this will give her some good relief. I would like to recheck her in 2 weeks. If not better May consider advanced imaging or physical therapy if she is partially better.  Follow-Up Instructions: Return in about 2 weeks (around 04/17/2017).   Orders:  Orders Placed This Encounter  Procedures  . XR Shoulder Right   No orders of the defined types were placed in this encounter.     Procedures: Large Joint Inj Date/Time: 04/03/2017 11:21 AM Performed by: Leandrew Koyanagi Authorized by: Leandrew Koyanagi   Consent Given by:  Patient Timeout: prior to procedure the correct patient, procedure, and site was verified   Indications:  Pain Location:  Shoulder Site:  R subacromial bursa Prep: patient was prepped and draped in usual sterile fashion   Needle Size:  22 G Approach:  Posterior Ultrasound Guidance: No   Fluoroscopic Guidance: No       Clinical Data: No additional findings.   Subjective: Chief Complaint  Patient presents with  . Right Shoulder - Pain    Patient 76 year old female who I treated about 2 years ago for ankle fracture who comes in with acute injury to her right shoulder. She landed on her elbow injury of the shoulder upward. She now has significant pain with movement of her arm away from the side of her body. She denies any radicular symptoms. She has a lot of pain elevating her arm.    Review of Systems  Constitutional: Negative.   HENT: Negative.   Eyes: Negative.   Respiratory: Negative.     Cardiovascular: Negative.   Endocrine: Negative.   Musculoskeletal: Negative.   Neurological: Negative.   Hematological: Negative.   Psychiatric/Behavioral: Negative.   All other systems reviewed and are negative.    Objective: Vital Signs: There were no vitals taken for this visit.  Physical Exam  Constitutional: She is oriented to person, place, and time. She appears well-developed and well-nourished.  Pulmonary/Chest: Effort normal.  Neurological: She is alert and oriented to person, place, and time.  Skin: Skin is warm. Capillary refill takes less than 2 seconds.  Psychiatric: She has a normal mood and affect. Her behavior is normal. Judgment and thought content normal.  Nursing note and vitals reviewed.   Ortho Exam Right shoulder exam shows 3 out of 5 strength with infraspinatus and 4 out of 5 with supraspinatus. Positive drop arm test. Negative impingement. Good painless range of motion near the side of the body. Specialty Comments:  No specialty comments available.  Imaging: No results found.   PMFS History: Patient Active Problem List   Diagnosis Date Noted  . Abnormal prominence of clavicle 03/24/2017  . Cough 03/24/2017  . SUI (stress urinary incontinence, female) 12/20/2016  . Urinary incontinence 10/12/2016  . Lower back pain 02/23/2016  . Diarrhea 11/12/2015  . Thrush 08/26/2015  . Neoplasm of left breast, primary tumor  staging category Tis: ductal carcinoma in situ (DCIS) 08/06/2015  . Breast cancer, female ([place]) 08/06/2015  . Lip swelling 07/21/2015  . Breast cancer of upper-outer quadrant of left female breast ([place]) 06/10/2015  . History of breast cancer 05/14/2015  . Acute pain of right shoulder 01/11/2015  . Left sided numbness 12/11/2014  . Dyslipidemia 12/11/2014  . Non-insulin dependent type 2 diabetes mellitus ([place]) 12/11/2014  . Vaginitis and vulvovaginitis 10/10/2014  . Chest pain with moderate risk of acute coronary syndrome 09/28/2014   . Paresthesia 09/28/2014  . Essential hypertension 09/28/2014  . ? History of TIA (transient ischemic attack) 09/28/2014  . Osteopenia 07/09/2014  . Advance care planning 07/06/2014  . Ankle fracture, left 10/23/2013  . Obesity, unspecified 07/03/2013  . Anxiety state, unspecified 07/03/2013  . Medicare annual wellness visit, subsequent 05/27/2012  . History of bone density study 05/20/2011  . MENOPAUSAL SYNDROME 06/15/2010  . Insomnia 06/15/2010  . GERD 09/16/2008  . Hypothyroidism 08/02/2007  . DEPENDENT EDEMA 08/02/2007  . SYSTOLIC MURMUR 26/94/8546  . ANGINA, HX OF 07/17/2007  . NICOTINE ADDICTION, IN REMISSION 07/17/2007  . HIATAL HERNIA 06/28/2005  . DIVERTICULOSIS, COLON W/O HEM 06/28/2005  . HEMORRHOIDS NOS, W/O COMPLICATIONS 27/01/5008   Past Medical History:  Diagnosis Date  . adenomatous Colon polyps    colonoscopy 8/99, 12/02, 8/06  . Arthritis   . Bell's palsy   . Cancer [facility])    DCIS left breast  . Chest pain    uses NTG as needed  . Depression   . Diabetes mellitus without complication ([place])   . Diverticulosis of colon 06/2005  . Ductal carcinoma in situ (DCIS) of left breast   . Family history of adverse reaction to anesthesia    " my mother didn't wake up so they had to put her on life support for about an hour or more; my sisiter has problems waking up too from anesthesia.  Marland Kitchen GERD (gastroesophageal reflux disease)   . H/O hiatal hernia   . Headache(784.0)   . Hyperglycemia   . Hyperlipidemia   . Hypertension   . Hypothyroid   . Insomnia   . Irritable bowel   . Menopausal symptoms   . Pericarditis 1980's  . Stroke ([place])   . TIA (transient ischemic attack)    09/2014    Family History  Problem Relation Age of Onset  . Cancer Father     stomach   . Stomach cancer Father   . Heart disease Son     MI  . Arthritis Mother   . Diabetes Mother   . Cancer Brother     lung cancer  . Colon cancer Neg Hx   . Pancreatic cancer Neg Hx   . Breast  cancer Neg Hx     Past Surgical History:  Procedure Laterality Date  . APPENDECTOMY    . BREAST RECONSTRUCTION WITH PLACEMENT OF TISSUE EXPANDER AND FLEX HD (ACELLULAR HYDRATED DERMIS) Left 08/06/2015   Procedure: LEFT BREAST RECONSTRUCTION WITH PLACEMENT OF SALINE IMPLANT AND ACELLULAR DURMAL MATRIX;  Surgeon: Crissie Reese, MD;  Location: [place];  Service: Plastics;  Laterality: Left;  . CARDIAC CATHETERIZATION    . CATARACT EXTRACTION Bilateral 2016  . CHOLECYSTECTOMY  1990  . CYSTECTOMY  05/04/08   left lower arm, benign  . ECTOPIC PREGNANCY SURGERY  1971  . FRACTURE SURGERY     nose  . NIPPLE SPARING MASTECTOMY Left 08/06/2015   Procedure: LEFT NIPPLE SPARING MASTECTOMY;  Surgeon: [name], MD;  [facility];  Service: General;  Laterality: Left;  . ORIF ANKLE FRACTURE Left 10/23/2013   Procedure: OPEN REDUCTION INTERNAL FIXATION (ORIF) LEFT ANKLE FRACTURE;  Surgeon: Marianna Payment, MD;  Location: [facility];  Service: Orthopedics;  Laterality: Left;  . PARTIAL HYSTERECTOMY     ovaries intact  . PARTIAL MASTECTOMY WITH NEEDLE LOCALIZATION AND AXILLARY SENTINEL LYMPH NODE BX Left 05/06/2015   Procedure: LEFT BREAST PARTIAL MASTECTOMY WITH NEEDLE LOCALIZATION AND SENTINEL LYMPH NODE MAPPING;  Surgeon: [name], MD;  Location: [facility];  Service: General;  Laterality: Left;  . TONSILLECTOMY    . TUBAL LIGATION     Social History   Occupational History  . retired, previous Engineer, manufacturing systems    Social History Main Topics  . Smoking status: Former Smoker    Quit date: 04/03/2017  . Smokeless tobacco: Former Systems developer     Comment: [place].  Marland Kitchen Alcohol use No     Comment: social  . Drug use: No  . Sexual activity: No

## 2017-04-07 DIAGNOSIS — R350 Frequency of micturition: Secondary | ICD-10-CM | POA: Diagnosis not present

## 2017-04-07 DIAGNOSIS — N3946 Mixed incontinence: Secondary | ICD-10-CM | POA: Diagnosis not present

## 2017-04-12 DIAGNOSIS — H26492 Other secondary cataract, left eye: Secondary | ICD-10-CM | POA: Diagnosis not present

## 2017-04-12 DIAGNOSIS — H11423 Conjunctival edema, bilateral: Secondary | ICD-10-CM | POA: Diagnosis not present

## 2017-04-12 DIAGNOSIS — Z7984 Long term (current) use of oral hypoglycemic drugs: Secondary | ICD-10-CM | POA: Diagnosis not present

## 2017-04-12 DIAGNOSIS — H04123 Dry eye syndrome of bilateral lacrimal glands: Secondary | ICD-10-CM | POA: Diagnosis not present

## 2017-04-12 DIAGNOSIS — H18413 Arcus senilis, bilateral: Secondary | ICD-10-CM | POA: Diagnosis not present

## 2017-04-12 DIAGNOSIS — H40023 Open angle with borderline findings, high risk, bilateral: Secondary | ICD-10-CM | POA: Diagnosis not present

## 2017-04-12 DIAGNOSIS — H40003 Preglaucoma, unspecified, bilateral: Secondary | ICD-10-CM | POA: Diagnosis not present

## 2017-04-12 DIAGNOSIS — H11153 Pinguecula, bilateral: Secondary | ICD-10-CM | POA: Diagnosis not present

## 2017-04-12 DIAGNOSIS — H353131 Nonexudative age-related macular degeneration, bilateral, early dry stage: Secondary | ICD-10-CM | POA: Diagnosis not present

## 2017-04-18 ENCOUNTER — Encounter (INDEPENDENT_AMBULATORY_CARE_PROVIDER_SITE_OTHER): Payer: Self-pay | Admitting: Orthopaedic Surgery

## 2017-04-18 ENCOUNTER — Ambulatory Visit (INDEPENDENT_AMBULATORY_CARE_PROVIDER_SITE_OTHER): Payer: Medicare HMO | Admitting: Orthopaedic Surgery

## 2017-04-18 DIAGNOSIS — M25511 Pain in right shoulder: Secondary | ICD-10-CM

## 2017-04-18 NOTE — Progress Notes (Signed)
Office Visit Note   Patient: Kelly Kennedy           Date of Birth: [birth year]           MRN: [id number] Visit Date: 04/18/2017              Requested by: [name], MD [address] [place], [place] [address] PCP: [name], MD   Assessment & Plan: Visit Diagnoses:  1. Acute pain of right shoulder     Plan: Patient has not responded as well as I would like from the injection. I recommend MRI to rule out rotator cuff tear. Follow-up after the MRI.  Follow-Up Instructions: Return in about 10 days (around 04/28/2017).   Orders:  No orders of the defined types were placed in this encounter.  No orders of the defined types were placed in this encounter.     Procedures: No procedures performed   Clinical Data: No additional findings.   Subjective: Chief Complaint  Patient presents with  . Right Shoulder - Follow-up, Pain    Patient follows up today for her right shoulder pain. The injection helped about 60%. She still has pain and discomfort with raising of her arm. She has a lot of difficulty with ADLs.    Review of Systems  Constitutional: Negative.   HENT: Negative.   Eyes: Negative.   Respiratory: Negative.   Cardiovascular: Negative.   Endocrine: Negative.   Musculoskeletal: Negative.   Neurological: Negative.   Hematological: Negative.   Psychiatric/Behavioral: Negative.   All other systems reviewed and are negative.    Objective: Vital Signs: There were no vitals taken for this visit.  Physical Exam  Constitutional: She is oriented to person, place, and time. She appears well-developed and well-nourished.  Pulmonary/Chest: Effort normal.  Neurological: She is alert and oriented to person, place, and time.  Skin: Skin is warm. Capillary refill takes less than 2 seconds.  Psychiatric: She has a normal mood and affect. Her behavior is normal. Judgment and thought content normal.  Nursing note and vitals reviewed.   Ortho  Exam Right shoulder exam shows painful elevation of her right arm and shoulder.  Specialty Comments:  No specialty comments available.  Imaging: No results found.   PMFS History: Patient Active Problem List   Diagnosis Date Noted  . Abnormal prominence of clavicle 03/24/2017  . Cough 03/24/2017  . SUI (stress urinary incontinence, female) 12/20/2016  . Urinary incontinence 10/12/2016  . Lower back pain 02/23/2016  . Diarrhea 11/12/2015  . Thrush 08/26/2015  . Neoplasm of left breast, primary tumor staging category Tis: ductal carcinoma in situ (DCIS) 08/06/2015  . Breast cancer, female ([place]) 08/06/2015  . Lip swelling 07/21/2015  . Breast cancer of upper-outer quadrant of left female breast ([place]) 06/10/2015  . History of breast cancer 05/14/2015  . Acute pain of right shoulder 01/11/2015  . Left sided numbness 12/11/2014  . Dyslipidemia 12/11/2014  . Non-insulin dependent type 2 diabetes mellitus ([place]) 12/11/2014  . Vaginitis and vulvovaginitis 10/10/2014  . Chest pain with moderate risk of acute coronary syndrome 09/28/2014  . Paresthesia 09/28/2014  . Essential hypertension 09/28/2014  . ? History of TIA (transient ischemic attack) 09/28/2014  . Osteopenia 07/09/2014  . Advance care planning 07/06/2014  . Ankle fracture, left 10/23/2013  . Obesity, unspecified 07/03/2013  . Anxiety state, unspecified 07/03/2013  . Medicare annual wellness visit, subsequent 05/27/2012  . History of bone density study 05/20/2011  . MENOPAUSAL SYNDROME  06/15/2010  . Insomnia 06/15/2010  . GERD 09/16/2008  . Hypothyroidism 08/02/2007  . DEPENDENT EDEMA 08/02/2007  . SYSTOLIC MURMUR 93/81/0175  . ANGINA, HX OF 07/17/2007  . NICOTINE ADDICTION, IN REMISSION 07/17/2007  . HIATAL HERNIA 06/28/2005  . DIVERTICULOSIS, COLON W/O HEM 06/28/2005  . HEMORRHOIDS NOS, W/O COMPLICATIONS 09/21/8526   Past Medical History:  Diagnosis Date  . adenomatous Colon polyps    colonoscopy 8/99, 12/02,  8/06  . Arthritis   . Bell's palsy   . Cancer [facility])    DCIS left breast  . Chest pain    uses NTG as needed  . Depression   . Diabetes mellitus without complication ([place])   . Diverticulosis of colon 06/2005  . Ductal carcinoma in situ (DCIS) of left breast   . Family history of adverse reaction to anesthesia    " my mother didn't wake up so they had to put her on life support for about an hour or more; my sisiter has problems waking up too from anesthesia.  Marland Kitchen GERD (gastroesophageal reflux disease)   . H/O hiatal hernia   . Headache(784.0)   . Hyperglycemia   . Hyperlipidemia   . Hypertension   . Hypothyroid   . Insomnia   . Irritable bowel   . Menopausal symptoms   . Pericarditis 1980's  . Stroke ([place])   . TIA (transient ischemic attack)    09/2014    Family History  Problem Relation Age of Onset  . Cancer Father        stomach   . Stomach cancer Father   . Heart disease Son        MI  . Arthritis Mother   . Diabetes Mother   . Cancer Brother        lung cancer  . Colon cancer Neg Hx   . Pancreatic cancer Neg Hx   . Breast cancer Neg Hx     Past Surgical History:  Procedure Laterality Date  . APPENDECTOMY    . BREAST RECONSTRUCTION WITH PLACEMENT OF TISSUE EXPANDER AND FLEX HD (ACELLULAR HYDRATED DERMIS) Left 08/06/2015   Procedure: LEFT BREAST RECONSTRUCTION WITH PLACEMENT OF SALINE IMPLANT AND ACELLULAR DURMAL MATRIX;  Surgeon: Crissie Reese, MD;  Location: [facility];  Service: Plastics;  Laterality: Left;  . CARDIAC CATHETERIZATION    . CATARACT EXTRACTION Bilateral 2016  . CHOLECYSTECTOMY  1990  . CYSTECTOMY  05/04/08   left lower arm, benign  . ECTOPIC PREGNANCY SURGERY  1971  . FRACTURE SURGERY     nose  . NIPPLE SPARING MASTECTOMY Left 08/06/2015   Procedure: LEFT NIPPLE SPARING MASTECTOMY;  Surgeon: [name], MD;  Location: [facility] [place];  Service: General;  Laterality: Left;  . ORIF ANKLE FRACTURE Left 10/23/2013   Procedure: OPEN REDUCTION INTERNAL FIXATION  (ORIF) LEFT ANKLE FRACTURE;  Surgeon: Marianna Payment, MD;  Location: [place];  Service: Orthopedics;  Laterality: Left;  . PARTIAL HYSTERECTOMY     ovaries intact  . PARTIAL MASTECTOMY WITH NEEDLE LOCALIZATION AND AXILLARY SENTINEL LYMPH NODE BX Left 05/06/2015   Procedure: LEFT BREAST PARTIAL MASTECTOMY WITH NEEDLE LOCALIZATION AND SENTINEL LYMPH NODE MAPPING;  Surgeon: [name], MD;  Location: [facility];  Service: General;  Laterality: Left;  . TONSILLECTOMY    . TUBAL LIGATION

## 2017-04-20 ENCOUNTER — Ambulatory Visit (INDEPENDENT_AMBULATORY_CARE_PROVIDER_SITE_OTHER): Payer: Medicare HMO | Admitting: Family Medicine

## 2017-04-20 ENCOUNTER — Encounter: Payer: Self-pay | Admitting: Family Medicine

## 2017-04-20 DIAGNOSIS — L989 Disorder of the skin and subcutaneous tissue, unspecified: Secondary | ICD-10-CM | POA: Insufficient documentation

## 2017-04-20 NOTE — Progress Notes (Signed)
She saw ortho about her R shoulder after a fall.  She was injected and that is helping the pain.  She has possibility of MRI if not getting a lot better, per ortho.  I'll defer, d/w pt.  She agrees.    Knot in R breast.  Noted after the fall.  It resolved, it went down, then then it came back again.  No drainage.  Minimal bruising prev, resolved now.  It isn't tender.  It was a little smaller than the size of a dime at maximum.    The knot isn't present now.    Prev neg mammogram done in 01/2017, d/w pt.    Meds, vitals, and allergies reviewed.   ROS: Per HPI unless specifically indicated in ROS section   nad No axillary, neck or occipital LA.  D/w pt.  Chaperoned exam.  Pain with R arm abduction.

## 2017-04-20 NOTE — Patient Instructions (Signed)
I think this is either a small cyst or a lymph node.   Either way, if it comes and goes it is likely not a problem.  If it comes and stays, or gets really painful, then let me know.  Take care.  Glad to see you.

## 2017-04-20 NOTE — Assessment & Plan Note (Signed)
Resolved now, see AVS.  Likely benign/reactive LA or possibly a cyst.  No intervention now.  She'll update me as needed.   She'll f/u with ortho re: shoulder.

## 2017-04-28 ENCOUNTER — Ambulatory Visit (INDEPENDENT_AMBULATORY_CARE_PROVIDER_SITE_OTHER): Payer: Medicare HMO | Admitting: Orthopaedic Surgery

## 2017-05-22 ENCOUNTER — Encounter (INDEPENDENT_AMBULATORY_CARE_PROVIDER_SITE_OTHER): Payer: Self-pay | Admitting: *Deleted

## 2017-06-06 ENCOUNTER — Ambulatory Visit (HOSPITAL_BASED_OUTPATIENT_CLINIC_OR_DEPARTMENT_OTHER): Payer: Medicare HMO | Admitting: Oncology

## 2017-06-06 VITALS — BP 149/88 | HR 96 | Temp 98.2°F | Resp 18 | Ht 63.0 in | Wt 180.1 lb

## 2017-06-06 DIAGNOSIS — Z170 Estrogen receptor positive status [ER+]: Secondary | ICD-10-CM

## 2017-06-06 DIAGNOSIS — Z79810 Long term (current) use of selective estrogen receptor modulators (SERMs): Secondary | ICD-10-CM | POA: Diagnosis not present

## 2017-06-06 DIAGNOSIS — D0512 Intraductal carcinoma in situ of left breast: Secondary | ICD-10-CM | POA: Diagnosis not present

## 2017-06-06 DIAGNOSIS — C50412 Malignant neoplasm of upper-outer quadrant of left female breast: Secondary | ICD-10-CM

## 2017-06-06 NOTE — Progress Notes (Signed)
[facility]  [phone] [phone]     ID: Kelly Kennedy DOB: 1941-05-23  MR#: [id number]  QQI#:[id number]  Patient Care Team: [name], MD as PCP - General Jake Shark, Johny Blamer, NP as Nurse Practitioner (Hematology and Oncology) Magrinat, Virgie Dad, MD as Consulting Physician (Oncology) [name], MD as Consulting Physician (General Surgery) Crissie Reese, MD as Consulting Physician (Plastic Surgery) PCP: [name], MD GYN: SU: [name] MD OTHER MD:  CHIEF COMPLAINT: Ductal carcinoma in situ  CURRENT TREATMENT: tamoxifen   BREAST CANCER HISTORY: From the original intake note:  "Kelly Kennedy" had routine screening mammography with tomography at the breast Center for 20 08/18/2015 showing possible new calcifications in the left breast. Left diagnostic mammography on 04/02/2015 showed pleomorphic calcifications measuring up to 8 mm in the upper outer quadrant. Biopsy of this area on 04/07/2015 showed ([id number]) ductal carcinoma in situ, low to intermediate grade, estrogen receptor 97% positive, progesterone receptor 97% positive, both with strong staining intensity.  A second group of calcifications, more anterior, also in the upper outer quadrant, was biopsied 04/22/2015 showing ([id number]) ductal carcinoma in situ, grade 2, estrogen receptor 100% positive, progesterone receptor 98% positive, both with strong staining intensity.  On 05/06/2015 the patient underwent left partial mastectomy which showed intermediate grade ductal carcinoma in situ extending over 7.9 cm. Both sentinel lymph nodes were clear. Margins were less than a millimeter anteriorly and posteriorly as well as laterally.  Given the many exceedingly close margins and the cosmetic defect already present, the patient has opted for left mastectomy with immediate reconstruction.   Her subsequent history is as detailed below.  INTERVAL  HISTORY: Kelly Kennedy returns today for follow-up of her noninvasive breast cancer. She continues on tamoxifen, generally with good tolerance. She does have hot flashes and they can be bothersome. She obtains a drug at a good price.   REVIEW OF SYSTEMS: Kelly Kennedy fell recently and injured her right shoulder. Plain films did not show any fracture. She tells me that sometimes about an hour after taking her blood sugar medication she feels sick. She then drink something sweet and feels better. She has not been checking her sugar at those times. She complains of insomnia, muscle cramps, ringing in her years, sinus problems, occasional epistaxis, problems with her dangers, difficulty swallowing, occasional mouth sores, poor circulation, shortness of breath with significant activity, a dry cough, heartburn especially at night, some diarrhea, some stress urinary incontinence, back pain, rare headaches, depression and feeling weak. A detailed review of systems was otherwise stable    PAST MEDICAL HISTORY: Past Medical History:  Diagnosis Date  . adenomatous Colon polyps    colonoscopy 8/99, 12/02, 8/06  . Arthritis   . Bell's palsy   . Cancer [facility])    DCIS left breast  . Chest pain    uses NTG as needed  . Depression   . Diabetes mellitus without complication ([place])   . Diverticulosis of colon 06/2005  . Ductal carcinoma in situ (DCIS) of left breast   . Family history of adverse reaction to anesthesia    " my mother didn't wake up so they had to put her on life support for about an hour or more; my sisiter has problems waking up too from anesthesia.  Marland Kitchen GERD (gastroesophageal reflux disease)   . H/O hiatal hernia   . Headache(784.0)   . Hyperglycemia   . Hyperlipidemia   . Hypertension   . Hypothyroid   .  Insomnia   . Irritable bowel   . Menopausal symptoms   . Pericarditis 1980's  . Stroke ([place])   . TIA (transient ischemic attack)    09/2014    PAST SURGICAL HISTORY: Past Surgical  History:  Procedure Laterality Date  . APPENDECTOMY    . BREAST RECONSTRUCTION WITH PLACEMENT OF TISSUE EXPANDER AND FLEX HD (ACELLULAR HYDRATED DERMIS) Left 08/06/2015   Procedure: LEFT BREAST RECONSTRUCTION WITH PLACEMENT OF SALINE IMPLANT AND ACELLULAR DURMAL MATRIX;  Surgeon: Crissie Reese, MD;  Location: [facility];  Service: Plastics;  Laterality: Left;  . CARDIAC CATHETERIZATION    . CATARACT EXTRACTION Bilateral 2016  . CHOLECYSTECTOMY  1990  . CYSTECTOMY  05/04/08   left lower arm, benign  . ECTOPIC PREGNANCY SURGERY  1971  . FRACTURE SURGERY     nose  . NIPPLE SPARING MASTECTOMY Left 08/06/2015   Procedure: LEFT NIPPLE SPARING MASTECTOMY;  Surgeon: [name], MD;  Location: [facility];  Service: General;  Laterality: Left;  . ORIF ANKLE FRACTURE Left 10/23/2013   Procedure: OPEN REDUCTION INTERNAL FIXATION (ORIF) LEFT ANKLE FRACTURE;  Surgeon: Marianna Payment, MD;  Location: [facility];  Service: Orthopedics;  Laterality: Left;  . PARTIAL HYSTERECTOMY     ovaries intact  . PARTIAL MASTECTOMY WITH NEEDLE LOCALIZATION AND AXILLARY SENTINEL LYMPH NODE BX Left 05/06/2015   Procedure: LEFT BREAST PARTIAL MASTECTOMY WITH NEEDLE LOCALIZATION AND SENTINEL LYMPH NODE MAPPING;  Surgeon: [name], MD;  Location: [facility];  Service: General;  Laterality: Left;  . TONSILLECTOMY    . TUBAL LIGATION      FAMILY HISTORY Family History  Problem Relation Age of Onset  . Cancer Father        stomach   . Stomach cancer Father   . Heart disease Son        MI  . Arthritis Mother   . Diabetes Mother   . Cancer Brother        lung cancer  . Colon cancer Neg Hx   . Pancreatic cancer Neg Hx   . Breast cancer Neg Hx    the patient's father died from stomach cancer at age 79. The patient's mother died from gangrene at the age of 5. The patient has one sister, 9 brothers. Her sister had breast cancer diagnosed after the age of 44. There is no history of ovarian cancer in the  family.  GYNECOLOGIC HISTORY:  No LMP recorded. Patient has had a hysterectomy. Menarche age 53, first live birth age 40, the patient is GX P3. She underwent hysterectomy more than 20 years ago. She does believe her ovaries were removed at that time. She was on hormone replacement until earlier this year.  SOCIAL HISTORY:  She used to work in a [employer] but is now retired. She is widowed--her husband died Christmas Day sometime ago. At home she lives with her sister Kelly Kennedy. One of the patient's sons died from a massive heart attack while playing basketball at age 57.. The patient's daughter [name] is currently unemployed. The patient has 4 grandchildren and at least 55 great-grandchildren. She attends a [employer] locally    ADVANCED DIRECTIVES: Not in place   HEALTH MAINTENANCE: Social History  Substance Use Topics  . Smoking status: Former Smoker    Quit date: 04/03/2017  . Smokeless tobacco: Former Systems developer     Comment: [place].  Marland Kitchen Alcohol use No     Comment: social  Colonoscopy: 09/09/2010, 09/26/2013  PAP:  Bone density: 2013?/Normal per patient report  Lipid panel:  Allergies  Allergen Reactions  . Ace Inhibitors Swelling    H/o lip swelling with change in losartan pill  . Angiotensin Receptor Blockers Swelling    H/o lip swelling with change in losartan pill  . Statins Anaphylaxis    myalgias myalgias  . Niacin Rash  . Red Dye Rash    Current Outpatient Prescriptions  Medication Sig Dispense Refill  . amitriptyline (ELAVIL) 50 MG tablet TAKE 1-2 TABLETS (50-100 MG TOTAL) BY MOUTH AT BEDTIME AS NEEDED FOR SLEEP. 180 tablet 3  . amLODipine (NORVASC) 5 MG tablet TAKE 1 TABLET BY MOUTH DAILY 90 tablet 3  . aspirin EC 81 MG tablet Take 81 mg by mouth daily.    . Aspirin-Salicylamide-Caffeine (BC HEADACHE POWDER PO) Take 1 Package by mouth daily as needed (pain).     Marland Kitchen diclofenac (VOLTAREN) 75 MG EC tablet Take 1 tablet (75 mg total) by mouth  2 (two) times daily. 30 tablet 0  . glipiZIDE (GLUCOTROL) 5 MG tablet Take 1 tablet (5 mg total) by mouth daily before breakfast. 90 tablet 3  . levothyroxine (SYNTHROID, LEVOTHROID) 75 MCG tablet Take 1 tablet (75 mcg total) by mouth daily before breakfast. 90 tablet 3  . magnesium oxide (MAG-OX) 400 (241.3 Mg) MG tablet TAKE 1 TABLET (400 MG TOTAL) BY MOUTH DAILY. 90 tablet 3  . nitroGLYCERIN (NITROSTAT) 0.4 MG SL tablet PLACE 1 TABLET (0.4 MG TOTAL) UNDER THE TONGUE EVERY 5 (FIVE) MINUTES AS NEEDED FOR CHEST PAIN. 25 tablet 3  . omeprazole (PRILOSEC OTC) 20 MG tablet Take 20 mg by mouth daily.    . tamoxifen (NOLVADEX) 20 MG tablet Take 1 tablet (20 mg total) by mouth daily. 90 tablet 1  . traMADol (ULTRAM) 50 MG tablet TAKE 1 TABLET EVERY 8 HOURS AS NEEDED FOR PAIN 30 tablet 2  . VOLTAREN 1 % GEL APPLY 2-4 GRAMS TO AFFECTED AREA TWICE DAILY 100 g 3   No current facility-administered medications for this visit.     OBJECTIVE: Middle-aged African-American woman Who appears stated age  76:   06/06/17 1623  BP: (!) 149/88  Pulse: 96  Resp: 18  Temp: 98.2 F (36.8 C)     Body mass index is 31.9 kg/m.    ECOG FS:1 - Symptomatic but completely ambulatory  Sclerae unicteric, pupils round and equal Oropharynx clear and moist No cervical or supraclavicular adenopathy Lungs no rales or rhonchi Heart regular rate and rhythm Abd soft, nontender, positive bowel sounds MSK no focal spinal tenderness, no upper extremity lymphedema Neuro: nonfocal, well oriented, appropriate affect Breasts: The right breast is benign. The left breast is undergone mastectomy and it has been reconstructed with an implant. There is no evidence of local recurrence. Both axillae are benign.     LAB RESULTS:  CMP     Component Value Date/Time   NA 141 12/09/2016 1226   NA 141 06/10/2015 1549   K 4.1 12/09/2016 1226   K 3.8 06/10/2015 1549   CL 105 12/09/2016 1226   CO2 29 12/09/2016 1226   CO2 26  06/10/2015 1549   GLUCOSE 104 (H) 12/09/2016 1226   GLUCOSE 111 06/10/2015 1549   GLUCOSE 113 (H) 12/11/2006 1253   BUN 16 12/09/2016 1226   BUN 16.6 06/10/2015 1549   CREATININE 0.88 12/09/2016 1226   CREATININE 1.3 (H) 06/10/2015 1549   CALCIUM 9.5 12/09/2016 1226   CALCIUM 9.3  06/10/2015 1549   PROT 6.6 12/09/2016 1226   PROT 7.0 06/10/2015 1549   ALBUMIN 3.8 12/09/2016 1226   ALBUMIN 3.7 06/10/2015 1549   AST 29 12/09/2016 1226   AST 30 06/10/2015 1549   ALT 17 12/09/2016 1226   ALT 31 06/10/2015 1549   ALKPHOS 50 12/09/2016 1226   ALKPHOS 66 06/10/2015 1549   BILITOT 0.5 12/09/2016 1226   BILITOT 0.30 06/10/2015 1549   GFRNONAA 47 (L) 11/10/2015 0323   GFRAA 54 (L) 11/10/2015 0323    INo results found for: SPEP, UPEP  Lab Results  Component Value Date   WBC 5.9 12/08/2015   NEUTROABS 2.6 12/08/2015   HGB 12.4 12/08/2015   HCT 38.0 12/08/2015   MCV 87.3 12/08/2015   PLT 314.0 12/08/2015      Chemistry      Component Value Date/Time   NA 141 12/09/2016 1226   NA 141 06/10/2015 1549   K 4.1 12/09/2016 1226   K 3.8 06/10/2015 1549   CL 105 12/09/2016 1226   CO2 29 12/09/2016 1226   CO2 26 06/10/2015 1549   BUN 16 12/09/2016 1226   BUN 16.6 06/10/2015 1549   CREATININE 0.88 12/09/2016 1226   CREATININE 1.3 (H) 06/10/2015 1549      Component Value Date/Time   CALCIUM 9.5 12/09/2016 1226   CALCIUM 9.3 06/10/2015 1549   ALKPHOS 50 12/09/2016 1226   ALKPHOS 66 06/10/2015 1549   AST 29 12/09/2016 1226   AST 30 06/10/2015 1549   ALT 17 12/09/2016 1226   ALT 31 06/10/2015 1549   BILITOT 0.5 12/09/2016 1226   BILITOT 0.30 06/10/2015 1549       No results found for: LABCA2  No components found for: LABCA125  No results for input(s): INR in the last 168 hours.  Urinalysis    Component Value Date/Time   COLORURINE YELLOW 11/10/2015 0332   APPEARANCEUR TURBID (A) 11/10/2015 0332   LABSPEC 1.019 11/10/2015 0332   PHURINE 5.5 11/10/2015 0332    GLUCOSEU NEGATIVE 11/10/2015 0332   HGBUR NEGATIVE 11/10/2015 0332   HGBUR negative 07/16/2010 1521   BILIRUBINUR neg 03/10/2016 1447   KETONESUR 15 (A) 11/10/2015 0332   PROTEINUR neg 03/10/2016 1447   PROTEINUR NEGATIVE 11/10/2015 0332   UROBILINOGEN negative 03/10/2016 1447   UROBILINOGEN 0.2 09/29/2014 0645   NITRITE neg 03/10/2016 1447   NITRITE NEGATIVE 11/10/2015 0332   LEUKOCYTESUR Negative 03/10/2016 1447    STUDIES: Unilateral right screening mammography 02/23/2017 found a breast density to be category B. There was no evidence of malignancy.  ASSESSMENT: 76 y.o. [place] woman status post left lumpectomy and sentinel lymph node sampling 05/06/2015 for extensive ductal carcinoma in situ, grade 2, estrogen and progesterone receptor strongly positive, with both sentinel lymph nodes negative, but exceedingly close margins  (1) left nipple sparing mastectomy with immediate implant reconstruction on 08/06/2015  (2) tamoxifen for breast cancer prevention started 09/29/2015  PLAN: Kelly Kennedy is now 2 years out from definitive surgery for her noninvasive breast cancer, with no evidence of disease recurrence. This is very favorable.  She continues to tolerate tamoxifen well aside from hot flashes problem. The plan is to continue tamoxifen for total of 5 years.  I am concerned about the possible hypoglycemia she may be experiencing. I suggested next time she takes her hypoglycemic medication she check her blood sugar an hour later and if it is under 70 she needs to let her primary care physician know.  Otherwise she will have  her next mammogram in March 2019 and she will see me again in one year. She knows to call for any other problems that may develop before the next visit.   Chauncey Cruel, MD   06/06/2017 4:27 PM

## 2017-07-10 ENCOUNTER — Telehealth: Payer: Self-pay | Admitting: *Deleted

## 2017-07-10 NOTE — Telephone Encounter (Signed)
Faxed form received for Diabetic testing supplies.  Fax placed in Dr. Josefine Class In Box.

## 2017-07-11 NOTE — Telephone Encounter (Signed)
I'll work on the hard copy.  Thanks.  

## 2017-07-14 NOTE — Telephone Encounter (Signed)
Faxed to company

## 2017-07-18 ENCOUNTER — Other Ambulatory Visit: Payer: Self-pay | Admitting: Family Medicine

## 2017-07-18 DIAGNOSIS — E119 Type 2 diabetes mellitus without complications: Secondary | ICD-10-CM

## 2017-07-25 ENCOUNTER — Other Ambulatory Visit (INDEPENDENT_AMBULATORY_CARE_PROVIDER_SITE_OTHER): Payer: Medicare HMO

## 2017-07-25 DIAGNOSIS — E119 Type 2 diabetes mellitus without complications: Secondary | ICD-10-CM | POA: Diagnosis not present

## 2017-07-25 LAB — HEMOGLOBIN A1C: Hgb A1c MFr Bld: 6.6 % — ABNORMAL HIGH (ref 4.6–6.5)

## 2017-07-27 ENCOUNTER — Ambulatory Visit (INDEPENDENT_AMBULATORY_CARE_PROVIDER_SITE_OTHER): Payer: Medicare HMO | Admitting: Family Medicine

## 2017-07-27 ENCOUNTER — Encounter: Payer: Self-pay | Admitting: Family Medicine

## 2017-07-27 VITALS — BP 116/70 | HR 80 | Temp 97.7°F | Wt 179.8 lb

## 2017-07-27 DIAGNOSIS — Z23 Encounter for immunization: Secondary | ICD-10-CM | POA: Diagnosis not present

## 2017-07-27 DIAGNOSIS — E119 Type 2 diabetes mellitus without complications: Secondary | ICD-10-CM

## 2017-07-27 NOTE — Progress Notes (Signed)
Diabetes:  Using medications without difficulties:off meds now.   Hypoglycemic episodes:no Hyperglycemic episodes:no Feet problems:no Blood Sugars averaging:100-125 usually, not higher off glipizide.   eye exam within last year: due in fall 2018 She wanted to get off glipizide.  She didn't feel good taking that medicine, felt dizzy and unstready.  She got off and felt better.  Her A1c is still reasonable.  D/w pt.   Diet and exercise d/w pt.  Walking for exercise, 1 hour at a time.    She had some abd pain at about 3AM the last two mornings.  It got better on its own.  No pain now.  No vomiting, no blood in stool.  No fevers.  She'll monitor sx and update me as needed.    Meds, vitals, and allergies reviewed.   ROS: Per HPI unless specifically indicated in ROS section   GEN: nad, alert and oriented HEENT: mucous membranes moist NECK: supple w/o LA CV: rrr. PULM: ctab, no inc wob ABD: soft, +bs, not ttp EXT: trace BLE edema

## 2017-07-27 NOTE — Patient Instructions (Addendum)
I would get a flu shot each fall.   Recheck labs in about 3 months, plan on a visit a few days after the labs.  If you have really high sugars in the meantime then let me know.   If the abdominal pain continues the let me know.  Take care.  Glad to see you.

## 2017-07-28 NOTE — Assessment & Plan Note (Signed)
She prev stopped glipizide.  She didn't feel good taking that medicine, felt dizzy and unstready.  She got off and felt better.  Her A1c is still reasonable.  D/w pt.   continue off medication and recheck in about 3 months. If she has significant sugar elevation in the meantime that she will let me know.

## 2017-08-29 ENCOUNTER — Other Ambulatory Visit: Payer: Self-pay | Admitting: Family Medicine

## 2017-09-09 ENCOUNTER — Other Ambulatory Visit: Payer: Self-pay | Admitting: Oncology

## 2017-09-18 DIAGNOSIS — I10 Essential (primary) hypertension: Secondary | ICD-10-CM | POA: Insufficient documentation

## 2017-09-18 DIAGNOSIS — E039 Hypothyroidism, unspecified: Secondary | ICD-10-CM | POA: Insufficient documentation

## 2017-09-18 DIAGNOSIS — Y929 Unspecified place or not applicable: Secondary | ICD-10-CM | POA: Insufficient documentation

## 2017-09-18 DIAGNOSIS — Y999 Unspecified external cause status: Secondary | ICD-10-CM | POA: Insufficient documentation

## 2017-09-18 DIAGNOSIS — S00511A Abrasion of lip, initial encounter: Secondary | ICD-10-CM | POA: Diagnosis not present

## 2017-09-18 DIAGNOSIS — X58XXXA Exposure to other specified factors, initial encounter: Secondary | ICD-10-CM | POA: Insufficient documentation

## 2017-09-18 DIAGNOSIS — Z853 Personal history of malignant neoplasm of breast: Secondary | ICD-10-CM | POA: Diagnosis not present

## 2017-09-18 DIAGNOSIS — Y939 Activity, unspecified: Secondary | ICD-10-CM | POA: Diagnosis not present

## 2017-09-18 DIAGNOSIS — Z79899 Other long term (current) drug therapy: Secondary | ICD-10-CM | POA: Diagnosis not present

## 2017-09-18 DIAGNOSIS — E119 Type 2 diabetes mellitus without complications: Secondary | ICD-10-CM | POA: Diagnosis not present

## 2017-09-18 DIAGNOSIS — S0993XA Unspecified injury of face, initial encounter: Secondary | ICD-10-CM | POA: Diagnosis present

## 2017-09-19 ENCOUNTER — Emergency Department (HOSPITAL_COMMUNITY)
Admission: EM | Admit: 2017-09-19 | Discharge: 2017-09-19 | Disposition: A | Discharge status: Home / Self Care | Payer: Medicare Other | Attending: Emergency Medicine | Admitting: Emergency Medicine

## 2017-09-19 ENCOUNTER — Encounter (HOSPITAL_COMMUNITY): Payer: Self-pay | Admitting: Emergency Medicine

## 2017-09-19 DIAGNOSIS — S00511A Abrasion of lip, initial encounter: Secondary | ICD-10-CM

## 2017-09-19 LAB — CBC
HCT: 36.7 % (ref 36.0–46.0)
Hemoglobin: 12.5 g/dL (ref 12.0–15.0)
MCH: 29.3 pg (ref 26.0–34.0)
MCHC: 34.1 g/dL (ref 30.0–36.0)
MCV: 85.9 fL (ref 78.0–100.0)
PLATELETS: 357 10*3/uL (ref 150–400)
RBC: 4.27 MIL/uL (ref 3.87–5.11)
RDW: 14.3 % (ref 11.5–15.5)
WBC: 5.6 10*3/uL (ref 4.0–10.5)

## 2017-09-19 NOTE — ED Notes (Signed)
Patients lip is not bleeding.

## 2017-09-19 NOTE — ED Triage Notes (Signed)
Patient was eating at home. Her lip started bleeding for no reason. Patient states she did not bite her lip.

## 2017-09-19 NOTE — ED Notes (Signed)
Placed gauze on patients lip and told patient to hold pressure.

## 2017-09-19 NOTE — ED Provider Notes (Signed)
[facility] DEPT Provider Note   CSN: [id number] Arrival date & time: 09/18/17  2328     History   Chief Complaint Chief Complaint  Patient presents with  . Lip Laceration    HPI Kelly Kennedy is a 76 y.o. female.  Patient presents to the ER for evaluation of bleeding from her lip.  Patient reports that she was eating earlier and then suddenly noticed that she was bleeding.  She reports that it was coming from the lower lip.  Bleeding has since stopped.  She denies biting her lip or injuring it in any way.  She has not had any easy bruising or other types of bleeding recently.  She does not take a blood thinner.      Past Medical History:  Diagnosis Date  . adenomatous Colon polyps    colonoscopy 8/99, 12/02, 8/06  . Arthritis   . Bell's palsy   . Cancer [facility])    DCIS left breast  . Chest pain    uses NTG as needed  . Depression   . Diabetes mellitus without complication ([place])   . Diverticulosis of colon 06/2005  . Ductal carcinoma in situ (DCIS) of left breast   . Family history of adverse reaction to anesthesia    " my mother didn't wake up so they had to put her on life support for about an hour or more; my sisiter has problems waking up too from anesthesia.  Marland Kitchen GERD (gastroesophageal reflux disease)   . H/O hiatal hernia   . Headache(784.0)   . Hyperglycemia   . Hyperlipidemia   . Hypertension   . Hypothyroid   . Insomnia   . Irritable bowel   . Menopausal symptoms   . Pericarditis 1980's  . Stroke ([place])   . TIA (transient ischemic attack)    09/2014    Patient Active Problem List   Diagnosis Date Noted  . Skin lesion 04/20/2017  . Abnormal prominence of clavicle 03/24/2017  . Cough 03/24/2017  . SUI (stress urinary incontinence, female) 12/20/2016  . Urinary incontinence 10/12/2016  . Lower back pain 02/23/2016  . Diarrhea 11/12/2015  . Thrush 08/26/2015  . Neoplasm of left breast, primary tumor staging category  Tis: ductal carcinoma in situ (DCIS) 08/06/2015  . Breast cancer, female ([place]) 08/06/2015  . Lip swelling 07/21/2015  . Breast cancer of upper-outer quadrant of left female breast ([place]) 06/10/2015  . History of breast cancer 05/14/2015  . Acute pain of right shoulder 01/11/2015  . Left sided numbness 12/11/2014  . Dyslipidemia 12/11/2014  . Non-insulin dependent type 2 diabetes mellitus ([place]) 12/11/2014  . Vaginitis and vulvovaginitis 10/10/2014  . Chest pain with moderate risk of acute coronary syndrome 09/28/2014  . Paresthesia 09/28/2014  . Essential hypertension 09/28/2014  . ? History of TIA (transient ischemic attack) 09/28/2014  . Osteopenia 07/09/2014  . Advance care planning 07/06/2014  . Ankle fracture, left 10/23/2013  . Obesity, unspecified 07/03/2013  . Anxiety state, unspecified 07/03/2013  . Medicare annual wellness visit, subsequent 05/27/2012  . History of bone density study 05/20/2011  . MENOPAUSAL SYNDROME 06/15/2010  . Insomnia 06/15/2010  . GERD 09/16/2008  . Hypothyroidism 08/02/2007  . DEPENDENT EDEMA 08/02/2007  . SYSTOLIC MURMUR 27/01/5008  . ANGINA, HX OF 07/17/2007  . NICOTINE ADDICTION, IN REMISSION 07/17/2007  . HIATAL HERNIA 06/28/2005  . DIVERTICULOSIS, COLON W/O HEM 06/28/2005  . HEMORRHOIDS NOS, W/O COMPLICATIONS 38/18/2993    Past Surgical History:  Procedure Laterality Date  .  APPENDECTOMY    . BREAST RECONSTRUCTION WITH PLACEMENT OF TISSUE EXPANDER AND FLEX HD (ACELLULAR HYDRATED DERMIS) Left 08/06/2015   Procedure: LEFT BREAST RECONSTRUCTION WITH PLACEMENT OF SALINE IMPLANT AND ACELLULAR DURMAL MATRIX;  Surgeon: Crissie Reese, MD;  Location: [facility];  Service: Plastics;  Laterality: Left;  . CARDIAC CATHETERIZATION    . CATARACT EXTRACTION Bilateral 2016  . CHOLECYSTECTOMY  1990  . CYSTECTOMY  05/04/08   left lower arm, benign  . ECTOPIC PREGNANCY SURGERY  1971  . FRACTURE SURGERY     nose  . NIPPLE SPARING MASTECTOMY Left 08/06/2015    Procedure: LEFT NIPPLE SPARING MASTECTOMY;  Surgeon: [name], MD;  Location: [facility];  Service: General;  Laterality: Left;  . ORIF ANKLE FRACTURE Left 10/23/2013   Procedure: OPEN REDUCTION INTERNAL FIXATION (ORIF) LEFT ANKLE FRACTURE;  Surgeon: Marianna Payment, MD;  Location: [facility];  Service: Orthopedics;  Laterality: Left;  . PARTIAL HYSTERECTOMY     ovaries intact  . PARTIAL MASTECTOMY WITH NEEDLE LOCALIZATION AND AXILLARY SENTINEL LYMPH NODE BX Left 05/06/2015   Procedure: LEFT BREAST PARTIAL MASTECTOMY WITH NEEDLE LOCALIZATION AND SENTINEL LYMPH NODE MAPPING;  Surgeon: [name], MD;  Location: [facility];  Service: General;  Laterality: Left;  . TONSILLECTOMY    . TUBAL LIGATION      OB History    No data available       Home Medications    Prior to Admission medications   Medication Sig Start Date End Date Taking? Authorizing Provider  amitriptyline (ELAVIL) 50 MG tablet TAKE 1-2 TABLETS (50-100 MG TOTAL) BY MOUTH AT BEDTIME AS NEEDED FOR SLEEP. 12/19/16   [name], MD  amLODipine (NORVASC) 5 MG tablet TAKE 1 TABLET BY MOUTH DAILY 08/29/17   [name], MD  aspirin EC 81 MG tablet Take 81 mg by mouth daily.    [provider]  Aspirin-Salicylamide-Caffeine (BC HEADACHE POWDER PO) Take 1 Package by mouth daily as needed (pain).     [provider]  diclofenac (VOLTAREN) 75 MG EC tablet Take 1 tablet (75 mg total) by mouth 2 (two) times daily. 02/23/16   Jinny Sanders, MD  levothyroxine (SYNTHROID, LEVOTHROID) 75 MCG tablet Take 1 tablet (75 mcg total) by mouth daily before breakfast. 12/19/16   [name], MD  magnesium oxide (MAG-OX) 400 (241.3 Mg) MG tablet TAKE 1 TABLET (400 MG TOTAL) BY MOUTH DAILY. 12/19/16   [name], MD  nitroGLYCERIN (NITROSTAT) 0.4 MG SL tablet PLACE 1 TABLET (0.4 MG TOTAL) UNDER THE TONGUE EVERY 5 (FIVE) MINUTES AS NEEDED FOR CHEST PAIN. 03/08/17   [name], MD  omeprazole  (PRILOSEC OTC) 20 MG tablet Take 20 mg by mouth daily.    [provider]  tamoxifen (NOLVADEX) 20 MG tablet TAKE 1 TABLET (20 MG TOTAL) BY MOUTH DAILY. 09/11/17   Magrinat, Virgie Dad, MD  traMADol (ULTRAM) 50 MG tablet TAKE 1 TABLET EVERY 8 HOURS AS NEEDED FOR PAIN 03/27/17   [name], MD  VOLTAREN 1 % GEL APPLY 2-4 GRAMS TO AFFECTED AREA TWICE DAILY 01/19/17   Leandrew Koyanagi, MD    Family History Family History  Problem Relation Age of Onset  . Cancer Father        stomach   . Stomach cancer Father   . Heart disease Son        MI  . Arthritis Mother   . Diabetes Mother   . Cancer  Brother        lung cancer  . Colon cancer Neg Hx   . Pancreatic cancer Neg Hx   . Breast cancer Neg Hx     Social History Social History  Substance Use Topics  . Smoking status: Former Smoker    Quit date: 04/03/2017  . Smokeless tobacco: Former Systems developer     Comment: [place].  Marland Kitchen Alcohol use No     Comment: social     Allergies   Ace inhibitors; Angiotensin receptor blockers; Statins; Glipizide; Niacin; and Red dye   Review of Systems Review of Systems  HENT:       Bleeding  All other systems reviewed and are negative.    Physical Exam Updated Vital Signs BP (!) 155/97   Pulse 96   Resp 20   Ht 5\' 3"  (1.6 m)   Wt 81.6 kg (180 lb)   SpO2 100%   BMI 31.89 kg/m   Physical Exam  Constitutional: She is oriented to person, place, and time. She appears well-developed and well-nourished. No distress.  HENT:  Head: Normocephalic.  Right Ear: Hearing normal.  Left Ear: Hearing normal.  Nose: Nose normal.  Mouth/Throat: Oropharynx is clear and moist and mucous membranes are normal.  Slight swelling to the left side of the lower lip.  Possibly a slight abrasion to the intraoral mucosa of the lip versus abnormal blood vessel/lesion  Eyes: Pupils are equal, round, and reactive to light. Conjunctivae and EOM are normal.  Neck: Normal range of motion. Neck  supple.  Cardiovascular: Regular rhythm, S1 normal and S2 normal.  Exam reveals no gallop and no friction rub.   No murmur heard. Pulmonary/Chest: Effort normal and breath sounds normal. No respiratory distress. She exhibits no tenderness.  Abdominal: Soft. Normal appearance and bowel sounds are normal. There is no hepatosplenomegaly. There is no tenderness. There is no rebound, no guarding, no tenderness at McBurney's point and negative Murphy's sign. No hernia.  Musculoskeletal: Normal range of motion.  Neurological: She is alert and oriented to person, place, and time. She has normal strength. No cranial nerve deficit or sensory deficit. Coordination normal. GCS eye subscore is 4. GCS verbal subscore is 5. GCS motor subscore is 6.  Skin: Skin is warm, dry and intact. No rash noted. No cyanosis.  Psychiatric: She has a normal mood and affect. Her speech is normal and behavior is normal. Thought content normal.  Nursing note and vitals reviewed.    ED Treatments / Results  Labs (all labs ordered are listed, but only abnormal results are displayed) Labs Reviewed  CBC    EKG  EKG Interpretation None       Radiology No results found.  Procedures Procedures (including critical care time)  Medications Ordered in ED Medications - No data to display   Initial Impression / Assessment and Plan / ED Course  I have reviewed the triage vital signs and the nursing notes.  Pertinent labs & imaging results that were available during my care of the patient were reviewed by me and considered in my medical decision making (see chart for details).     Patient presents to the emergency department for evaluation of bleeding from her lip.  She reports that this was unprovoked.  She does have some slight swelling of the lower lip.  The intraoral mucosa adjacent to the swelling is abnormal, cannot tell if this is simply an abrasion or some type of lesion.  I  did check her CBC, she is not  thrombocytopenic.  There is no active bleeding.  Will refer to ENT for recheck.  Final Clinical Impressions(s) / ED Diagnoses   Final diagnoses:  Abrasion of lip, initial encounter    New Prescriptions New Prescriptions   No medications on file     Orpah Greek, MD 09/19/17 0200

## 2017-09-21 ENCOUNTER — Encounter: Payer: Self-pay | Admitting: Family Medicine

## 2017-09-21 ENCOUNTER — Ambulatory Visit (INDEPENDENT_AMBULATORY_CARE_PROVIDER_SITE_OTHER): Payer: Medicare Other | Admitting: Family Medicine

## 2017-09-21 DIAGNOSIS — R58 Hemorrhage, not elsewhere classified: Secondary | ICD-10-CM | POA: Diagnosis not present

## 2017-09-21 NOTE — Patient Instructions (Signed)
If you have more trouble with bleeding at the lip, then put pressure on it with a cloth or a cotton ball.  It should gradually get better.  If you have other bleeding or black stools then let me know.   Take care.  Glad to see you.

## 2017-09-21 NOTE — Progress Notes (Signed)
She went to the emergency room when she had bleeding from the lower lip, just left of midline. It was not an arterial bleed. Red blood. No trauma. No sores noted. Emergency room evaluation noted. CBC unremarkable. Not anemic. Platelets were normal. Discussed with patient. In the meantime she has had a few episodes of bleeding in the same area.  Each episode was less than the initial episode.  No other bleeding, no nosebleeds, no hematuria, no blood in stool, no black stool. No other inappropriate bruising. She feels well otherwise. No oral lesions.  Meds, vitals, and allergies reviewed.   ROS: Per HPI unless specifically indicated in ROS section   nad ncat OP wnl, no lesions noted except for some very mild irritation on the inner portion of the lower lip on the left side. The left side of the lower lip is very slightly puffy, but I do not feel a fluctuant mass. Not tender. No external skin changes visible. Oral exam is otherwise unremarkable. No ulceration. Neck supple, no lymphadenopathy.

## 2017-09-22 DIAGNOSIS — K136 Irritative hyperplasia of oral mucosa: Secondary | ICD-10-CM | POA: Insufficient documentation

## 2017-09-22 NOTE — Assessment & Plan Note (Signed)
Unclear if she had some unrecalled mild trauma to the lip that could've caused the bleeding.  She doesn't recall any trigger or trauma. She has minimal puffiness of the lip that appears to be mild and local. No sign of systemic illness. No bleeding now. Reasonable to observe for now. If she continues to have symptoms let me know. She agrees. No fluctuant mass. No intervention needed now. She agrees. She has no obvious bleeding disorder that would put her at elevated risk.

## 2017-10-27 ENCOUNTER — Other Ambulatory Visit (INDEPENDENT_AMBULATORY_CARE_PROVIDER_SITE_OTHER): Payer: Medicare HMO

## 2017-10-27 DIAGNOSIS — E119 Type 2 diabetes mellitus without complications: Secondary | ICD-10-CM | POA: Diagnosis not present

## 2017-10-27 LAB — COMPREHENSIVE METABOLIC PANEL
ALBUMIN: 3.9 g/dL (ref 3.5–5.2)
ALT: 23 U/L (ref 0–35)
AST: 35 U/L (ref 0–37)
Alkaline Phosphatase: 57 U/L (ref 39–117)
BUN: 13 mg/dL (ref 6–23)
CALCIUM: 9.5 mg/dL (ref 8.4–10.5)
CHLORIDE: 101 meq/L (ref 96–112)
CO2: 29 mEq/L (ref 19–32)
CREATININE: 0.86 mg/dL (ref 0.40–1.20)
GFR: 82.57 mL/min (ref >60.00)
Glucose, Bld: 133 mg/dL — ABNORMAL HIGH (ref 70–99)
Potassium: 3.7 mEq/L (ref 3.5–5.1)
Sodium: 139 mEq/L (ref 135–145)
Total Bilirubin: 0.4 mg/dL (ref 0.2–1.2)
Total Protein: 7.2 g/dL (ref 6.0–8.3)

## 2017-10-27 LAB — LIPID PANEL
CHOLESTEROL: 241 mg/dL — AB (ref 0–200)
HDL: 55.7 mg/dL (ref >39.00)
NonHDL: 185.27
Total CHOL/HDL Ratio: 4
Triglycerides: 270 mg/dL — ABNORMAL HIGH (ref 0.0–149.0)
VLDL: 54 mg/dL — AB (ref 0.0–40.0)

## 2017-10-27 LAB — LDL CHOLESTEROL, DIRECT: LDL DIRECT: 154 mg/dL

## 2017-10-27 LAB — HEMOGLOBIN A1C: HEMOGLOBIN A1C: 6.6 % — AB (ref 4.6–6.5)

## 2017-10-31 ENCOUNTER — Encounter: Payer: Self-pay | Admitting: Family Medicine

## 2017-10-31 ENCOUNTER — Ambulatory Visit: Payer: Medicare HMO | Admitting: Family Medicine

## 2017-10-31 VITALS — BP 108/66 | HR 88 | Temp 98.4°F | Wt 174.5 lb

## 2017-10-31 DIAGNOSIS — E119 Type 2 diabetes mellitus without complications: Secondary | ICD-10-CM

## 2017-10-31 DIAGNOSIS — J069 Acute upper respiratory infection, unspecified: Secondary | ICD-10-CM

## 2017-10-31 MED ORDER — AZITHROMYCIN 250 MG PO TABS
ORAL_TABLET | ORAL | 0 refills | Status: DC
Start: 2017-10-31 — End: 2018-03-02

## 2017-10-31 MED ORDER — FLUTICASONE PROPIONATE 50 MCG/ACT NA SUSP: 2.0000 | Freq: Every day | NASAL | Status: DC

## 2017-10-31 MED ORDER — FLUCONAZOLE 150 MG PO TABS: 150.0000 mg | ORAL_TABLET | Freq: Once | ORAL | 0 refills | Status: DC

## 2017-10-31 NOTE — Progress Notes (Signed)
Diabetes:  No meds.  Hypoglycemic episodes:no Hyperglycemic episodes:no Feet problems: no Blood Sugars averaging: ~108-126 per patient report.   eye exam within last year: yes, ~May 2018 per patient report.  A1c d/w pt.   Walking limited recently with weather changes.  She is going to get a treadmill in the near future.   No numbness, tingling.  No weakness.   She isn't lightheaded.    Sinus pressure.  No fevers.  No vomiting.  Some cough.  Some post nasal gtt at night.  Some facial pain.  No tooth pain.  Getting worse in the last few weeks.    No more incidental bleeding in the meantime.  D/w pt.    Meds, vitals, and allergies reviewed.   ROS: Per HPI unless specifically indicated in ROS section   GEN: nad, alert and oriented HEENT: mucous membranes moist, TM wnl, max sinuses ttp B NECK: supple w/o LA CV: rrr. PULM: ctab, no inc wob ABD: soft, +bs EXT: no edema SKIN: no acute rash  Diabetic foot exam: Normal inspection No skin breakdown No calluses  Normal DP pulses Normal sensation to light touch and monofilament Nails slightly thickened.

## 2017-10-31 NOTE — Patient Instructions (Signed)
Recheck in about 4 months, labs ahead of time.  Use OTC flonase and if not better then start zithromax.  Use diflucan if needed.  Take care.  Glad to see you.  Update me as needed.

## 2017-11-01 DIAGNOSIS — J069 Acute upper respiratory infection, unspecified: Secondary | ICD-10-CM | POA: Insufficient documentation

## 2017-11-01 NOTE — Assessment & Plan Note (Signed)
Nontoxic, start flonase, use zmax if worse and diflucan if needed.  See AVS.  She agrees.

## 2017-11-01 NOTE — Assessment & Plan Note (Signed)
A1c d/w pt.  Controlled, continue as is.  She agrees.   Walking limited recently with weather changes.  She is going to get a treadmill in the near future.   No numbness, tingling.  No weakness.   She isn't lightheaded.   Recheck periodically.  Statin intolerant.

## 2017-11-25 ENCOUNTER — Other Ambulatory Visit: Payer: Self-pay | Admitting: Family Medicine

## 2017-11-27 NOTE — Telephone Encounter (Signed)
Electronic refill Last office visit 10/31/17/acute Upcoming appointment 03/02/18 See allergy/contraindication

## 2017-11-28 NOTE — Telephone Encounter (Signed)
Sent. Thanks.   

## 2017-12-07 ENCOUNTER — Other Ambulatory Visit: Payer: Self-pay | Admitting: Family Medicine

## 2017-12-07 NOTE — Telephone Encounter (Signed)
Electronic refill request. Amitriptyline Last office visit:   10/31/2017 Last Filled:    180 tablet 3 12/19/2016  Please advise.

## 2017-12-08 NOTE — Telephone Encounter (Signed)
Sent. Thanks.   

## 2017-12-08 NOTE — Telephone Encounter (Signed)
Electronic refill request Last refill 03/27/17 #30/2 Last office visit 10/31/17

## 2017-12-10 NOTE — Telephone Encounter (Signed)
Please call in.  Thanks.   

## 2017-12-11 NOTE — Telephone Encounter (Signed)
Medication phoned to pharmacy.  

## 2017-12-12 ENCOUNTER — Other Ambulatory Visit: Payer: Self-pay

## 2017-12-12 NOTE — Telephone Encounter (Signed)
Fluocinonide cream 0.05% last refilled # 30 g x 3 on 12/02/15. Apply topically daily prn. Pt last seen 10/31/17.Please advise.

## 2017-12-12 NOTE — Telephone Encounter (Signed)
Copied from [place] [id number]. Topic: General - Other >> Dec 12, 2017  3:24 PM Carolyn Stare wrote:  Pt call to say she had been using the below med and now she does not have any more and is asking for a refill. I do not see the med on her list  FLUOCINONIDE CREAM   Pharmacy [facility] [address]

## 2017-12-13 MED ORDER — FLUOCINONIDE 0.05 % EX CREA: TOPICAL_CREAM | CUTANEOUS | 3 refills | Status: DC

## 2017-12-13 NOTE — Telephone Encounter (Signed)
Sent. Thanks.   

## 2018-02-05 ENCOUNTER — Other Ambulatory Visit: Payer: Self-pay | Admitting: Family Medicine

## 2018-02-09 ENCOUNTER — Telehealth: Payer: Self-pay

## 2018-02-09 NOTE — Telephone Encounter (Signed)
I spoke with pt and she thinks sinus problem, S/T, dry cough,no fever today. No available appts at [facility] today; pt scheduled Sat Clinic with Christen Butter FNP 02/10/18 at 10:45. Pt may try nasal saline and warm compresses until seen. FYI to Dr Damita Dunnings and Mable Paris FNP.

## 2018-02-09 NOTE — Telephone Encounter (Signed)
Copied from [place] [id number]. Topic: Quick Communication - See Telephone Encounter >> Feb 09, 2018  1:13 PM Antonieta Iba C wrote: CRM for notification. See Telephone encounter for: pt called in to schedule an appt with provider, not showing an opening today in office. Pt says every year she gets a sinus infection. Runny nose,sore throat pt would like to know if provider would send in a Rx to her pharmacy for her?   [id number] [place], [place]  34/74/25.

## 2018-02-10 ENCOUNTER — Encounter: Payer: Self-pay | Admitting: Family

## 2018-02-10 ENCOUNTER — Ambulatory Visit (INDEPENDENT_AMBULATORY_CARE_PROVIDER_SITE_OTHER): Payer: Medicare Other | Admitting: Family

## 2018-02-10 VITALS — BP 112/76 | HR 67 | Temp 98.2°F | Resp 16 | Ht 63.0 in | Wt 175.0 lb

## 2018-02-10 DIAGNOSIS — J0100 Acute maxillary sinusitis, unspecified: Secondary | ICD-10-CM | POA: Diagnosis not present

## 2018-02-10 MED ORDER — AMOXICILLIN 500 MG PO CAPS: 500.0000 mg | ORAL_CAPSULE | Freq: Two times a day (BID) | ORAL | 0 refills | Status: DC

## 2018-02-10 NOTE — Patient Instructions (Signed)
As discussed please purchase over-the-counter Mucinex.  Please purchase the plain version of Mucinex do not purchase the combination versions with a decongestant as will raise  blood pressure  Please start amoxicillin if over the weekend do not see any improvement with conservative therapy.  If you start the amoxicillin please ensure that she finished the antibiotic and also are taking a probiotic  Please let us know if not better   Sinusitis, Adult Sinusitis is soreness and inflammation of your sinuses. Sinuses are hollow spaces in the bones around your face. They are located:  Around your eyes.  In the middle of your forehead.  Behind your nose.  In your cheekbones.  Your sinuses and nasal passages are lined with a stringy fluid (mucus). Mucus normally drains out of your sinuses. When your nasal tissues get inflamed or swollen, the mucus can get trapped or blocked so air cannot flow through your sinuses. This lets bacteria, viruses, and funguses grow, and that leads to infection. Follow these instructions at home: Medicines  Take, use, or apply over-the-counter and prescription medicines only as told by your doctor. These may include nasal sprays.  If you were prescribed an antibiotic medicine, take it as told by your doctor. Do not stop taking the antibiotic even if you start to feel better. Hydrate and Humidify  Drink enough water to keep your pee (urine) clear or pale yellow.  Use a cool mist humidifier to keep the humidity level in your home above 50%.  Breathe in steam for 10-15 minutes, 3-4 times a day or as told by your doctor. You can do this in the bathroom while a hot shower is running.  Try not to spend time in cool or dry air. Rest  Rest as much as possible.  Sleep with your head raised (elevated).  Make sure to get enough sleep each night. General instructions  Put a warm, moist washcloth on your face 3-4 times a day or as told by your doctor. This will  help with discomfort.  Wash your hands often with soap and water. If there is no soap and water, use hand sanitizer.  Do not smoke. Avoid being around people who are smoking (secondhand smoke).  Keep all follow-up visits as told by your doctor. This is important. Contact a doctor if:  You have a fever.  Your symptoms get worse.  Your symptoms do not get better within 10 days. Get help right away if:  You have a very bad headache.  You cannot stop throwing up (vomiting).  You have pain or swelling around your face or eyes.  You have trouble seeing.  You feel confused.  Your neck is stiff.  You have trouble breathing. This information is not intended to replace advice given to you by your health care provider. Make sure you discuss any questions you have with your health care provider. Document Released: 05/02/2008 Document Revised: 07/10/2016 Document Reviewed: 09/09/2015 Elsevier Interactive Patient Education  [employer].

## 2018-02-10 NOTE — Progress Notes (Signed)
Subjective:    Patient ID: Kelly Kennedy, female    DOB: 06-16-1941, 77 y.o.   MRN: [id number]  CC: Kelly Kennedy is a 77 y.o. female who presents today for an acute visit.    HPI: Chief complaint of sinus pressure, sore throat, sneezing x 3 days, worsening. Notes thick , green nasal congestion.  No fever, sob, wheezing.  More sneezing than coughing. Has been using nasal saline and warm compresses.   No lung disease. Quit smoking 50+ years ago. Never had to use an inhaler.   Has had amoxicillin in the past. No allergy to medication    HISTORY:  Past Medical History:  Diagnosis Date  . adenomatous Colon polyps    colonoscopy 8/99, 12/02, 8/06  . Arthritis   . Bell's palsy   . Cancer [facility])    DCIS left breast  . Chest pain    uses NTG as needed  . Depression   . Diabetes mellitus without complication ([place])   . Diverticulosis of colon 06/2005  . Ductal carcinoma in situ (DCIS) of left breast   . Family history of adverse reaction to anesthesia    " my mother didn't wake up so they had to put her on life support for about an hour or more; my sisiter has problems waking up too from anesthesia.  Marland Kitchen GERD (gastroesophageal reflux disease)   . H/O hiatal hernia   . Headache(784.0)   . Hyperglycemia   . Hyperlipidemia   . Hypertension   . Hypothyroid   . Insomnia   . Irritable bowel   . Menopausal symptoms   . Pericarditis 1980's  . Stroke ([place])   . TIA (transient ischemic attack)    09/2014   Past Surgical History:  Procedure Laterality Date  . APPENDECTOMY    . BREAST RECONSTRUCTION WITH PLACEMENT OF TISSUE EXPANDER AND FLEX HD (ACELLULAR HYDRATED DERMIS) Left 08/06/2015   Procedure: LEFT BREAST RECONSTRUCTION WITH PLACEMENT OF SALINE IMPLANT AND ACELLULAR DURMAL MATRIX;  Surgeon: Crissie Reese, MD;  Location: [place];  Service: Plastics;  Laterality: Left;  . CARDIAC CATHETERIZATION    . CATARACT EXTRACTION Bilateral 2016  . CHOLECYSTECTOMY  1990  . CYSTECTOMY   05/04/08   left lower arm, benign  . ECTOPIC PREGNANCY SURGERY  1971  . FRACTURE SURGERY     nose  . NIPPLE SPARING MASTECTOMY Left 08/06/2015   Procedure: LEFT NIPPLE SPARING MASTECTOMY;  Surgeon: [name], MD;  Location: [place];  Service: General;  Laterality: Left;  . ORIF ANKLE FRACTURE Left 10/23/2013   Procedure: OPEN REDUCTION INTERNAL FIXATION (ORIF) LEFT ANKLE FRACTURE;  Surgeon: Marianna Payment, MD;  Location: [place];  Service: Orthopedics;  Laterality: Left;  . PARTIAL HYSTERECTOMY     ovaries intact  . PARTIAL MASTECTOMY WITH NEEDLE LOCALIZATION AND AXILLARY SENTINEL LYMPH NODE BX Left 05/06/2015   Procedure: LEFT BREAST PARTIAL MASTECTOMY WITH NEEDLE LOCALIZATION AND SENTINEL LYMPH NODE MAPPING;  Surgeon: [name], MD;  Location: [place];  Service: General;  Laterality: Left;  . TONSILLECTOMY    . TUBAL LIGATION     Family History  Problem Relation Age of Onset  . Cancer Father        stomach   . Stomach cancer Father   . Heart disease Son        MI  . Arthritis Mother   . Diabetes Mother   . Cancer Brother        lung cancer  .  Colon cancer Neg Hx   . Pancreatic cancer Neg Hx   . Breast cancer Neg Hx     Allergies: Ace inhibitors; Angiotensin receptor blockers; Statins; Glipizide; Niacin; and Red dye Current Outpatient Medications on File Prior to Visit  Medication Sig Dispense Refill  . amitriptyline (ELAVIL) 50 MG tablet TAKE 1 TO 2 TABLETS BY MOUTH AT BEDTIME AS NEEDED FOR SLEEP 180 tablet 3  . amLODipine (NORVASC) 5 MG tablet TAKE 1 TABLET BY MOUTH DAILY 90 tablet 3  . aspirin EC 81 MG tablet Take 81 mg by mouth daily.    . Aspirin-Salicylamide-Caffeine (BC HEADACHE POWDER PO) Take 1 Package by mouth daily as needed (pain).     Marland Kitchen azithromycin (ZITHROMAX) 250 MG tablet 2 tabs a day for 1 day and then 1 a day for 4 days. 6 each 0  . diclofenac (VOLTAREN) 75 MG EC tablet Take 1 tablet (75 mg total) by mouth 2 (two) times daily. 30 tablet  0  . fluocinonide cream (LIDEX) 0.05 % APPLY TOPICALLY DAILY AS NEEDED. Use sparingly. 30 g 3  . fluticasone (FLONASE) 50 MCG/ACT nasal spray Place 2 sprays into both nostrils daily.    Marland Kitchen levothyroxine (SYNTHROID, LEVOTHROID) 75 MCG tablet TAKE 1 TABLET BY MOUTH EVERY DAY BEFORE BREAKFAST 90 tablet 1  . magnesium oxide (MAG-OX) 400 (241.3 Mg) MG tablet TAKE 1 TABLET BY MOUTH DAILY. 90 tablet 1  . nitroGLYCERIN (NITROSTAT) 0.4 MG SL tablet PLACE 1 TABLET (0.4 MG TOTAL) UNDER THE TONGUE EVERY 5 (FIVE) MINUTES AS NEEDED FOR CHEST PAIN. 25 tablet 3  . omeprazole (PRILOSEC OTC) 20 MG tablet Take 20 mg by mouth daily.    . tamoxifen (NOLVADEX) 20 MG tablet TAKE 1 TABLET (20 MG TOTAL) BY MOUTH DAILY. 90 tablet 1  . traMADol (ULTRAM) 50 MG tablet TAKE 1 TABLET EVERY 8 HOURS AS NEEDED FOR PAIN 30 tablet 2  . VOLTAREN 1 % GEL APPLY 2-4 GRAMS TO AFFECTED AREA TWICE DAILY 100 g 3   No current facility-administered medications on file prior to visit.     Social History   Tobacco Use  . Smoking status: Former Smoker    Last attempt to quit: 04/03/2017    Years since quitting: 0.8  . Smokeless tobacco: Former Systems developer  . Tobacco comment: PT STATES SHE DOES NOT SMOKE.  Substance Use Topics  . Alcohol use: No    Alcohol/week: 0.0 oz    Comment: social  . Drug use: No    Review of Systems  Constitutional: Negative for chills and fever.  HENT: Positive for congestion, sinus pressure and sore throat.   Respiratory: Negative for cough, shortness of breath and wheezing.   Cardiovascular: Negative for chest pain and palpitations.  Gastrointestinal: Negative for nausea and vomiting.      Objective:    BP 112/76 (BP Location: Left Arm, Patient Position: Sitting, Cuff Size: Normal)   Pulse 67   Temp 98.2 F (36.8 C) (Oral)   Resp 16   Ht 5\' 3"  (1.6 m)   Wt 175 lb (79.4 kg)   SpO2 95%   BMI 31.00 kg/m    Physical Exam  Constitutional: She appears well-developed and well-nourished.  HENT:  Head:  Normocephalic and atraumatic.  Right Ear: Hearing, tympanic membrane, external ear and ear canal normal. No drainage, swelling or tenderness. No foreign bodies. Tympanic membrane is not erythematous and not bulging. No middle ear effusion. No decreased hearing is noted.  Left Ear: Hearing, tympanic membrane, external  ear and ear canal normal. No drainage, swelling or tenderness. No foreign bodies. Tympanic membrane is not erythematous and not bulging.  No middle ear effusion. No decreased hearing is noted.  Nose: Rhinorrhea present. Right sinus exhibits no maxillary sinus tenderness and no frontal sinus tenderness. Left sinus exhibits no maxillary sinus tenderness and no frontal sinus tenderness.  Mouth/Throat: Uvula is midline, oropharynx is clear and moist and mucous membranes are normal. No oropharyngeal exudate, posterior oropharyngeal edema, posterior oropharyngeal erythema or tonsillar abscesses.  Eyes: Conjunctivae are normal.  Cardiovascular: Regular rhythm, normal heart sounds and normal pulses.  Pulmonary/Chest: Effort normal and breath sounds normal. She has no wheezes. She has no rhonchi. She has no rales.  Lymphadenopathy:       Head (right side): No submental, no submandibular, no tonsillar, no preauricular, no posterior auricular and no occipital adenopathy present.       Head (left side): No submental, no submandibular, no tonsillar, no preauricular, no posterior auricular and no occipital adenopathy present.    She has no cervical adenopathy.  Neurological: She is alert.  Skin: Skin is warm and dry.  Psychiatric: She has a normal mood and affect. Her speech is normal and behavior is normal. Thought content normal.  Vitals reviewed.      Assessment & Plan:   1. Acute non-recurrent maxillary sinusitis Patient is afebrile and well-appearing today.  Duration of symptoms has been 3 days patient has not tried yet conservative therapy at home.  We discussed trying expectorant, Mucinex  first see if we can break up congestion.  She will try the plenty of water over the weekend, if no better, patient understands to fill amoxicillin.  Return precautions given. - amoxicillin (AMOXIL) 500 MG capsule; Take 1 capsule (500 mg total) by mouth 2 (two) times daily.  Dispense: 14 capsule; Refill: 0    I am having Xandria L. Goldberg start on amoxicillin. I am also having her maintain her omeprazole, Aspirin-Salicylamide-Caffeine (BC HEADACHE POWDER PO), aspirin EC, diclofenac, VOLTAREN, nitroGLYCERIN, amLODipine, tamoxifen, azithromycin, fluticasone, levothyroxine, amitriptyline, traMADol, fluocinonide cream, and magnesium oxide.   Meds ordered this encounter  Medications  . amoxicillin (AMOXIL) 500 MG capsule    Sig: Take 1 capsule (500 mg total) by mouth 2 (two) times daily.    Dispense:  14 capsule    Refill:  0    Order Specific Question:   Supervising Provider    Answer:   Crecencio Mc [[id number]]    Return precautions given.   Risks, benefits, and alternatives of the medications and treatment plan prescribed today were discussed, and patient expressed understanding.   Education regarding symptom management and diagnosis given to patient on AVS.  Continue to follow with [name], MD for routine health maintenance.   Odetta L Mod[place] and I agreed with plan.   Mable Paris, FNP

## 2018-02-10 NOTE — Progress Notes (Signed)
Pre visit review using our clinic review tool, if applicable. No additional management support is needed unless otherwise documented below in the visit note. 

## 2018-02-11 NOTE — Telephone Encounter (Signed)
Agreed.  Thanks.  

## 2018-02-20 ENCOUNTER — Other Ambulatory Visit: Payer: Self-pay | Admitting: Surgery

## 2018-02-20 DIAGNOSIS — Z1231 Encounter for screening mammogram for malignant neoplasm of breast: Secondary | ICD-10-CM

## 2018-02-26 ENCOUNTER — Other Ambulatory Visit: Payer: Self-pay | Admitting: Family Medicine

## 2018-02-26 DIAGNOSIS — E1159 Type 2 diabetes mellitus with other circulatory complications: Secondary | ICD-10-CM

## 2018-02-28 ENCOUNTER — Ambulatory Visit
Admission: RE | Admit: 2018-02-28 | Discharge: 2018-02-28 | Disposition: A | Discharge status: Home / Self Care | Payer: Medicare Other | Source: Ambulatory Visit | Attending: Internal Medicine | Admitting: Internal Medicine

## 2018-02-28 ENCOUNTER — Other Ambulatory Visit: Payer: Medicare Other

## 2018-02-28 ENCOUNTER — Ambulatory Visit
Admission: RE | Admit: 2018-02-28 | Discharge: 2018-02-28 | Disposition: A | Discharge status: Home / Self Care | Payer: Medicare Other | Source: Ambulatory Visit | Attending: Surgery | Admitting: Surgery

## 2018-02-28 ENCOUNTER — Other Ambulatory Visit (INDEPENDENT_AMBULATORY_CARE_PROVIDER_SITE_OTHER): Payer: Medicare Other

## 2018-02-28 ENCOUNTER — Other Ambulatory Visit (HOSPITAL_COMMUNITY): Payer: Self-pay | Admitting: Internal Medicine

## 2018-02-28 DIAGNOSIS — Z853 Personal history of malignant neoplasm of breast: Secondary | ICD-10-CM

## 2018-02-28 DIAGNOSIS — Z1231 Encounter for screening mammogram for malignant neoplasm of breast: Secondary | ICD-10-CM | POA: Diagnosis not present

## 2018-02-28 DIAGNOSIS — E1159 Type 2 diabetes mellitus with other circulatory complications: Secondary | ICD-10-CM | POA: Diagnosis not present

## 2018-02-28 LAB — HEMOGLOBIN A1C: HEMOGLOBIN A1C: 6.9 % — AB (ref 4.6–6.5)

## 2018-03-02 ENCOUNTER — Ambulatory Visit (INDEPENDENT_AMBULATORY_CARE_PROVIDER_SITE_OTHER): Payer: Medicare Other | Admitting: Family Medicine

## 2018-03-02 ENCOUNTER — Encounter: Payer: Self-pay | Admitting: Family Medicine

## 2018-03-02 DIAGNOSIS — E119 Type 2 diabetes mellitus without complications: Secondary | ICD-10-CM | POA: Diagnosis not present

## 2018-03-02 NOTE — Progress Notes (Signed)
She got better from prev illness.  Still using flonase.  Using nasal saline.    BP has been similar to today on home checks.  She isn't lightheaded.  She had one episode of vertigo when she got up to go to [place] in the middle of the night.  Single event, no sx now.  D/w pt.   Diabetes:  No meds.   Hypoglycemic episodes: no Hyperglycemic episodes: no Feet problems: no Blood Sugars averaging: usually 150 or lower.   eye exam within last year: d/w pt.  Due.  Her copay went up.   A1c at goal, given her age.   D/w pt about diet and exercise.    Meds, vitals, and allergies reviewed.  ROS: Per HPI unless specifically indicated in ROS section   GEN: nad, alert and oriented HEENT: mucous membranes moist NECK: supple w/o LA CV: rrr. PULM: ctab, no inc wob ABD: soft, +bs EXT: no edema

## 2018-03-02 NOTE — Patient Instructions (Addendum)
Call about an eye exam when possible.  I'll check on options in the meantime.  If you have more trouble with getting dizzy, then let me know.  Recheck in about 4 months, labs ahead of time if possible.  Take care.  Glad to see you.

## 2018-03-03 NOTE — Assessment & Plan Note (Addendum)
eye exam within last year: d/w pt.  Due.  Her copay went up.  I'll check with staff about options to try to help her out with this.   A1c at goal, given her age.   D/w pt about diet and exercise.   No change in meds.  Recheck episodically.  She agrees. I realize that under typical situations her goal A1c would be lower but given her age and the risk of hypoglycemia I do not want to put her on extra medication at this point.  She completely agrees with that assessment and plan.

## 2018-03-08 ENCOUNTER — Ambulatory Visit (INDEPENDENT_AMBULATORY_CARE_PROVIDER_SITE_OTHER): Payer: Medicare Other | Admitting: Family Medicine

## 2018-03-08 ENCOUNTER — Encounter: Payer: Self-pay | Admitting: Family Medicine

## 2018-03-08 ENCOUNTER — Other Ambulatory Visit: Payer: Self-pay | Admitting: Family Medicine

## 2018-03-08 DIAGNOSIS — K136 Irritative hyperplasia of oral mucosa: Secondary | ICD-10-CM | POA: Diagnosis not present

## 2018-03-08 NOTE — Patient Instructions (Signed)
It looks like you have irritation from your upper plate. Try anbesol as needed for the pain and see about getting an appointment at the dental clinic.  I don't see any sign of infection in the meantime.  Take care.  Glad to see you.

## 2018-03-08 NOTE — Telephone Encounter (Signed)
Electronic refill request. Amitriptyline Last office visit:   03/08/18 Last Filled:    180 tablet 3 12/08/2017  Please advise.

## 2018-03-08 NOTE — Progress Notes (Signed)
Mouth pain/gum pain.  Better today.  Top and bottom gum line pain.  No pus, no ulcers.  She has partial upper.  No fevers, no chills.  No tooth pain.  No tongue pain.    Meds, vitals, and allergies reviewed.   ROS: Per HPI unless specifically indicated in ROS section   nad ncat Nasal and OP exam wnl except for upper and lower B gum irritation.   No other irritation or lesions noted, neck supple, no LA

## 2018-03-09 NOTE — Telephone Encounter (Signed)
This was sent in 11/2017.  Why does she need another refill? Please let me know.  Thanks.

## 2018-03-10 NOTE — Assessment & Plan Note (Signed)
I question if this is related to upper denture fit, or changes thereof.  She can try anbesol topically and see if that helps.  I encouraged her to follow-up with her dentist.  She understood.

## 2018-03-12 ENCOUNTER — Other Ambulatory Visit: Payer: Self-pay | Admitting: Oncology

## 2018-04-30 ENCOUNTER — Telehealth: Payer: Self-pay | Admitting: Oncology

## 2018-04-30 NOTE — Telephone Encounter (Signed)
Called pt.  Not able to leave message @ either #.  Resch appt from 7/10 to 8/6 at 3 pm. Mailed calendar.

## 2018-05-14 ENCOUNTER — Other Ambulatory Visit: Payer: Self-pay | Admitting: *Deleted

## 2018-05-29 ENCOUNTER — Other Ambulatory Visit: Payer: Self-pay | Admitting: Family Medicine

## 2018-05-29 ENCOUNTER — Other Ambulatory Visit (INDEPENDENT_AMBULATORY_CARE_PROVIDER_SITE_OTHER): Payer: Self-pay | Admitting: Orthopaedic Surgery

## 2018-05-29 ENCOUNTER — Other Ambulatory Visit: Payer: Self-pay | Admitting: Family

## 2018-05-29 DIAGNOSIS — R200 Anesthesia of skin: Secondary | ICD-10-CM

## 2018-05-29 DIAGNOSIS — E039 Hypothyroidism, unspecified: Secondary | ICD-10-CM

## 2018-05-29 DIAGNOSIS — J0100 Acute maxillary sinusitis, unspecified: Secondary | ICD-10-CM

## 2018-05-29 NOTE — Telephone Encounter (Signed)
Electronic refill request Last refill 11/28/17 #90/1 Last office visit 03/08/18 Last TSH 03/20/17 See allergy/contraindication

## 2018-05-30 NOTE — Telephone Encounter (Signed)
Sent. Thanks.   

## 2018-05-30 NOTE — Telephone Encounter (Signed)
yes

## 2018-06-06 ENCOUNTER — Ambulatory Visit: Payer: Self-pay | Admitting: Oncology

## 2018-07-02 ENCOUNTER — Other Ambulatory Visit (INDEPENDENT_AMBULATORY_CARE_PROVIDER_SITE_OTHER): Payer: Medicare Other

## 2018-07-02 DIAGNOSIS — E039 Hypothyroidism, unspecified: Secondary | ICD-10-CM

## 2018-07-02 LAB — TSH: TSH: 1.1 u[IU]/mL (ref 0.35–4.50)

## 2018-07-02 NOTE — Progress Notes (Signed)
Kelly Kennedy:([phone]) [phone] [phone]) [phone]     Kelly Kennedy: Kelly Kennedy DOB: 09/26/1941  MR#: [id number]  QMV#:[id number]  Patient Care Team: [name], MD as PCP - General Jake Shark, Johny Blamer, NP as Nurse Practitioner (Hematology and Oncology) Rydell Wiegel, Virgie Dad, MD as Consulting Physician (Oncology) [name], MD as Consulting Physician (General Surgery) Crissie Reese, MD as Consulting Physician (Plastic Surgery) PCP: [name], MD GYN: SU: [name] MD OTHER MD:  CHIEF COMPLAINT: Ductal carcinoma in situ  CURRENT TREATMENT: tamoxifen   BREAST CANCER HISTORY: From the original intake note:  "Kelly Kennedy" had routine screening mammography with tomography at the breast Center for 20 08/18/2015 showing possible new calcifications in the left breast. Left diagnostic mammography on 04/02/2015 showed pleomorphic calcifications measuring up to 8 mm in the upper outer quadrant. Biopsy of this area on 04/07/2015 showed ([id number]) ductal carcinoma in situ, low to intermediate grade, estrogen receptor 97% positive, progesterone receptor 97% positive, both with strong staining intensity.  A second group of calcifications, more anterior, also in the upper outer quadrant, was biopsied 04/22/2015 showing ([id number]) ductal carcinoma in situ, grade 2, estrogen receptor 100% positive, progesterone receptor 98% positive, both with strong staining intensity.  On 05/06/2015 the patient underwent left partial mastectomy which showed intermediate grade ductal carcinoma in situ extending over 7.9 cm. Both sentinel lymph nodes were clear. Margins were less than a millimeter anteriorly and posteriorly as well as laterally.  Given the many exceedingly close margins and the cosmetic defect already present, the patient has opted for left mastectomy with immediate reconstruction.   Her subsequent history is as detailed below.  INTERVAL  HISTORY: Kelly Kennedy returns today for follow-up of her noninvasive breast cancer . She continues on tamoxifen, with good tolerance.  She does have some hot flashes but she "deals with them".  Vaginal wetness is not a major issue.  Since her last visit, she underwent routine screening unilateral right breast mammography with CAD and tomography on 02/28/2018 at [place] showing: breast density category B. There was no evidence of malignancy.   REVIEW OF SYSTEMS: Kelly Kennedy walks about an hour day most days, unless the weather is really bad.  She also takes care of one grandson particularly but also twins sometimes.  She denies unusual headaches, visual changes, nausea, vomiting, cough, phlegm production, pleurisy, shortness of breath, or change in bowel or bladder habits.  A detailed review of systems was otherwise stable.    PAST MEDICAL HISTORY: Past Medical History:  Diagnosis Date  . adenomatous Colon polyps    colonoscopy 8/99, 12/02, 8/06  . Arthritis   . Bell's palsy   . Cancer [facility])    DCIS left breast  . Chest pain    uses NTG as needed  . Depression   . Diabetes mellitus without complication ([place])   . Diverticulosis of colon 06/2005  . Ductal carcinoma in situ (DCIS) of left breast   . Family history of adverse reaction to anesthesia    " my mother didn't wake up so they had to put her on life support for about an hour or more; my sisiter has problems waking up too from anesthesia.  Marland Kitchen GERD (gastroesophageal reflux disease)   . H/O hiatal hernia   . Headache(784.0)   . Hyperglycemia   . Hyperlipidemia   . Hypertension   . Hypothyroid   . Insomnia   . Irritable bowel   . Menopausal symptoms   . Pericarditis  1980's  . Stroke ([place])   . TIA (transient ischemic attack)    09/2014    PAST SURGICAL HISTORY: Past Surgical History:  Procedure Laterality Date  . APPENDECTOMY    . BREAST EXCISIONAL BIOPSY Right    benign  . BREAST RECONSTRUCTION WITH PLACEMENT OF  TISSUE EXPANDER AND FLEX HD (ACELLULAR HYDRATED DERMIS) Left 08/06/2015   Procedure: LEFT BREAST RECONSTRUCTION WITH PLACEMENT OF SALINE IMPLANT AND ACELLULAR DURMAL MATRIX;  Surgeon: Crissie Reese, MD;  Location: [place];  Service: Plastics;  Laterality: Left;  . CARDIAC CATHETERIZATION    . CATARACT EXTRACTION Bilateral 2016  . CHOLECYSTECTOMY  1990  . CYSTECTOMY  05/04/08   left lower arm, benign  . ECTOPIC PREGNANCY SURGERY  1971  . FRACTURE SURGERY     nose  . MASTECTOMY Left   . NIPPLE SPARING MASTECTOMY Left 08/06/2015   Procedure: LEFT NIPPLE SPARING MASTECTOMY;  Surgeon: [name], MD;  Location: [place];  Service: General;  Laterality: Left;  . ORIF ANKLE FRACTURE Left 10/23/2013   Procedure: OPEN REDUCTION INTERNAL FIXATION (ORIF) LEFT ANKLE FRACTURE;  Surgeon: Marianna Payment, MD;  Location: [place];  Service: Orthopedics;  Laterality: Left;  . PARTIAL HYSTERECTOMY     ovaries intact  . PARTIAL MASTECTOMY WITH NEEDLE LOCALIZATION AND AXILLARY SENTINEL LYMPH NODE BX Left 05/06/2015   Procedure: LEFT BREAST PARTIAL MASTECTOMY WITH NEEDLE LOCALIZATION AND SENTINEL LYMPH NODE MAPPING;  Surgeon: [name], MD;  Location: [place];  Service: General;  Laterality: Left;  . TONSILLECTOMY    . TUBAL LIGATION      FAMILY HISTORY Family History  Problem Relation Age of Onset  . Cancer Father        stomach   . Stomach cancer Father   . Heart disease Son        MI  . Arthritis Mother   . Diabetes Mother   . Cancer Brother        lung cancer  . Colon cancer Neg Hx   . Pancreatic cancer Neg Hx   . Breast cancer Neg Hx    the patient's father died from stomach cancer at age 3. The patient's mother died from gangrene at the age of 35. The patient has one sister, 9 brothers. Her sister had breast cancer diagnosed after the age of 47. There is no history of ovarian cancer in the family.  GYNECOLOGIC HISTORY:  No LMP recorded. Patient has had a  hysterectomy. Menarche age 71, first live birth age 46, the patient is GX P3. She underwent hysterectomy more than 20 years ago. She does believe her ovaries were removed at that time. She was on hormone replacement until earlier this year.  SOCIAL HISTORY:  She used to work in a [employer] but is now retired. She is widowed--her husband died Christmas Day some years ago. At home she lives with her sister Kelly Kennedy. One of the patient's sons died from a massive heart attack while playing basketball at age 36.. The patient's daughter [name] is currently unemployed. The patient has 4 grandchildren and at least 72 great-grandchildren. She attends a [employer] locally    ADVANCED DIRECTIVES: Not in place   HEALTH MAINTENANCE: Social History   Tobacco Use  . Smoking status: Former Smoker    Last attempt to quit: 04/03/2017    Years since quitting: 1.2  . Smokeless tobacco: Former Systems developer  . Tobacco comment: PT STATES SHE DOES NOT SMOKE.  Substance Use Topics  .  Alcohol use: No    Alcohol/week: 0.0 oz    Comment: social  . Drug use: No     Colonoscopy: 09/09/2010, 09/26/2013  PAP:  Bone density: 2013?/Normal per patient report  Lipid panel:  Allergies  Allergen Reactions  . Ace Inhibitors Swelling    H/o lip swelling with change in losartan pill  . Angiotensin Receptor Blockers Swelling    H/o lip swelling with change in losartan pill  . Statins Anaphylaxis    myalgias myalgias  . Flonase [Fluticasone Propionate] Other (See Comments)    Intolerant of smell  . Glipizide Other (See Comments)    unstready when on med.    . Niacin Rash  . Red Dye Rash    Current Outpatient Medications  Medication Sig Dispense Refill  . amitriptyline (ELAVIL) 50 MG tablet TAKE 1 TO 2 TABLETS BY MOUTH AT BEDTIME AS NEEDED FOR SLEEP 180 tablet 3  . amLODipine (NORVASC) 5 MG tablet TAKE 1 TABLET BY MOUTH DAILY 90 tablet 3  . aspirin EC 81 MG tablet Take 81 mg by mouth daily.    .  Aspirin-Salicylamide-Caffeine (BC HEADACHE POWDER PO) Take 1 Package by mouth daily as needed (pain).     Marland Kitchen diclofenac (VOLTAREN) 75 MG EC tablet Take 1 tablet (75 mg total) by mouth 2 (two) times daily. 30 tablet 0  . fluocinonide cream (LIDEX) 0.05 % APPLY TOPICALLY DAILY AS NEEDED. Use sparingly. 30 g 3  . levothyroxine (SYNTHROID, LEVOTHROID) 75 MCG tablet TAKE 1 TABLET BY MOUTH EVERY DAY BEFORE BREAKFAST 90 tablet 1  . magnesium oxide (MAG-OX) 400 (241.3 Mg) MG tablet TAKE 1 TABLET BY MOUTH DAILY. 90 tablet 1  . nitroGLYCERIN (NITROSTAT) 0.4 MG SL tablet PLACE 1 TABLET (0.4 MG TOTAL) UNDER THE TONGUE EVERY 5 (FIVE) MINUTES AS NEEDED FOR CHEST PAIN. 25 tablet 3  . omeprazole (PRILOSEC OTC) 20 MG tablet Take 20 mg by mouth daily.    . tamoxifen (NOLVADEX) 20 MG tablet TAKE 1 TABLET (20 MG TOTAL) BY MOUTH DAILY. 90 tablet 1  . traMADol (ULTRAM) 50 MG tablet TAKE 1 TABLET EVERY 8 HOURS AS NEEDED FOR PAIN 30 tablet 2  . VOLTAREN 1 % GEL APPLY 2-4 GRAMS TO AFFECTED AREA TWICE DAILY 100 g 3   No current facility-administered medications for this visit.     OBJECTIVE: Middle-aged African-American woman no acute distress  Vitals:   07/03/18 1430  BP: 131/72  Pulse: 98  Resp: 18  Temp: 98.5 F (36.9 C)  SpO2: 97%     Body mass index is 31.02 kg/m.    ECOG FS:0 - Asymptomatic  Sclerae unicteric, EOMs intact Oropharynx clear and moist No cervical or supraclavicular adenopathy Lungs no rales or rhonchi Heart regular rate and rhythm Abd soft, nontender, positive bowel sounds MSK no focal spinal tenderness, no upper extremity lymphedema Neuro: nonfocal, well oriented, appropriate affect Breasts: There is post right mastectomy with implant reconstruction.  There is no evidence of chest wall recurrence.  The left breast is benign, status post reduction mammoplasty.  Both axillae are benign.  LAB RESULTS:  CMP     Component Value Date/Time   NA 139 10/27/2017 0756   NA 141 06/10/2015  1549   K 3.7 10/27/2017 0756   K 3.8 06/10/2015 1549   CL 101 10/27/2017 0756   CO2 29 10/27/2017 0756   CO2 26 06/10/2015 1549   GLUCOSE 133 (H) 10/27/2017 0756   GLUCOSE 111 06/10/2015 1549   GLUCOSE 113 (H)  12/11/2006 1253   BUN 13 10/27/2017 0756   BUN 16.6 06/10/2015 1549   CREATININE 0.86 10/27/2017 0756   CREATININE 1.3 (H) 06/10/2015 1549   CALCIUM 9.5 10/27/2017 0756   CALCIUM 9.3 06/10/2015 1549   PROT 7.2 10/27/2017 0756   PROT 7.0 06/10/2015 1549   ALBUMIN 3.9 10/27/2017 0756   ALBUMIN 3.7 06/10/2015 1549   AST 35 10/27/2017 0756   AST 30 06/10/2015 1549   ALT 23 10/27/2017 0756   ALT 31 06/10/2015 1549   ALKPHOS 57 10/27/2017 0756   ALKPHOS 66 06/10/2015 1549   BILITOT 0.4 10/27/2017 0756   BILITOT 0.30 06/10/2015 1549   GFRNONAA 47 (L) 11/10/2015 0323   GFRAA 54 (L) 11/10/2015 0323    INo results found for: SPEP, UPEP  Lab Results  Component Value Date   WBC 5.6 09/19/2017   NEUTROABS 2.6 12/08/2015   HGB 12.5 09/19/2017   HCT 36.7 09/19/2017   MCV 85.9 09/19/2017   PLT 357 09/19/2017      Chemistry      Component Value Date/Time   NA 139 10/27/2017 0756   NA 141 06/10/2015 1549   K 3.7 10/27/2017 0756   K 3.8 06/10/2015 1549   CL 101 10/27/2017 0756   CO2 29 10/27/2017 0756   CO2 26 06/10/2015 1549   BUN 13 10/27/2017 0756   BUN 16.6 06/10/2015 1549   CREATININE 0.86 10/27/2017 0756   CREATININE 1.3 (H) 06/10/2015 1549      Component Value Date/Time   CALCIUM 9.5 10/27/2017 0756   CALCIUM 9.3 06/10/2015 1549   ALKPHOS 57 10/27/2017 0756   ALKPHOS 66 06/10/2015 1549   AST 35 10/27/2017 0756   AST 30 06/10/2015 1549   ALT 23 10/27/2017 0756   ALT 31 06/10/2015 1549   BILITOT 0.4 10/27/2017 0756   BILITOT 0.30 06/10/2015 1549       No results found for: LABCA2  No components found for: LABCA125  No results for input(s): INR in the last 168 hours.  Urinalysis    Component Value Date/Time   COLORURINE YELLOW 11/10/2015 0332    APPEARANCEUR TURBID (A) 11/10/2015 0332   LABSPEC 1.019 11/10/2015 0332   PHURINE 5.5 11/10/2015 0332   GLUCOSEU NEGATIVE 11/10/2015 0332   HGBUR NEGATIVE 11/10/2015 0332   HGBUR negative 07/16/2010 1521   BILIRUBINUR neg 03/10/2016 1447   KETONESUR 15 (A) 11/10/2015 0332   PROTEINUR neg 03/10/2016 1447   PROTEINUR NEGATIVE 11/10/2015 0332   UROBILINOGEN negative 03/10/2016 1447   UROBILINOGEN 0.2 09/29/2014 0645   NITRITE neg 03/10/2016 1447   NITRITE NEGATIVE 11/10/2015 0332   LEUKOCYTESUR Negative 03/10/2016 1447    STUDIES: Since her last visit, she underwent routine screening unilateral right breast mammography with CAD and tomography on 02/28/2018 at [place] showing: breast density category B. There was no evidence of malignancy.   ASSESSMENT: 77 y.o. Black woman status post left lumpectomy and sentinel lymph node sampling 05/06/2015 for extensive ductal carcinoma in situ, grade 2, estrogen and progesterone receptor strongly positive, with both sentinel lymph nodes negative, but exceedingly close margins  (1) left nipple sparing mastectomy with immediate implant reconstruction on 08/06/2015  (2) tamoxifen for breast cancer prevention started 09/29/2015  PLAN: Kelly Kennedy now just over 3 years out from definitive surgery for her breast cancer with no evidence of disease recurrence.  This is very favorable.  She is tolerating tamoxifen well and the plan will be to continue that a total of 5 years.  She has a spot in the left lateral breast which is benign but bothersome to her.  I offered to send her back to Dr. Brantley Stage to have it removed but she wishes to "wait a little on that".  She knows to call for any other issues that may develop before the next visit.  Waverly Tarquinio, Virgie Dad, MD  07/03/18 2:57 PM Medical Oncology and Hematology [facility] [address] [place], [place] [address] Tel. [phone]    Fax. [phone]  Alice Rieger, am acting as scribe for Chauncey Cruel MD.  I, Lurline Del MD, have reviewed the above documentation for accuracy and completeness, and I agree with the above.

## 2018-07-03 ENCOUNTER — Inpatient Hospital Stay: Payer: Medicare Other | Attending: Oncology | Admitting: Oncology

## 2018-07-03 VITALS — BP 131/72 | HR 98 | Temp 98.5°F | Resp 18 | Wt 175.1 lb

## 2018-07-03 DIAGNOSIS — Z170 Estrogen receptor positive status [ER+]: Secondary | ICD-10-CM | POA: Diagnosis not present

## 2018-07-03 DIAGNOSIS — C50412 Malignant neoplasm of upper-outer quadrant of left female breast: Secondary | ICD-10-CM | POA: Diagnosis not present

## 2018-07-03 DIAGNOSIS — Z7982 Long term (current) use of aspirin: Secondary | ICD-10-CM | POA: Insufficient documentation

## 2018-07-03 DIAGNOSIS — E119 Type 2 diabetes mellitus without complications: Secondary | ICD-10-CM | POA: Diagnosis not present

## 2018-07-03 DIAGNOSIS — Z8673 Personal history of transient ischemic attack (TIA), and cerebral infarction without residual deficits: Secondary | ICD-10-CM | POA: Insufficient documentation

## 2018-07-03 DIAGNOSIS — E039 Hypothyroidism, unspecified: Secondary | ICD-10-CM | POA: Diagnosis not present

## 2018-07-03 DIAGNOSIS — Z87891 Personal history of nicotine dependence: Secondary | ICD-10-CM | POA: Diagnosis not present

## 2018-07-03 DIAGNOSIS — D0511 Intraductal carcinoma in situ of right breast: Secondary | ICD-10-CM | POA: Diagnosis not present

## 2018-07-03 DIAGNOSIS — Z79810 Long term (current) use of selective estrogen receptor modulators (SERMs): Secondary | ICD-10-CM | POA: Diagnosis not present

## 2018-07-03 DIAGNOSIS — I10 Essential (primary) hypertension: Secondary | ICD-10-CM

## 2018-07-03 DIAGNOSIS — Z79899 Other long term (current) drug therapy: Secondary | ICD-10-CM | POA: Insufficient documentation

## 2018-07-03 DIAGNOSIS — F329 Major depressive disorder, single episode, unspecified: Secondary | ICD-10-CM | POA: Insufficient documentation

## 2018-07-06 ENCOUNTER — Encounter: Payer: Self-pay | Admitting: Family Medicine

## 2018-07-06 ENCOUNTER — Ambulatory Visit (INDEPENDENT_AMBULATORY_CARE_PROVIDER_SITE_OTHER): Payer: Medicare Other | Admitting: Family Medicine

## 2018-07-06 VITALS — BP 138/70 | HR 80 | Temp 98.5°F | Ht 63.0 in | Wt 175.2 lb

## 2018-07-06 DIAGNOSIS — R0789 Other chest pain: Secondary | ICD-10-CM

## 2018-07-06 DIAGNOSIS — E119 Type 2 diabetes mellitus without complications: Secondary | ICD-10-CM

## 2018-07-06 DIAGNOSIS — Z8639 Personal history of other endocrine, nutritional and metabolic disease: Secondary | ICD-10-CM

## 2018-07-06 DIAGNOSIS — E039 Hypothyroidism, unspecified: Secondary | ICD-10-CM | POA: Diagnosis not present

## 2018-07-06 LAB — CBC WITH DIFFERENTIAL/PLATELET
BASOS ABS: 0.1 10*3/uL (ref 0.0–0.1)
Basophils Relative: 1 % (ref 0.0–3.0)
EOS ABS: 0 10*3/uL (ref 0.0–0.7)
Eosinophils Relative: 0.3 % (ref 0.0–5.0)
HCT: 37.4 % (ref 36.0–46.0)
Hemoglobin: 12.7 g/dL (ref 12.0–15.0)
LYMPHS ABS: 2.2 10*3/uL (ref 0.7–4.0)
LYMPHS PCT: 41.3 % (ref 12.0–46.0)
MCHC: 33.8 g/dL (ref 30.0–36.0)
MCV: 87.4 fl (ref 78.0–100.0)
MONO ABS: 0.6 10*3/uL (ref 0.1–1.0)
Monocytes Relative: 10.4 % (ref 3.0–12.0)
NEUTROS ABS: 2.5 10*3/uL (ref 1.4–7.7)
NEUTROS PCT: 47 % (ref 43.0–77.0)
PLATELETS: 306 10*3/uL (ref 150.0–400.0)
RBC: 4.28 Mil/uL (ref 3.87–5.11)
RDW: 13.9 % (ref 11.5–15.5)
WBC: 5.4 10*3/uL (ref 4.0–10.5)

## 2018-07-06 LAB — POCT GLYCOSYLATED HEMOGLOBIN (HGB A1C): Hemoglobin A1C: 6.1 % — AB (ref 4.0–5.6)

## 2018-07-06 LAB — COMPREHENSIVE METABOLIC PANEL
ALT: 23 U/L (ref 0–35)
AST: 42 U/L — AB (ref 0–37)
Albumin: 3.9 g/dL (ref 3.5–5.2)
Alkaline Phosphatase: 52 U/L (ref 39–117)
BILIRUBIN TOTAL: 0.5 mg/dL (ref 0.2–1.2)
BUN: 11 mg/dL (ref 6–23)
CO2: 31 meq/L (ref 19–32)
CREATININE: 0.84 mg/dL (ref 0.40–1.20)
Calcium: 9.6 mg/dL (ref 8.4–10.5)
Chloride: 104 mEq/L (ref 96–112)
GFR: 84.69 mL/min (ref >60.00)
GLUCOSE: 117 mg/dL — AB (ref 70–99)
Potassium: 4 mEq/L (ref 3.5–5.1)
Sodium: 143 mEq/L (ref 135–145)
Total Protein: 7.1 g/dL (ref 6.0–8.3)

## 2018-07-06 LAB — TROPONIN I: TNIDX: 0.01 ug/L (ref 0.00–0.06)

## 2018-07-06 NOTE — Patient Instructions (Addendum)
Go to the lab on the way out.  We'll contact you with your lab report. We will call about your referral.  Rosaria Ferries or Azalee Course will call you if you don't see one of them on the way out.  If you have more pain, use nitroglycerin if needed.  If not better, then go to the ER.  Take care.  Glad to see you. Ask the front about scheduling a yearly wellness visit when possible.

## 2018-07-06 NOTE — Progress Notes (Signed)
Hypothyroidism.  TSH wnl.  No neck mass.   Compliant.  D/w pt.    Diabetes:  No meds Hypoglycemic episodes: no sx  Hyperglycemic episodes: no sx Feet problems: no Blood Sugars averaging: not checked.   A1c 6.1.  She has been working on diet and exercise as tolerated.   Not diabetic based on A1c.  Had atypical chest pain at rest.  Was at church and thought she needed to belch.  She felt occ sensation of extra beats at the time.  No LOC.  She used Copywriter, advertising x2 and some baking soda w/o relief.  She used NTG x2 but didn't immediately help.  Eventually the pain went away.  She had some throat clearing.  No exertional sx.  Occ SOB with exertion at baseline, not worse recently.    PMH and SH reviewed Meds, vitals, and allergies reviewed.   ROS: Per HPI unless specifically indicated in ROS section   GEN: nad, alert and oriented HEENT: mucous membranes moist NECK: supple w/o LA CV: rrr. PULM: ctab, no inc wob ABD: soft, +bs EXT: no edema SKIN:well perfused.   EKG d/w pt.

## 2018-07-08 NOTE — Assessment & Plan Note (Addendum)
>  25 minutes spent in face to face time with patient, >50% spent in counselling or coordination of care.  No chest pain now.  EKG without acute changes.  Discussed with patient about her previous episode. She used NTG x2 but didn't immediately help.  Eventually the pain went away.  She had some throat clearing.  No exertional sx.  Occ SOB with exertion at baseline, not worse recently.   Her symptoms did not improve with typical over-the-counter reflux medications.  Given her history I would prefer that she see cardiology.  Discussed with patient.  She agrees.  Referral ordered. See notes on stat labs.

## 2018-07-08 NOTE — Assessment & Plan Note (Signed)
TSH wnl.  No neck mass.   Compliant.  D/w pt.   Continue as is.

## 2018-07-08 NOTE — Assessment & Plan Note (Signed)
A1c 6.1.  She has been working on diet and exercise as tolerated.  Not diabetic based on A1c. We can recheck periodically.  No new medications needed for diabetes.  Discussed with patient.

## 2018-07-17 ENCOUNTER — Ambulatory Visit: Payer: Medicare Other | Admitting: Cardiovascular Disease

## 2018-07-17 VITALS — BP 142/84 | HR 90 | Ht 67.0 in | Wt 176.0 lb

## 2018-07-17 DIAGNOSIS — E782 Mixed hyperlipidemia: Secondary | ICD-10-CM | POA: Diagnosis not present

## 2018-07-17 DIAGNOSIS — I10 Essential (primary) hypertension: Secondary | ICD-10-CM | POA: Diagnosis not present

## 2018-07-17 DIAGNOSIS — R0789 Other chest pain: Secondary | ICD-10-CM

## 2018-07-17 MED ORDER — CARVEDILOL 6.25 MG PO TABS: 6.2500 mg | ORAL_TABLET | Freq: Two times a day (BID) | ORAL | 3 refills | Status: DC

## 2018-07-17 NOTE — Progress Notes (Signed)
Cardiology Office Note:    Date:  07/19/2018   ID:  Kelly Kennedy, DOB 10-13-1941, MRN [id number]  PCP:  [name], MD  Cardiologist:  No primary care provider on file.   Referring MD: [name], MD   Chief Complaint  Patient presents with  . Follow-up  . Shortness of Breath  . Headache  . Edema    Ankles.    History of Present Illness:    Kelly Kennedy is a 77 y.o. female with a hx of multiple coronary risk factors (hypertension, hypercholesterolemia, type 2 diabetes mellitus), palpitations, previous TIA, probable previous pericarditis, presents with an episode of "chest indigestion" that happened in church about 2 weeks ago, while at rest.  She saw Dr. Damita Dunnings who ordered an ECG (normal) and cardiac enzymes (also normal).  Just a few days before that she been able to mow her own lawn with a push.  She has been concerned and has not performed any serious physical activity since then.  She continues to describe palpitations (" water hitting my heart").  She had a low risk nuclear stress test in 2015 and a normal echo in 2016.  She underwent surgery for cancer of the left breast.  She did not receive radiation therapy.  She does take a proton pump inhibitor for acid reflux.  Past Medical History:  Diagnosis Date  . adenomatous Colon polyps    colonoscopy 8/99, 12/02, 8/06  . Arthritis   . Bell's palsy   . Cancer [facility])    DCIS left breast  . Chest pain    uses NTG as needed  . Depression   . Diabetes mellitus without complication ([place])   . Diverticulosis of colon 06/2005  . Ductal carcinoma in situ (DCIS) of left breast   . Family history of adverse reaction to anesthesia    " my mother didn't wake up so they had to put her on life support for about an hour or more; my sisiter has problems waking up too from anesthesia.  Marland Kitchen GERD (gastroesophageal reflux disease)   . H/O hiatal hernia   . Headache(784.0)   . Hyperglycemia   . Hyperlipidemia   .  Hypertension   . Hypothyroid   . Insomnia   . Irritable bowel   . Menopausal symptoms   . Pericarditis 1980's  . Stroke ([place])   . TIA (transient ischemic attack)    09/2014    Past Surgical History:  Procedure Laterality Date  . APPENDECTOMY    . BREAST EXCISIONAL BIOPSY Right    benign  . BREAST RECONSTRUCTION WITH PLACEMENT OF TISSUE EXPANDER AND FLEX HD (ACELLULAR HYDRATED DERMIS) Left 08/06/2015   Procedure: LEFT BREAST RECONSTRUCTION WITH PLACEMENT OF SALINE IMPLANT AND ACELLULAR DURMAL MATRIX;  Surgeon: Crissie Reese, MD;  Location: [place];  Service: Plastics;  Laterality: Left;  . CARDIAC CATHETERIZATION    . CATARACT EXTRACTION Bilateral 2016  . CHOLECYSTECTOMY  1990  . CYSTECTOMY  05/04/08   left lower arm, benign  . ECTOPIC PREGNANCY SURGERY  1971  . FRACTURE SURGERY     nose  . MASTECTOMY Left   . NIPPLE SPARING MASTECTOMY Left 08/06/2015   Procedure: LEFT NIPPLE SPARING MASTECTOMY;  Surgeon: [name], MD;  Location: [place];  Service: General;  Laterality: Left;  . ORIF ANKLE FRACTURE Left 10/23/2013   Procedure: OPEN REDUCTION INTERNAL FIXATION (ORIF) LEFT ANKLE FRACTURE;  Surgeon: Marianna Payment, MD;  Location: [place];  Service: Orthopedics;  Laterality:  Left;  . PARTIAL HYSTERECTOMY     ovaries intact  . PARTIAL MASTECTOMY WITH NEEDLE LOCALIZATION AND AXILLARY SENTINEL LYMPH NODE BX Left 05/06/2015   Procedure: LEFT BREAST PARTIAL MASTECTOMY WITH NEEDLE LOCALIZATION AND SENTINEL LYMPH NODE MAPPING;  Surgeon: [name], MD;  Location: [place];  Service: General;  Laterality: Left;  . TONSILLECTOMY    . TUBAL LIGATION      Current Medications: Current Meds  Medication Sig  . amitriptyline (ELAVIL) 50 MG tablet TAKE 1 TO 2 TABLETS BY MOUTH AT BEDTIME AS NEEDED FOR SLEEP  . Aspirin-Salicylamide-Caffeine (BC HEADACHE POWDER PO) Take 1 Package by mouth daily as needed (pain).   Marland Kitchen diclofenac (VOLTAREN) 75 MG EC tablet Take 1 tablet (75 mg total)  by mouth 2 (two) times daily.  . fluocinonide cream (LIDEX) 0.05 % APPLY TOPICALLY DAILY AS NEEDED. Use sparingly.  Marland Kitchen levothyroxine (SYNTHROID, LEVOTHROID) 75 MCG tablet TAKE 1 TABLET BY MOUTH EVERY DAY BEFORE BREAKFAST  . magnesium oxide (MAG-OX) 400 (241.3 Mg) MG tablet TAKE 1 TABLET BY MOUTH DAILY.  . nitroGLYCERIN (NITROSTAT) 0.4 MG SL tablet PLACE 1 TABLET (0.4 MG TOTAL) UNDER THE TONGUE EVERY 5 (FIVE) MINUTES AS NEEDED FOR CHEST PAIN.  Marland Kitchen omeprazole (PRILOSEC OTC) 20 MG tablet Take 20 mg by mouth daily.  . tamoxifen (NOLVADEX) 20 MG tablet TAKE 1 TABLET (20 MG TOTAL) BY MOUTH DAILY.  . traMADol (ULTRAM) 50 MG tablet TAKE 1 TABLET EVERY 8 HOURS AS NEEDED FOR PAIN  . VOLTAREN 1 % GEL APPLY 2-4 GRAMS TO AFFECTED AREA TWICE DAILY  . [DISCONTINUED] amLODipine (NORVASC) 5 MG tablet TAKE 1 TABLET BY MOUTH DAILY     Allergies:   Ace inhibitors; Angiotensin receptor blockers; Statins; Flonase [fluticasone propionate]; Glipizide; Niacin; and Red dye   Social History   Socioeconomic History  . Marital status: Widowed    Spouse name: Not on file  . Number of children: 3  . Years of education: Not on file  . Highest education level: Not on file  Occupational History  . Occupation: retired, previous Engineer, manufacturing systems  Social Needs  . Financial resource strain: Not on file  . Food insecurity:    Worry: Not on file    Inability: Not on file  . Transportation needs:    Medical: Not on file    Non-medical: Not on file  Tobacco Use  . Smoking status: Former Smoker    Last attempt to quit: 04/03/2017    Years since quitting: 1.2  . Smokeless tobacco: Former Systems developer  . Tobacco comment: PT STATES SHE DOES NOT SMOKE.  Substance and Sexual Activity  . Alcohol use: No    Alcohol/week: 0.0 standard drinks    Comment: social  . Drug use: No  . Sexual activity: Never  Lifestyle  . Physical activity:    Days per week: Not on file    Minutes per session: Not on file  . Stress: Not on file    Relationships  . Social connections:    Talks on phone: Not on file    Gets together: Not on file    Attends religious service: Not on file    Active member of club or organization: Not on file    Attends meetings of clubs or organizations: Not on file    Relationship status: Not on file  Other Topics Concern  . Not on file  Social History Narrative   From [place].   3 children, one died from MI  in 2006   Married 1960   Husband and brother died in the same week in Dec 20, 2012      Patient is right handed.   Patient has hs education.   Patient drinks 1 cup of soda daily, and green tea.     Family History: The patient's family history includes Arthritis in her mother; Cancer in her brother and father; Diabetes in her mother; Heart disease in her son; Stomach cancer in her father. There is no history of Colon cancer, Pancreatic cancer, or Breast cancer.  ROS:   Please see the history of present illness.     All other systems reviewed and are negative.  EKGs/Labs/Other Studies Reviewed:    The following studies were reviewed today: Notes from Dr. Damita Dunnings.  Previous echo and nuclear stress test.  EKG:  EKG is not ordered today.  The ekg ordered July 06, 2018 demonstrates normal sinus rhythm without Q waves of the physician abnormalities  Recent Labs: 07/02/2018: TSH 1.10 07/06/2018: ALT 23; BUN 11; Creatinine, Ser 0.84; Hemoglobin 12.7; Platelets 306.0; Potassium 4.0; Sodium 143  Recent Lipid Panel    Component Value Date/Time   CHOL 241 (H) 10/27/2017 0756   CHOL 208 (H) 09/28/2016 1438   TRIG 270.0 (H) 10/27/2017 0756   TRIG 203 10/03/2012   HDL 55.70 10/27/2017 0756   HDL 63 09/28/2016 1438   CHOLHDL 4 10/27/2017 0756   VLDL 54.0 (H) 10/27/2017 0756   LDLCALC 111 (H) 09/28/2016 1438   LDLDIRECT 154.0 10/27/2017 0756    Physical Exam:    VS:  BP (!) 142/84 (BP Location: Right Arm, Patient Position: Sitting, Cuff Size: Normal)   Pulse 90   Ht 5\' 7"  (1.702 m)    Wt 176 lb (79.8 kg)   BMI 27.57 kg/m     Wt Readings from Last 3 Encounters:  07/17/18 176 lb (79.8 kg)  07/06/18 175 lb 4 oz (79.5 kg)  07/03/18 175 lb 1.6 oz (79.4 kg)     GEN:  Well nourished, well developed in no acute distress HEENT: Normal NECK: No JVD; No carotid bruits LYMPHATICS: No lymphadenopathy CARDIAC: RRR, no murmurs, rubs, gallops RESPIRATORY:  Clear to auscultation without rales, wheezing or rhonchi  ABDOMEN: Soft, non-tender, non-distended MUSCULOSKELETAL:  No edema; No deformity  SKIN: Warm and dry NEUROLOGIC:  Alert and oriented x 3 PSYCHIATRIC:  Normal affect   ASSESSMENT:    1. Atypical chest pain   2. Essential hypertension   3. Mixed hyperlipidemia    PLAN:    In order of problems listed above:  1. Chest pain: Without high risk features on ECG or enzymes.  She does have numerous coronary risk factors.  We will schedule for an outpatient coronary CT angiogram. 2. HTN: Well-controlled usually, slightly high today.  Since there is suspicion for CAD we will switch her to a beta-blocker, which should also help with her palpitations and allow [place] to get better images on her coronary CT. 3. HLP: LDL cholesterol is quite high at 154 last last November.  She is reportedly seriously allergic to statins.  If coronary disease is identified we will try to see if we can start her on PCSK9 inhibitors.    Medication Adjustments/Labs and Tests Ordered: Current medicines are reviewed at length with the patient today.  Concerns regarding medicines are outlined above.  Orders Placed This Encounter  Procedures  . CT CORONARY MORPH W/CTA COR W/SCORE W/CA W/CM &/OR WO/CM  . CT CORONARY FRACTIONAL FLOW RESERVE DATA  PREP  . CT CORONARY FRACTIONAL FLOW RESERVE FLUID ANALYSIS  . Basic metabolic panel   Meds ordered this encounter  Medications  . carvedilol (COREG) 6.25 MG tablet    Sig: Take 1 tablet (6.25 mg total) by mouth 2 (two) times daily.    Dispense:  180 tablet     Refill:  3    Patient Instructions  Medication Instructions: Dr Sallyanne Kuster has recommended making the following medication changes: 1. STOP Amlodipine 2. START Carvedilol 6.25 mg TWICE daily  Labwork: Your physician recommends that you return for lab work prior to your cardiac CT.   Testing/Procedures: 1. Cardiac CT - Your physician has requested that you have cardiac CT. Cardiac computed tomography (CT) is a painless test that uses an x-ray machine to take clear, detailed pictures of your heart. For further information please visit [facility]. Please follow instruction sheet as given.  Follow-up: Your physician recommends that you schedule a follow-up appointment in 3 months with a NP/PA.  Dr Sallyanne Kuster recommends that you schedule a follow-up appointment in 12 months. You will receive a reminder letter in the mail two months in advance. If you don't receive a letter, please call our office to schedule the follow-up appointment.  If you need a refill on your cardiac medications before your next appointment, please call your pharmacy.   Please arrive at the [facility] main entrance of [facility] at __________ AM (30-45 minutes prior to test start time)  [facility] [address] [place], [place] [address] [phone]  Proceed to the [facility] Radiology Department (First Floor).  Please follow these instructions carefully (unless otherwise directed):  On the Night Before the Test: . Drink plenty of water. . Do not consume any caffeinated/decaffeinated beverages or chocolate 12 hours prior to your test. . Do not take any antihistamines 12 hours prior to your test.  On the Day of the Test: . Drink plenty of water. Do not drink any water within one hour of the test. . Do not eat any food 4 hours prior to the test. . You may take your regular medications prior to the test.  After the Test: . Drink plenty of water. . After receiving IV  contrast, you may experience a mild flushed feeling. This is normal. . On occasion, you may experience a mild rash up to 24 hours after the test. This is not dangerous. If this occurs, you can take Benadryl 25 mg and increase your fluid intake. . If you experience trouble breathing, this can be serious. If it is severe call 911 IMMEDIATELY. If it is mild, please call our office. . If you take any of these medications: Glipizide/Metformin, Avandament, Glucavance, please do not take 48 hours after completing test.     Signed, Sanda Klein, MD  07/19/2018 1:27 PM    [facility]

## 2018-07-17 NOTE — Patient Instructions (Addendum)
Medication Instructions: Dr Sallyanne Kuster has recommended making the following medication changes: 1. STOP Amlodipine 2. START Carvedilol 6.25 mg TWICE daily  Labwork: Your physician recommends that you return for lab work prior to your cardiac CT.   Testing/Procedures: 1. Cardiac CT - Your physician has requested that you have cardiac CT. Cardiac computed tomography (CT) is a painless test that uses an x-ray machine to take clear, detailed pictures of your heart. For further information please visit [facility]. Please follow instruction sheet as given.  Follow-up: Your physician recommends that you schedule a follow-up appointment in 3 months with a NP/PA.  Dr Sallyanne Kuster recommends that you schedule a follow-up appointment in 12 months. You will receive a reminder letter in the mail two months in advance. If you don't receive a letter, please call our office to schedule the follow-up appointment.  If you need a refill on your cardiac medications before your next appointment, please call your pharmacy.   Please arrive at the [facility] main entrance of [facility] at __________ AM (30-45 minutes prior to test start time)  [facility] [address] [place], [place] [address] [phone]  Proceed to the [facility] Radiology Department (First Floor).  Please follow these instructions carefully (unless otherwise directed):  On the Night Before the Test: . Drink plenty of water. . Do not consume any caffeinated/decaffeinated beverages or chocolate 12 hours prior to your test. . Do not take any antihistamines 12 hours prior to your test.  On the Day of the Test: . Drink plenty of water. Do not drink any water within one hour of the test. . Do not eat any food 4 hours prior to the test. . You may take your regular medications prior to the test.  After the Test: . Drink plenty of water. . After receiving IV contrast, you may experience a mild flushed  feeling. This is normal. . On occasion, you may experience a mild rash up to 24 hours after the test. This is not dangerous. If this occurs, you can take Benadryl 25 mg and increase your fluid intake. . If you experience trouble breathing, this can be serious. If it is severe call 911 IMMEDIATELY. If it is mild, please call our office. . If you take any of these medications: Glipizide/Metformin, Avandament, Glucavance, please do not take 48 hours after completing test.

## 2018-07-19 ENCOUNTER — Encounter: Payer: Self-pay | Admitting: Cardiovascular Disease

## 2018-07-21 ENCOUNTER — Other Ambulatory Visit: Payer: Self-pay | Admitting: Family Medicine

## 2018-07-23 NOTE — Telephone Encounter (Signed)
Electronic refill request Last office visit 07/06/18 Last refill 03/08/17 #25/3

## 2018-07-24 NOTE — Telephone Encounter (Signed)
Sent. Thanks.   

## 2018-07-27 ENCOUNTER — Telehealth: Payer: Self-pay | Admitting: Surgery

## 2018-07-27 ENCOUNTER — Other Ambulatory Visit: Payer: Self-pay

## 2018-07-27 ENCOUNTER — Emergency Department (HOSPITAL_COMMUNITY): Payer: Medicare Other

## 2018-07-27 ENCOUNTER — Emergency Department (HOSPITAL_COMMUNITY)
Admission: EM | Admit: 2018-07-27 | Discharge: 2018-07-27 | Disposition: A | Discharge status: Home / Self Care | Payer: Medicare Other | Attending: Emergency Medicine | Admitting: Emergency Medicine

## 2018-07-27 ENCOUNTER — Encounter (HOSPITAL_COMMUNITY): Payer: Self-pay | Admitting: Emergency Medicine

## 2018-07-27 DIAGNOSIS — S161XXA Strain of muscle, fascia and tendon at neck level, initial encounter: Secondary | ICD-10-CM

## 2018-07-27 DIAGNOSIS — Y939 Activity, unspecified: Secondary | ICD-10-CM | POA: Insufficient documentation

## 2018-07-27 DIAGNOSIS — Z87891 Personal history of nicotine dependence: Secondary | ICD-10-CM | POA: Diagnosis not present

## 2018-07-27 DIAGNOSIS — S0990XA Unspecified injury of head, initial encounter: Secondary | ICD-10-CM | POA: Insufficient documentation

## 2018-07-27 DIAGNOSIS — Y998 Other external cause status: Secondary | ICD-10-CM | POA: Insufficient documentation

## 2018-07-27 DIAGNOSIS — S199XXA Unspecified injury of neck, initial encounter: Secondary | ICD-10-CM | POA: Diagnosis not present

## 2018-07-27 DIAGNOSIS — Z8673 Personal history of transient ischemic attack (TIA), and cerebral infarction without residual deficits: Secondary | ICD-10-CM | POA: Insufficient documentation

## 2018-07-27 DIAGNOSIS — R918 Other nonspecific abnormal finding of lung field: Secondary | ICD-10-CM | POA: Diagnosis not present

## 2018-07-27 DIAGNOSIS — E039 Hypothyroidism, unspecified: Secondary | ICD-10-CM | POA: Diagnosis not present

## 2018-07-27 DIAGNOSIS — S8002XA Contusion of left knee, initial encounter: Secondary | ICD-10-CM | POA: Diagnosis not present

## 2018-07-27 DIAGNOSIS — I10 Essential (primary) hypertension: Secondary | ICD-10-CM | POA: Insufficient documentation

## 2018-07-27 DIAGNOSIS — E119 Type 2 diabetes mellitus without complications: Secondary | ICD-10-CM | POA: Insufficient documentation

## 2018-07-27 DIAGNOSIS — E785 Hyperlipidemia, unspecified: Secondary | ICD-10-CM | POA: Insufficient documentation

## 2018-07-27 DIAGNOSIS — S39012A Strain of muscle, fascia and tendon of lower back, initial encounter: Secondary | ICD-10-CM | POA: Diagnosis not present

## 2018-07-27 DIAGNOSIS — Y92410 Unspecified street and highway as the place of occurrence of the external cause: Secondary | ICD-10-CM | POA: Diagnosis not present

## 2018-07-27 DIAGNOSIS — Z79899 Other long term (current) drug therapy: Secondary | ICD-10-CM | POA: Insufficient documentation

## 2018-07-27 DIAGNOSIS — R0789 Other chest pain: Secondary | ICD-10-CM

## 2018-07-27 DIAGNOSIS — S8992XA Unspecified injury of left lower leg, initial encounter: Secondary | ICD-10-CM | POA: Diagnosis not present

## 2018-07-27 DIAGNOSIS — N289 Disorder of kidney and ureter, unspecified: Secondary | ICD-10-CM | POA: Diagnosis not present

## 2018-07-27 LAB — COMPREHENSIVE METABOLIC PANEL
ALBUMIN: 3.6 g/dL (ref 3.5–5.0)
ALK PHOS: 50 U/L (ref 38–126)
ALT: 19 U/L (ref 0–44)
AST: 40 U/L (ref 15–41)
Anion gap: 11 (ref 5–15)
BILIRUBIN TOTAL: 0.6 mg/dL (ref 0.3–1.2)
BUN: 11 mg/dL (ref 8–23)
CO2: 20 mmol/L — ABNORMAL LOW (ref 22–32)
Calcium: 9.2 mg/dL (ref 8.9–10.3)
Chloride: 108 mmol/L (ref 98–111)
Creatinine, Ser: 0.9 mg/dL (ref 0.44–1.00)
GFR calc Af Amer: >60 mL/min (ref >60)
GFR calc non Af Amer: >60 mL/min (ref >60)
GLUCOSE: 117 mg/dL — AB (ref 70–99)
POTASSIUM: 3.5 mmol/L (ref 3.5–5.1)
Sodium: 139 mmol/L (ref 135–145)
TOTAL PROTEIN: 6.4 g/dL — AB (ref 6.5–8.1)

## 2018-07-27 LAB — SAMPLE TO BLOOD BANK

## 2018-07-27 LAB — I-STAT CHEM 8, ED
BUN: 10 mg/dL (ref 8–23)
Calcium, Ion: 1.03 mmol/L — ABNORMAL LOW (ref 1.15–1.40)
Chloride: 110 mmol/L (ref 98–111)
Creatinine, Ser: 0.8 mg/dL (ref 0.44–1.00)
Glucose, Bld: 117 mg/dL — ABNORMAL HIGH (ref 70–99)
HCT: 35 % — ABNORMAL LOW (ref 36.0–46.0)
Hemoglobin: 11.9 g/dL — ABNORMAL LOW (ref 12.0–15.0)
Potassium: 3.4 mmol/L — ABNORMAL LOW (ref 3.5–5.1)
SODIUM: 140 mmol/L (ref 135–145)
TCO2: 19 mmol/L — AB (ref 22–32)

## 2018-07-27 LAB — CBC
HEMATOCRIT: 37.4 % (ref 36.0–46.0)
Hemoglobin: 12.4 g/dL (ref 12.0–15.0)
MCH: 29.1 pg (ref 26.0–34.0)
MCHC: 33.2 g/dL (ref 30.0–36.0)
MCV: 87.8 fL (ref 78.0–100.0)
Platelets: 328 10*3/uL (ref 150–400)
RBC: 4.26 MIL/uL (ref 3.87–5.11)
RDW: 13.8 % (ref 11.5–15.5)
WBC: 6 10*3/uL (ref 4.0–10.5)

## 2018-07-27 LAB — I-STAT CG4 LACTIC ACID, ED: LACTIC ACID, VENOUS: 1.71 mmol/L (ref 0.5–1.9)

## 2018-07-27 LAB — I-STAT TROPONIN, ED: TROPONIN I, POC: 0 ng/mL (ref 0.00–0.08)

## 2018-07-27 LAB — PROTIME-INR
INR: 1.03
Prothrombin Time: 13.4 seconds (ref 11.4–15.2)

## 2018-07-27 LAB — ETHANOL: Alcohol, Ethyl (B): <10 mg/dL (ref <10)

## 2018-07-27 MED ORDER — IOPAMIDOL (ISOVUE-300) INJECTION 61%
100.0000 mL | Freq: Once | INTRAVENOUS | Status: AC | PRN
  Administered 2018-07-27: 100 mL via INTRAVENOUS

## 2018-07-27 MED ORDER — ORPHENADRINE CITRATE ER 100 MG PO TB12: 100.0000 mg | ORAL_TABLET | Freq: Two times a day (BID) | ORAL | 0 refills | Status: DC

## 2018-07-27 MED ORDER — ACETAMINOPHEN 500 MG PO TABS: 1000.0000 mg | ORAL_TABLET | Freq: Four times a day (QID) | ORAL | 0 refills | Status: DC | PRN

## 2018-07-27 MED ORDER — ACETAMINOPHEN 500 MG PO TABS
1000.0000 mg | ORAL_TABLET | Freq: Once | ORAL | Status: AC
  Administered 2018-07-27: 1000 mg via ORAL
  Filled 2018-07-27: qty 2

## 2018-07-27 MED ORDER — ORPHENADRINE CITRATE ER 100 MG PO TB12
100.0000 mg | ORAL_TABLET | Freq: Two times a day (BID) | ORAL | Status: DC
  Administered 2018-07-27: 100 mg via ORAL
  Filled 2018-07-27: qty 1

## 2018-07-27 MED ORDER — IOPAMIDOL (ISOVUE-300) INJECTION 61%
INTRAVENOUS | Status: AC
  Filled 2018-07-27: qty 100

## 2018-07-27 NOTE — Telephone Encounter (Signed)
ED CM received call from patient stating Norflex prescription is not covered by her insurance. CM discussed with B. Rona Ravens EDP.  Medication was switched to Flexiril 10mg  PO TID PRN and called in to [facility] on [address] per patient's request.  Patient was updated with change, no further ED CM needs noted.

## 2018-07-27 NOTE — ED Provider Notes (Signed)
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           Charlesetta Shanks, MD 07/27/18 1530

## 2018-07-27 NOTE — ED Triage Notes (Signed)
To ED via [facility] from [facility] on [address]-- pt fully immobilized-removed from back board on arrival to ED. pt is alert/oriented x 4.

## 2018-07-27 NOTE — Discharge Instructions (Signed)
1.  Use cool compresses on any areas of pain or swelling for the first 2 days after your injury.  After the first 2 days, use warm moist heat on sore muscles and areas of strain. 2.  Take acetaminophen every 6 hours for pain as needed.  May also take Norflex twice daily for muscle spasms. 3.  Follow the discharge instructions for return precautions. 4.  Schedule a follow-up with your family doctor at the beginning of the week for recheck.  Make your doctor aware that your CT scans showed small nodules that need to be reviewed either by your family doctor or your oncologist.

## 2018-07-28 ENCOUNTER — Telehealth: Payer: Self-pay | Admitting: Family Medicine

## 2018-07-28 DIAGNOSIS — K769 Liver disease, unspecified: Secondary | ICD-10-CM

## 2018-07-28 LAB — CDS SEROLOGY
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
## 2018-08-01 NOTE — Telephone Encounter (Signed)
Noted. Thanks.  I'll await input from the eye clinic and the MRI results.

## 2018-08-04 ENCOUNTER — Ambulatory Visit (HOSPITAL_COMMUNITY)
Admission: RE | Admit: 2018-08-04 | Discharge: 2018-08-04 | Disposition: A | Discharge status: Home / Self Care | Payer: Medicare Other | Source: Ambulatory Visit | Attending: Family Medicine | Admitting: Family Medicine

## 2018-08-04 ENCOUNTER — Other Ambulatory Visit: Payer: Self-pay | Admitting: Family Medicine

## 2018-08-04 ENCOUNTER — Encounter (HOSPITAL_COMMUNITY): Payer: Self-pay

## 2018-08-04 DIAGNOSIS — K769 Liver disease, unspecified: Secondary | ICD-10-CM

## 2018-08-04 DIAGNOSIS — K760 Fatty (change of) liver, not elsewhere classified: Secondary | ICD-10-CM | POA: Insufficient documentation

## 2018-08-04 DIAGNOSIS — K7689 Other specified diseases of liver: Secondary | ICD-10-CM | POA: Insufficient documentation

## 2018-08-04 MED ORDER — GADOBUTROL 1 MMOL/ML IV SOLN
10.0000 mL | Freq: Once | INTRAVENOUS | Status: AC | PRN
  Administered 2018-08-04: 8 mL via INTRAVENOUS

## 2018-08-05 ENCOUNTER — Encounter: Payer: Self-pay | Admitting: Family Medicine

## 2018-08-05 DIAGNOSIS — D1803 Hemangioma of intra-abdominal structures: Secondary | ICD-10-CM | POA: Insufficient documentation

## 2018-08-05 DIAGNOSIS — K760 Fatty (change of) liver, not elsewhere classified: Secondary | ICD-10-CM | POA: Insufficient documentation

## 2018-08-06 ENCOUNTER — Ambulatory Visit (INDEPENDENT_AMBULATORY_CARE_PROVIDER_SITE_OTHER): Payer: Medicare Other | Admitting: Family Medicine

## 2018-08-06 ENCOUNTER — Encounter: Payer: Self-pay | Admitting: Family Medicine

## 2018-08-06 VITALS — BP 144/76 | HR 93 | Temp 98.7°F | Ht 67.0 in | Wt 172.5 lb

## 2018-08-06 DIAGNOSIS — R911 Solitary pulmonary nodule: Secondary | ICD-10-CM | POA: Diagnosis not present

## 2018-08-06 DIAGNOSIS — D1803 Hemangioma of intra-abdominal structures: Secondary | ICD-10-CM

## 2018-08-06 DIAGNOSIS — K760 Fatty (change of) liver, not elsewhere classified: Secondary | ICD-10-CM | POA: Diagnosis not present

## 2018-08-06 DIAGNOSIS — S060X1D Concussion with loss of consciousness of 30 minutes or less, subsequent encounter: Secondary | ICD-10-CM
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
## 2018-08-06 NOTE — Telephone Encounter (Signed)
Sent. Thanks.   

## 2018-08-06 NOTE — Telephone Encounter (Signed)
Electronic refill request Last office visit 07/06/18 Last refill 12/13/17  30 G/3 refills

## 2018-08-06 NOTE — Patient Instructions (Addendum)
You had lung nodules noted.  No treatment now.  Recheck CAT scan in 1 year.  Liver pictures- small collection of blood vessels- not a tumor.  No sign of cancer.  You do have fatty deposits in your liver.  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   Presumed concussion.  "Brain rest" for now.  Use tylenol and hot showers for the sore muscles.   Update me if the headaches continue.  Take care.  Glad to see you.

## 2018-08-08 DIAGNOSIS — R911 Solitary pulmonary nodule: Secondary | ICD-10-CM | POA: Insufficient documentation

## 2018-08-08 DIAGNOSIS — S060X1A Concussion with loss of consciousness of 30 minutes or less, initial encounter: Secondary | ICD-10-CM | POA: Insufficient documentation

## 2018-08-08 NOTE — Assessment & Plan Note (Signed)
Presumed concussion.  She likely has some headaches related to that, as a residual symptom.  She also has some likely muscle soreness as described above.  Discussed with patient about concussion pathophysiology and options.  We talked about "brain rest" as that tends to help with recovery.  She agrees and understood.  She will update me as needed.  Continue current medications.  Can use Tylenol as needed for short-term use.  She has a normal neurologic exam and is okay for outpatient follow-up. >25 minutes spent in face to face time with patient, >50% spent in counselling or coordination of care.

## 2018-08-08 NOTE — Assessment & Plan Note (Signed)
Previous imaging discussed with patient, along with follow-up imaging.  MRI d/w pt.  No sign of cancer in the liver. She has a small collections of blood vessels that caused the abnormal signal. Not a tumor. She understood.  She has no abdominal pain.  She has fatty deposits in the liver. Tx is to continue work on diet and control her BP/sugar/etc.   discussed with patient.  She understood.

## 2018-08-08 NOTE — Assessment & Plan Note (Signed)
  Previous imaging discussed with patient, along with follow-up imaging.  MRI d/w pt.  No sign of cancer in the liver. She has a small collections of blood vessels that caused the abnormal signal. Not a tumor. She understood.  She has no abdominal pain.

## 2018-08-08 NOTE — Assessment & Plan Note (Signed)
D/w pt about pulmonary nodules on CT.  Recommend non contrast chest CT can be considered in 12 months given the patient's history of breast cancer.  She understood.

## 2018-08-18 ENCOUNTER — Other Ambulatory Visit: Payer: Self-pay | Admitting: Family Medicine

## 2018-08-20 ENCOUNTER — Ambulatory Visit: Payer: Medicare Other | Admitting: Cardiovascular Disease

## 2018-08-28 ENCOUNTER — Other Ambulatory Visit: Payer: Self-pay | Admitting: Family Medicine

## 2018-08-29 ENCOUNTER — Ambulatory Visit (HOSPITAL_COMMUNITY)
Admission: RE | Admit: 2018-08-29 | Discharge: 2018-08-29 | Disposition: A | Discharge status: Home / Self Care | Payer: Medicare Other | Source: Ambulatory Visit | Attending: Cardiovascular Disease | Admitting: Cardiovascular Disease

## 2018-08-29 DIAGNOSIS — R0789 Other chest pain: Secondary | ICD-10-CM

## 2018-08-29 DIAGNOSIS — I2510 Atherosclerotic heart disease of native coronary artery without angina pectoris: Secondary | ICD-10-CM | POA: Insufficient documentation

## 2018-08-29 DIAGNOSIS — R918 Other nonspecific abnormal finding of lung field: Secondary | ICD-10-CM | POA: Insufficient documentation

## 2018-08-29 MED ORDER — DILTIAZEM LOAD VIA INFUSION: 5.0000 mg | Freq: Once | INTRAVENOUS | Status: DC

## 2018-08-29 MED ORDER — DILTIAZEM HCL 25 MG/5ML IV SOLN
INTRAVENOUS | Status: AC
  Administered 2018-08-29: 5 mg
  Filled 2018-08-29: qty 5

## 2018-08-29 MED ORDER — METOPROLOL TARTRATE 5 MG/5ML IV SOLN
INTRAVENOUS | Status: AC
  Filled 2018-08-29: qty 30

## 2018-08-29 MED ORDER — IOPAMIDOL (ISOVUE-370) INJECTION 76%
100.0000 mL | Freq: Once | INTRAVENOUS | Status: AC | PRN
  Administered 2018-08-29: 80 mL via INTRAVENOUS

## 2018-08-29 MED ORDER — NITROGLYCERIN 0.4 MG SL SUBL
0.8000 mg | SUBLINGUAL_TABLET | Freq: Once | SUBLINGUAL | Status: AC
  Administered 2018-08-29: 0.8 mg via SUBLINGUAL
  Filled 2018-08-29: qty 25

## 2018-08-29 MED ORDER — NITROGLYCERIN 0.4 MG SL SUBL
SUBLINGUAL_TABLET | SUBLINGUAL | Status: AC
  Filled 2018-08-29: qty 2

## 2018-08-29 MED ORDER — METOPROLOL TARTRATE 5 MG/5ML IV SOLN
10.0000 mg | INTRAVENOUS | Status: AC | PRN
  Administered 2018-08-29 (×3): 10 mg via INTRAVENOUS
  Filled 2018-08-29 (×4): qty 10

## 2018-08-29 MED ORDER — DILTIAZEM HCL 25 MG/5ML IV SOLN
5.0000 mg | INTRAVENOUS | Status: DC | PRN
  Administered 2018-08-29: 5 mg via INTRAVENOUS
  Filled 2018-08-29: qty 5

## 2018-08-29 NOTE — Progress Notes (Signed)
CT complete. Patient denies any complaints. VSS.Offered patient snack, stated she was going to eat after leaving.

## 2018-08-29 NOTE — Progress Notes (Signed)
Patient ambulatory out of department with family. Denies any complaints.

## 2018-08-30 DIAGNOSIS — R0789 Other chest pain: Secondary | ICD-10-CM | POA: Diagnosis not present

## 2018-09-02 ENCOUNTER — Other Ambulatory Visit: Payer: Self-pay | Admitting: Family Medicine

## 2018-09-05 DIAGNOSIS — H40023 Open angle with borderline findings, high risk, bilateral: Secondary | ICD-10-CM | POA: Diagnosis not present

## 2018-09-05 DIAGNOSIS — H353131 Nonexudative age-related macular degeneration, bilateral, early dry stage: Secondary | ICD-10-CM | POA: Diagnosis not present

## 2018-09-05 DIAGNOSIS — H5340 Unspecified visual field defects: Secondary | ICD-10-CM | POA: Diagnosis not present

## 2018-09-05 DIAGNOSIS — H3589 Other specified retinal disorders: Secondary | ICD-10-CM | POA: Diagnosis not present

## 2018-09-05 DIAGNOSIS — H04123 Dry eye syndrome of bilateral lacrimal glands: Secondary | ICD-10-CM | POA: Diagnosis not present

## 2018-09-11 ENCOUNTER — Other Ambulatory Visit: Payer: Self-pay | Admitting: *Deleted

## 2018-09-11 ENCOUNTER — Ambulatory Visit: Payer: Medicare Other | Admitting: Cardiovascular Disease

## 2018-09-11 VITALS — BP 158/90 | HR 88 | Ht 63.0 in | Wt 175.2 lb

## 2018-09-11 DIAGNOSIS — I2510 Atherosclerotic heart disease of native coronary artery without angina pectoris: Secondary | ICD-10-CM | POA: Diagnosis not present

## 2018-09-11 DIAGNOSIS — I10 Essential (primary) hypertension: Secondary | ICD-10-CM | POA: Diagnosis not present

## 2018-09-11 DIAGNOSIS — R079 Chest pain, unspecified: Secondary | ICD-10-CM

## 2018-09-11 DIAGNOSIS — E7800 Pure hypercholesterolemia, unspecified: Secondary | ICD-10-CM | POA: Diagnosis not present

## 2018-09-11 DIAGNOSIS — I25118 Atherosclerotic heart disease of native coronary artery with other forms of angina pectoris: Secondary | ICD-10-CM

## 2018-09-11 DIAGNOSIS — R931 Abnormal findings on diagnostic imaging of heart and coronary circulation: Secondary | ICD-10-CM | POA: Diagnosis not present

## 2018-09-11 NOTE — Patient Instructions (Signed)
  [place] [address] Dept: [phone] Loc: [phone]  Kelly Kennedy  09/11/2018  You are scheduled for a Cardiac Catheterization on Friday, October 25 with Dr. Glenetta Hew.  1. Please arrive at the [facility] (Main Entrance A) at [facility]: [address] [place], [place] [address] at 5:30 AM (This time is two hours before your procedure to ensure your preparation). Free valet parking service is available.   Special note: Every effort is made to have your procedure done on time. Please understand that emergencies sometimes delay scheduled procedures.  2. Diet: Do not eat solid foods after midnight.  The patient may have clear liquids until 5am upon the day of the procedure.  3. Labs: You will need to have blood drawn on Tuesday, October 25 at [place]  Open: [place] (Lunch 12:30 - 1:30)   Phone: [phone]. You do not need to be fasting.  4. Medication instructions in preparation for your procedure:   Contrast Allergy: No  HOLD Diclofenac (Voltaren) on Friday, October 25th. RESTART on Saturday, October 26th.  On the morning of your procedure, take your Aspirin and any morning medicines NOT listed above.  You may use sips of water.  5. Plan for one night stay--bring personal belongings.  6. Bring a current list of your medications and current insurance cards.  7. You MUST have a responsible person to drive you home.  8. Someone MUST be with you the first 24 hours after you arrive home or your discharge will be delayed.  9. Please wear clothes that are easy to get on and off and wear slip-on shoes.  Thank you for allowing [place] to care for you!   -- [place] [facility]
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
## 2018-09-11 NOTE — Telephone Encounter (Signed)
Faxed refill request. Nitroglycerin Last office visit:   08/06/18 Last Filled:    25 tablet 0 07/24/2018  Please advise.  Patient states she lost the bottle.

## 2018-09-11 NOTE — H&P (View-Only) (Signed)
Cardiology Office Note:    Date:  09/12/2018   ID:  Kelly Kennedy, DOB 30-Oct-1941, MRN [id number]  PCP:  [name], MD  Cardiologist:  No primary care provider on file.   Referring MD: [name], MD   Chief Complaint  Patient presents with  . Chest Pain    Abnormal coronary CTA    History of Present Illness:    Kelly Kennedy is a 77 y.o. female with a hx of multiple coronary risk factors (hypertension, hypercholesterolemia, type 2 diabetes mellitus), palpitations, previous TIA, probable previous pericarditis, returning in follow-up for an abnormal coronary CT angiogram that showed a high-grade stenosis in the mid left circumflex coronary artery (large dominant vessel feeding the inferior wall), confirmed to be significant by CT FFR of 0.75.  The CT also showed a calcium score of 274 which places her on the 76th percentile.  She has not had any significant chest pain since her last appointment.  She has not really tried any difficult physical exertion.  She has a history of intolerance to statins, stating "they have tried everyone".  She remembers trying both atorvastatin and rosuvastatin and had to stop because of myalgia.  LDL cholesterol was around 150.  Also with a history of angioedema with ACE inhibitors.  She had a low risk nuclear stress test in 2015 and a normal echo in 2016. She underwent surgery for cancer of the left breast.  She did not receive radiation therapy.  She does take a proton pump inhibitor for acid reflux.  Past Medical History:  Diagnosis Date  . adenomatous Colon polyps    colonoscopy 8/99, 12/02, 8/06  . Arthritis   . Bell's palsy   . Cancer [facility])    DCIS left breast  . Chest pain    uses NTG as needed  . Depression   . Diabetes mellitus without complication ([place])   . Diverticulosis of colon 06/2005  . Ductal carcinoma in situ (DCIS) of left breast   . Family history of adverse reaction to anesthesia    " my mother didn't  wake up so they had to put her on life support for about an hour or more; my sisiter has problems waking up too from anesthesia.  Marland Kitchen GERD (gastroesophageal reflux disease)   . H/O hiatal hernia   . Headache(784.0)   . Hyperglycemia   . Hyperlipidemia   . Hypertension   . Hypothyroid   . Insomnia   . Irritable bowel   . Menopausal symptoms   . Pericarditis 1980's  . Stroke ([place])   . TIA (transient ischemic attack)    09/2014    Past Surgical History:  Procedure Laterality Date  . APPENDECTOMY    . BREAST EXCISIONAL BIOPSY Right    benign  . BREAST RECONSTRUCTION WITH PLACEMENT OF TISSUE EXPANDER AND FLEX HD (ACELLULAR HYDRATED DERMIS) Left 08/06/2015   Procedure: LEFT BREAST RECONSTRUCTION WITH PLACEMENT OF SALINE IMPLANT AND ACELLULAR DURMAL MATRIX;  Surgeon: Crissie Reese, MD;  Location: [facility];  Service: Plastics;  Laterality: Left;  . CARDIAC CATHETERIZATION    . CATARACT EXTRACTION Bilateral 2016  . CHOLECYSTECTOMY  1990  . CYSTECTOMY  05/04/08   left lower arm, benign  . ECTOPIC PREGNANCY SURGERY  1971  . FRACTURE SURGERY     nose  . MASTECTOMY Left   . NIPPLE SPARING MASTECTOMY Left 08/06/2015   Procedure: LEFT NIPPLE SPARING MASTECTOMY;  Surgeon: [name], MD;  Location: [facility];  Service: General;  Laterality: Left;  . ORIF ANKLE FRACTURE Left 10/23/2013   Procedure: OPEN REDUCTION INTERNAL FIXATION (ORIF) LEFT ANKLE FRACTURE;  Surgeon: Marianna Payment, MD;  Location: [facility];  Service: Orthopedics;  Laterality: Left;  . PARTIAL HYSTERECTOMY     ovaries intact  . PARTIAL MASTECTOMY WITH NEEDLE LOCALIZATION AND AXILLARY SENTINEL LYMPH NODE BX Left 05/06/2015   Procedure: LEFT BREAST PARTIAL MASTECTOMY WITH NEEDLE LOCALIZATION AND SENTINEL LYMPH NODE MAPPING;  Surgeon: [name], MD;  Location: [facility];  Service: General;  Laterality: Left;  . TONSILLECTOMY    . TUBAL LIGATION      Current Medications: Current Meds  Medication Sig  .  acetaminophen (TYLENOL) 500 MG tablet Take 2 tablets (1,000 mg total) by mouth every 6 (six) hours as needed.  Marland Kitchen amitriptyline (ELAVIL) 50 MG tablet TAKE 1 TO 2 TABLETS BY MOUTH AT BEDTIME AS NEEDED FOR SLEEP  . amLODipine (NORVASC) 5 MG tablet TAKE 1 TABLET BY MOUTH DAILY  . Aspirin-Salicylamide-Caffeine (BC HEADACHE POWDER PO) Take 1 Package by mouth daily as needed (pain).   . carvedilol (COREG) 6.25 MG tablet Take 1 tablet (6.25 mg total) by mouth 2 (two) times daily.  . diclofenac (VOLTAREN) 75 MG EC tablet Take 1 tablet (75 mg total) by mouth 2 (two) times daily.  . fluocinonide cream (LIDEX) 0.05 % APPLY TOPICALLY DAILY AS NEEDED. USE SPARINGLY.  Marland Kitchen levothyroxine (SYNTHROID, LEVOTHROID) 75 MCG tablet TAKE 1 TABLET BY MOUTH EVERY DAY BEFORE BREAKFAST  . magnesium oxide (MAG-OX) 400 MG tablet TAKE 1 TABLET BY MOUTH DAILY.  . nitroGLYCERIN (NITROSTAT) 0.4 MG SL tablet PLACE 1 TABLET (0.4 MG TOTAL) UNDER THE TONGUE EVERY 5 (FIVE) MINUTES AS NEEDED FOR CHEST PAIN.  Marland Kitchen omeprazole (PRILOSEC OTC) 20 MG tablet Take 20 mg by mouth daily.  . orphenadrine (NORFLEX) 100 MG tablet Take 1 tablet (100 mg total) by mouth 2 (two) times daily.  . tamoxifen (NOLVADEX) 20 MG tablet TAKE 1 TABLET (20 MG TOTAL) BY MOUTH DAILY.  . traMADol (ULTRAM) 50 MG tablet TAKE 1 TABLET EVERY 8 HOURS AS NEEDED FOR PAIN  . VOLTAREN 1 % GEL APPLY 2-4 GRAMS TO AFFECTED AREA TWICE DAILY     Allergies:   Ace inhibitors; Angiotensin receptor blockers; Statins; Flonase [fluticasone propionate]; Glipizide; Niacin; and Red dye   Social History   Socioeconomic History  . Marital status: Widowed    Spouse name: Not on file  . Number of children: 3  . Years of education: Not on file  . Highest education level: Not on file  Occupational History  . Occupation: retired, previous Engineer, manufacturing systems  Social Needs  . Financial resource strain: Not on file  . Food insecurity:    Worry: Not on file    Inability: Not on file  .  Transportation needs:    Medical: Not on file    Non-medical: Not on file  Tobacco Use  . Smoking status: Former Smoker    Last attempt to quit: 04/03/2017    Years since quitting: 1.4  . Smokeless tobacco: Former Systems developer  . Tobacco comment: PT STATES SHE DOES NOT SMOKE.  Substance and Sexual Activity  . Alcohol use: No    Alcohol/week: 0.0 standard drinks    Comment: social  . Drug use: No  . Sexual activity: Never  Lifestyle  . Physical activity:    Days per week: Not on file    Minutes per session: Not on file  . Stress: Not on file  Relationships  . Social connections:    Talks on phone: Not on file    Gets together: Not on file    Attends religious service: Not on file    Active member of club or organization: Not on file    Attends meetings of clubs or organizations: Not on file    Relationship status: Not on file  Other Topics Concern  . Not on file  Social History Narrative   From [place].   3 children, one died from MI in 24-Feb-2005   Married 1959-02-25   Husband and brother died in the same week in 26-Nov-2012      Patient is right handed.   Patient has hs education.   Patient drinks 1 cup of soda daily, and green tea.     Family History: The patient's family history includes Arthritis in her mother; Cancer in her brother and father; Diabetes in her mother; Heart disease in her son; Stomach cancer in her father. There is no history of Colon cancer, Pancreatic cancer, or Breast cancer.  ROS:   Please see the history of present illness.     All other systems reviewed and are negative.  EKGs/Labs/Other Studies Reviewed:    The following studies were reviewed today: Notes from Dr. Damita Dunnings.  Previous echo and nuclear stress test.  EKG:  EKG is ordered today and shows normal sinus rhythm with probable left atrial abnormality and LVH, nonspecific T wave changes, QTC 445 ms  Recent Labs: 07/02/2018: TSH 1.10 07/27/2018: ALT 19 09/11/2018: BUN 13; Creatinine, Ser 0.85;  Hemoglobin 13.0; Platelets 341; Potassium 5.0; Sodium 145  Recent Lipid Panel    Component Value Date/Time   CHOL 204 (H) 09/11/2018 1157   TRIG 133 09/11/2018 1157   TRIG 203 10/03/2012   HDL 70 09/11/2018 1157   CHOLHDL 2.9 09/11/2018 1157   CHOLHDL 4 10/27/2017 0756   VLDL 54.0 (H) 10/27/2017 0756   LDLCALC 107 (H) 09/11/2018 1157   LDLDIRECT 154.0 10/27/2017 0756    Physical Exam:    VS:  BP (!) 158/90   Pulse 88   Ht 5\' 3"  (1.6 m)   Wt 175 lb 3.2 oz (79.5 kg)   BMI 31.04 kg/m     Wt Readings from Last 3 Encounters:  09/11/18 175 lb 3.2 oz (79.5 kg)  08/06/18 172 lb 8 oz (78.2 kg)  08/04/18 175 lb (79.4 kg)     General: Alert, oriented x3, no distress Head: no evidence of trauma, PERRL, EOMI, no exophtalmos or lid lag, no myxedema, no xanthelasma; normal ears, nose and oropharynx Neck: normal jugular venous pulsations and no hepatojugular reflux; brisk carotid pulses without delay and no carotid bruits Chest: clear to auscultation, no signs of consolidation by percussion or palpation, normal fremitus, symmetrical and full respiratory excursions Cardiovascular: normal position and quality of the apical impulse, regular rhythm, normal first and second heart sounds, no murmurs, rubs or gallops Abdomen: no tenderness or distention, no masses by palpation, no abnormal pulsatility or arterial bruits, normal bowel sounds, no hepatosplenomegaly Extremities: no clubbing, cyanosis or edema; 2+ radial, ulnar and brachial pulses bilaterally; 2+ right femoral, posterior tibial and dorsalis pedis pulses; 2+ left femoral, posterior tibial and dorsalis pedis pulses; no subclavian or femoral bruits Neurological: grossly nonfocal Psych: Normal mood and affect'  ASSESSMENT:    1. Coronary artery disease involving native coronary artery of native heart with other form of angina pectoris ([place])   2. Essential hypertension   3. Hypercholesteremia  4. Exertional chest pain   5. Stenosis of  left circumflex coronary artery    PLAN:    In order of problems listed above:  1. CAD: She has evidence of a high-grade and physiologically significant stenosis in the dominant left circumflex coronary artery on coronary CT angiogram and she is symptomatic.  No significant stenosis in the other coronary arteries.  She should undergo conventional angiography and if possible, percutaneous revascularization with angioplasty stent. This procedure has been fully reviewed with the patient and written informed consent has been obtained.  She has widespread coronary atherosclerosis and will require aggressive treatment of her risk factors.  She is taking a beta-blocker and is to take daily aspirin 81 mg. 2. HTN: Elevated today, but she is a little nervous.  Switched to a beta-blocker which has helped the palpitations.  May need further titration of medications.  No history of angioedema with RAAS inhibitors 3. HLP: Today we started the process of initiating treatment with PCSK9 inhibitors.  Target LDL cholesterol less than 70.   Medication Adjustments/Labs and Tests Ordered: Current medicines are reviewed at length with the patient today.  Concerns regarding medicines are outlined above.  Orders Placed This Encounter  Procedures  . Basic metabolic panel  . CBC  . Protime-INR  . Lipid panel  . EKG 12-Lead   No orders of the defined types were placed in this encounter.   Patient Instructions   [place] [address] Dept: [phone] Loc: [phone]  Kelly Kennedy  09/11/2018  You are scheduled for a Cardiac Catheterization on Friday, October 25 with Dr. Glenetta Hew.  1. Please arrive at the [facility] (Main Entrance A) at [facility]: [address] [place], [place] [address] at 5:30 AM (This time is two hours before your procedure to ensure your  preparation). Free valet parking service is available.   Special note: Every effort is made to have your procedure done on time. Please understand that emergencies sometimes delay scheduled procedures.  2. Diet: Do not eat solid foods after midnight.  The patient may have clear liquids until 5am upon the day of the procedure.  3. Labs: You will need to have blood drawn on Tuesday, October 25 at [facility]  Open: [place] (Lunch 12:30 - 1:30)   Phone: [phone]. You do not need to be fasting.  4. Medication instructions in preparation for your procedure:   Contrast Allergy: No  HOLD Diclofenac (Voltaren) on Friday, October 25th. RESTART on Saturday, October 26th.  On the morning of your procedure, take your Aspirin and any morning medicines NOT listed above.  You may use sips of water.  5. Plan for one night stay--bring personal belongings.  6. Bring a current list of your medications and current insurance cards.  7. You MUST have a responsible person to drive you home.  8. Someone MUST be with you the first 24 hours after you arrive home or your discharge will be delayed.  9. Please wear clothes that are easy to get on and off and wear slip-on shoes.  Thank you for allowing [place] to care for you!   -- [facility]    Signed, Sanda Klein, MD  09/12/2018 9:13 AM    [place]

## 2018-09-12 ENCOUNTER — Encounter: Payer: Self-pay | Admitting: Cardiovascular Disease

## 2018-09-12 ENCOUNTER — Ambulatory Visit: Payer: Medicare Other | Admitting: Cardiovascular Disease

## 2018-09-12 DIAGNOSIS — I2510 Atherosclerotic heart disease of native coronary artery without angina pectoris: Secondary | ICD-10-CM | POA: Insufficient documentation

## 2018-09-12 LAB — LIPID PANEL
CHOLESTEROL TOTAL: 204 mg/dL — AB (ref 100–199)
Chol/HDL Ratio: 2.9 ratio (ref 0.0–4.4)
HDL: 70 mg/dL (ref >39)
LDL Calculated: 107 mg/dL — ABNORMAL HIGH (ref 0–99)
TRIGLYCERIDES: 133 mg/dL (ref 0–149)
VLDL CHOLESTEROL CAL: 27 mg/dL (ref 5–40)

## 2018-09-12 LAB — BASIC METABOLIC PANEL
BUN / CREAT RATIO: 15 (ref 12–28)
BUN: 13 mg/dL (ref 8–27)
CALCIUM: 10.2 mg/dL (ref 8.7–10.3)
CHLORIDE: 105 mmol/L (ref 96–106)
CO2: 25 mmol/L (ref 20–29)
Creatinine, Ser: 0.85 mg/dL (ref 0.57–1.00)
GFR calc Af Amer: 77 mL/min/{1.73_m2} (ref >59)
GFR calc non Af Amer: 67 mL/min/{1.73_m2} (ref >59)
GLUCOSE: 89 mg/dL (ref 65–99)
Potassium: 5 mmol/L (ref 3.5–5.2)
Sodium: 145 mmol/L — ABNORMAL HIGH (ref 134–144)

## 2018-09-12 LAB — CBC
HEMATOCRIT: 39.7 % (ref 34.0–46.6)
Hemoglobin: 13 g/dL (ref 11.1–15.9)
MCH: 28.9 pg (ref 26.6–33.0)
MCHC: 32.7 g/dL (ref 31.5–35.7)
MCV: 88 fL (ref 79–97)
Platelets: 341 10*3/uL (ref 150–450)
RBC: 4.5 x10E6/uL (ref 3.77–5.28)
RDW: 13.9 % (ref 12.3–15.4)
WBC: 7.1 10*3/uL (ref 3.4–10.8)

## 2018-09-12 LAB — PROTIME-INR
INR: 1.1 (ref 0.8–1.2)
Prothrombin Time: 11.2 s (ref 9.1–12.0)

## 2018-09-12 MED ORDER — NITROGLYCERIN 0.4 MG SL SUBL: SUBLINGUAL_TABLET | SUBLINGUAL | 1 refills | Status: DC

## 2018-09-12 NOTE — Telephone Encounter (Signed)
Sent. Thanks.   

## 2018-09-13 ENCOUNTER — Other Ambulatory Visit: Payer: Self-pay | Admitting: Oncology

## 2018-09-19 ENCOUNTER — Telehealth: Payer: Self-pay | Admitting: *Deleted

## 2018-09-19 NOTE — Telephone Encounter (Signed)
Pt contacted pre-catheterization scheduled at [facility] for: Friday September 21, 2018 7:30 AM Verified arrival time and place: [place] Entrance A at: 5:30 AM  No solid food after midnight prior to cath, clear liquids until 5 AM day of procedure. Contrast allergy: no Verified no diabetes medications:  Hold: Voltaren -day of procedure.  Except hold medications AM meds can be  taken pre-cath with sip of water including: ASA 81 mg  Confirmed patient has responsible person to drive home post procedure and for 24 hours after you arrive home: yes

## 2018-09-20 DIAGNOSIS — R931 Abnormal findings on diagnostic imaging of heart and coronary circulation: Secondary | ICD-10-CM | POA: Diagnosis present

## 2018-09-21 ENCOUNTER — Encounter (HOSPITAL_COMMUNITY)
Admission: RE | Disposition: A | Discharge status: Home / Self Care | Payer: Self-pay | Source: Ambulatory Visit | Attending: Cardiology

## 2018-09-21 ENCOUNTER — Ambulatory Visit (HOSPITAL_COMMUNITY): Admit: 2018-09-21 | Payer: Medicare Other | Admitting: Cardiology

## 2018-09-21 ENCOUNTER — Other Ambulatory Visit: Payer: Self-pay

## 2018-09-21 ENCOUNTER — Ambulatory Visit (HOSPITAL_COMMUNITY)
Admission: RE | Admit: 2018-09-21 | Discharge: 2018-09-21 | Disposition: A | Discharge status: Home / Self Care | Payer: Medicare Other | Source: Ambulatory Visit | Attending: Cardiology | Admitting: Cardiology

## 2018-09-21 DIAGNOSIS — E7800 Pure hypercholesterolemia, unspecified: Secondary | ICD-10-CM | POA: Insufficient documentation

## 2018-09-21 DIAGNOSIS — Z9889 Other specified postprocedural states: Secondary | ICD-10-CM | POA: Insufficient documentation

## 2018-09-21 DIAGNOSIS — Z79890 Hormone replacement therapy (postmenopausal): Secondary | ICD-10-CM | POA: Insufficient documentation

## 2018-09-21 DIAGNOSIS — I208 Other forms of angina pectoris: Secondary | ICD-10-CM | POA: Diagnosis present

## 2018-09-21 DIAGNOSIS — M1990 Unspecified osteoarthritis, unspecified site: Secondary | ICD-10-CM | POA: Diagnosis not present

## 2018-09-21 DIAGNOSIS — E119 Type 2 diabetes mellitus without complications: Secondary | ICD-10-CM | POA: Insufficient documentation

## 2018-09-21 DIAGNOSIS — Z79899 Other long term (current) drug therapy: Secondary | ICD-10-CM | POA: Insufficient documentation

## 2018-09-21 DIAGNOSIS — F329 Major depressive disorder, single episode, unspecified: Secondary | ICD-10-CM | POA: Insufficient documentation

## 2018-09-21 DIAGNOSIS — R931 Abnormal findings on diagnostic imaging of heart and coronary circulation: Secondary | ICD-10-CM | POA: Diagnosis not present

## 2018-09-21 DIAGNOSIS — I2584 Coronary atherosclerosis due to calcified coronary lesion: Secondary | ICD-10-CM | POA: Diagnosis not present

## 2018-09-21 DIAGNOSIS — K589 Irritable bowel syndrome without diarrhea: Secondary | ICD-10-CM | POA: Insufficient documentation

## 2018-09-21 DIAGNOSIS — Z9012 Acquired absence of left breast and nipple: Secondary | ICD-10-CM | POA: Insufficient documentation

## 2018-09-21 DIAGNOSIS — Z9049 Acquired absence of other specified parts of digestive tract: Secondary | ICD-10-CM | POA: Insufficient documentation

## 2018-09-21 DIAGNOSIS — I25118 Atherosclerotic heart disease of native coronary artery with other forms of angina pectoris: Secondary | ICD-10-CM | POA: Diagnosis not present

## 2018-09-21 DIAGNOSIS — Z809 Family history of malignant neoplasm, unspecified: Secondary | ICD-10-CM | POA: Insufficient documentation

## 2018-09-21 DIAGNOSIS — G510 Bell's palsy: Secondary | ICD-10-CM | POA: Insufficient documentation

## 2018-09-21 DIAGNOSIS — Z86010 Personal history of colonic polyps: Secondary | ICD-10-CM | POA: Insufficient documentation

## 2018-09-21 DIAGNOSIS — I219 Acute myocardial infarction, unspecified: Secondary | ICD-10-CM | POA: Diagnosis not present

## 2018-09-21 DIAGNOSIS — Z833 Family history of diabetes mellitus: Secondary | ICD-10-CM | POA: Insufficient documentation

## 2018-09-21 DIAGNOSIS — K219 Gastro-esophageal reflux disease without esophagitis: Secondary | ICD-10-CM | POA: Insufficient documentation

## 2018-09-21 DIAGNOSIS — R002 Palpitations: Secondary | ICD-10-CM | POA: Diagnosis not present

## 2018-09-21 DIAGNOSIS — I10 Essential (primary) hypertension: Secondary | ICD-10-CM | POA: Diagnosis not present

## 2018-09-21 DIAGNOSIS — Z9842 Cataract extraction status, left eye: Secondary | ICD-10-CM | POA: Insufficient documentation

## 2018-09-21 DIAGNOSIS — I2510 Atherosclerotic heart disease of native coronary artery without angina pectoris: Secondary | ICD-10-CM | POA: Diagnosis present

## 2018-09-21 DIAGNOSIS — G4700 Insomnia, unspecified: Secondary | ICD-10-CM | POA: Insufficient documentation

## 2018-09-21 DIAGNOSIS — Z9841 Cataract extraction status, right eye: Secondary | ICD-10-CM | POA: Insufficient documentation

## 2018-09-21 DIAGNOSIS — Z90711 Acquired absence of uterus with remaining cervical stump: Secondary | ICD-10-CM | POA: Diagnosis not present

## 2018-09-21 DIAGNOSIS — Z800 Family history of malignant neoplasm of digestive organs: Secondary | ICD-10-CM | POA: Insufficient documentation

## 2018-09-21 DIAGNOSIS — Z87891 Personal history of nicotine dependence: Secondary | ICD-10-CM | POA: Diagnosis not present

## 2018-09-21 DIAGNOSIS — Z888 Allergy status to other drugs, medicaments and biological substances status: Secondary | ICD-10-CM | POA: Diagnosis not present

## 2018-09-21 DIAGNOSIS — Z7982 Long term (current) use of aspirin: Secondary | ICD-10-CM | POA: Insufficient documentation

## 2018-09-21 DIAGNOSIS — E039 Hypothyroidism, unspecified: Secondary | ICD-10-CM | POA: Insufficient documentation

## 2018-09-21 DIAGNOSIS — Z853 Personal history of malignant neoplasm of breast: Secondary | ICD-10-CM | POA: Insufficient documentation

## 2018-09-21 DIAGNOSIS — Z8249 Family history of ischemic heart disease and other diseases of the circulatory system: Secondary | ICD-10-CM | POA: Insufficient documentation

## 2018-09-21 DIAGNOSIS — Z8673 Personal history of transient ischemic attack (TIA), and cerebral infarction without residual deficits: Secondary | ICD-10-CM | POA: Insufficient documentation

## 2018-09-21 DIAGNOSIS — Z955 Presence of coronary angioplasty implant and graft: Secondary | ICD-10-CM | POA: Insufficient documentation

## 2018-09-21 HISTORY — PX: LEFT HEART CATH AND CORONARY ANGIOGRAPHY: CATH118249

## 2018-09-21 LAB — GLUCOSE, CAPILLARY
Glucose-Capillary: 99 mg/dL (ref 70–99)
Glucose-Capillary: 105 mg/dL — ABNORMAL HIGH (ref 70–99)

## 2018-09-21 SURGERY — LEFT HEART CATH AND CORONARY ANGIOGRAPHY
Anesthesia: LOCAL

## 2018-09-21 SURGERY — INVASIVE LAB ABORTED CASE

## 2018-09-21 MED ORDER — SODIUM CHLORIDE 0.9 % IV SOLN: INTRAVENOUS | Status: AC

## 2018-09-21 MED ORDER — HEPARIN (PORCINE) IN NACL 1000-0.9 UT/500ML-% IV SOLN
INTRAVENOUS | Status: DC | PRN
  Administered 2018-09-21 (×2): 500 mL

## 2018-09-21 MED ORDER — MIDAZOLAM HCL 2 MG/2ML IJ SOLN
INTRAMUSCULAR | Status: DC | PRN
  Administered 2018-09-21: 1 mg via INTRAVENOUS

## 2018-09-21 MED ORDER — VERAPAMIL HCL 2.5 MG/ML IV SOLN
INTRAVENOUS | Status: AC
  Filled 2018-09-21: qty 2

## 2018-09-21 MED ORDER — FENTANYL CITRATE (PF) 100 MCG/2ML IJ SOLN
INTRAMUSCULAR | Status: DC | PRN
  Administered 2018-09-21: 25 ug via INTRAVENOUS

## 2018-09-21 MED ORDER — ONDANSETRON HCL 4 MG/2ML IJ SOLN: 4.0000 mg | Freq: Four times a day (QID) | INTRAMUSCULAR | Status: DC | PRN

## 2018-09-21 MED ORDER — IOHEXOL 350 MG/ML SOLN
INTRAVENOUS | Status: DC | PRN
  Administered 2018-09-21: 60 mL via INTRA_ARTERIAL

## 2018-09-21 MED ORDER — LIDOCAINE HCL (PF) 1 % IJ SOLN
INTRAMUSCULAR | Status: AC
  Filled 2018-09-21: qty 30

## 2018-09-21 MED ORDER — ASPIRIN 81 MG PO CHEW
81.0000 mg | CHEWABLE_TABLET | ORAL | Status: AC
  Administered 2018-09-21: 81 mg via ORAL

## 2018-09-21 MED ORDER — ASPIRIN 81 MG PO CHEW
CHEWABLE_TABLET | ORAL | Status: AC
  Administered 2018-09-21: 81 mg via ORAL
  Filled 2018-09-21: qty 1

## 2018-09-21 MED ORDER — MIDAZOLAM HCL 2 MG/2ML IJ SOLN
INTRAMUSCULAR | Status: AC
  Filled 2018-09-21: qty 2

## 2018-09-21 MED ORDER — SODIUM CHLORIDE 0.9% FLUSH: 3.0000 mL | Freq: Two times a day (BID) | INTRAVENOUS | Status: DC

## 2018-09-21 MED ORDER — LIDOCAINE HCL (PF) 1 % IJ SOLN
INTRAMUSCULAR | Status: DC | PRN
  Administered 2018-09-21: 5 mL

## 2018-09-21 MED ORDER — HEPARIN (PORCINE) IN NACL 1000-0.9 UT/500ML-% IV SOLN
INTRAVENOUS | Status: AC
  Filled 2018-09-21: qty 1000

## 2018-09-21 MED ORDER — SODIUM CHLORIDE 0.9% FLUSH: 3.0000 mL | INTRAVENOUS | Status: DC | PRN

## 2018-09-21 MED ORDER — FENTANYL CITRATE (PF) 100 MCG/2ML IJ SOLN
INTRAMUSCULAR | Status: AC
  Filled 2018-09-21: qty 2

## 2018-09-21 MED ORDER — VERAPAMIL HCL 2.5 MG/ML IV SOLN
INTRAVENOUS | Status: DC | PRN
  Administered 2018-09-21: 10 mL via INTRA_ARTERIAL

## 2018-09-21 MED ORDER — SODIUM CHLORIDE 0.9 % IV SOLN: 250.0000 mL | INTRAVENOUS | Status: DC | PRN

## 2018-09-21 MED ORDER — ACETAMINOPHEN 325 MG PO TABS: 650.0000 mg | ORAL_TABLET | ORAL | Status: DC | PRN

## 2018-09-21 MED ORDER — SODIUM CHLORIDE 0.9 % WEIGHT BASED INFUSION
3.0000 mL/kg/h | INTRAVENOUS | Status: AC
  Administered 2018-09-21: 3 mL/kg/h via INTRAVENOUS

## 2018-09-21 MED ORDER — HEPARIN SODIUM (PORCINE) 1000 UNIT/ML IJ SOLN
INTRAMUSCULAR | Status: DC | PRN
  Administered 2018-09-21: 4000 [IU] via INTRAVENOUS

## 2018-09-21 MED ORDER — HEPARIN SODIUM (PORCINE) 1000 UNIT/ML IJ SOLN
INTRAMUSCULAR | Status: AC
  Filled 2018-09-21: qty 1

## 2018-09-21 MED ORDER — SODIUM CHLORIDE 0.9 % WEIGHT BASED INFUSION: 1.0000 mL/kg/h | INTRAVENOUS | Status: DC

## 2018-09-21 SURGICAL SUPPLY — 8 items
GLIDESHEATH SLEND A-KIT 6F 22G (SHEATH) IMPLANT
GUIDEWIRE INQWIRE 1.5J.035X260 (WIRE) IMPLANT
INQWIRE 1.5J .035X260CM (WIRE)
KIT HEART LEFT (KITS) ×1 IMPLANT
PACK CARDIAC CATHETERIZATION (CUSTOM PROCEDURE TRAY) ×1 IMPLANT
SYR MEDRAD MARK V 150ML (SYRINGE) ×1 IMPLANT
TRANSDUCER W/STOPCOCK (MISCELLANEOUS) ×1 IMPLANT
TUBING CIL FLEX 10 FLL-RA (TUBING) ×1 IMPLANT

## 2018-09-21 SURGICAL SUPPLY — 11 items
CATH INFINITI 5FR ANG PIGTAIL (CATHETERS) ×1 IMPLANT
CATH OPTITORQUE TIG 4.0 5F (CATHETERS) ×1 IMPLANT
DEVICE RAD COMP TR BAND LRG (VASCULAR PRODUCTS) ×1 IMPLANT
GLIDESHEATH SLEND A-KIT 6F 22G (SHEATH) ×1 IMPLANT
GUIDEWIRE INQWIRE 1.5J.035X260 (WIRE) IMPLANT
INQWIRE 1.5J .035X260CM (WIRE) ×2
KIT HEART LEFT (KITS) ×2 IMPLANT
PACK CARDIAC CATHETERIZATION (CUSTOM PROCEDURE TRAY) ×2 IMPLANT
SHEATH PROBE COVER 6X72 (BAG) ×1 IMPLANT
TRANSDUCER W/STOPCOCK (MISCELLANEOUS) ×2 IMPLANT
TUBING CIL FLEX 10 FLL-RA (TUBING) ×2 IMPLANT

## 2018-09-21 NOTE — Discharge Instructions (Signed)

## 2018-09-21 NOTE — Interval H&P Note (Signed)
History and Physical Interval Note:  09/21/2018 7:35 AM  Kelly Kennedy  has presented today for surgery, with the diagnosis of abnormal ct with atypical angina.   The various methods of treatment have been discussed with the patient and family. After consideration of risks, benefits and other options for treatment, the patient has consented to  Procedure(s): LEFT HEART CATH AND CORONARY ANGIOGRAPHY ([name]) with possible PERCUTANEOUS CORONARY INTERVENTION as a surgical intervention .  The patient's history has been reviewed, patient examined, no change in status, stable for surgery.  I have reviewed the patient's chart and labs.  Questions were answered to the patient's satisfaction.    Cath Lab Visit (complete for each Cath Lab visit)  Clinical Evaluation Leading to the Procedure:   ACS: No.  Non-ACS:    Anginal Classification: CCS II  Anti-ischemic medical therapy: Maximal Therapy (2 or more classes of medications)  Non-Invasive Test Results: High-risk stress test findings: cardiac mortality >3%/year -- Cardiac CTA with HIGH RISK FFR  Prior CABG: No previous CABG    Glenetta Hew

## 2018-09-21 NOTE — Progress Notes (Signed)
Kelly Kennedy has come to the cath lab holding area for monitoring pre procedure after STEMI page and subsequent procedure delay. She denies pain at this time, is resting comfortably, and family has been updated. All vital signs are WNL.

## 2018-09-21 NOTE — Progress Notes (Signed)
Thank you MCr 

## 2018-09-21 NOTE — Progress Notes (Signed)
Just for review as a QC for CT-FFR. Asal Teas

## 2018-09-24 ENCOUNTER — Encounter: Payer: Self-pay | Admitting: Gastroenterology

## 2018-09-24 ENCOUNTER — Encounter (HOSPITAL_COMMUNITY): Payer: Self-pay | Admitting: Cardiology

## 2018-09-24 MED FILL — Verapamil HCl IV Soln 2.5 MG/ML: INTRAVENOUS | Qty: 2 | Status: AC

## 2018-09-26 ENCOUNTER — Other Ambulatory Visit: Payer: Self-pay | Admitting: Pharmacist Clinician (PhC)/ Clinical Pharmacy Specialist

## 2018-09-26 MED ORDER — EVOLOCUMAB 140 MG/ML ~~LOC~~ SOAJ: 140.0000 mg | SUBCUTANEOUS | 12 refills | Status: DC

## 2018-10-01 ENCOUNTER — Telehealth: Payer: Self-pay

## 2018-10-01 ENCOUNTER — Ambulatory Visit: Payer: Medicare Other

## 2018-10-01 DIAGNOSIS — Z961 Presence of intraocular lens: Secondary | ICD-10-CM | POA: Diagnosis not present

## 2018-10-01 DIAGNOSIS — E119 Type 2 diabetes mellitus without complications: Secondary | ICD-10-CM | POA: Diagnosis not present

## 2018-10-01 DIAGNOSIS — H40013 Open angle with borderline findings, low risk, bilateral: Secondary | ICD-10-CM | POA: Diagnosis not present

## 2018-10-01 DIAGNOSIS — H26492 Other secondary cataract, left eye: Secondary | ICD-10-CM | POA: Diagnosis not present

## 2018-10-01 NOTE — Telephone Encounter (Signed)
Patient's family member calls team health at 7:41am today to reschedule patients appt with Lindell Noe.  Appointment has been rescheduled to 10/03/18 and patient is aware.   Thanks.

## 2018-10-02 ENCOUNTER — Other Ambulatory Visit: Payer: Self-pay | Admitting: Family Medicine

## 2018-10-02 DIAGNOSIS — E119 Type 2 diabetes mellitus without complications: Secondary | ICD-10-CM

## 2018-10-02 DIAGNOSIS — Z8639 Personal history of other endocrine, nutritional and metabolic disease: Secondary | ICD-10-CM

## 2018-10-03 ENCOUNTER — Ambulatory Visit: Payer: Medicare Other

## 2018-10-03 ENCOUNTER — Other Ambulatory Visit (INDEPENDENT_AMBULATORY_CARE_PROVIDER_SITE_OTHER): Payer: Medicare Other

## 2018-10-03 DIAGNOSIS — Z8639 Personal history of other endocrine, nutritional and metabolic disease: Secondary | ICD-10-CM

## 2018-10-03 LAB — POCT GLYCOSYLATED HEMOGLOBIN (HGB A1C): HEMOGLOBIN A1C: 6 % — AB (ref 4.0–5.6)

## 2018-10-04 ENCOUNTER — Telehealth: Payer: Self-pay | Admitting: Family Medicine

## 2018-10-04 DIAGNOSIS — Z8673 Personal history of transient ischemic attack (TIA), and cerebral infarction without residual deficits: Secondary | ICD-10-CM

## 2018-10-04 NOTE — Telephone Encounter (Signed)
Pt called stating she spoke with Dr.Duncan and was told she needed to go to neurology. Pt was told by [facility] that she needs a referral. [phone], Please put in order for pt.

## 2018-10-05 NOTE — Telephone Encounter (Signed)
Ordered. Thanks

## 2018-10-11 ENCOUNTER — Ambulatory Visit: Payer: Medicare Other | Admitting: Cardiovascular Disease

## 2018-10-11 ENCOUNTER — Encounter: Payer: Self-pay | Admitting: Cardiovascular Disease

## 2018-10-11 VITALS — BP 132/78 | HR 85 | Ht 63.0 in | Wt 172.4 lb

## 2018-10-11 DIAGNOSIS — I25118 Atherosclerotic heart disease of native coronary artery with other forms of angina pectoris: Secondary | ICD-10-CM

## 2018-10-11 DIAGNOSIS — I493 Ventricular premature depolarization: Secondary | ICD-10-CM | POA: Diagnosis not present

## 2018-10-11 DIAGNOSIS — I10 Essential (primary) hypertension: Secondary | ICD-10-CM | POA: Diagnosis not present

## 2018-10-11 DIAGNOSIS — E785 Hyperlipidemia, unspecified: Secondary | ICD-10-CM | POA: Diagnosis not present

## 2018-10-11 NOTE — Patient Instructions (Signed)
Medication Instructions:  Dr Sallyanne Kuster recommends that you continue on your current medications as directed. Please refer to the Current Medication list given to you today.  If you need a refill on your cardiac medications before your next appointment, please call your pharmacy.   Lab work: Your physician recommends that you return for lab work in 3 months - FASTING.  If you have labs (blood work) drawn today and your tests are completely normal, you will receive your results only by: Marland Kitchen MyChart Message (if you have MyChart) OR . A paper copy in the mail If you have any lab test that is abnormal or we need to change your treatment, we will call you to review the results.  Follow-Up: At [facility], you and your health needs are our priority.  As part of our continuing mission to provide you with exceptional heart care, we have created designated Provider Care Teams.  These Care Teams include your primary Cardiologist (physician) and Advanced Practice Providers (APPs -  Physician Assistants and Nurse Practitioners) who all work together to provide you with the care you need, when you need it. You will need a follow up appointment in 12 months.  Please call our office 2 months in advance to schedule this appointment.  You may see Sanda Klein, MD or one of the following Advanced Practice Providers on your designated Care Team: [place], [place] . Fabian Sharp, PA-C

## 2018-10-11 NOTE — Progress Notes (Signed)
Cardiology Office Note:    Date:  10/11/2018   ID:  Kelly Kennedy, DOB 1941/07/26, MRN [id number]  PCP:  [name], MD  Cardiologist:  Sanda Klein, MD   Referring MD: [name], MD   Chief Complaint  Patient presents with  . Follow-up    Cardiac catheterization    History of Present Illness:    Kelly Kennedy is a 77 y.o. female with a hx of multiple coronary risk factors (hypertension, hypercholesterolemia, type 2 diabetes mellitus), palpitations, previous TIA, probable previous pericarditis, returning in follow-up after cardiac catheterization 09/21/2018 for an abnormal coronary CT angiogram that showed a high-grade stenosis in the mid left circumflex coronary artery, CT FFR of 0.75.  The CT also showed a calcium score of 274 which places her on the 76th percentile.  Cardiac catheterization did not confirm the presence of a single discrete severe stenosis in the LCx coronary artery:   Dist Cx lesion is 45% stenosed -angiographically moderate at best disease. Does not explain CT scan.  There is hyperdynamic left ventricular systolic function. The left ventricular ejection fraction is greater than 65% by visual estimate.  LV end diastolic pressure is normal.   Mild to moderate disease distal dominant Circumflex.  There is no culprit lesion to explain CT scan findings. Otherwise essentially normal coronary arteries and a left dominant system. Normal LVEF with hyperdynamic function.  The patient had a brief run of PAT/SVT during injection.  She has not had any significant chest pain since her last appointment.  Is taking beta-blockers now.  She has a history of intolerance to statins, stating "they have tried everyone".  She remembers trying both atorvastatin and rosuvastatin and had to stop because of myalgia.  LDL cholesterol was around 150.  Also with a history of angioedema with ACE inhibitors.  She has received her prescription for Repatha, but has  yet to actually take the first dose.  It only cost her $8.  She had a low risk nuclear stress test in 2015 and a normal echo in 2016. She underwent surgery for cancer of the left breast.  She did not receive radiation therapy.  She does take a proton pump inhibitor for acid reflux.  Past Medical History:  Diagnosis Date  . adenomatous Colon polyps    colonoscopy 8/99, 12/02, 8/06  . Arthritis   . Bell's palsy   . Cancer [facility])    DCIS left breast  . Chest pain    uses NTG as needed  . Depression   . Diabetes mellitus without complication ([place])   . Diverticulosis of colon 06/2005  . Ductal carcinoma in situ (DCIS) of left breast   . Family history of adverse reaction to anesthesia    " my mother didn't wake up so they had to put her on life support for about an hour or more; my sisiter has problems waking up too from anesthesia.  Marland Kitchen GERD (gastroesophageal reflux disease)   . H/O hiatal hernia   . Headache(784.0)   . Hyperglycemia   . Hyperlipidemia   . Hypertension   . Hypothyroid   . Insomnia   . Irritable bowel   . Menopausal symptoms   . Pericarditis 1980's  . Stroke ([place])   . TIA (transient ischemic attack)    09/2014    Past Surgical History:  Procedure Laterality Date  . APPENDECTOMY    . BREAST EXCISIONAL BIOPSY Right    benign  . BREAST RECONSTRUCTION WITH PLACEMENT OF  TISSUE EXPANDER AND FLEX HD (ACELLULAR HYDRATED DERMIS) Left 08/06/2015   Procedure: LEFT BREAST RECONSTRUCTION WITH PLACEMENT OF SALINE IMPLANT AND ACELLULAR DURMAL MATRIX;  Surgeon: Crissie Reese, MD;  Location: [place];  Service: Plastics;  Laterality: Left;  . CARDIAC CATHETERIZATION    . CATARACT EXTRACTION Bilateral 2016  . CHOLECYSTECTOMY  1990  . CYSTECTOMY  05/04/08   left lower arm, benign  . ECTOPIC PREGNANCY SURGERY  1971  . FRACTURE SURGERY     nose  . LEFT HEART CATH AND CORONARY ANGIOGRAPHY [name] 09/21/2018   Procedure: LEFT HEART CATH AND CORONARY ANGIOGRAPHY;  Surgeon: Leonie Man,  MD;  Location: [place] [facility];  Service: Cardiovascular;  Laterality: [name];  . MASTECTOMY Left   . NIPPLE SPARING MASTECTOMY Left 08/06/2015   Procedure: LEFT NIPPLE SPARING MASTECTOMY;  Surgeon: [name], MD;  Location: [place];  Service: General;  Laterality: Left;  . ORIF ANKLE FRACTURE Left 10/23/2013   Procedure: OPEN REDUCTION INTERNAL FIXATION (ORIF) LEFT ANKLE FRACTURE;  Surgeon: Marianna Payment, MD;  Location: [place];  Service: Orthopedics;  Laterality: Left;  . PARTIAL HYSTERECTOMY     ovaries intact  . PARTIAL MASTECTOMY WITH NEEDLE LOCALIZATION AND AXILLARY SENTINEL LYMPH NODE BX Left 05/06/2015   Procedure: LEFT BREAST PARTIAL MASTECTOMY WITH NEEDLE LOCALIZATION AND SENTINEL LYMPH NODE MAPPING;  Surgeon: [name], MD;  Location: [place];  Service: General;  Laterality: Left;  . TONSILLECTOMY    . TUBAL LIGATION      Current Medications: Current Meds  Medication Sig  . acetaminophen (TYLENOL) 500 MG tablet Take 2 tablets (1,000 mg total) by mouth every 6 (six) hours as needed. (Patient taking differently: Take 1,000 mg by mouth every 6 (six) hours as needed (for pain.). )  . amitriptyline (ELAVIL) 50 MG tablet TAKE 1 TO 2 TABLETS BY MOUTH AT BEDTIME AS NEEDED FOR SLEEP (Patient taking differently: Take 50-100 mg by mouth at bedtime as needed for sleep. )  . amLODipine (NORVASC) 5 MG tablet TAKE 1 TABLET BY MOUTH DAILY (Patient taking differently: Take 5 mg by mouth daily at 2 PM. )  . Aspirin-Salicylamide-Caffeine (BC HEADACHE POWDER PO) Take 1 packet by mouth 2 (two) times daily as needed (pain).   . carvedilol (COREG) 6.25 MG tablet Take 1 tablet (6.25 mg total) by mouth 2 (two) times daily.  . diclofenac (VOLTAREN) 75 MG EC tablet Take 1 tablet (75 mg total) by mouth 2 (two) times daily. (Patient taking differently: Take 75 mg by mouth 2 (two) times daily as needed (for pain.). )  . Evolocumab (REPATHA SURECLICK) 831 MG/ML SOAJ Inject 140 mg into the  skin every 14 (fourteen) days.  . fluocinonide cream (LIDEX) 0.05 % APPLY TOPICALLY DAILY AS NEEDED. USE SPARINGLY. (Patient taking differently: Apply 1 application topically daily as needed (for skin rash.). )  . levothyroxine (SYNTHROID, LEVOTHROID) 75 MCG tablet TAKE 1 TABLET BY MOUTH EVERY DAY BEFORE BREAKFAST (Patient taking differently: Take 75 mcg by mouth daily before breakfast. )  . magnesium oxide (MAG-OX) 400 MG tablet TAKE 1 TABLET BY MOUTH DAILY. (Patient taking differently: Take 400 mg by mouth daily. )  . MYRBETRIQ 50 MG TB24 tablet Take 50 mg by mouth every evening.   . nitroGLYCERIN (NITROSTAT) 0.4 MG SL tablet PLACE 1 TABLET (0.4 MG TOTAL) UNDER THE TONGUE EVERY 5 (FIVE) MINUTES AS NEEDED FOR CHEST PAIN.  Marland Kitchen omeprazole (PRILOSEC OTC) 20 MG tablet Take 20 mg by mouth daily at 12  noon.   . orphenadrine (NORFLEX) 100 MG tablet Take 1 tablet (100 mg total) by mouth 2 (two) times daily.  . tamoxifen (NOLVADEX) 20 MG tablet TAKE 1 TABLET (20 MG TOTAL) BY MOUTH DAILY.  . traMADol (ULTRAM) 50 MG tablet TAKE 1 TABLET EVERY 8 HOURS AS NEEDED FOR PAIN (Patient taking differently: Take 50 mg by mouth 2 (two) times daily as needed (for pain). )  . VOLTAREN 1 % GEL APPLY 2-4 GRAMS TO AFFECTED AREA TWICE DAILY (Patient taking differently: Apply 2-4 g topically 4 (four) times daily as needed (for pain .). )     Allergies:   Ace inhibitors; Angiotensin receptor blockers; Statins; Flonase [fluticasone propionate]; Glipizide; Niacin; and Red dye   Social History   Socioeconomic History  . Marital status: Widowed    Spouse name: Not on file  . Number of children: 3  . Years of education: Not on file  . Highest education level: Not on file  Occupational History  . Occupation: retired, previous Engineer, manufacturing systems  Social Needs  . Financial resource strain: Not on file  . Food insecurity:    Worry: Not on file    Inability: Not on file  . Transportation needs:    Medical: Not on file     Non-medical: Not on file  Tobacco Use  . Smoking status: Former Smoker    Last attempt to quit: 04/03/2017    Years since quitting: 1.5  . Smokeless tobacco: Former Systems developer  . Tobacco comment: PT STATES SHE DOES NOT SMOKE.  Substance and Sexual Activity  . Alcohol use: No    Alcohol/week: 0.0 standard drinks    Comment: social  . Drug use: No  . Sexual activity: Never  Lifestyle  . Physical activity:    Days per week: Not on file    Minutes per session: Not on file  . Stress: Not on file  Relationships  . Social connections:    Talks on phone: Not on file    Gets together: Not on file    Attends religious service: Not on file    Active member of club or organization: Not on file    Attends meetings of clubs or organizations: Not on file    Relationship status: Not on file  Other Topics Concern  . Not on file  Social History Narrative   From [place].   3 children, one died from MI in 03/10/05   Married 03-11-1959   Husband and brother died in the same week in 12/10/2012      Patient is right handed.   Patient has hs education.   Patient drinks 1 cup of soda daily, and green tea.     Family History: The patient's family history includes Arthritis in her mother; Cancer in her brother and father; Diabetes in her mother; Heart disease in her son; Stomach cancer in her father. There is no history of Colon cancer, Pancreatic cancer, or Breast cancer.  ROS:   Please see the history of present illness.    All other systems are reviewed and are negative  EKGs/Labs/Other Studies Reviewed:    The following studies were reviewed today: Coronary angiograms  EKG:  EKG is ordered today.  Normal sinus rhythm, nonspecific T wave changes inferior and lateral leads, mostly resembling the appearance of LVH.  Borderline voltage criteria for LVH with R wave of 19 mV in lead II Recent Labs: 07/02/2018: TSH 1.10 07/27/2018: ALT 19 09/11/2018: BUN 13; Creatinine, Ser 0.85; Hemoglobin  13.0; Platelets  341; Potassium 5.0; Sodium 145  Recent Lipid Panel    Component Value Date/Time   CHOL 204 (H) 09/11/2018 1157   TRIG 133 09/11/2018 1157   TRIG 203 10/03/2012   HDL 70 09/11/2018 1157   CHOLHDL 2.9 09/11/2018 1157   CHOLHDL 4 10/27/2017 0756   VLDL 54.0 (H) 10/27/2017 0756   LDLCALC 107 (H) 09/11/2018 1157   LDLDIRECT 154.0 10/27/2017 0756    Physical Exam:    VS:  BP 132/78   Pulse 85   Ht 5\' 3"  (1.6 m)   Wt 172 lb 6.4 oz (78.2 kg)   BMI 30.54 kg/m     Wt Readings from Last 3 Encounters:  10/11/18 172 lb 6.4 oz (78.2 kg)  09/21/18 173 lb (78.5 kg)  09/11/18 175 lb 3.2 oz (79.5 kg)     General: Alert, oriented x3, no distress, no problems at the right radial cath access site Head: no evidence of trauma, PERRL, EOMI, no exophtalmos or lid lag, no myxedema, no xanthelasma; normal ears, nose and oropharynx Neck: normal jugular venous pulsations and no hepatojugular reflux; brisk carotid pulses without delay and no carotid bruits Chest: clear to auscultation, no signs of consolidation by percussion or palpation, normal fremitus, symmetrical and full respiratory excursions Cardiovascular: normal position and quality of the apical impulse, regular rhythm, normal first and second heart sounds, no murmurs, rubs or gallops Abdomen: no tenderness or distention, no masses by palpation, no abnormal pulsatility or arterial bruits, normal bowel sounds, no hepatosplenomegaly Extremities: no clubbing, cyanosis or edema; 2+ radial, ulnar and brachial pulses bilaterally; 2+ right femoral, posterior tibial and dorsalis pedis pulses; 2+ left femoral, posterior tibial and dorsalis pedis pulses; no subclavian or femoral bruits Neurological: grossly nonfocal Psych: Normal mood and affect   ASSESSMENT:    1. Coronary artery disease involving native coronary artery of native heart with other form of angina pectoris ([place])   2. Essential hypertension   3. Dyslipidemia   4. PVCs (premature  ventricular contractions)    PLAN:    In order of problems listed above:  1. CAD: Even if there was no single culprit coronary stenosis on angiography, she has widespread coronary atherosclerosis and will require aggressive treatment of her risk factors.  She is taking a beta-blocker and daily aspirin 81 mg.  Need to find an effective way to bring her LDL cholesterol down to 70 or less 2. HTN: Well-controlled now.  Beta blocker is also helpful with her palpitations 3. HLP: Excellent insurance company coverage for her [place].  To self administer the first dose.  Plan to check lipids again in 3 months after: target LDL cholesterol less than 70. 4. PVC: These are responsible for her symptoms.  Make sure the beta-blocker is always part of her antihypertensive regimen.   Medication Adjustments/Labs and Tests Ordered: Current medicines are reviewed at length with the patient today.  Concerns regarding medicines are outlined above.  Orders Placed This Encounter  Procedures  . Lipid panel  . EKG 12-Lead   No orders of the defined types were placed in this encounter.   Patient Instructions  Medication Instructions:  Dr Sallyanne Kuster recommends that you continue on your current medications as directed. Please refer to the Current Medication list given to you today.  If you need a refill on your cardiac medications before your next appointment, please call your pharmacy.   Lab work: Your physician recommends that you return for lab work in 3 months - FASTING.  If you have labs (blood work) drawn today and your tests are completely normal, you will receive your results only by: Marland Kitchen MyChart Message (if you have MyChart) OR . A paper copy in the mail If you have any lab test that is abnormal or we need to change your treatment, we will call you to review the results.  Follow-Up: At [facility], you and your health needs are our priority.  As part of our continuing mission to provide you with  exceptional heart care, we have created designated Provider Care Teams.  These Care Teams include your primary Cardiologist (physician) and Advanced Practice Providers (APPs -  Physician Assistants and Nurse Practitioners) who all work together to provide you with the care you need, when you need it. You will need a follow up appointment in 12 months.  Please call our office 2 months in advance to schedule this appointment.  You may see Sanda Klein, MD or one of the following Advanced Practice Providers on your designated Care Team: [place], [place] . Fabian Sharp, PA-C    Signed, Sanda Klein, MD  10/11/2018 9:11 AM    [place]

## 2018-10-15 ENCOUNTER — Ambulatory Visit (INDEPENDENT_AMBULATORY_CARE_PROVIDER_SITE_OTHER): Payer: Medicare Other | Admitting: Family Medicine

## 2018-10-15 ENCOUNTER — Encounter: Payer: Self-pay | Admitting: Family Medicine

## 2018-10-15 ENCOUNTER — Telehealth: Payer: Self-pay | Admitting: Family Medicine

## 2018-10-15 VITALS — BP 138/84 | HR 84 | Temp 98.7°F | Ht 63.0 in | Wt 175.0 lb

## 2018-10-15 DIAGNOSIS — R200 Anesthesia of skin: Secondary | ICD-10-CM

## 2018-10-15 DIAGNOSIS — I10 Essential (primary) hypertension: Secondary | ICD-10-CM

## 2018-10-15 DIAGNOSIS — Z0001 Encounter for general adult medical examination with abnormal findings: Secondary | ICD-10-CM | POA: Diagnosis not present

## 2018-10-15 DIAGNOSIS — J0100 Acute maxillary sinusitis, unspecified: Secondary | ICD-10-CM

## 2018-10-15 DIAGNOSIS — Z8639 Personal history of other endocrine, nutritional and metabolic disease: Secondary | ICD-10-CM | POA: Diagnosis not present

## 2018-10-15 DIAGNOSIS — E039 Hypothyroidism, unspecified: Secondary | ICD-10-CM

## 2018-10-15 DIAGNOSIS — E785 Hyperlipidemia, unspecified: Secondary | ICD-10-CM | POA: Diagnosis not present

## 2018-10-15 DIAGNOSIS — Z0000 Encounter for general adult medical examination without abnormal findings: Secondary | ICD-10-CM

## 2018-10-15 DIAGNOSIS — Z853 Personal history of malignant neoplasm of breast: Secondary | ICD-10-CM

## 2018-10-15 DIAGNOSIS — Z23 Encounter for immunization: Secondary | ICD-10-CM

## 2018-10-15 DIAGNOSIS — K760 Fatty (change of) liver, not elsewhere classified: Secondary | ICD-10-CM

## 2018-10-15 MED ORDER — DICLOFENAC SODIUM 75 MG PO TBEC: 75.0000 mg | DELAYED_RELEASE_TABLET | Freq: Two times a day (BID) | ORAL | Status: DC | PRN

## 2018-10-15 MED ORDER — DICLOFENAC SODIUM 1 % TD GEL: 2.0000 g | Freq: Four times a day (QID) | TRANSDERMAL | Status: DC | PRN

## 2018-10-15 MED ORDER — AMITRIPTYLINE HCL 50 MG PO TABS: 50.0000 mg | ORAL_TABLET | Freq: Every evening | ORAL | Status: DC | PRN

## 2018-10-15 MED ORDER — AMLODIPINE BESYLATE 5 MG PO TABS: 5.0000 mg | ORAL_TABLET | Freq: Every day | ORAL | Status: DC

## 2018-10-15 MED ORDER — ACETAMINOPHEN 500 MG PO TABS: 1000.0000 mg | ORAL_TABLET | Freq: Three times a day (TID) | ORAL | Status: DC | PRN

## 2018-10-15 MED ORDER — MAGNESIUM OXIDE 400 MG PO TABS: 400.0000 mg | ORAL_TABLET | Freq: Every day | ORAL | Status: DC

## 2018-10-15 MED ORDER — AZITHROMYCIN 250 MG PO TABS: ORAL_TABLET | ORAL | 0 refills | Status: DC

## 2018-10-15 MED ORDER — LEVOTHYROXINE SODIUM 75 MCG PO TABS: 75.0000 ug | ORAL_TABLET | Freq: Every day | ORAL | 3 refills | Status: DC

## 2018-10-15 MED ORDER — TRAMADOL HCL 50 MG PO TABS: 50.0000 mg | ORAL_TABLET | Freq: Two times a day (BID) | ORAL | Status: DC | PRN

## 2018-10-15 MED ORDER — FLUOCINONIDE 0.05 % EX CREA: 1.0000 "application " | TOPICAL_CREAM | Freq: Every day | CUTANEOUS | Status: DC | PRN

## 2018-10-15 NOTE — Progress Notes (Signed)
I have personally reviewed the Medicare Annual Wellness questionnaire and have noted 1. The patient's medical and social history 2. Their use of alcohol, tobacco or illicit drugs 3. Their current medications and supplements 4. The patient's functional ability including ADL's, fall risks, home safety risks and hearing or visual             impairment. 5. Diet and physical activities 6. Evidence for depression or mood disorders  The patients weight, height, BMI have been recorded in the chart and visual acuity is per eye clinic.  I have made referrals, counseling and provided education to the patient based review of the above and I have provided the pt with a written personalized care plan for preventive services.  Provider list updated- see scanned forms.  Routine anticipatory guidance given to patient.  See health maintenance. The possibility exists that previously documented standard health maintenance information may have been brought forward from a previous encounter into this note.  If needed, that same information has been updated to reflect the current situation based on today's encounter.    Flu 2019 Shingles out of stock PNA up-to-date Tetanus 2008, repeat tetanus shot may be cheaper at the pharmacy. Colonoscopy 2014.  She got a letter about follow-up.  Discussed. Breast cancer screening-2019 Bone density test 2015 Advance directive-daughter [name] designated if patient were incapacitated Cognitive function addressed- see scanned forms- and if abnormal then additional documentation follows.   Hypertension:    Using medication without problems or lightheadedness:  yes Chest pain with exertion:no Edema: some BLE edema at baseline.   Short of breath:no  She isn't diabetic by A1c.  She isn't on meds for DM2.  Discussed.  Continue work on diet and exercise.  Elevated Cholesterol: Using medications without problems: yes Muscle aches: no Diet compliance: encouraged.    Exercise: encouraged She started repatha with first dose recently.  She didn't have ADE on med but she didn't want to give herself the shot.  She has help with that.  She is statin intolerant.  Last lipid panel was prior to repatha start.   Hypothyroidism.  TSH recently wnl.  No neck mass, no persistent lumps in the neck.   Labs d/w pt.    Sinuses ttp for the last 2 weeks.  Sinuses ttp on exam.  No fevers. Some rhinorrhea.    PMH and SH reviewed  Meds, vitals, and allergies reviewed.   ROS: Per HPI.  Unless specifically indicated otherwise in HPI, the patient denies:  General: fever. Eyes: acute vision changes ENT: sore throat Cardiovascular: chest pain Respiratory: SOB GI: vomiting GU: dysuria Musculoskeletal: acute back pain Derm: acute rash Neuro: acute motor dysfunction Psych: worsening mood Endocrine: polydipsia Heme: bleeding Allergy: hayfever  GEN: nad, alert and oriented HEENT: mucous membranes moist, TM wnl B, canal wnl B, sinuses tender palpation x4 NECK: supple w/o LA CV: rrr. PULM: ctab, no inc wob ABD: soft, +bs EXT: L>R ankle edema at baseline.  SKIN: no acute rash

## 2018-10-15 NOTE — Telephone Encounter (Signed)
Left message asking pt to call office regarding referral

## 2018-10-15 NOTE — Patient Instructions (Signed)
Start zithromax.   Update me as needed.   Take care.  Glad to see you.

## 2018-10-19 NOTE — Assessment & Plan Note (Signed)
She isn't diabetic by A1c.  She isn't on meds for DM2.  Discussed.  Continue work on diet and exercise.

## 2018-10-19 NOTE — Assessment & Plan Note (Signed)
Flu 2019 Shingles out of stock PNA up-to-date Tetanus 2008, repeat tetanus shot may be cheaper at the pharmacy. Colonoscopy 2014.  She got a letter about follow-up.  Discussed. Breast cancer screening-2019 Bone density test 2015 Advance directive-daughter [name] designated if patient were incapacitated Cognitive function addressed- see scanned forms- and if abnormal then additional documentation follows.

## 2018-10-19 NOTE — Assessment & Plan Note (Signed)
Per oncology clinic.  I will defer.  I appreciate the help of all involved.

## 2018-10-19 NOTE — Assessment & Plan Note (Signed)
TSH recently wnl.  No neck mass, no persistent lumps in the neck.   Labs d/w pt.

## 2018-10-19 NOTE — Assessment & Plan Note (Signed)
Continue work on diet and exercise and lipid control.

## 2018-10-19 NOTE — Assessment & Plan Note (Signed)
She started repatha with first dose recently.  She didn't have ADE on med but she didn't want to give herself the shot.  She has help with that.  She is statin intolerant.  Last lipid panel was prior to repatha start.  Continue as is for now.  She agrees.

## 2018-10-19 NOTE — Assessment & Plan Note (Signed)
Nontoxic.  Okay for outpatient follow-up.  Start Zithromax.  Update me as needed.  She agrees.

## 2018-10-19 NOTE — Assessment & Plan Note (Signed)
Reasonable control.  No change in meds.  Continue work on diet and exercise.  She agrees.

## 2018-11-13 DIAGNOSIS — H26492 Other secondary cataract, left eye: Secondary | ICD-10-CM | POA: Diagnosis not present

## 2018-11-18 ENCOUNTER — Other Ambulatory Visit: Payer: Self-pay | Admitting: Family Medicine

## 2018-12-01 ENCOUNTER — Other Ambulatory Visit: Payer: Self-pay | Admitting: Family Medicine

## 2018-12-03 NOTE — Telephone Encounter (Signed)
Electronic refill request. Fluocinonide Cream Last office visit:   10/15/18 CPE Last Filled:   10/15/2018  30 g with 3 RF Please advise.

## 2018-12-04 ENCOUNTER — Other Ambulatory Visit: Payer: Self-pay | Admitting: Family Medicine

## 2018-12-04 NOTE — Telephone Encounter (Signed)
Sister answered the phone and Kelly Kennedy asked that I leave a message with her and I did so.

## 2018-12-04 NOTE — Telephone Encounter (Signed)
Electronic refill request. Tramadol Last office visit:   10/15/18 CPE Last Filled:   #30   With 2 RF on 12/10/2017 Please advise.

## 2018-12-04 NOTE — Telephone Encounter (Signed)
Sent.  If she is not improving with this then I want to recheck her in the clinic.  Thanks.

## 2018-12-05 ENCOUNTER — Other Ambulatory Visit: Payer: Self-pay | Admitting: Family Medicine

## 2018-12-05 NOTE — Telephone Encounter (Signed)
Electronic refill request Amitriptyline Last office visit 10/15/18 Last refill 12/08/17 #180/3

## 2018-12-05 NOTE — Telephone Encounter (Signed)
Sent. Thanks.   

## 2018-12-24 ENCOUNTER — Ambulatory Visit: Payer: Medicare Other | Admitting: Neurology

## 2018-12-24 ENCOUNTER — Encounter: Payer: Self-pay | Admitting: Neurology

## 2018-12-24 ENCOUNTER — Telehealth: Payer: Self-pay | Admitting: Neurology

## 2018-12-24 VITALS — BP 170/89 | HR 76 | Ht 63.0 in | Wt 175.4 lb

## 2018-12-24 DIAGNOSIS — G44221 Chronic tension-type headache, intractable: Secondary | ICD-10-CM | POA: Diagnosis not present

## 2018-12-24 DIAGNOSIS — G44321 Chronic post-traumatic headache, intractable: Secondary | ICD-10-CM | POA: Diagnosis not present

## 2018-12-24 MED ORDER — TOPIRAMATE 25 MG PO TABS: 25.0000 mg | ORAL_TABLET | Freq: Two times a day (BID) | ORAL | 3 refills | Status: DC

## 2018-12-24 NOTE — Telephone Encounter (Signed)
[employer] medicare order sent to [facility]. No auth they will reach out to the pt to schedule.  °

## 2018-12-24 NOTE — Telephone Encounter (Signed)
lvm for pt to be aware I left [facility] phone number of [phone] if she has heard from them in the next 2-3 business days to give them a call.

## 2018-12-24 NOTE — Patient Instructions (Signed)
I had a long discussion with the patient regarding her chronic intractable posttraumatic headaches which have mixed tension and vascular features.   I recommend she discontinue BC powder and tramadol as there may also be a component of analgesic rebound. start Topamax 25 mg twice daily for headache prophylaxis and increase the dose of amitriptyline to 100 mg at night.  I encouraged her to do regular neck stretching exercises and use application of heat packs to her neck muscles when sore.  She was encouraged to increase participation in regular activities for stress relaxation like exercise, swimming, meditation and yoga.  Check MRI scan of the brain with and without contrast to look for structural lesions as well as EEG to evaluate for electrical irritability given her episode of loss of consciousness.  She will return for follow-up in 2 months or call earlier if necessary.  Neck Exercises Neck exercises can be important for many reasons:  They can help you to improve and maintain flexibility in your neck. This can be especially important as you age.  They can help to make your neck stronger. This can make movement easier.  They can reduce or prevent neck pain.  They may help your upper back. Ask your health care provider which neck exercises would be best for you. Exercises to improve neck flexibility Neck stretch Repeat this exercise 3-5 times. 1. Do this exercise while standing or while sitting in a chair. 2. Place your feet flat on the floor, shoulder-width apart. 3. Slowly turn your head to the right. Turn it all the way to the right so you can look over your right shoulder. Do not tilt or tip your head. 4. Hold this position for 10-30 seconds. 5. Slowly turn your head to the left, to look over your left shoulder. 6. Hold this position for 10-30 seconds.  Neck retraction Repeat this exercise 8-10 times. Do this 3-4 times a day or as told by your health care provider. 1. Do this  exercise while standing or while sitting in a sturdy chair. 2. Look straight ahead. Do not bend your neck. 3. Use your fingers to push your chin backward. Do not bend your neck for this movement. Continue to face straight ahead. If you are doing the exercise properly, you will feel a slight sensation in your throat and a stretch at the back of your neck. 4. Hold the stretch for 1-2 seconds. Relax and repeat. Exercises to improve neck strength Neck press Repeat this exercise 10 times. Do it first thing in the morning and right before bed or as told by your health care provider. 1. Lie on your back on a firm bed or on the floor with a pillow under your head. 2. Use your neck muscles to push your head down on the pillow and straighten your spine. 3. Hold the position as well as you can. Keep your head facing up and your chin tucked. 4. Slowly count to 5 while holding this position. 5. Relax for a few seconds. Then repeat. Isometric strengthening Do a full set of these exercises 2 times a day or as told by your health care provider. 1. Sit in a supportive chair and place your hand on your forehead. 2. Push forward with your head and neck while pushing back with your hand. Hold for 10 seconds. 3. Relax. Then repeat the exercise 3 times. 4. Next, do thesequence again, this time putting your hand against the back of your head. Use your head and neck  to push backward against the hand pressure. 5. Finally, do the same exercise on either side of your head, pushing sideways against the pressure of your hand. Prone head lifts Repeat this exercise 5 times. Do this 2 times a day or as told by your health care provider. 1. Lie face-down, resting on your elbows so that your chest and upper back are raised. 2. Start with your head facing downward, near your chest. Position your chin either on or near your chest. 3. Slowly lift your head upward. Lift until you are looking straight ahead. Then continue lifting  your head as far back as you can stretch. 4. Hold your head up for 5 seconds. Then slowly lower it to your starting position. Supine head lifts Repeat this exercise 8-10 times. Do this 2 times a day or as told by your health care provider. 1. Lie on your back, bending your knees to point to the ceiling and keeping your feet flat on the floor. 2. Lift your head slowly off the floor, raising your chin toward your chest. 3. Hold for 5 seconds. 4. Relax and repeat. Scapular retraction Repeat this exercise 5 times. Do this 2 times a day or as told by your health care provider. 1. Stand with your arms at your sides. Look straight ahead. 2. Slowly pull both shoulders backward and downward until you feel a stretch between your shoulder blades in your upper back. 3. Hold for 10-30 seconds. 4. Relax and repeat. Contact a health care provider if:  Your neck pain or discomfort gets much worse when you do an exercise.  Your neck pain or discomfort does not improve within 2 hours after you exercise. If you have any of these problems, stop exercising right away. Do not do the exercises again unless your health care provider says that you can. Get help right away if:  You develop sudden, severe neck pain. If this happens, stop exercising right away. Do not do the exercises again unless your health care provider says that you can. This information is not intended to replace advice given to you by your health care provider. Make sure you discuss any questions you have with your health care provider. Document Released: 10/26/2015 Document Revised: 03/20/2018 Document Reviewed: 05/25/2015 Elsevier Interactive Patient Education  2019 [employer].
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
## 2019-01-04 ENCOUNTER — Ambulatory Visit
Admission: RE | Admit: 2019-01-04 | Discharge: 2019-01-04 | Disposition: A | Discharge status: Home / Self Care | Payer: Medicare Other | Source: Ambulatory Visit | Attending: Neurology | Admitting: Neurology

## 2019-01-04 DIAGNOSIS — G44221 Chronic tension-type headache, intractable: Secondary | ICD-10-CM

## 2019-01-04 DIAGNOSIS — R51 Headache: Secondary | ICD-10-CM | POA: Diagnosis not present

## 2019-01-04 MED ORDER — GADOBENATE DIMEGLUMINE 529 MG/ML IV SOLN
15.0000 mL | Freq: Once | INTRAVENOUS | Status: AC | PRN
  Administered 2019-01-04: 15 mL via INTRAVENOUS

## 2019-01-07 ENCOUNTER — Telehealth: Payer: Self-pay

## 2019-01-07 NOTE — Telephone Encounter (Signed)
-----   Message from Garvin Fila, MD sent at 01/07/2019  7:22 AM EST ----- Kindly inform patient that MRI scan of brain shows mild age related changes of hardening of arteries which are slightly progressed since 2015 MRI.Marland Kitchen No new or unexpected findings

## 2019-01-07 NOTE — Telephone Encounter (Signed)
I called patient that the MRI brain shows age related changes and hardening of the arteries slightly progressed since 2015 MRI. NO new or worrisome findings. Pt verbalized understanding. ------

## 2019-01-16 ENCOUNTER — Ambulatory Visit: Payer: Medicare Other

## 2019-01-16 DIAGNOSIS — G44321 Chronic post-traumatic headache, intractable: Secondary | ICD-10-CM

## 2019-01-16 DIAGNOSIS — R2990 Unspecified symptoms and signs involving the nervous system: Secondary | ICD-10-CM | POA: Diagnosis not present

## 2019-01-16 DIAGNOSIS — G44221 Chronic tension-type headache, intractable: Secondary | ICD-10-CM

## 2019-01-24 ENCOUNTER — Telehealth: Payer: Self-pay

## 2019-01-24 NOTE — Telephone Encounter (Signed)
-----   Message from Garvin Fila, MD sent at 01/23/2019  4:49 PM EST ----- Kindly inform the patient that EEG study was normal.

## 2019-01-24 NOTE — Telephone Encounter (Signed)
Notes recorded by Marval Regal, RN on 01/24/2019 at 11:31 AM EST I called patient that the EEG was normal. Pt verbalized understanding. ------

## 2019-01-28 ENCOUNTER — Telehealth: Payer: Self-pay

## 2019-01-28 DIAGNOSIS — E785 Hyperlipidemia, unspecified: Secondary | ICD-10-CM

## 2019-01-28 NOTE — Telephone Encounter (Signed)
Called pt to let them know that they need to come in for labs for a repatha pa for lipids and appt is not needed but do need to be fasting

## 2019-02-01 DIAGNOSIS — E785 Hyperlipidemia, unspecified: Secondary | ICD-10-CM | POA: Diagnosis not present

## 2019-02-01 LAB — LIPID PANEL
CHOL/HDL RATIO: 1.7 ratio (ref 0.0–4.4)
Cholesterol, Total: 132 mg/dL (ref 100–199)
HDL: 79 mg/dL (ref >39)
LDL Calculated: 35 mg/dL (ref 0–99)
Triglycerides: 92 mg/dL (ref 0–149)
VLDL Cholesterol Cal: 18 mg/dL (ref 5–40)

## 2019-02-07 ENCOUNTER — Other Ambulatory Visit: Payer: Self-pay | Admitting: Family Medicine

## 2019-02-07 DIAGNOSIS — Z1231 Encounter for screening mammogram for malignant neoplasm of breast: Secondary | ICD-10-CM

## 2019-03-07 ENCOUNTER — Telehealth: Payer: Self-pay

## 2019-03-07 ENCOUNTER — Ambulatory Visit: Payer: Medicare Other

## 2019-03-07 ENCOUNTER — Other Ambulatory Visit: Payer: Self-pay | Admitting: Family Medicine

## 2019-03-07 NOTE — Telephone Encounter (Signed)
Left detail vm that due to COVID 19 we are doing video visit to minimize risk to pts and health care providers.We are not doing in office visits at this time.LEft vm that we need to know if she has a email address or computer or phone that has a camera. Need consent from pt.

## 2019-03-16 ENCOUNTER — Other Ambulatory Visit: Payer: Self-pay | Admitting: Oncology

## 2019-03-18 ENCOUNTER — Ambulatory Visit (INDEPENDENT_AMBULATORY_CARE_PROVIDER_SITE_OTHER): Payer: Medicare Other | Admitting: Neurology

## 2019-03-18 ENCOUNTER — Telehealth: Payer: Self-pay | Admitting: Neurology

## 2019-03-18 ENCOUNTER — Other Ambulatory Visit: Payer: Self-pay

## 2019-03-18 DIAGNOSIS — G44209 Tension-type headache, unspecified, not intractable: Secondary | ICD-10-CM

## 2019-03-18 MED ORDER — DIVALPROEX SODIUM ER 500 MG PO TB24: 500.0000 mg | ORAL_TABLET | Freq: Every day | ORAL | 1 refills | Status: DC
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
## 2019-03-18 NOTE — Telephone Encounter (Signed)
I called pt back that she gave consent for telephone visit last week. Pt thought she had to get dress and come to the office. I explain when I call last week we are not seeing pts in office due to COVID 19. Pt stated she forgot. I explain Dr. Leonie Man will call her this morning. Pt verbalized understanding.

## 2019-03-18 NOTE — Telephone Encounter (Signed)
Patient stated she was not aware she had an apt so early this morning, Would like to be resch for a later time. Best call back [phone]

## 2019-04-12 ENCOUNTER — Other Ambulatory Visit: Payer: Self-pay

## 2019-04-12 ENCOUNTER — Ambulatory Visit
Admission: RE | Admit: 2019-04-12 | Discharge: 2019-04-12 | Disposition: A | Discharge status: Home / Self Care | Payer: Medicare Other | Source: Ambulatory Visit | Attending: Family Medicine | Admitting: Family Medicine

## 2019-04-12 DIAGNOSIS — Z1231 Encounter for screening mammogram for malignant neoplasm of breast: Secondary | ICD-10-CM | POA: Diagnosis not present

## 2019-04-17 ENCOUNTER — Other Ambulatory Visit: Payer: Self-pay | Admitting: Family Medicine

## 2019-04-17 ENCOUNTER — Other Ambulatory Visit: Payer: Self-pay | Admitting: Neurology

## 2019-04-17 DIAGNOSIS — R928 Other abnormal and inconclusive findings on diagnostic imaging of breast: Secondary | ICD-10-CM

## 2019-04-18 ENCOUNTER — Ambulatory Visit: Payer: Medicare Other

## 2019-04-18 ENCOUNTER — Other Ambulatory Visit: Payer: Self-pay

## 2019-04-18 MED ORDER — DIVALPROEX SODIUM ER 500 MG PO TB24: 500.0000 mg | ORAL_TABLET | Freq: Every day | ORAL | 3 refills | Status: DC

## 2019-04-23 ENCOUNTER — Other Ambulatory Visit: Payer: Self-pay

## 2019-04-23 ENCOUNTER — Ambulatory Visit
Admission: RE | Admit: 2019-04-23 | Discharge: 2019-04-23 | Disposition: A | Discharge status: Home / Self Care | Payer: Medicare Other | Source: Ambulatory Visit | Attending: Family Medicine | Admitting: Family Medicine

## 2019-04-23 DIAGNOSIS — R928 Other abnormal and inconclusive findings on diagnostic imaging of breast: Secondary | ICD-10-CM

## 2019-06-10 ENCOUNTER — Other Ambulatory Visit: Payer: Self-pay | Admitting: Family Medicine

## 2019-06-19 ENCOUNTER — Encounter: Payer: Self-pay | Admitting: Adult Health

## 2019-06-19 ENCOUNTER — Other Ambulatory Visit: Payer: Self-pay

## 2019-06-19 ENCOUNTER — Ambulatory Visit (INDEPENDENT_AMBULATORY_CARE_PROVIDER_SITE_OTHER): Payer: Medicare Other | Admitting: Adult Health

## 2019-06-19 VITALS — BP 182/90 | HR 87 | Temp 95.3°F | Ht 63.0 in | Wt 175.0 lb

## 2019-06-19 DIAGNOSIS — G44321 Chronic post-traumatic headache, intractable: Secondary | ICD-10-CM

## 2019-06-19 DIAGNOSIS — G44209 Tension-type headache, unspecified, not intractable: Secondary | ICD-10-CM | POA: Diagnosis not present
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
## 2019-06-19 NOTE — Patient Instructions (Signed)
Your Plan:  Restart depakote 500mg  nightly for headaches. Let us know if you experience any type of side effects and ensure you stop medication and notify office  Continue amitriptyline 100mg  night  Follow up in 3 months    Thank you for coming to see [place] at [facility]. I hope we have been able to provide you high quality care today.  You may receive a patient satisfaction survey over the next few weeks. We would appreciate your feedback and comments so that we may continue to improve ourselves and the health of our patients.

## 2019-06-19 NOTE — Progress Notes (Signed)
I agree with the above plan 

## 2019-06-23 ENCOUNTER — Telehealth: Payer: Self-pay | Admitting: Family Medicine

## 2019-06-23 NOTE — Telephone Encounter (Signed)
Noted. Thanks.

## 2019-06-23 NOTE — Telephone Encounter (Signed)
-----   Message from Josetta Huddle, [place] sent at 06/20/2019 11:34 AM EDT ----- Patient states that since Cardiology changed her medicine, her BP has not been under as good control but it is usually not as high as it was at Neurology.  Patient advised to let us know if BP stays elevated. ----- Message ----- From: [name], MD Sent: 06/19/2019   5:12 PM EDT To: Josetta Huddle, CMA  Patient's blood pressure was high at neurology office visit.  Please have her recheck her blood pressure at home and let me know how it  is going.  If her blood pressure elevation was an isolated event then we do not have to do anything else about it.  Thanks.  Brigitte Pulse

## 2019-07-04 ENCOUNTER — Other Ambulatory Visit: Payer: Self-pay

## 2019-07-04 ENCOUNTER — Encounter: Payer: Self-pay | Admitting: Family Medicine

## 2019-07-04 ENCOUNTER — Ambulatory Visit (INDEPENDENT_AMBULATORY_CARE_PROVIDER_SITE_OTHER): Payer: Medicare Other | Admitting: Family Medicine

## 2019-07-04 VITALS — BP 174/96 | HR 89 | Temp 98.6°F | Ht 63.0 in | Wt 174.1 lb

## 2019-07-04 DIAGNOSIS — R350 Frequency of micturition: Secondary | ICD-10-CM | POA: Diagnosis not present

## 2019-07-04 DIAGNOSIS — I10 Essential (primary) hypertension: Secondary | ICD-10-CM | POA: Diagnosis not present

## 2019-07-04 DIAGNOSIS — R3121 Asymptomatic microscopic hematuria: Secondary | ICD-10-CM | POA: Diagnosis not present

## 2019-07-04 NOTE — Progress Notes (Signed)
Subjective:    Patient ID: Elisabella Earleen Newport, female    DOB: 1941/07/09, 78 y.o.   MRN: [id number]  HPI  78 yo pt of Dr Damita Dunnings here for BP concerns  Has headache 3 days In front /above eyes    Wt Readings from Last 3 Encounters:  07/04/19 174 lb 2 oz (79 kg)  06/19/19 175 lb (77 kg)  12/24/18 175 lb 6.4 oz (77 kg)  stable  30.84 kg/m   No exercise due to her headaches and need to stay in during the pandemic  She likes to walk when weather permits    Hypertension :  BP Readings from Last 3 Encounters:  07/04/19 (!) 174/96  06/19/19 (!) 182/90  12/24/18 (!) 170/89  re check today 170/90 sitting in R arm Her home readings - 140s/60s on average   H/o of tension headaches Takes depakote and amitriptyline  Sees neurology   This HA feels different   Not anxious    High cholesterol on repatha Lab Results  Component Value Date   CHOL 132 02/01/2019   HDL 79 02/01/2019   LDLCALC 35 02/01/2019   LDLDIRECT 154.0 10/27/2017   TRIG 92 02/01/2019   CHOLHDL 1.7 02/01/2019   ? H/o TIA in the past   Lab Results  Component Value Date   TSH 1.10 07/02/2018     Takes carvedilol 6.25 mg bid-new from her cardiologist Amlodipine 5 mg daily -told to stop it by cardiology  (upon rev of cardiology notes from nov 2019 however I to not see inst to stop amlodipine and it is still on her list)  Pulse Readings from Last 3 Encounters:  07/04/19 89  06/19/19 87  12/24/18 76     nsaid-diclofenac  Tamoxifen   H/o heart M  Lab Results  Component Value Date   CREATININE 0.85 09/11/2018   BUN 13 09/11/2018   NA 145 (H) 09/11/2018   K 5.0 09/11/2018   CL 105 09/11/2018   CO2 25 09/11/2018   Lab Results  Component Value Date   TSH 1.10 07/02/2018    Patient Active Problem List   Diagnosis Date Noted  . PVCs (premature ventricular contractions) 10/11/2018  . Abnormal cardiac CT angiography 09/20/2018  . Coronary artery disease 09/12/2018  . Concussion wth  loss of consciousness of 30 minutes or less 08/08/2018  . Pulmonary nodule 08/08/2018  . Fatty liver 08/05/2018  . Liver hemangioma 08/05/2018  . Irritation of oral cavity 09/22/2017  . Skin lesion 04/20/2017  . Abnormal prominence of clavicle 03/24/2017  . SUI (stress urinary incontinence, female) 12/20/2016  . Neoplasm of left breast, primary tumor staging category Tis: ductal carcinoma in situ (DCIS) 08/06/2015  . Malignant neoplasm of upper-outer quadrant of left breast in female, estrogen receptor positive ([place]) 06/10/2015  . History of breast cancer 05/14/2015  . Acute non-recurrent maxillary sinusitis 02/08/2015  . Left sided numbness 12/11/2014  . HLD (hyperlipidemia) 12/11/2014  . History of diabetes mellitus 12/11/2014  . Vaginitis and vulvovaginitis 10/10/2014  . Chest pain with moderate risk of acute coronary syndrome 09/28/2014  . Essential hypertension 09/28/2014  . ? History of TIA (transient ischemic attack) 09/28/2014  . Osteopenia 07/09/2014  . Advance care planning 07/06/2014  . Obesity, unspecified 07/03/2013  . Anxiety state, unspecified 07/03/2013  . Atypical chest pain 07/03/2013  . Medicare annual wellness visit, subsequent 05/27/2012  . MENOPAUSAL SYNDROME 06/15/2010  . Insomnia 06/15/2010  . GERD 09/16/2008  . Hypothyroidism 08/02/2007  . DEPENDENT EDEMA  08/02/2007  . SYSTOLIC MURMUR 03/54/6568  . ANGINA, HX OF 07/17/2007  . HIATAL HERNIA 06/28/2005  . DIVERTICULOSIS, COLON W/O HEM 06/28/2005   Past Medical History:  Diagnosis Date  . adenomatous Colon polyps    colonoscopy 8/99, 12/02, 8/06  . Arthritis   . Bell's palsy   . Cancer [facility])    DCIS left breast  . Chest pain    uses NTG as needed  . Depression   . Diabetes mellitus without complication ([place])   . Diverticulosis of colon 06/2005  . Ductal carcinoma in situ (DCIS) of left breast   . Family history of adverse reaction to anesthesia    " my mother didn't wake up so they had to put her  on life support for about an hour or more; my sisiter has problems waking up too from anesthesia.  Marland Kitchen GERD (gastroesophageal reflux disease)   . H/O hiatal hernia   . Headache(784.0)   . Hyperglycemia   . Hyperlipidemia   . Hypertension   . Hypothyroid   . Insomnia   . Irritable bowel   . Menopausal symptoms   . Pericarditis 1980's  . Stroke ([place])   . TIA (transient ischemic attack)    09/2014   Past Surgical History:  Procedure Laterality Date  . APPENDECTOMY    . BREAST EXCISIONAL BIOPSY Right    benign  . BREAST RECONSTRUCTION WITH PLACEMENT OF TISSUE EXPANDER AND FLEX HD (ACELLULAR HYDRATED DERMIS) Left 08/06/2015   Procedure: LEFT BREAST RECONSTRUCTION WITH PLACEMENT OF SALINE IMPLANT AND ACELLULAR DURMAL MATRIX;  Surgeon: Crissie Reese, MD;  Location: [place];  Service: Plastics;  Laterality: Left;  . CARDIAC CATHETERIZATION    . CATARACT EXTRACTION Bilateral 2016  . CHOLECYSTECTOMY  1990  . CYSTECTOMY  05/04/08   left lower arm, benign  . ECTOPIC PREGNANCY SURGERY  1971  . FRACTURE SURGERY     nose  . LEFT HEART CATH AND CORONARY ANGIOGRAPHY [name] 09/21/2018   Procedure: LEFT HEART CATH AND CORONARY ANGIOGRAPHY;  Surgeon: Leonie Man, MD;  Location: [place] [facility];  Service: Cardiovascular;  Laterality: [name];  . MASTECTOMY Left   . NIPPLE SPARING MASTECTOMY Left 08/06/2015   Procedure: LEFT NIPPLE SPARING MASTECTOMY;  Surgeon: [name], MD;  Location: [place];  Service: General;  Laterality: Left;  . ORIF ANKLE FRACTURE Left 10/23/2013   Procedure: OPEN REDUCTION INTERNAL FIXATION (ORIF) LEFT ANKLE FRACTURE;  Surgeon: Marianna Payment, MD;  Location: [place];  Service: Orthopedics;  Laterality: Left;  . PARTIAL HYSTERECTOMY     ovaries intact  . PARTIAL MASTECTOMY WITH NEEDLE LOCALIZATION AND AXILLARY SENTINEL LYMPH NODE BX Left 05/06/2015   Procedure: LEFT BREAST PARTIAL MASTECTOMY WITH NEEDLE LOCALIZATION AND SENTINEL LYMPH NODE MAPPING;  Surgeon: [name], MD;   Location: [place];  Service: General;  Laterality: Left;  . TONSILLECTOMY    . TUBAL LIGATION     Social History   Tobacco Use  . Smoking status: Former Smoker    Quit date: 04/03/2017    Years since quitting: 2.2  . Smokeless tobacco: Former Systems developer  . Tobacco comment: PT STATES SHE DOES NOT SMOKE.  Substance Use Topics  . Alcohol use: No    Alcohol/week: 0.0 standard drinks    Comment: social  . Drug use: No   Family History  Problem Relation Age of Onset  . Cancer Father        stomach   . Stomach cancer Father   .  Heart disease Son        MI  . Arthritis Mother   . Diabetes Mother   . Cancer Brother        lung cancer  . Colon cancer Neg Hx   . Pancreatic cancer Neg Hx   . Breast cancer Neg Hx    Allergies  Allergen Reactions  . Ace Inhibitors Swelling    H/o lip swelling with change in losartan pill  . Angiotensin Receptor Blockers Swelling    H/o lip swelling with change in losartan pill  . Statins Anaphylaxis    myalgias myalgias  . Flonase [Fluticasone Propionate] Other (See Comments)    Intolerant of smell  . Glipizide Other (See Comments)    unstready when on med.    . Niacin Rash  . Red Dye Rash   Current Outpatient Medications on File Prior to Visit  Medication Sig Dispense Refill  . amitriptyline (ELAVIL) 50 MG tablet TAKE 1 TO 2 TABLETS BY MOUTH AT BEDTIME AS NEEDED FOR SLEEP 180 tablet 3  . amLODipine (NORVASC) 5 MG tablet Take 1 tablet (5 mg total) by mouth daily at 2 PM.    . Aspirin-Salicylamide-Caffeine (BC HEADACHE POWDER PO) Take 1 packet by mouth 2 (two) times daily as needed (pain).     . carvedilol (COREG) 6.25 MG tablet Take 1 tablet (6.25 mg total) by mouth 2 (two) times daily. 180 tablet 3  . diclofenac (VOLTAREN) 75 MG EC tablet Take 1 tablet (75 mg total) by mouth 2 (two) times daily as needed (for pain.).    Marland Kitchen diclofenac sodium (VOLTAREN) 1 % GEL Apply 2-4 g topically 4 (four) times daily as needed (for pain .).    Marland Kitchen  divalproex (DEPAKOTE ER) 500 MG 24 hr tablet Take 1 tablet (500 mg total) by mouth daily. 30 tablet 3  . Evolocumab (REPATHA SURECLICK) 222 MG/ML SOAJ Inject 140 mg into the skin every 14 (fourteen) days. 2 pen 12  . fluocinonide cream (LIDEX) 0.05 % Apply topically daily as needed.  Use sparingly. 30 g 3  . levothyroxine (SYNTHROID, LEVOTHROID) 75 MCG tablet Take 1 tablet (75 mcg total) by mouth daily before breakfast. 90 tablet 3  . magnesium oxide (MAG-OX) 400 MG tablet TAKE 1 TABLET BY MOUTH EVERY DAY 90 tablet 1  . MYRBETRIQ 50 MG TB24 tablet Take 50 mg by mouth every evening.   1  . nitroGLYCERIN (NITROSTAT) 0.4 MG SL tablet PLACE 1 TABLET (0.4 MG TOTAL) UNDER THE TONGUE EVERY 5 (FIVE) MINUTES AS NEEDED FOR CHEST PAIN. 25 tablet 1  . omeprazole (PRILOSEC OTC) 20 MG tablet Take 20 mg by mouth daily at 12 noon.     . orphenadrine (NORFLEX) 100 MG tablet Take 1 tablet (100 mg total) by mouth 2 (two) times daily. 30 tablet 0  . tamoxifen (NOLVADEX) 20 MG tablet TAKE 1 TABLET (20 MG TOTAL) BY MOUTH DAILY. 90 tablet 1  . LUMIGAN 0.01 % SOLN Place 1 drop into both eyes at bedtime.     No current facility-administered medications on file prior to visit.     Review of Systems  Constitutional: Negative for activity change, appetite change, fatigue, fever and unexpected weight change.  HENT: Negative for congestion, ear pain, rhinorrhea, sinus pressure and sore throat.   Eyes: Negative for pain, redness and visual disturbance.  Respiratory: Negative for cough, chest tightness, shortness of breath and wheezing.   Cardiovascular: Negative for chest pain, palpitations and leg swelling.  Gastrointestinal: Negative for  abdominal pain, blood in stool, constipation and diarrhea.  Endocrine: Negative for polydipsia and polyuria.  Genitourinary: Negative for dysuria, frequency and urgency.  Musculoskeletal: Negative for arthralgias, back pain and myalgias.  Skin: Negative for pallor and rash.   Allergic/Immunologic: Negative for environmental allergies.  Neurological: Positive for headaches. Negative for dizziness and syncope.  Hematological: Negative for adenopathy. Does not bruise/bleed easily.  Psychiatric/Behavioral: Negative for decreased concentration and dysphoric mood. The patient is not nervous/anxious.        Objective:   Physical Exam Constitutional:      General: She is not in acute distress.    Appearance: Normal appearance. She is well-developed. She is obese. She is not ill-appearing or diaphoretic.  HENT:     Head: Normocephalic and atraumatic.     Mouth/Throat:     Mouth: Mucous membranes are moist.  Eyes:     General: No scleral icterus.    Conjunctiva/sclera: Conjunctivae normal.     Pupils: Pupils are equal, round, and reactive to light.  Neck:     Musculoskeletal: Normal range of motion and neck supple. No neck rigidity or muscular tenderness.     Thyroid: No thyromegaly.     Vascular: No carotid bruit or JVD.  Cardiovascular:     Rate and Rhythm: Normal rate and regular rhythm.     Pulses: Normal pulses.     Heart sounds: Murmur present. No gallop.   Pulmonary:     Effort: Pulmonary effort is normal. No respiratory distress.     Breath sounds: Normal breath sounds. No wheezing or rales.  Abdominal:     General: Bowel sounds are normal. There is no distension or abdominal bruit.     Palpations: Abdomen is soft. There is no mass.     Tenderness: There is no abdominal tenderness.     Hernia: No hernia is present.  Musculoskeletal:     Right lower leg: No edema.     Left lower leg: No edema.  Lymphadenopathy:     Cervical: No cervical adenopathy.  Skin:    General: Skin is warm and dry.     Findings: No rash.  Neurological:     Mental Status: She is alert.     Cranial Nerves: No cranial nerve deficit.     Motor: No tremor.     Coordination: Coordination normal.     Deep Tendon Reflexes: Reflexes are normal and symmetric. Reflexes normal.   Psychiatric:        Mood and Affect: Mood normal.        Cognition and Memory: Cognition and memory normal.     Comments: Pleasant  Good mood Not anxious            Assessment & Plan:   Problem List Items Addressed This Visit      Cardiovascular and Mediastinum   Essential hypertension - Primary    Per pt- bp has been going up ever since her cardiologist stopped amlodipine and replaced it with carvedilol for CAD BP: (!) 174/96    Lower at home/needs to have her cuff checked Headaches are worse as well   Taking repatha for cholesterol  Needs to start exercising  Today-re started amlodipine 5 mg to take with her carvediolol (actually it was never taken off med list to begin with )  Alert [place] if side eff or bp under 90/50  Enc healthy diet/ DASH handout given  Last labs removed Planned f/u with pcp in 2-3 weeks inst to call  earlier if problems or questions Enc her to bring cuff to her next appt

## 2019-07-04 NOTE — Patient Instructions (Signed)
Get back on amlodipine 5 mg daily  Continue the carvedilol  If any side effects or problems let us know   Start walking again early in am if you feel well enough  Drink water Avoid extra salt   Follow up with Dr Damita Dunnings in 2-3 weeks and bring your cuff with you so he can test it

## 2019-07-04 NOTE — Assessment & Plan Note (Signed)
Per pt- bp has been going up ever since her cardiologist stopped amlodipine and replaced it with carvedilol for CAD BP: (!) 174/96    Lower at home/needs to have her cuff checked Headaches are worse as well   Taking repatha for cholesterol  Needs to start exercising  Today-re started amlodipine 5 mg to take with her carvediolol (actually it was never taken off med list to begin with )  Alert [place] if side eff or bp under 90/50  Enc healthy diet/ DASH handout given  Last labs removed Planned f/u with pcp in 2-3 weeks inst to call earlier if problems or questions Enc her to bring cuff to her next appt

## 2019-07-08 ENCOUNTER — Inpatient Hospital Stay: Payer: Medicare Other | Attending: Oncology | Admitting: Oncology

## 2019-07-08 ENCOUNTER — Other Ambulatory Visit: Payer: Self-pay

## 2019-07-08 VITALS — BP 151/69 | HR 88 | Temp 98.9°F | Resp 16 | Ht 63.0 in | Wt 172.0 lb

## 2019-07-08 DIAGNOSIS — G4700 Insomnia, unspecified: Secondary | ICD-10-CM | POA: Insufficient documentation

## 2019-07-08 DIAGNOSIS — Z79899 Other long term (current) drug therapy: Secondary | ICD-10-CM | POA: Insufficient documentation

## 2019-07-08 DIAGNOSIS — E1165 Type 2 diabetes mellitus with hyperglycemia: Secondary | ICD-10-CM | POA: Insufficient documentation

## 2019-07-08 DIAGNOSIS — Z87891 Personal history of nicotine dependence: Secondary | ICD-10-CM | POA: Insufficient documentation

## 2019-07-08 DIAGNOSIS — Z79810 Long term (current) use of selective estrogen receptor modulators (SERMs): Secondary | ICD-10-CM | POA: Diagnosis not present

## 2019-07-08 DIAGNOSIS — E785 Hyperlipidemia, unspecified: Secondary | ICD-10-CM | POA: Diagnosis not present

## 2019-07-08 DIAGNOSIS — Z170 Estrogen receptor positive status [ER+]: Secondary | ICD-10-CM

## 2019-07-08 DIAGNOSIS — D0512 Intraductal carcinoma in situ of left breast: Secondary | ICD-10-CM | POA: Insufficient documentation

## 2019-07-08 DIAGNOSIS — I10 Essential (primary) hypertension: Secondary | ICD-10-CM | POA: Insufficient documentation

## 2019-07-08 DIAGNOSIS — Z800 Family history of malignant neoplasm of digestive organs: Secondary | ICD-10-CM | POA: Insufficient documentation

## 2019-07-08 DIAGNOSIS — F329 Major depressive disorder, single episode, unspecified: Secondary | ICD-10-CM | POA: Diagnosis not present

## 2019-07-08 DIAGNOSIS — Z7982 Long term (current) use of aspirin: Secondary | ICD-10-CM | POA: Diagnosis not present

## 2019-07-08 DIAGNOSIS — Z8673 Personal history of transient ischemic attack (TIA), and cerebral infarction without residual deficits: Secondary | ICD-10-CM | POA: Insufficient documentation

## 2019-07-08 DIAGNOSIS — C50412 Malignant neoplasm of upper-outer quadrant of left female breast: Secondary | ICD-10-CM | POA: Diagnosis not present

## 2019-07-08 DIAGNOSIS — Z9012 Acquired absence of left breast and nipple: Secondary | ICD-10-CM | POA: Insufficient documentation

## 2019-07-08 DIAGNOSIS — E039 Hypothyroidism, unspecified: Secondary | ICD-10-CM | POA: Insufficient documentation

## 2019-07-08 MED ORDER — METRONIDAZOLE 1 % EX GEL: Freq: Every day | CUTANEOUS | 0 refills | Status: DC

## 2019-07-08 NOTE — Progress Notes (Signed)
[name]  [phone]) [phone] [phone]) [phone]     ID: Kelly Kennedy DOB: 12/03/1940  MR#: [id number]  DPO#:[id number]  Patient Care Team: [name], MD as PCP - General Croitoru, Dani Gobble, MD as PCP - Cardiology (Cardiology) Sylvan Cheese, NP as Nurse Practitioner (Hematology and Oncology) Ashutosh Dieguez, Virgie Dad, MD as Consulting Physician (Oncology) [name], MD as Consulting Physician (General Surgery) Crissie Reese, MD as Consulting Physician (Plastic Surgery) SU: [name] MD OTHER MD:   CHIEF COMPLAINT: Ductal carcinoma in situ  CURRENT TREATMENT: tamoxifen    BREAST CANCER HISTORY: From the original intake note:  "Kelly Kennedy" had routine screening mammography with tomography at the breast Center for 20 08/18/2015 showing possible new calcifications in the left breast. Left diagnostic mammography on 04/02/2015 showed pleomorphic calcifications measuring up to 8 mm in the upper outer quadrant. Biopsy of this area on 04/07/2015 showed ([id number]) ductal carcinoma in situ, low to intermediate grade, estrogen receptor 97% positive, progesterone receptor 97% positive, both with strong staining intensity.  A second group of calcifications, more anterior, also in the upper outer quadrant, was biopsied 04/22/2015 showing ([id number]) ductal carcinoma in situ, grade 2, estrogen receptor 100% positive, progesterone receptor 98% positive, both with strong staining intensity.  On 05/06/2015 the patient underwent left partial mastectomy which showed intermediate grade ductal carcinoma in situ extending over 7.9 cm. Both sentinel lymph nodes were clear. Margins were less than a millimeter anteriorly and posteriorly as well as laterally.  Given the many exceedingly close margins and the cosmetic defect already present, the patient has opted for left mastectomy with immediate reconstruction.   Her subsequent history is as detailed  below.   INTERVAL HISTORY: Kelly Kennedy returns today for follow-up and treatment of her ductal carcinoma in situ.   She continues on tamoxifen. She notes that she has some breaking out on her face and genitale areas. She was temporarily off of tamoxifen for 2 months as one of her doctors was trying to determine the cause; she notes that her acne improved. She notes that she prefers to stay on Tamoxifen rather than switching to something else.   Since her last visit here, she underwent a digital screening unilateral right mammogram with tomography on 04/12/2019 showing: Breast Density Category C. In the right breast, a possible mass warrants further evaluation.   She then underwent a digital diagnostic right mammogram with tomography and an ultrasound of the right breast on the same day showing: Breast Density Category B. There is a superficially positioned rounded mass that may be associated with the skin in the lower inner quadrant of the right breast. On physical exam, there is a healed reduction scar in the inferior right breast with focal palpable thickening in the 5 o'clock region 8 cm from the nipple associated with the scar. Patient states that this focal thickening of the scar is stable. There is a visible pore on the skin overlying this palpable area. Targeted ultrasound is performed, showing an oval cyst with some internal debris, positioned within the thickened dermis of the scar. This cutaneous cyst measures 0.7 x 0.5 x 0.6 cm. There is no internal vascular flow. The surrounding breast parenchyma is normal on ultrasound.  In short the mass seen on mammography is a benign appearing cutaneous cyst, most likely a sebaceous or epidermal inclusion cyst.  Screening mammography of the right breast was recommended again in 1 more year.   REVIEW OF SYSTEMS: Kelly Kennedy usually likes to walk for exercise.  The patient denies unusual headaches, visual changes, nausea, vomiting, or dizziness. There has  been no unusual cough, phlegm production, or pleurisy. This been no change in bowel or bladder habits. The patient denies unexplained fatigue or unexplained weight loss, bleeding, or fever. A detailed review of systems was otherwise noncontributory.    PAST MEDICAL HISTORY: Past Medical History:  Diagnosis Date  . adenomatous Colon polyps    colonoscopy 8/99, 12/02, 8/06  . Arthritis   . Bell's palsy   . Cancer [facility])    DCIS left breast  . Chest pain    uses NTG as needed  . Depression   . Diabetes mellitus without complication ([place])   . Diverticulosis of colon 06/2005  . Ductal carcinoma in situ (DCIS) of left breast   . Family history of adverse reaction to anesthesia    " my mother didn't wake up so they had to put her on life support for about an hour or more; my sisiter has problems waking up too from anesthesia.  Marland Kitchen GERD (gastroesophageal reflux disease)   . H/O hiatal hernia   . Headache(784.0)   . Hyperglycemia   . Hyperlipidemia   . Hypertension   . Hypothyroid   . Insomnia   . Irritable bowel   . Menopausal symptoms   . Pericarditis 1980's  . Stroke ([place])   . TIA (transient ischemic attack)    09/2014    PAST SURGICAL HISTORY: Past Surgical History:  Procedure Laterality Date  . APPENDECTOMY    . BREAST EXCISIONAL BIOPSY Right    benign  . BREAST RECONSTRUCTION WITH PLACEMENT OF TISSUE EXPANDER AND FLEX HD (ACELLULAR HYDRATED DERMIS) Left 08/06/2015   Procedure: LEFT BREAST RECONSTRUCTION WITH PLACEMENT OF SALINE IMPLANT AND ACELLULAR DURMAL MATRIX;  Surgeon: Crissie Reese, MD;  Location: [place];  Service: Plastics;  Laterality: Left;  . CARDIAC CATHETERIZATION    . CATARACT EXTRACTION Bilateral 2016  . CHOLECYSTECTOMY  1990  . CYSTECTOMY  05/04/08   left lower arm, benign  . ECTOPIC PREGNANCY SURGERY  1971  . FRACTURE SURGERY     nose  . LEFT HEART CATH AND CORONARY ANGIOGRAPHY [name] 09/21/2018   Procedure: LEFT HEART CATH AND CORONARY ANGIOGRAPHY;  Surgeon:  Leonie Man, MD;  Location: [facility];  Service: Cardiovascular;  Laterality: [name];  . MASTECTOMY Left   . NIPPLE SPARING MASTECTOMY Left 08/06/2015   Procedure: LEFT NIPPLE SPARING MASTECTOMY;  Surgeon: [name], MD;  Location: [place];  Service: General;  Laterality: Left;  . ORIF ANKLE FRACTURE Left 10/23/2013   Procedure: OPEN REDUCTION INTERNAL FIXATION (ORIF) LEFT ANKLE FRACTURE;  Surgeon: Marianna Payment, MD;  Location: [facility];  Service: Orthopedics;  Laterality: Left;  . PARTIAL HYSTERECTOMY     ovaries intact  . PARTIAL MASTECTOMY WITH NEEDLE LOCALIZATION AND AXILLARY SENTINEL LYMPH NODE BX Left 05/06/2015   Procedure: LEFT BREAST PARTIAL MASTECTOMY WITH NEEDLE LOCALIZATION AND SENTINEL LYMPH NODE MAPPING;  Surgeon: [name], MD;  Location: [place];  Service: General;  Laterality: Left;  . TONSILLECTOMY    . TUBAL LIGATION      FAMILY HISTORY Family History  Problem Relation Age of Onset  . Cancer Father        stomach   . Stomach cancer Father   . Heart disease Son        MI  . Arthritis Mother   . Diabetes Mother   . Cancer Brother  lung cancer  . Colon cancer Neg Hx   . Pancreatic cancer Neg Hx   . Breast cancer Neg Hx    the patient's father died from stomach cancer at age 61. The patient's mother died from gangrene at the age of 60. The patient has one sister, 9 brothers. Her sister had breast cancer diagnosed after the age of 45. There is no history of ovarian cancer in the family.  GYNECOLOGIC HISTORY:  No LMP recorded. Patient has had a hysterectomy. Menarche age 12, first live birth age 15, the patient is GX P3. She underwent hysterectomy more than 20 years ago. She does believe her ovaries were removed at that time. She was on hormone replacement until earlier this year.  SOCIAL HISTORY:  She used to work in a [employer] but is now retired. She is widowed--her husband died Christmas Day some years ago. At home she  lives with her sister [name]. One of the patient's sons died from a massive heart attack while playing basketball at age 83.. The patient's daughter [name] is currently unemployed. The patient has 4 grandchildren and at least 26 great-grandchildren. She attends a [employer] locally    ADVANCED DIRECTIVES: Not in place   HEALTH MAINTENANCE: Social History   Tobacco Use  . Smoking status: Former Smoker    Quit date: 04/03/2017    Years since quitting: 2.2  . Smokeless tobacco: Former Systems developer  . Tobacco comment: PT STATES SHE DOES NOT SMOKE.  Substance Use Topics  . Alcohol use: No    Alcohol/week: 0.0 standard drinks    Comment: social  . Drug use: No     Colonoscopy: 09/09/2010, 09/26/2013  PAP:  Bone density: 2013?/Normal per patient report  Lipid panel:  Allergies  Allergen Reactions  . Ace Inhibitors Swelling    H/o lip swelling with change in losartan pill  . Angiotensin Receptor Blockers Swelling    H/o lip swelling with change in losartan pill  . Statins Anaphylaxis    myalgias myalgias  . Flonase [Fluticasone Propionate] Other (See Comments)    Intolerant of smell  . Glipizide Other (See Comments)    unstready when on med.    . Niacin Rash  . Red Dye Rash    Current Outpatient Medications  Medication Sig Dispense Refill  . amitriptyline (ELAVIL) 50 MG tablet TAKE 1 TO 2 TABLETS BY MOUTH AT BEDTIME AS NEEDED FOR SLEEP 180 tablet 3  . amLODipine (NORVASC) 5 MG tablet Take 1 tablet (5 mg total) by mouth daily at 2 PM.    . Aspirin-Salicylamide-Caffeine (BC HEADACHE POWDER PO) Take 1 packet by mouth 2 (two) times daily as needed (pain).     . carvedilol (COREG) 6.25 MG tablet Take 1 tablet (6.25 mg total) by mouth 2 (two) times daily. 180 tablet 3  . diclofenac (VOLTAREN) 75 MG EC tablet Take 1 tablet (75 mg total) by mouth 2 (two) times daily as needed (for pain.).    Marland Kitchen diclofenac sodium (VOLTAREN) 1 % GEL Apply 2-4 g topically 4 (four) times daily as needed (for  pain .).    Marland Kitchen divalproex (DEPAKOTE ER) 500 MG 24 hr tablet Take 1 tablet (500 mg total) by mouth daily. 30 tablet 3  . Evolocumab (REPATHA SURECLICK) 630 MG/ML SOAJ Inject 140 mg into the skin every 14 (fourteen) days. 2 pen 12  . fluocinonide cream (LIDEX) 0.05 % Apply topically daily as needed.  Use sparingly. 30 g 3  . levothyroxine (SYNTHROID, LEVOTHROID) 75  MCG tablet Take 1 tablet (75 mcg total) by mouth daily before breakfast. 90 tablet 3  . LUMIGAN 0.01 % SOLN Place 1 drop into both eyes at bedtime.    . magnesium oxide (MAG-OX) 400 MG tablet TAKE 1 TABLET BY MOUTH EVERY DAY 90 tablet 1  . metroNIDAZOLE (METROGEL) 1 % gel Apply topically daily. 45 g 0  . MYRBETRIQ 50 MG TB24 tablet Take 50 mg by mouth every evening.   1  . nitroGLYCERIN (NITROSTAT) 0.4 MG SL tablet PLACE 1 TABLET (0.4 MG TOTAL) UNDER THE TONGUE EVERY 5 (FIVE) MINUTES AS NEEDED FOR CHEST PAIN. 25 tablet 1  . omeprazole (PRILOSEC OTC) 20 MG tablet Take 20 mg by mouth daily at 12 noon.     . orphenadrine (NORFLEX) 100 MG tablet Take 1 tablet (100 mg total) by mouth 2 (two) times daily. 30 tablet 0  . tamoxifen (NOLVADEX) 20 MG tablet TAKE 1 TABLET (20 MG TOTAL) BY MOUTH DAILY. 90 tablet 1   No current facility-administered medications for this visit.     OBJECTIVE: Middle-aged African-American woman who appears stated age  78:   07/08/19 1405  BP: (!) 151/69  Pulse: 88  Resp: 16  Temp: 98.9 F (37.2 C)  SpO2: 98%     Body mass index is 30.47 kg/m.    ECOG FS:0 - Asymptomatic  Sclerae unicteric, EOMs intact Wearing a mask No cervical or supraclavicular adenopathy Lungs no rales or rhonchi Heart regular rate and rhythm Abd soft, nontender, positive bowel sounds MSK no focal spinal tenderness, no upper extremity lymphedema Neuro: nonfocal, well oriented, appropriate affect Breasts: The right breast is status post reduction mammoplasty.  It is otherwise unremarkable.  The left breast is status post  mastectomy with implant reconstruction.  There is no evidence of disease recurrence.  Both axillae are benign.  LAB RESULTS:  CMP     Component Value Date/Time   NA 145 (H) 09/11/2018 1157   NA 141 06/10/2015 1549   K 5.0 09/11/2018 1157   K 3.8 06/10/2015 1549   CL 105 09/11/2018 1157   CO2 25 09/11/2018 1157   CO2 26 06/10/2015 1549   GLUCOSE 89 09/11/2018 1157   GLUCOSE 117 (H) 07/27/2018 1306   GLUCOSE 111 06/10/2015 1549   GLUCOSE 113 (H) 12/11/2006 1253   BUN 13 09/11/2018 1157   BUN 16.6 06/10/2015 1549   CREATININE 0.85 09/11/2018 1157   CREATININE 1.3 (H) 06/10/2015 1549   CALCIUM 10.2 09/11/2018 1157   CALCIUM 9.3 06/10/2015 1549   PROT 6.4 (L) 07/27/2018 1258   PROT 7.0 06/10/2015 1549   ALBUMIN 3.6 07/27/2018 1258   ALBUMIN 3.7 06/10/2015 1549   AST 40 07/27/2018 1258   AST 30 06/10/2015 1549   ALT 19 07/27/2018 1258   ALT 31 06/10/2015 1549   ALKPHOS 50 07/27/2018 1258   ALKPHOS 66 06/10/2015 1549   BILITOT 0.6 07/27/2018 1258   BILITOT 0.30 06/10/2015 1549   GFRNONAA 67 09/11/2018 1157   GFRAA 77 09/11/2018 1157    INo results found for: SPEP, UPEP  Lab Results  Component Value Date   WBC 7.1 09/11/2018   NEUTROABS 2.5 07/06/2018   HGB 13.0 09/11/2018   HCT 39.7 09/11/2018   MCV 88 09/11/2018   PLT 341 09/11/2018      Chemistry      Component Value Date/Time   NA 145 (H) 09/11/2018 1157   NA 141 06/10/2015 1549   K 5.0 09/11/2018 1157  K 3.8 06/10/2015 1549   CL 105 09/11/2018 1157   CO2 25 09/11/2018 1157   CO2 26 06/10/2015 1549   BUN 13 09/11/2018 1157   BUN 16.6 06/10/2015 1549   CREATININE 0.85 09/11/2018 1157   CREATININE 1.3 (H) 06/10/2015 1549      Component Value Date/Time   CALCIUM 10.2 09/11/2018 1157   CALCIUM 9.3 06/10/2015 1549   ALKPHOS 50 07/27/2018 1258   ALKPHOS 66 06/10/2015 1549   AST 40 07/27/2018 1258   AST 30 06/10/2015 1549   ALT 19 07/27/2018 1258   ALT 31 06/10/2015 1549   BILITOT 0.6 07/27/2018 1258    BILITOT 0.30 06/10/2015 1549       No results found for: LABCA2  No components found for: LABCA125  No results for input(s): INR in the last 168 hours.  Urinalysis    Component Value Date/Time   COLORURINE YELLOW 11/10/2015 0332   APPEARANCEUR TURBID (A) 11/10/2015 0332   LABSPEC 1.019 11/10/2015 0332   PHURINE 5.5 11/10/2015 0332   GLUCOSEU NEGATIVE 11/10/2015 0332   HGBUR NEGATIVE 11/10/2015 0332   HGBUR negative 07/16/2010 1521   BILIRUBINUR neg 03/10/2016 1447   KETONESUR 15 (A) 11/10/2015 0332   PROTEINUR neg 03/10/2016 1447   PROTEINUR NEGATIVE 11/10/2015 0332   UROBILINOGEN negative 03/10/2016 1447   UROBILINOGEN 0.2 09/29/2014 0645   NITRITE neg 03/10/2016 1447   NITRITE NEGATIVE 11/10/2015 0332   LEUKOCYTESUR Negative 03/10/2016 1447    STUDIES: No results found.   ASSESSMENT: 78 y.o. [place] woman status post left lumpectomy and sentinel lymph node sampling 05/06/2015 for extensive ductal carcinoma in situ, grade 2, estrogen and progesterone receptor strongly positive, with both sentinel lymph nodes negative, but exceedingly close margins  (1) left nipple sparing mastectomy with immediate implant reconstruction on 08/06/2015  (2) tamoxifen for breast cancer prevention started 09/29/2015  PLAN: Kelly Kennedy is now a little over 4 years out from definitive surgery for her breast cancer with no evidence of disease recurrence.  This is very favorable.  She tolerates tamoxifen well.  She thinks tamoxifen may be related to her rosacea and possibly to her vaginal irritation.  She does say those problems got better when she was off the medication.  I offered to switch her to an aromatase inhibitor but she is very reluctant.  Accordingly we are continuing the tamoxifen.  She only has 1 more year to go.  At the next visit she will probably "graduate".  In the meantime I did put in a prescription for MetroGel for her to use on her face.  She will let me know if that  causes any problems or if it does not work  Otherwise she will see me again in 1 year  She is keeping appropriate pandemic precautions. Hildegard Hlavac, Virgie Dad, MD  07/08/19 2:29 PM Medical Oncology and Hematology [facility] [address] [place], [place] [address] Tel. [phone]    Fax. [phone]  I, Jacqualyn Posey am acting as a Education administrator for Chauncey Cruel, MD.   I, Lurline Del MD, have reviewed the above documentation for accuracy and completeness, and I agree with the above.

## 2019-07-09 ENCOUNTER — Telehealth: Payer: Self-pay | Admitting: Oncology

## 2019-07-09 NOTE — Telephone Encounter (Signed)
I left a message regarding schedule  

## 2019-07-15 DIAGNOSIS — N2889 Other specified disorders of kidney and ureter: Secondary | ICD-10-CM | POA: Diagnosis not present

## 2019-07-15 DIAGNOSIS — R3121 Asymptomatic microscopic hematuria: Secondary | ICD-10-CM | POA: Diagnosis not present

## 2019-07-15 DIAGNOSIS — N281 Cyst of kidney, acquired: Secondary | ICD-10-CM | POA: Diagnosis not present

## 2019-07-15 DIAGNOSIS — N200 Calculus of kidney: Secondary | ICD-10-CM | POA: Diagnosis not present

## 2019-07-17 ENCOUNTER — Other Ambulatory Visit: Payer: Self-pay | Admitting: Neurology

## 2019-07-18 DIAGNOSIS — R3121 Asymptomatic microscopic hematuria: Secondary | ICD-10-CM | POA: Diagnosis not present

## 2019-07-20 ENCOUNTER — Other Ambulatory Visit: Payer: Self-pay | Admitting: Cardiovascular Disease

## 2019-07-25 ENCOUNTER — Encounter: Payer: Self-pay | Admitting: Family Medicine

## 2019-07-25 ENCOUNTER — Other Ambulatory Visit: Payer: Self-pay

## 2019-07-25 ENCOUNTER — Ambulatory Visit (INDEPENDENT_AMBULATORY_CARE_PROVIDER_SITE_OTHER): Payer: Medicare Other | Admitting: Family Medicine

## 2019-07-25 VITALS — BP 151/78 | HR 89 | Temp 98.0°F | Ht 63.0 in | Wt 169.4 lb

## 2019-07-25 DIAGNOSIS — R918 Other nonspecific abnormal finding of lung field: Secondary | ICD-10-CM | POA: Diagnosis not present

## 2019-07-25 DIAGNOSIS — Z23 Encounter for immunization: Secondary | ICD-10-CM

## 2019-07-25 DIAGNOSIS — I10 Essential (primary) hypertension: Secondary | ICD-10-CM | POA: Diagnosis not present

## 2019-07-25 NOTE — Patient Instructions (Addendum)
We'll call about the CT scan.  Prop up your ankles and let me know if the swelling doesn't get better.   If your have trouble getting lightheaded, then cut the amlodipine in half and update me.  Take care.  Glad to see you.  Schedule a yearly visit around 10/2019.

## 2019-07-25 NOTE — Progress Notes (Signed)
D/w pt about f/u CT scan re: lung nodule.  Ordered.  No cough, no sputum.  No fevers.    Hypertension:    Using medication without problems or lightheadedness: yes Chest pain with exertion:no Edema: some occ intermittent BLE edema.  See avs.  Short of breath:no.   HA are a lot better.  She can walk up to an hour (when not having to go to mult MD visits, d/w pt).    Flu shot today.  Meds, vitals, and allergies reviewed.   ROS: Per HPI unless specifically indicated in ROS section   GEN: nad, alert and oriented HEENT: ncat NECK: supple w/o LA CV: rrr PULM: ctab, no inc wob ABD: soft, +bs EXT: trace BLE edema SKIN: no acute rash

## 2019-07-28 DIAGNOSIS — R918 Other nonspecific abnormal finding of lung field: Secondary | ICD-10-CM | POA: Insufficient documentation

## 2019-07-28 NOTE — Assessment & Plan Note (Signed)
Follow-up CT chest regarding lung nodule pending.

## 2019-07-28 NOTE — Assessment & Plan Note (Signed)
Reasonable control.  Continue amlodipine as is.  If she notes increase in swelling or if she is lightheaded then I want her to update me.  We can cut back on the dose if needed.  She agrees.

## 2019-08-02 ENCOUNTER — Ambulatory Visit (HOSPITAL_COMMUNITY)
Admission: RE | Admit: 2019-08-02 | Discharge: 2019-08-02 | Disposition: A | Discharge status: Home / Self Care | Payer: Medicare Other | Source: Ambulatory Visit | Attending: Family Medicine | Admitting: Family Medicine

## 2019-08-02 ENCOUNTER — Other Ambulatory Visit: Payer: Self-pay

## 2019-08-02 ENCOUNTER — Encounter (HOSPITAL_COMMUNITY): Payer: Self-pay

## 2019-08-02 DIAGNOSIS — R918 Other nonspecific abnormal finding of lung field: Secondary | ICD-10-CM | POA: Insufficient documentation

## 2019-08-02 DIAGNOSIS — R911 Solitary pulmonary nodule: Secondary | ICD-10-CM | POA: Diagnosis not present

## 2019-08-06 ENCOUNTER — Telehealth: Payer: Self-pay

## 2019-08-06 ENCOUNTER — Encounter: Payer: Self-pay | Admitting: Gastroenterology

## 2019-08-06 NOTE — Telephone Encounter (Signed)
Agreed with ER dispo, I will await the ER notes.  She has normal speech at last OV with me, she was oriented.

## 2019-08-06 NOTE — Telephone Encounter (Signed)
Kelly Kennedy (DPR signed) left v/m wanting to know who prescribed Divalproex 500 mg; what med is used for and why pt needs to take med. Kelly Kennedy thinks Divalproex caused slurred speech,dragging feet,sleepiness,loss of appetite and disorientation. Pt last saw Dr Damita Dunnings on 07/25/19 . Dr Roxan Hockey in neurology prescribed Divalproex on med list. I spoke with [facility]; pts daughter stopped Divalproex on 08/03/19; Per Kelly Kennedy pt seems better with symptoms but still dragging both feet and slurred speech, sleeping a lot,still some disorientation. Advised Dr Leonie Man prescribed med but pt needs to go to ED for eval for possible stroke. Pt said she will go to [facility] now. Kelly Kennedy said she can take pt and does not need 911 called. FYI to Dr Damita Dunnings.

## 2019-08-06 NOTE — Telephone Encounter (Signed)
Patient's daughter called and stated that she had some side effects of taking divalproex (DEPAKOTE ER) 500 MG 24 hr tablet. She stated that her mother experienced some slurred speech, drop in temperature and loss of appetite. She has since has stop taking the medication the patient's daughter would like to know does her mother need to be seen in the office or go to the hospital. She did mention that side effects have since subsided. Please advise.

## 2019-08-07 ENCOUNTER — Emergency Department (HOSPITAL_COMMUNITY): Payer: Medicare Other

## 2019-08-07 ENCOUNTER — Emergency Department (HOSPITAL_COMMUNITY)
Admission: EM | Admit: 2019-08-07 | Discharge: 2019-08-07 | Disposition: A | Discharge status: Home / Self Care | Payer: Medicare Other | Attending: Emergency Medicine | Admitting: Emergency Medicine

## 2019-08-07 ENCOUNTER — Other Ambulatory Visit: Payer: Self-pay

## 2019-08-07 ENCOUNTER — Encounter (HOSPITAL_COMMUNITY): Payer: Self-pay

## 2019-08-07 DIAGNOSIS — R29818 Other symptoms and signs involving the nervous system: Secondary | ICD-10-CM | POA: Diagnosis not present

## 2019-08-07 DIAGNOSIS — R202 Paresthesia of skin: Secondary | ICD-10-CM | POA: Insufficient documentation

## 2019-08-07 DIAGNOSIS — R200 Anesthesia of skin: Secondary | ICD-10-CM

## 2019-08-07 DIAGNOSIS — I10 Essential (primary) hypertension: Secondary | ICD-10-CM | POA: Insufficient documentation

## 2019-08-07 DIAGNOSIS — E119 Type 2 diabetes mellitus without complications: Secondary | ICD-10-CM | POA: Insufficient documentation

## 2019-08-07 DIAGNOSIS — E039 Hypothyroidism, unspecified: Secondary | ICD-10-CM | POA: Diagnosis not present

## 2019-08-07 DIAGNOSIS — R0789 Other chest pain: Secondary | ICD-10-CM | POA: Diagnosis not present

## 2019-08-07 DIAGNOSIS — Z79899 Other long term (current) drug therapy: Secondary | ICD-10-CM | POA: Insufficient documentation

## 2019-08-07 DIAGNOSIS — I2510 Atherosclerotic heart disease of native coronary artery without angina pectoris: Secondary | ICD-10-CM | POA: Diagnosis not present

## 2019-08-07 DIAGNOSIS — Z20828 Contact with and (suspected) exposure to other viral communicable diseases: Secondary | ICD-10-CM | POA: Diagnosis not present

## 2019-08-07 DIAGNOSIS — R079 Chest pain, unspecified: Secondary | ICD-10-CM | POA: Diagnosis not present

## 2019-08-07 DIAGNOSIS — Z87891 Personal history of nicotine dependence: Secondary | ICD-10-CM | POA: Diagnosis not present

## 2019-08-07 LAB — CBC WITH DIFFERENTIAL/PLATELET
Abs Immature Granulocytes: 0.04 10*3/uL (ref 0.00–0.07)
Basophils Absolute: 0 10*3/uL (ref 0.0–0.1)
Basophils Relative: 0 %
Eosinophils Absolute: 0 10*3/uL (ref 0.0–0.5)
Eosinophils Relative: 0 %
HCT: 40.3 % (ref 36.0–46.0)
Hemoglobin: 13.1 g/dL (ref 12.0–15.0)
Immature Granulocytes: 1 %
Lymphocytes Relative: 32 %
Lymphs Abs: 2.4 10*3/uL (ref 0.7–4.0)
MCH: 29.8 pg (ref 26.0–34.0)
MCHC: 32.5 g/dL (ref 30.0–36.0)
MCV: 91.8 fL (ref 80.0–100.0)
Monocytes Absolute: 0.6 10*3/uL (ref 0.1–1.0)
Monocytes Relative: 7 %
Neutro Abs: 4.6 10*3/uL (ref 1.7–7.7)
Neutrophils Relative %: 60 %
Platelets: 310 10*3/uL (ref 150–400)
RBC: 4.39 MIL/uL (ref 3.87–5.11)
RDW: 15.3 % (ref 11.5–15.5)
WBC: 7.7 10*3/uL (ref 4.0–10.5)
nRBC: 0 % (ref 0.0–0.2)

## 2019-08-07 LAB — COMPREHENSIVE METABOLIC PANEL
ALT: 20 U/L (ref 0–44)
AST: 29 U/L (ref 15–41)
Albumin: 4.1 g/dL (ref 3.5–5.0)
Alkaline Phosphatase: 49 U/L (ref 38–126)
Anion gap: 9 (ref 5–15)
BUN: 11 mg/dL (ref 8–23)
CO2: 23 mmol/L (ref 22–32)
Calcium: 9.1 mg/dL (ref 8.9–10.3)
Chloride: 109 mmol/L (ref 98–111)
Creatinine, Ser: 0.9 mg/dL (ref 0.44–1.00)
GFR calc Af Amer: >60 mL/min (ref >60)
GFR calc non Af Amer: >60 mL/min (ref >60)
Glucose, Bld: 123 mg/dL — ABNORMAL HIGH (ref 70–99)
Potassium: 3.4 mmol/L — ABNORMAL LOW (ref 3.5–5.1)
Sodium: 141 mmol/L (ref 135–145)
Total Bilirubin: 0.3 mg/dL (ref 0.3–1.2)
Total Protein: 7.6 g/dL (ref 6.5–8.1)

## 2019-08-07 LAB — APTT: aPTT: 30 seconds (ref 24–36)

## 2019-08-07 LAB — URINALYSIS, ROUTINE W REFLEX MICROSCOPIC
Bilirubin Urine: NEGATIVE
Glucose, UA: NEGATIVE mg/dL
Hgb urine dipstick: NEGATIVE
Ketones, ur: 5 mg/dL — AB
Leukocytes,Ua: NEGATIVE
Nitrite: NEGATIVE
Protein, ur: NEGATIVE mg/dL
Specific Gravity, Urine: 1.014 (ref 1.005–1.030)
pH: 5 (ref 5.0–8.0)

## 2019-08-07 LAB — PROTIME-INR
INR: 1.1 (ref 0.8–1.2)
Prothrombin Time: 14.2 seconds (ref 11.4–15.2)

## 2019-08-07 LAB — VALPROIC ACID LEVEL: Valproic Acid Lvl: <10 ug/mL — ABNORMAL LOW (ref 50.0–100.0)

## 2019-08-07 LAB — SARS CORONAVIRUS 2 BY RT PCR (HOSPITAL ORDER, PERFORMED IN ~~LOC~~ HOSPITAL LAB): SARS Coronavirus 2: NEGATIVE

## 2019-08-07 LAB — TROPONIN I (HIGH SENSITIVITY)
Troponin I (High Sensitivity): 2 ng/L (ref <18)
Troponin I (High Sensitivity): 3 ng/L (ref <18)

## 2019-08-07 LAB — MAGNESIUM: Magnesium: 2.4 mg/dL (ref 1.7–2.4)

## 2019-08-07 NOTE — ED Triage Notes (Signed)
Pt c/o intermittent R leg numbness and pain, intermittent R face numbness/tingling, intermittent R lower back pain, and intermittent RLQ abdominal pain starting yesterday.  Pain score 8/10.  Hx of Bell's palsy on L side of face.  PCP directed Pt to ED.  Pt had no issues walking to triage.   Pt currently c/o R low back pain shooting down R leg.

## 2019-08-07 NOTE — Telephone Encounter (Signed)
She was last seen in office on 06/19/2019.  Recommended an initial visit with Dr. Leonie Man to initiate Depakote for tension headaches but she did not initiate this medication.  Due to ongoing tension headaches, recommended initiating.  It appears as though daughter called other providers with additional symptoms of weakness, gait difficulty, fatigue as well as slurred speech and loss of appetite.  It appears as though she presented to ED for further evaluation as recommended by PCP.  I am glad to see that she went to the ED for further evaluation of potential recurrent stroke.  Symptoms less likely related to side effects of Depakote but would recommend discontinuing.

## 2019-08-07 NOTE — Telephone Encounter (Signed)
Agreed with ER dispo, I will await the ER notes. Thanks.

## 2019-08-07 NOTE — Telephone Encounter (Signed)
Looks like Janett Billow saw patient last. Advise her to address this concern

## 2019-08-07 NOTE — Telephone Encounter (Signed)
I called pts daughter [name] about her mom having side effects from the depakote. She stated pt is in the ED and they are waiting to find out what the doctors are going to do. She stated pt was confused and has loss of appetite.Meda Coffee stated Janett Billow NP was made aware of the pts side effects to the medication. Darlene stated pt is no longer on the medication. The pt was complaining to the ED about her heart and fatigue. I stated pt can cause [place] back if the ED back if she continues to have tension headaches,and need to be evaluated. The daughter verbalized understanding.

## 2019-08-07 NOTE — Telephone Encounter (Signed)
Pearlie (DPR signed) spoke with Loma Sousa to see if needed a referral for pt to go to [facility] ED. Loma Sousa advised did not need referral to go to ED. Pearlie was taking pt to [facility] ED now; Per Pearlie pt did not think she needed to go to ED on 08/06/19 but pt thinks she needs to go to ED today due to heart problems. FYI to Dr Damita Dunnings.

## 2019-08-07 NOTE — ED Provider Notes (Signed)
[facility] DEPT Provider Note   CSN: [id number] Arrival date & time: 08/07/19  1114     History   Chief Complaint Chief Complaint  Patient presents with   Numbness   Abdominal Pain   Back Pain    HPI Kelly Kennedy is a 78 y.o. female.     HPI Patient states that she last felt her normal self 2 days ago.  Since that time she is had waxing and waning numbness and weakness to her right upper extremity and right lower extremity.  She states she is also had episodic central chest discomfort with palpitations.  Minimal discomfort at present.  She is taken nitroglycerin for this with some improvement.  Denies any shortness of breath.  She has minimal nonproductive cough.  She said no fever or chills.  Patient states that since onset of weakness she is also had difficulty with word finding.  She denies any visual changes.  No falls or recent trauma.  Denies headache.  She does have ongoing low back pain which radiates down the right leg.  No incontinence or retention. Past Medical History:  Diagnosis Date   adenomatous Colon polyps    colonoscopy 8/99, 12/02, 8/06   Arthritis    Bell's palsy    Cancer (HCC)    DCIS left breast   Chest pain    uses NTG as needed   Depression    Diabetes mellitus without complication ([place])    Diverticulosis of colon 06/2005   Ductal carcinoma in situ (DCIS) of left breast    Family history of adverse reaction to anesthesia    " my mother didn't wake up so they had to put her on life support for about an hour or more; my sisiter has problems waking up too from anesthesia.   GERD (gastroesophageal reflux disease)    H/O hiatal hernia    Headache(784.0)    Hyperglycemia    Hyperlipidemia    Hypertension    Hypothyroid    Insomnia    Irritable bowel    Menopausal symptoms    Pericarditis 1980's   Stroke [facility])    TIA (transient ischemic attack)    09/2014    Patient Active Problem  List   Diagnosis Date Noted   Abnormal findings on diagnostic imaging of lung 07/28/2019   PVCs (premature ventricular contractions) 10/11/2018   Abnormal cardiac CT angiography 09/20/2018   Coronary artery disease 09/12/2018   Concussion wth loss of consciousness of 30 minutes or less 08/08/2018   Pulmonary nodule 08/08/2018   Fatty liver 08/05/2018   Liver hemangioma 08/05/2018   Irritation of oral cavity 09/22/2017   Skin lesion 04/20/2017   Abnormal prominence of clavicle 03/24/2017   SUI (stress urinary incontinence, female) 12/20/2016   Neoplasm of left breast, primary tumor staging category Tis: ductal carcinoma in situ (DCIS) 08/06/2015   Malignant neoplasm of upper-outer quadrant of left breast in female, estrogen receptor positive ([place]) 06/10/2015   History of breast cancer 05/14/2015   Acute non-recurrent maxillary sinusitis 02/08/2015   Left sided numbness 12/11/2014   HLD (hyperlipidemia) 12/11/2014   History of diabetes mellitus 12/11/2014   Vaginitis and vulvovaginitis 10/10/2014   Chest pain with moderate risk of acute coronary syndrome 09/28/2014   Essential hypertension 09/28/2014   ? History of TIA (transient ischemic attack) 09/28/2014   Osteopenia 07/09/2014   Advance care planning 07/06/2014   Obesity, unspecified 07/03/2013   Anxiety state, unspecified 07/03/2013   Atypical chest pain  07/03/2013   Medicare annual wellness visit, subsequent 05/27/2012   MENOPAUSAL SYNDROME 06/15/2010   Insomnia 06/15/2010   GERD 09/16/2008   Hypothyroidism 08/02/2007   DEPENDENT EDEMA [id number]   SYSTOLIC MURMUR [id number]   ANGINA, HX OF 07/17/2007   HIATAL HERNIA 06/28/2005   DIVERTICULOSIS, COLON W/O HEM 06/28/2005    Past Surgical History:  Procedure Laterality Date   APPENDECTOMY     BREAST EXCISIONAL BIOPSY Right    benign   BREAST RECONSTRUCTION WITH PLACEMENT OF TISSUE EXPANDER AND FLEX HD (ACELLULAR HYDRATED  DERMIS) Left 08/06/2015   Procedure: LEFT BREAST RECONSTRUCTION WITH PLACEMENT OF SALINE IMPLANT AND ACELLULAR DURMAL MATRIX;  Surgeon: Crissie Reese, MD;  Location: [facility];  Service: Plastics;  Laterality: Left;   CARDIAC CATHETERIZATION     CATARACT EXTRACTION Bilateral 2016   CHOLECYSTECTOMY  1990   CYSTECTOMY  05/04/08   left lower arm, benign   ECTOPIC PREGNANCY SURGERY  1971   FRACTURE SURGERY     nose   LEFT HEART CATH AND CORONARY ANGIOGRAPHY [name] 09/21/2018   Procedure: LEFT HEART CATH AND CORONARY ANGIOGRAPHY;  Surgeon: Leonie Man, MD;  Location: [facility];  Service: Cardiovascular;  Laterality: [name];   MASTECTOMY Left    NIPPLE SPARING MASTECTOMY Left 08/06/2015   Procedure: LEFT NIPPLE SPARING MASTECTOMY;  Surgeon: [name], MD;  Location: [facility];  Service: General;  Laterality: Left;   ORIF ANKLE FRACTURE Left 10/23/2013   Procedure: OPEN REDUCTION INTERNAL FIXATION (ORIF) LEFT ANKLE FRACTURE;  Surgeon: Marianna Payment, MD;  Location: [facility];  Service: Orthopedics;  Laterality: Left;   PARTIAL HYSTERECTOMY     ovaries intact   PARTIAL MASTECTOMY WITH NEEDLE LOCALIZATION AND AXILLARY SENTINEL LYMPH NODE BX Left 05/06/2015   Procedure: LEFT BREAST PARTIAL MASTECTOMY WITH NEEDLE LOCALIZATION AND SENTINEL LYMPH NODE MAPPING;  Surgeon: [name], MD;  Location: [facility];  Service: General;  Laterality: Left;   TONSILLECTOMY     TUBAL LIGATION       OB History   No obstetric history on file.      Home Medications    Prior to Admission medications   Medication Sig Start Date End Date Taking? Authorizing Provider  amitriptyline (ELAVIL) 50 MG tablet TAKE 1 TO 2 TABLETS BY MOUTH AT BEDTIME AS NEEDED FOR SLEEP Patient taking differently: Take 50 mg by mouth at bedtime as needed for sleep.  12/05/18  Yes [name], MD  amLODipine (NORVASC) 5 MG tablet Take 1 tablet (5 mg total) by mouth daily at 2 PM. 10/15/18  Yes [name], MD  Aspirin-Salicylamide-Caffeine [facility] HEADACHE POWDER PO) Take 1 packet by mouth 2 (two) times daily as needed (pain).    Yes [provider]  carvedilol (COREG) 6.25 MG tablet TAKE 1 TABLET BY MOUTH TWICE A DAY Patient taking differently: Take 6.25 mg by mouth 2 (two) times daily with a meal.  07/22/19  Yes Croitoru, Mihai, MD  diclofenac sodium (VOLTAREN) 1 % GEL Apply 2-4 g topically 4 (four) times daily as needed (for pain .). 10/15/18  Yes [name], MD  divalproex (DEPAKOTE ER) 500 MG 24 hr tablet TAKE 1 TABLET BY MOUTH EVERY DAY Patient taking differently: Take 500 mg by mouth daily.  07/17/19  Yes Garvin Fila, MD  Evolocumab (REPATHA SURECLICK) [id number] MG/ML SOAJ Inject 140 mg into the skin every 14 (fourteen) days. 09/26/18  Yes Croitoru, Mihai, MD  fluocinonide cream (LIDEX) 0.05 % Apply  topically daily as needed.  Use sparingly. Patient taking differently: Apply 1 application topically daily as needed (use sparingly).  12/04/18  Yes [name], MD  levothyroxine (SYNTHROID, LEVOTHROID) 75 MCG tablet Take 1 tablet (75 mcg total) by mouth daily before breakfast. 10/15/18  Yes [name], MD  LUMIGAN 0.01 % SOLN Place 1 drop into both eyes at bedtime. 06/29/19  Yes [provider]  magnesium oxide (MAG-OX) 400 MG tablet TAKE 1 TABLET BY MOUTH EVERY DAY 06/11/19  Yes [name], MD  MYRBETRIQ 50 MG TB24 tablet Take 50 mg by mouth every evening.  09/01/18  Yes [provider]  nitrofurantoin (MACRODANTIN) 100 MG capsule Take 100 mg by mouth 4 (four) times daily.   Yes [provider]  nitroGLYCERIN (NITROSTAT) 0.4 MG SL tablet PLACE 1 TABLET (0.4 MG TOTAL) UNDER THE TONGUE EVERY 5 (FIVE) MINUTES AS NEEDED FOR CHEST PAIN. Patient taking differently: Place 0.4 mg under the tongue every 5 (five) minutes as needed for chest pain.  09/12/18  Yes [name], MD  omeprazole (PRILOSEC OTC) 20 MG tablet Take 20 mg by mouth daily.    Yes [provider]  orphenadrine (NORFLEX) 100 MG tablet Take 1 tablet (100 mg total) by mouth 2 (two) times daily. 07/27/18  Yes Charlesetta Shanks, MD  solifenacin (VESICARE) 5 MG tablet Take 5 mg by mouth daily.   Yes [provider]  tamoxifen (NOLVADEX) 20 MG tablet TAKE 1 TABLET (20 MG TOTAL) BY MOUTH DAILY. 03/18/19  Yes Magrinat, Virgie Dad, MD  topiramate (TOPAMAX) 25 MG tablet Take 25 mg by mouth 2 (two) times daily.   Yes [provider]  diclofenac (VOLTAREN) 75 MG EC tablet Take 1 tablet (75 mg total) by mouth 2 (two) times daily as needed (for pain.). Patient not taking: Reported on 08/07/2019 10/15/18   [name], MD  metroNIDAZOLE (METROGEL) 1 % gel Apply topically daily. Patient not taking: Reported on 08/07/2019 07/08/19   Magrinat, Virgie Dad, MD    Family History Family History  Problem Relation Age of Onset   Cancer Father        stomach    Stomach cancer Father    Heart disease Son        MI   Arthritis Mother    Diabetes Mother    Cancer Brother        lung cancer   Colon cancer Neg Hx    Pancreatic cancer Neg Hx    Breast cancer Neg Hx     Social History Social History   Tobacco Use   Smoking status: Former Smoker    Quit date: 04/03/2017    Years since quitting: 2.3   Smokeless tobacco: Former Systems developer   Tobacco comment: [place].  Substance Use Topics   Alcohol use: No    Alcohol/week: 0.0 standard drinks    Comment: social   Drug use: No     Allergies   Ace inhibitors, Angiotensin receptor blockers, Statins, Flonase [fluticasone propionate], Glipizide, Niacin, and Red dye   Review of Systems Review of Systems  Constitutional: Negative for chills and fever.  HENT: Negative for facial swelling, sore throat and trouble swallowing.   Eyes: Negative for pain and visual disturbance.  Respiratory: Positive for cough. Negative for shortness of breath.   Cardiovascular: Positive for chest pain  and palpitations. Negative for leg swelling.  Gastrointestinal: Negative for abdominal pain, constipation, diarrhea, nausea and vomiting.  Genitourinary: Negative for dysuria, flank pain and frequency.  Musculoskeletal: Positive for arthralgias, back pain, gait problem and myalgias. Negative for joint swelling, neck pain and neck stiffness.  Skin: Negative for rash and wound.  Neurological: Positive for speech difficulty, weakness and numbness. Negative for syncope, facial asymmetry, light-headedness and headaches.  All other systems reviewed and are negative.    Physical Exam Updated Vital Signs BP (!) 143/67    Pulse 89    Temp 98.6 F (37 C) (Oral)    Resp 13    SpO2 99%   Physical Exam Vitals signs and nursing note reviewed.  Constitutional:      General: She is not in acute distress.    Appearance: Normal appearance. She is well-developed. She is not ill-appearing.  HENT:     Head: Normocephalic and atraumatic.     Comments: No facial asymmetry.  Facial sensation intact.    Mouth/Throat:     Mouth: Mucous membranes are moist.  Eyes:     Extraocular Movements: Extraocular movements intact.     Pupils: Pupils are equal, round, and reactive to light.  Neck:     Musculoskeletal: Normal range of motion and neck supple. No neck rigidity or muscular tenderness.     Vascular: No carotid bruit.  Cardiovascular:     Rate and Rhythm: Normal rate and regular rhythm.     Heart sounds: No murmur. No friction rub. No gallop.   Pulmonary:     Effort: Pulmonary effort is normal. No respiratory distress.     Breath sounds: Normal breath sounds. No stridor. No wheezing or rhonchi.  Chest:     Chest wall: No tenderness.  Abdominal:     General: Bowel sounds are normal. There is no distension.     Palpations: Abdomen is soft. There is no mass.     Tenderness: There is no abdominal tenderness. There is no right CVA tenderness, left CVA tenderness, guarding or rebound.     Hernia: No hernia  is present.  Musculoskeletal: Normal range of motion.        General: Tenderness present. No swelling, deformity or signs of injury.     Right lower leg: No edema.     Left lower leg: No edema.     Comments: Patient has mild tenderness to palpation over the right deltoid.  No midline thoracic tenderness.  Patient does have inferior lumbar midline tenderness and right paraspinal lumbar tenderness to palpation.  Negative straight leg raise bilaterally.  Distal pulses are 2+.  No calf asymmetry or tenderness.  Lymphadenopathy:     Cervical: No cervical adenopathy.  Skin:    General: Skin is warm and dry.     Capillary Refill: Capillary refill takes less than 2 seconds.     Findings: No erythema or rash.  Neurological:     Mental Status: She is alert and oriented to person, place, and time.     Comments: Decreased sensation to light touch of the right upper extremity and right lower extremity.  Mild 4/5 motor weakness of the right upper and lower extremities compared to 5/5 motor left upper and lower extremities.  Patient has mild word finding difficulty.  No overt ataxia.  Psychiatric:        Mood and Affect: Mood normal.        Behavior: Behavior normal.      ED Treatments / Results  Labs (all labs ordered are listed, but only abnormal results are displayed) Labs Reviewed  COMPREHENSIVE  METABOLIC PANEL - Abnormal; Notable for the following components:      Result Value   Potassium 3.4 (*)    Glucose, Bld 123 (*)    All other components within normal limits  URINALYSIS, ROUTINE W REFLEX MICROSCOPIC - Abnormal; Notable for the following components:   Ketones, ur 5 (*)    All other components within normal limits  SARS CORONAVIRUS 2 (HOSPITAL ORDER, PERFORMED IN [place] [facility])  CBC WITH DIFFERENTIAL/PLATELET  MAGNESIUM  PROTIME-INR  APTT  VALPROIC ACID LEVEL  TROPONIN I (HIGH SENSITIVITY)  TROPONIN I (HIGH SENSITIVITY)    EKG EKG  Interpretation  Date/Time:  Wednesday August 07 2019 13:14:06 EDT Ventricular Rate:  92 PR Interval:    QRS Duration: 102 QT Interval:  320 QTC Calculation: 396 R Axis:   65 Text Interpretation:  Sinus rhythm Borderline repolarization abnormality No significant change since last tracing Confirmed by Julianne Rice [id number]) on 08/07/2019 5:35:55 PM   Radiology Ct Head Wo Contrast  Result Date: 08/07/2019 CLINICAL DATA:  78 year old female with history of intermittent right leg numbness and pain and intermittent right face numbness and tingling. EXAM: CT HEAD WITHOUT CONTRAST TECHNIQUE: Contiguous axial images were obtained from the base of the skull through the vertex without intravenous contrast. COMPARISON:  Head CT 07/27/2018. FINDINGS: Brain: No evidence of acute infarction, hemorrhage, hydrocephalus, extra-axial collection or mass lesion/mass effect. Vascular: No hyperdense vessel or unexpected calcification. Skull: Normal. Negative for fracture or focal lesion. Sinuses/Orbits: No acute finding. Other: None. IMPRESSION: 1. No acute intracranial abnormalities. Electronically Signed   By: Vinnie Langton M.D.   On: 08/07/2019 14:47   Mr Brain Wo Contrast  Result Date: 08/07/2019 CLINICAL DATA:  Focal neuro deficit greater than 6 hours. Right leg numbness and pain EXAM: MRI HEAD WITHOUT CONTRAST TECHNIQUE: Multiplanar, multiecho pulse sequences of the brain and surrounding structures were obtained without intravenous contrast. COMPARISON:  CT head 08/07/2019.  MRI head 01/04/2019 FINDINGS: Brain: Negative for acute infarct. Mild changes in the pons and white matter consistent with microvascular ischemia. Ventricle size normal.  Negative for hemorrhage or mass. Vascular: Normal arterial flow voids Skull and upper cervical spine: Negative Sinuses/Orbits: Mild mucosal edema paranasal sinuses. Bilateral cataract surgery Other: None IMPRESSION: Negative for acute infarct Mild chronic microvascular  ischemic change in the pons and white matter bilaterally. Electronically Signed   By: Franchot Gallo M.D.   On: 08/07/2019 16:18   Dg Chest Port 1 View  Result Date: 08/07/2019 CLINICAL DATA:  Chest pain. EXAM: PORTABLE CHEST 1 VIEW COMPARISON:  09/28/2014 and chest CT dated 08/02/2019 FINDINGS: The heart size and mediastinal contours are within normal limits. Both lungs are clear. The visualized skeletal structures are unremarkable. Aortic atherosclerosis. No effusions. IMPRESSION: No acute cardiopulmonary disease. Aortic atherosclerosis. Electronically Signed   By: Lorriane Shire M.D.   On: 08/07/2019 12:54    Procedures Procedures (including critical care time)  Medications Ordered in ED Medications - No data to display   Initial Impression / Assessment and Plan / ED Course  I have reviewed the triage vital signs and the nursing notes.  Pertinent labs & imaging results that were available during my care of the patient were reviewed by me and considered in my medical decision making (see chart for details).        Discussed with Dr. Malen Gauze.  CT head without acute findings.  Suggest getting MRI brain.  If no concerning findings can follow-up with her neurologist.  MRI without concerning  findings.  Patient with atypical chest pain.  EKG without ischemic findings.  Troponin x2 is normal.  Advised follow-up with both her neurologist and cardiologist.  Return precautions given. Final Clinical Impressions(s) / ED Diagnoses   Final diagnoses:  Numbness and tingling of right arm and leg  Atypical chest pain    ED Discharge Orders    None       Julianne Rice, MD 08/07/19 1736

## 2019-08-07 NOTE — ED Notes (Addendum)
Pt transported to MRI 

## 2019-08-12 ENCOUNTER — Telehealth: Payer: Self-pay

## 2019-08-12 NOTE — Telephone Encounter (Signed)
No telephone call from this office documented. It may have been an older VM.

## 2019-08-12 NOTE — Telephone Encounter (Signed)
[place] Night - Client Nonclinical Telephone Record AccessNurse Client [place] Night - Client Client Site [place] Physician Renford Dills - MD Contact Type Call Who Is Calling Patient / Member / Family / Caregiver Caller Name Annesley Lozzi Caller Phone Number [phone] Call Type Message Only Information Provided Reason for Call Returning a Call from the Office Initial [place] states her sister missed a call from the office. Additional Comment Call Closed By: Dierdre Searles Transaction Date/Time: 08/09/2019 5:05:27 PM (ET)

## 2019-08-13 ENCOUNTER — Telehealth: Payer: Self-pay | Admitting: *Deleted

## 2019-08-13 ENCOUNTER — Other Ambulatory Visit: Payer: Self-pay | Admitting: Family Medicine

## 2019-08-13 MED ORDER — NYSTATIN 100000 UNIT/ML MT SUSP: 5.0000 mL | Freq: Four times a day (QID) | OROMUCOSAL | 1 refills | Status: DC

## 2019-08-13 MED ORDER — FLUCONAZOLE 150 MG PO TABS: 150.0000 mg | ORAL_TABLET | Freq: Once | ORAL | 0 refills | Status: DC

## 2019-08-13 NOTE — Telephone Encounter (Signed)
Patient says that Dr. Damita Dunnings has prescribed some medication in the past for these same symptoms and has told her to let him know if this happens again.  Patient has burning in her private area and feels that the medication that she was given by another MD caused this problem.  She is no longer on the medication that she reacted to.  The medication that she is requesting is Azithromycin and Mg P Nystatin for her mouth sores.

## 2019-08-13 NOTE — Telephone Encounter (Signed)
Called patient.  She was requesting nystatin and fluconazole for thrush and a yeast infection.  She has had similar in the past.  rxs sent. She'll update me as needed.  She thanked me for the call.

## 2019-08-13 NOTE — Telephone Encounter (Signed)
Patient advised.

## 2019-08-23 ENCOUNTER — Other Ambulatory Visit: Payer: Self-pay

## 2019-08-23 DIAGNOSIS — R6889 Other general symptoms and signs: Secondary | ICD-10-CM | POA: Diagnosis not present

## 2019-08-23 DIAGNOSIS — Z20822 Contact with and (suspected) exposure to covid-19: Secondary | ICD-10-CM

## 2019-08-24 LAB — NOVEL CORONAVIRUS, NAA: SARS-CoV-2, NAA: NOT DETECTED

## 2019-08-26 ENCOUNTER — Encounter: Payer: Self-pay | Admitting: Gastroenterology

## 2019-09-03 ENCOUNTER — Other Ambulatory Visit: Payer: Self-pay | Admitting: Family Medicine

## 2019-09-03 ENCOUNTER — Ambulatory Visit: Payer: Medicare Other | Admitting: Neurology

## 2019-09-10 ENCOUNTER — Encounter: Payer: Medicare Other | Admitting: Gastroenterology

## 2019-09-17 ENCOUNTER — Encounter: Payer: Self-pay | Admitting: Gastroenterology

## 2019-09-17 ENCOUNTER — Other Ambulatory Visit: Payer: Self-pay

## 2019-09-17 ENCOUNTER — Ambulatory Visit (AMBULATORY_SURGERY_CENTER): Payer: Medicare Other | Admitting: *Deleted

## 2019-09-17 VITALS — Temp 96.8°F | Ht 63.0 in | Wt 167.0 lb

## 2019-09-17 DIAGNOSIS — Z86010 Personal history of colonic polyps: Secondary | ICD-10-CM

## 2019-09-17 DIAGNOSIS — Z1159 Encounter for screening for other viral diseases: Secondary | ICD-10-CM

## 2019-09-17 MED ORDER — SUPREP BOWEL PREP KIT 17.5-3.13-1.6 GM/177ML PO SOLN: 1.0000 | Freq: Once | ORAL | 0 refills | Status: AC

## 2019-09-17 NOTE — Progress Notes (Signed)
No egg or soy allergy known to patient  No issues with past sedation with any surgeries  or procedures, no intubation problems  Patient and daughter, Simmie Davies present during previsit, unaware of any family complications of anesthesia No diet pills per patient No home 02 use per patient  No blood thinners per patient  Pt denies issues with constipation  No A fib or A flutter  Daughter Pearlie present during Previsit, temp 96.4 Patient very unsure of medications, patient and daughter instructed to bring med list the day of procedure covid testing 09/27/2019 at 69  EMMI information given  Due to the COVID-19 pandemic we are asking patients to follow these guidelines. Please only bring one care partner. Please be aware that your care partner may wait in the car in the parking lot or if they feel like they will be too hot to wait in the car, they may wait in the lobby on the 4th floor. All care partners are required to wear a mask the entire time (we do not have any that we can provide them), they need to practice social distancing, and we will do a Covid check for all patient's and care partners when you arrive. Also we will check their temperature and your temperature. If the care partner waits in their car they need to stay in the parking lot the entire time and we will call them on their cell phone when the patient is ready for discharge so they can bring the car to the front of the building. Also all patient's will need to wear a mask into building.

## 2019-09-19 ENCOUNTER — Ambulatory Visit: Payer: Medicare Other | Admitting: Adult Health

## 2019-09-20 ENCOUNTER — Encounter: Payer: Self-pay | Admitting: Family Medicine

## 2019-09-20 ENCOUNTER — Other Ambulatory Visit: Payer: Self-pay

## 2019-09-20 ENCOUNTER — Ambulatory Visit (INDEPENDENT_AMBULATORY_CARE_PROVIDER_SITE_OTHER): Payer: Medicare Other | Admitting: Family Medicine

## 2019-09-20 VITALS — BP 142/80 | Temp 97.9°F | Ht 63.0 in | Wt 163.1 lb

## 2019-09-20 DIAGNOSIS — I10 Essential (primary) hypertension: Secondary | ICD-10-CM | POA: Diagnosis not present

## 2019-09-20 DIAGNOSIS — R202 Paresthesia of skin: Secondary | ICD-10-CM

## 2019-09-20 MED ORDER — NITROGLYCERIN 0.4 MG SL SUBL: 0.4000 mg | SUBLINGUAL_TABLET | SUBLINGUAL | Status: DC | PRN

## 2019-09-20 NOTE — Patient Instructions (Addendum)
Check to see if you are still taking topiramate 25mg  twice a day.  If so, then cut back to once a day.  If only taking it once a day, then stop it.   Let me know how you are doing in about 1 week, sooner if needed.    Take care.  Glad to see you.

## 2019-09-20 NOTE — Progress Notes (Signed)
She has been off amlodipine, was dizzy and had dec appetite on med.  She felt better with stoppage of medication.  ER f/u. She was fatigued, sleepy, had some trouble with word finding and numbness in the RUE and RLE.  She had some tingling in the hands B.  She has episodes of hand weakness, where she will have to use both hands to pick up a book or small item.  She had fallen last week, lost balance and fell.  No syncope.  She was able to get up on her own, w/o SZ activity.  The event was witnessed.  PMH and SH reviewed  ROS: Per HPI unless specifically indicated in ROS section   Meds, vitals, and allergies reviewed.   GEN: nad, alert and oriented HEENT:ncat NECK: supple w/o LA CV: rrr PULM: ctab, no inc wob ABD: soft, +bs EXT: no edema SKIN: Well-perfused  CN 2-12 wnl B, S/S/DTR wnl x4

## 2019-09-21 ENCOUNTER — Other Ambulatory Visit: Payer: Self-pay | Admitting: Cardiovascular Disease

## 2019-09-22 NOTE — Assessment & Plan Note (Addendum)
She may have a 2 separate issues going on.  See paresthesias discussion.  She may have had her blood pressure overtreated where she was lightheaded while at home amlodipine.  Her blood pressure is reasonable and she feels better off the medications I would continue off amlodipine for now.  Discussed with patient.  This may have been an issue of overtreatment but not an intolerance or allergy to the medication.

## 2019-09-22 NOTE — Assessment & Plan Note (Signed)
Unclear if this was related to Topamax.  Discussed with patient I want her to check her med list at home and see if she is still taking Topamax 25 mg twice a day.    If so, then cut back to once a day.  If only taking it once a day, then stop it.   She will let me know how she is doing in about 1 week, sooner if needed.   >25 minutes spent in face to face time with patient, >50% spent in counselling or coordination of care.

## 2019-09-24 ENCOUNTER — Other Ambulatory Visit: Payer: Self-pay | Admitting: Neurology

## 2019-09-28 ENCOUNTER — Other Ambulatory Visit (HOSPITAL_COMMUNITY)
Admission: RE | Admit: 2019-09-28 | Discharge: 2019-09-28 | Disposition: A | Discharge status: Home / Self Care | Payer: Medicare Other | Source: Ambulatory Visit | Attending: Gastroenterology | Admitting: Gastroenterology

## 2019-09-28 DIAGNOSIS — Z01812 Encounter for preprocedural laboratory examination: Secondary | ICD-10-CM | POA: Diagnosis not present

## 2019-09-28 DIAGNOSIS — Z20828 Contact with and (suspected) exposure to other viral communicable diseases: Secondary | ICD-10-CM | POA: Insufficient documentation

## 2019-09-29 LAB — NOVEL CORONAVIRUS, NAA (HOSP ORDER, SEND-OUT TO REF LAB; TAT 18-24 HRS): SARS-CoV-2, NAA: NOT DETECTED

## 2019-10-02 ENCOUNTER — Encounter: Payer: Self-pay | Admitting: Gastroenterology

## 2019-10-02 ENCOUNTER — Other Ambulatory Visit: Payer: Self-pay

## 2019-10-02 ENCOUNTER — Ambulatory Visit (AMBULATORY_SURGERY_CENTER): Payer: Medicare Other | Admitting: Gastroenterology

## 2019-10-02 VITALS — BP 148/76 | HR 81 | Temp 98.1°F | Resp 12 | Ht 63.0 in | Wt 167.0 lb

## 2019-10-02 DIAGNOSIS — D122 Benign neoplasm of ascending colon: Secondary | ICD-10-CM | POA: Diagnosis not present

## 2019-10-02 DIAGNOSIS — Z86010 Personal history of colonic polyps: Secondary | ICD-10-CM | POA: Diagnosis not present

## 2019-10-02 DIAGNOSIS — D123 Benign neoplasm of transverse colon: Secondary | ICD-10-CM

## 2019-10-02 MED ORDER — SODIUM CHLORIDE 0.9 % IV SOLN: 500.0000 mL | INTRAVENOUS | Status: DC

## 2019-10-02 NOTE — Progress Notes (Signed)
PT taken to PACU. Monitors in place. VSS. Report given to RN. 

## 2019-10-02 NOTE — Progress Notes (Signed)
Called to room to assist during endoscopic procedure.  Patient ID and intended procedure confirmed with present staff. Received instructions for my participation in the procedure from the performing physician.  

## 2019-10-02 NOTE — Op Note (Addendum)
[facility] Patient Name: Kelly Kennedy Procedure Date: 10/02/2019 8:03 AM MRN: [id number] Endoscopist: Ladene Artist , MD Age: 78 Referring MD:  Date of Birth: 04/26/1941 Gender: Female Account #: [id number] Procedure:                Colonoscopy Indications:              Surveillance: Personal history of adenomatous                            polyps on last colonoscopy > 5 years ago Medicines:                Monitored Anesthesia Care Procedure:                Pre-Anesthesia Assessment:                           - Prior to the procedure, a History and Physical                            was performed, and patient medications and                            allergies were reviewed. The patient's tolerance of                            previous anesthesia was also reviewed. The risks                            and benefits of the procedure and the sedation                            options and risks were discussed with the patient.                            All questions were answered, and informed consent                            was obtained. Prior Anticoagulants: The patient has                            taken no previous anticoagulant or antiplatelet                            agents. ASA Grade Assessment: III - A patient with                            severe systemic disease. After reviewing the risks                            and benefits, the patient was deemed in                            satisfactory condition to undergo the procedure.  After obtaining informed consent, the colonoscope                            was passed under direct vision. Throughout the                            procedure, the patient's blood pressure, pulse, and                            oxygen saturations were monitored continuously. The                            Colonoscope was introduced through the anus and                            advanced to the the  cecum, identified by                            appendiceal orifice and ileocecal valve. The                            ileocecal valve, appendiceal orifice, and rectum                            were photographed. The quality of the bowel                            preparation was good. The colonoscopy was performed                            without difficulty. The patient tolerated the                            procedure well. Scope In: 8:07:31 AM Scope Out: 8:26:38 AM Scope Withdrawal Time: 0 hours 15 minutes 13 seconds  Total Procedure Duration: 0 hours 19 minutes 7 seconds  Findings:                 The perianal and digital rectal examinations were                            normal.                           Four sessile polyps were found in the transverse                            colon (1) and ascending colon (3). The polyps were                            7 to 9 mm in size. These polyps were removed with a                            cold snare. Resection and retrieval were complete.  Internal hemorrhoids were found during                            retroflexion. The hemorrhoids were small and Grade                            I (internal hemorrhoids that do not prolapse).                           The exam was otherwise without abnormality on                            direct and retroflexion views. Complications:            No immediate complications. Estimated blood loss:                            None. Estimated Blood Loss:     Estimated blood loss: none. Impression:               - Four 7 to 9 mm polyps in the transverse colon and                            in the ascending colon, removed with a cold snare.                            Resected and retrieved.                           - Internal hemorrhoids.                           - The examination was otherwise normal on direct                            and retroflexion views. Recommendation:            - Repeat colonoscopy likely in 3 years for                            surveillance based on pathology review.                           - Patient has a contact number available for                            emergencies. The signs and symptoms of potential                            delayed complications were discussed with the                            patient. Return to normal activities tomorrow.                            Written discharge instructions were provided to the  patient.                           - Resume previous diet.                           - Continue present medications.                           - Await pathology results. Ladene Artist, MD 10/02/2019 8:31:02 AM This report has been signed electronically.

## 2019-10-02 NOTE — Patient Instructions (Signed)
Information on polyps and hemorrhoids given to you today.  Await pathology results.  YOU HAD AN ENDOSCOPIC PROCEDURE TODAY AT THE [facility]:   Refer to the procedure report that was given to you for any specific questions about what was found during the examination.  If the procedure report does not answer your questions, please call your gastroenterologist to clarify.  If you requested that your care partner not be given the details of your procedure findings, then the procedure report has been included in a sealed envelope for you to review at your convenience later.  YOU SHOULD EXPECT: Some feelings of bloating in the abdomen. Passage of more gas than usual.  Walking can help get rid of the air that was put into your GI tract during the procedure and reduce the bloating. If you had a lower endoscopy (such as a colonoscopy or flexible sigmoidoscopy) you may notice spotting of blood in your stool or on the toilet paper. If you underwent a bowel prep for your procedure, you may not have a normal bowel movement for a few days.  Please Note:  You might notice some irritation and congestion in your nose or some drainage.  This is from the oxygen used during your procedure.  There is no need for concern and it should clear up in a day or so.  SYMPTOMS TO REPORT IMMEDIATELY:   Following lower endoscopy (colonoscopy or flexible sigmoidoscopy):  Excessive amounts of blood in the stool  Significant tenderness or worsening of abdominal pains  Swelling of the abdomen that is new, acute  Fever of 100F or higher   For urgent or emergent issues, a gastroenterologist can be reached at any hour by calling [phone].   DIET:  We do recommend a small meal at first, but then you may proceed to your regular diet.  Drink plenty of fluids but you should avoid alcoholic beverages for 24 hours.  ACTIVITY:  You should plan to take it easy for the rest of today and you should NOT DRIVE or use  heavy machinery until tomorrow (because of the sedation medicines used during the test).    FOLLOW UP: Our staff will call the number listed on your records 48-72 hours following your procedure to check on you and address any questions or concerns that you may have regarding the information given to you following your procedure. If we do not reach you, we will leave a message.  We will attempt to reach you two times.  During this call, we will ask if you have developed any symptoms of COVID 19. If you develop any symptoms (ie: fever, flu-like symptoms, shortness of breath, cough etc.) before then, please call [phone].  If you test positive for Covid 19 in the 2 weeks post procedure, please call and report this information to us.    If any biopsies were taken you will be contacted by phone or by letter within the next 1-3 weeks.  Please call us at [phone] if you have not heard about the biopsies in 3 weeks.    SIGNATURES/CONFIDENTIALITY: You and/or your care partner have signed paperwork which will be entered into your electronic medical record.  These signatures attest to the fact that that the information above on your After Visit Summary has been reviewed and is understood.  Full responsibility of the confidentiality of this discharge information lies with you and/or your care-partner. 

## 2019-10-04 ENCOUNTER — Telehealth: Payer: Self-pay

## 2019-10-04 NOTE — Telephone Encounter (Deleted)
  Follow up Call-  Call back number 10/02/2019  Post procedure Call Back phone  # [phone]. [phone]  Permission to leave phone message Yes  Some recent data might be hidden     Patient questions:  Do you have a fever, pain , or abdominal swelling? No.    Pain Score  0 *  Have you tolerated food without any problems? Yes.    Have you been able to return to your normal activities? Yes.    Do you have any questions about your discharge instructions: Diet   No. Medications  No. Follow up visit  No.  Do you have questions or concerns about your Care? No.     Actions: * If pain score is 4 or above: {ACTION; [facility] ENDO PAIN >4:21563::"No actn needed, pain <4."}    1. Have you developed a fever since your procedure? no  2.   Have you had an respiratory symptoms (SOB or cough) since your procedure? no  3.   Have you tested positive for COVID 19 since your procedure no  4.   Have you had any family members/close contacts diagnosed with the COVID 19 since your procedure?  no   If yes to any of these questions please route to Joylene John, RN and Alphonsa Gin, Therapist, sports.

## 2019-10-04 NOTE — Telephone Encounter (Signed)
  Follow up Call-  Call back number 10/02/2019  Post procedure Call Back phone  # [phone]. [phone]  Permission to leave phone message Yes  Some recent data might be hidden     Patient questions:  Do you have a fever, pain , or abdominal swelling? No.   Pain Score  0 *  Have you tolerated food without any problems? Yes.    Have you been able to return to your normal activities? Yes.    Do you have any questions about your discharge instructions: Diet   No. Medications  No. Follow up visit  No.  Do you have questions or concerns about your Care? No.  Actions: * If pain score is 4 or above:   1. Have you developed a fever since your procedure? no  2.   Have you had an respiratory symptoms (SOB or cough) since your procedure? no  3.   Have you tested positive for COVID 19 since your procedure no  4.   Have you had any family members/close contacts diagnosed with the COVID 19 since your procedure?  no   If yes to any of these questions please route to Joylene John, RN and Alphonsa Gin, Therapist, sports.

## 2019-10-11 ENCOUNTER — Encounter: Payer: Self-pay | Admitting: Gastroenterology

## 2019-10-13 ENCOUNTER — Other Ambulatory Visit: Payer: Self-pay | Admitting: Cardiovascular Disease

## 2019-10-17 DIAGNOSIS — N390 Urinary tract infection, site not specified: Secondary | ICD-10-CM | POA: Diagnosis not present

## 2019-10-24 ENCOUNTER — Other Ambulatory Visit: Payer: Self-pay | Admitting: Family Medicine

## 2019-10-28 ENCOUNTER — Other Ambulatory Visit: Payer: Self-pay | Admitting: Oncology

## 2019-10-28 NOTE — Telephone Encounter (Signed)
Electronic refill request. Fluconazole Last office visit:   09/20/2019 Last Filled:   10/31/2017  #2 RF 0 Please advise.

## 2019-10-29 NOTE — Telephone Encounter (Signed)
Sent. Thanks.   

## 2019-10-29 NOTE — Telephone Encounter (Signed)
Please get update on patient, about her current symptoms.  Was this an auto refill request? Please let me know.  Thanks.

## 2019-10-29 NOTE — Telephone Encounter (Signed)
Patient says her Urologist put her on an ABX and this always causes yeast infection.  Patient was advised that in the future, to let the Urologist know that this happens and they can probably prescribe this.

## 2019-10-30 ENCOUNTER — Telehealth: Payer: Self-pay

## 2019-10-30 ENCOUNTER — Ambulatory Visit: Payer: Medicare Other | Admitting: Neurology

## 2019-10-30 ENCOUNTER — Other Ambulatory Visit: Payer: Self-pay

## 2019-10-30 ENCOUNTER — Ambulatory Visit: Payer: Medicare Other | Admitting: Cardiovascular Disease

## 2019-10-30 ENCOUNTER — Encounter: Payer: Self-pay | Admitting: Cardiovascular Disease

## 2019-10-30 VITALS — BP 140/82 | HR 90 | Temp 96.6°F | Ht 63.0 in | Wt 165.0 lb

## 2019-10-30 DIAGNOSIS — I493 Ventricular premature depolarization: Secondary | ICD-10-CM | POA: Diagnosis not present

## 2019-10-30 DIAGNOSIS — R7303 Prediabetes: Secondary | ICD-10-CM | POA: Diagnosis not present

## 2019-10-30 DIAGNOSIS — E7800 Pure hypercholesterolemia, unspecified: Secondary | ICD-10-CM | POA: Diagnosis not present

## 2019-10-30 DIAGNOSIS — I25118 Atherosclerotic heart disease of native coronary artery with other forms of angina pectoris: Secondary | ICD-10-CM | POA: Diagnosis not present

## 2019-10-30 DIAGNOSIS — I10 Essential (primary) hypertension: Secondary | ICD-10-CM | POA: Diagnosis not present

## 2019-10-30 DIAGNOSIS — E785 Hyperlipidemia, unspecified: Secondary | ICD-10-CM | POA: Diagnosis not present

## 2019-10-30 LAB — COMPREHENSIVE METABOLIC PANEL
ALT: 10 IU/L (ref 0–32)
AST: 19 IU/L (ref 0–40)
Albumin/Globulin Ratio: 1.6 (ref 1.2–2.2)
Albumin: 4 g/dL (ref 3.7–4.7)
Alkaline Phosphatase: 68 IU/L (ref 39–117)
BUN/Creatinine Ratio: 14 (ref 12–28)
BUN: 12 mg/dL (ref 8–27)
Bilirubin Total: 0.2 mg/dL (ref 0.0–1.2)
CO2: 22 mmol/L (ref 20–29)
Calcium: 9.5 mg/dL (ref 8.7–10.3)
Chloride: 108 mmol/L — ABNORMAL HIGH (ref 96–106)
Creatinine, Ser: 0.87 mg/dL (ref 0.57–1.00)
GFR calc Af Amer: 74 mL/min/{1.73_m2} (ref >59)
GFR calc non Af Amer: 64 mL/min/{1.73_m2} (ref >59)
Globulin, Total: 2.5 g/dL (ref 1.5–4.5)
Glucose: 98 mg/dL (ref 65–99)
Potassium: 4.9 mmol/L (ref 3.5–5.2)
Sodium: 144 mmol/L (ref 134–144)
Total Protein: 6.5 g/dL (ref 6.0–8.5)

## 2019-10-30 LAB — LIPID PANEL
Chol/HDL Ratio: 1.7 ratio (ref 0.0–4.4)
Cholesterol, Total: 121 mg/dL (ref 100–199)
HDL: 72 mg/dL (ref >39)
LDL Chol Calc (NIH): 26 mg/dL (ref 0–99)
Triglycerides: 139 mg/dL (ref 0–149)
VLDL Cholesterol Cal: 23 mg/dL (ref 5–40)

## 2019-10-30 LAB — HEMOGLOBIN A1C
Est. average glucose Bld gHb Est-mCnc: 123 mg/dL
Hgb A1c MFr Bld: 5.9 % — ABNORMAL HIGH (ref 4.8–5.6)

## 2019-10-30 MED ORDER — CARVEDILOL 6.25 MG PO TABS: 6.2500 mg | ORAL_TABLET | Freq: Two times a day (BID) | ORAL | 3 refills | Status: DC

## 2019-10-30 NOTE — Progress Notes (Signed)
Cardiology Office Note:    Date:  10/30/2019   ID:  Kelly Kennedy, DOB [birth year], MRN [id number]  PCP:  [name], MD  Cardiologist:  Sanda Klein, MD   Referring MD: [name], MD   Chief Complaint  Patient presents with  . Follow-up    12 months.  . Shortness of Breath  . Headache  . Edema    Ankles.  . Chest Pain    Tightness at times.    History of Present Illness:    Kelly Kennedy is a 78 y.o. female with a hx of moderate CAD, multiple coronary risk factors (hypertension, hypercholesterolemia, type 2 diabetes mellitus), palpitations due to PVCs and nonsustained atrial tachycardia, previous TIA, probable previous pericarditis.   Recently she has had problems with palpitations and chest tightness.  The chest tightness usually occurs at rest, most often is noticeable at night when she relaxes and lays in bed.  It responds promptly to nitroglycerin sublingual, within 2 minutes.  She takes an average of 2 or 3 sublingual nitroglycerin tablets a week.  Her beta-blocker has been discontinued.  MRI performed in September for unilateral numbness and tingling did not show evidence of any new brain lesions (mild chronic microvascular ischemic changes in the pons and white matter bilaterally).  She is taking Repatha and has an excellent lipid profile.  Recently she had problems with poor appetite and weakness, possibly related to medications.  Improved after stopping amlodipine and topiramate.  She has lost weight and is no longer obese.  She was intolerant to numerous statins.  She has a history of angioedema with ACE inhibitors.  October 2019 coronary CT angiogram that showed a high-grade stenosis in the mid left circumflex coronary artery, CT FFR of 0.75.  The CT also showed a calcium score of 274 which places her on the 76th percentile.  Cardiac catheterization did not confirm the presence of a single discrete severe stenosis in the LCx coronary  artery:   Dist Cx lesion is 45% stenosed -angiographically moderate at best disease. Does not explain CT scan.  There is hyperdynamic left ventricular systolic function. The left ventricular ejection fraction is greater than 65% by visual estimate.  LV end diastolic pressure is normal.   Mild to moderate disease distal dominant Circumflex.  There is no culprit lesion to explain CT scan findings. Otherwise essentially normal coronary arteries and a left dominant system. Normal LVEF with hyperdynamic function.  The patient had a brief run of PAT/SVT during injection.  She had a low risk nuclear stress test in 2015 and a normal echo in 2016. She underwent surgery for cancer of the left breast.  She did not receive radiation therapy.  She does take a proton pump inhibitor for acid reflux.  Past Medical History:  Diagnosis Date  . adenomatous Colon polyps    colonoscopy 8/99, 12/02, 8/06  . Arthritis   . Bell's palsy   . Cancer [facility])    DCIS left breast  . Cataract   . Chest pain    uses NTG as needed  . Depression   . Diabetes mellitus without complication ([place])   . Diverticulosis of colon 06/2005  . Ductal carcinoma in situ (DCIS) of left breast   . Family history of adverse reaction to anesthesia    " my mother didn't wake up so they had to put her on life support for about an hour or more; my sisiter has problems waking up too from anesthesia.  Marland Kitchen  GERD (gastroesophageal reflux disease)   . Glaucoma   . H/O hiatal hernia   . Headache(784.0)   . Hyperglycemia   . Hyperlipidemia   . Hypertension   . Hypothyroid   . Insomnia   . Irritable bowel   . Menopausal symptoms   . Pericarditis 1980's  . Stroke ([place])   . TIA (transient ischemic attack)    09/2014    Past Surgical History:  Procedure Laterality Date  . APPENDECTOMY    . BREAST EXCISIONAL BIOPSY Right    benign  . BREAST RECONSTRUCTION WITH PLACEMENT OF TISSUE EXPANDER AND FLEX HD (ACELLULAR HYDRATED DERMIS) Left  08/06/2015   Procedure: LEFT BREAST RECONSTRUCTION WITH PLACEMENT OF SALINE IMPLANT AND ACELLULAR DURMAL MATRIX;  Surgeon: Crissie Reese, MD;  Location: [place];  Service: Plastics;  Laterality: Left;  . CARDIAC CATHETERIZATION    . CATARACT EXTRACTION Bilateral 2016  . CHOLECYSTECTOMY  1990  . COLONOSCOPY    . CYSTECTOMY  05/04/08   left lower arm, benign  . ECTOPIC PREGNANCY SURGERY  1971  . FRACTURE SURGERY     nose  . LEFT HEART CATH AND CORONARY ANGIOGRAPHY [name] 09/21/2018   Procedure: LEFT HEART CATH AND CORONARY ANGIOGRAPHY;  Surgeon: Leonie Man, MD;  Location: [place] [facility];  Service: Cardiovascular;  Laterality: [name];  . MASTECTOMY Left   . NIPPLE SPARING MASTECTOMY Left 08/06/2015   Procedure: LEFT NIPPLE SPARING MASTECTOMY;  Surgeon: [name], MD;  Location: [place];  Service: General;  Laterality: Left;  . ORIF ANKLE FRACTURE Left 10/23/2013   Procedure: OPEN REDUCTION INTERNAL FIXATION (ORIF) LEFT ANKLE FRACTURE;  Surgeon: Marianna Payment, MD;  Location: [place];  Service: Orthopedics;  Laterality: Left;  . PARTIAL HYSTERECTOMY     ovaries intact  . PARTIAL MASTECTOMY WITH NEEDLE LOCALIZATION AND AXILLARY SENTINEL LYMPH NODE BX Left 05/06/2015   Procedure: LEFT BREAST PARTIAL MASTECTOMY WITH NEEDLE LOCALIZATION AND SENTINEL LYMPH NODE MAPPING;  Surgeon: [name], MD;  Location: [place];  Service: General;  Laterality: Left;  . TONSILLECTOMY    . TUBAL LIGATION      Current Medications: Current Meds  Medication Sig  . amitriptyline (ELAVIL) 50 MG tablet TAKE 1 TO 2 TABLETS BY MOUTH AT BEDTIME AS NEEDED FOR SLEEP  . Aspirin-Salicylamide-Caffeine (BC HEADACHE POWDER PO) Take 1 packet by mouth 2 (two) times daily as needed (pain).   Marland Kitchen diclofenac sodium (VOLTAREN) 1 % GEL Apply 2-4 g topically 4 (four) times daily as needed (for pain .).  Marland Kitchen fluocinonide cream (LIDEX) 0.05 % Apply topically daily as needed.  Use sparingly.  Marland Kitchen levothyroxine  (SYNTHROID, LEVOTHROID) 75 MCG tablet Take 1 tablet (75 mcg total) by mouth daily before breakfast.  . LUMIGAN 0.01 % SOLN Place 1 drop into both eyes at bedtime.  . nitroGLYCERIN (NITROSTAT) 0.4 MG SL tablet Place 1 tablet (0.4 mg total) under the tongue every 5 (five) minutes as needed for chest pain.  Marland Kitchen omeprazole (PRILOSEC OTC) 20 MG tablet Take 20 mg by mouth daily.  Marland Kitchen REPATHA SURECLICK [id number] MG/ML SOAJ ADMINISTER 1 ML UNDER THE SKIN EVERY 14 DAYS  . solifenacin (VESICARE) 5 MG tablet Take 5 mg by mouth daily.  . tamoxifen (NOLVADEX) 20 MG tablet TAKE 1 TABLET BY MOUTH EVERY DAY  . [DISCONTINUED] topiramate (TOPAMAX) 25 MG tablet Take 25 mg by mouth 2 (two) times daily.     Allergies:   Ace inhibitors, Angiotensin receptor blockers, Statins, Flonase [fluticasone propionate], Glipizide,  Niacin, and Red dye   Social History   Socioeconomic History  . Marital status: Widowed    Spouse name: Not on file  . Number of children: 3  . Years of education: Not on file  . Highest education level: Not on file  Occupational History  . Occupation: retired, previous Engineer, manufacturing systems  Social Needs  . Financial resource strain: Not on file  . Food insecurity    Worry: Not on file    Inability: Not on file  . Transportation needs    Medical: Not on file    Non-medical: Not on file  Tobacco Use  . Smoking status: Former Smoker    Quit date: 04/03/2017    Years since quitting: 2.5  . Smokeless tobacco: Former Systems developer  . Tobacco comment: PT STATES SHE DOES NOT SMOKE.  Substance and Sexual Activity  . Alcohol use: No    Alcohol/week: 0.0 standard drinks    Comment: social  . Drug use: No  . Sexual activity: Never  Lifestyle  . Physical activity    Days per week: Not on file    Minutes per session: Not on file  . Stress: Not on file  Relationships  . Social Herbalist on phone: Not on file    Gets together: Not on file    Attends religious service: Not on file    Active member of  club or organization: Not on file    Attends meetings of clubs or organizations: Not on file    Relationship status: Not on file  Other Topics Concern  . Not on file  Social History Narrative   From [place].   3 children, one died from MI in 2005/02/22   Married 02/23/59   Husband and brother died in the same week in 2012/11/24      Patient is right handed.   Patient has hs education.   Patient drinks 1 cup of soda daily, and green tea.     Family History: The patient's family history includes Arthritis in her mother; Cancer in her brother and father; Colon cancer in her father; Diabetes in her mother; Heart disease in her son; Stomach cancer in her father. There is no history of Pancreatic cancer, Breast cancer, Esophageal cancer, or Rectal cancer.  ROS:   Please see the history of present illness.    All other systems are reviewed and are negative  EKGs/Labs/Other Studies Reviewed:    The following studies were reviewed today: Coronary angiograms, notes from PCP, neurology clinic, emergency room visit in September 2020, MRI brain  EKG:  EKG is not ordered today.  ECG from 08/08/2019 shows sinus rhythm, probable left ventricular hypertrophy with secondary repolarization abnormalities, not meaningfully changed from previous tracings Recent Labs: 08/07/2019: ALT 20; BUN 11; Creatinine, Ser 0.90; Hemoglobin 13.1; Magnesium 2.4; Platelets 310; Potassium 3.4; Sodium 141  Recent Lipid Panel    Component Value Date/Time   CHOL 132 02/01/2019 1020   TRIG 92 02/01/2019 1020   TRIG 203 10/03/2012   HDL 79 02/01/2019 1020   CHOLHDL 1.7 02/01/2019 1020   CHOLHDL 4 10/27/2017 0756   VLDL 54.0 (H) 10/27/2017 0756   LDLCALC 35 02/01/2019 1020   LDLDIRECT 154.0 10/27/2017 0756    Physical Exam:    VS:  BP 140/82 (BP Location: Left Arm, Patient Position: Sitting, Cuff Size: Normal)   Pulse 90   Temp (!) 96.6 F (35.9 C)   Ht 5\' 3"  (1.6 m)  Wt 165 lb (74.8 kg)   BMI 29.23 kg/m     Wt  Readings from Last 3 Encounters:  10/30/19 165 lb (74.8 kg)  10/02/19 167 lb (75.8 kg)  09/20/19 163 lb 1 oz (74 kg)      General: Alert, oriented x3, no distress, borderline obese Head: no evidence of trauma, PERRL, EOMI, no exophtalmos or lid lag, no myxedema, no xanthelasma; normal ears, nose and oropharynx Neck: normal jugular venous pulsations and no hepatojugular reflux; brisk carotid pulses without delay and no carotid bruits Chest: clear to auscultation, no signs of consolidation by percussion or palpation, normal fremitus, symmetrical and full respiratory excursions Cardiovascular: normal position and quality of the apical impulse, regular rhythm, normal first and second heart sounds, no murmurs, rubs or gallops Abdomen: no tenderness or distention, no masses by palpation, no abnormal pulsatility or arterial bruits, normal bowel sounds, no hepatosplenomegaly Extremities: no clubbing, cyanosis or edema; 2+ radial, ulnar and brachial pulses bilaterally; 2+ right femoral, posterior tibial and dorsalis pedis pulses; 2+ left femoral, posterior tibial and dorsalis pedis pulses; no subclavian or femoral bruits Neurological: grossly nonfocal Psych: Normal mood and affect    ASSESSMENT:    1. Coronary artery disease involving native coronary artery of native heart with other form of angina pectoris ([place])   2. Essential hypertension   3. Pure hypercholesterolemia   4. PVCs (premature ventricular contractions)   5. Pre-diabetes    PLAN:    In order of problems listed above:  1. CAD: She does have nitrate responsive chest discomfort, even though this does not occur with exercise and is infrequent.  There was a circumflex stenosis that was deemed significant by CT FFR, but not by coronary angiography.  She needs to take aspirin 81 mg daily and will resume her beta-blocker.  Needs to be on aspirin, beta-blockers, cholesterol-lowering education. 2. HTN: Not adequately controlled.  Target BP  130/80.  Resume carvedilol. 3. HLP: Excellent response to Repatha.  All lipid parameters within target range. 4. PVC: Beta-blocker will be restarted. 5. Prediabetes: Recheck hemoglobin A1c (was 6.0% a year ago).  Medication Adjustments/Labs and Tests Ordered: Current medicines are reviewed at length with the patient today.  Concerns regarding medicines are outlined above.  Orders Placed This Encounter  Procedures  . Comprehensive metabolic panel  . Lipid panel  . Hemoglobin A1c   Meds ordered this encounter  Medications  . carvedilol (COREG) 6.25 MG tablet    Sig: Take 1 tablet (6.25 mg total) by mouth 2 (two) times daily.    Dispense:  180 tablet    Refill:  3    Patient Instructions  Medication Instructions:  RESTART Carvedilol (Coreg) 6.25 twice daily  *If you need a refill on your cardiac medications before your next appointment, please call your pharmacy*  Lab Work: Your provider would like for you to have the following labs today: CMET, Lipid and A1C  If you have labs (blood work) drawn today and your tests are completely normal, you will receive your results only by: Marland Kitchen MyChart Message (if you have MyChart) OR . A paper copy in the mail If you have any lab test that is abnormal or we need to change your treatment, we will call you to review the results.  Testing/Procedures: None ordered  Follow-Up: At [facility], you and your health needs are our priority.  As part of our continuing mission to provide you with exceptional heart care, we have created designated Provider Care Teams.  These Care Teams include your primary Cardiologist (physician) and Advanced Practice Providers (APPs -  Physician Assistants and Nurse Practitioners) who all work together to provide you with the care you need, when you need it.  Your next appointment:   12 month(s)  The format for your next appointment:   In Person  Provider:   Sanda Klein, MD       Signed, Sanda Klein, MD  10/30/2019 8:56 AM    [place]

## 2019-10-30 NOTE — Telephone Encounter (Signed)
PT no show for appt today. Pt appt was at 1000am and pt showed up at 100pm. Pt r/s.

## 2019-10-30 NOTE — Patient Instructions (Signed)
Medication Instructions:  RESTART Carvedilol (Coreg) 6.25 twice daily  *If you need a refill on your cardiac medications before your next appointment, please call your pharmacy*  Lab Work: Your provider would like for you to have the following labs today: CMET, Lipid and A1C  If you have labs (blood work) drawn today and your tests are completely normal, you will receive your results only by: Marland Kitchen MyChart Message (if you have MyChart) OR . A paper copy in the mail If you have any lab test that is abnormal or we need to change your treatment, we will call you to review the results.  Testing/Procedures: None ordered  Follow-Up: At [facility], you and your health needs are our priority.  As part of our continuing mission to provide you with exceptional heart care, we have created designated Provider Care Teams.  These Care Teams include your primary Cardiologist (physician) and Advanced Practice Providers (APPs -  Physician Assistants and Nurse Practitioners) who all work together to provide you with the care you need, when you need it.  Your next appointment:   12 month(s)  The format for your next appointment:   In Person  Provider:   Sanda Klein, MD

## 2019-11-14 ENCOUNTER — Other Ambulatory Visit: Payer: Self-pay | Admitting: Family Medicine

## 2019-11-14 NOTE — Telephone Encounter (Signed)
Electronic refill request. Amitriptyline Last office visit:   09/20/2019 Last Filled:    180 tablet 3 12/05/2018  Please advise.

## 2019-11-15 ENCOUNTER — Other Ambulatory Visit: Payer: Self-pay | Admitting: Family Medicine

## 2019-11-15 NOTE — Telephone Encounter (Signed)
Sent. Thanks.   

## 2019-11-15 NOTE — Telephone Encounter (Signed)
Electronic refill request. Levothyroxine Last office visit: 09/20/2019 Last Filled:     90 tablet 3 10/15/2018  Last TSH:  07/02/2018 Please advise.

## 2019-11-17 NOTE — Telephone Encounter (Signed)
Sent. Thanks.   

## 2019-12-13 ENCOUNTER — Telehealth: Payer: Self-pay | Admitting: *Deleted

## 2019-12-13 NOTE — Telephone Encounter (Signed)
Patient left a voicemail stating that she has an appointment scheduled for the covid vaccine on 12/18/19. Patient needs to make sure that it is okay for her to take the vaccine?

## 2019-12-15 NOTE — Telephone Encounter (Signed)
Should be fine to do so.  Thanks.

## 2019-12-16 NOTE — Telephone Encounter (Signed)
Advised pt ok to get vaccine. Pt verbalized understanding.

## 2020-01-05 ENCOUNTER — Other Ambulatory Visit: Payer: Self-pay | Admitting: Oncology

## 2020-01-05 ENCOUNTER — Other Ambulatory Visit: Payer: Self-pay | Admitting: Neurology

## 2020-01-06 ENCOUNTER — Other Ambulatory Visit: Payer: Self-pay

## 2020-02-23 ENCOUNTER — Other Ambulatory Visit: Payer: Self-pay | Admitting: Oncology

## 2020-02-23 ENCOUNTER — Other Ambulatory Visit: Payer: Self-pay | Admitting: Family Medicine

## 2020-02-23 ENCOUNTER — Other Ambulatory Visit: Payer: Self-pay | Admitting: Neurology

## 2020-02-26 ENCOUNTER — Telehealth: Payer: Self-pay | Admitting: *Deleted

## 2020-02-26 NOTE — Telephone Encounter (Signed)
Please see if patient is having any claudication in her legs or chest pain.  If so then triage her.  If not then please schedule follow-up here in the clinic so we can check the pulses in her feet and talk about options.  Thanks.

## 2020-02-26 NOTE — Telephone Encounter (Signed)
Monica nurse with [facility] calls called to let Dr. Damita Dunnings know that she saw the patient yesterday. Brayton Layman stated that they go out once a year to see the patient and is required to report anything abnormal to the PCP. Brayton Layman stated that the PAD screening on patient's right foot showed 0.85 which is moderate and left foot was normal 1.08. Brayton Layman stated that Dr. Damita Dunnings will get this report within 2-4 weeks of her visit which was yesterday.Brayton Layman stated no need to call her back unless you have questions.

## 2020-02-27 NOTE — Telephone Encounter (Signed)
Phoned patient and sister says the patient is out with her daughter and is not at home.  Tried cell phone number but it went directly to VM.

## 2020-03-02 NOTE — Telephone Encounter (Signed)
Patient is not having any claudication and some chest pain but not bad.  Appointment scheduled to check pulses in feet.

## 2020-03-03 ENCOUNTER — Other Ambulatory Visit: Payer: Self-pay | Admitting: Family Medicine

## 2020-03-03 NOTE — Telephone Encounter (Signed)
Noted. Thanks.

## 2020-03-05 ENCOUNTER — Other Ambulatory Visit: Payer: Self-pay

## 2020-03-05 ENCOUNTER — Encounter: Payer: Self-pay | Admitting: Family Medicine

## 2020-03-05 ENCOUNTER — Ambulatory Visit (INDEPENDENT_AMBULATORY_CARE_PROVIDER_SITE_OTHER): Payer: Medicare Other | Admitting: Family Medicine

## 2020-03-05 VITALS — BP 184/90 | HR 96 | Temp 97.3°F | Ht 63.0 in | Wt 171.4 lb

## 2020-03-05 DIAGNOSIS — R6889 Other general symptoms and signs: Secondary | ICD-10-CM

## 2020-03-05 DIAGNOSIS — Z8639 Personal history of other endocrine, nutritional and metabolic disease: Secondary | ICD-10-CM

## 2020-03-05 DIAGNOSIS — K219 Gastro-esophageal reflux disease without esophagitis: Secondary | ICD-10-CM

## 2020-03-05 DIAGNOSIS — I10 Essential (primary) hypertension: Secondary | ICD-10-CM | POA: Diagnosis not present

## 2020-03-05 DIAGNOSIS — R739 Hyperglycemia, unspecified: Secondary | ICD-10-CM | POA: Diagnosis not present

## 2020-03-05 LAB — POCT GLYCOSYLATED HEMOGLOBIN (HGB A1C): Hemoglobin A1C: 5.7 % — AB (ref 4.0–5.6)

## 2020-03-05 MED ORDER — CARVEDILOL 6.25 MG PO TABS: 3.1250 mg | ORAL_TABLET | Freq: Two times a day (BID) | ORAL | Status: DC

## 2020-03-05 MED ORDER — FLUCONAZOLE 150 MG PO TABS: 150.0000 mg | ORAL_TABLET | Freq: Every day | ORAL | 1 refills | Status: DC | PRN

## 2020-03-05 NOTE — Patient Instructions (Addendum)
Let me know what antacid/heartburn medicine you have been taking.   Try taking 1/2 tab of carvedilol twice a day and let me know how you feel with that.  Take care.  Glad to see you.

## 2020-03-05 NOTE — Progress Notes (Signed)
This visit occurred during the SARS-CoV-2 public health emergency.  Safety protocols were in place, including screening questions prior to the visit, additional usage of staff PPE, and extensive cleaning of exam room while observing appropriate contact time as indicated for disinfecting solutions.  Hypertension:    Using medication without problems or lightheadedness: rarely lightheaded if standing too quickly.   Chest pain with exertion: see below, not with exertion.   Edema: occ BLE edema.   Short of breath: she can still do yard work.   She had stopped carvediolol.  She didn't fell well when taking it but she didn't have an allergy.   H/o DM2.  D/w pt about recheck A1c at OV today.  Not on meds currently.  A1c 5.7.  Controlled.  PAD screening on patient's right foot showed 0.85 which is moderate and left foot was normal 1.08.  She doesn't have leg pain with exercise, no claudication when she is using a Network engineer.  She has some occ B ankle swelling.  This is a chronic issue for the patient.  She doesn't get chest pain with exertion, ie working in the yard as above.  She can have chest tightness with eating.  It is better with belching.  She is off prilosec in the meantime.  She was taking an unrecalled OTC antacid.  See after visit summary.  She asked about holding diflucan rx, in case she had recurrent symptoms in the future..    Meds, vitals, and allergies reviewed.   ROS: Per HPI unless specifically indicated in ROS section   GEN: nad, alert and oriented HEENT: ncat NECK: supple w/o LA CV: rrr. PULM: ctab, no inc wob ABD: soft, +bs EXT: trace BLE edema SKIN: no acute rash Normal dorsalis pedis pulses.

## 2020-03-08 DIAGNOSIS — R6889 Other general symptoms and signs: Secondary | ICD-10-CM | POA: Insufficient documentation

## 2020-03-08 NOTE — Assessment & Plan Note (Signed)
Likely with GERD symptoms off PPI.  I want her to let me know about the over-the-counter medicine she has been taking.  She can update me and we will go from there.  She agrees.  It does not sound like she has ominous problems suggestive of symptoms related to coronary disease.

## 2020-03-08 NOTE — Assessment & Plan Note (Signed)
PAD screening on patient's right foot showed 0.85 which is moderate and left foot was normal 1.08.  She doesn't have leg pain with exercise, no claudication when she is using a Network engineer.  She has some occ B ankle swelling.  This is a chronic issue for the patient.  She has normal dorsalis pedis pulses and I would observe for now.  We will treat her hypertension to try to limit/mitigate other risk factors.  Routine cautions given to patient.

## 2020-03-08 NOTE — Assessment & Plan Note (Signed)
History of, not currently by A1c.  Discussed with patient.  Continue work on diet and exercise.

## 2020-03-08 NOTE — Assessment & Plan Note (Signed)
I asked her to restart carvedilol at 3.125 mg twice a day and see if she can tolerate that and then update me about her blood pressure.  She agrees.

## 2020-03-20 ENCOUNTER — Other Ambulatory Visit: Payer: Self-pay | Admitting: Family Medicine

## 2020-03-20 DIAGNOSIS — Z1231 Encounter for screening mammogram for malignant neoplasm of breast: Secondary | ICD-10-CM

## 2020-03-21 ENCOUNTER — Other Ambulatory Visit: Payer: Self-pay | Admitting: Family Medicine

## 2020-03-24 NOTE — Telephone Encounter (Signed)
Electronic refill request. Nystatin Last office visit:   03/05/2020 Last Filled:   08/13/2019 Please advise.

## 2020-03-26 NOTE — Telephone Encounter (Signed)
Sent. Thanks.  If not improved, then let us know.

## 2020-04-23 ENCOUNTER — Other Ambulatory Visit: Payer: Self-pay

## 2020-04-23 ENCOUNTER — Ambulatory Visit
Admission: RE | Admit: 2020-04-23 | Discharge: 2020-04-23 | Disposition: A | Discharge status: Home / Self Care | Payer: Medicare Other | Source: Ambulatory Visit | Attending: Family Medicine | Admitting: Family Medicine

## 2020-04-23 DIAGNOSIS — Z1231 Encounter for screening mammogram for malignant neoplasm of breast: Secondary | ICD-10-CM

## 2020-06-04 DIAGNOSIS — H40013 Open angle with borderline findings, low risk, bilateral: Secondary | ICD-10-CM | POA: Diagnosis not present

## 2020-06-04 DIAGNOSIS — H0288B Meibomian gland dysfunction left eye, upper and lower eyelids: Secondary | ICD-10-CM | POA: Diagnosis not present

## 2020-06-04 DIAGNOSIS — E119 Type 2 diabetes mellitus without complications: Secondary | ICD-10-CM | POA: Diagnosis not present

## 2020-06-04 DIAGNOSIS — H0288A Meibomian gland dysfunction right eye, upper and lower eyelids: Secondary | ICD-10-CM | POA: Diagnosis not present

## 2020-06-04 DIAGNOSIS — H524 Presbyopia: Secondary | ICD-10-CM | POA: Diagnosis not present

## 2020-06-04 DIAGNOSIS — H353131 Nonexudative age-related macular degeneration, bilateral, early dry stage: Secondary | ICD-10-CM | POA: Diagnosis not present

## 2020-06-05 ENCOUNTER — Other Ambulatory Visit: Payer: Self-pay | Admitting: Family Medicine

## 2020-06-08 NOTE — Telephone Encounter (Signed)
Refill request Amlodipine Medication no longer on list See note on refill showing refill had been discontinued Last office visit 03/05/20

## 2020-06-09 NOTE — Telephone Encounter (Signed)
Called pharmacy, this patient is still taking Amlodipine.  Picked up the prescription on July 3rd, 90 tabs with no refills

## 2020-06-09 NOTE — Telephone Encounter (Signed)
Please verify with patient/pharmacy.  I thought she was off this medication.  I denied in the meantime.  Thanks.

## 2020-06-10 MED ORDER — AMLODIPINE BESYLATE 5 MG PO TABS: 5.0000 mg | ORAL_TABLET | Freq: Every day | ORAL | Status: DC

## 2020-06-10 NOTE — Telephone Encounter (Signed)
If she just got the refill, then she shouldn't need the rx resent at this point.  Please verify BP meds/doses with patient.  I updated her med list in the meantime.  Thanks.

## 2020-06-11 NOTE — Telephone Encounter (Signed)
Left detailed message on voicemail to return call with information requested.

## 2020-06-12 NOTE — Telephone Encounter (Signed)
Patient says she spoke with [facility] and their policy now is that when the patient picks up their last refill of a medication, they automatically request refills from the MD office so that the refill will be available when the patient needs to pick it up again.  Patient does not need the refill right now.

## 2020-06-12 NOTE — Telephone Encounter (Signed)
[place] Night - Client Nonclinical Telephone Record AccessNurse Client [place] Night - Client Client Site [place] Physician Renford Dills - MD Contact Type Call Who Is Calling Patient / Member / Family / Caregiver Caller Name Zuri Lascala Phone Number [phone] Call Type Message Only Information Provided Reason for Call Returning a Call from the Office Initial [place] states she missed a call from [facility] about a prescription. Disp. Time Disposition Final User 06/11/2020 5:09:59 PM General Information Provided Yes Renne Crigler Call Closed By: Renne Crigler Transaction Date/Time: 06/11/2020 5:07:07 PM (ET)

## 2020-06-12 NOTE — Telephone Encounter (Signed)
Patient was contacted earlier in the day and asked about one of her prescriptions.  Patient was supposed to call the office back when she retrieved that information.

## 2020-06-14 MED ORDER — AMLODIPINE BESYLATE 5 MG PO TABS: 5.0000 mg | ORAL_TABLET | Freq: Every day | ORAL | 1 refills | Status: DC

## 2020-06-14 NOTE — Telephone Encounter (Signed)
Noted.  Thanks.  Prescription sent for pharmacy to hold.

## 2020-06-14 NOTE — Addendum Note (Signed)
Addended by: [name] on: 06/14/2020 11:56 AM   Modules accepted: Orders

## 2020-07-07 NOTE — Progress Notes (Signed)
[place]  Telephone:([phone]) [phone] Fax:(336) [phone]     ID: Kelly Kennedy DOB: 28-May-1941  MR#: [id number]  MWU#:[id number]  Patient Care Team: [name], MD as PCP - General Croitoru, Dani Gobble, MD as PCP - Cardiology (Cardiology) Jayelle Page, Virgie Dad, MD as Consulting Physician (Oncology) [name], MD as Consulting Physician (General Surgery) Crissie Reese, MD as Consulting Physician (Plastic Surgery) Bjorn Loser, MD as Consulting Physician (Urology) OTHER MD:   CHIEF COMPLAINT: Ductal carcinoma in situ  CURRENT TREATMENT: tamoxifen    INTERVAL HISTORY: Kelly Kennedy returns today for follow-up and treatment of her ductal carcinoma in situ.   She continues on tamoxifen.  She tolerates this well with no major side effects and indicates she is ready to come off that medication   REVIEW OF SYSTEMS: Kelly Kennedy tells me she was put on some medication (she does not recall the name) which caused her to lose weight and become very confused.  She was seen in the emergency room September 2020 they obtain an MRI of the brain and basically when she went off what ever medication it was she has gone back to normal although she did lose quite a bit of weight.  She says her appetite is now back.  She did receive the Pfizer vaccine and tolerated that well.  For exercise she does walks but she says people are not nice where she lives and she is not walking as much as she would like.  Detailed review of systems today was otherwise stable   BREAST CANCER HISTORY: From the original intake note:  "Kelly Kennedy" had routine screening mammography with tomography at the breast Center for 20 08/18/2015 showing possible new calcifications in the left breast. Left diagnostic mammography on 04/02/2015 showed pleomorphic calcifications measuring up to 8 mm in the upper outer quadrant. Biopsy of this area on 04/07/2015 showed ([id number]) ductal carcinoma in situ, low to  intermediate grade, estrogen receptor 97% positive, progesterone receptor 97% positive, both with strong staining intensity.  A second group of calcifications, more anterior, also in the upper outer quadrant, was biopsied 04/22/2015 showing ([id number]) ductal carcinoma in situ, grade 2, estrogen receptor 100% positive, progesterone receptor 98% positive, both with strong staining intensity.  On 05/06/2015 the patient underwent left partial mastectomy which showed intermediate grade ductal carcinoma in situ extending over 7.9 cm. Both sentinel lymph nodes were clear. Margins were less than a millimeter anteriorly and posteriorly as well as laterally.  Given the many exceedingly close margins and the cosmetic defect already present, the patient has opted for left mastectomy with immediate reconstruction.   Her subsequent history is as detailed below.   PAST MEDICAL HISTORY: Past Medical History:  Diagnosis Date  . adenomatous Colon polyps    colonoscopy 8/99, 12/02, 8/06  . Arthritis   . Bell's palsy   . Cancer [facility])    DCIS left breast  . Cataract   . Chest pain    uses NTG as needed  . Depression   . Diabetes mellitus without complication ([place])   . Diverticulosis of colon 06/2005  . Ductal carcinoma in situ (DCIS) of left breast   . Family history of adverse reaction to anesthesia    " my mother didn't wake up so they had to put her on life support for about an hour or more; my sisiter has problems waking up too from anesthesia.  Marland Kitchen GERD (gastroesophageal reflux disease)   . Glaucoma   . H/O hiatal hernia   .  Headache(784.0)   . Hyperglycemia   . Hyperlipidemia   . Hypertension   . Hypothyroid   . Insomnia   . Irritable bowel   . Menopausal symptoms   . Pericarditis 1980's  . Stroke ([place])   . TIA (transient ischemic attack)    09/2014    PAST SURGICAL HISTORY: Past Surgical History:  Procedure Laterality Date  . APPENDECTOMY    . BREAST EXCISIONAL BIOPSY Right     benign  . BREAST RECONSTRUCTION WITH PLACEMENT OF TISSUE EXPANDER AND FLEX HD (ACELLULAR HYDRATED DERMIS) Left 08/06/2015   Procedure: LEFT BREAST RECONSTRUCTION WITH PLACEMENT OF SALINE IMPLANT AND ACELLULAR DURMAL MATRIX;  Surgeon: Crissie Reese, MD;  Location: [place];  Service: Plastics;  Laterality: Left;  . CARDIAC CATHETERIZATION    . CATARACT EXTRACTION Bilateral 2016  . CHOLECYSTECTOMY  1990  . COLONOSCOPY    . CYSTECTOMY  05/04/08   left lower arm, benign  . ECTOPIC PREGNANCY SURGERY  1971  . FRACTURE SURGERY     nose  . LEFT HEART CATH AND CORONARY ANGIOGRAPHY [name] 09/21/2018   Procedure: LEFT HEART CATH AND CORONARY ANGIOGRAPHY;  Surgeon: Leonie Man, MD;  Location: [place] CV LAB;  Service: Cardiovascular;  Laterality: [name];  . MASTECTOMY Left   . NIPPLE SPARING MASTECTOMY Left 08/06/2015   Procedure: LEFT NIPPLE SPARING MASTECTOMY;  Surgeon: [name], MD;  Location: [place];  Service: General;  Laterality: Left;  . ORIF ANKLE FRACTURE Left 10/23/2013   Procedure: OPEN REDUCTION INTERNAL FIXATION (ORIF) LEFT ANKLE FRACTURE;  Surgeon: Marianna Payment, MD;  Location: [place];  Service: Orthopedics;  Laterality: Left;  . PARTIAL HYSTERECTOMY     ovaries intact  . PARTIAL MASTECTOMY WITH NEEDLE LOCALIZATION AND AXILLARY SENTINEL LYMPH NODE BX Left 05/06/2015   Procedure: LEFT BREAST PARTIAL MASTECTOMY WITH NEEDLE LOCALIZATION AND SENTINEL LYMPH NODE MAPPING;  Surgeon: [name], MD;  Location: [place];  Service: General;  Laterality: Left;  . TONSILLECTOMY    . TUBAL LIGATION      FAMILY HISTORY Family History  Problem Relation Age of Onset  . Cancer Father        stomach   . Stomach cancer Father   . Colon cancer Father   . Heart disease Son        MI  . Arthritis Mother   . Diabetes Mother   . Cancer Brother        lung cancer  . Pancreatic cancer Neg Hx   . Breast cancer Neg Hx   . Esophageal cancer Neg Hx   . Rectal cancer Neg Hx    the  patient's father died from stomach cancer at age 82. The patient's mother died from gangrene at the age of 70. The patient has one sister, 9 brothers. Her sister had breast cancer diagnosed after the age of 28. There is no history of ovarian cancer in the family.   GYNECOLOGIC HISTORY:  No LMP recorded. Patient has had a hysterectomy. Menarche age 68, first live birth age 29, the patient is GX P3. She underwent hysterectomy more than 20 years ago. She does believe her ovaries were removed at that time. She was on hormone replacement until earlier this year.   SOCIAL HISTORY:  She used to work in a [employer] but is now retired. She is widowed--her husband died Christmas Day some years ago. At home she lives with her sister [name]. One of the patient's sons died from a massive  heart attack while playing basketball at age 51.. The patient's daughter [name] is currently unemployed. The patient has 4 grandchildren and at least 31 great-grandchildren. She attends a [employer] locally    ADVANCED DIRECTIVES: Not in place   HEALTH MAINTENANCE: Social History   Tobacco Use  . Smoking status: Former Smoker    Quit date: 04/03/2017    Years since quitting: 3.2  . Smokeless tobacco: Former Systems developer  . Tobacco comment: PT STATES SHE DOES NOT SMOKE.  Vaping Use  . Vaping Use: Never used  Substance Use Topics  . Alcohol use: No    Alcohol/week: 0.0 standard drinks    Comment: social  . Drug use: No     Colonoscopy: 09/09/2010, 09/26/2013  PAP:  Bone density: 2013?/Normal per patient report  Lipid panel:  Allergies  Allergen Reactions  . Ace Inhibitors Swelling    H/o lip swelling with change in losartan pill  . Angiotensin Receptor Blockers Swelling    H/o lip swelling with change in losartan pill  . Statins Anaphylaxis    myalgias myalgias  . Flonase [Fluticasone Propionate] Other (See Comments)    Intolerant of smell  . Glipizide Other (See Comments)    unstready when on med.      . Niacin Rash  . Red Dye Rash    Current Outpatient Medications  Medication Sig Dispense Refill  . amitriptyline (ELAVIL) 50 MG tablet TAKE 1 TO 2 TABLETS BY MOUTH AT BEDTIME AS NEEDED FOR SLEEP 180 tablet 3  . amLODipine (NORVASC) 5 MG tablet Take 1 tablet (5 mg total) by mouth daily. 90 tablet 1  . Aspirin-Salicylamide-Caffeine (BC HEADACHE POWDER PO) Take 1 packet by mouth 2 (two) times daily as needed (pain).     . carvedilol (COREG) 6.25 MG tablet Take 0.5 tablets (3.125 mg total) by mouth 2 (two) times daily.    . diclofenac sodium (VOLTAREN) 1 % GEL Apply 2-4 g topically 4 (four) times daily as needed (for pain .).    Marland Kitchen fluconazole (DIFLUCAN) 150 MG tablet Take 1 tablet (150 mg total) by mouth daily as needed (yeast infection). Take weekly if needed 4 tablet 1  . fluocinonide cream (LIDEX) 0.05 % Apply topically daily as needed.  Use sparingly. 30 g 3  . levothyroxine (SYNTHROID) 75 MCG tablet TAKE 1 TABLET (75 MCG TOTAL) BY MOUTH DAILY BEFORE BREAKFAST. 90 tablet 1  . LUMIGAN 0.01 % SOLN Place 1 drop into both eyes at bedtime.    . metroNIDAZOLE (METROGEL) 1 % gel APPLY TO AFFECTED AREA EVERY DAY 60 g 3  . nitroGLYCERIN (NITROSTAT) 0.4 MG SL tablet PLACE 1 TABLET (0.4 MG TOTAL) UNDER THE TONGUE EVERY 5 (FIVE) MINUTES AS NEEDED FOR CHEST PAIN. 25 tablet 1  . nystatin (MYCOSTATIN) 100000 UNIT/ML suspension TAKE 5 MLS (500,000 UNITS TOTAL) BY MOUTH 4 (FOUR) TIMES DAILY. SWISH AND SWALLOW. 60 mL 1  . REPATHA SURECLICK 518 MG/ML SOAJ ADMINISTER 1 ML UNDER THE SKIN EVERY 14 DAYS 2 mL 11  . solifenacin (VESICARE) 5 MG tablet Take 5 mg by mouth daily.    . tamoxifen (NOLVADEX) 20 MG tablet TAKE 1 TABLET BY MOUTH EVERY DAY 90 tablet 1   No current facility-administered medications for this visit.    OBJECTIVE:  African-American woman who appears younger than stated age  62:   07/08/20 1333  BP: (!) 157/83  Pulse: 88  Resp: 20  Temp: (!) 97 F (36.1 C)  SpO2: 97%     Body  mass  index is 30.45 kg/m.    ECOG FS:0 - Asymptomatic  Sclerae unicteric, EOMs intact Wearing a mask No cervical or supraclavicular adenopathy Lungs no rales or rhonchi Heart regular rate and rhythm Abd soft, nontender, positive bowel sounds MSK no focal spinal tenderness, no upper extremity lymphedema Neuro: nonfocal, well oriented, appropriate affect Breasts: The right breast is status post reduction mammoplasty.  The left breast is status post mastectomy with implant reconstruction.  There is no evidence of disease recurrence.  Both axillae are benign.   LAB RESULTS:  CMP     Component Value Date/Time   NA 142 07/08/2020 1319   NA 144 10/30/2019 0915   NA 141 06/10/2015 1549   K 4.5 07/08/2020 1319   K 3.8 06/10/2015 1549   CL 108 07/08/2020 1319   CO2 25 07/08/2020 1319   CO2 26 06/10/2015 1549   GLUCOSE 101 (H) 07/08/2020 1319   GLUCOSE 111 06/10/2015 1549   GLUCOSE 113 (H) 12/11/2006 1253   BUN 14 07/08/2020 1319   BUN 12 10/30/2019 0915   BUN 16.6 06/10/2015 1549   CREATININE 0.91 07/08/2020 1319   CREATININE 1.3 (H) 06/10/2015 1549   CALCIUM 9.8 07/08/2020 1319   CALCIUM 9.3 06/10/2015 1549   PROT 7.1 07/08/2020 1319   PROT 6.5 10/30/2019 0915   PROT 7.0 06/10/2015 1549   ALBUMIN 3.6 07/08/2020 1319   ALBUMIN 4.0 10/30/2019 0915   ALBUMIN 3.7 06/10/2015 1549   AST 20 07/08/2020 1319   AST 30 06/10/2015 1549   ALT 10 07/08/2020 1319   ALT 31 06/10/2015 1549   ALKPHOS 48 07/08/2020 1319   ALKPHOS 66 06/10/2015 1549   BILITOT 0.3 07/08/2020 1319   BILITOT 0.2 10/30/2019 0915   BILITOT 0.30 06/10/2015 1549   GFRNONAA >60 07/08/2020 1319   GFRAA >60 07/08/2020 1319    INo results found for: SPEP, UPEP  Lab Results  Component Value Date   WBC 7.5 07/08/2020   NEUTROABS 4.0 07/08/2020   HGB 12.7 07/08/2020   HCT 39.4 07/08/2020   MCV 88.7 07/08/2020   PLT 300 07/08/2020      Chemistry      Component Value Date/Time   NA 142 07/08/2020 1319   NA 144  10/30/2019 0915   NA 141 06/10/2015 1549   K 4.5 07/08/2020 1319   K 3.8 06/10/2015 1549   CL 108 07/08/2020 1319   CO2 25 07/08/2020 1319   CO2 26 06/10/2015 1549   BUN 14 07/08/2020 1319   BUN 12 10/30/2019 0915   BUN 16.6 06/10/2015 1549   CREATININE 0.91 07/08/2020 1319   CREATININE 1.3 (H) 06/10/2015 1549      Component Value Date/Time   CALCIUM 9.8 07/08/2020 1319   CALCIUM 9.3 06/10/2015 1549   ALKPHOS 48 07/08/2020 1319   ALKPHOS 66 06/10/2015 1549   AST 20 07/08/2020 1319   AST 30 06/10/2015 1549   ALT 10 07/08/2020 1319   ALT 31 06/10/2015 1549   BILITOT 0.3 07/08/2020 1319   BILITOT 0.2 10/30/2019 0915   BILITOT 0.30 06/10/2015 1549       No results found for: LABCA2  No components found for: LABCA125  No results for input(s): INR in the last 168 hours.  Urinalysis    Component Value Date/Time   COLORURINE YELLOW 08/07/2019 1243   APPEARANCEUR CLEAR 08/07/2019 1243   LABSPEC 1.014 08/07/2019 1243   PHURINE 5.0 08/07/2019 1243   GLUCOSEU NEGATIVE 08/07/2019 1243   HGBUR NEGATIVE  08/07/2019 1243   HGBUR negative 07/16/2010 1521   BILIRUBINUR NEGATIVE 08/07/2019 1243   BILIRUBINUR neg 03/10/2016 1447   KETONESUR 5 (A) 08/07/2019 1243   PROTEINUR NEGATIVE 08/07/2019 1243   UROBILINOGEN negative 03/10/2016 1447   UROBILINOGEN 0.2 09/29/2014 0645   NITRITE NEGATIVE 08/07/2019 1243   LEUKOCYTESUR NEGATIVE 08/07/2019 1243    STUDIES: No results found.   ASSESSMENT: 79 y.o. [place] woman status post left lumpectomy and sentinel lymph node sampling 05/06/2015 for extensive ductal carcinoma in situ, grade 2, estrogen and progesterone receptor strongly positive, with both sentinel lymph nodes negative, but exceedingly close margins  (1) left nipple sparing mastectomy with immediate implant reconstruction on 08/06/2015  (2) tamoxifen for breast cancer prevention started 09/29/2015, completing five years October 2021   PLAN: Kelly Kennedy is now a little  over 5 years out from definitive surgery for her breast cancer with no evidence of disease recurrence.  This is very favorable.  She will complete 5 years of tamoxifen in 2 months.  She can stop the tamoxifen at that time.  She does not need to "tapered off".  She understands that continuing tamoxifen additional 5 years would provide her minimal to no additional risk reduction and that by taking it for 5 years she has cut her risk of breast cancer in half.  At this point I feel comfortable releasing her to her primary care physician's care.  All she will need in terms of breast cancer follow-up is her yearly screening right mammogram, and a yearly physician breast exam.  I will be glad to see Kelly Kennedy again at any time if and when the need arises but as of now are making no further routine appointments for her here.  Total encounter time 20 minutes.*   Chelsa Stout, Virgie Dad, MD  07/08/20 4:59 PM Medical Oncology and Hematology [facility] [place], [place] [address] Tel. [phone]    Fax. [phone]   I, Wilburn Mylar, am acting as scribe for Dr. Virgie Dad. Kelly Kennedy.  I, Lurline Del MD, have reviewed the above documentation for accuracy and completeness, and I agree with the above.    *Total Encounter Time as defined by the Centers for Medicare and Medicaid Services includes, in addition to the face-to-face time of a patient visit (documented in the note above) non-face-to-face time: obtaining and reviewing outside history, ordering and reviewing medications, tests or procedures, care coordination (communications with other health care professionals or caregivers) and documentation in the medical record.

## 2020-07-08 ENCOUNTER — Inpatient Hospital Stay: Payer: Medicare Other | Attending: Oncology | Admitting: Oncology

## 2020-07-08 ENCOUNTER — Other Ambulatory Visit: Payer: Self-pay

## 2020-07-08 ENCOUNTER — Inpatient Hospital Stay: Payer: Medicare Other

## 2020-07-08 VITALS — BP 157/83 | HR 88 | Temp 97.0°F | Resp 20 | Ht 63.0 in | Wt 171.9 lb

## 2020-07-08 DIAGNOSIS — Z9012 Acquired absence of left breast and nipple: Secondary | ICD-10-CM | POA: Insufficient documentation

## 2020-07-08 DIAGNOSIS — D0512 Intraductal carcinoma in situ of left breast: Secondary | ICD-10-CM | POA: Insufficient documentation

## 2020-07-08 DIAGNOSIS — Z170 Estrogen receptor positive status [ER+]: Secondary | ICD-10-CM | POA: Diagnosis not present

## 2020-07-08 DIAGNOSIS — Z79810 Long term (current) use of selective estrogen receptor modulators (SERMs): Secondary | ICD-10-CM | POA: Diagnosis not present

## 2020-07-08 DIAGNOSIS — Z800 Family history of malignant neoplasm of digestive organs: Secondary | ICD-10-CM | POA: Diagnosis not present

## 2020-07-08 DIAGNOSIS — Z8673 Personal history of transient ischemic attack (TIA), and cerebral infarction without residual deficits: Secondary | ICD-10-CM | POA: Insufficient documentation

## 2020-07-08 DIAGNOSIS — C50412 Malignant neoplasm of upper-outer quadrant of left female breast: Secondary | ICD-10-CM | POA: Diagnosis not present

## 2020-07-08 DIAGNOSIS — Z87891 Personal history of nicotine dependence: Secondary | ICD-10-CM | POA: Diagnosis not present

## 2020-07-08 LAB — CBC WITH DIFFERENTIAL/PLATELET
Abs Immature Granulocytes: 0.05 10*3/uL (ref 0.00–0.07)
Basophils Absolute: 0 10*3/uL (ref 0.0–0.1)
Basophils Relative: 0 %
Eosinophils Absolute: 0 10*3/uL (ref 0.0–0.5)
Eosinophils Relative: 0 %
HCT: 39.4 % (ref 36.0–46.0)
Hemoglobin: 12.7 g/dL (ref 12.0–15.0)
Immature Granulocytes: 1 %
Lymphocytes Relative: 35 %
Lymphs Abs: 2.6 10*3/uL (ref 0.7–4.0)
MCH: 28.6 pg (ref 26.0–34.0)
MCHC: 32.2 g/dL (ref 30.0–36.0)
MCV: 88.7 fL (ref 80.0–100.0)
Monocytes Absolute: 0.7 10*3/uL (ref 0.1–1.0)
Monocytes Relative: 9 %
Neutro Abs: 4 10*3/uL (ref 1.7–7.7)
Neutrophils Relative %: 55 %
Platelets: 300 10*3/uL (ref 150–400)
RBC: 4.44 MIL/uL (ref 3.87–5.11)
RDW: 13.9 % (ref 11.5–15.5)
WBC: 7.5 10*3/uL (ref 4.0–10.5)
nRBC: 0 % (ref 0.0–0.2)

## 2020-07-08 LAB — COMPREHENSIVE METABOLIC PANEL
ALT: 10 U/L (ref 0–44)
AST: 20 U/L (ref 15–41)
Albumin: 3.6 g/dL (ref 3.5–5.0)
Alkaline Phosphatase: 48 U/L (ref 38–126)
Anion gap: 9 (ref 5–15)
BUN: 14 mg/dL (ref 8–23)
CO2: 25 mmol/L (ref 22–32)
Calcium: 9.8 mg/dL (ref 8.9–10.3)
Chloride: 108 mmol/L (ref 98–111)
Creatinine, Ser: 0.91 mg/dL (ref 0.44–1.00)
GFR calc Af Amer: >60 mL/min (ref >60)
GFR calc non Af Amer: >60 mL/min (ref >60)
Glucose, Bld: 101 mg/dL — ABNORMAL HIGH (ref 70–99)
Potassium: 4.5 mmol/L (ref 3.5–5.1)
Sodium: 142 mmol/L (ref 135–145)
Total Bilirubin: 0.3 mg/dL (ref 0.3–1.2)
Total Protein: 7.1 g/dL (ref 6.5–8.1)

## 2020-07-09 ENCOUNTER — Telehealth: Payer: Self-pay | Admitting: Oncology

## 2020-07-09 NOTE — Telephone Encounter (Signed)
No 8/11 los. No changes made to pt's schedule.  

## 2020-08-02 ENCOUNTER — Other Ambulatory Visit: Payer: Self-pay | Admitting: Oncology

## 2020-08-02 ENCOUNTER — Other Ambulatory Visit: Payer: Self-pay | Admitting: Cardiovascular Disease

## 2020-08-28 ENCOUNTER — Other Ambulatory Visit: Payer: Self-pay | Admitting: Family Medicine

## 2020-08-31 NOTE — Telephone Encounter (Signed)
Sent. Thanks.   

## 2020-08-31 NOTE — Telephone Encounter (Signed)
Refill request Lidex Last refill 12/04/18 #30G/3 Last office visit 03/05/20

## 2020-09-07 DIAGNOSIS — H353131 Nonexudative age-related macular degeneration, bilateral, early dry stage: Secondary | ICD-10-CM | POA: Diagnosis not present

## 2020-09-07 DIAGNOSIS — E119 Type 2 diabetes mellitus without complications: Secondary | ICD-10-CM | POA: Diagnosis not present

## 2020-09-07 DIAGNOSIS — H16223 Keratoconjunctivitis sicca, not specified as Sjogren's, bilateral: Secondary | ICD-10-CM | POA: Diagnosis not present

## 2020-09-07 DIAGNOSIS — H0288A Meibomian gland dysfunction right eye, upper and lower eyelids: Secondary | ICD-10-CM | POA: Diagnosis not present

## 2020-09-07 DIAGNOSIS — H401131 Primary open-angle glaucoma, bilateral, mild stage: Secondary | ICD-10-CM | POA: Diagnosis not present

## 2020-09-09 ENCOUNTER — Other Ambulatory Visit: Payer: Self-pay | Admitting: Family Medicine

## 2020-10-06 ENCOUNTER — Other Ambulatory Visit: Payer: Self-pay | Admitting: Cardiovascular Disease

## 2020-10-07 DIAGNOSIS — H0288A Meibomian gland dysfunction right eye, upper and lower eyelids: Secondary | ICD-10-CM | POA: Diagnosis not present

## 2020-10-07 DIAGNOSIS — E119 Type 2 diabetes mellitus without complications: Secondary | ICD-10-CM | POA: Diagnosis not present

## 2020-10-07 DIAGNOSIS — H401131 Primary open-angle glaucoma, bilateral, mild stage: Secondary | ICD-10-CM | POA: Diagnosis not present

## 2020-10-07 DIAGNOSIS — H16223 Keratoconjunctivitis sicca, not specified as Sjogren's, bilateral: Secondary | ICD-10-CM | POA: Diagnosis not present

## 2020-10-07 DIAGNOSIS — H353131 Nonexudative age-related macular degeneration, bilateral, early dry stage: Secondary | ICD-10-CM | POA: Diagnosis not present

## 2020-10-27 ENCOUNTER — Other Ambulatory Visit: Payer: Self-pay

## 2020-10-27 ENCOUNTER — Encounter: Payer: Self-pay | Admitting: Family Medicine

## 2020-10-27 ENCOUNTER — Ambulatory Visit (INDEPENDENT_AMBULATORY_CARE_PROVIDER_SITE_OTHER): Payer: Medicare Other | Admitting: Family Medicine

## 2020-10-27 ENCOUNTER — Telehealth: Payer: Self-pay | Admitting: Family Medicine

## 2020-10-27 VITALS — BP 146/82 | HR 76 | Temp 98.6°F | Ht 63.0 in | Wt 171.0 lb

## 2020-10-27 DIAGNOSIS — Z853 Personal history of malignant neoplasm of breast: Secondary | ICD-10-CM | POA: Diagnosis not present

## 2020-10-27 DIAGNOSIS — Z23 Encounter for immunization: Secondary | ICD-10-CM

## 2020-10-27 DIAGNOSIS — Z8639 Personal history of other endocrine, nutritional and metabolic disease: Secondary | ICD-10-CM

## 2020-10-27 DIAGNOSIS — E039 Hypothyroidism, unspecified: Secondary | ICD-10-CM

## 2020-10-27 DIAGNOSIS — R1310 Dysphagia, unspecified: Secondary | ICD-10-CM

## 2020-10-27 DIAGNOSIS — I10 Essential (primary) hypertension: Secondary | ICD-10-CM

## 2020-10-27 DIAGNOSIS — R739 Hyperglycemia, unspecified: Secondary | ICD-10-CM

## 2020-10-27 DIAGNOSIS — E7800 Pure hypercholesterolemia, unspecified: Secondary | ICD-10-CM

## 2020-10-27 LAB — COMPREHENSIVE METABOLIC PANEL
ALT: 10 U/L (ref 0–35)
AST: 20 U/L (ref 0–37)
Albumin: 3.9 g/dL (ref 3.5–5.2)
Alkaline Phosphatase: 42 U/L (ref 39–117)
BUN: 7 mg/dL (ref 6–23)
CO2: 30 mEq/L (ref 19–32)
Calcium: 9 mg/dL (ref 8.4–10.5)
Chloride: 105 mEq/L (ref 96–112)
Creatinine, Ser: 0.77 mg/dL (ref 0.40–1.20)
GFR: 73.73 mL/min (ref >60.00)
Glucose, Bld: 97 mg/dL (ref 70–99)
Potassium: 3.8 mEq/L (ref 3.5–5.1)
Sodium: 143 mEq/L (ref 135–145)
Total Bilirubin: 0.4 mg/dL (ref 0.2–1.2)
Total Protein: 6.5 g/dL (ref 6.0–8.3)

## 2020-10-27 LAB — CBC WITH DIFFERENTIAL/PLATELET
Basophils Absolute: 0 10*3/uL (ref 0.0–0.1)
Basophils Relative: 0.7 % (ref 0.0–3.0)
Eosinophils Absolute: 0 10*3/uL (ref 0.0–0.7)
Eosinophils Relative: 0.1 % (ref 0.0–5.0)
HCT: 38.2 % (ref 36.0–46.0)
Hemoglobin: 12.8 g/dL (ref 12.0–15.0)
Lymphocytes Relative: 37 % (ref 12.0–46.0)
Lymphs Abs: 2.1 10*3/uL (ref 0.7–4.0)
MCHC: 33.5 g/dL (ref 30.0–36.0)
MCV: 87.9 fl (ref 78.0–100.0)
Monocytes Absolute: 0.4 10*3/uL (ref 0.1–1.0)
Monocytes Relative: 7.1 % (ref 3.0–12.0)
Neutro Abs: 3.2 10*3/uL (ref 1.4–7.7)
Neutrophils Relative %: 55.1 % (ref 43.0–77.0)
Platelets: 309 10*3/uL (ref 150.0–400.0)
RBC: 4.34 Mil/uL (ref 3.87–5.11)
RDW: 14.3 % (ref 11.5–15.5)
WBC: 5.7 10*3/uL (ref 4.0–10.5)

## 2020-10-27 LAB — LIPID PANEL
Cholesterol: 128 mg/dL (ref 0–200)
HDL: 78.3 mg/dL (ref >39.00)
LDL Cholesterol: 23 mg/dL (ref 0–99)
NonHDL: 49.51
Total CHOL/HDL Ratio: 2
Triglycerides: 134 mg/dL (ref 0.0–149.0)
VLDL: 26.8 mg/dL (ref 0.0–40.0)

## 2020-10-27 LAB — POCT GLYCOSYLATED HEMOGLOBIN (HGB A1C): Hemoglobin A1C: 6 % — AB (ref 4.0–5.6)

## 2020-10-27 LAB — TSH: TSH: 0.65 u[IU]/mL (ref 0.35–4.50)

## 2020-10-27 NOTE — Patient Instructions (Addendum)
Go to the lab on the way out.   If you have mychart we'll likely use that to update you.   Let me update Dr. Loletha Grayer and Dr. Fuller Plan and we'll go from there.  Take care.  Glad to see you. Let me know what medicine you are taking for heartburn.

## 2020-10-27 NOTE — Progress Notes (Signed)
This visit occurred during the SARS-CoV-2 public health emergency.  Safety protocols were in place, including screening questions prior to the visit, additional usage of staff PPE, and extensive cleaning of exam room while observing appropriate contact time as indicated for disinfecting solutions.  H/o DM2.  A1c 6.  No meds.  Discussed with patient  She had been released from the cancer clinic, d/w pt.  She is finishing tamoxifen.  She is really happy about "graduating" from the cancer clinic.  I am really happy for her.  Hypertension:    Using medication without problems or lightheadedness: yes Chest pain with exertion: she has had occ discomfort that is better with NTG.  Can happen at rest.  Not exertional- she can exert w/o sx.  She has cardiology f/u pending.  No symptoms at time of exam. Edema: mild, better by the next AM.   Short of breath: no  Elevated Cholesterol: Using medications without problems: yes Muscle aches: no Diet compliance: yes Exercise: limited recently.    Hypothyroidism.  Some occ dysphagia going on for months, intermittent.  Improved with taking liquids.  Some occ food sticking with swallowing.  Compliant with thyroid replacement.  No blood in stool.  She is taking unrecalled GERD med with some relief.  See following phone note.  Flu shot today.    PMH and SH reviewed  Meds, vitals, and allergies reviewed.   ROS: Per HPI unless specifically indicated in ROS section   GEN: nad, alert and oriented HEENT: ncat NECK: supple w/o LA CV: rrr. PULM: ctab, no inc wob ABD: soft, +bs EXT: no edema SKIN: no acute rash

## 2020-10-27 NOTE — Telephone Encounter (Signed)
Patient called back to give you the name of the medication: Omeprazole (over the counter)

## 2020-10-28 DIAGNOSIS — R1310 Dysphagia, unspecified: Secondary | ICD-10-CM | POA: Insufficient documentation

## 2020-10-28 MED ORDER — OMEPRAZOLE 20 MG PO CPDR
20.0000 mg | DELAYED_RELEASE_CAPSULE | Freq: Every day | ORAL | Status: DC
Start: 2020-10-28 — End: 2021-03-02

## 2020-10-28 NOTE — Assessment & Plan Note (Signed)
Recheck TSH pending.  See notes on labs.  Continue levothyroxine 75 mcg daily.

## 2020-10-28 NOTE — Assessment & Plan Note (Signed)
Continue amlodipine carvedilol as ordered.  I will update cardiology and GI as FYI but it appears that she is having nonexertional symptoms that could be GERD related/esophageal spasm/related to dysphagia.  See following phone note.  It turns out she has been taking omeprazole with relief.  I do not think her symptoms are cardiac in origin and she is still okay for outpatient follow-up.

## 2020-10-28 NOTE — Telephone Encounter (Signed)
Please call her back.  Her labs are fine.  If she has been taking omeprazole intermittently, then I would start taking it every day.  If she has been taking it once a day/every day then I would start taking 2/day in the meantime.  I routed this note over to Dr. Fuller Plan.  I need input from him regarding her trouble swallowing.  Thanks.

## 2020-10-28 NOTE — Assessment & Plan Note (Signed)
Continue Repatha.  She is able to tolerate that.

## 2020-10-28 NOTE — Assessment & Plan Note (Signed)
A1c 6.  Continue work on diet and exercise.  We can recheck periodically.

## 2020-10-28 NOTE — Addendum Note (Signed)
Addended by: [name] on: 10/28/2020 09:14 PM   Modules accepted: Orders

## 2020-10-28 NOTE — Assessment & Plan Note (Signed)
History of, finishing tamoxifen, release from the cancer clinic.  See above.

## 2020-10-28 NOTE — Assessment & Plan Note (Signed)
I will update cardiology and GI as FYI but it appears that she is having nonexertional symptoms that could be GERD related/esophageal spasm/related to dysphagia.  See following phone note.  It turns out she has been taking omeprazole with relief.  I do not think her symptoms are cardiac in origin and she is still okay for outpatient follow-up.  I do think she will need GI follow-up and I will route this note for help from Dr. Lynne Leader clinic.

## 2020-10-29 NOTE — Telephone Encounter (Signed)
Noted. Thanks.

## 2020-10-29 NOTE — Telephone Encounter (Signed)
Hi, Please ask her to schedule an office appt with me or one of our APPs to further evaluate. Thanks, MS

## 2020-10-29 NOTE — Telephone Encounter (Signed)
Spoke with daughter [name] regarding ,other's lab results and she understood and will also F/U with Dr. Fuller Plan

## 2020-10-29 NOTE — Telephone Encounter (Signed)
LVM for pt to cb regarding lab results

## 2020-10-30 ENCOUNTER — Telehealth: Payer: Self-pay | Admitting: Family Medicine

## 2020-10-30 ENCOUNTER — Other Ambulatory Visit: Payer: Self-pay

## 2020-10-30 MED ORDER — SOLIFENACIN SUCCINATE 5 MG PO TABS
5.0000 mg | ORAL_TABLET | Freq: Every day | ORAL | 3 refills | Status: DC
Start: 2020-10-30 — End: 2021-10-04

## 2020-10-30 NOTE — Telephone Encounter (Signed)
SOLIFENACIN sent to the pharmacy

## 2020-10-30 NOTE — Telephone Encounter (Signed)
Patient is calling in about medication refill.  She did not know the name of it. Can you please give her a call to discuss? EM

## 2020-11-10 ENCOUNTER — Telehealth (INDEPENDENT_AMBULATORY_CARE_PROVIDER_SITE_OTHER): Payer: Medicare Other | Admitting: Family Medicine

## 2020-11-10 ENCOUNTER — Other Ambulatory Visit: Payer: Self-pay

## 2020-11-10 VITALS — Ht 63.0 in | Wt 170.0 lb

## 2020-11-10 DIAGNOSIS — R059 Cough, unspecified: Secondary | ICD-10-CM

## 2020-11-10 MED ORDER — AZITHROMYCIN 250 MG PO TABS: ORAL_TABLET | ORAL | 0 refills | Status: DC

## 2020-11-10 MED ORDER — PROMETHAZINE-DM 6.25-15 MG/5ML PO SYRP: 2.5000 mL | ORAL_SOLUTION | Freq: Four times a day (QID) | ORAL | 0 refills | Status: DC | PRN

## 2020-11-10 NOTE — Progress Notes (Signed)
Interactive audio and video telecommunications were attempted between this provider and patient, however failed, due to patient having technical difficulties OR patient did not have access to video capability.  We continued and completed visit with audio only.   Virtual Visit via Telephone Note  I connected with patient on 11/10/20  at 1:10 PM  by telephone and verified that I am speaking with the correct person using two identifiers.  Location of patient: home.   Location of MD: [facility] Name of referring provider (if blank then none associated): Names per persons and role in encounter:  MD: Earlyne Iba, Patient: name listed above.    I discussed the limitations, risks, security and privacy concerns of performing an evaluation and management service by telephone and the availability of in person appointments. I also discussed with the patient that there may be a patient responsible charge related to this service. The patient expressed understanding and agreed to proceed.  CC: URI sx.   History of Present Illness:  Pt stated that she has been coughing since 11/07/2020 with yellow mucous, and headache. Pt stated that she feels like the cough is getting better. No facial pain.  ST.  No ear pain.  Pain swallowing some of the time.  Sputum is better.  Some occ wheeze but not currently.  No vomiting, no diarrhea.  No sick contacts.  She had her covid vaccines.  No loss of taste or smell.    Observations/Objective: nad Speech wnl  Assessment and Plan: Cough.   Wouldn't need CXR at this point since she is improving.  Not likely to be covid given vaccine and booster and no loss of taste/smell.  D/w pt.  Okay for outpatient f/u.  Hold zmax for now.  Use cough medicine with routine cautions, rest and fluids.  She'll update me as needed.  Routine cautions d/w pt.    Follow Up Instructions: see above.     I discussed the assessment and treatment plan with the patient. The patient was  provided an opportunity to ask questions and all were answered. The patient agreed with the plan and demonstrated an understanding of the instructions.   The patient was advised to call back or seek an in-person evaluation if the symptoms worsen or if the condition fails to improve as anticipated.  I provided 14 minutes of non-face-to-face time during this encounter.  [name], MD

## 2020-11-11 NOTE — Assessment & Plan Note (Signed)
Cough.   Wouldn't need CXR at this point since she is improving.  Not likely to be covid given vaccine and booster and no loss of taste/smell.  D/w pt.  Okay for outpatient f/u.  Hold zmax for now.  Use cough medicine with routine cautions, rest and fluids.  She'll update me as needed.  Routine cautions d/w pt.

## 2020-11-16 ENCOUNTER — Other Ambulatory Visit: Payer: Self-pay | Admitting: Oncology

## 2020-11-24 ENCOUNTER — Encounter: Payer: Self-pay | Admitting: Nurse Practitioner

## 2020-11-24 ENCOUNTER — Ambulatory Visit: Payer: Medicare Other | Admitting: Nurse Practitioner

## 2020-11-24 VITALS — BP 160/74 | HR 96 | Ht 63.0 in | Wt 172.5 lb

## 2020-11-24 DIAGNOSIS — R1310 Dysphagia, unspecified: Secondary | ICD-10-CM

## 2020-11-24 DIAGNOSIS — I25709 Atherosclerosis of coronary artery bypass graft(s), unspecified, with unspecified angina pectoris: Secondary | ICD-10-CM

## 2020-11-24 DIAGNOSIS — B370 Candidal stomatitis: Secondary | ICD-10-CM

## 2020-11-24 DIAGNOSIS — R079 Chest pain, unspecified: Secondary | ICD-10-CM

## 2020-11-24 MED ORDER — NYSTATIN 100000 UNIT/ML MT SUSP
5.0000 mL | Freq: Four times a day (QID) | OROMUCOSAL | 1 refills | Status: DC
Start: 2020-11-24 — End: 2021-03-02

## 2020-11-24 NOTE — Progress Notes (Signed)
11/24/2020 Kelly Kennedy [id number] November 30, 1940   Chief Complaint: Dysphagia   History of Present Illness: Kelly Kennedy is a 79 year old female with a past medical history of depression, breast cancer, hypertension, coronary artery disease, pericarditis, hyperlipidemia, diabetes mellitus type 2, CVA, TIA, GERD and colon polyps.  She presents to our office today for further evaluation regarding dysphagia.  She developed difficulty swallowing in early August 2021.  She describes having food gets stuck in her throat or upper esophagus which occurs approximately 2 to 3 days weekly.  She drinks water or juice and the stuck food passes down the esophagus.  She does not gag or vomit to expel the stuck food.  She is taking Omeprazole 20 mg daily.  No NSAID use.  She underwent an EGD 07/21/2005 showed a normal esophagus which was dilated with a Savary dilator to 18 mm, a small hiatal hernia and gastritis were noted.  She is passing normal formed brown bowel movement several days weekly and she also has loose stools 2 to 3 days weekly.  No rectal bleeding.  No melena.  She underwent a colonoscopy 09/2019 and 4 tubular adenomatous polyps were removed from the colon.    She has a history of coronary artery disease followed by Dr.Croitoru.  She reported having an episode of midsternal chest pain/tightness which lasted for 20 minutes approximately 4 weeks ago.  She stated this episode of chest pain was worse than her typical angina episodes.  She took a Nitroglycerin sublingual tablet and her chest pain resolved.  No further chest pain since that time.  She was last seen in office by Dr. Royann Shivers 10/30/2019.  At that time, she was having palpitations with intermittent episodes of chest tightness at rest which responded to nitroglycerin.  She was advised to continue aspirin 81 mg daily, beta-blocker and Repatha.  She is past due for her annual cardiology follow-up.    CBC Latest Ref Rng & Units  10/27/2020 07/08/2020 08/07/2019  WBC 4.0 - 10.5 K/uL 5.7 7.5 7.7  Hemoglobin 12.0 - 15.0 g/dL 27.0 62.3 76.2  Hematocrit 36.0 - 46.0 % 38.2 39.4 40.3  Platelets 150.0 - 400.0 K/uL 309.0 300 310   CMP Latest Ref Rng & Units 10/27/2020 07/08/2020 10/30/2019  Glucose 70 - 99 mg/dL 97 831(D) 98  BUN 6 - 23 mg/dL 7 14 12   Creatinine 0.40 - 1.20 mg/dL 1.76 1.60  Sodium 135 - 145 mEq/L 143 142 144  Potassium 3.5 - 5.1 mEq/L 3.8 4.5 4.9  Chloride 96 - 112 mEq/L 105 108 108(H)  CO2 19 - 32 mEq/L 30 25 22   Calcium 8.4 - 10.5 mg/dL 9.0 9.8 9.5  Total Protein 6.0 - 8.3 g/dL 6.5 7.1 6.5  Total Bilirubin 0.2 - 1.2 mg/dL 0.4 0.3 0.2  Alkaline Phos 39 - 117 U/L 42 48 68  AST 0 - 37 U/L 20 20 19   ALT 0 - 35 U/L 10 10 10     Cardiac catheterization 09/21/2018  Dist Cx lesion is 45% stenosed -angiographically moderate at best disease. Does not explain CT scan.  There is hyperdynamic left ventricular systolic function. The left ventricular ejection fraction is greater than 65% by visual estimate.  LV end diastolic pressure is normal.   Mild to moderate disease distal dominant Circumflex.  There is no culprit lesion to explain CT scan findings. Otherwise essentially normal coronary arteries and a left dominant system. Normal LVEF with hyperdynamic function.  The patient had a brief run  of PAT/SVT during injection.   Colonoscopy 10/02/2019: Four 7 to 9 mm polyps in the transverse colon and in the ascending colon, removed with a cold snare. Resected and retrieved. - Internal hemorrhoids. - The examination was otherwise normal on direct and retroflexion   Current Outpatient Medications on File Prior to Visit  Medication Sig Dispense Refill  . amitriptyline (ELAVIL) 50 MG tablet TAKE 1 TO 2 TABLETS BY MOUTH AT BEDTIME AS NEEDED FOR SLEEP 180 tablet 3  . amLODipine (NORVASC) 5 MG tablet Take 1 tablet (5 mg total) by mouth daily. 90 tablet 1  . Aspirin-Salicylamide-Caffeine (BC HEADACHE POWDER PO)  Take 1 packet by mouth 2 (two) times daily as needed (pain).     . carvedilol (COREG) 6.25 MG tablet TAKE 1 TABLET BY MOUTH TWICE A DAY 180 tablet 1  . diclofenac sodium (VOLTAREN) 1 % GEL Apply 2-4 g topically 4 (four) times daily as needed (for pain .).    Marland Kitchen fluocinonide cream (LIDEX) 0.05 % APPLY TOPICALLY DAILY AS NEEDED. USE SPARINGLY. 30 g 1  . levothyroxine (SYNTHROID) 75 MCG tablet TAKE 1 TABLET (75 MCG TOTAL) BY MOUTH DAILY BEFORE BREAKFAST. 90 tablet 1  . Lifitegrast (XIIDRA) 5 % SOLN Apply 1 drop to eye in the morning and at bedtime. Twice eyes    . LUMIGAN 0.01 % SOLN Place 1 drop into both eyes at bedtime.    . metroNIDAZOLE (METROGEL) 1 % gel APPLY TO AFFECTED AREA EVERY DAY 60 g 3  . nitrofurantoin, macrocrystal-monohydrate, (MACROBID) 100 MG capsule Take 100 mg by mouth daily. Per urology    . omeprazole (PRILOSEC) 20 MG capsule Take 1-2 capsules (20-40 mg total) by mouth daily.    . promethazine-dextromethorphan (PROMETHAZINE-DM) 6.25-15 MG/5ML syrup Take 2.5 mLs by mouth 4 (four) times daily as needed for cough (sedation caution). 118 mL 0  . REPATHA SURECLICK [id number] MG/ML SOAJ ADMINISTER 1 ML UNDER THE SKIN EVERY 14 DAYS 2 mL 11  . solifenacin (VESICARE) 5 MG tablet Take 1 tablet (5 mg total) by mouth daily. 90 tablet 3  . tamoxifen (NOLVADEX) 20 MG tablet TAKE 1 TABLET BY MOUTH EVERY DAY 90 tablet 0  . trimethoprim (TRIMPEX) 100 MG tablet Take 100 mg by mouth daily. Per urology    . azithromycin (ZITHROMAX) 250 MG tablet 2 tabs a day for 1 day and then 1 a day for 4 days.  Hold for patient to request (Patient not taking: Reported on 11/24/2020) 6 tablet 0  . fluconazole (DIFLUCAN) 150 MG tablet Take 1 tablet (150 mg total) by mouth daily as needed (yeast infection). Take weekly if needed (Patient not taking: Reported on 11/24/2020) 4 tablet 1  . nitroGLYCERIN (NITROSTAT) 0.4 MG SL tablet PLACE 1 TABLET (0.4 MG TOTAL) UNDER THE TONGUE EVERY 5 (FIVE) MINUTES AS NEEDED FOR CHEST PAIN.  (Patient not taking: Reported on 11/24/2020) 25 tablet 1   No current facility-administered medications on file prior to visit.   Allergies  Allergen Reactions  . Ace Inhibitors Swelling    H/o lip swelling with change in losartan pill  . Angiotensin Receptor Blockers Swelling    H/o lip swelling with change in losartan pill  . Statins Anaphylaxis    myalgias myalgias  . Flonase [Fluticasone Propionate] Other (See Comments)    Intolerant of smell  . Glipizide Other (See Comments)    unstready when on med.    . Sulfa Antibiotics     Intolerant of sulfa but able to tolerate trimethoprim  .  Niacin Rash  . Red Dye Rash    Current Medications, Allergies, Past Medical History, Past Surgical History, Family History and Social History were reviewed in [employer] record.   Review of Systems:   Constitutional: Negative for fever, sweats, chills or weight loss.  Respiratory: Negative for shortness of breath.   Cardiovascular: Negative for chest pain, palpitations and leg swelling.  Gastrointestinal: See HPI.  Musculoskeletal: Negative for back pain or muscle aches.  Neurological: Negative for dizziness, headaches or paresthesias.    Physical Exam: BP (!) 160/74 (BP Location: Left Arm, Patient Position: Sitting, Cuff Size: Normal)   Pulse 96   Ht 5\' 3"  (1.6 m) Comment: height measured without shoes  Wt 172 lb 8 oz (78.2 kg)   BMI 30.56 kg/m  General: 79 year old female in no acute distress. Head: Normocephalic and atraumatic. Eyes: No scleral icterus. Conjunctiva pink . Ears: Normal auditory acuity. Mouth: Dentition intact. No ulcers or lesions.  Mild oral thrush noted on tongue. Lungs: Clear throughout to auscultation. Heart: Regular rate and rhythm, no murmur. Abdomen: Soft, nontender and nondistended. No masses or hepatomegaly. Normal bowel sounds x 4 quadrants.  Rectal: Furred. Musculoskeletal: Symmetrical with no gross deformities. Extremities: No  edema. Neurological: Alert oriented x 4. No focal deficits.  Psychological: Alert and cooperative. Normal mood and affect  Assessment and Recommendations:  50.  79 year old female with dysphagia.  -Barium swallow with tablet -EGD to be scheduled after she sees her cardiologist as she is due for her annual follow-up and she had a episode of chest pain which lasted for 20 minutes approximately 4 weeks ago -Continue Omeprazole 20mg  QD -Patient to call our office if her symptoms worsen  2.  History of tubular adenomatous colon polyps -Next colonoscopy due November 2023  3.  History of coronary artery disease.  -See plan in # 1  4. Mild oral thrush on exam, may be contributing to oral dysphagia -Nystatin 100,000 iu 70ml qid swish and swallow x 14 days

## 2020-11-24 NOTE — Patient Instructions (Signed)
If you are age 78 or older, your body mass index should be between 23-30. Your Body mass index is 30.56 kg/m. If this is out of the aforementioned range listed, please consider follow up with your Primary Care Provider.  If you are age 47 or younger, your body mass index should be between 19-25. Your Body mass index is 30.56 kg/m. If this is out of the aformentioned range listed, please consider follow up with your Primary Care Provider.    You have been scheduled for a Barium Esophogram at [facility] (1st floor of the hospital) on 12/02/2020 at 11:00 AM. Please arrive at 10:30 AM for registration. Make certain not to have anything to eat or drink 3 hours prior to your test. If you need to reschedule for any reason, please contact radiology at [phone] to do so. __________________________________________________________________ A barium swallow is an examination that concentrates on views of the esophagus. This tends to be a double contrast exam (barium and two liquids which, when combined, create a gas to distend the wall of the oesophagus) or single contrast (non-ionic iodine based). The study is usually tailored to your symptoms so a good history is essential. Attention is paid during the study to the form, structure and configuration of the esophagus, looking for functional disorders (such as aspiration, dysphagia, achalasia, motility and reflux) EXAMINATION You may be asked to change into a gown, depending on the type of swallow being performed. A radiologist and radiographer will perform the procedure. The radiologist will advise you of the type of contrast selected for your procedure and direct you during the exam. You will be asked to stand, sit or lie in several different positions and to hold a small amount of fluid in your mouth before being asked to swallow while the imaging is performed .In some instances you may be asked to swallow barium coated marshmallows to assess the  motility of a solid food bolus. The exam can be recorded as a digital or video fluoroscopy procedure. POST PROCEDURE It will take 1-2 days for the barium to pass through your system. To facilitate this, it is important, unless otherwise directed, to increase your fluids for the next 24-48hrs and to resume your normal diet.  This test typically takes about 30 minutes to perform. __________________________________________________________________________________  Please schedule appointment with Cardiology prior to scheduling the endoscopy.  Please call if your symptoms worsen.  We have sent the following medications to your pharmacy for you to pick up at your convenience:  Nystatin: 1 teaspoon, swish and swallow 4 times a day for 14 days.  It was great seeing you today!  Thank you for entrusting me with your care and choosing [facility].  Alcide Evener, NP

## 2020-11-26 ENCOUNTER — Ambulatory Visit: Payer: Medicare Other | Admitting: Physician Assistant

## 2020-11-26 ENCOUNTER — Encounter: Payer: Self-pay | Admitting: Physician Assistant

## 2020-11-26 ENCOUNTER — Other Ambulatory Visit: Payer: Self-pay

## 2020-11-26 VITALS — BP 136/80 | HR 93 | Ht 63.0 in | Wt 171.8 lb

## 2020-11-26 DIAGNOSIS — E119 Type 2 diabetes mellitus without complications: Secondary | ICD-10-CM

## 2020-11-26 DIAGNOSIS — Z01810 Encounter for preprocedural cardiovascular examination: Secondary | ICD-10-CM

## 2020-11-26 DIAGNOSIS — I10 Essential (primary) hypertension: Secondary | ICD-10-CM | POA: Diagnosis not present

## 2020-11-26 DIAGNOSIS — E785 Hyperlipidemia, unspecified: Secondary | ICD-10-CM | POA: Diagnosis not present

## 2020-11-26 DIAGNOSIS — I2510 Atherosclerotic heart disease of native coronary artery without angina pectoris: Secondary | ICD-10-CM | POA: Diagnosis not present

## 2020-11-26 NOTE — Progress Notes (Signed)
Cardiology Office Note:    Date:  11/27/2020   ID:  Kelly Kennedy, DOB 11/10/1941, MRN [id number]  PCP:  [name], MD  [facility] Cardiologist:  Sanda Klein, MD  [facility] Electrophysiologist:  None   Referring MD: [name], MD   Chief Complaint  Patient presents with  . Follow-up    Seen for Dr. Sallyanne Kuster    History of Present Illness:    Kelly Kennedy is a 79 y.o. female with a hx of CAD, HTN, HLD, DM 2, PVCs and nonsustained atrial tachycardia, history of TIA and history of pericarditis.  Myoview in 2015 was normal.  Last echocardiogram performed on 12/02/2014 showed EF 50 to 55%, grade 1 DD, no significant valve issue.  She had surgery for left breast cancer.  Coronary CT performed in October 2019 showed a high-grade mid left circumflex stenosis with FFR of 0.75, coronary calcium score of 274 which placed the patient at the 76 percentile.  Subsequent cardiac catheterization performed on 09/21/2018 only showed a 45% distal left circumflex lesion, otherwise clean LAD and RCA, EF greater than 65%.  There was no culprit lesion to explain the CT findings.  She is intolerant of statins.  Hyperlipidemia has been controlled on Repatha.  She also has a history of angioedema with ACE inhibitors.  Patient was last seen by Dr. Sallyanne Kuster on 10/30/2019 at which time she has very infrequent chest discomfort that does not correspond with exertion.  Medical management was recommended.  Patient was recently evaluated by GI service on 11/16/2020 with dysphagia.  EGD was scheduled.  However patient at the time complained of a single episode of chest pain about 4 weeks ago.  She presents to cardiology service for preoperative clearance prior to the EGD. She mentions the last episode of chest pain was a very prolonged chest pain that lasted about 30 minutes right prior to Thanksgiving. She was at rest at the time. Symptom went away after a dose of nitroglycerin and has not  came back again. She has been able to do household activity without any issue. I reviewed her EKG and her case with Dr. Sallyanne Kuster, her EKG is really unchanged.  She is cleared to proceed with EGD.  We will see her back in a few months to reassess her symptoms.    Past Medical History:  Diagnosis Date  . adenomatous Colon polyps    colonoscopy 8/99, 12/02, 8/06  . Arthritis   . Bell's palsy   . Cancer [facility])    DCIS left breast  . Cataract   . Chest pain    uses NTG as needed  . Depression   . Diabetes mellitus without complication ([place])   . Diverticulosis of colon 06/2005  . Ductal carcinoma in situ (DCIS) of left breast   . Family history of adverse reaction to anesthesia    " my mother didn't wake up so they had to put her on life support for about an hour or more; my sisiter has problems waking up too from anesthesia.  Marland Kitchen GERD (gastroesophageal reflux disease)   . Glaucoma   . H/O hiatal hernia   . Headache(784.0)   . Hyperglycemia   . Hyperlipidemia   . Hypertension   . Hypothyroid   . Insomnia   . Irritable bowel   . Menopausal symptoms   . Pericarditis 1980's  . Stroke ([place])   . TIA (transient ischemic attack)    09/2014    Past Surgical History:  Procedure Laterality Date  . APPENDECTOMY    . BREAST EXCISIONAL BIOPSY Right    benign  . BREAST RECONSTRUCTION WITH PLACEMENT OF TISSUE EXPANDER AND FLEX HD (ACELLULAR HYDRATED DERMIS) Left 08/06/2015   Procedure: LEFT BREAST RECONSTRUCTION WITH PLACEMENT OF SALINE IMPLANT AND ACELLULAR DURMAL MATRIX;  Surgeon: Crissie Reese, MD;  Location: [place];  Service: Plastics;  Laterality: Left;  . CARDIAC CATHETERIZATION    . CATARACT EXTRACTION Bilateral 2016  . CHOLECYSTECTOMY  1990  . COLONOSCOPY    . CYSTECTOMY  05/04/08   left lower arm, benign  . ECTOPIC PREGNANCY SURGERY  1971  . FRACTURE SURGERY     nose  . LEFT HEART CATH AND CORONARY ANGIOGRAPHY [name] 09/21/2018   Procedure: LEFT HEART CATH AND CORONARY ANGIOGRAPHY;   Surgeon: Leonie Man, MD;  Location: [place] [facility];  Service: Cardiovascular;  Laterality: [name];  . MASTECTOMY Left   . NIPPLE SPARING MASTECTOMY Left 08/06/2015   Procedure: LEFT NIPPLE SPARING MASTECTOMY;  Surgeon: [name], MD;  Location: [place];  Service: General;  Laterality: Left;  . ORIF ANKLE FRACTURE Left 10/23/2013   Procedure: OPEN REDUCTION INTERNAL FIXATION (ORIF) LEFT ANKLE FRACTURE;  Surgeon: Marianna Payment, MD;  Location: [place];  Service: Orthopedics;  Laterality: Left;  . PARTIAL HYSTERECTOMY     ovaries intact  . PARTIAL MASTECTOMY WITH NEEDLE LOCALIZATION AND AXILLARY SENTINEL LYMPH NODE BX Left 05/06/2015   Procedure: LEFT BREAST PARTIAL MASTECTOMY WITH NEEDLE LOCALIZATION AND SENTINEL LYMPH NODE MAPPING;  Surgeon: [name], MD;  Location: [place];  Service: General;  Laterality: Left;  . TONSILLECTOMY    . TUBAL LIGATION      Current Medications: Current Meds  Medication Sig  . amitriptyline (ELAVIL) 50 MG tablet TAKE 1 TO 2 TABLETS BY MOUTH AT BEDTIME AS NEEDED FOR SLEEP  . amLODipine (NORVASC) 5 MG tablet Take 1 tablet (5 mg total) by mouth daily.  . Aspirin-Salicylamide-Caffeine (BC HEADACHE POWDER PO) Take 1 packet by mouth 2 (two) times daily as needed (pain).   . carvedilol (COREG) 6.25 MG tablet TAKE 1 TABLET BY MOUTH TWICE A DAY  . diclofenac sodium (VOLTAREN) 1 % GEL Apply 2-4 g topically 4 (four) times daily as needed (for pain .).  Marland Kitchen fluocinonide cream (LIDEX) 0.05 % APPLY TOPICALLY DAILY AS NEEDED. USE SPARINGLY.  Marland Kitchen levothyroxine (SYNTHROID) 75 MCG tablet TAKE 1 TABLET (75 MCG TOTAL) BY MOUTH DAILY BEFORE BREAKFAST.  Marland Kitchen Lifitegrast (XIIDRA) 5 % SOLN Apply 1 drop to eye in the morning and at bedtime. Twice eyes  . LUMIGAN 0.01 % SOLN Place 1 drop into both eyes at bedtime.  . metroNIDAZOLE (METROGEL) 1 % gel APPLY TO AFFECTED AREA EVERY DAY  . nitrofurantoin (MACRODANTIN) 100 MG capsule Take by mouth at bedtime.  .  nitrofurantoin, macrocrystal-monohydrate, (MACROBID) 100 MG capsule Take 100 mg by mouth daily. Per urology  . nitroGLYCERIN (NITROSTAT) 0.4 MG SL tablet PLACE 1 TABLET (0.4 MG TOTAL) UNDER THE TONGUE EVERY 5 (FIVE) MINUTES AS NEEDED FOR CHEST PAIN.  Marland Kitchen nystatin (MYCOSTATIN) 100000 UNIT/ML suspension Take 5 mLs (500,000 Units total) by mouth 4 (four) times daily. Swish and swallow.  Marland Kitchen omeprazole (PRILOSEC) 20 MG capsule Take 1-2 capsules (20-40 mg total) by mouth daily.  . promethazine-dextromethorphan (PROMETHAZINE-DM) 6.25-15 MG/5ML syrup Take 2.5 mLs by mouth 4 (four) times daily as needed for cough (sedation caution).  Marland Kitchen REPATHA SURECLICK [id number] MG/ML SOAJ ADMINISTER 1 ML UNDER THE SKIN EVERY 14 DAYS  .  solifenacin (VESICARE) 5 MG tablet Take 1 tablet (5 mg total) by mouth daily.  . tamoxifen (NOLVADEX) 20 MG tablet TAKE 1 TABLET BY MOUTH EVERY DAY  . trimethoprim (TRIMPEX) 100 MG tablet Take 100 mg by mouth daily. Per urology  . [DISCONTINUED] fluconazole (DIFLUCAN) 150 MG tablet Take 1 tablet (150 mg total) by mouth daily as needed (yeast infection). Take weekly if needed     Allergies:   Ace inhibitors, Angiotensin receptor blockers, Statins, Flonase [fluticasone propionate], Glipizide, Sulfa antibiotics, Niacin, and Red dye   Social History   Socioeconomic History  . Marital status: Widowed    Spouse name: Not on file  . Number of children: 3  . Years of education: Not on file  . Highest education level: Not on file  Occupational History  . Occupation: retired, previous Engineer, manufacturing systems  Tobacco Use  . Smoking status: Former Smoker    Quit date: 04/03/2017    Years since quitting: 3.6  . Smokeless tobacco: Former Systems developer  . Tobacco comment: PT STATES SHE DOES NOT SMOKE.  Vaping Use  . Vaping Use: Never used  Substance and Sexual Activity  . Alcohol use: No    Alcohol/week: 0.0 standard drinks    Comment: social  . Drug use: No  . Sexual activity: Never  Other Topics Concern  . Not  on file  Social History Narrative   From [place].   3 children, one died from MI in 03/12/05   Married 03/13/1959   Husband and brother died in the same week in 11-11-12      Patient is right handed.   Patient has hs education.   Patient drinks 1 cup of soda daily, and green tea.   Social Determinants of Health   Financial Resource Strain: Not on file  Food Insecurity: Not on file  Transportation Needs: Not on file  Physical Activity: Not on file  Stress: Not on file  Social Connections: Not on file     Family History: The patient's family history includes Arthritis in her mother; Cancer in her brother and father; Colon cancer in her father; Diabetes in her mother; Heart disease in her son; Stomach cancer in her father. There is no history of Pancreatic cancer, Breast cancer, Esophageal cancer, or Rectal cancer.  ROS:   Please see the history of present illness.     All other systems reviewed and are negative.  EKGs/Labs/Other Studies Reviewed:    The following studies were reviewed today:  Cath 09/21/2018  Dist Cx lesion is 45% stenosed -angiographically moderate at best disease. Does not explain CT scan.  There is hyperdynamic left ventricular systolic function. The left ventricular ejection fraction is greater than 65% by visual estimate.  LV end diastolic pressure is normal.   Mild to moderate disease distal dominant Circumflex.  There is no culprit lesion to explain CT scan findings. Otherwise essentially normal coronary arteries and a left dominant system. Normal LVEF with hyperdynamic function.  The patient had a brief run of PAT/SVT during injection.  Patient will return to short stay holding area for ongoing care. Would recommend beta-blocker.  The patient felt palpitations with PVCs and brief runs of PAT with left ventricular hemodynamics and LV gram.  She describes her chest discomfort as the symptoms noted with documented ectopy and brief PAT runs.  She will  be ready for discharge home after bedrest.  Recommend Aspirin 81mg  daily for moderate CAD.      EKG:  EKG is ordered  today.  The ekg ordered today demonstrates normal sinus rhythm, T wave inversion in the inferolateral leads, unchanged when compared to the previous EKG  Recent Labs: 10/27/2020: ALT 10; BUN 7; Creatinine, Ser 0.77; Hemoglobin 12.8; Platelets 309.0; Potassium 3.8; Sodium 143; TSH 0.65  Recent Lipid Panel    Component Value Date/Time   CHOL 128 10/27/2020 1115   CHOL 121 10/30/2019 0915   TRIG 134.0 10/27/2020 1115   TRIG 203 10/03/2012 0000   HDL 78.30 10/27/2020 1115   HDL 72 10/30/2019 0915   CHOLHDL 2 10/27/2020 1115   VLDL 26.8 10/27/2020 1115   LDLCALC 23 10/27/2020 1115   LDLCALC 26 10/30/2019 0915   LDLDIRECT 154.0 10/27/2017 0756     Risk Assessment/Calculations:       Physical Exam:    VS:  BP 136/80   Pulse 93   Ht 5\' 3"  (1.6 m)   Wt 171 lb 12.8 oz (77.9 kg)   BMI 30.43 kg/m     Wt Readings from Last 3 Encounters:  11/26/20 171 lb 12.8 oz (77.9 kg)  11/24/20 172 lb 8 oz (78.2 kg)  11/10/20 170 lb (77.1 kg)     GEN:  Well nourished, well developed in no acute distress HEENT: Normal NECK: No JVD; No carotid bruits LYMPHATICS: No lymphadenopathy CARDIAC: RRR, no murmurs, rubs, gallops RESPIRATORY:  Clear to auscultation without rales, wheezing or rhonchi  ABDOMEN: Soft, non-tender, non-distended MUSCULOSKELETAL:  No edema; No deformity  SKIN: Warm and dry NEUROLOGIC:  Alert and oriented x 3 PSYCHIATRIC:  Normal affect   ASSESSMENT:    1. Preoperative cardiovascular examination   2. Coronary artery disease involving native coronary artery of native heart without angina pectoris   3. Essential hypertension   4. Hyperlipidemia LDL goal <70   5. Controlled type 2 diabetes mellitus without complication, without long-term current use of insulin (HCC)    PLAN:    In order of problems listed above:  1. Preoperative clearance:  Upcoming EGD.  EKG is unchanged, she did have an solitary episode of chest pain a month ago however symptom appears to be atypical.  I reviewed her case with her primary cardiologist Dr. Sallyanne Kuster, at this time we do not recommend any further work-up.  She may proceed with EGD.  We will see her back in a few months for reassessment of her symptoms.  2. CAD: Continue 81 mg aspirin and Repatha.  Aspirin is not listed on the record, however she is taking it.  3. Hypertension: Blood pressure well controlled  4. Hyperlipidemia: On Repatha  5. DM2: Managed by primary care provider.   Medication Adjustments/Labs and Tests Ordered: Current medicines are reviewed at length with the patient today.  Concerns regarding medicines are outlined above.  Orders Placed This Encounter  Procedures  . EKG 12-Lead   No orders of the defined types were placed in this encounter.   Patient Instructions  Medication Instructions:  Your physician recommends that you continue on your current medications as directed. Please refer to the Current Medication list given to you today.  *If you need a refill on your cardiac medications before your next appointment, please call your pharmacy*  Lab Work: NONE ordered at this time of appointment   If you have labs (blood work) drawn today and your tests are completely normal, you will receive your results only by: Marland Kitchen MyChart Message (if you have MyChart) OR . A paper copy in the mail If you have any lab test that  is abnormal or we need to change your treatment, we will call you to review the results.  Testing/Procedures: NONE ordered at this time of appointment   Follow-Up: At [facility], you and your health needs are our priority.  As part of our continuing mission to provide you with exceptional heart care, we have created designated Provider Care Teams.  These Care Teams include your primary Cardiologist (physician) and Advanced Practice Providers (APPs -   Physician Assistants and Nurse Practitioners) who all work together to provide you with the care you need, when you need it.  We recommend signing up for the patient portal called "MyChart".  Sign up information is provided on this After Visit Summary.  MyChart is used to connect with patients for Virtual Visits (Telemedicine).  Patients are able to view lab/test results, encounter notes, upcoming appointments, etc.  Non-urgent messages can be sent to your provider as well.   To learn more about what you can do with MyChart, go to [email or web address].    Your next appointment:   3-4 month(s)  The format for your next appointment:   In Person  Provider:   Thurmon Fair, MD  Other Instructions      Signed, Azalee Course, [place]  11/27/2020 7:40 PM     [facility]

## 2020-11-26 NOTE — Patient Instructions (Signed)
Medication Instructions:  Your physician recommends that you continue on your current medications as directed. Please refer to the Current Medication list given to you today.  *If you need a refill on your cardiac medications before your next appointment, please call your pharmacy*  Lab Work: NONE ordered at this time of appointment   If you have labs (blood work) drawn today and your tests are completely normal, you will receive your results only by: . MyChart Message (if you have MyChart) OR . A paper copy in the mail If you have any lab test that is abnormal or we need to change your treatment, we will call you to review the results.  Testing/Procedures: NONE ordered at this time of appointment   Follow-Up: At [facility], you and your health needs are our priority.  As part of our continuing mission to provide you with exceptional heart care, we have created designated Provider Care Teams.  These Care Teams include your primary Cardiologist (physician) and Advanced Practice Providers (APPs -  Physician Assistants and Nurse Practitioners) who all work together to provide you with the care you need, when you need it.  We recommend signing up for the patient portal called "MyChart".  Sign up information is provided on this After Visit Summary.  MyChart is used to connect with patients for Virtual Visits (Telemedicine).  Patients are able to view lab/test results, encounter notes, upcoming appointments, etc.  Non-urgent messages can be sent to your provider as well.   To learn more about what you can do with MyChart, go to [email or web address].    Your next appointment:   3-4 month(s)  The format for your next appointment:   In Person  Provider:   Mihai Croitoru, MD  Other Instructions    

## 2020-11-27 ENCOUNTER — Encounter: Payer: Self-pay | Admitting: Physician Assistant

## 2020-11-29 NOTE — Progress Notes (Signed)
Reviewed and agree with management plan.  Britni Driscoll T. Ewelina Naves, MD FACG [facility]  

## 2020-11-30 ENCOUNTER — Telehealth: Payer: Self-pay

## 2020-11-30 NOTE — Telephone Encounter (Signed)
Patient has been cleared by Cardiology.  Please advise on getting her set up for the EGD.    Primary Cardiologist: Thurmon Fair, MD  Chart reviewed as part of pre-operative protocol coverage. Given past medical history and time since last visit, based on ACC/AHA guidelines, Kelly Kennedy would be at acceptable risk for the planned procedure without further cardiovascular testing. She may proceed with endoscopy/EGD from cardiac perspective. She may hold aspirin for 5-7 days prior to the procedure if needed.   The patient was advised that if she develops new symptoms prior to surgery to contact our office to arrange for a follow-up visit, and she verbalized understanding.  I will route this recommendation to the requesting party via Epic fax function and remove from pre-op pool.  Please call with questions.  Azalee Course, [place] 11/26/2020, 10:56 AM

## 2020-12-01 NOTE — Telephone Encounter (Signed)
Kelly Kennedy, please contact the patient  and schedule her for an EGD with Dr. Russella Dar as cardiac clearance received and reviewed. Refer to last office note for EGD orders. Thank you.

## 2020-12-01 NOTE — Telephone Encounter (Signed)
Left message for patient to call back  

## 2020-12-02 ENCOUNTER — Ambulatory Visit (HOSPITAL_COMMUNITY)
Admission: RE | Admit: 2020-12-02 | Discharge: 2020-12-02 | Disposition: A | Discharge status: Home / Self Care | Payer: Medicare Other | Source: Ambulatory Visit | Attending: Nurse Practitioner | Admitting: Nurse Practitioner

## 2020-12-02 ENCOUNTER — Other Ambulatory Visit: Payer: Self-pay

## 2020-12-02 DIAGNOSIS — R1310 Dysphagia, unspecified: Secondary | ICD-10-CM | POA: Diagnosis not present

## 2020-12-02 DIAGNOSIS — I25709 Atherosclerosis of coronary artery bypass graft(s), unspecified, with unspecified angina pectoris: Secondary | ICD-10-CM | POA: Diagnosis not present

## 2020-12-02 DIAGNOSIS — R079 Chest pain, unspecified: Secondary | ICD-10-CM | POA: Diagnosis not present

## 2020-12-02 DIAGNOSIS — B370 Candidal stomatitis: Secondary | ICD-10-CM | POA: Diagnosis not present

## 2020-12-02 DIAGNOSIS — K449 Diaphragmatic hernia without obstruction or gangrene: Secondary | ICD-10-CM | POA: Diagnosis not present

## 2020-12-02 NOTE — Telephone Encounter (Signed)
LMOM for patient to call back,also called her daughter and left a message with her as well.

## 2020-12-02 NOTE — Telephone Encounter (Signed)
Spoke to patient this morning. She is scheduled for an EGD with Dr Russella Dar on 12/17/20 with a pre visit with the nurse on 12/07/20. She has received cardiac clearance to hold aspirin 5-7 days prior. All questions answered. Patient voiced understanding

## 2020-12-04 ENCOUNTER — Telehealth: Payer: Self-pay

## 2020-12-04 NOTE — Telephone Encounter (Signed)
Left message for patient to return call.

## 2020-12-04 NOTE — Telephone Encounter (Signed)
-----   Message from Noralyn Pick, NP sent at 12/03/2020  5:22 AM EST ----- Shunta Mclaurin, pls contact the patient and let her know he barium swallow showed a small hiatal hernia otherwise was ok. She should proceed with the EGD as scheduled.  Thx

## 2020-12-16 NOTE — Telephone Encounter (Signed)
Left message for patient to call.

## 2020-12-17 ENCOUNTER — Encounter: Payer: Medicare Other | Admitting: Gastroenterology

## 2020-12-25 DIAGNOSIS — Z86000 Personal history of in-situ neoplasm of breast: Secondary | ICD-10-CM | POA: Diagnosis not present

## 2020-12-25 DIAGNOSIS — D0512 Intraductal carcinoma in situ of left breast: Secondary | ICD-10-CM | POA: Diagnosis not present

## 2020-12-25 DIAGNOSIS — Z9012 Acquired absence of left breast and nipple: Secondary | ICD-10-CM | POA: Diagnosis not present

## 2020-12-25 DIAGNOSIS — Z9010 Acquired absence of unspecified breast and nipple: Secondary | ICD-10-CM | POA: Diagnosis not present

## 2020-12-28 ENCOUNTER — Other Ambulatory Visit: Payer: Self-pay | Admitting: Surgery

## 2020-12-28 DIAGNOSIS — Z853 Personal history of malignant neoplasm of breast: Secondary | ICD-10-CM

## 2021-01-07 DIAGNOSIS — H0288A Meibomian gland dysfunction right eye, upper and lower eyelids: Secondary | ICD-10-CM | POA: Diagnosis not present

## 2021-01-07 DIAGNOSIS — H401131 Primary open-angle glaucoma, bilateral, mild stage: Secondary | ICD-10-CM | POA: Diagnosis not present

## 2021-01-07 DIAGNOSIS — H353131 Nonexudative age-related macular degeneration, bilateral, early dry stage: Secondary | ICD-10-CM | POA: Diagnosis not present

## 2021-01-07 DIAGNOSIS — H16223 Keratoconjunctivitis sicca, not specified as Sjogren's, bilateral: Secondary | ICD-10-CM | POA: Diagnosis not present

## 2021-01-07 DIAGNOSIS — E119 Type 2 diabetes mellitus without complications: Secondary | ICD-10-CM | POA: Diagnosis not present

## 2021-01-07 DIAGNOSIS — H0288B Meibomian gland dysfunction left eye, upper and lower eyelids: Secondary | ICD-10-CM | POA: Diagnosis not present

## 2021-01-18 ENCOUNTER — Other Ambulatory Visit: Payer: Self-pay

## 2021-01-18 ENCOUNTER — Ambulatory Visit
Admission: RE | Admit: 2021-01-18 | Discharge: 2021-01-18 | Disposition: A | Discharge status: Home / Self Care | Payer: Medicare Other | Source: Ambulatory Visit | Attending: Surgery | Admitting: Surgery

## 2021-01-18 DIAGNOSIS — N644 Mastodynia: Secondary | ICD-10-CM | POA: Diagnosis not present

## 2021-01-18 DIAGNOSIS — Z9882 Breast implant status: Secondary | ICD-10-CM | POA: Diagnosis not present

## 2021-01-18 DIAGNOSIS — Z853 Personal history of malignant neoplasm of breast: Secondary | ICD-10-CM

## 2021-01-18 DIAGNOSIS — Z9012 Acquired absence of left breast and nipple: Secondary | ICD-10-CM | POA: Diagnosis not present

## 2021-01-18 MED ORDER — GADOBUTROL 1 MMOL/ML IV SOLN
7.0000 mL | Freq: Once | INTRAVENOUS | Status: AC | PRN
  Administered 2021-01-18: 7 mL via INTRAVENOUS

## 2021-01-19 ENCOUNTER — Other Ambulatory Visit: Payer: Self-pay | Admitting: Family Medicine

## 2021-01-25 DIAGNOSIS — Z853 Personal history of malignant neoplasm of breast: Secondary | ICD-10-CM | POA: Diagnosis not present

## 2021-02-12 ENCOUNTER — Other Ambulatory Visit: Payer: Self-pay

## 2021-02-12 ENCOUNTER — Ambulatory Visit (INDEPENDENT_AMBULATORY_CARE_PROVIDER_SITE_OTHER): Payer: Medicare Other | Admitting: Cardiovascular Disease

## 2021-02-12 VITALS — BP 183/100 | HR 105 | Ht 63.0 in | Wt 193.0 lb

## 2021-02-12 DIAGNOSIS — I25118 Atherosclerotic heart disease of native coronary artery with other forms of angina pectoris: Secondary | ICD-10-CM | POA: Diagnosis not present

## 2021-02-12 DIAGNOSIS — E7800 Pure hypercholesterolemia, unspecified: Secondary | ICD-10-CM

## 2021-02-12 DIAGNOSIS — R7303 Prediabetes: Secondary | ICD-10-CM

## 2021-02-12 DIAGNOSIS — R4189 Other symptoms and signs involving cognitive functions and awareness: Secondary | ICD-10-CM

## 2021-02-12 DIAGNOSIS — I10 Essential (primary) hypertension: Secondary | ICD-10-CM | POA: Diagnosis not present

## 2021-02-12 MED ORDER — ASPIRIN EC 81 MG PO TBEC: 81.0000 mg | DELAYED_RELEASE_TABLET | Freq: Every day | ORAL | 3 refills | Status: DC

## 2021-02-12 MED ORDER — DONEPEZIL HCL 5 MG PO TBDP: 5.0000 mg | ORAL_TABLET | Freq: Every day | ORAL | 3 refills | Status: DC

## 2021-02-12 MED ORDER — ISOSORBIDE MONONITRATE ER 30 MG PO TB24: 30.0000 mg | ORAL_TABLET | Freq: Every day | ORAL | 3 refills | Status: DC

## 2021-02-12 MED ORDER — NITROGLYCERIN 0.4 MG SL SUBL: 0.4000 mg | SUBLINGUAL_TABLET | SUBLINGUAL | 1 refills | Status: DC | PRN

## 2021-02-12 NOTE — Progress Notes (Signed)
Cardiology Office Note:    Date:  02/17/2021   ID:  Kelly Kennedy, DOB 1941/10/16, MRN [id number]  PCP:  Kelly Ghent, MD  Cardiologist:  Kelly Klein, MD   Referring MD: Kelly Ghent, MD   No chief complaint on file.   History of Present Illness:    Kelly Kennedy is a 80 y.o. female with a hx of moderate CAD, multiple coronary risk factors (hypertension, hypercholesterolemia, type 2 diabetes mellitus), palpitations due to PVCs and nonsustained atrial tachycardia, previous TIA, probable previous pericarditis.   She continues to have occasional brief episodes of retrosternal chest tightness that occurs at rest, unpredictably, more commonly in the evenings that responds very promptly to sublingual nitroglycerin.  She has been taking nitroglycerin several times a week.  The pattern has not really changed over the last many months.  Memory problems have been worsening.  Her family points out that they have to help with her medications.  She has a history of intolerance to numerous statins but had markedly improved lipid profile on Repatha.  She has a history of angioedema with ACE inhibitors and had marked anorexia when taking amlodipine and topiramate (both medications were stopped).  She is taking Repatha and has an excellent lipid profile.  Recently she had problems with poor appetite and weakness, possibly related to medications.  Improved after stopping amlodipine and topiramate.  She has lost weight and is no longer obese.  She was intolerant to numerous statins.  She has a history of angioedema with ACE inhibitors.  October 2019 coronary CT angiogram that showed a high-grade stenosis in the mid left circumflex coronary artery, CT FFR of 0.75.  The CT also showed a calcium score of 274 which places her on the 76th percentile.  Cardiac catheterization did not confirm the presence of a single discrete severe stenosis in the LCx coronary artery:   Dist Cx lesion is  45% stenosed -angiographically moderate at best disease. Does not explain CT scan.  There is hyperdynamic left ventricular systolic function. The left ventricular ejection fraction is greater than 65% by visual estimate.  LV end diastolic pressure is normal.   Mild to moderate disease distal dominant Circumflex.  There is no culprit lesion to explain CT scan findings. Otherwise essentially normal coronary arteries and a left dominant system. Normal LVEF with hyperdynamic function.  The patient had a brief run of PAT/SVT during injection.  She had a low risk nuclear stress test in 2015 and a normal echo in 2016. She underwent surgery for cancer of the left breast.  She did not receive radiation therapy.  She does take a proton pump inhibitor for acid reflux.  Past Medical History:  Diagnosis Date  . adenomatous Colon polyps    colonoscopy 8/99, 12/02, 8/06  . Arthritis   . Bell's palsy   . Cancer [facility])    DCIS left breast  . Cataract   . Chest pain    uses NTG as needed  . Depression   . Diabetes mellitus without complication ([place])   . Diverticulosis of colon 06/2005  . Ductal carcinoma in situ (DCIS) of left breast   . Family history of adverse reaction to anesthesia    " my mother didn't wake up so they had to put her on life support for about an hour or more; my sisiter has problems waking up too from anesthesia.  Marland Kitchen GERD (gastroesophageal reflux disease)   . Glaucoma   . H/O hiatal hernia   .  Headache(784.0)   . Hyperglycemia   . Hyperlipidemia   . Hypertension   . Hypothyroid   . Insomnia   . Irritable bowel   . Menopausal symptoms   . Pericarditis 1980's  . Stroke ([place])   . TIA (transient ischemic attack)    09/2014    Past Surgical History:  Procedure Laterality Date  . APPENDECTOMY    . BREAST EXCISIONAL BIOPSY Right    benign  . BREAST RECONSTRUCTION WITH PLACEMENT OF TISSUE EXPANDER AND FLEX HD (ACELLULAR HYDRATED DERMIS) Left 08/06/2015   Procedure: LEFT  BREAST RECONSTRUCTION WITH PLACEMENT OF SALINE IMPLANT AND ACELLULAR DURMAL MATRIX;  Surgeon: Kelly Reese, MD;  Location: [place];  Service: Plastics;  Laterality: Left;  . CARDIAC CATHETERIZATION    . CATARACT EXTRACTION Bilateral 2016  . CHOLECYSTECTOMY  1990  . COLONOSCOPY    . CYSTECTOMY  05/04/08   left lower arm, benign  . ECTOPIC PREGNANCY SURGERY  1971  . FRACTURE SURGERY     nose  . LEFT HEART CATH AND CORONARY ANGIOGRAPHY [name] 09/21/2018   Procedure: LEFT HEART CATH AND CORONARY ANGIOGRAPHY;  Surgeon: Kelly Man, MD;  Location: [facility];  Service: Cardiovascular;  Laterality: [name];  . MASTECTOMY Left   . NIPPLE SPARING MASTECTOMY Left 08/06/2015   Procedure: LEFT NIPPLE SPARING MASTECTOMY;  Surgeon: Kelly Luna, MD;  Location: [place] [facility];  Service: General;  Laterality: Left;  . ORIF ANKLE FRACTURE Left 10/23/2013   Procedure: OPEN REDUCTION INTERNAL FIXATION (ORIF) LEFT ANKLE FRACTURE;  Surgeon: Kelly Payment, MD;  Location: [place];  Service: Orthopedics;  Laterality: Left;  . PARTIAL HYSTERECTOMY     ovaries intact  . PARTIAL MASTECTOMY WITH NEEDLE LOCALIZATION AND AXILLARY SENTINEL LYMPH NODE BX Left 05/06/2015   Procedure: LEFT BREAST PARTIAL MASTECTOMY WITH NEEDLE LOCALIZATION AND SENTINEL LYMPH NODE MAPPING;  Surgeon: Kelly Luna, MD;  Location: [facility];  Service: General;  Laterality: Left;  . TONSILLECTOMY    . TUBAL LIGATION      Current Medications: Current Meds  Medication Sig  . amitriptyline (ELAVIL) 50 MG tablet TAKE 1 TO 2 TABLETS BY MOUTH AT BEDTIME AS NEEDED FOR SLEEP  . amLODipine (NORVASC) 5 MG tablet TAKE 1 TABLET BY MOUTH EVERY DAY  . aspirin EC 81 MG tablet Take 1 tablet (81 mg total) by mouth daily. Swallow whole.  . Aspirin-Salicylamide-Caffeine (BC HEADACHE POWDER PO) Take 1 packet by mouth 2 (two) times daily as needed (pain).   . carvedilol (COREG) 6.25 MG tablet TAKE 1 TABLET BY MOUTH TWICE A DAY  . diclofenac sodium  (VOLTAREN) 1 % GEL Apply 2-4 g topically 4 (four) times daily as needed (for pain .).  Marland Kitchen donepezil (ARICEPT ODT) 5 MG disintegrating tablet Take 1 tablet (5 mg total) by mouth at bedtime.  . fluocinonide cream (LIDEX) 0.05 % APPLY TOPICALLY DAILY AS NEEDED. USE SPARINGLY.  Marland Kitchen isosorbide mononitrate (IMDUR) 30 MG 24 hr tablet Take 1 tablet (30 mg total) by mouth daily.  Marland Kitchen levothyroxine (SYNTHROID) 75 MCG tablet TAKE 1 TABLET (75 MCG TOTAL) BY MOUTH DAILY BEFORE BREAKFAST.  Marland Kitchen Lifitegrast (XIIDRA) 5 % SOLN Apply 1 drop to eye in the morning and at bedtime. Twice eyes  . LUMIGAN 0.01 % SOLN Place 1 drop into both eyes at bedtime.  . metroNIDAZOLE (METROGEL) 1 % gel APPLY TO AFFECTED AREA EVERY DAY  . nitrofurantoin (MACRODANTIN) 100 MG capsule Take by mouth at bedtime. (Patient not taking: Reported on 02/16/2021)  .  nitrofurantoin, macrocrystal-monohydrate, (MACROBID) 100 MG capsule Take 100 mg by mouth daily. Per urology (Patient not taking: Reported on 02/16/2021)  . nystatin (MYCOSTATIN) 100000 UNIT/ML suspension Take 5 mLs (500,000 Units total) by mouth 4 (four) times daily. Swish and swallow.  Marland Kitchen omeprazole (PRILOSEC) 20 MG capsule Take 1-2 capsules (20-40 mg total) by mouth daily.  . promethazine-dextromethorphan (PROMETHAZINE-DM) 6.25-15 MG/5ML syrup Take 2.5 mLs by mouth 4 (four) times daily as needed for cough (sedation caution).  Marland Kitchen REPATHA SURECLICK 301 MG/ML SOAJ ADMINISTER 1 ML UNDER THE SKIN EVERY 14 DAYS  . solifenacin (VESICARE) 5 MG tablet Take 1 tablet (5 mg total) by mouth daily. (Patient not taking: Reported on 02/16/2021)  . trimethoprim (TRIMPEX) 100 MG tablet Take 100 mg by mouth daily. Per urology (Patient not taking: Reported on 02/16/2021)  . [DISCONTINUED] nitroGLYCERIN (NITROSTAT) 0.4 MG SL tablet PLACE 1 TABLET (0.4 MG TOTAL) UNDER THE TONGUE EVERY 5 (FIVE) MINUTES AS NEEDED FOR CHEST PAIN.  . [DISCONTINUED] tamoxifen (NOLVADEX) 20 MG tablet TAKE 1 TABLET BY MOUTH EVERY DAY      Allergies:   Ace inhibitors, Angiotensin receptor blockers, Statins, Flonase [fluticasone propionate], Glipizide, Sulfa antibiotics, Niacin, and Red dye   Social History   Socioeconomic History  . Marital status: Widowed    Spouse name: Not on file  . Number of children: 3  . Years of education: Not on file  . Highest education level: Not on file  Occupational History  . Occupation: retired, previous Engineer, manufacturing systems  Tobacco Use  . Smoking status: Former Smoker    Quit date: 04/03/2017    Years since quitting: 3.8  . Smokeless tobacco: Former Systems developer  . Tobacco comment: PT STATES SHE DOES NOT SMOKE.  Vaping Use  . Vaping Use: Never used  Substance and Sexual Activity  . Alcohol use: No    Alcohol/week: 0.0 standard drinks  . Drug use: No  . Sexual activity: Never  Other Topics Concern  . Not on file  Social History Narrative   From [place].   3 children, one died from MI in 2005-03-05   Married 06-Mar-1959   Husband and brother died in the same week in 12/05/12      Patient is right handed.   Patient has hs education.   Patient drinks 1 cup of soda daily, and green tea.   Social Determinants of Health   Financial Resource Strain: Not on file  Food Insecurity: Not on file  Transportation Needs: Not on file  Physical Activity: Not on file  Stress: Not on file  Social Connections: Not on file     Family History: The patient's family history includes Arthritis in her mother; Cancer in her brother and father; Colon cancer in her father; Diabetes in her mother; Heart disease in her son; Stomach cancer in her father. There is no history of Pancreatic cancer, Breast cancer, Esophageal cancer, or Rectal cancer.  ROS:   Please see the history of present illness.    All other systems are reviewed and are negative  EKGs/Labs/Other Studies Reviewed:    The following studies were reviewed today:   EKG:  EKG is not ordered today.  ECG from 11/26/2020 is personally reviewed and  demonstrates no changes from previous tracings: Sinus rhythm, rare PVCs, LVH with secondary repolarization changes  recent Labs: 10/27/2020: ALT 10; BUN 7; Creatinine, Ser 0.77; Hemoglobin 12.8; Platelets 309.0; Potassium 3.8; Sodium 143; TSH 0.65  Recent Lipid Panel    Component Value Date/Time  CHOL 128 10/27/2020 1115   CHOL 121 10/30/2019 0915   TRIG 134.0 10/27/2020 1115   TRIG 203 10/03/2012 0000   HDL 78.30 10/27/2020 1115   HDL 72 10/30/2019 0915   CHOLHDL 2 10/27/2020 1115   VLDL 26.8 10/27/2020 1115   LDLCALC 23 10/27/2020 1115   LDLCALC 26 10/30/2019 0915   LDLDIRECT 154.0 10/27/2017 0756    Physical Exam:    VS:  BP (!) 183/100   Pulse (!) 105   Ht 5\' 3"  (1.6 m)   Wt 193 lb (87.5 kg)   SpO2 96%   BMI 34.19 kg/m    Rechecked BP 142/80. Wt Readings from Last 3 Encounters:  02/16/21 163 lb (73.9 kg)  02/13/21 193 lb (87.5 kg)  02/12/21 193 lb (87.5 kg)    General: Alert, oriented x3, no distress, moderately obese Head: no evidence of trauma, PERRL, EOMI, no exophtalmos or lid lag, no myxedema, no xanthelasma; normal ears, nose and oropharynx Neck: normal jugular venous pulsations and no hepatojugular reflux; brisk carotid pulses without delay and no carotid bruits Chest: clear to auscultation, no signs of consolidation by percussion or palpation, normal fremitus, symmetrical and full respiratory excursions Cardiovascular: normal position and quality of the apical impulse, regular rhythm, normal first and second heart sounds, no murmurs, rubs or gallops Abdomen: no tenderness or distention, no masses by palpation, no abnormal pulsatility or arterial bruits, normal bowel sounds, no hepatosplenomegaly Extremities: no clubbing, cyanosis or edema; 2+ radial, ulnar and brachial pulses bilaterally; 2+ right femoral, posterior tibial and dorsalis pedis pulses; 2+ left femoral, posterior tibial and dorsalis pedis pulses; no subclavian or femoral bruits Neurological: grossly  nonfocal Psych: Normal mood and affect   ASSESSMENT:    1. Coronary artery disease involving native coronary artery of native heart with other form of angina pectoris ([place])   2. Essential hypertension   3. Pure hypercholesterolemia   4. Pre-diabetes   5. Cognitive deficits    PLAN:    In order of problems listed above:  1. CAD: She is using quite a large amount of sublingual nitroglycerin tablets I think she will benefit from long-acting nitrates.  She is already on amlodipine.  She has a circumflex stenosis that was deemed significant by CT FFR but not by invasive coronary angiography.  Cannot exclude superimposed vasospasm.  Continue lipid-lowering therapy, beta-blockers, aspirin.  Instead repeat cardiac catheterization if we cannot control her symptoms with medical therapy.  Described the difference between stable and unstable angina and when she should seek emergency medical care (chest discomfort lasting 130 minutes, no relief with 3 consecutive sublingual nitroglycerin every 5 minutes). 2. HTN: Not adequately controlled.  Target BP 130/80.  Reminded them not to skip with the carvedilol, since this can cause rebound hypertension. 3. HLP: Excellent response to Repatha.  All lipid parameters within target range. 4. Prediabetes: Most recent hemoglobin A1c 6% in November. 5. Cognitive deficits: Worth a try to start treatment with low-dose donepezil, follow-up with neurologist or PCP.  Medication Adjustments/Labs and Tests Ordered: Current medicines are reviewed at length with the patient today.  Concerns regarding medicines are outlined above.  No orders of the defined types were placed in this encounter.  Meds ordered this encounter  Medications  . donepezil (ARICEPT ODT) 5 MG disintegrating tablet    Sig: Take 1 tablet (5 mg total) by mouth at bedtime.    Dispense:  30 tablet    Refill:  3  . aspirin EC 81 MG tablet  Sig: Take 1 tablet (81 mg total) by mouth daily. Swallow  whole.    Dispense:  90 tablet    Refill:  3  . isosorbide mononitrate (IMDUR) 30 MG 24 hr tablet    Sig: Take 1 tablet (30 mg total) by mouth daily.    Dispense:  90 tablet    Refill:  3  . nitroGLYCERIN (NITROSTAT) 0.4 MG SL tablet    Sig: Place 1 tablet (0.4 mg total) under the tongue every 5 (five) minutes as needed for chest pain.    Dispense:  25 tablet    Refill:  1    Patient Instructions  Medication Instructions:  START Aspirin 81 mg once daily START IMDUR 30 mg once daily START Aricept 5 mg once daily  *If you need a refill on your cardiac medications before your next appointment, please call your pharmacy*   Lab Work: None ordered If you have labs (blood work) drawn today and your tests are completely normal, you will receive your results only by: Marland Kitchen MyChart Message (if you have MyChart) OR . A paper copy in the mail If you have any lab test that is abnormal or we need to change your treatment, we will call you to review the results.   Testing/Procedures: None ordered   Follow-Up: At [facility], you and your health needs are our priority.  As part of our continuing mission to provide you with exceptional heart care, we have created designated Provider Care Teams.  These Care Teams include your primary Cardiologist (physician) and Advanced Practice Providers (APPs -  Physician Assistants and Nurse Practitioners) who all work together to provide you with the care you need, when you need it.  We recommend signing up for the patient portal called "MyChart".  Sign up information is provided on this After Visit Summary.  MyChart is used to connect with patients for Virtual Visits (Telemedicine).  Patients are able to view lab/test results, encounter notes, upcoming appointments, etc.  Non-urgent messages can be sent to your provider as well.   To learn more about what you can do with MyChart, go to [email or web address].    Your next appointment:   3 month(s)  The  format for your next appointment:   In Person  Provider:   You may see Kelly Klein, MD or one of the following Advanced Practice Providers on your designated Care Team:    Almyra Deforest, PA-C  Fabian Sharp, [place] or   Roby Lofts, PA-C      Signed, Kelly Klein, MD  02/17/2021 3:53 PM    [place]

## 2021-02-12 NOTE — Patient Instructions (Addendum)
Medication Instructions:  START Aspirin 81 mg once daily START IMDUR 30 mg once daily START Aricept 5 mg once daily  *If you need a refill on your cardiac medications before your next appointment, please call your pharmacy*   Lab Work: None ordered If you have labs (blood work) drawn today and your tests are completely normal, you will receive your results only by: Marland Kitchen MyChart Message (if you have MyChart) OR . A paper copy in the mail If you have any lab test that is abnormal or we need to change your treatment, we will call you to review the results.   Testing/Procedures: None ordered   Follow-Up: At [facility], you and your health needs are our priority.  As part of our continuing mission to provide you with exceptional heart care, we have created designated Provider Care Teams.  These Care Teams include your primary Cardiologist (physician) and Advanced Practice Providers (APPs -  Physician Assistants and Nurse Practitioners) who all work together to provide you with the care you need, when you need it.  We recommend signing up for the patient portal called "MyChart".  Sign up information is provided on this After Visit Summary.  MyChart is used to connect with patients for Virtual Visits (Telemedicine).  Patients are able to view lab/test results, encounter notes, upcoming appointments, etc.  Non-urgent messages can be sent to your provider as well.   To learn more about what you can do with MyChart, go to [email or web address].    Your next appointment:   3 month(s)  The format for your next appointment:   In Person  Provider:   You may see Sanda Klein, MD or one of the following Advanced Practice Providers on your designated Care Team:    Almyra Deforest, PA-C  Fabian Sharp, PA-C or   Roby Lofts, [place]

## 2021-02-13 ENCOUNTER — Other Ambulatory Visit: Payer: Self-pay

## 2021-02-13 ENCOUNTER — Emergency Department (HOSPITAL_COMMUNITY)
Admission: EM | Admit: 2021-02-13 | Discharge: 2021-02-13 | Disposition: A | Discharge status: Home / Self Care | Payer: Medicare Other | Attending: Emergency Medicine | Admitting: Emergency Medicine

## 2021-02-13 ENCOUNTER — Encounter (HOSPITAL_COMMUNITY): Payer: Self-pay

## 2021-02-13 DIAGNOSIS — Z79899 Other long term (current) drug therapy: Secondary | ICD-10-CM | POA: Insufficient documentation

## 2021-02-13 DIAGNOSIS — I10 Essential (primary) hypertension: Secondary | ICD-10-CM | POA: Insufficient documentation

## 2021-02-13 DIAGNOSIS — Z87891 Personal history of nicotine dependence: Secondary | ICD-10-CM | POA: Insufficient documentation

## 2021-02-13 DIAGNOSIS — Z7982 Long term (current) use of aspirin: Secondary | ICD-10-CM | POA: Diagnosis not present

## 2021-02-13 DIAGNOSIS — Z853 Personal history of malignant neoplasm of breast: Secondary | ICD-10-CM | POA: Diagnosis not present

## 2021-02-13 DIAGNOSIS — N611 Abscess of the breast and nipple: Secondary | ICD-10-CM | POA: Insufficient documentation

## 2021-02-13 DIAGNOSIS — E039 Hypothyroidism, unspecified: Secondary | ICD-10-CM | POA: Insufficient documentation

## 2021-02-13 DIAGNOSIS — E119 Type 2 diabetes mellitus without complications: Secondary | ICD-10-CM | POA: Insufficient documentation

## 2021-02-13 MED ORDER — OXYCODONE HCL 5 MG PO TABS: 2.5000 mg | ORAL_TABLET | Freq: Four times a day (QID) | ORAL | 0 refills | Status: DC | PRN

## 2021-02-13 MED ORDER — DOXYCYCLINE HYCLATE 100 MG PO TABS
100.0000 mg | ORAL_TABLET | Freq: Once | ORAL | Status: AC
  Administered 2021-02-13: 100 mg via ORAL
  Filled 2021-02-13: qty 1

## 2021-02-13 MED ORDER — BACITRACIN ZINC 500 UNIT/GM EX OINT
TOPICAL_OINTMENT | CUTANEOUS | Status: AC
  Filled 2021-02-13: qty 0.9

## 2021-02-13 MED ORDER — OXYCODONE HCL 5 MG PO TABS
5.0000 mg | ORAL_TABLET | Freq: Once | ORAL | Status: AC
  Administered 2021-02-13: 5 mg via ORAL
  Filled 2021-02-13: qty 1

## 2021-02-13 MED ORDER — DOXYCYCLINE HYCLATE 100 MG PO CAPS: 100.0000 mg | ORAL_CAPSULE | Freq: Two times a day (BID) | ORAL | 0 refills | Status: DC

## 2021-02-13 NOTE — Discharge Instructions (Signed)
Please read and follow all provided instructions.  Your diagnoses today include:  1. Breast abscess     Tests performed today include:  Vital signs. See below for your results today.   Medications prescribed:   Doxycycline - antibiotic  You have been prescribed an antibiotic medicine: take the entire course of medicine even if you are feeling better. Stopping early can cause the antibiotic not to work.  Take any prescribed medications only as directed.   Home care instructions:   Follow any educational materials contained in this packet  Follow-up instructions: Please follow-up with your doctor in the next 48 to 72 hours for recheck.  Return instructions:  Return to the Emergency Department if you have:  Fever  Worsening symptoms  Worsening pain  Worsening swelling  Redness of the skin that moves away from the affected area, especially if it streaks away from the affected area   Any other emergent concerns  Your vital signs today were: BP 101/63 (BP Location: Left Arm)   Pulse 94   Temp 98.7 F (37.1 C) (Oral)   Resp 14   Ht 5\' 3"  (1.6 m)   Wt 87.5 kg   SpO2 94%   BMI 34.19 kg/m  If your blood pressure (BP) was elevated above 135/85 this visit, please have this repeated by your doctor within one month. --------------

## 2021-02-13 NOTE — ED Provider Notes (Addendum)
[facility] DEPT Provider Note   CSN: [id number] Arrival date & time: 02/13/21  1452     History Chief Complaint  Patient presents with   breast drainage    Kelly Kennedy is a 80 y.o. female.  Patient with history of breast cancer, status post mastectomy on the left --presents to the emergency department today for evaluation of drainage from the right breast.  Patient first noted a little bit of drainage 3 days ago.  This morning she saw greenish colored drainage with a foul odor, prompting emergency department visit today.  Patient otherwise denies fevers, nausea or vomiting.  She is eating and drinking well and otherwise feels well.  No treatments prior to arrival.  Patient was having pain in her left breast prompting MRI performed 4 weeks ago.  On imaging, right breast was without any abnormality.        Past Medical History:  Diagnosis Date   adenomatous Colon polyps    colonoscopy 8/99, 12/02, 8/06   Arthritis    Bell's palsy    Cancer (HCC)    DCIS left breast   Cataract    Chest pain    uses NTG as needed   Depression    Diabetes mellitus without complication ([place])    Diverticulosis of colon 06/2005   Ductal carcinoma in situ (DCIS) of left breast    Family history of adverse reaction to anesthesia    " my mother didn't wake up so they had to put her on life support for about an hour or more; my sisiter has problems waking up too from anesthesia.   GERD (gastroesophageal reflux disease)    Glaucoma    H/O hiatal hernia    Headache(784.0)    Hyperglycemia    Hyperlipidemia    Hypertension    Hypothyroid    Insomnia    Irritable bowel    Menopausal symptoms    Pericarditis 1980's   Stroke [facility])    TIA (transient ischemic attack)    09/2014    Patient Active Problem List   Diagnosis Date Noted   Dysphagia 10/28/2020   Abnormality on screening test 03/08/2020   PVCs (premature  ventricular contractions) 10/11/2018   Coronary artery disease 09/12/2018   Pulmonary nodule 08/08/2018   Fatty liver 08/05/2018   Skin lesion 04/20/2017   Abnormal prominence of clavicle 03/24/2017   Cough 03/24/2017   SUI (stress urinary incontinence, female) 12/20/2016   Neoplasm of left breast, primary tumor staging category Tis: ductal carcinoma in situ (DCIS) 08/06/2015   Malignant neoplasm of upper-outer quadrant of left breast in female, estrogen receptor positive ([place]) 06/10/2015   History of breast cancer 05/14/2015   HLD (hyperlipidemia) 12/11/2014   History of diabetes mellitus 12/11/2014   Vaginitis and vulvovaginitis 10/10/2014   Paresthesia 09/28/2014   Essential hypertension 09/28/2014   ? History of TIA (transient ischemic attack) 09/28/2014   Osteopenia 07/09/2014   Advance care planning 07/06/2014   Obesity, unspecified 07/03/2013   Anxiety state, unspecified 07/03/2013   Medicare annual wellness visit, subsequent 05/27/2012   MENOPAUSAL SYNDROME 06/15/2010   Insomnia 06/15/2010   GERD 09/16/2008   Hypothyroidism 08/02/2007   DEPENDENT EDEMA 88/41/6606   SYSTOLIC MURMUR 30/16/0109   ANGINA, HX OF 07/17/2007   DIVERTICULOSIS, COLON W/O HEM 06/28/2005    Past Surgical History:  Procedure Laterality Date   APPENDECTOMY     BREAST EXCISIONAL BIOPSY Right    benign   BREAST RECONSTRUCTION WITH PLACEMENT OF TISSUE  EXPANDER AND FLEX HD (ACELLULAR HYDRATED DERMIS) Left 08/06/2015   Procedure: LEFT BREAST RECONSTRUCTION WITH PLACEMENT OF SALINE IMPLANT AND ACELLULAR DURMAL MATRIX;  Surgeon: Crissie Reese, MD;  Location: [facility];  Service: Plastics;  Laterality: Left;   CARDIAC CATHETERIZATION     CATARACT EXTRACTION Bilateral 2016   CHOLECYSTECTOMY  1990   COLONOSCOPY     CYSTECTOMY  05/04/08   left lower arm, benign   ECTOPIC PREGNANCY SURGERY  1971   FRACTURE SURGERY     nose   LEFT HEART CATH AND CORONARY ANGIOGRAPHY [name]  09/21/2018   Procedure: LEFT HEART CATH AND CORONARY ANGIOGRAPHY;  Surgeon: Leonie Man, MD;  Location: [facility];  Service: Cardiovascular;  Laterality: [name];   MASTECTOMY Left    NIPPLE SPARING MASTECTOMY Left 08/06/2015   Procedure: LEFT NIPPLE SPARING MASTECTOMY;  Surgeon: [name], MD;  Location: [facility];  Service: General;  Laterality: Left;   ORIF ANKLE FRACTURE Left 10/23/2013   Procedure: OPEN REDUCTION INTERNAL FIXATION (ORIF) LEFT ANKLE FRACTURE;  Surgeon: Marianna Payment, MD;  Location: [facility];  Service: Orthopedics;  Laterality: Left;   PARTIAL HYSTERECTOMY     ovaries intact   PARTIAL MASTECTOMY WITH NEEDLE LOCALIZATION AND AXILLARY SENTINEL LYMPH NODE BX Left 05/06/2015   Procedure: LEFT BREAST PARTIAL MASTECTOMY WITH NEEDLE LOCALIZATION AND SENTINEL LYMPH NODE MAPPING;  Surgeon: [name], MD;  Location: [facility];  Service: General;  Laterality: Left;   TONSILLECTOMY     TUBAL LIGATION       OB History   No obstetric history on file.     Family History  Problem Relation Age of Onset   Cancer Father        stomach    Stomach cancer Father    Colon cancer Father    Heart disease Son        MI   Arthritis Mother    Diabetes Mother    Cancer Brother        lung cancer   Pancreatic cancer Neg Hx    Breast cancer Neg Hx    Esophageal cancer Neg Hx    Rectal cancer Neg Hx     Social History   Tobacco Use   Smoking status: Former Smoker    Quit date: 04/03/2017    Years since quitting: 3.8   Smokeless tobacco: Former Systems developer   Tobacco comment: PT STATES [place].  Vaping Use   Vaping Use: Never used  Substance Use Topics   Alcohol use: No    Alcohol/week: 0.0 standard drinks   Drug use: No    Home Medications Prior to Admission medications   Medication Sig Start Date End Date Taking? Authorizing Provider  amitriptyline (ELAVIL) 50 MG tablet TAKE 1 TO 2 TABLETS BY MOUTH AT BEDTIME AS  NEEDED FOR SLEEP 09/10/20   [name], MD  amLODipine (NORVASC) 5 MG tablet TAKE 1 TABLET BY MOUTH EVERY DAY 01/20/21   [name], MD  aspirin EC 81 MG tablet Take 1 tablet (81 mg total) by mouth daily. Swallow whole. 02/12/21   Croitoru, Mihai, MD  Aspirin-Salicylamide-Caffeine (BC HEADACHE POWDER PO) Take 1 packet by mouth 2 (two) times daily as needed (pain).     [provider]  carvedilol (COREG) 6.25 MG tablet TAKE 1 TABLET BY MOUTH TWICE A DAY 08/04/20   Croitoru, Mihai, MD  diclofenac sodium (VOLTAREN) 1 % GEL Apply 2-4 g topically 4 (four) times daily  as needed (for pain .). 10/15/18   [name], MD  donepezil (ARICEPT ODT) 5 MG disintegrating tablet Take 1 tablet (5 mg total) by mouth at bedtime. 02/12/21   Croitoru, Mihai, MD  fluocinonide cream (LIDEX) 0.05 % APPLY TOPICALLY DAILY AS NEEDED. USE SPARINGLY. 08/31/20   [name], MD  isosorbide mononitrate (IMDUR) 30 MG 24 hr tablet Take 1 tablet (30 mg total) by mouth daily. 02/12/21 05/13/21  Croitoru, Mihai, MD  levothyroxine (SYNTHROID) 75 MCG tablet TAKE 1 TABLET (75 MCG TOTAL) BY MOUTH DAILY BEFORE BREAKFAST. 09/10/20   [name], MD  Lifitegrast Shirley Friar) 5 % SOLN Apply 1 drop to eye in the morning and at bedtime. Twice eyes    [provider]  LUMIGAN 0.01 % SOLN Place 1 drop into both eyes at bedtime. 06/29/19   [provider]  metroNIDAZOLE (METROGEL) 1 % gel APPLY TO AFFECTED AREA EVERY DAY 11/16/20   Magrinat, Virgie Dad, MD  nitrofurantoin (MACRODANTIN) 100 MG capsule Take by mouth at bedtime. 10/26/20   [provider]  nitrofurantoin, macrocrystal-monohydrate, (MACROBID) 100 MG capsule Take 100 mg by mouth daily. Per urology    [provider]  nitroGLYCERIN (NITROSTAT) 0.4 MG SL tablet Place 1 tablet (0.4 mg total) under the tongue every 5 (five) minutes as needed for chest pain. 02/12/21   Croitoru, Mihai, MD  nystatin (MYCOSTATIN) 100000 UNIT/ML suspension  Take 5 mLs (500,000 Units total) by mouth 4 (four) times daily. Swish and swallow. 11/24/20   Noralyn Pick, NP  omeprazole (PRILOSEC) 20 MG capsule Take 1-2 capsules (20-40 mg total) by mouth daily. 10/28/20   [name], MD  promethazine-dextromethorphan (PROMETHAZINE-DM) 6.25-15 MG/5ML syrup Take 2.5 mLs by mouth 4 (four) times daily as needed for cough (sedation caution). 11/10/20   [name], MD  REPATHA SURECLICK 765 MG/ML SOAJ ADMINISTER 1 ML UNDER THE SKIN EVERY 14 DAYS 10/07/20   Croitoru, Dani Gobble, MD  solifenacin (VESICARE) 5 MG tablet Take 1 tablet (5 mg total) by mouth daily. 10/30/20   [name], MD  tamoxifen (NOLVADEX) 20 MG tablet TAKE 1 TABLET BY MOUTH EVERY DAY 11/16/20   Magrinat, Virgie Dad, MD  trimethoprim (TRIMPEX) 100 MG tablet Take 100 mg by mouth daily. Per urology    [provider]    Allergies    Ace inhibitors, Angiotensin receptor blockers, Statins, Flonase [fluticasone propionate], Glipizide, Sulfa antibiotics, Niacin, and Red dye  Review of Systems   Review of Systems  Constitutional: Negative for fever.  Gastrointestinal: Negative for nausea and vomiting.  Skin: Positive for wound. Negative for color change.       Positive for abscess.  Hematological: Negative for adenopathy.    Physical Exam Updated Vital Signs BP 101/63 (BP Location: Left Arm)    Pulse 94    Temp 98.7 F (37.1 C) (Oral)    Resp 14    Ht 5\' 3"  (1.6 m)    Wt 87.5 kg    SpO2 94%    BMI 34.19 kg/m   Physical Exam Vitals and nursing note reviewed.  Constitutional:      Appearance: She is well-developed.  HENT:     Head: Normocephalic and atraumatic.  Eyes:     Conjunctiva/sclera: Conjunctivae normal.  Pulmonary:     Effort: No respiratory distress.  Musculoskeletal:     Cervical back: Normal range of motion and neck supple.  Skin:    General: Skin is warm and dry.  Comments: On the inferomedial aspect of the patient's right breast, there is a  small, less than 1 cm opening.  Mild bleeding from this.  There is some dark green dried drainage on the overlying bandage.  No significant induration or fluctuance.  Neurological:     Mental Status: She is alert.     ED Results / Procedures / Treatments   Labs (all labs ordered are listed, but only abnormal results are displayed) Labs Reviewed - No data to display  EKG None  Radiology No results found.  Procedures Procedures   Medications Ordered in ED Medications  doxycycline (VIBRA-TABS) tablet 100 mg (has no administration in time range)    ED Course  I have reviewed the triage vital signs and the nursing notes.  Pertinent labs & imaging results that were available during my care of the patient were reviewed by me and considered in my medical decision making (see chart for details).  Patient seen and examined. Medications ordered.   Vital signs reviewed and are as follows: BP 101/63 (BP Location: Left Arm)    Pulse 94    Temp 98.7 F (37.1 C) (Oral)    Resp 14    Ht 5\' 3"  (1.6 m)    Wt 87.5 kg    SpO2 94%    BMI 34.19 kg/m   Patient discussed with and seen by Dr. Tyrone Nine.  Agrees with antibiotic treatment.  Patient has follow-up next week.  Agrees no indication for I&D at this point.  5:10 PM RN came to me prior to discharge, states patient is in a lot of pain. Warm compress applied. 5mg  oxycodone ordered. Will give small rx for home.   Patient counseled on use of narcotic pain medications. Counseled not to combine these medications with others containing tylenol. Urged not to drink alcohol, drive, or perform any other activities that requires focus while taking these medications. The patient verbalizes understanding and agrees with the plan.    MDM Rules/Calculators/A&P                          Patient with small skin lesion, recent reassuring MRI, with drainage concerning for mild infection.  No systemic symptoms of illness.  Patient looks well, nontoxic.  No vital  signs abnormalities.  Will initiate therapy with oral antibiotics and have her follow-up next week as planned.   Final Clinical Impression(s) / ED Diagnoses Final diagnoses:  Breast abscess    Rx / DC Orders ED Discharge Orders         Ordered    doxycycline (VIBRAMYCIN) 100 MG capsule  2 times daily        02/13/21 1642           Carlisle Cater, PA-C 02/13/21 1645    Carlisle Cater, PA-C 02/13/21 [place], DO 02/13/21 1717

## 2021-02-13 NOTE — ED Triage Notes (Signed)
Patient states that she had right breast surgery 5 years ago and is now having yellow and green drainage from the incisional site x 3 days.

## 2021-02-15 ENCOUNTER — Other Ambulatory Visit: Payer: Self-pay | Admitting: Oncology

## 2021-02-15 DIAGNOSIS — L02219 Cutaneous abscess of trunk, unspecified: Secondary | ICD-10-CM | POA: Diagnosis not present

## 2021-02-16 ENCOUNTER — Telehealth: Payer: Self-pay

## 2021-02-16 ENCOUNTER — Ambulatory Visit (AMBULATORY_SURGERY_CENTER): Payer: Self-pay | Admitting: *Deleted

## 2021-02-16 ENCOUNTER — Other Ambulatory Visit: Payer: Self-pay

## 2021-02-16 VITALS — Ht 63.0 in | Wt 163.0 lb

## 2021-02-16 DIAGNOSIS — R1310 Dysphagia, unspecified: Secondary | ICD-10-CM

## 2021-02-16 NOTE — Telephone Encounter (Signed)
noted 

## 2021-02-16 NOTE — Telephone Encounter (Signed)
Hello Kelly Kennedy:  I was reviewing this chart for patient's visit today.    She has an statement from cardiologist that it was "ok to hold ASA if needed 5-7 days."  Is this something she needs for the colonoscopy? I have never seen this done so wanted clarification.  Thank you

## 2021-02-16 NOTE — Progress Notes (Signed)
Pt's previsit is done over the phone and all paperwork (prep instructions, blank consent form to just read over, pre-procedure acknowledgement form and stamped envelope) sent to patient   No trouble with anesthesia, denies being told they were difficult to intubate, or hx/fam hx of malignant hyperthermia per pt  Pt told she does not have to stop ASA prior to procedure   No egg or soy allergy  No home oxygen use   No medications for weight loss taken

## 2021-02-16 NOTE — Telephone Encounter (Signed)
Hello Kelly Kennedy, she does not need to hold ASA prior to colonoscopy thx

## 2021-02-17 ENCOUNTER — Telehealth: Payer: Self-pay | Admitting: Family Medicine

## 2021-02-17 DIAGNOSIS — L02219 Cutaneous abscess of trunk, unspecified: Secondary | ICD-10-CM | POA: Diagnosis not present

## 2021-02-17 NOTE — Telephone Encounter (Signed)
Please check with patient and see about getting an office visit set up regarding her memory.  Cardiology mentioned she may be having some trouble with memory.  Thanks.

## 2021-02-19 DIAGNOSIS — S21001D Unspecified open wound of right breast, subsequent encounter: Secondary | ICD-10-CM | POA: Diagnosis not present

## 2021-02-19 NOTE — Telephone Encounter (Signed)
LVMTCB

## 2021-02-19 NOTE — Telephone Encounter (Signed)
Got her set up on 3/28 @ 4

## 2021-02-22 ENCOUNTER — Ambulatory Visit (INDEPENDENT_AMBULATORY_CARE_PROVIDER_SITE_OTHER): Payer: Medicare Other | Admitting: Family Medicine

## 2021-02-22 ENCOUNTER — Telehealth: Payer: Self-pay

## 2021-02-22 ENCOUNTER — Encounter: Payer: Self-pay | Admitting: Family Medicine

## 2021-02-22 ENCOUNTER — Other Ambulatory Visit: Payer: Self-pay

## 2021-02-22 VITALS — BP 124/70 | HR 65 | Temp 97.7°F | Ht 63.0 in | Wt 179.0 lb

## 2021-02-22 DIAGNOSIS — R413 Other amnesia: Secondary | ICD-10-CM

## 2021-02-22 DIAGNOSIS — K921 Melena: Secondary | ICD-10-CM

## 2021-02-22 DIAGNOSIS — R300 Dysuria: Secondary | ICD-10-CM | POA: Diagnosis not present

## 2021-02-22 DIAGNOSIS — S21001D Unspecified open wound of right breast, subsequent encounter: Secondary | ICD-10-CM | POA: Diagnosis not present

## 2021-02-22 MED ORDER — TRIMETHOPRIM 100 MG PO TABS: 100.0000 mg | ORAL_TABLET | Freq: Every day | ORAL | 1 refills | Status: DC

## 2021-02-22 NOTE — Patient Instructions (Signed)
Check your med list at home.  Update me if you have any differences or concerns.  I sent the rx for the antibiotic for your urine.  Let me know if you can't get it filled (trimethoprim). Go to the lab on the way out.   If you have mychart we'll likely use that to update you.    We'll go from there.   Take care.  Glad to see you.

## 2021-02-22 NOTE — Telephone Encounter (Signed)
The patients daughter(Darlene) left FMLA forms in the front to be filled out by provider. Charge sheet is attached to forms. Daughter  is the caretaker of her mother and needs forms completed to protect her job.  Would like to pick up forms on Fri. April 1st, 2022 if possible. Daughter's phone number is listed on patient acct.

## 2021-02-22 NOTE — Progress Notes (Signed)
This visit occurred during the SARS-CoV-2 public health emergency.  Safety protocols were in place, including screening questions prior to the visit, additional usage of staff PPE, and extensive cleaning of exam room while observing appropriate contact time as indicated for disinfecting solutions.  Dysuria, going on for months.  Off abx, prev with difficultly getting TMP rx filled.  Discussed.  Prescription sent today.  Here today with daughter, med list discussed with patient and daughter, they can let me know if they have trouble getting prescription filled.  She had seen Dr. Harlow Mares in the meantime re: her breast lesion.  She is seeing his clinic daily this week.  I will defer.  She agrees.  She is still having hot flashes.  She is off tamoxifen.    Memory d/w pt.  She admits to memory lapses.  Going on for about "a pretty good while."  Gradually worse per patient.  Family was concerned about polypharmacy contributing.  She was recently started on donepezil by outside clinic.  We spent a significant portion of the time going over her med list to make sure her medications were not contributing and also to make sure her med list was up-to-date and accurate.  MMSE done, 26 out of 30.  -1 for orientation -1 attention, -1 for recall, -1 for pentagon copying.  She had occasional black stool previously without gross blood noted.  No abdominal pain.  Meds, vitals, and allergies reviewed.   ROS: Per HPI unless specifically indicated in ROS section   GEN: nad, alert and oriented to year month and day of the week (she was 1 day off on the date of the month) HEENT: ncat NECK: supple w/o LA CV: rrr.  PULM: ctab, no inc wob ABD: soft, +bs EXT: no edema SKIN: Well-perfused. 45 minutes were devoted to patient care in this encounter (this includes time spent reviewing the patient's file/history, interviewing and examining the patient, counseling/reviewing plan with patient).

## 2021-02-23 LAB — URINE CULTURE
MICRO NUMBER:: 11700279
Result:: NO GROWTH
SPECIMEN QUALITY:: ADEQUATE

## 2021-02-23 LAB — CBC WITH DIFFERENTIAL/PLATELET
Basophils Absolute: 0.1 10*3/uL (ref 0.0–0.1)
Basophils Relative: 2.1 % (ref 0.0–3.0)
Eosinophils Absolute: 0 10*3/uL (ref 0.0–0.7)
Eosinophils Relative: 0.2 % (ref 0.0–5.0)
HCT: 34.8 % — ABNORMAL LOW (ref 36.0–46.0)
Hemoglobin: 11.8 g/dL — ABNORMAL LOW (ref 12.0–15.0)
Lymphocytes Relative: 50.9 % — ABNORMAL HIGH (ref 12.0–46.0)
Lymphs Abs: 3 10*3/uL (ref 0.7–4.0)
MCHC: 33.8 g/dL (ref 30.0–36.0)
MCV: 88.4 fl (ref 78.0–100.0)
Monocytes Absolute: 0.5 10*3/uL (ref 0.1–1.0)
Monocytes Relative: 8.8 % (ref 3.0–12.0)
Neutro Abs: 2.2 10*3/uL (ref 1.4–7.7)
Neutrophils Relative %: 38 % — ABNORMAL LOW (ref 43.0–77.0)
Platelets: 320 10*3/uL (ref 150.0–400.0)
RBC: 3.93 Mil/uL (ref 3.87–5.11)
RDW: 14.4 % (ref 11.5–15.5)
WBC: 5.9 10*3/uL (ref 4.0–10.5)

## 2021-02-23 LAB — COMPREHENSIVE METABOLIC PANEL
ALT: 13 U/L (ref 0–35)
AST: 19 U/L (ref 0–37)
Albumin: 3.6 g/dL (ref 3.5–5.2)
Alkaline Phosphatase: 47 U/L (ref 39–117)
BUN: 15 mg/dL (ref 6–23)
CO2: 28 mEq/L (ref 19–32)
Calcium: 8.9 mg/dL (ref 8.4–10.5)
Chloride: 106 mEq/L (ref 96–112)
Creatinine, Ser: 0.79 mg/dL (ref 0.40–1.20)
GFR: 71.34 mL/min (ref >60.00)
Glucose, Bld: 96 mg/dL (ref 70–99)
Potassium: 3.6 mEq/L (ref 3.5–5.1)
Sodium: 141 mEq/L (ref 135–145)
Total Bilirubin: 0.3 mg/dL (ref 0.2–1.2)
Total Protein: 6.3 g/dL (ref 6.0–8.3)

## 2021-02-23 LAB — URINALYSIS, ROUTINE W REFLEX MICROSCOPIC
Bilirubin Urine: NEGATIVE
Hgb urine dipstick: NEGATIVE
Ketones, ur: NEGATIVE
Leukocytes,Ua: NEGATIVE
Nitrite: NEGATIVE
Specific Gravity, Urine: 1.02 (ref 1.000–1.030)
Total Protein, Urine: NEGATIVE
Urine Glucose: NEGATIVE
Urobilinogen, UA: 0.2 (ref 0.0–1.0)
pH: 5.5 (ref 5.0–8.0)

## 2021-02-23 LAB — TSH: TSH: 0.38 u[IU]/mL (ref 0.35–4.50)

## 2021-02-23 LAB — VITAMIN B12: Vitamin B-12: 162 pg/mL — ABNORMAL LOW (ref 211–911)

## 2021-02-23 NOTE — Telephone Encounter (Signed)
l will work on the hard copy as soon as I can.  Please send it to me.  Thanks.

## 2021-02-24 ENCOUNTER — Encounter: Payer: Self-pay | Admitting: Family Medicine

## 2021-02-24 ENCOUNTER — Other Ambulatory Visit: Payer: Self-pay | Admitting: Family Medicine

## 2021-02-24 DIAGNOSIS — N390 Urinary tract infection, site not specified: Secondary | ICD-10-CM | POA: Insufficient documentation

## 2021-02-24 DIAGNOSIS — L02219 Cutaneous abscess of trunk, unspecified: Secondary | ICD-10-CM | POA: Diagnosis not present

## 2021-02-24 DIAGNOSIS — E538 Deficiency of other specified B group vitamins: Secondary | ICD-10-CM | POA: Insufficient documentation

## 2021-02-24 DIAGNOSIS — K921 Melena: Secondary | ICD-10-CM | POA: Insufficient documentation

## 2021-02-24 DIAGNOSIS — R300 Dysuria: Secondary | ICD-10-CM | POA: Insufficient documentation

## 2021-02-24 DIAGNOSIS — R413 Other amnesia: Secondary | ICD-10-CM | POA: Insufficient documentation

## 2021-02-24 MED ORDER — CYANOCOBALAMIN 1000 MCG/ML IJ SOLN: INTRAMUSCULAR | Status: DC

## 2021-02-24 NOTE — Assessment & Plan Note (Signed)
Unclear if this was diet related or if it was from blood in her stool.  Ifob sent home with patient.  We will get those resulted and go from there.  Benign abdominal exam.

## 2021-02-24 NOTE — Assessment & Plan Note (Signed)
Chronic issue for patient.  Check urine culture today along with urinalysis.  Restart trimethoprim.  See notes on labs.

## 2021-02-24 NOTE — Assessment & Plan Note (Signed)
Unclear source.  Reasonable to check routine labs today.  See notes on labs.  We will get those back and go from there.  She is already been started on donepezil in the meantime.

## 2021-02-25 ENCOUNTER — Telehealth: Payer: Self-pay

## 2021-02-25 NOTE — Telephone Encounter (Signed)
LVM for pts daughter to call my direct line  FMLA paperwork is signed, completed and faxed  Copy for pt to be mailed or picked up  Copy for billing  Copy for scan  Copy retained by me

## 2021-02-25 NOTE — Telephone Encounter (Signed)
Called Kelly Kennedy and notified her that the FMLA forms are ready to be picked up. Forms up front for pickup

## 2021-02-26 ENCOUNTER — Encounter: Payer: Self-pay | Admitting: Gastroenterology

## 2021-02-26 DIAGNOSIS — L02219 Cutaneous abscess of trunk, unspecified: Secondary | ICD-10-CM | POA: Diagnosis not present

## 2021-03-01 DIAGNOSIS — L02219 Cutaneous abscess of trunk, unspecified: Secondary | ICD-10-CM | POA: Diagnosis not present

## 2021-03-02 ENCOUNTER — Ambulatory Visit (AMBULATORY_SURGERY_CENTER): Payer: Medicare Other | Admitting: Gastroenterology

## 2021-03-02 ENCOUNTER — Other Ambulatory Visit: Payer: Self-pay

## 2021-03-02 ENCOUNTER — Telehealth: Payer: Self-pay | Admitting: Radiology

## 2021-03-02 ENCOUNTER — Other Ambulatory Visit (INDEPENDENT_AMBULATORY_CARE_PROVIDER_SITE_OTHER): Payer: Medicare Other

## 2021-03-02 ENCOUNTER — Encounter: Payer: Self-pay | Admitting: Gastroenterology

## 2021-03-02 VITALS — BP 99/46 | HR 74 | Temp 97.3°F | Resp 12 | Ht 63.0 in | Wt 165.0 lb

## 2021-03-02 DIAGNOSIS — K921 Melena: Secondary | ICD-10-CM | POA: Diagnosis not present

## 2021-03-02 DIAGNOSIS — K2960 Other gastritis without bleeding: Secondary | ICD-10-CM

## 2021-03-02 DIAGNOSIS — K219 Gastro-esophageal reflux disease without esophagitis: Secondary | ICD-10-CM

## 2021-03-02 DIAGNOSIS — K2970 Gastritis, unspecified, without bleeding: Secondary | ICD-10-CM | POA: Diagnosis not present

## 2021-03-02 DIAGNOSIS — R1310 Dysphagia, unspecified: Secondary | ICD-10-CM | POA: Diagnosis not present

## 2021-03-02 DIAGNOSIS — K222 Esophageal obstruction: Secondary | ICD-10-CM | POA: Diagnosis not present

## 2021-03-02 DIAGNOSIS — D649 Anemia, unspecified: Secondary | ICD-10-CM

## 2021-03-02 DIAGNOSIS — K449 Diaphragmatic hernia without obstruction or gangrene: Secondary | ICD-10-CM

## 2021-03-02 DIAGNOSIS — K3189 Other diseases of stomach and duodenum: Secondary | ICD-10-CM | POA: Diagnosis not present

## 2021-03-02 LAB — FECAL OCCULT BLOOD, IMMUNOCHEMICAL: Fecal Occult Bld: POSITIVE — AB

## 2021-03-02 MED ORDER — SODIUM CHLORIDE 0.9 % IV SOLN: 500.0000 mL | Freq: Once | INTRAVENOUS | Status: DC

## 2021-03-02 MED ORDER — OMEPRAZOLE 40 MG PO CPDR: 40.0000 mg | DELAYED_RELEASE_CAPSULE | Freq: Every day | ORAL | 3 refills | Status: DC

## 2021-03-02 NOTE — Telephone Encounter (Signed)
[facility] called a POSITIVE ifob, results given to Dr Duncan 

## 2021-03-02 NOTE — Progress Notes (Signed)
Pt given airway support with Ambu bag and Ephederine for B/P. Pt responed well. Procedure was completed after pt stabalized. To PACU, VSS. Report to [email or web address]

## 2021-03-02 NOTE — Patient Instructions (Addendum)
Nothing by  Mouth until 11:15, clear liquids until 12:15, soft foods for the rest of today. Tomorrow you may resume normal diet. Enteric coated Aspirin 81 mg ok to take, discontinue use of all other ASA/ NSAIDS long term.     YOU HAD AN ENDOSCOPIC PROCEDURE TODAY AT [facility]:   Refer to the procedure report that was given to you for any specific questions about what was found during the examination.  If the procedure report does not answer your questions, please call your gastroenterologist to clarify.  If you requested that your care partner not be given the details of your procedure findings, then the procedure report has been included in a sealed envelope for you to review at your convenience later.  YOU SHOULD EXPECT: Some feelings of bloating in the abdomen. Passage of more gas than usual.  Walking can help get rid of the air that was put into your GI tract during the procedure and reduce the bloating. If you had a lower endoscopy (such as a colonoscopy or flexible sigmoidoscopy) you may notice spotting of blood in your stool or on the toilet paper. If you underwent a bowel prep for your procedure, you may not have a normal bowel movement for a few days.  Please Note:  You might notice some irritation and congestion in your nose or some drainage.  This is from the oxygen used during your procedure.  There is no need for concern and it should clear up in a day or so.  SYMPTOMS TO REPORT IMMEDIATELY:    Following upper endoscopy (EGD)  Vomiting of blood or coffee ground material  New chest pain or pain under the shoulder blades  Painful or persistently difficult swallowing  New shortness of breath  Fever of 100F or higher  Black, tarry-looking stools  For urgent or emergent issues, a gastroenterologist can be reached at any hour by calling [phone]. Do not use MyChart messaging for urgent concerns.    DIET:  We do recommend a small meal at first, but then you  may proceed to your regular diet.  Drink plenty of fluids but you should avoid alcoholic beverages for 24 hours.  ACTIVITY:  You should plan to take it easy for the rest of today and you should NOT DRIVE or use heavy machinery until tomorrow (because of the sedation medicines used during the test).    FOLLOW UP: Our staff will call the number listed on your records 48-72 hours following your procedure to check on you and address any questions or concerns that you may have regarding the information given to you following your procedure. If we do not reach you, we will leave a message.  We will attempt to reach you two times.  During this call, we will ask if you have developed any symptoms of COVID 19. If you develop any symptoms (ie: fever, flu-like symptoms, shortness of breath, cough etc.) before then, please call [phone].  If you test positive for Covid 19 in the 2 weeks post procedure, please call and report this information to [place].    If any biopsies were taken you will be contacted by phone or by letter within the next 1-3 weeks.  Please call us at [phone] if you have not heard about the biopsies in 3 weeks.    SIGNATURES/CONFIDENTIALITY: You and/or your care partner have signed paperwork which will be entered into your electronic medical record.  These signatures attest to the fact that  that the information above on your After Visit Summary has been reviewed and is understood.  Full responsibility of the confidentiality of this discharge information lies with you and/or your care-partner.

## 2021-03-02 NOTE — Progress Notes (Signed)
Pt's states no medical or surgical changes since previsit or office visit.  Vitals CW   During interview w pt, pt states she had chest pain last week and took her nitro and cp was gone after about 30 mins.  Pt states she was pulling the lawn mower cord several times prior to this pain.  Pt states she has had no cp since and that she has not taken the nitro for a long time before this.  Notified Gershon Crane, CRNA, and Dr Fuller Plan.  Spoke with daughter and informed her of this information and that if pt uses nitro she should call 911 or call MD office to notify them of sx.

## 2021-03-02 NOTE — Op Note (Addendum)
[place] Patient Name: Kelly Kennedy Procedure Date: 03/02/2021 9:43 AM MRN: [id number] Endoscopist: Ladene Artist , MD Age: 80 Referring MD:  Date of Birth: 18-Feb-1941 Gender: Female Account #: [id number] Procedure:                Upper GI endoscopy Indications:              Dysphagia Medicines:                Monitored Anesthesia Care Procedure:                Pre-Anesthesia Assessment:                           - Prior to the procedure, a History and Physical                            was performed, and patient medications and                            allergies were reviewed. The patient's tolerance of                            previous anesthesia was also reviewed. The risks                            and benefits of the procedure and the sedation                            options and risks were discussed with the patient.                            All questions were answered, and informed consent                            was obtained. Prior Anticoagulants: The patient has                            taken no previous anticoagulant or antiplatelet                            agents. ASA Grade Assessment: III - A patient with                            severe systemic disease. After reviewing the risks                            and benefits, the patient was deemed in                            satisfactory condition to undergo the procedure.                           After obtaining informed consent, the endoscope was  passed under direct vision. Throughout the                            procedure, the patient's blood pressure, pulse, and                            oxygen saturations were monitored continuously. The                            Endoscope was introduced through the mouth, and                            advanced to the second part of duodenum. The upper                            GI endoscopy was accomplished without  difficulty.                            The patient tolerated the procedure well. Scope In: Scope Out: Findings:                 One benign-appearing, intrinsic mild stenosis was                            found at the gastroesophageal junction. This                            stenosis measured 1.5 cm (inner diameter) x less                            than one cm (in length). The stenosis was                            traversed. A guidewire was placed and the scope was                            withdrawn. Dilation was performed with a Savary                            dilator with mild resistance at 17 mm.                           The exam of the esophagus was otherwise normal.                           A few localized medium erosions with no bleeding                            and no stigmata of recent bleeding were found on                            the greater curvature of the stomach. Biopsies were  taken with a cold forceps for histology.                           A small hiatal hernia was present.                           The exam of the stomach was otherwise normal.                           The duodenal bulb and second portion of the                            duodenum were normal. Complications:            No immediate complications. Estimated Blood Loss:     Estimated blood loss was minimal. Impression:               - Benign-appearing esophageal stenosis. Dilated.                           - Erosive gastropathy with no bleeding and no                            stigmata of recent bleeding. Biopsied.                           - Small hiatal hernia.                           - Normal duodenal bulb and second portion of the                            duodenum. Recommendation:           - Patient has a contact number available for                            emergencies. The signs and symptoms of potential                            delayed  complications were discussed with the                            patient. Return to normal activities tomorrow.                            Written discharge instructions were provided to the                            patient.                           - Clear liquid diet for 2 hours, then advance as                            tolerated to soft diet today.                           -  Resume prior diet tomorrow.                           - Continue present medications.                           - Follow antireflux measures.                           - Change omeprazole to 40 mg po qd long term.                           - EC ASA 81 mg qd is ok however discontinue all                            other ASA/NSAIDs long term.                           - Await pathology results. Ladene Artist, MD 03/02/2021 10:16:07 AM This report has been signed electronically.

## 2021-03-02 NOTE — Progress Notes (Signed)
Called to room to assist during endoscopic procedure.  Patient ID and intended procedure confirmed with present staff. Received instructions for my participation in the procedure from the performing physician.  

## 2021-03-03 DIAGNOSIS — L02219 Cutaneous abscess of trunk, unspecified: Secondary | ICD-10-CM | POA: Diagnosis not present

## 2021-03-03 MED ORDER — IRON (FERROUS SULFATE) 325 (65 FE) MG PO TABS: 325.0000 mg | ORAL_TABLET | Freq: Every day | ORAL | 1 refills | Status: DC

## 2021-03-03 NOTE — Telephone Encounter (Signed)
See below.  IFOB positive.  Notify pt.   Would add on ferrous sulfate 325 daily.  rx sent.  It may make her stools dark/black.  Would still recheck labs as planned.  Would still continue B12 as planned.  No other work up now given recent EGD and colonoscopy.   Thanks.

## 2021-03-03 NOTE — Telephone Encounter (Signed)
I appreciate GI input.  IFOB positive, was done re: black stools.  Is it reasonable to add on iron 325mg  daily, recheck iron/cbc along with B12 with next set of labs?    Details below:   HGB most recently 11.8 with low B12 noted.  Have started B12 treatment.  Plan to continue omeprazole 40 mg po qd long term.  EGD recently done:  - Benign-appearing esophageal stenosis. Dilated. - Erosive gastropathy with no bleeding and no stigmata of recent bleeding. Biopsied. - Small hiatal hernia. - Normal duodenal bulb and second portion of the duodenum.  Colonoscopy done 09/2019:   - Four 7 to 9 mm polyps in the transverse colon and in the ascending colon, removed with a cold snare. Resected and retrieved. - Internal hemorrhoids. - The examination was otherwise normal on direct and retroflexion views.

## 2021-03-03 NOTE — Telephone Encounter (Signed)
Yes, Would check iron studies and B!2 and add Fe qd. Trend CBC. Continue omeprazole 40 mg po qd long term. Discontinue BC powders and all other ASA/NSAIDs except EC ASA 81 mg qd as recommended. With recent colonoscopy and EGD no further GI evaluation at this time.

## 2021-03-04 ENCOUNTER — Telehealth: Payer: Self-pay

## 2021-03-04 NOTE — Telephone Encounter (Signed)
LVM

## 2021-03-04 NOTE — Telephone Encounter (Signed)
  Follow up Call-  Call back number 03/02/2021 10/02/2019  Post procedure Call Back phone  # [phone]. [phone]  Permission to leave phone message Yes Yes  Some recent data might be hidden     Patient questions:  Do you have a fever, pain , or abdominal swelling? No. Pain Score  0 *  Have you tolerated food without any problems? Yes.    Have you been able to return to your normal activities? Yes.    Do you have any questions about your discharge instructions: Diet   No. Medications  No. Follow up visit  No.  Do you have questions or concerns about your Care? No.  Actions: * If pain score is 4 or above: No action needed, pain <4.  1. Have you developed a fever since your procedure? No   2.   Have you had an respiratory symptoms (SOB or cough) since your procedure? No   3.   Have you tested positive for COVID 19 since your procedure? No   4.   Have you had any family members/close contacts diagnosed with the COVID 19 since your procedure?  No    If yes to any of these questions please route to Joylene John, RN and Joella Prince, RN

## 2021-03-04 NOTE — Telephone Encounter (Signed)
Spoke with patients daughter [name]; okay per DPR. She is aware of patients lab results and advised rx was sent in for iron for patient to take QD. OV scheduled for 06/01/21 @ 2:00 pm.

## 2021-03-05 DIAGNOSIS — L02219 Cutaneous abscess of trunk, unspecified: Secondary | ICD-10-CM | POA: Diagnosis not present

## 2021-03-08 ENCOUNTER — Telehealth: Payer: Self-pay

## 2021-03-08 NOTE — Telephone Encounter (Signed)
Patient called and stated that the patients insurance will not pay for her ferrous sulfate prescription. Called and LVM for Darlene to call back.

## 2021-03-08 NOTE — Telephone Encounter (Signed)
PA started through Cover My Meds. Awaiting response.  Kelly Kennedy Key: [id number] - PA Case ID: [id number] Need help? Call us at [phone] Status Sent to [facility] Iron (Ferrous Sulfate) 325 (65 Fe)MG tablets Form [employer] Medicare Part D Electronic Prior Authorization Form (2017 NCPDP)

## 2021-03-09 NOTE — Telephone Encounter (Signed)
PA denied. Called and spoke with patients daughter [name] who verbalized understanding.

## 2021-03-10 ENCOUNTER — Ambulatory Visit (INDEPENDENT_AMBULATORY_CARE_PROVIDER_SITE_OTHER): Payer: Medicare Other

## 2021-03-10 ENCOUNTER — Ambulatory Visit: Payer: Medicare Other

## 2021-03-10 ENCOUNTER — Other Ambulatory Visit: Payer: Self-pay

## 2021-03-10 DIAGNOSIS — E538 Deficiency of other specified B group vitamins: Secondary | ICD-10-CM

## 2021-03-10 MED ORDER — CYANOCOBALAMIN 1000 MCG/ML IJ SOLN
1000.0000 ug | Freq: Once | INTRAMUSCULAR | Status: AC
  Administered 2021-03-10: 1000 ug via INTRAMUSCULAR

## 2021-03-10 NOTE — Progress Notes (Signed)
Per orders of Dr. Lorelei Pont, in Dr. Josefine Class absence, 1st of 4 weekly injections of B12 given by Loreen Freud. Patient tolerated injection well.

## 2021-03-12 DIAGNOSIS — L02219 Cutaneous abscess of trunk, unspecified: Secondary | ICD-10-CM | POA: Diagnosis not present

## 2021-03-16 ENCOUNTER — Encounter: Payer: Self-pay | Admitting: Gastroenterology

## 2021-03-17 ENCOUNTER — Ambulatory Visit (INDEPENDENT_AMBULATORY_CARE_PROVIDER_SITE_OTHER): Payer: Medicare Other | Admitting: *Deleted

## 2021-03-17 ENCOUNTER — Other Ambulatory Visit: Payer: Self-pay

## 2021-03-17 ENCOUNTER — Ambulatory Visit: Payer: Medicare Other

## 2021-03-17 DIAGNOSIS — E538 Deficiency of other specified B group vitamins: Secondary | ICD-10-CM | POA: Diagnosis not present

## 2021-03-17 MED ORDER — CYANOCOBALAMIN 1000 MCG/ML IJ SOLN
1000.0000 ug | Freq: Once | INTRAMUSCULAR | Status: AC
  Administered 2021-03-17: 1000 ug via INTRAMUSCULAR

## 2021-03-17 NOTE — Progress Notes (Signed)
Per orders of Dr. Danise Mina, in the absence of Dr. Damita Dunnings,  injection of Vitamin B12 given by Lauralyn Primes. Patient tolerated injection well.

## 2021-03-24 ENCOUNTER — Other Ambulatory Visit: Payer: Self-pay

## 2021-03-24 ENCOUNTER — Ambulatory Visit (INDEPENDENT_AMBULATORY_CARE_PROVIDER_SITE_OTHER): Payer: Medicare Other

## 2021-03-24 DIAGNOSIS — E538 Deficiency of other specified B group vitamins: Secondary | ICD-10-CM

## 2021-03-24 MED ORDER — CYANOCOBALAMIN 1000 MCG/ML IJ SOLN
1000.0000 ug | Freq: Once | INTRAMUSCULAR | Status: AC
  Administered 2021-03-24: 1000 ug via INTRAMUSCULAR

## 2021-03-24 NOTE — Progress Notes (Signed)
Patient presented for B 12 injection given by Lasheba Stevens, CMA to left deltoid, patient voiced no concerns nor showed any signs of distress during injection.  

## 2021-03-28 ENCOUNTER — Other Ambulatory Visit: Payer: Self-pay | Admitting: Cardiovascular Disease

## 2021-03-28 ENCOUNTER — Other Ambulatory Visit: Payer: Self-pay | Admitting: Family Medicine

## 2021-03-29 ENCOUNTER — Other Ambulatory Visit: Payer: Self-pay | Admitting: Family Medicine

## 2021-03-29 DIAGNOSIS — Z9012 Acquired absence of left breast and nipple: Secondary | ICD-10-CM

## 2021-03-29 DIAGNOSIS — Z1231 Encounter for screening mammogram for malignant neoplasm of breast: Secondary | ICD-10-CM

## 2021-03-31 ENCOUNTER — Ambulatory Visit (INDEPENDENT_AMBULATORY_CARE_PROVIDER_SITE_OTHER): Payer: Medicare Other

## 2021-03-31 ENCOUNTER — Other Ambulatory Visit: Payer: Self-pay

## 2021-03-31 DIAGNOSIS — E538 Deficiency of other specified B group vitamins: Secondary | ICD-10-CM | POA: Diagnosis not present

## 2021-03-31 MED ORDER — CYANOCOBALAMIN 1000 MCG/ML IJ SOLN
1000.0000 ug | Freq: Once | INTRAMUSCULAR | Status: AC
  Administered 2021-03-31: 1000 ug via INTRAMUSCULAR

## 2021-03-31 NOTE — Progress Notes (Signed)
Per orders of Dr. Danise Mina in the absence of Dr. Damita Dunnings, injection of B12, given by Aneta Mins, RN. Patient tolerated injection well in R Deltoid.

## 2021-04-09 ENCOUNTER — Encounter: Payer: Self-pay | Admitting: Family Medicine

## 2021-04-09 ENCOUNTER — Telehealth: Payer: Self-pay

## 2021-04-09 ENCOUNTER — Ambulatory Visit (INDEPENDENT_AMBULATORY_CARE_PROVIDER_SITE_OTHER): Payer: Medicare Other | Admitting: Family Medicine

## 2021-04-09 ENCOUNTER — Other Ambulatory Visit: Payer: Self-pay

## 2021-04-09 DIAGNOSIS — R059 Cough, unspecified: Secondary | ICD-10-CM | POA: Diagnosis not present

## 2021-04-09 MED ORDER — DOXYCYCLINE HYCLATE 100 MG PO TABS
100.0000 mg | ORAL_TABLET | Freq: Two times a day (BID) | ORAL | 0 refills | Status: DC
Start: 2021-04-09 — End: 2021-06-01

## 2021-04-09 MED ORDER — GUAIFENESIN ER 600 MG PO TB12: 600.0000 mg | ORAL_TABLET | Freq: Two times a day (BID) | ORAL | Status: DC

## 2021-04-09 NOTE — Patient Instructions (Signed)
Take mucinex twice a day with plenty of water.  If you still have discolored/yellow sputum, then start doxycycline.  Either way update me Monday.  Check to see if you are still taking carvedilol twice a day.  Take care.  Glad to see you.

## 2021-04-09 NOTE — Telephone Encounter (Signed)
Noted. Thanks.

## 2021-04-09 NOTE — Telephone Encounter (Signed)
I spoke with [name] (DPR signed) to verify that someone would be with pt and monitoring pt condition until appt. Darlene said yes her aunt is there now and [name] is going to be with her mom also. Darlene said she is aware even if pt comes in to office to see Dr Damita Dunnings pt may still have to go to ED depending on pts condition and Dr Josefine Class recommendation. Darlene said she would let pt know also when she sees her in person shortly.

## 2021-04-09 NOTE — Progress Notes (Signed)
This visit occurred during the SARS-CoV-2 public health emergency.  Safety protocols were in place, including screening questions prior to the visit, additional usage of staff PPE, and extensive cleaning of exam room while observing appropriate contact time as indicated for disinfecting solutions.  [place] today, wasn't lightheaded "my feel slipped out from under me" coming out of the house.  No injury except for small scrape on L 5th finger, extensor side.  No LOC.  No head injury.  D/w pt about putting up a rail on the steps.    Unclear if she is still on carvedilol.  I asked patient/family to check on that.    Recent neg covid test.  Fatigue.  Less active this week.  Some episodic chest pressure when getting out of bed.  Not happening when walking.  No sx currently.  Some rhinorrhea and ST.  No facial pain.  She has some SOB with exertion at baseline, that isn't worse this week.  No vomiting.  No nausea. Some cough, episodic, that isn't new for patient but a little more this week.  Some occ yellow sputum, that is new this week.  Loss of appetite.  Normal taste.   She reportedly did not tolerate iron replacement, with increasing leg swelling on medication.  Meds, vitals, and allergies reviewed.   ROS: Per HPI unless specifically indicated in ROS section   nad ncat Neck supple, no LA Sinuses not ttp x4 Minimal OP irritation  TM wnl B  Single tender LN on L side of upper neck.  No other LA in the neck. rrr ctab No wheeze.  No focal decrease in breath sounds Abdomen soft Extremities well perfused.  Detailed conversation with patient. 35 minutes were devoted to patient care in this encounter (this includes time spent reviewing the patient's file/history, interviewing and examining the patient, counseling/reviewing plan with patient).

## 2021-04-09 NOTE — Telephone Encounter (Signed)
[facility] - Client TELEPHONE ADVICE RECORD [facility] Patient Name: Kelly Kennedy Gender: Female DOB: 1941/03/28 Age: 80 Y 49 M 28 D Return Phone Number: [phone] (Primary), [phone] (Secondary) Address: City/ State/ Zip: [place] [address] Client [facility] - Client Client Site [place] Physician Renford Dills - MD Contact Type Call Who Is Calling Patient / Member / Family / Caregiver Call Type Triage / Clinical Caller Name Darlene Relationship To Patient Daughter Return Phone Number [phone] (Primary) Chief Complaint Headache Reason for Call Symptomatic / Request for [place] states her mother is fatigue, headache and feels warm. [place] Translation No Nurse Assessment Nurse: Kathi Ludwig, RN, Leana Roe Date/Time (Eastern Time): 04/09/2021 9:56:32 AM Confirm and document reason for call. If symptomatic, describe symptoms. ---Caller states her mother is fatigue, headache and feels warm. home covid negative. Does the patient have any new or worsening symptoms? ---Yes Will a triage be completed? ---Yes Related visit to physician within the last 2 weeks? ---No Does the PT have any chronic conditions? (i.e. diabetes, asthma, this includes High risk factors for pregnancy, etc.) ---Yes List chronic conditions. ---HTN Is this a behavioral health or substance abuse call? ---No Guidelines Guideline Title Affirmed Question Affirmed Notes Nurse Date/Time ([place] Time) COVID-19 - Diagnosed or Suspected Chest pain or pressure Kathi Ludwig, RN, Tracie 04/09/2021 9:58:16 AM Disp. Time Eilene Ghazi Time) Disposition Final User 04/09/2021 10:02:55 AM Go to ED Now (or PCP triage) Yes Kathi Ludwig, RN, Tracie PLEASE NOTE: All timestamps contained within this report are represented as [place] Standard Time. CONFIDENTIALTY  NOTICE: This fax transmission is intended only for the addressee. It contains information that is legally privileged, confidential or otherwise protected from use or disclosure. If you are not the intended recipient, you are strictly prohibited from reviewing, disclosing, copying using or disseminating any of this information or taking any action in reliance on or regarding this information. If you have received this fax in error, please notify us immediately by telephone so that we can arrange for its return to [place]. Phone: [phone], Toll-Free: [phone], Fax: [phone] Page: 2 of 2 Call Id: [id number] [place] Disagree/Comply Comply Caller Understands Yes PreDisposition Call Doctor Care Advice Given Per Guideline GO TO ED NOW (OR PCP TRIAGE): * IF NO PCP (PRIMARY CARE PROVIDER) SECOND-LEVEL TRIAGE: You need to be seen within the next hour. Go to the [facility] at _____________ [place] as soon as you can. GENERAL CARE ADVICE FOR COVID-19 SYMPTOMS: * The symptoms are generally treated the same whether you have COVID-19, influenza or some other respiratory virus. * Cough: Use cough drops. CARE ADVICE given per COVID-19 - DIAGNOSED OR SUSPECTED (Adult) guideline. * Feeling dehydrated: Drink extra liquids. If the air in your home is dry, use a humidifier. * Fever: For fever over 101 F (38.3 C), take acetaminophen every 4 to 6 hours (Adults 650 mg) OR ibuprofen every 6 to 8 hours (Adults 400 mg). Before taking any medicine, read all the instructions on the package. Do not take aspirin unless your doctor has prescribed it for you. * Muscle aches, headache, and other pains: Often this comes and goes with the fever. Take acetaminophen every 4 to 6 hours (Adults 650 mg) OR ibuprofen every 6 to 8 hours (Adults 400 mg). Before taking any medicine, read all the instructions on the package. Comments User: Estevan Ryder, RN Date/Time Eilene Ghazi Time): 04/09/2021 10:06:31 AM requests appt  rather than  going to UC/ER. no answer on backline. Daughter will take her to [place]

## 2021-04-09 NOTE — Telephone Encounter (Signed)
Darlene (DPR signed) said for one wk pt has been laying around which is not pts normal, chest pressure on and off, h/a not sure of pain level,dry cough,SOB with exertion; pt feels hot and then cold not sure if fever. No other covid symptoms and pt tested with home test on 04/07/21 that was negative. Darlene said pt is not going to ED or UC. I spoke with Dr Damita Dunnings and if pt is having CP needs to go to ED but if no CP can schedule with Dr Damita Dunnings today at 2:30. Darlene spoke with her aunt that is with pt now and pt is showering now;pt does not have CP but does still have chest pressure. Pt refusing to go to ED. Darlene scheduled in office appt with Dr Damita Dunnings today at 2:30 with ED precautions given and Darlene voiced understanding. Sending note to DR Damita Dunnings.

## 2021-04-11 NOTE — Assessment & Plan Note (Signed)
I do not suspect an ominous diagnosis.  Discussed options.  Lungs are clear Advised to take mucinex twice a day with plenty of water.  If still having discolored/yellow sputum, then start doxycycline.  Either way update me Monday.  I will patient/family to check to see if she is still taking carvedilol twice a day.  I will await update.  All agree with plan.  Okay for outpatient follow-up.

## 2021-04-15 ENCOUNTER — Other Ambulatory Visit: Payer: Self-pay | Admitting: Family Medicine

## 2021-05-20 ENCOUNTER — Ambulatory Visit
Admission: RE | Admit: 2021-05-20 | Discharge: 2021-05-20 | Disposition: A | Discharge status: Home / Self Care | Payer: Medicare Other | Source: Ambulatory Visit | Attending: Family Medicine | Admitting: Family Medicine

## 2021-05-20 ENCOUNTER — Other Ambulatory Visit: Payer: Self-pay

## 2021-05-20 DIAGNOSIS — Z1231 Encounter for screening mammogram for malignant neoplasm of breast: Secondary | ICD-10-CM | POA: Diagnosis not present

## 2021-05-20 DIAGNOSIS — Z9012 Acquired absence of left breast and nipple: Secondary | ICD-10-CM

## 2021-05-26 ENCOUNTER — Ambulatory Visit: Payer: Medicare Other | Admitting: Cardiovascular Disease

## 2021-06-01 ENCOUNTER — Encounter: Payer: Self-pay | Admitting: Family Medicine

## 2021-06-01 ENCOUNTER — Other Ambulatory Visit: Payer: Self-pay

## 2021-06-01 ENCOUNTER — Ambulatory Visit (INDEPENDENT_AMBULATORY_CARE_PROVIDER_SITE_OTHER): Payer: Medicare Other | Admitting: Family Medicine

## 2021-06-01 VITALS — BP 112/76 | HR 95 | Temp 97.7°F | Ht 63.0 in | Wt 176.0 lb

## 2021-06-01 DIAGNOSIS — Z8639 Personal history of other endocrine, nutritional and metabolic disease: Secondary | ICD-10-CM | POA: Diagnosis not present

## 2021-06-01 DIAGNOSIS — R739 Hyperglycemia, unspecified: Secondary | ICD-10-CM | POA: Diagnosis not present

## 2021-06-01 DIAGNOSIS — D649 Anemia, unspecified: Secondary | ICD-10-CM

## 2021-06-01 DIAGNOSIS — E538 Deficiency of other specified B group vitamins: Secondary | ICD-10-CM | POA: Diagnosis not present

## 2021-06-01 DIAGNOSIS — R413 Other amnesia: Secondary | ICD-10-CM | POA: Diagnosis not present

## 2021-06-01 MED ORDER — CYANOCOBALAMIN 1000 MCG/ML IJ SOLN
1000.0000 ug | Freq: Once | INTRAMUSCULAR | Status: AC
  Administered 2021-06-01: 1000 ug via INTRAMUSCULAR

## 2021-06-01 NOTE — Patient Instructions (Signed)
Go to the lab on the way out.   If you have mychart we'll likely use that to update you.     Don't change your meds for now but check to see if you are still taking amitriptyline at night.   We'll make plans when I see your labs.   Take care.  Glad to see you.

## 2021-06-01 NOTE — Progress Notes (Signed)
This visit occurred during the SARS-CoV-2 public health emergency.  Safety protocols were in place, including screening questions prior to the visit, additional usage of staff PPE, and extensive cleaning of exam room while observing appropriate contact time as indicated for disinfecting solutions.  Hyperglycemia.   No meds.   Most recent A1c <6.5.   Due for labs.    B12 def.  Due for f/u labs.  Last B12 injection prior to today was in May 2022.   Memory d/w pt.  She felt better in general B12 replacement.  Still on aricept.  She didn't see a dramatic change in memory, "just level" and not worse.  She noted short term changes, not long term lapses, at baseline.  Daughter is helping with meds, using an Environmental education officer and that helps.   She didn't tolerate oral iron due to bloating.  Follow-up labs pending.  PMH and SH reviewed Meds, vitals, and allergies reviewed.   ROS: Per HPI unless specifically indicated in ROS section   GEN: nad, alert and oriented HEENT: ncat NECK: supple w/o LA CV: rrr. PULM: ctab, no inc wob ABD: soft, +bs EXT: no edema SKIN: no acute rash

## 2021-06-02 ENCOUNTER — Other Ambulatory Visit: Payer: Self-pay | Admitting: Family Medicine

## 2021-06-02 DIAGNOSIS — E119 Type 2 diabetes mellitus without complications: Secondary | ICD-10-CM

## 2021-06-02 DIAGNOSIS — D649 Anemia, unspecified: Secondary | ICD-10-CM | POA: Insufficient documentation

## 2021-06-02 DIAGNOSIS — E538 Deficiency of other specified B group vitamins: Secondary | ICD-10-CM

## 2021-06-02 LAB — CBC WITH DIFFERENTIAL/PLATELET
Basophils Absolute: 0.1 10*3/uL (ref 0.0–0.1)
Basophils Relative: 1.2 % (ref 0.0–3.0)
Eosinophils Absolute: 0 10*3/uL (ref 0.0–0.7)
Eosinophils Relative: 0.1 % (ref 0.0–5.0)
HCT: 37.2 % (ref 36.0–46.0)
Hemoglobin: 12.4 g/dL (ref 12.0–15.0)
Lymphocytes Relative: 45.8 % (ref 12.0–46.0)
Lymphs Abs: 2.6 10*3/uL (ref 0.7–4.0)
MCHC: 33.2 g/dL (ref 30.0–36.0)
MCV: 88 fl (ref 78.0–100.0)
Monocytes Absolute: 0.5 10*3/uL (ref 0.1–1.0)
Monocytes Relative: 8.9 % (ref 3.0–12.0)
Neutro Abs: 2.5 10*3/uL (ref 1.4–7.7)
Neutrophils Relative %: 44 % (ref 43.0–77.0)
Platelets: 309 10*3/uL (ref 150.0–400.0)
RBC: 4.23 Mil/uL (ref 3.87–5.11)
RDW: 15.2 % (ref 11.5–15.5)
WBC: 5.6 10*3/uL (ref 4.0–10.5)

## 2021-06-02 LAB — VITAMIN B12: Vitamin B-12: 370 pg/mL (ref 211–911)

## 2021-06-02 LAB — IRON: Iron: 52 ug/dL (ref 42–145)

## 2021-06-02 LAB — HEMOGLOBIN A1C: Hgb A1c MFr Bld: 6.9 % — ABNORMAL HIGH (ref 4.6–6.5)

## 2021-06-02 NOTE — Assessment & Plan Note (Signed)
Repeat injection done today.  See notes on labs.

## 2021-06-02 NOTE — Assessment & Plan Note (Addendum)
Recheck labs pending.  Recent colonoscopy and EGD noted.  She did not tolerate oral iron.

## 2021-06-02 NOTE — Assessment & Plan Note (Signed)
History of.  See notes on labs.  No change in medications at this point.

## 2021-06-02 NOTE — Assessment & Plan Note (Signed)
Stable, no obvious change in the meantime.  Would continue with B12 replacement and Aricept.  See notes on labs.

## 2021-06-04 ENCOUNTER — Other Ambulatory Visit: Payer: Self-pay | Admitting: Family Medicine

## 2021-06-04 DIAGNOSIS — R3121 Asymptomatic microscopic hematuria: Secondary | ICD-10-CM | POA: Diagnosis not present

## 2021-06-04 DIAGNOSIS — R350 Frequency of micturition: Secondary | ICD-10-CM | POA: Diagnosis not present

## 2021-06-05 ENCOUNTER — Other Ambulatory Visit: Payer: Self-pay | Admitting: Family Medicine

## 2021-06-21 ENCOUNTER — Telehealth: Payer: Self-pay | Admitting: Cardiovascular Disease

## 2021-06-21 MED ORDER — DONEPEZIL HCL 5 MG PO TBDP
5.0000 mg | ORAL_TABLET | Freq: Every day | ORAL | 3 refills | Status: DC
Start: 2021-06-21 — End: 2021-09-16

## 2021-06-21 NOTE — Telephone Encounter (Signed)
*  STAT* If patient is at the pharmacy, call can be transferred to refill team.   1. Which medications need to be refilled? (please list name of each medication and dose if known) donepezil (ARICEPT ODT) 5 MG disintegrating tablet   2. Which pharmacy/location (including street and city if local pharmacy) is medication to be sent to? [id number] [place], [place] - [address]. Do they need a 30 day or 90 day supply? 30ds

## 2021-06-21 NOTE — Telephone Encounter (Signed)
Refill for donepezil (ARICEPT ODT) 5 MG disintegrating tablet sent 06/21/21

## 2021-06-28 ENCOUNTER — Telehealth: Payer: Self-pay | Admitting: Family Medicine

## 2021-06-28 NOTE — Telephone Encounter (Signed)
Patient notified form is ready for pickup and is up front.

## 2021-06-28 NOTE — Telephone Encounter (Signed)
Patient called in requesting info on Parking Placard form . Would like a call when its ready to be pick up

## 2021-07-06 ENCOUNTER — Other Ambulatory Visit: Payer: Self-pay

## 2021-07-06 ENCOUNTER — Ambulatory Visit (INDEPENDENT_AMBULATORY_CARE_PROVIDER_SITE_OTHER): Payer: Medicare Other | Admitting: *Deleted

## 2021-07-06 DIAGNOSIS — E538 Deficiency of other specified B group vitamins: Secondary | ICD-10-CM | POA: Diagnosis not present

## 2021-07-06 MED ORDER — CYANOCOBALAMIN 1000 MCG/ML IJ SOLN
1000.0000 ug | Freq: Once | INTRAMUSCULAR | Status: AC
  Administered 2021-07-06: 1000 ug via INTRAMUSCULAR

## 2021-07-06 NOTE — Progress Notes (Signed)
Per orders of Dr. Damita Dunnings, injection of Vitamin B12 given in Left Deltoid by Lauralyn Primes. Patient tolerated injection well.

## 2021-07-07 ENCOUNTER — Other Ambulatory Visit: Payer: Self-pay | Admitting: Family Medicine

## 2021-07-07 MED ORDER — CYANOCOBALAMIN 1000 MCG/ML IJ SOLN: 1000.0000 ug | INTRAMUSCULAR | Status: AC

## 2021-07-18 ENCOUNTER — Other Ambulatory Visit: Payer: Self-pay | Admitting: Family Medicine

## 2021-07-24 NOTE — Progress Notes (Deleted)
Cardiology Office Note:    Date:  07/24/2021   ID:  Kelly Kennedy, DOB [birth year], MRN [id number]  PCP:  [name], MD  Cardiologist:  Sanda Klein, MD  Electrophysiologist:  None   Referring MD: [name], MD   Chief Complaint: follow-up of chest pain  History of Present Illness:    Kelly Kennedy is a 80 y.o. female with a history of moderate non-obstructive CAD on cardiac catheterization in 08/2018, palpitations secondary to PVC and non-sustained atrial tachycardia, probable prior pericarditis, stroke, hypertension, hyperlipidemia intolerant to statins, type 2 diabetes mellitus, GERD, and breast cancer who is followed by Dr. Sallyanne Kuster and presents today for follow-up of chest pain.  Patient has a history of intermittent chest pain. She had a negative Myoview in 2015 and again in 2018. Coronary CTA in 08/2018 showed a calcium score of 274 (76th percentile for age and sex) with mild to moderate plaque in the proximal and mid LAD and proximal LCX arteries. FFR showed there was significant stenosis in the mid LCX artery after the takeoff of the OM2. Therefore, patient underwent cardiac catheterization later that month which showed only mild to moderate disease of a dominant LCX. LVEF was 65% by visual estimate. Medical management was recommended.   Patient was last seen by Dr. Sallyanne Kuster in 01/2021 at which time she reported she continues to have occasional brief episodes of retrosternal chest tightness at rest that responded promptly to sublingual Nitro. She reported taking Nitro several times per week but this was unchanged from her baseline. Family also mentioned that her memory problems had been worsening and family had started helping her with her medications. Given patient reported using sublingual Nitro multiple times per week, she was started on Imdur with plans to consider repeat cardiac catheterization if unable to control symptoms.  It was noted that she may also  have a component of coronary vasospasms.   Patient presents today for follow-up. ***  CAD - Mild to moderate non-obstructive CAD noted on LHC in 08/2018.  - *** - Continue Aspirin '81mg'$  daily.  - Continue antianginals: Coreg 6.'25mg'$  twice daily, Amlodipine '5mg'$  daily, and Imdur '30mg'$  daily. - Unable to tolerate statins. Continue Repatha.  Hypertension - BP *** - Continue current medications: Coreg 6.'25mg'$  twice daily, Amlodipine '5mg'$  daily, and Imdur '30mg'$  daily.   Hyperlipidemia - Lipid panel in 09/2020: Total Cholesterol 128, Triglycerides 134, HDL 72, LDL 23. - At LDL goal of <70 given CAD. - Intolerant to statins. Continue Repatha.   Type 2 Diabetes - Hemoglobin A1c 6.0 in 09/2020.  - Management per PCP.   Past Medical History:  Diagnosis Date   adenomatous Colon polyps    colonoscopy 8/99, 12/02, 8/06   Arthritis    Bell's palsy    Cancer (HCC)    DCIS left breast   Cataract    Chest pain    uses NTG as needed   Depression    Diabetes mellitus without complication ([place])    Diverticulosis of colon 06/2005   Ductal carcinoma in situ (DCIS) of left breast    Family history of adverse reaction to anesthesia    " my mother didn't wake up so they had to put her on life support for about an hour or more; my sisiter has problems waking up too from anesthesia.   GERD (gastroesophageal reflux disease)    Glaucoma    H/O hiatal hernia    Headache(784.0)    Hyperglycemia    Hyperlipidemia  Hypertension    Hypothyroid    Insomnia    Irritable bowel    Menopausal symptoms    Pericarditis 1980's   Stroke [facility])    TIA (transient ischemic attack)    09/2014    Past Surgical History:  Procedure Laterality Date   APPENDECTOMY     BREAST EXCISIONAL BIOPSY Right    benign   BREAST RECONSTRUCTION WITH PLACEMENT OF TISSUE EXPANDER AND FLEX HD (ACELLULAR HYDRATED DERMIS) Left 08/06/2015   Procedure: LEFT BREAST RECONSTRUCTION WITH PLACEMENT OF SALINE IMPLANT AND ACELLULAR DURMAL  MATRIX;  Surgeon: Crissie Reese, MD;  Location: [place];  Service: Plastics;  Laterality: Left;   CARDIAC CATHETERIZATION     CATARACT EXTRACTION Bilateral 03/12/15   CHOLECYSTECTOMY  1990   COLONOSCOPY     CYSTECTOMY  05/04/08   left lower arm, benign   ECTOPIC PREGNANCY SURGERY  1971   FRACTURE SURGERY     nose   LEFT HEART CATH AND CORONARY ANGIOGRAPHY [name] 09/21/2018   Procedure: LEFT HEART CATH AND CORONARY ANGIOGRAPHY;  Surgeon: Leonie Man, MD;  Location: [place] CV LAB;  Service: Cardiovascular;  Laterality: [name];   MASTECTOMY Left    NIPPLE SPARING MASTECTOMY Left 08/06/2015   Procedure: LEFT NIPPLE SPARING MASTECTOMY;  Surgeon: [name], MD;  Location: [place];  Service: General;  Laterality: Left;   ORIF ANKLE FRACTURE Left 10/23/2013   Procedure: OPEN REDUCTION INTERNAL FIXATION (ORIF) LEFT ANKLE FRACTURE;  Surgeon: Marianna Payment, MD;  Location: [place];  Service: Orthopedics;  Laterality: Left;   PARTIAL HYSTERECTOMY     ovaries intact   PARTIAL MASTECTOMY WITH NEEDLE LOCALIZATION AND AXILLARY SENTINEL LYMPH NODE BX Left 05/06/2015   Procedure: LEFT BREAST PARTIAL MASTECTOMY WITH NEEDLE LOCALIZATION AND SENTINEL LYMPH NODE MAPPING;  Surgeon: [name], MD;  Location: [place];  Service: General;  Laterality: Left;   TONSILLECTOMY     TUBAL LIGATION      Current Medications: No outpatient medications have been marked as taking for the 08/05/21 encounter (Appointment) with Darreld Mclean, PA-C.     Allergies:   Ace inhibitors, Angiotensin receptor blockers, Statins, Flonase [fluticasone propionate], Glipizide, Iron, Sulfa antibiotics, Niacin, and Red dye   Social History   Socioeconomic History   Marital status: Widowed    Spouse name: Not on file   Number of children: 3   Years of education: Not on file   Highest education level: Not on file  Occupational History   Occupation: retired, previous Engineer, manufacturing systems  Tobacco Use   Smoking  status: Former    Types: Cigarettes    Quit date: 04/03/2017    Years since quitting: 4.3   Smokeless tobacco: Former   Tobacco comments:    [place].  Vaping Use   Vaping Use: Never used  Substance and Sexual Activity   Alcohol use: No    Alcohol/week: 0.0 standard drinks   Drug use: No   Sexual activity: Never  Other Topics Concern   Not on file  Social History Narrative   From [place].   3 children, one died from MI in 2005/03/11   Married 1959-03-12   Husband and brother died in the same week in 12/11/12      Patient is right handed.   Patient has hs education.   Patient drinks 1 cup of soda daily, and green tea.   Social Determinants of Health   Financial Resource Strain: Not on file  Food Insecurity: Not on file  Transportation Needs: Not on file  Physical Activity: Not on file  Stress: Not on file  Social Connections: Not on file     Family History: The patient's family history includes Arthritis in her mother; Cancer in her brother and father; Colon cancer in her father; Diabetes in her mother; Heart disease in her son; Stomach cancer in her father. There is no history of Pancreatic cancer, Breast cancer, Esophageal cancer, or Rectal cancer.  ROS:   Please see the history of present illness.     EKGs/Labs/Other Studies Reviewed:    The following studies were reviewed today:  Echocardiogram 12/02/2014: Study Conclusions: - Left ventricle: The cavity size was normal. Wall thickness was    normal. Systolic function was normal. The estimated ejection    fraction was in the range of 50% to 55%. Wall motion was normal;    there were no regional wall motion abnormalities. Doppler    parameters are consistent with abnormal left ventricular    relaxation (grade 1 diastolic dysfunction). _______________  Coronary CTA 08/29/2018: Impressions: 1. Coronary calcium score of 274. This was 57 percentile for age and sex matched control. 2. Normal coronary  origin with left dominance. 3. Mild to moderate plaque in the proximal and mid LAD and proximal LCX arteries. Additional analysis with CT FFR will be submitted.  FFR Results: 1. Left Main:  No significant stenosis. 2. LAD: No significant stenosis. 3. LCX: Proximal: 0.94, distal: 0.79. 4. RCA: No significant stenosis.   FFR Impressions: 1. CT FFR analysis showed significant stenosis in the mid LCX artery after the takeoff of the OM2. LCX artery is a dominant artery. A cardiac catheterization should be considered. _______________  Left Cardiac Catheterization 09/21/2018: Dist Cx lesion is 45% stenosed -angiographically moderate at best disease. Does not explain CT scan. There is hyperdynamic left ventricular systolic function. The left ventricular ejection fraction is greater than 65% by visual estimate. LV end diastolic pressure is normal.   Mild to moderate disease distal dominant Circumflex.  There is no culprit lesion to explain CT scan findings. Otherwise essentially normal coronary arteries and a left dominant system. Normal LVEF with hyperdynamic function.  The patient had a brief run of PAT/SVT during injection.   Patient will return to short stay holding area for ongoing care. Would recommend beta-blocker.  The patient felt palpitations with PVCs and brief runs of PAT with left ventricular hemodynamics and LV gram.  She describes her chest discomfort as the symptoms noted with documented ectopy and brief PAT runs.   She will be ready for discharge home after bedrest.  Diagnostic Dominance: Left   EKG:  EKG *** ordered today. EKG personally reviewed and demonstrates ***.  Recent Labs: 02/22/2021: ALT 13; BUN 15; Creatinine, Ser 0.79; Potassium 3.6; Sodium 141; TSH 0.38 06/01/2021: Hemoglobin 12.4; Platelets 309.0  Recent Lipid Panel    Component Value Date/Time   CHOL 128 10/27/2020 1115   CHOL 121 10/30/2019 0915   TRIG 134.0 10/27/2020 1115   TRIG 203 10/03/2012 0000    HDL 78.30 10/27/2020 1115   HDL 72 10/30/2019 0915   CHOLHDL 2 10/27/2020 1115   VLDL 26.8 10/27/2020 1115   LDLCALC 23 10/27/2020 1115   LDLCALC 26 10/30/2019 0915   LDLDIRECT 154.0 10/27/2017 0756    Physical Exam:    Vital Signs: There were no vitals taken for this visit.    Wt Readings from Last 3 Encounters:  06/01/21 176 lb (79.8 kg)  04/09/21 174 lb 4.8 oz (79.1 kg)  03/02/21 165 lb (74.8 kg)     General: 80 y.o. female in no acute distress. HEENT: Normocephalic and atraumatic. Sclera clear. EOMs intact. Neck: Supple. No carotid bruits. No JVD. Heart: *** RRR. Distinct S1 and S2. No murmurs, gallops, or rubs. Radial and distal pedal pulses 2+ and equal bilaterally. Lungs: No increased work of breathing. Clear to ausculation bilaterally. No wheezes, rhonchi, or rales.  Abdomen: Soft, non-distended, and non-tender to palpation. Bowel sounds present in all 4 quadrants.  MSK: Normal strength and tone for age. *** Extremities: No lower extremity edema.    Skin: Warm and dry. Neuro: Alert and oriented x3. No focal deficits. Psych: Normal affect. Responds appropriately.   Assessment:    No diagnosis found.  Plan:     Disposition: Follow up in ***   Medication Adjustments/Labs and Tests Ordered: Current medicines are reviewed at length with the patient today.  Concerns regarding medicines are outlined above.  No orders of the defined types were placed in this encounter.  No orders of the defined types were placed in this encounter.   There are no Patient Instructions on file for this visit.   Signed, Darreld Mclean, PA-C  07/24/2021 3:34 PM    [facility]

## 2021-07-28 ENCOUNTER — Telehealth: Payer: Self-pay | Admitting: Cardiovascular Disease

## 2021-07-28 NOTE — Telephone Encounter (Signed)
The patient's daughter called asking for a different medication instead of the Aricept because she has not seen any changes. She has been advised to reach out to the patient's PCP or a neurologist but stated that Dr. Sallyanne Kuster ordered it so would like for his thoughts on a different medication.

## 2021-07-28 NOTE — Telephone Encounter (Signed)
Pt c/o medication issue:  1. Name of Medication: donepezil (ARICEPT ODT) 5 MG disintegrating tablet  2. How are you currently taking this medication (dosage and times per day)? As directed  3. Are you having a reaction (difficulty breathing--STAT)? No  4. What is your medication issue? Pt's daughter would like to be changed to a different memory pill because she's not seeing a difference in the pt's memory since she's been taking this medicine

## 2021-07-30 NOTE — Telephone Encounter (Signed)
The daughter stated that the patient already has a neurologist. She has been advised to reach out to them about the medication. She verbalized her understanding.

## 2021-08-05 ENCOUNTER — Ambulatory Visit: Payer: Medicare Other | Admitting: Student

## 2021-08-11 ENCOUNTER — Ambulatory Visit (INDEPENDENT_AMBULATORY_CARE_PROVIDER_SITE_OTHER): Payer: Medicare Other

## 2021-08-11 ENCOUNTER — Other Ambulatory Visit: Payer: Self-pay

## 2021-08-11 DIAGNOSIS — E538 Deficiency of other specified B group vitamins: Secondary | ICD-10-CM

## 2021-08-11 MED ORDER — CYANOCOBALAMIN 1000 MCG/ML IJ SOLN
1000.0000 ug | Freq: Once | INTRAMUSCULAR | Status: AC
  Administered 2021-08-11: 1000 ug via INTRAMUSCULAR

## 2021-08-11 NOTE — Progress Notes (Addendum)
Per orders of Dr. Lorelei Pont, in Dr. Josefine Class Absence, injection of B12, given by Loreen Freud. Patient tolerated injection well.

## 2021-08-17 ENCOUNTER — Other Ambulatory Visit: Payer: Self-pay | Admitting: Family Medicine

## 2021-08-19 ENCOUNTER — Telehealth: Payer: Self-pay | Admitting: Family Medicine

## 2021-08-19 ENCOUNTER — Telehealth: Payer: Self-pay | Admitting: Cardiovascular Disease

## 2021-08-19 NOTE — Telephone Encounter (Signed)
Pt c/o medication issue:  1. Name of Medication:    2. How are you currently taking this medication (dosage and times per day)?    3. Are you having a reaction (difficulty breathing--STAT)? no  4. What is your medication issue? Patient's daughter calling to see if dr can change her memory people she doesn't feel that it is working. Please advise

## 2021-08-19 NOTE — Telephone Encounter (Signed)
Spoke to patient's daughter [name] she was calling to schedule mother's follow up appointment with Dr.Croitoru.Stated appointment was scheduled with Sande Rives PA 11/21.Stated mother wants to see Dr.Croitoru.Advised I will send message to Dr.Croitoru's RN for appointment.

## 2021-08-19 NOTE — Telephone Encounter (Signed)
  Encourage patient to contact the pharmacy for refills or they can request refills through [facility]:  Please schedule appointment if longer than 1 year  NEXT APPOINTMENT DATE:10/04/21  MEDICATION:trimethoprim (TRIMPEX) 100 MG tablet  Is the patient out of medication? yes  PHARMACY:[facility]  Let patient know to contact pharmacy at the end of the day to make sure medication is ready.  Please notify patient to allow 48-72 hours to process  CLINICAL FILLS OUT ALL BELOW:   LAST REFILL  QTY REFILL DATE:    OTHER COMMENTS:    Okay for refill?  Please advise

## 2021-08-20 NOTE — Telephone Encounter (Signed)
Spoke with the patient's daughter. Appointment moved to Dr. Sallyanne Kuster on the same day.

## 2021-08-20 NOTE — Telephone Encounter (Signed)
Refill request for Trimethoprim 100 mg tablets  LOV - 06/01/21 Next OV - 10/04/21 Last refill - 02/22/21 #90/1

## 2021-08-22 MED ORDER — TRIMETHOPRIM 100 MG PO TABS: 100.0000 mg | ORAL_TABLET | Freq: Every day | ORAL | 1 refills | Status: DC

## 2021-08-22 NOTE — Telephone Encounter (Signed)
Sent. Thanks.   

## 2021-08-22 NOTE — Addendum Note (Signed)
Addended by: [name] on: 08/22/2021 12:30 PM   Modules accepted: Orders

## 2021-09-14 ENCOUNTER — Other Ambulatory Visit: Payer: Self-pay

## 2021-09-14 ENCOUNTER — Ambulatory Visit: Payer: Medicare Other

## 2021-09-14 ENCOUNTER — Ambulatory Visit (INDEPENDENT_AMBULATORY_CARE_PROVIDER_SITE_OTHER): Payer: Medicare Other

## 2021-09-14 ENCOUNTER — Other Ambulatory Visit: Payer: Self-pay | Admitting: Cardiovascular Disease

## 2021-09-14 DIAGNOSIS — E538 Deficiency of other specified B group vitamins: Secondary | ICD-10-CM | POA: Diagnosis not present

## 2021-09-14 MED ORDER — CYANOCOBALAMIN 1000 MCG/ML IJ SOLN
1000.0000 ug | Freq: Once | INTRAMUSCULAR | Status: AC
  Administered 2021-09-14: 1000 ug via INTRAMUSCULAR

## 2021-09-14 NOTE — Progress Notes (Signed)
Per orders of Dr. Damita Dunnings, injection of B12 was given by Ophelia Shoulder. Patient tolerated injection well.

## 2021-09-16 ENCOUNTER — Other Ambulatory Visit: Payer: Self-pay | Admitting: Cardiovascular Disease

## 2021-10-04 ENCOUNTER — Other Ambulatory Visit: Payer: Self-pay

## 2021-10-04 ENCOUNTER — Encounter: Payer: Self-pay | Admitting: Family Medicine

## 2021-10-04 ENCOUNTER — Ambulatory Visit (INDEPENDENT_AMBULATORY_CARE_PROVIDER_SITE_OTHER): Payer: Medicare Other | Admitting: Family Medicine

## 2021-10-04 VITALS — BP 146/80 | HR 83 | Temp 97.3°F | Ht 63.0 in | Wt 175.0 lb

## 2021-10-04 DIAGNOSIS — Z23 Encounter for immunization: Secondary | ICD-10-CM

## 2021-10-04 DIAGNOSIS — D649 Anemia, unspecified: Secondary | ICD-10-CM

## 2021-10-04 DIAGNOSIS — E538 Deficiency of other specified B group vitamins: Secondary | ICD-10-CM

## 2021-10-04 DIAGNOSIS — E119 Type 2 diabetes mellitus without complications: Secondary | ICD-10-CM | POA: Diagnosis not present

## 2021-10-04 DIAGNOSIS — R300 Dysuria: Secondary | ICD-10-CM

## 2021-10-04 DIAGNOSIS — R519 Headache, unspecified: Secondary | ICD-10-CM | POA: Diagnosis not present

## 2021-10-04 DIAGNOSIS — M25579 Pain in unspecified ankle and joints of unspecified foot: Secondary | ICD-10-CM | POA: Diagnosis not present

## 2021-10-04 DIAGNOSIS — R195 Other fecal abnormalities: Secondary | ICD-10-CM

## 2021-10-04 DIAGNOSIS — R413 Other amnesia: Secondary | ICD-10-CM | POA: Diagnosis not present

## 2021-10-04 MED ORDER — DICLOFENAC SODIUM 1 % EX GEL: 2.0000 g | Freq: Four times a day (QID) | CUTANEOUS | 12 refills | Status: DC | PRN

## 2021-10-04 MED ORDER — SOLIFENACIN SUCCINATE 5 MG PO TABS: 5.0000 mg | ORAL_TABLET | Freq: Every day | ORAL | 3 refills | Status: DC

## 2021-10-04 NOTE — Patient Instructions (Addendum)
Go to the lab on the way out.   If you have mychart we'll likely use that to update you.    Take care.  Glad to see you. I would try taking less amitriptyline- see if you can cut back by 1/2 tab.

## 2021-10-04 NOTE — Progress Notes (Signed)
This visit occurred during the SARS-CoV-2 public health emergency.  Safety protocols were in place, including screening questions prior to the visit, additional usage of staff PPE, and extensive cleaning of exam room while observing appropriate contact time as indicated for disinfecting solutions.  Diabetes:  No meds.   Hypoglycemic episodes: no sx noted from hypoglycemia recently.   Hyperglycemic episodes: no known episodes.   Feet problems: no Blood Sugars averaging: not checked.   eye exam within last year: she had to cancel but she is checking on that.   Labs pending.  See notes on labs.    She was using diclofenac on R ankle for pain, with relief.  Rx done at [facility] [place].    Memory follow up.  Daughter didn't see a change with taking aricept 5mg  a day, good or bad.  Pt  noted some changes in dreams with med use- unclear if from aricept vs amitriptyline.  D/w pt about memory, "it leaves me but then it comes back."   Short term changes noted by patient and family.  D/w pt about driving- she is selective about that, driving only local and short distances to familiar places.  Cautions d/w pt about and family.    D/w pt about trial of lower dose of amitriptyline.  She was worried about not sleeping well on lower dose.  Discussed.    Still on TMP and solifenacin at baseline.  Burning with urination.  Discussed rechecking her urine today.  No fevers.  More frequent BMs without blood in stool.  Some occ abd discomfort but not now.    She has occ HA, on the top of the head.  No vision loss.  No focal neuro changes in the ext x4.  Frontal but not temporal pain.  No double vision.  No specific pattern.  Going on for about 1 year, off and on.  No vomiting, no nausea.  They pass on their own, "they ease down."  PMH and SH reviewed  Meds, vitals, and allergies reviewed.   ROS: Per HPI unless specifically indicated in ROS section   GEN: nad, alert and pleasant in conversation. HEENT: ncat NECK: supple  w/o LA CV: rrr. PULM: ctab, no inc wob ABD: soft, +bs EXT: trace BLE edema SKIN: no acute rash  40 minutes were devoted to patient care in this encounter (this includes time spent reviewing the patient's file/history, interviewing and examining the patient, counseling/reviewing plan with patient).

## 2021-10-05 LAB — CBC WITH DIFFERENTIAL/PLATELET
Basophils Absolute: 0.1 10*3/uL (ref 0.0–0.1)
Basophils Relative: 0.9 % (ref 0.0–3.0)
Eosinophils Absolute: 0 10*3/uL (ref 0.0–0.7)
Eosinophils Relative: 0.2 % (ref 0.0–5.0)
HCT: 36.4 % (ref 36.0–46.0)
Hemoglobin: 12 g/dL (ref 12.0–15.0)
Lymphocytes Relative: 45.5 % (ref 12.0–46.0)
Lymphs Abs: 2.6 10*3/uL (ref 0.7–4.0)
MCHC: 32.9 g/dL (ref 30.0–36.0)
MCV: 88.2 fl (ref 78.0–100.0)
Monocytes Absolute: 0.5 10*3/uL (ref 0.1–1.0)
Monocytes Relative: 8.3 % (ref 3.0–12.0)
Neutro Abs: 2.6 10*3/uL (ref 1.4–7.7)
Neutrophils Relative %: 45.1 % (ref 43.0–77.0)
Platelets: 325 10*3/uL (ref 150.0–400.0)
RBC: 4.13 Mil/uL (ref 3.87–5.11)
RDW: 14.5 % (ref 11.5–15.5)
WBC: 5.7 10*3/uL (ref 4.0–10.5)

## 2021-10-05 LAB — HEMOGLOBIN A1C: Hgb A1c MFr Bld: 6.6 % — ABNORMAL HIGH (ref 4.6–6.5)

## 2021-10-05 LAB — URINE CULTURE
MICRO NUMBER:: 12602761
Result:: NO GROWTH
SPECIMEN QUALITY:: ADEQUATE

## 2021-10-05 LAB — URINALYSIS, ROUTINE W REFLEX MICROSCOPIC
Bilirubin Urine: NEGATIVE
Hgb urine dipstick: NEGATIVE
Ketones, ur: NEGATIVE
Leukocytes,Ua: NEGATIVE
Nitrite: NEGATIVE
RBC / HPF: NONE SEEN (ref 0–?)
Specific Gravity, Urine: 1.025 (ref 1.000–1.030)
Total Protein, Urine: NEGATIVE
Urine Glucose: NEGATIVE
Urobilinogen, UA: 0.2 (ref 0.0–1.0)
WBC, UA: NONE SEEN (ref 0–?)
pH: 6 (ref 5.0–8.0)

## 2021-10-05 LAB — LIPID PANEL
Cholesterol: 150 mg/dL (ref 0–200)
HDL: 70.5 mg/dL (ref >39.00)
LDL Cholesterol: 57 mg/dL (ref 0–99)
NonHDL: 79.78
Total CHOL/HDL Ratio: 2
Triglycerides: 115 mg/dL (ref 0.0–149.0)
VLDL: 23 mg/dL (ref 0.0–40.0)

## 2021-10-05 LAB — COMPREHENSIVE METABOLIC PANEL
ALT: 11 U/L (ref 0–35)
AST: 18 U/L (ref 0–37)
Albumin: 3.9 g/dL (ref 3.5–5.2)
Alkaline Phosphatase: 53 U/L (ref 39–117)
BUN: 12 mg/dL (ref 6–23)
CO2: 29 mEq/L (ref 19–32)
Calcium: 9.4 mg/dL (ref 8.4–10.5)
Chloride: 106 mEq/L (ref 96–112)
Creatinine, Ser: 0.74 mg/dL (ref 0.40–1.20)
GFR: 76.83 mL/min (ref >60.00)
Glucose, Bld: 87 mg/dL (ref 70–99)
Potassium: 4 mEq/L (ref 3.5–5.1)
Sodium: 143 mEq/L (ref 135–145)
Total Bilirubin: 0.5 mg/dL (ref 0.2–1.2)
Total Protein: 6.5 g/dL (ref 6.0–8.3)

## 2021-10-05 LAB — IRON: Iron: 96 ug/dL (ref 42–145)

## 2021-10-05 LAB — VITAMIN B12: Vitamin B-12: 692 pg/mL (ref 211–911)

## 2021-10-06 DIAGNOSIS — R195 Other fecal abnormalities: Secondary | ICD-10-CM | POA: Insufficient documentation

## 2021-10-06 DIAGNOSIS — M25579 Pain in unspecified ankle and joints of unspecified foot: Secondary | ICD-10-CM | POA: Insufficient documentation

## 2021-10-06 DIAGNOSIS — R519 Headache, unspecified: Secondary | ICD-10-CM | POA: Insufficient documentation

## 2021-10-06 NOTE — Assessment & Plan Note (Signed)
Unclear source.  We will check routine labs first.  No alarming neurologic symptoms.  Okay for outpatient follow-up.

## 2021-10-06 NOTE — Assessment & Plan Note (Signed)
Discussed gradual reduction in driving and only driving to familiar close placed in the meantime.  Routine cautions given to patient.  Reasonable to try decreasing amitriptyline to see if that makes any difference in her symptoms the meantime.  See after visit summary.  See notes on labs.

## 2021-10-06 NOTE — Assessment & Plan Note (Signed)
Previous evaluation by urology.  On trimethoprim at baseline.  She is tolerated that.  On Solifenacin at baseline.  See notes on labs.

## 2021-10-06 NOTE — Assessment & Plan Note (Signed)
Noted change in terms of frequency.  She has had some occasional discomfort in her abdomen but none now.  Would observe for now.  See notes on labs.

## 2021-10-06 NOTE — Assessment & Plan Note (Signed)
Reasonable continue as needed diclofenac topically.

## 2021-10-06 NOTE — Assessment & Plan Note (Signed)
No meds.  No known hypoglycemia.  See notes on labs.  Discussed with patient about eye clinic follow-up.

## 2021-10-18 ENCOUNTER — Ambulatory Visit: Payer: Medicare Other | Admitting: Student

## 2021-10-18 ENCOUNTER — Ambulatory Visit (INDEPENDENT_AMBULATORY_CARE_PROVIDER_SITE_OTHER): Payer: Medicare Other | Admitting: Cardiovascular Disease

## 2021-10-18 ENCOUNTER — Other Ambulatory Visit: Payer: Self-pay

## 2021-10-18 ENCOUNTER — Encounter: Payer: Self-pay | Admitting: Cardiovascular Disease

## 2021-10-18 VITALS — BP 142/84 | HR 82 | Ht 63.0 in | Wt 174.6 lb

## 2021-10-18 DIAGNOSIS — I25118 Atherosclerotic heart disease of native coronary artery with other forms of angina pectoris: Secondary | ICD-10-CM | POA: Diagnosis not present

## 2021-10-18 DIAGNOSIS — R4189 Other symptoms and signs involving cognitive functions and awareness: Secondary | ICD-10-CM

## 2021-10-18 DIAGNOSIS — I10 Essential (primary) hypertension: Secondary | ICD-10-CM

## 2021-10-18 DIAGNOSIS — E119 Type 2 diabetes mellitus without complications: Secondary | ICD-10-CM

## 2021-10-18 DIAGNOSIS — E7800 Pure hypercholesterolemia, unspecified: Secondary | ICD-10-CM

## 2021-10-18 MED ORDER — CARVEDILOL 12.5 MG PO TABS: 12.5000 mg | ORAL_TABLET | Freq: Two times a day (BID) | ORAL | 1 refills | Status: DC

## 2021-10-18 NOTE — Patient Instructions (Signed)
Medication Instructions:  INCREASE the Carvedilol to 12.5 mg twice daily  *If you need a refill on your cardiac medications before your next appointment, please call your pharmacy*   Lab Work: None ordered If you have labs (blood work) drawn today and your tests are completely normal, you will receive your results only by: [place] (if you have MyChart) OR A paper copy in the mail If you have any lab test that is abnormal or we need to change your treatment, we will call you to review the results.   Testing/Procedures: None ordered   Follow-Up: At [facility], you and your health needs are our priority.  As part of our continuing mission to provide you with exceptional heart care, we have created designated Provider Care Teams.  These Care Teams include your primary Cardiologist (physician) and Advanced Practice Providers (APPs -  Physician Assistants and Nurse Practitioners) who all work together to provide you with the care you need, when you need it.  We recommend signing up for the patient portal called "MyChart".  Sign up information is provided on this After Visit Summary.  MyChart is used to connect with patients for Virtual Visits (Telemedicine).  Patients are able to view lab/test results, encounter notes, upcoming appointments, etc.  Non-urgent messages can be sent to your provider as well.   To learn more about what you can do with MyChart, go to [email or web address].    Your next appointment:   6 month(s)  The format for your next appointment:   In Person  Provider:   Sanda Klein, MD

## 2021-10-18 NOTE — Progress Notes (Signed)
Cardiology Office Note:    Date:  10/24/2021   ID:  Kelly Kennedy, DOB 06-26-1941, MRN [id number]  PCP:  [name], MD  Cardiologist:  Sanda Klein, MD   Referring MD: [name], MD   Chief Complaint  Patient presents with   Coronary Artery Disease     History of Present Illness:    Kelly Kennedy is a 80 y.o. female with a hx of moderate CAD, multiple coronary risk factors (hypertension, hypercholesterolemia, type 2 diabetes mellitus), palpitations due to PVCs and nonsustained atrial tachycardia, previous TIA, probable previous pericarditis.   She continues to take occasional nitroglycerin for unpredictable episodes of retrosternal chest tightness.  The symptoms resolve, but it takes about 15 minutes for them to go away.  She does not have chest discomfort when she does housework such as vacuuming or mopping.  She reports that at home her diastolic blood pressure is often quite low in the 40s, although in the office today her blood pressure was about 140/80.  She denies dizziness, palpitations or syncope.  She denies dyspnea at rest or with activity.  Continues to have some issues with short-term memory lapses.  Donepezil seems to have "leveled off" what appeared to be gradual deterioration however.  No noticeable side effects from it.  She has a history of intolerance to numerous statins but had markedly improved lipid profile on Repatha.  She has a history of angioedema with ACE inhibitors and had marked anorexia when taking amlodipine and topiramate (both medications were stopped).  October 2019 coronary CT angiogram that showed a high-grade stenosis in the mid left circumflex coronary artery, CT FFR of 0.75.  The CT also showed a calcium score of 274 which places her on the 76th percentile.  Cardiac catheterization did not confirm the presence of a single discrete severe stenosis in the LCx coronary artery:  Dist Cx lesion is 45% stenosed  -angiographically moderate at best disease. Does not explain CT scan. There is hyperdynamic left ventricular systolic function. The left ventricular ejection fraction is greater than 65% by visual estimate. LV end diastolic pressure is normal.   Mild to moderate disease distal dominant Circumflex.  There is no culprit lesion to explain CT scan findings. Otherwise essentially normal coronary arteries and a left dominant system. Normal LVEF with hyperdynamic function.  The patient had a brief run of PAT/SVT during injection.  She had a low risk nuclear stress test in 2015 and a normal echo in 2016. She underwent surgery for cancer of the left breast.  She did not receive radiation therapy.  She does take a proton pump inhibitor for acid reflux.  Past Medical History:  Diagnosis Date   adenomatous Colon polyps    colonoscopy 8/99, 12/02, 8/06   Arthritis    Bell's palsy    Cancer (HCC)    DCIS left breast   Cataract    Chest pain    uses NTG as needed   Depression    Diabetes mellitus without complication ([place])    Diverticulosis of colon 06/2005   Ductal carcinoma in situ (DCIS) of left breast    Family history of adverse reaction to anesthesia    " my mother didn't wake up so they had to put her on life support for about an hour or more; my sisiter has problems waking up too from anesthesia.   GERD (gastroesophageal reflux disease)    Glaucoma    H/O hiatal hernia    Headache(784.0)  Hyperglycemia    Hyperlipidemia    Hypertension    Hypothyroid    Insomnia    Irritable bowel    Menopausal symptoms    Pericarditis 1980's   Stroke [facility])    TIA (transient ischemic attack)    09/2014    Past Surgical History:  Procedure Laterality Date   APPENDECTOMY     BREAST EXCISIONAL BIOPSY Right    benign   BREAST RECONSTRUCTION WITH PLACEMENT OF TISSUE EXPANDER AND FLEX HD (ACELLULAR HYDRATED DERMIS) Left 08/06/2015   Procedure: LEFT BREAST RECONSTRUCTION WITH PLACEMENT OF SALINE  IMPLANT AND ACELLULAR DURMAL MATRIX;  Surgeon: Crissie Reese, MD;  Location: [place];  Service: Plastics;  Laterality: Left;   CARDIAC CATHETERIZATION     CATARACT EXTRACTION Bilateral 2016   CHOLECYSTECTOMY  1990   COLONOSCOPY     CYSTECTOMY  05/04/08   left lower arm, benign   ECTOPIC PREGNANCY SURGERY  1971   FRACTURE SURGERY     nose   LEFT HEART CATH AND CORONARY ANGIOGRAPHY [name] 09/21/2018   Procedure: LEFT HEART CATH AND CORONARY ANGIOGRAPHY;  Surgeon: Leonie Man, MD;  Location: [place] [facility];  Service: Cardiovascular;  Laterality: [name];   MASTECTOMY Left    NIPPLE SPARING MASTECTOMY Left 08/06/2015   Procedure: LEFT NIPPLE SPARING MASTECTOMY;  Surgeon: [name], MD;  Location: [place];  Service: General;  Laterality: Left;   ORIF ANKLE FRACTURE Left 10/23/2013   Procedure: OPEN REDUCTION INTERNAL FIXATION (ORIF) LEFT ANKLE FRACTURE;  Surgeon: Marianna Payment, MD;  Location: [place];  Service: Orthopedics;  Laterality: Left;   PARTIAL HYSTERECTOMY     ovaries intact   PARTIAL MASTECTOMY WITH NEEDLE LOCALIZATION AND AXILLARY SENTINEL LYMPH NODE BX Left 05/06/2015   Procedure: LEFT BREAST PARTIAL MASTECTOMY WITH NEEDLE LOCALIZATION AND SENTINEL LYMPH NODE MAPPING;  Surgeon: [name], MD;  Location: [place];  Service: General;  Laterality: Left;   TONSILLECTOMY     TUBAL LIGATION      Current Medications: Current Meds  Medication Sig   amitriptyline (ELAVIL) 50 MG tablet TAKE 1 TO 2 TABLETS BY MOUTH AT BEDTIME AS NEEDED FOR SLEEP   amLODipine (NORVASC) 5 MG tablet Take 5 mg by mouth daily.   aspirin EC 81 MG tablet Take 1 tablet (81 mg total) by mouth daily. Swallow whole.   diclofenac Sodium (VOLTAREN) 1 % GEL Apply 2 g topically 4 (four) times daily as needed.   donepezil (ARICEPT ODT) 5 MG disintegrating tablet TAKE 1 TABLET BY MOUTH EVERYDAY AT BEDTIME   fluocinonide cream (LIDEX) 0.05 % APPLY TOPICALLY DAILY AS NEEDED. USE SPARINGLY.    levothyroxine (SYNTHROID) 75 MCG tablet TAKE 1 TABLET (75 MCG TOTAL) BY MOUTH DAILY BEFORE BREAKFAST.   Lifitegrast (XIIDRA) 5 % SOLN Apply 1 drop to eye in the morning and at bedtime. Twice eyes   LUMIGAN 0.01 % SOLN Place 1 drop into both eyes at bedtime.   nitrofurantoin (MACRODANTIN) 100 MG capsule Take 100 mg by mouth daily.   nitroGLYCERIN (NITROSTAT) 0.4 MG SL tablet Place 1 tablet (0.4 mg total) under the tongue every 5 (five) minutes as needed for chest pain.   omeprazole (PRILOSEC) 40 MG capsule Take 1 capsule (40 mg total) by mouth daily.   REPATHA SURECLICK 102 MG/ML SOAJ ADMINISTER 1 ML UNDER THE SKIN EVERY 14 DAYS   solifenacin (VESICARE) 5 MG tablet Take 1 tablet (5 mg total) by mouth daily.   trimethoprim (TRIMPEX) 100 MG tablet Take  1 tablet (100 mg total) by mouth daily.   [DISCONTINUED] carvedilol (COREG) 6.25 MG tablet TAKE 1 TABLET BY MOUTH TWICE A DAY     Allergies:   Ace inhibitors, Angiotensin receptor blockers, Statins, Flonase [fluticasone propionate], Glipizide, Iron, Sulfa antibiotics, Niacin, and Red dye   Social History   Socioeconomic History   Marital status: Widowed    Spouse name: Not on file   Number of children: 3   Years of education: Not on file   Highest education level: Not on file  Occupational History   Occupation: retired, previous Engineer, manufacturing systems  Tobacco Use   Smoking status: Former    Types: Cigarettes    Quit date: 04/03/2017    Years since quitting: 4.5   Smokeless tobacco: Former   Tobacco comments:    [place].  Vaping Use   Vaping Use: Never used  Substance and Sexual Activity   Alcohol use: No    Alcohol/week: 0.0 standard drinks   Drug use: No   Sexual activity: Never  Other Topics Concern   Not on file  Social History Narrative   From [place].   3 children, one died from MI in Mar 01, 2005   Married 03/02/1959   Husband and brother died in the same week in 12-01-2012      Patient is right handed.   Patient  has hs education.   Patient drinks 1 cup of soda daily, and green tea.   Social Determinants of Health   Financial Resource Strain: Not on file  Food Insecurity: Not on file  Transportation Needs: Not on file  Physical Activity: Not on file  Stress: Not on file  Social Connections: Not on file     Family History: The patient's family history includes Arthritis in her mother; Cancer in her brother and father; Colon cancer in her father; Diabetes in her mother; Heart disease in her son; Stomach cancer in her father. There is no history of Pancreatic cancer, Breast cancer, Esophageal cancer, or Rectal cancer.  ROS:   Please see the history of present illness.    All other systems are reviewed and are negative  EKGs/Labs/Other Studies Reviewed:    The following studies were reviewed today:   EKG:  EKG is not ordered today.  ECG from 11/26/2020 is personally reviewed and demonstrates no changes from previous tracings: Sinus rhythm, rare PVCs, LVH with secondary repolarization changes  recent Labs: 02/22/2021: TSH 0.38 10/04/2021: ALT 11; BUN 12; Creatinine, Ser 0.74; Hemoglobin 12.0; Platelets 325.0; Potassium 4.0; Sodium 143  Recent Lipid Panel    Component Value Date/Time   CHOL 150 10/04/2021 1632   CHOL 121 10/30/2019 0915   TRIG 115.0 10/04/2021 1632   TRIG 203 10/03/2012 0000   HDL 70.50 10/04/2021 1632   HDL 72 10/30/2019 0915   CHOLHDL 2 10/04/2021 1632   VLDL 23.0 10/04/2021 1632   LDLCALC 57 10/04/2021 1632   LDLCALC 26 10/30/2019 0915   LDLDIRECT 154.0 10/27/2017 0756    Physical Exam:    VS:  BP (!) 142/84 (BP Location: Left Arm, Patient Position: Sitting, Cuff Size: Normal)   Pulse 82   Ht 5\' 3"  (1.6 m)   Wt 174 lb 9.6 oz (79.2 kg)   SpO2 97%   BMI 30.93 kg/m    Rechecked BP 142/80. Wt Readings from Last 3 Encounters:  10/18/21 174 lb 9.6 oz (79.2 kg)  10/04/21 175 lb (79.4 kg)  06/01/21 176 lb (79.8 kg)  General: Alert, oriented x3, no distress,  mildly obese Head: no evidence of trauma, PERRL, EOMI, no exophtalmos or lid lag, no myxedema, no xanthelasma; normal ears, nose and oropharynx Neck: normal jugular venous pulsations and no hepatojugular reflux; brisk carotid pulses without delay and no carotid bruits Chest: clear to auscultation, no signs of consolidation by percussion or palpation, normal fremitus, symmetrical and full respiratory excursions Cardiovascular: normal position and quality of the apical impulse, regular rhythm, normal first and second heart sounds, no murmurs, rubs or gallops Abdomen: no tenderness or distention, no masses by palpation, no abnormal pulsatility or arterial bruits, normal bowel sounds, no hepatosplenomegaly Extremities: no clubbing, cyanosis or edema; 2+ radial, ulnar and brachial pulses bilaterally; 2+ right femoral, posterior tibial and dorsalis pedis pulses; 2+ left femoral, posterior tibial and dorsalis pedis pulses; no subclavian or femoral bruits Neurological: grossly nonfocal Psych: Normal mood and affect    ASSESSMENT:    1. Coronary artery disease involving native coronary artery of native heart with other form of angina pectoris ([place])   2. Essential hypertension   3. Pure hypercholesterolemia   4. Controlled type 2 diabetes mellitus without complication, without long-term current use of insulin ([place])   5. Cognitive deficits     PLAN:    In order of problems listed above:  CAD: I am not entirely convinced that her chest symptoms are due to angina pectoris.  It takes too long for nitroglycerin to lead to resolution.  No evidence of serious complications of coronary insufficiency.  Nevertheless, cannot exclude vasospasm (this could also be esophageal spasm).  She has a circumflex stenosis that was deemed significant by CT FFR but not by invasive coronary angiography.   Continue lipid-lowering therapy, beta-blockers, aspirin.   HTN: Increase carvedilol to 12.5 mg twice daily. HLP:  Continue Repatha.  All lipid parameters within target range. DM: Hemoglobin A1c has increased slightly to 6.6%, in full diabetes range. Cognitive deficits: Some improvement with donepezil.  Medication Adjustments/Labs and Tests Ordered: Current medicines are reviewed at length with the patient today.  Concerns regarding medicines are outlined above.  Orders Placed This Encounter  Procedures   EKG 12-Lead    Meds ordered this encounter  Medications   carvedilol (COREG) 12.5 MG tablet    Sig: Take 1 tablet (12.5 mg total) by mouth 2 (two) times daily.    Dispense:  180 tablet    Refill:  1     Patient Instructions  Medication Instructions:  INCREASE the Carvedilol to 12.5 mg twice daily  *If you need a refill on your cardiac medications before your next appointment, please call your pharmacy*   Lab Work: None ordered If you have labs (blood work) drawn today and your tests are completely normal, you will receive your results only by: [place] (if you have MyChart) OR A paper copy in the mail If you have any lab test that is abnormal or we need to change your treatment, we will call you to review the results.   Testing/Procedures: None ordered   Follow-Up: At [facility], you and your health needs are our priority.  As part of our continuing mission to provide you with exceptional heart care, we have created designated Provider Care Teams.  These Care Teams include your primary Cardiologist (physician) and Advanced Practice Providers (APPs -  Physician Assistants and Nurse Practitioners) who all work together to provide you with the care you need, when you need it.  We recommend signing up for the patient  portal called "MyChart".  Sign up information is provided on this After Visit Summary.  MyChart is used to connect with patients for Virtual Visits (Telemedicine).  Patients are able to view lab/test results, encounter notes, upcoming appointments, etc.  Non-urgent  messages can be sent to your provider as well.   To learn more about what you can do with MyChart, go to [email or web address].    Your next appointment:   6 month(s)  The format for your next appointment:   In Person  Provider:   Sanda Klein, MD      Signed, Sanda Klein, MD  10/24/2021 3:55 PM    [place]

## 2021-10-19 ENCOUNTER — Other Ambulatory Visit: Payer: Self-pay

## 2021-10-24 ENCOUNTER — Encounter: Payer: Self-pay | Admitting: Cardiovascular Disease

## 2021-11-09 ENCOUNTER — Other Ambulatory Visit: Payer: Self-pay | Admitting: Family Medicine

## 2021-11-09 NOTE — Telephone Encounter (Signed)
°  Encourage patient to contact the pharmacy for refills or they can request refills through [place]:  Please schedule appointment if longer than 1 year  NEXT APPOINTMENT DATE:02/07/22  MEDICATION:promethazine-dextromethorphan (PROMETHAZINE-DM) 6.25-15 MG/5ML syrup   Is the patient out of medication? yes  PHARMACY:[id number] [place], [place] [address]  Let patient know to contact pharmacy at the end of the day to make sure medication is ready.  Please notify patient to allow 48-72 hours to process  CLINICAL FILLS OUT ALL BELOW:   LAST REFILL:  QTY:  REFILL DATE:    OTHER COMMENTS:    Okay for refill?  Please advise

## 2021-11-10 MED ORDER — PROMETHAZINE-DM 6.25-15 MG/5ML PO SYRP
2.5000 mL | ORAL_SOLUTION | Freq: Four times a day (QID) | ORAL | 0 refills | Status: DC | PRN
Start: 2021-11-10 — End: 2022-02-07

## 2021-11-10 NOTE — Addendum Note (Signed)
Addended by: Helene Shoe on: 11/10/2021 09:44 AM   Modules accepted: Orders

## 2021-11-10 NOTE — Telephone Encounter (Signed)
Can try this rx.  Sent. Thanks.  Reasonable to take a covid test at home.  If worse or progressive sx, then seek eval.

## 2021-11-10 NOTE — Addendum Note (Signed)
Addended by: [name] on: 11/10/2021 05:08 PM   Modules accepted: Orders

## 2021-11-10 NOTE — Telephone Encounter (Signed)
I spoke with Kelly Kennedy (DPR signed) pt started on 11/08/21 with dry cough; no SOB or CP; no fever, no S/T or any other symptoms other than the dry cough. Pt has not tried anything OTC for the cough. Darlene said pt feels fine other than the cough. Sending note to Dr Damita Dunnings and Janett Billow CMA.  Name of Medication: promethazine DM 6.25 - 15 mg/ 5 ml [facility] [address] Last Fill or Written Date and Quantity: # 118 ml on 11/10/2020 Last Office Visit and Type: 10/04/21 FU Next Office Visit and Type: 02/07/22 for reck DM

## 2021-11-11 NOTE — Telephone Encounter (Signed)
LMTCB

## 2021-12-24 ENCOUNTER — Other Ambulatory Visit: Payer: Self-pay | Admitting: Family Medicine

## 2021-12-24 ENCOUNTER — Telehealth: Payer: Self-pay | Admitting: Family Medicine

## 2021-12-24 NOTE — Telephone Encounter (Signed)
°  Encourage patient to contact the pharmacy for refills or they can request refills through [place]:  Please schedule appointment if longer than 1 year  NEXT APPOINTMENT DATE:02/07/22  MEDICATION:levothyroxine (SYNTHROID) 75 MCG tablet  Is the patient out of medication?   [id number] [place], [place]  Let patient know to contact pharmacy at the end of the day to make sure medication is ready.  Please notify patient to allow 48-72 hours to process  CLINICAL FILLS OUT ALL BELOW:   LAST REFILL:  QTY:  REFILL DATE:    OTHER COMMENTS:    Okay for refill?  Please advise

## 2021-12-24 NOTE — Telephone Encounter (Signed)
Rx has already been sent in for patient.

## 2022-01-05 DIAGNOSIS — Z961 Presence of intraocular lens: Secondary | ICD-10-CM | POA: Diagnosis not present

## 2022-01-05 DIAGNOSIS — E119 Type 2 diabetes mellitus without complications: Secondary | ICD-10-CM | POA: Diagnosis not present

## 2022-01-05 DIAGNOSIS — H0288B Meibomian gland dysfunction left eye, upper and lower eyelids: Secondary | ICD-10-CM | POA: Diagnosis not present

## 2022-01-05 DIAGNOSIS — H0288A Meibomian gland dysfunction right eye, upper and lower eyelids: Secondary | ICD-10-CM | POA: Diagnosis not present

## 2022-01-05 DIAGNOSIS — H353131 Nonexudative age-related macular degeneration, bilateral, early dry stage: Secondary | ICD-10-CM | POA: Diagnosis not present

## 2022-01-05 DIAGNOSIS — H16223 Keratoconjunctivitis sicca, not specified as Sjogren's, bilateral: Secondary | ICD-10-CM | POA: Diagnosis not present

## 2022-01-05 DIAGNOSIS — H401131 Primary open-angle glaucoma, bilateral, mild stage: Secondary | ICD-10-CM | POA: Diagnosis not present

## 2022-01-05 LAB — HM DIABETES EYE EXAM

## 2022-02-07 ENCOUNTER — Encounter: Payer: Self-pay | Admitting: Family Medicine

## 2022-02-07 ENCOUNTER — Ambulatory Visit (INDEPENDENT_AMBULATORY_CARE_PROVIDER_SITE_OTHER): Payer: Medicare Other | Admitting: Family Medicine

## 2022-02-07 ENCOUNTER — Other Ambulatory Visit: Payer: Self-pay

## 2022-02-07 VITALS — BP 140/80 | HR 86 | Temp 97.3°F | Ht 63.0 in | Wt 178.0 lb

## 2022-02-07 DIAGNOSIS — R300 Dysuria: Secondary | ICD-10-CM

## 2022-02-07 DIAGNOSIS — E119 Type 2 diabetes mellitus without complications: Secondary | ICD-10-CM | POA: Diagnosis not present

## 2022-02-07 DIAGNOSIS — G4700 Insomnia, unspecified: Secondary | ICD-10-CM

## 2022-02-07 DIAGNOSIS — E538 Deficiency of other specified B group vitamins: Secondary | ICD-10-CM | POA: Diagnosis not present

## 2022-02-07 DIAGNOSIS — R413 Other amnesia: Secondary | ICD-10-CM | POA: Diagnosis not present

## 2022-02-07 LAB — POCT GLYCOSYLATED HEMOGLOBIN (HGB A1C): Hemoglobin A1C: 6.1 % — AB (ref 4.0–5.6)

## 2022-02-07 MED ORDER — CYANOCOBALAMIN 1000 MCG/ML IJ SOLN
1000.0000 ug | Freq: Once | INTRAMUSCULAR | Status: AC
  Administered 2022-02-07: 1000 ug via INTRAMUSCULAR

## 2022-02-07 MED ORDER — AMITRIPTYLINE HCL 50 MG PO TABS: 50.0000 mg | ORAL_TABLET | Freq: Every evening | ORAL | 1 refills | Status: DC | PRN

## 2022-02-07 NOTE — Progress Notes (Unsigned)
Subjective:   Mindee Barbie Croston is a 81 y.o. female who presents for Medicare Annual (Subsequent) preventive examination.  I connected with Shaivi Earleen Newport today by telephone and verified that I am speaking with the correct person using two identifiers. Location patient: home Location provider: work Persons participating in the virtual visit: patient, Marine scientist.    I discussed the limitations, risks, security and privacy concerns of performing an evaluation and management service by telephone and the availability of in person appointments. I also discussed with the patient that there may be a patient responsible charge related to this service. The patient expressed understanding and verbally consented to this telephonic visit.    Interactive audio and video telecommunications were attempted between this provider and patient, however failed, due to patient having technical difficulties OR patient did not have access to video capability.  We continued and completed visit with audio only.  Some vital signs may be absent or patient reported.   Time Spent with patient on telephone encounter: *** minutes  Review of Systems           Objective:    There were no vitals filed for this visit. There is no height or weight on file to calculate BMI.  Advanced Directives 02/13/2021 08/07/2019 09/21/2018 07/27/2018 09/19/2017 12/09/2016 06/06/2016  Does Patient Have a Medical Advance Directive? Yes Yes Yes No No No Yes  Type of Paramedic of [place];Living will Living will Living will - - - Living will  Does patient want to make changes to medical advance directive? - - No - Patient declined - - - No - Patient declined  Copy of [place] in Chart? - - No - copy requested - - - No - copy requested  Would patient like information on creating a medical advance directive? - No - Patient declined - - No - Patient declined - -  Pre-existing out of facility DNR  order (yellow form or pink MOST form) - - - - - - -    Current Medications (verified) Outpatient Encounter Medications as of 02/08/2022  Medication Sig   amitriptyline (ELAVIL) 50 MG tablet TAKE 1 TO 2 TABLETS BY MOUTH AT BEDTIME AS NEEDED FOR SLEEP   amLODipine (NORVASC) 5 MG tablet Take 5 mg by mouth daily.   aspirin EC 81 MG tablet Take 1 tablet (81 mg total) by mouth daily. Swallow whole.   carvedilol (COREG) 12.5 MG tablet Take 1 tablet (12.5 mg total) by mouth 2 (two) times daily.   cyanocobalamin (,VITAMIN B-12,) 1000 MCG/ML injection Inject 1 mL (1,000 mcg total) into the muscle every 30 (thirty) days. (Patient not taking: Reported on 10/18/2021)   diclofenac Sodium (VOLTAREN) 1 % GEL Apply 2 g topically 4 (four) times daily as needed.   donepezil (ARICEPT ODT) 5 MG disintegrating tablet TAKE 1 TABLET BY MOUTH EVERYDAY AT BEDTIME   fluocinonide cream (LIDEX) 0.05 % APPLY TOPICALLY DAILY AS NEEDED. USE SPARINGLY.   levothyroxine (SYNTHROID) 75 MCG tablet TAKE 1 TABLET BY MOUTH DAILY BEFORE BREAKFAST.   Lifitegrast (XIIDRA) 5 % SOLN Apply 1 drop to eye in the morning and at bedtime. Twice eyes   LUMIGAN 0.01 % SOLN Place 1 drop into both eyes at bedtime.   nitrofurantoin (MACRODANTIN) 100 MG capsule Take 100 mg by mouth daily.   nitroGLYCERIN (NITROSTAT) 0.4 MG SL tablet Place 1 tablet (0.4 mg total) under the tongue every 5 (five) minutes as needed for chest pain.   omeprazole (  PRILOSEC) 40 MG capsule Take 1 capsule (40 mg total) by mouth daily.   promethazine-dextromethorphan (PROMETHAZINE-DM) 6.25-15 MG/5ML syrup Take 2.5 mLs by mouth 4 (four) times daily as needed for cough (sedation caution).   REPATHA SURECLICK 539 MG/ML SOAJ ADMINISTER 1 ML UNDER THE SKIN EVERY 14 DAYS   solifenacin (VESICARE) 5 MG tablet Take 1 tablet (5 mg total) by mouth daily.   trimethoprim (TRIMPEX) 100 MG tablet Take 1 tablet (100 mg total) by mouth daily.   No facility-administered encounter medications  on file as of 02/08/2022.    Allergies (verified) Ace inhibitors, Angiotensin receptor blockers, Statins, Flonase [fluticasone propionate], Glipizide, Iron, Sulfa antibiotics, Niacin, and Red dye   History: Past Medical History:  Diagnosis Date   adenomatous Colon polyps    colonoscopy 8/99, 12/02, 8/06   Arthritis    Bell's palsy    Cancer (HCC)    DCIS left breast   Cataract    Chest pain    uses NTG as needed   Depression    Diabetes mellitus without complication ([place])    Diverticulosis of colon 06/2005   Ductal carcinoma in situ (DCIS) of left breast    Family history of adverse reaction to anesthesia    " my mother didn't wake up so they had to put her on life support for about an hour or more; my sisiter has problems waking up too from anesthesia.   GERD (gastroesophageal reflux disease)    Glaucoma    H/O hiatal hernia    Headache(784.0)    Hyperglycemia    Hyperlipidemia    Hypertension    Hypothyroid    Insomnia    Irritable bowel    Menopausal symptoms    Pericarditis 1980's   Stroke [facility])    TIA (transient ischemic attack)    09/2014   Past Surgical History:  Procedure Laterality Date   APPENDECTOMY     BREAST EXCISIONAL BIOPSY Right    benign   BREAST RECONSTRUCTION WITH PLACEMENT OF TISSUE EXPANDER AND FLEX HD (ACELLULAR HYDRATED DERMIS) Left 08/06/2015   Procedure: LEFT BREAST RECONSTRUCTION WITH PLACEMENT OF SALINE IMPLANT AND ACELLULAR DURMAL MATRIX;  Surgeon: Crissie Reese, MD;  Location: [place];  Service: Plastics;  Laterality: Left;   CARDIAC CATHETERIZATION     CATARACT EXTRACTION Bilateral 2016   CHOLECYSTECTOMY  1990   COLONOSCOPY     CYSTECTOMY  05/04/08   left lower arm, benign   ECTOPIC PREGNANCY SURGERY  1971   FRACTURE SURGERY     nose   LEFT HEART CATH AND CORONARY ANGIOGRAPHY [name] 09/21/2018   Procedure: LEFT HEART CATH AND CORONARY ANGIOGRAPHY;  Surgeon: Leonie Man, MD;  Location: [place] [facility];  Service: Cardiovascular;   Laterality: [name];   MASTECTOMY Left    NIPPLE SPARING MASTECTOMY Left 08/06/2015   Procedure: LEFT NIPPLE SPARING MASTECTOMY;  Surgeon: [name], MD;  Location: [place];  Service: General;  Laterality: Left;   ORIF ANKLE FRACTURE Left 10/23/2013   Procedure: OPEN REDUCTION INTERNAL FIXATION (ORIF) LEFT ANKLE FRACTURE;  Surgeon: Marianna Payment, MD;  Location: [place];  Service: Orthopedics;  Laterality: Left;   PARTIAL HYSTERECTOMY     ovaries intact   PARTIAL MASTECTOMY WITH NEEDLE LOCALIZATION AND AXILLARY SENTINEL LYMPH NODE BX Left 05/06/2015   Procedure: LEFT BREAST PARTIAL MASTECTOMY WITH NEEDLE LOCALIZATION AND SENTINEL LYMPH NODE MAPPING;  Surgeon: [name], MD;  Location: [place];  Service: General;  Laterality: Left;   TONSILLECTOMY  TUBAL LIGATION     Family History  Problem Relation Age of Onset   Cancer Father        stomach    Stomach cancer Father    Colon cancer Father    Heart disease Son        MI   Arthritis Mother    Diabetes Mother    Cancer Brother        lung cancer   Pancreatic cancer Neg Hx    Breast cancer Neg Hx    Esophageal cancer Neg Hx    Rectal cancer Neg Hx    Social History   Socioeconomic History   Marital status: Widowed    Spouse name: Not on file   Number of children: 3   Years of education: Not on file   Highest education level: Not on file  Occupational History   Occupation: retired, previous Engineer, manufacturing systems  Tobacco Use   Smoking status: Former    Types: Cigarettes    Quit date: 04/03/2017    Years since quitting: 4.8   Smokeless tobacco: Former   Tobacco comments:    [place].  Vaping Use   Vaping Use: Never used  Substance and Sexual Activity   Alcohol use: No    Alcohol/week: 0.0 standard drinks   Drug use: No   Sexual activity: Never  Other Topics Concern   Not on file  Social History Narrative   From [place].   3 children, one died from MI in 17-Feb-2005   Married Feb 18, 1959    Husband and brother died in the same week in 2012/11/19      Patient is right handed.   Patient has hs education.   Patient drinks 1 cup of soda daily, and green tea.   Social Determinants of Health   Financial Resource Strain: Not on file  Food Insecurity: Not on file  Transportation Needs: Not on file  Physical Activity: Not on file  Stress: Not on file  Social Connections: Not on file    Tobacco Counseling Counseling given: Not Answered Tobacco comments: PT STATES SHE DOES NOT SMOKE.   Clinical Intake:                 Diabetes:  Is the patient diabetic?  Yes  If diabetic, was a CBG obtained today?  No  Did the patient bring in their glucometer from home?  No  How often do you monitor your CBG's? ***.   Financial Strains and Diabetes Management:  Are you having any financial strains with the device, your supplies or your medication? {YES/NO:21197}.  Does the patient want to be seen by Chronic Care Management for management of their diabetes?  {YES/NO:21197} Would the patient like to be referred to a Nutritionist or for Diabetic Management?  {YES/NO:21197}  Diabetic Exams:  Diabetic Eye Exam: Completed 01/05/22.   Diabetic Foot Exam: Completed ***. Pt has been advised about the importance in completing this exam. Pt is scheduled for diabetic foot exam on ***.           Activities of Daily Living No flowsheet data found.  Patient Care Team: [name], MD as PCP - General [place], Dani Gobble, MD as PCP - Cardiology (Cardiology) [name], MD as Consulting Physician (General Surgery) Crissie Reese, MD as Consulting Physician (Plastic Surgery) Bjorn Loser, MD as Consulting Physician (Urology) Warden Fillers, MD as Consulting Physician (Ophthalmology)  Indicate any recent Medical Services you may have received from  other than [facility] providers in the past year (date may be approximate).     Assessment:   This is a routine wellness  examination for [place].  Hearing/Vision screen No results found.  Dietary issues and exercise activities discussed:     Goals Addressed   None    Depression Screen PHQ 2/9 Scores 10/27/2020 12/24/2018 10/15/2018 12/09/2016 09/29/2015 07/04/2014 07/02/2013  PHQ - 2 Score '3 6 2 '$ 0 0 2 6  PHQ- 9 Score 7 13 - - - - -    Fall Risk Fall Risk  10/27/2020 10/15/2018 12/09/2016 03/28/2016 09/29/2015  Falls in the past year? 1 1 Yes Yes Yes  Comment - - pt has accidental falls without injury related to falls - -  Number falls in past yr: 0 1 2 or more 2 or more 2 or more  Injury with Fall? 0 0 No No No  Risk for fall due to : Impaired balance/gait - - - Other (Comment)  Risk for fall due to: Comment - - - - pt lose balance    FALL RISK PREVENTION PERTAINING TO THE HOME:  Any stairs in or around the home? {YES/NO:21197} If so, are there any without handrails? {YES/NO:21197} Home free of loose throw rugs in walkways, pet beds, electrical cords, etc? {YES/NO:21197} Adequate lighting in your home to reduce risk of falls? {YES/NO:21197}  ASSISTIVE DEVICES UTILIZED TO PREVENT FALLS:  Life alert? {YES/NO:21197} Use of a cane, walker or w/c? {YES/NO:21197} Grab bars in the bathroom? {YES/NO:21197} Shower chair or bench in shower? {YES/NO:21197} Elevated toilet seat or a handicapped toilet? {YES/NO:21197}  TIMED UP AND GO:  Was the test performed? No .  Length of time to ambulate 10 feet: *** sec.   {Appearance of Gait:2101803}  Cognitive Function: MMSE - Mini Mental State Exam 12/09/2016  Orientation to time 5  Orientation to Place 5  Registration 3  Attention/ Calculation 0  Recall 3  Language- name 2 objects 0  Language- repeat 1  Language- follow 3 step command 3  Language- read & follow direction 0  Write a sentence 0  Copy design 0  Total score 20        Immunizations Immunization History  Administered Date(s) Administered   Fluad Quad(high Dose 65+) 10/27/2020,  10/04/2021   Influenza Split 10/11/2011, 09/27/2012   Influenza Whole 09/16/2008   Influenza,inj,Quad PF,6+ Mos 09/18/2013, 09/29/2014, 08/25/2015, 08/17/2016, 07/27/2017, 10/15/2018, 07/25/2019   PFIZER(Purple Top)SARS-COV-2 Vaccination 01/05/2020, 01/26/2020, 10/31/2020   Pneumococcal Conjugate-13 07/04/2014   Pneumococcal Polysaccharide-23 05/19/2011   Td 01/30/2007    TDAP status: Due, Education has been provided regarding the importance of this vaccine. Advised may receive this vaccine at local pharmacy or Health Dept. Aware to provide a copy of the vaccination record if obtained from local pharmacy or Health Dept. Verbalized acceptance and understanding.  Flu Vaccine status: Up to date  Pneumococcal vaccine status: Up to date  Covid-19 vaccine status: Completed vaccines  Qualifies for Shingles Vaccine? Yes   Zostavax completed No   {Shingrix Completed?:2101804}  Screening Tests Health Maintenance  Topic Date Due   Hepatitis C Screening  Never done   Zoster Vaccines- Shingrix (1 of 2) Never done   TETANUS/TDAP  01/29/2017   FOOT EXAM  10/31/2018   COVID-19 Vaccine (4 - Booster for Pfizer series) 12/26/2020   HEMOGLOBIN A1C  04/03/2022   MAMMOGRAM  05/20/2022   COLONOSCOPY (Pts 45-48yr Insurance coverage will need to be confirmed)  10/01/2022   OPHTHALMOLOGY EXAM  01/05/2023  Pneumonia Vaccine 56+ Years old  Completed   INFLUENZA VACCINE  Completed   DEXA SCAN  Completed   HPV VACCINES  Aged Out    Health Maintenance  Health Maintenance Due  Topic Date Due   Hepatitis C Screening  Never done   Zoster Vaccines- Shingrix (1 of 2) Never done   TETANUS/TDAP  01/29/2017   FOOT EXAM  10/31/2018   COVID-19 Vaccine (4 - Booster for Pfizer series) 12/26/2020    Colorectal cancer screening: No longer required.   Mammogram status: Completed 05/20/21. Repeat every year  {Bone Density status:21018021}  Lung Cancer Screening: (Low Dose CT Chest recommended if Age 31-80  years, 30 pack-year currently smoking OR have quit w/in 15years.) does not qualify.     Additional Screening:  Hepatitis C Screening: does qualify  Vision Screening: Recommended annual ophthalmology exams for early detection of glaucoma and other disorders of the eye. Is the patient up to date with their annual eye exam?  {YES/NO:21197} Who is the provider or what is the name of the office in which the patient attends annual eye exams? *** If pt is not established with a provider, would they like to be referred to a provider to establish care? {YES/NO:21197}.   Dental Screening: Recommended annual dental exams for proper oral hygiene  Community Resource Referral / Chronic Care Management: CRR required this visit?  {YES/NO:21197}  CCM required this visit?  {YES/NO:21197}     Plan:     I have personally reviewed and noted the following in the patients chart:   Medical and social history Use of alcohol, tobacco or illicit drugs  Current medications and supplements including opioid prescriptions.  Functional ability and status Nutritional status Physical activity Advanced directives List of other physicians Hospitalizations, surgeries, and ER visits in previous 12 months Vitals Screenings to include cognitive, depression, and falls Referrals and appointments  In addition, I have reviewed and discussed with patient certain preventive protocols, quality metrics, and best practice recommendations. A written personalized care plan for preventive services as well as general preventive health recommendations were provided to patient.   Due to this being a telephonic visit, the after visit summary with patients personalized plan was offered to patient via mail or my-chart. ***Patient declined at this time./ Patient would like to access on my-chart/ per request, patient was mailed a copy of AVS./ Patient preferred to pick up at office at next visit.   Loma Messing,  LPN   [id number]   Due to this being a telephonic visit, the after visit summary with patients personalized plan was offered to patient via mail or my-chart. ***Patient declined at this time./ Patient would like to access on my-chart/ per request, patient was mailed a copy of AVS./ Patient preferred to pick up at office at next visit.  Nurse Notes: none

## 2022-02-07 NOTE — Patient Instructions (Addendum)
Please drop off a urine sample at [facility] lab.  ?[place] ?[place], [place] ? ?B12 shot today.  ?No change in meds today.   ?I sent the refill for amitriptyline.   ? ?I want to recheck in about 3 months.  We can do labs at the visit.  I want to recheck your memory at the visit.  ? ?Update me as needed in the meantime.  ?Take care.  Glad to see you. ?

## 2022-02-07 NOTE — Progress Notes (Unsigned)
This visit occurred during the SARS-CoV-2 public health emergency.  Safety protocols were in place, including screening questions prior to the visit, additional usage of staff PPE, and extensive cleaning of exam room while observing appropriate contact time as indicated for disinfecting solutions.  Diabetes:  No meds.   Hypoglycemic episodes: no sx  Hyperglycemic episodes: no sx Feet problems: no Blood Sugars averaging: not checked.   eye exam within last year: yes A1c improved.    Headaches and sleep are better with '100mg'$  amitriptyline at night.    Daughter needed FMLA papers done and I'll work on those, d/w pt.    Dysuria d/w pt.  No fevers.  No abd pain.  Still on macrobid at baseline.    Memory d/w pt.  Some occ word searching.  Dec in amitriptyline didn't help.    Meds, vitals, and allergies reviewed.   ROS: Per HPI unless specifically indicated in ROS section   GEN: nad, alert and oriented HEENT: mucous membranes moist NECK: supple w/o LA CV: rrr. PULM: ctab, no inc wob ABD: soft, +bs EXT: no edema SKIN: no acute rash  Diabetic foot exam: Normal inspection No skin breakdown No calluses  Normal DP pulses Normal sensation to light touch and monofilament Nails normal

## 2022-02-08 ENCOUNTER — Ambulatory Visit (INDEPENDENT_AMBULATORY_CARE_PROVIDER_SITE_OTHER): Payer: Medicare Other

## 2022-02-08 VITALS — Ht 63.0 in | Wt 178.0 lb

## 2022-02-08 DIAGNOSIS — Z0000 Encounter for general adult medical examination without abnormal findings: Secondary | ICD-10-CM | POA: Diagnosis not present

## 2022-02-08 NOTE — Patient Instructions (Signed)
[name] , ?Thank you for taking time to complete your Medicare Wellness Visit. I appreciate your ongoing commitment to your health goals. Please review the following plan we discussed and let me know if I can assist you in the future.  ? ?Screening recommendations/referrals: ?Colonoscopy: no longer required  ?Mammogram: up to date, due 05/20/21 ?Bone Density: due, per our conversation, discuss with PCP ?Recommended yearly ophthalmology/optometry visit for glaucoma screening and checkup ?Recommended yearly dental visit for hygiene and checkup ? ?Vaccinations: ?Influenza vaccine: up to date  ?Pneumococcal vaccine: up to date  ?Tdap vaccine: due, 01/29/17, medicare may cover in the event that you are cut or injured  ?Shingles vaccine: Discuss with pharmacy   ?Covid-19:newest booster available at your local pharmacy ? ?Advanced directives: Please bring a copy of Living Will and/or Healthcare Power of Attorney for your chart. ? ? ?Conditions/risks identified: see problem list  ? ?Next appointment: Follow up in one year for your annual wellness visit  ? ? ?Preventive Care 12 Years and Older, Female ?Preventive care refers to lifestyle choices and visits with your health care provider that can promote health and wellness. ?What does preventive care include? ?A yearly physical exam. This is also called an annual well check. ?Dental exams once or twice a year. ?Routine eye exams. Ask your health care provider how often you should have your eyes checked. ?Personal lifestyle choices, including: ?Daily care of your teeth and gums. ?Regular physical activity. ?Eating a healthy diet. ?Avoiding tobacco and drug use. ?Limiting alcohol use. ?Practicing safe sex. ?Taking low-dose aspirin every day. ?Taking vitamin and mineral supplements as recommended by your health care provider. ?What happens during an annual well check? ?The services and screenings done by your health care provider during your annual well check will depend on  your age, overall health, lifestyle risk factors, and family history of disease. ?Counseling  ?Your health care provider may ask you questions about your: ?Alcohol use. ?Tobacco use. ?Drug use. ?Emotional well-being. ?Home and relationship well-being. ?Sexual activity. ?Eating habits. ?History of falls. ?Memory and ability to understand (cognition). ?Work and work Statistician. ?Reproductive health. ?Screening  ?You may have the following tests or measurements: ?Height, weight, and BMI. ?Blood pressure. ?Lipid and cholesterol levels. These may be checked every 5 years, or more frequently if you are over 55 years old. ?Skin check. ?Lung cancer screening. You may have this screening every year starting at age 21 if you have a 30-pack-year history of smoking and currently smoke or have quit within the past 15 years. ?Fecal occult blood test (FOBT) of the stool. You may have this test every year starting at age 34. ?Flexible sigmoidoscopy or colonoscopy. You may have a sigmoidoscopy every 5 years or a colonoscopy every 10 years starting at age 28. ?Hepatitis C blood test. ?Hepatitis B blood test. ?Sexually transmitted disease (STD) testing. ?Diabetes screening. This is done by checking your blood sugar (glucose) after you have not eaten for a while (fasting). You may have this done every 1-3 years. ?Bone density scan. This is done to screen for osteoporosis. You may have this done starting at age 51. ?Mammogram. This may be done every 1-2 years. Talk to your health care provider about how often you should have regular mammograms. ?Talk with your health care provider about your test results, treatment options, and if necessary, the need for more tests. ?Vaccines  ?Your health care provider may recommend certain vaccines, such as: ?Influenza vaccine. This is recommended every year. ?Tetanus, diphtheria, and  acellular pertussis (Tdap, Td) vaccine. You may need a Td booster every 10 years. ?Zoster vaccine. You may need this  after age 69. ?Pneumococcal 13-valent conjugate (PCV13) vaccine. One dose is recommended after age 34. ?Pneumococcal polysaccharide (PPSV23) vaccine. One dose is recommended after age 46. ?Talk to your health care provider about which screenings and vaccines you need and how often you need them. ?This information is not intended to replace advice given to you by your health care provider. Make sure you discuss any questions you have with your health care provider. ?Document Released: 12/11/2015 Document Revised: 08/03/2016 Document Reviewed: 09/15/2015 ?Elsevier Interactive Patient Education ? 2017 [employer]. ? ?Fall Prevention in the Home ?Falls can cause injuries. They can happen to people of all ages. There are many things you can do to make your home safe and to help prevent falls. ?What can I do on the outside of my home? ?Regularly fix the edges of walkways and driveways and fix any cracks. ?Remove anything that might make you trip as you walk through a door, such as a raised step or threshold. ?Trim any bushes or trees on the path to your home. ?Use bright outdoor lighting. ?Clear any walking paths of anything that might make someone trip, such as rocks or tools. ?Regularly check to see if handrails are loose or broken. Make sure that both sides of any steps have handrails. ?Any raised decks and porches should have guardrails on the edges. ?Have any leaves, snow, or ice cleared regularly. ?Use sand or salt on walking paths during winter. ?Clean up any spills in your garage right away. This includes oil or grease spills. ?What can I do in the bathroom? ?Use night lights. ?Install grab bars by the toilet and in the tub and shower. Do not use towel bars as grab bars. ?Use non-skid mats or decals in the tub or shower. ?If you need to sit down in the shower, use a plastic, non-slip stool. ?Keep the floor dry. Clean up any water that spills on the floor as soon as it happens. ?Remove soap buildup in the tub or  shower regularly. ?Attach bath mats securely with double-sided non-slip rug tape. ?Do not have throw rugs and other things on the floor that can make you trip. ?What can I do in the bedroom? ?Use night lights. ?Make sure that you have a light by your bed that is easy to reach. ?Do not use any sheets or blankets that are too big for your bed. They should not hang down onto the floor. ?Have a firm chair that has side arms. You can use this for support while you get dressed. ?Do not have throw rugs and other things on the floor that can make you trip. ?What can I do in the kitchen? ?Clean up any spills right away. ?Avoid walking on wet floors. ?Keep items that you use a lot in easy-to-reach places. ?If you need to reach something above you, use a strong step stool that has a grab bar. ?Keep electrical cords out of the way. ?Do not use floor polish or wax that makes floors slippery. If you must use wax, use non-skid floor wax. ?Do not have throw rugs and other things on the floor that can make you trip. ?What can I do with my stairs? ?Do not leave any items on the stairs. ?Make sure that there are handrails on both sides of the stairs and use them. Fix handrails that are broken or loose. Make sure that  handrails are as long as the stairways. ?Check any carpeting to make sure that it is firmly attached to the stairs. Fix any carpet that is loose or worn. ?Avoid having throw rugs at the top or bottom of the stairs. If you do have throw rugs, attach them to the floor with carpet tape. ?Make sure that you have a light switch at the top of the stairs and the bottom of the stairs. If you do not have them, ask someone to add them for you. ?What else can I do to help prevent falls? ?Wear shoes that: ?Do not have high heels. ?Have rubber bottoms. ?Are comfortable and fit you well. ?Are closed at the toe. Do not wear sandals. ?If you use a stepladder: ?Make sure that it is fully opened. Do not climb a closed stepladder. ?Make  sure that both sides of the stepladder are locked into place. ?Ask someone to hold it for you, if possible. ?Clearly mark and make sure that you can see: ?Any grab bars or handrails. ?First and last steps. ?

## 2022-02-09 NOTE — Assessment & Plan Note (Signed)
She can drop off a urine sample for culture and urine dipstick.  See orders.  See after visit summary.  She cannot give a sample at the office visit today. ?

## 2022-02-09 NOTE — Assessment & Plan Note (Signed)
Discussed restarting replacement and we can recheck level later on.  Discussed not letting her B12 level drop low as that could potentially affect her memory. ?

## 2022-02-09 NOTE — Assessment & Plan Note (Signed)
No medications currently.  A1c controlled.  We can recheck periodically.  See after visit summary. ?30 minutes were devoted to patient care in this encounter (this includes time spent reviewing the patient's file/history, interviewing and examining the patient, counseling/reviewing plan with patient).  ? ?

## 2022-02-09 NOTE — Assessment & Plan Note (Signed)
Continue B12 replacement and we can recheck memory at next office visit. ?

## 2022-02-09 NOTE — Assessment & Plan Note (Signed)
She is sleeping better with amitriptyline 100 mg at night.  She failed taper otherwise.  See above.  Continue amitriptyline. ?

## 2022-02-11 ENCOUNTER — Telehealth: Payer: Self-pay

## 2022-02-11 NOTE — Telephone Encounter (Signed)
? ?  Telephone encounter was:  Successful.  ?02/11/2022 ?Name: Kelly Kennedy MRN: [id number] DOB: [birth year] ? ?Kelly Kennedy is a 81 y.o. year old female who is a primary care patient of [name], MD . The community resource team was consulted for assistance with Food Insecurity ? ?Care guide performed the following interventions: Spoke with patient's daughter Kelly Kennedy, patient is not interested in receiving meals on wheels but would like a food pantry list mailed to her home. Patient is not interested in meals on wheels or food stamps. Verified home address letter will be mailed out next week. Letter saved in Epic. ? ?Follow Up Plan:  Care guide will follow up with patient by phone over the next 7-10 days ? ?Lasean Rahming, [place], CHC ?Care Guide  Embedded Care Coordination ?  Care Management  ?[address] ?[place], [place] [address] ???[email or web address]  ?? [phone]   ?[email or web address] ?  ?

## 2022-02-12 ENCOUNTER — Other Ambulatory Visit: Payer: Self-pay | Admitting: Cardiovascular Disease

## 2022-02-12 ENCOUNTER — Other Ambulatory Visit: Payer: Self-pay | Admitting: Family Medicine

## 2022-02-12 ENCOUNTER — Other Ambulatory Visit: Payer: Self-pay | Admitting: Gastroenterology

## 2022-02-12 DIAGNOSIS — K219 Gastro-esophageal reflux disease without esophagitis: Secondary | ICD-10-CM

## 2022-02-14 ENCOUNTER — Other Ambulatory Visit (INDEPENDENT_AMBULATORY_CARE_PROVIDER_SITE_OTHER): Payer: Medicare Other

## 2022-02-14 DIAGNOSIS — R300 Dysuria: Secondary | ICD-10-CM | POA: Diagnosis not present

## 2022-02-15 LAB — URINALYSIS, ROUTINE W REFLEX MICROSCOPIC
Bilirubin Urine: NEGATIVE
Hgb urine dipstick: NEGATIVE
Ketones, ur: NEGATIVE
Leukocytes,Ua: NEGATIVE
Nitrite: NEGATIVE
RBC / HPF: NONE SEEN (ref 0–?)
Specific Gravity, Urine: >=1.03 — AB (ref 1.000–1.030)
Total Protein, Urine: NEGATIVE
Urine Glucose: NEGATIVE
Urobilinogen, UA: 0.2 (ref 0.0–1.0)
pH: 5.5 (ref 5.0–8.0)

## 2022-02-15 LAB — URINE CULTURE
MICRO NUMBER:: 13152370
Result:: NO GROWTH
SPECIMEN QUALITY:: ADEQUATE

## 2022-02-18 ENCOUNTER — Telehealth: Payer: Self-pay

## 2022-02-18 NOTE — Telephone Encounter (Signed)
? ?  Telephone encounter was:  Successful.  ?02/18/2022 ?Name: [name] MRN: [id number] DOB: [birth year] ? ?Kelly Kennedy is a 81 y.o. year old female who is a primary care patient of [name], MD . The community resource team was consulted for assistance with Food Insecurity ? ?Care guide performed the following interventions: Received phone message from patient's daughter [name], she has received the food pantry information emailed on 02/11/22. ? ?Follow Up Plan:  No further follow up planned at this time. The patient has been provided with needed resources. ? ?Varonica Siharath, [place], CHC ?Care Guide  Embedded Care Coordination ?[facility]  Care Management  ?[address] ?[place], [place] [address] ?[email or web address]  ?? [phone]   ?[email or web address] ?  ?

## 2022-02-18 NOTE — Telephone Encounter (Signed)
? ?  Telephone encounter was:  Unsuccessful.  02/18/2022 ?Name: Kelly Kennedy MRN: [id number] DOB: 1941-09-17 ? ?Unsuccessful outbound call made today to assist with:  Food Insecurity ? ?Outreach Attempt:  2nd Attempt ? ?A HIPAA compliant voice message was left requesting a return call.  Instructed patient to call back at [phone]. eft message on voicemail for patient's daughter Kelly Kennedy to return my call regarding food resources mailed.  ? ?Gavyn Zoss, [place], [place] ?Care Guide  Embedded Care Coordination ?[facility]  Care Management  ?[address] ?[place], [place] [address] ???[email or web address]  ?? [phone]   ?[email or web address] ?  ?

## 2022-02-19 ENCOUNTER — Other Ambulatory Visit: Payer: Self-pay | Admitting: Gastroenterology

## 2022-02-19 ENCOUNTER — Other Ambulatory Visit: Payer: Self-pay | Admitting: Cardiovascular Disease

## 2022-02-19 DIAGNOSIS — K219 Gastro-esophageal reflux disease without esophagitis: Secondary | ICD-10-CM

## 2022-02-22 ENCOUNTER — Other Ambulatory Visit: Payer: Self-pay | Admitting: Family Medicine

## 2022-02-22 DIAGNOSIS — R300 Dysuria: Secondary | ICD-10-CM

## 2022-02-25 ENCOUNTER — Telehealth: Payer: Self-pay

## 2022-02-25 NOTE — Telephone Encounter (Signed)
Daughter called states she received message from [employer]ML[employer] that the dates and time she needs out were not filled on on ppw. States that it must be returned to them by 4/3.  ?She need to have listed as 2 days a week  ?

## 2022-02-25 NOTE — Telephone Encounter (Signed)
Called patients daughter to see what is excatly needed on the forms; after looking at them everything was filled at correctly. Kelly Kennedy stated that the FMLA is supposed to have days needed off on them; and they do. She also has a copy of what was turned in and is correct. She is going to reach out to the company to see what is needed and will call back.  ?

## 2022-02-28 NOTE — Telephone Encounter (Signed)
Darlene called and requested forms be refaxed. I have refaxed as requested. ?

## 2022-03-13 ENCOUNTER — Other Ambulatory Visit: Payer: Self-pay | Admitting: Cardiovascular Disease

## 2022-03-14 ENCOUNTER — Other Ambulatory Visit: Payer: Self-pay | Admitting: Family Medicine

## 2022-03-15 NOTE — Telephone Encounter (Signed)
Per EMR; looks like this was given by cardiology and has also been refilled by them in the past. Kelly Kennedy was refused by them and was told needed to be filled by PCP. Okay to refill? ?

## 2022-03-16 NOTE — Telephone Encounter (Signed)
Would continue.  Prescription sent.  Thanks. ?

## 2022-03-18 ENCOUNTER — Telehealth: Payer: Self-pay | Admitting: Family Medicine

## 2022-03-24 ENCOUNTER — Encounter: Payer: Self-pay | Admitting: Family Medicine

## 2022-03-24 ENCOUNTER — Ambulatory Visit (INDEPENDENT_AMBULATORY_CARE_PROVIDER_SITE_OTHER): Payer: Medicare Other | Admitting: Family Medicine

## 2022-03-24 VITALS — BP 130/62 | HR 80 | Temp 98.2°F | Resp 16 | Ht 63.0 in | Wt 176.2 lb

## 2022-03-24 DIAGNOSIS — J0100 Acute maxillary sinusitis, unspecified: Secondary | ICD-10-CM

## 2022-03-24 MED ORDER — AMOXICILLIN 500 MG PO CAPS: 1000.0000 mg | ORAL_CAPSULE | Freq: Two times a day (BID) | ORAL | 0 refills | Status: DC

## 2022-03-24 MED ORDER — FLUCONAZOLE 150 MG PO TABS: 150.0000 mg | ORAL_TABLET | Freq: Once | ORAL | 0 refills | Status: AC

## 2022-03-24 NOTE — Progress Notes (Signed)
Patient ID: Kelly Kennedy, female    DOB: Sep 07, 1941, 81 y.o.   MRN: [id number]  This visit was conducted in person.  BP 130/62   Pulse 80   Temp 98.2 F (36.8 C)   Resp 16   Ht '5\' 3"'$  (1.6 m)   Wt 176 lb 3 oz (79.9 kg)   SpO2 97%   BMI 31.21 kg/m    CC:  Chief Complaint  Patient presents with   URI    Coughing and the right side hurts when she coughs. X 2 weeks but did not get bad until day before yesterday.    Subjective:   HPI: Kelly Kennedy is a 81 y.o. female presenting on 03/24/2022 for URI (Coughing and the right side hurts when she coughs. X 2 weeks but did not get bad until day before yesterday.)   Date of  onset: 1 week ago.  Initially started with cough, productive,   Right face painful and swollen. No ear pain, no ST.  No fever,  some chills.  OCc shortness of breath, occ wheeze.   No chronic lung disease, not immunocompromised.   She has not  tried anything for symptoms.  Cough not keeping her up at night. Sick contact: none  COVID 19 screen COVID testing: none COVID vaccine: 09/13/2021 , 10/31/2020 , 01/26/2020 , 01/05/2020 COVID exposure: No recent travel or known exposure to [place]  The importance of social distancing was discussed today.        Relevant past medical, surgical, family and social history reviewed and updated as indicated. Interim medical history since our last visit reviewed. Allergies and medications reviewed and updated. Outpatient Medications Prior to Visit  Medication Sig Dispense Refill   amitriptyline (ELAVIL) 50 MG tablet Take 1-2 tablets (50-100 mg total) by mouth at bedtime as needed. for sleep 180 tablet 1   amLODipine (NORVASC) 5 MG tablet Take 5 mg by mouth daily.     aspirin EC 81 MG tablet Take 1 tablet (81 mg total) by mouth daily. Swallow whole. 90 tablet 3   carvedilol (COREG) 12.5 MG tablet TAKE 1 TABLET BY MOUTH 2 TIMES DAILY. 180 tablet 1   carvedilol (COREG) 6.25 MG tablet TAKE 1 TABLET BY MOUTH  TWICE A DAY 180 tablet 2   cyanocobalamin (,VITAMIN B-12,) 1000 MCG/ML injection Inject 1 mL (1,000 mcg total) into the muscle every 30 (thirty) days.     diclofenac Sodium (VOLTAREN) 1 % GEL Apply 2 g topically 4 (four) times daily as needed. 100 g 12   donepezil (ARICEPT ODT) 5 MG disintegrating tablet TAKE 1 TABLET BY MOUTH EVERYDAY AT BEDTIME 90 tablet 1   fluocinonide cream (LIDEX) 0.05 % APPLY TOPICALLY DAILY AS NEEDED. USE SPARINGLY. 30 g 1   isosorbide mononitrate (IMDUR) 30 MG 24 hr tablet TAKE 1 TABLET BY MOUTH EVERY DAY 90 tablet 2   levothyroxine (SYNTHROID) 75 MCG tablet TAKE 1 TABLET BY MOUTH DAILY BEFORE BREAKFAST. 90 tablet 1   Lifitegrast (XIIDRA) 5 % SOLN Apply 1 drop to eye in the morning and at bedtime. Twice eyes     LUMIGAN 0.01 % SOLN Place 1 drop into both eyes at bedtime.     nitrofurantoin (MACRODANTIN) 100 MG capsule Take 100 mg by mouth daily.     nitroGLYCERIN (NITROSTAT) 0.4 MG SL tablet Place 1 tablet (0.4 mg total) under the tongue every 5 (five) minutes as needed for chest pain. 25 tablet 1   omeprazole (PRILOSEC) 40 MG  capsule TAKE 1 CAPSULE (40 MG TOTAL) BY MOUTH DAILY. 90 capsule 0   REPATHA SURECLICK 683 MG/ML SOAJ ADMINISTER 1 ML UNDER THE SKIN EVERY 14 DAYS 2 mL 11   solifenacin (VESICARE) 5 MG tablet Take 1 tablet (5 mg total) by mouth daily. 90 tablet 3   No facility-administered medications prior to visit.     Per HPI unless specifically indicated in ROS section below Review of Systems  Constitutional:  Negative for fatigue and fever.  HENT:  Positive for congestion, postnasal drip, sinus pressure and sinus pain. Negative for ear pain.   Eyes:  Negative for pain.  Respiratory:  Positive for cough, shortness of breath and wheezing.   Cardiovascular:  Negative for chest pain, palpitations and leg swelling.  Gastrointestinal:  Negative for abdominal pain.  Genitourinary:  Negative for dysuria and vaginal bleeding.  Musculoskeletal:  Negative for back  pain.  Neurological:  Negative for syncope, light-headedness and headaches.  Psychiatric/Behavioral:  Negative for dysphoric mood.   Objective:  BP 130/62   Pulse 80   Temp 98.2 F (36.8 C)   Resp 16   Ht '5\' 3"'$  (1.6 m)   Wt 176 lb 3 oz (79.9 kg)   SpO2 97%   BMI 31.21 kg/m   Wt Readings from Last 3 Encounters:  03/24/22 176 lb 3 oz (79.9 kg)  02/08/22 178 lb (80.7 kg)  02/07/22 178 lb (80.7 kg)      Physical Exam Constitutional:      General: She is not in acute distress.    Appearance: She is well-developed. She is not ill-appearing or toxic-appearing.  HENT:     Head: Normocephalic.     Right Ear: Hearing, tympanic membrane, ear canal and external ear normal. Tympanic membrane is not erythematous, retracted or bulging.     Left Ear: Hearing, tympanic membrane, ear canal and external ear normal. Tympanic membrane is not erythematous, retracted or bulging.     Nose: Mucosal edema and rhinorrhea present.     Right Sinus: No maxillary sinus tenderness or frontal sinus tenderness.     Left Sinus: No maxillary sinus tenderness or frontal sinus tenderness.     Mouth/Throat:     Pharynx: Uvula midline.  Eyes:     General: Lids are normal. Lids are everted, no foreign bodies appreciated.     Conjunctiva/sclera: Conjunctivae normal.     Pupils: Pupils are equal, round, and reactive to light.  Neck:     Thyroid: No thyroid mass or thyromegaly.     Vascular: No carotid bruit.     Trachea: Trachea normal.  Cardiovascular:     Rate and Rhythm: Normal rate and regular rhythm.     Pulses: Normal pulses.     Heart sounds: Normal heart sounds, S1 normal and S2 normal. No murmur heard.   No friction rub. No gallop.  Pulmonary:     Effort: Pulmonary effort is normal. No tachypnea or respiratory distress.     Breath sounds: Normal breath sounds. No decreased breath sounds, wheezing, rhonchi or rales.  Musculoskeletal:     Cervical back: Normal range of motion and neck supple.  Skin:     General: Skin is warm and dry.     Findings: No rash.  Neurological:     Mental Status: She is alert.  Psychiatric:        Mood and Affect: Mood is not anxious or depressed.        Speech: Speech normal.  Behavior: Behavior normal. Behavior is cooperative.        Judgment: Judgment normal.      Results for orders placed or performed in visit on 02/14/22  Urine Culture   Specimen: Urine  Result Value Ref Range   MICRO NUMBER: [id number]    SPECIMEN QUALITY: Adequate    Sample Source NOT GIVEN    STATUS: FINAL    Result: No Growth   Urinalysis, Routine w reflex microscopic  Result Value Ref Range   Color, Urine YELLOW Yellow;Lt. Yellow;Straw;Dark Yellow;Amber;Green;Red;Brown   APPearance Sl Cloudy (A) Clear;Turbid;Slightly Cloudy;Cloudy   Specific Gravity, Urine >=1.030 (A) 1.000 - 1.030   pH 5.5 5.0 - 8.0   Total Protein, Urine NEGATIVE Negative   Urine Glucose NEGATIVE Negative   Ketones, ur NEGATIVE Negative   Bilirubin Urine NEGATIVE Negative   Hgb urine dipstick NEGATIVE Negative   Urobilinogen, UA 0.2 0.0 - 1.0   Leukocytes,Ua NEGATIVE Negative   Nitrite NEGATIVE Negative   WBC, UA 3-6/hpf (A) 0-2/hpf   RBC / HPF none seen 0-2/hpf   Mucus, UA Presence of (A) None   Squamous Epithelial / LPF Rare(0-4/hpf) Rare(0-4/hpf)   Amorphous Present (A) None;Present    This visit occurred during the SARS-CoV-2 public health emergency.  Safety protocols were in place, including screening questions prior to the visit, additional usage of staff PPE, and extensive cleaning of exam room while observing appropriate contact time as indicated for disinfecting solutions.   COVID 19 screen:  No recent travel or known exposure to COVID19 The patient denies respiratory symptoms of COVID 19 at this time. The importance of social distancing was discussed today.   Assessment and Plan Problem List Items Addressed This Visit     Acute non-recurrent maxillary sinusitis - Primary     Acute, worsening, > 7-10 days.   Start  mucinex  twice daily.  Start a course of amoxicillin.  Consider a COVID home test.  Can use flonase 2 sprays per nostril daily.      Relevant Medications   amoxicillin (AMOXIL) 500 MG capsule   Meds ordered this encounter  Medications   amoxicillin (AMOXIL) 500 MG capsule    Sig: Take 2 capsules (1,000 mg total) by mouth 2 (two) times daily.    Dispense:  40 capsule    Refill:  0   fluconazole (DIFLUCAN) 150 MG tablet    Sig: Take 1 tablet (150 mg total) by mouth once for 1 dose.    Dispense:  1 tablet    Refill:  0       Eliezer Lofts, MD

## 2022-03-24 NOTE — Patient Instructions (Signed)
Start  mucinex  twice daily. ? Start a course of amoxicillin. ? Consider a COVID home test. ? Can use flonase 2 sprays per nostril daily. ?

## 2022-04-20 ENCOUNTER — Other Ambulatory Visit: Payer: Self-pay | Admitting: Family Medicine

## 2022-04-20 DIAGNOSIS — Z1231 Encounter for screening mammogram for malignant neoplasm of breast: Secondary | ICD-10-CM

## 2022-05-01 ENCOUNTER — Other Ambulatory Visit: Payer: Self-pay | Admitting: Family Medicine

## 2022-05-01 ENCOUNTER — Telehealth: Payer: Self-pay | Admitting: Family Medicine

## 2022-05-01 DIAGNOSIS — E119 Type 2 diabetes mellitus without complications: Secondary | ICD-10-CM

## 2022-05-01 DIAGNOSIS — R413 Other amnesia: Secondary | ICD-10-CM

## 2022-05-01 DIAGNOSIS — E538 Deficiency of other specified B group vitamins: Secondary | ICD-10-CM

## 2022-05-01 NOTE — Telephone Encounter (Signed)
Please check the schedule for this patient.  I need her to have an OV, not a lab visit.  She can get labs done at the visit.  Doesn't need to fast.  Thanks.

## 2022-05-02 NOTE — Telephone Encounter (Signed)
LMTCB to schedule appt with Dr. Damita Dunnings; lab appt needs to be cancelled and will be done at appt.

## 2022-05-02 NOTE — Telephone Encounter (Signed)
Pt aware and scheduled.

## 2022-05-03 NOTE — Assessment & Plan Note (Signed)
Acute, worsening, > 7-10 days.   Start  mucinex  twice daily.  Start a course of amoxicillin.  Consider a COVID home test.  Can use flonase 2 sprays per nostril daily.

## 2022-05-11 ENCOUNTER — Other Ambulatory Visit: Payer: Medicare Other

## 2022-05-12 ENCOUNTER — Ambulatory Visit (INDEPENDENT_AMBULATORY_CARE_PROVIDER_SITE_OTHER): Payer: Medicare Other | Admitting: Family Medicine

## 2022-05-12 ENCOUNTER — Encounter: Payer: Self-pay | Admitting: Family Medicine

## 2022-05-12 VITALS — BP 120/68 | HR 82 | Temp 97.6°F | Ht 63.0 in | Wt 176.0 lb

## 2022-05-12 DIAGNOSIS — E538 Deficiency of other specified B group vitamins: Secondary | ICD-10-CM | POA: Diagnosis not present

## 2022-05-12 DIAGNOSIS — R195 Other fecal abnormalities: Secondary | ICD-10-CM

## 2022-05-12 DIAGNOSIS — I25118 Atherosclerotic heart disease of native coronary artery with other forms of angina pectoris: Secondary | ICD-10-CM | POA: Diagnosis not present

## 2022-05-12 DIAGNOSIS — R197 Diarrhea, unspecified: Secondary | ICD-10-CM | POA: Diagnosis not present

## 2022-05-12 DIAGNOSIS — E119 Type 2 diabetes mellitus without complications: Secondary | ICD-10-CM

## 2022-05-12 DIAGNOSIS — R413 Other amnesia: Secondary | ICD-10-CM

## 2022-05-12 NOTE — Progress Notes (Unsigned)
Memory follow up.  Patient and family have noted occ lapses, ie short term changes.  MMSE recheck today, see below.  H/o B12 def and due for labs today.    H/o diabetes:  No meds.   Hypoglycemic episodes: no Hyperglycemic episodes: no  Feet problems: no  F/u labs pending.   Diarrhea after eating.  Trouble leaving the house after a meal.  This is longstanding.  Prev IFOB pos with prev EGD and colonoscopy done.  No abd pain, no blood noted by patient.  Some greasy stools.    Lipids at goal.  She had used NTG occ "but not regular".  She had most recently used last week- not exertional but after a meal.  Other times it can be random on set, potentially at rest.   She has cardiology evaluation pending.    Meds, vitals, and allergies reviewed.   ROS: Per HPI unless specifically indicated in ROS section   GEN: nad, alert HEENT: ncat NECK: supple w/o LA CV: rrr. PULM: ctab, no inc wob ABD: soft, +bs EXT: no edema SKIN: Well-perfused. MMSE 25/30, -1 for day of the month, -1 for attention, -2 for recall, -1 for drawing interlocking pentagons.  30 minutes were devoted to patient care in this encounter (this includes time spent reviewing the patient's file/history, interviewing and examining the patient, counseling/reviewing plan with patient).

## 2022-05-12 NOTE — Patient Instructions (Addendum)
Go to the lab on the way out.   If you have mychart we'll likely use that to update you.    Take care.  Glad to see you. We'll see about when to get back together when I see your labs.   If you have any chest pain with exertion/walking then use NTG and get checked/seek help.

## 2022-05-13 LAB — CBC WITH DIFFERENTIAL/PLATELET
Basophils Absolute: 0.1 10*3/uL (ref 0.0–0.1)
Basophils Relative: 1.2 % (ref 0.0–3.0)
Eosinophils Absolute: 0 10*3/uL (ref 0.0–0.7)
Eosinophils Relative: 0.3 % (ref 0.0–5.0)
HCT: 36.2 % (ref 36.0–46.0)
Hemoglobin: 12.1 g/dL (ref 12.0–15.0)
Lymphocytes Relative: 51.1 % — ABNORMAL HIGH (ref 12.0–46.0)
Lymphs Abs: 2.4 10*3/uL (ref 0.7–4.0)
MCHC: 33.5 g/dL (ref 30.0–36.0)
MCV: 87.9 fl (ref 78.0–100.0)
Monocytes Absolute: 0.4 10*3/uL (ref 0.1–1.0)
Monocytes Relative: 8.4 % (ref 3.0–12.0)
Neutro Abs: 1.9 10*3/uL (ref 1.4–7.7)
Neutrophils Relative %: 39 % — ABNORMAL LOW (ref 43.0–77.0)
Platelets: 316 10*3/uL (ref 150.0–400.0)
RBC: 4.12 Mil/uL (ref 3.87–5.11)
RDW: 14.7 % (ref 11.5–15.5)
WBC: 4.8 10*3/uL (ref 4.0–10.5)

## 2022-05-13 LAB — HEMOGLOBIN A1C: Hgb A1c MFr Bld: 6.7 % — ABNORMAL HIGH (ref 4.6–6.5)

## 2022-05-13 LAB — BASIC METABOLIC PANEL
BUN: 14 mg/dL (ref 6–23)
CO2: 28 mEq/L (ref 19–32)
Calcium: 9.7 mg/dL (ref 8.4–10.5)
Chloride: 105 mEq/L (ref 96–112)
Creatinine, Ser: 0.87 mg/dL (ref 0.40–1.20)
GFR: 63 mL/min (ref >60.00)
Glucose, Bld: 86 mg/dL (ref 70–99)
Potassium: 4.2 mEq/L (ref 3.5–5.1)
Sodium: 141 mEq/L (ref 135–145)

## 2022-05-13 LAB — TSH: TSH: 0.27 u[IU]/mL — ABNORMAL LOW (ref 0.35–5.50)

## 2022-05-13 LAB — VITAMIN B12: Vitamin B-12: 508 pg/mL (ref 211–911)

## 2022-05-15 ENCOUNTER — Other Ambulatory Visit: Payer: Self-pay | Admitting: Family Medicine

## 2022-05-15 ENCOUNTER — Telehealth: Payer: Self-pay | Admitting: Family Medicine

## 2022-05-15 DIAGNOSIS — E119 Type 2 diabetes mellitus without complications: Secondary | ICD-10-CM

## 2022-05-15 NOTE — Assessment & Plan Note (Signed)
Continue Repatha and use nitroglycerin as needed with routine cautions.  I am going to update cardiology.  See after visit summary.  Chest pain-free at exam.

## 2022-05-15 NOTE — Assessment & Plan Note (Signed)
See notes on labs. 

## 2022-05-15 NOTE — Assessment & Plan Note (Addendum)
MMSE 25/30, -1 for day of the month, -1 for attention, -2 for recall, -1 for drawing interlocking pentagons.  We need to recheck basic labs including B12.  See notes on labs.  No change in medications at this point.

## 2022-05-15 NOTE — Telephone Encounter (Signed)
FYI about this patient with some memory loss.  She is occasionally use nitroglycerin for discomfort.  It does not sound like her discomfort was exertional.  It may be GI in origin but I told her if she had recurrent pain/nitroglycerin use to seek evaluation.  I routed this to you as FYI for your upcoming appointment.  Thanks.

## 2022-05-15 NOTE — Assessment & Plan Note (Signed)
Check routine labs today along with fecal elastase.  If she has a low level we can potentially treat that with Creon.  Otherwise she may benefit from using Questran.  Discussed.  See notes on labs.

## 2022-05-15 NOTE — Assessment & Plan Note (Signed)
History of, see notes on A1c.  No medications currently.

## 2022-05-24 ENCOUNTER — Ambulatory Visit
Admission: RE | Admit: 2022-05-24 | Discharge: 2022-05-24 | Disposition: A | Discharge status: Home / Self Care | Payer: Medicare Other | Source: Ambulatory Visit | Attending: Family Medicine | Admitting: Family Medicine

## 2022-05-24 DIAGNOSIS — Z1231 Encounter for screening mammogram for malignant neoplasm of breast: Secondary | ICD-10-CM

## 2022-05-25 ENCOUNTER — Other Ambulatory Visit: Payer: Medicare Other

## 2022-05-25 DIAGNOSIS — R195 Other fecal abnormalities: Secondary | ICD-10-CM

## 2022-06-01 LAB — PANCREATIC ELASTASE, FECAL: Pancreatic Elastase-1, Stool: 411 mcg/g

## 2022-06-01 NOTE — Progress Notes (Unsigned)
Cardiology Clinic Note   Patient Name: Kelly Kennedy Date of Encounter: 06/02/2022  Primary Care Provider:  [name], MD Primary Cardiologist:  Sanda Klein, MD  Patient Profile    Kelly Kennedy 81 year old female presents the clinic today for follow-up evaluation of her essential hypertension and coronary artery disease.  Past Medical History    Past Medical History:  Diagnosis Date   adenomatous Colon polyps    colonoscopy 8/99, 12/02, 8/06   Arthritis    Bell's palsy    Cancer (HCC)    DCIS left breast   Cataract    Chest pain    uses NTG as needed   Depression    Diabetes mellitus without complication ([place])    Diverticulosis of colon 06/2005   Ductal carcinoma in situ (DCIS) of left breast    Family history of adverse reaction to anesthesia    " my mother didn't wake up so they had to put her on life support for about an hour or more; my sisiter has problems waking up too from anesthesia.   GERD (gastroesophageal reflux disease)    Glaucoma    H/O hiatal hernia    Headache(784.0)    Hyperglycemia    Hyperlipidemia    Hypertension    Hypothyroid    Insomnia    Irritable bowel    Menopausal symptoms    Pericarditis 1980's   Stroke [facility])    TIA (transient ischemic attack)    09/2014   Past Surgical History:  Procedure Laterality Date   APPENDECTOMY     BREAST EXCISIONAL BIOPSY Right    benign   BREAST RECONSTRUCTION WITH PLACEMENT OF TISSUE EXPANDER AND FLEX HD (ACELLULAR HYDRATED DERMIS) Left 08/06/2015   Procedure: LEFT BREAST RECONSTRUCTION WITH PLACEMENT OF SALINE IMPLANT AND ACELLULAR DURMAL MATRIX;  Surgeon: Crissie Reese, MD;  Location: [place];  Service: Plastics;  Laterality: Left;   CARDIAC CATHETERIZATION     CATARACT EXTRACTION Bilateral 2016   CHOLECYSTECTOMY  1990   COLONOSCOPY     CYSTECTOMY  05/04/2008   left lower arm, benign   ECTOPIC PREGNANCY SURGERY  1971   FRACTURE SURGERY     nose   LEFT HEART CATH AND  CORONARY ANGIOGRAPHY [name] 09/21/2018   Procedure: LEFT HEART CATH AND CORONARY ANGIOGRAPHY;  Surgeon: Leonie Man, MD;  Location: [place] [facility];  Service: Cardiovascular;  Laterality: [name];   MASTECTOMY Left 2016   NIPPLE SPARING MASTECTOMY Left 08/06/2015   Procedure: LEFT NIPPLE SPARING MASTECTOMY;  Surgeon: [name], MD;  Location: [place];  Service: General;  Laterality: Left;   ORIF ANKLE FRACTURE Left 10/23/2013   Procedure: OPEN REDUCTION INTERNAL FIXATION (ORIF) LEFT ANKLE FRACTURE;  Surgeon: Marianna Payment, MD;  Location: [place];  Service: Orthopedics;  Laterality: Left;   PARTIAL HYSTERECTOMY     ovaries intact   PARTIAL MASTECTOMY WITH NEEDLE LOCALIZATION AND AXILLARY SENTINEL LYMPH NODE BX Left 05/06/2015   Procedure: LEFT BREAST PARTIAL MASTECTOMY WITH NEEDLE LOCALIZATION AND SENTINEL LYMPH NODE MAPPING;  Surgeon: [name], MD;  Location: [place];  Service: General;  Laterality: Left;   TONSILLECTOMY     TUBAL LIGATION      Allergies  Allergies  Allergen Reactions   Ace Inhibitors Swelling    H/o lip swelling with change in losartan pill   Angiotensin Receptor Blockers Swelling    H/o lip swelling with change in losartan pill   Statins Anaphylaxis  myalgias   Flonase [Fluticasone Propionate] Other (See Comments)    Intolerant of smell   Glipizide Other (See Comments)    unstready when on med.     Iron Other (See Comments)    Bloating with oral iron   Sulfa Antibiotics Other (See Comments)    Intolerant of sulfa but able to tolerate trimethoprim   Niacin Rash   Red Dye Rash    History of Present Illness    Kelly Kennedy has a PMH of essential hypertension, coronary artery disease, PVCs, GERD, fatty liver, dysphagia, hypothyroidism, obesity, anxiety, and hyperlipidemia.  Her PMH also includes nonsustained atrial tachycardia, previous TIA, and probable previous pericarditis.  Coronary CT 10/19 showed high-grade stenosis  in her mid left circumflex with FFR of 0.75.  This showed a calcium score of 274.  Cardiac catheterization did not confirm the presence of single severe stenosis of circumflex artery.  She was noted to have 45% stenosed distal circumflex lesion.  LVEF was 65%.  She was seen by Dr. Sallyanne Kuster on 10/18/2021.  She reported taking occasional sublingual nitroglycerin for unpredictable episodes of chest tightness.  The nitroglycerin helps her symptoms resolve however, it takes about 15 minutes before the discomfort to go away.  She notices discomfort with doing housework like mopping or vacuuming.  She notes that her diastolic blood pressure at home is oftentimes in the 40s.  Her blood pressure in the office during her visit was 140/80.  She denied chest pain and dizziness.  She presents to the clinic today for follow-up evaluation states she has not noticed any chest discomfort while doing increased physical activity.  She does note occasional chest discomfort after eating.  She reports that she will lay down and her chest discomfort will go away.  She does use occasional sublingual nitroglycerin which helps her chest discomfort dissipate after about 15-20 minutes.  She reports that she is fairly sedentary.  Her daughter presents with her and indicates that she eats fried food regularly.  We reviewed her previous coronary CTA and cholesterol.  Her cholesterol is fairly well controlled with an LDL cholesterol of 57 on 10/04/2021.  We reviewed the importance of high-fiber diet.  I will order a coronary CTA, have her increase her physical activity as tolerated, and plan follow-up for 6 months.  Today she denies chest pain, shortness of breath, lower extremity edema, fatigue, palpitations, melena, hematuria, hemoptysis, diaphoresis, weakness, presyncope, syncope, orthopnea, and PND.   Home Medications    Prior to Admission medications   Medication Sig Start Date End Date Taking? Authorizing Provider  amitriptyline  (ELAVIL) 50 MG tablet Take 1-2 tablets (50-100 mg total) by mouth at bedtime as needed. for sleep 02/07/22   [name], MD  amLODipine (NORVASC) 5 MG tablet Take 5 mg by mouth daily. 04/24/21   [provider]  aspirin EC 81 MG tablet Take 1 tablet (81 mg total) by mouth daily. Swallow whole. 02/12/21   Croitoru, Mihai, MD  carvedilol (COREG) 12.5 MG tablet TAKE 1 TABLET BY MOUTH 2 TIMES DAILY. 02/14/22   Croitoru, Mihai, MD  cyanocobalamin (,VITAMIN B-12,) 1000 MCG/ML injection Inject 1 mL (1,000 mcg total) into the muscle every 30 (thirty) days. 07/07/21   [name], MD  diclofenac Sodium (VOLTAREN) 1 % GEL Apply 2 g topically 4 (four) times daily as needed. 10/04/21   [name], MD  donepezil (ARICEPT ODT) 5 MG disintegrating tablet TAKE 1 TABLET BY MOUTH EVERYDAY AT BEDTIME 03/16/22  [name], MD  fluocinonide cream (LIDEX) 0.05 % APPLY TOPICALLY DAILY AS NEEDED. USE SPARINGLY. 02/13/22   [name], MD  isosorbide mononitrate (IMDUR) 30 MG 24 hr tablet TAKE 1 TABLET BY MOUTH EVERY DAY 02/22/22   Croitoru, Mihai, MD  levothyroxine (SYNTHROID) 75 MCG tablet TAKE 1 TABLET BY MOUTH DAILY BEFORE BREAKFAST. 12/24/21   [name], MD  Lifitegrast Shirley Friar) 5 % SOLN Apply 1 drop to eye in the morning and at bedtime. Twice eyes    [provider]  LUMIGAN 0.01 % SOLN Place 1 drop into both eyes at bedtime. 06/29/19   [provider]  nitroGLYCERIN (NITROSTAT) 0.4 MG SL tablet Place 1 tablet (0.4 mg total) under the tongue every 5 (five) minutes as needed for chest pain. 02/12/21   Croitoru, Mihai, MD  omeprazole (PRILOSEC) 40 MG capsule TAKE 1 CAPSULE (40 MG TOTAL) BY MOUTH DAILY. 02/14/22   Ladene Artist, MD  REPATHA SURECLICK 270 MG/ML SOAJ ADMINISTER 1 ML UNDER THE SKIN EVERY 14 DAYS 09/14/21   Croitoru, Dani Gobble, MD  solifenacin (VESICARE) 5 MG tablet Take 1 tablet (5 mg total) by mouth daily. 10/04/21   [name], MD    Family History     Family History  Problem Relation Age of Onset   Cancer Father        stomach    Stomach cancer Father    Colon cancer Father    Heart disease Son        MI   Arthritis Mother    Diabetes Mother    Cancer Brother        lung cancer   Pancreatic cancer Neg Hx    Breast cancer Neg Hx    Esophageal cancer Neg Hx    Rectal cancer Neg Hx    She indicated that her mother is deceased. She indicated that her father is deceased. She indicated that her brother is deceased. She indicated that her son is deceased. She indicated that the status of her neg hx is unknown.  Social History    Social History   Socioeconomic History   Marital status: Widowed    Spouse name: Not on file   Number of children: 3   Years of education: Not on file   Highest education level: Not on file  Occupational History   Occupation: retired, previous Engineer, manufacturing systems  Tobacco Use   Smoking status: Former    Types: Cigarettes    Quit date: 04/03/2017    Years since quitting: 5.1   Smokeless tobacco: Former   Tobacco comments:    [place].  Vaping Use   Vaping Use: Never used  Substance and Sexual Activity   Alcohol use: No    Alcohol/week: 0.0 standard drinks of alcohol   Drug use: No   Sexual activity: Never  Other Topics Concern   Not on file  Social History Narrative   From [place].   3 children, one died from MI in February 10, 2005   Married 02-11-1959   Husband and brother died in the same week in 12-Nov-2012      Patient is right handed.   Patient has hs education.   Patient drinks 1 cup of soda daily, and green tea.   Social Determinants of Health   Financial Resource Strain: Low Risk  (02/10/22)   Overall Financial Resource Strain (CARDIA)    Difficulty of Paying Living Expenses: Not hard at all  Food  Insecurity: Food Insecurity Present (02/11/2022)   Hunger Vital Sign    Worried About Running Out of Food in the Last Year: Sometimes true    Ran Out of Food in the Last Year:  Sometimes true  Transportation Needs: No Transportation Needs (02/08/2022)   PRAPARE - Hydrologist (Medical): No    Lack of Transportation (Non-Medical): No  Physical Activity: Inactive (02/08/2022)   Exercise Vital Sign    Days of Exercise per Week: 0 days    Minutes of Exercise per Session: 0 min  Stress: No Stress Concern Present (02/08/2022)   [place]    Feeling of Stress : Only a little  Social Connections: Moderately Isolated (02/08/2022)   Social Connection and Isolation Panel [NHANES]    Frequency of Communication with Friends and Family: More than three times a week    Frequency of Social Gatherings with Friends and Family: More than three times a week    Attends Religious Services: 1 to 4 times per year    Active Member of [employer] or Organizations: No    Attends Archivist Meetings: Never    Marital Status: Widowed  Intimate Partner Violence: Not At Risk (02/08/2022)   Humiliation, Afraid, Rape, and Kick questionnaire    Fear of Current or Ex-Partner: No    Emotionally Abused: No    Physically Abused: No    Sexually Abused: No     Review of Systems    General:  No chills, fever, night sweats or weight changes.  Cardiovascular:  No chest pain, dyspnea on exertion, edema, orthopnea, palpitations, paroxysmal nocturnal dyspnea. Dermatological: No rash, lesions/masses Respiratory: No cough, dyspnea Urologic: No hematuria, dysuria Abdominal:   No nausea, vomiting, diarrhea, bright red blood per rectum, melena, or hematemesis Neurologic:  No visual changes, wkns, changes in mental status. All other systems reviewed and are otherwise negative except as noted above.  Physical Exam    VS:  BP 128/82   Pulse 81   Ht '5\' 5"'$  (1.651 m)   Wt 178 lb 9.6 oz (81 kg)   SpO2 97%   BMI 29.72 kg/m  , BMI Body mass index is 29.72 kg/m. GEN: Well nourished, well developed, in no  acute distress. HEENT: normal. Neck: Supple, no JVD, carotid bruits, or masses. Cardiac: RRR, no murmurs, rubs, or gallops. No clubbing, cyanosis, generalized bilateral lower extremity nonpitting edema.  Radials/DP/PT 2+ and equal bilaterally.  Respiratory:  Respirations regular and unlabored, clear to auscultation bilaterally. GI: Soft, nontender, nondistended, BS + x 4. MS: no deformity or atrophy. Skin: warm and dry, no rash. Neuro:  Strength and sensation are intact. Psych: Normal affect.  Accessory Clinical Findings    Recent Labs: 10/04/2021: ALT 11 05/12/2022: BUN 14; Creatinine, Ser 0.87; Hemoglobin 12.1; Platelets 316.0; Potassium 4.2; Sodium 141; TSH 0.27   Recent Lipid Panel    Component Value Date/Time   CHOL 150 10/04/2021 1632   CHOL 121 10/30/2019 0915   TRIG 115.0 10/04/2021 1632   TRIG 203 10/03/2012 0000   HDL 70.50 10/04/2021 1632   HDL 72 10/30/2019 0915   CHOLHDL 2 10/04/2021 1632   VLDL 23.0 10/04/2021 1632   LDLCALC 57 10/04/2021 1632   LDLCALC 26 10/30/2019 0915   LDLDIRECT 154.0 10/27/2017 0756    ECG personally reviewed by me today-normal sinus rhythm LVH possible lateral infarct undetermined age 39 bpm- No acute changes  Cardiac catheterization 09/21/2018 Dist Cx lesion  is 45% stenosed -angiographically moderate at best disease. Does not explain CT scan. There is hyperdynamic left ventricular systolic function. The left ventricular ejection fraction is greater than 65% by visual estimate. LV end diastolic pressure is normal.   Mild to moderate disease distal dominant Circumflex.  There is no culprit lesion to explain CT scan findings. Otherwise essentially normal coronary arteries and a left dominant system. Normal LVEF with hyperdynamic function.  The patient had a brief run of PAT/SVT during injection.   Patient will return to short stay holding area for ongoing care. Would recommend beta-blocker.  The patient felt palpitations with PVCs and  brief runs of PAT with left ventricular hemodynamics and LV gram.  She describes her chest discomfort as the symptoms noted with documented ectopy and brief PAT runs.   She will be ready for discharge home after bedrest.   Recommend Aspirin '81mg'$  daily for moderate CAD.      Glenetta Hew, MD  Diagnostic Dominance: Left  Intervention   Assessment & Plan   1.  Coronary artery disease-continues to have brief intermittent episodes of chest discomfort.  Relieved by sublingual nitroglycerin and rest. Continue amlodipine, aspirin, carvedilol, Imdur, nitroglycerin, Repatha Heart healthy low-sodium diet-salty 6 given Increase physical activity as tolerated Repeat coronary CTA  Hyperlipidemia-LDL 57 on 10/04/21 Continue Repatha, aspirin Heart healthy low-sodium high-fiber diet Increase physical activity as tolerated  Essential hypertension-BP today 128/82.  Reports blood pressures at home in the 140s over 80s.  I have asked her to bring her blood pressure cuff to the next visit. Continue amlodipine, carvedilol, Imdur Heart healthy low-sodium diet-salty 6 given Increase physical activity as tolerated  Type 2 diabetes-glucose 86 on 05/12/2022. Heart healthy low-sodium carb modified diet Increase physical activity as tolerated Follows with PCP  Disposition: Follow-up with Dr. Sallyanne Kuster or me in 6 months.     Jossie Ng. Emmett Bracknell NP-C     06/02/2022, 11:45 AM [address] Office [phone] Fax [phone]  Notice: This dictation was prepared with Dragon dictation along with smaller phrase technology. Any transcriptional errors that result from this process are unintentional and may not be corrected upon review.  I spent 14 minutes examining this patient, reviewing medications, and using patient centered shared decision making involving her cardiac care.  Prior to her visit I spent greater than 20 minutes reviewing her past medical  history,  medications, and prior cardiac tests.

## 2022-06-02 ENCOUNTER — Encounter: Payer: Self-pay | Admitting: General Practice

## 2022-06-02 ENCOUNTER — Ambulatory Visit (INDEPENDENT_AMBULATORY_CARE_PROVIDER_SITE_OTHER): Payer: Medicare Other | Admitting: General Practice

## 2022-06-02 VITALS — BP 128/82 | HR 81 | Ht 65.0 in | Wt 178.6 lb

## 2022-06-02 DIAGNOSIS — I25118 Atherosclerotic heart disease of native coronary artery with other forms of angina pectoris: Secondary | ICD-10-CM

## 2022-06-02 DIAGNOSIS — E119 Type 2 diabetes mellitus without complications: Secondary | ICD-10-CM

## 2022-06-02 DIAGNOSIS — I10 Essential (primary) hypertension: Secondary | ICD-10-CM | POA: Diagnosis not present

## 2022-06-02 DIAGNOSIS — E7800 Pure hypercholesterolemia, unspecified: Secondary | ICD-10-CM | POA: Diagnosis not present

## 2022-06-02 MED ORDER — NITROGLYCERIN 0.4 MG SL SUBL
0.4000 mg | SUBLINGUAL_TABLET | SUBLINGUAL | 1 refills | Status: DC | PRN
Start: 2022-06-02 — End: 2022-08-03

## 2022-06-02 NOTE — Patient Instructions (Signed)
Medication Instructions:  REFILLED NITRO  The current medical regimen is effective;  continue present plan and medications as directed. Please refer to the Current Medication list given to you today.   *If you need a refill on your cardiac medications before your next appointment, please call your pharmacy*  Lab Work: BMET TODAY  If you have labs (blood work) drawn today and your tests are completely normal, you will receive your results only by: [place] (if you have MyChart) OR A paper copy in the mail If you have any lab test that is abnormal or we need to change your treatment, we will call you to review the results.  Testing/Procedures: CT scanning, (CAT scanning), is a noninvasive, special x-ray that produces cross-sectional images of the body using x-rays and a computer. CT scans help physicians diagnose and treat medical conditions. For some CT exams, a contrast material is used to enhance visibility in the area of the body being studied. CT scans provide greater clarity and reveal more details than regular x-ray exams.  TAKE AND LOG YOUR BLOOD PRESSURE-AT 6 MONTH FOLLOW UP MAKE SURE TO BRING YOUR BLOOD PRESSURE LOG  PLEASE PURCHASE AND WEAR COMPRESSION STOCKINGS DAILY AND TAKE OFF AT BEDTIME. Compression stockings are elastic socks that squeeze the legs. They help to increase blood flow to the legs and to decrease swelling in the legs from fluid retention, and reduce the chance of developing blood clots in the lower legs. Please put on in the AM when dressing and off at night when dressing for bed.  LET THEM KNOW THAT YOU NEED KNEE HIGH'S WITH COMPRESSION OF 15-20 mmhg.  [employer]  [place] ([place] [phone]); [place], [place] [phone]; [phone]  EMAIL   [email or web address].  PLEASE MAKE SURE TO ELEVATE YOUR FEET & LEGS WHILE SITTING, THIS WILL HELP WITH THE SWELLING ALSO.   Special Instructions  PLEASE READ AND FOLLOW SALTY 6-ATTACHED-1,800 mg  daily  Follow-Up: Your next appointment:  6 month(s) In Person with Sanda Klein, MD    Please call our office 2 months in advance to schedule this appointment 1  At [facility], you and your health needs are our priority.  As part of our continuing mission to provide you with exceptional heart care, we have created designated Provider Care Teams.  These Care Teams include your primary Cardiologist (physician) and Advanced Practice Providers (APPs -  Physician Assistants and Nurse Practitioners) who all work together to provide you with the care you need, when you need it.  We recommend signing up for the patient portal called "MyChart".  Sign up information is provided on this After Visit Summary.  MyChart is used to connect with patients for Virtual Visits (Telemedicine).  Patients are able to view lab/test results, encounter notes, upcoming appointments, etc.  Non-urgent messages can be sent to your provider as well.   To learn more about what you can do with MyChart, go to [email or web address].    Important Information About Sugar

## 2022-06-03 ENCOUNTER — Other Ambulatory Visit: Payer: Self-pay | Admitting: Gastroenterology

## 2022-06-03 ENCOUNTER — Other Ambulatory Visit: Payer: Self-pay | Admitting: Family Medicine

## 2022-06-03 DIAGNOSIS — K219 Gastro-esophageal reflux disease without esophagitis: Secondary | ICD-10-CM

## 2022-06-03 LAB — BASIC METABOLIC PANEL
BUN/Creatinine Ratio: 14 (ref 12–28)
BUN: 11 mg/dL (ref 8–27)
CO2: 25 mmol/L (ref 20–29)
Calcium: 9.7 mg/dL (ref 8.7–10.3)
Chloride: 102 mmol/L (ref 96–106)
Creatinine, Ser: 0.8 mg/dL (ref 0.57–1.00)
Glucose: 118 mg/dL — ABNORMAL HIGH (ref 70–99)
Potassium: 4.2 mmol/L (ref 3.5–5.2)
Sodium: 140 mmol/L (ref 134–144)
eGFR: 74 mL/min/{1.73_m2} (ref >59)

## 2022-06-06 ENCOUNTER — Other Ambulatory Visit: Payer: Self-pay | Admitting: Family Medicine

## 2022-06-06 MED ORDER — CHOLESTYRAMINE 4 G PO PACK: 4.0000 g | PACK | Freq: Three times a day (TID) | ORAL | 2 refills | Status: DC

## 2022-06-16 ENCOUNTER — Other Ambulatory Visit: Payer: Self-pay | Admitting: Gastroenterology

## 2022-06-16 ENCOUNTER — Telehealth: Payer: Self-pay | Admitting: Gastroenterology

## 2022-06-16 DIAGNOSIS — K219 Gastro-esophageal reflux disease without esophagitis: Secondary | ICD-10-CM

## 2022-06-16 MED ORDER — OMEPRAZOLE 40 MG PO CPDR: 40.0000 mg | DELAYED_RELEASE_CAPSULE | Freq: Every day | ORAL | 0 refills | Status: DC

## 2022-06-16 NOTE — Telephone Encounter (Signed)
Prescription for omeprazole has been sent to patient's pharmacy until scheduled appt.

## 2022-06-16 NOTE — Telephone Encounter (Signed)
Inbound call from patients daughter stating patient needs a refill for Prilosec. Patient was scheduled for 8/16 at 3:00 with Vicie Mutters. Please advise.

## 2022-06-18 ENCOUNTER — Other Ambulatory Visit: Payer: Self-pay | Admitting: Family Medicine

## 2022-06-20 MED ORDER — OMEPRAZOLE 40 MG PO CPDR: 40.0000 mg | DELAYED_RELEASE_CAPSULE | Freq: Every day | ORAL | 0 refills | Status: DC

## 2022-06-20 NOTE — Addendum Note (Signed)
Addended by: Dorisann Frames L on: 06/20/2022 03:43 PM   Modules accepted: Orders

## 2022-06-20 NOTE — Telephone Encounter (Signed)
Patient daughter called back. Said she checked with [facility] yesterday and they don't have it.

## 2022-07-12 DIAGNOSIS — Z961 Presence of intraocular lens: Secondary | ICD-10-CM | POA: Diagnosis not present

## 2022-07-12 DIAGNOSIS — H353131 Nonexudative age-related macular degeneration, bilateral, early dry stage: Secondary | ICD-10-CM | POA: Diagnosis not present

## 2022-07-12 DIAGNOSIS — H401131 Primary open-angle glaucoma, bilateral, mild stage: Secondary | ICD-10-CM | POA: Diagnosis not present

## 2022-07-12 DIAGNOSIS — E119 Type 2 diabetes mellitus without complications: Secondary | ICD-10-CM | POA: Diagnosis not present

## 2022-07-12 DIAGNOSIS — H0288A Meibomian gland dysfunction right eye, upper and lower eyelids: Secondary | ICD-10-CM | POA: Diagnosis not present

## 2022-07-12 DIAGNOSIS — H0288B Meibomian gland dysfunction left eye, upper and lower eyelids: Secondary | ICD-10-CM | POA: Diagnosis not present

## 2022-07-12 NOTE — Progress Notes (Unsigned)
07/13/2022 Kelly Kennedy [id number] 03/20/1941  Referring provider: [name], MD Primary GI doctor: Dr. Fuller Plan  ASSESSMENT AND PLAN:   Assessment: 81 y.o. female here for assessment of the following: 1. Gastroesophageal reflux disease, unspecified whether esophagitis present   2. Esophageal stricture   3. Diverticulosis of colon   4. Coronary artery disease involving coronary bypass graft with angina pectoris, unspecified whether native or transplanted heart ([place])   No dysphagia, has been off PPI x 1 month and having worsening GERD, does well on it.   Diarrhea does well with bile acid sequestrant, normal pancreatic elastase, recent abnormal TSH.   Plan: she reports symptoms are currently well controlled. Lifestyle changes discussed, avoid NSAIDS Continue current medications Any worsening symptoms of dysphagia, consider repeat EGD Add fiber Recheck TSH with PCP Avoid lactulose. FODMAP and lifestyle changes discussed.  Follow up 6 months  History of Present Illness:  81 y.o. female  with a past medical history of depression, breast cancer, hypertension, coronary artery disease, pericarditis, hyperlipidemia, diabetes mellitus type 2, CVA, TIA, GERD and colon polyps and others listed below, returns to clinic today for evaluation of medication refill.  11/24/2020 OV with Colleen,NP for dysphagia 03/02/2021 EGD with Dr. Fuller Plan for dysphagia showed benign-appearing esophageal stenosis dilated to 17 mm, small hiatal hernia, erosive gastropathy, normal duodenal bulb.  Pathology negative for H. pylori or dysplasia EGD 07/21/2005 showed a normal esophagus which was dilated with a Savary dilator to 18 mm, a small hiatal hernia and gastritis  colonoscopy 09/2019 and 4 tubular adenomatous polyps were removed from the colon.   Patient is on omeprazole 40 mg once daily, she normally takes before food. She has been out of it for a month.  Questran 4 g on it 2 times daily, she  has up to 4 a day loose, and on a good day can have up to 2 days.  She normally has diarrhea with food normally, occ oily stools, occ AB bloating.  No fever, no chills, no hematochezia, no melena.  No weight loss.  Negative pancreatic elastase 05/25/2022. Wt Readings from Last 6 Encounters:  07/13/22 178 lb (80.7 kg)  06/02/22 178 lb 9.6 oz (81 kg)  05/12/22 176 lb (79.8 kg)  03/24/22 176 lb 3 oz (79.9 kg)  02/08/22 178 lb (80.7 kg)  02/07/22 178 lb (80.7 kg)      She  reports that she quit smoking about 5 years ago. Her smoking use included cigarettes. She has quit using smokeless tobacco. She reports that she does not drink alcohol and does not use drugs. Her family history includes Arthritis in her mother; Cancer in her brother and father; Colon cancer in her father; Diabetes in her mother; Heart disease in her son; Stomach cancer in her father.   Current Medications:   Current Outpatient Medications (Endocrine & Metabolic):    levothyroxine (SYNTHROID) 75 MCG tablet, TAKE 1 TABLET BY MOUTH EVERY DAY BEFORE BREAKFAST  Current Outpatient Medications (Cardiovascular):    amLODipine (NORVASC) 5 MG tablet, Take 5 mg by mouth daily.   carvedilol (COREG) 12.5 MG tablet, TAKE 1 TABLET BY MOUTH 2 TIMES DAILY.   cholestyramine (QUESTRAN) 4 g packet, Take 1 packet (4 g total) by mouth 3 (three) times daily with meals.   isosorbide mononitrate (IMDUR) 30 MG 24 hr tablet, TAKE 1 TABLET BY MOUTH EVERY DAY   nitroGLYCERIN (NITROSTAT) 0.4 MG SL tablet, Place 1 tablet (0.4 mg total) under the tongue every 5 (five) minutes  as needed for chest pain.   REPATHA SURECLICK 782 MG/ML SOAJ, ADMINISTER 1 ML UNDER THE SKIN EVERY 14 DAYS   Current Outpatient Medications (Analgesics):    aspirin EC 81 MG tablet, Take 1 tablet (81 mg total) by mouth daily. Swallow whole.  Current Outpatient Medications (Hematological):    cyanocobalamin (,VITAMIN B-12,) 1000 MCG/ML injection, Inject 1 mL (1,000 mcg  total) into the muscle every 30 (thirty) days.  Current Outpatient Medications (Other):    amitriptyline (ELAVIL) 50 MG tablet, Take 1-2 tablets (50-100 mg total) by mouth at bedtime as needed. for sleep   diclofenac Sodium (VOLTAREN) 1 % GEL, Apply 2 g topically 4 (four) times daily as needed.   donepezil (ARICEPT ODT) 5 MG disintegrating tablet, TAKE 1 TABLET BY MOUTH EVERYDAY AT BEDTIME   fluocinonide cream (LIDEX) 0.05 %, APPLY TOPICALLY DAILY AS NEEDED. USE SPARINGLY.   Lifitegrast (XIIDRA) 5 % SOLN, Apply 1 drop to eye in the morning and at bedtime. Twice eyes   LUMIGAN 0.01 % SOLN, Place 1 drop into both eyes at bedtime.   nitrofurantoin (MACRODANTIN) 100 MG capsule, Take 100 mg by mouth daily.   omeprazole (PRILOSEC) 40 MG capsule, Take 1 capsule (40 mg total) by mouth daily.   solifenacin (VESICARE) 5 MG tablet, Take 1 tablet (5 mg total) by mouth daily.  Surgical History:  She  has a past surgical history that includes Partial hysterectomy; Cholecystectomy (1990); Ectopic pregnancy surgery (1971); Cystectomy (05/04/2008); Tonsillectomy; Appendectomy; Cardiac catheterization; Fracture surgery; Tubal ligation; ORIF ankle fracture (Left, 10/23/2013); Cataract extraction (Bilateral, 2016); Partial mastectomy with needle localization and axillary sentinel lymph node bx (Left, 05/06/2015); Nipple sparing mastectomy (Left, 08/06/2015); Breast reconstruction with placement of tissue expander and flex hd (acellular hydrated dermis) (Left, 08/06/2015); LEFT HEART CATH AND CORONARY ANGIOGRAPHY ([name], 09/21/2018); Breast excisional biopsy (Right); Mastectomy (Left, 2016); and Colonoscopy.  Current Medications, Allergies, Past Medical History, Past Surgical History, Family History and Social History were reviewed in [employer] record.  Physical Exam: BP (!) 156/86   Pulse 90   Ht '5\' 5"'$  (1.651 m)   Wt 178 lb (80.7 kg)   BMI 29.62 kg/m  General:   Pleasant, well developed  female in no acute distress Heart : Regular rate and rhythm; no murmurs Pulm: Clear anteriorly; no wheezing Abdomen:  Soft, Obese AB, Active bowel sounds. No tenderness . Without guarding and Without rebound, No organomegaly appreciated. Rectal: Not evaluated Extremities:  without  edema. Neurologic:  Alert and  oriented x4;  No focal deficits.  Psych:  Cooperative. Normal mood and affect.   Vladimir Crofts, PA-C 07/13/22

## 2022-07-13 ENCOUNTER — Encounter: Payer: Self-pay | Admitting: Physician Assistant

## 2022-07-13 ENCOUNTER — Ambulatory Visit (INDEPENDENT_AMBULATORY_CARE_PROVIDER_SITE_OTHER): Payer: Medicare Other | Admitting: Physician Assistant

## 2022-07-13 VITALS — BP 156/86 | HR 90 | Ht 65.0 in | Wt 178.0 lb

## 2022-07-13 DIAGNOSIS — K219 Gastro-esophageal reflux disease without esophagitis: Secondary | ICD-10-CM

## 2022-07-13 DIAGNOSIS — R197 Diarrhea, unspecified: Secondary | ICD-10-CM | POA: Diagnosis not present

## 2022-07-13 DIAGNOSIS — K5730 Diverticulosis of large intestine without perforation or abscess without bleeding: Secondary | ICD-10-CM | POA: Diagnosis not present

## 2022-07-13 DIAGNOSIS — K222 Esophageal obstruction: Secondary | ICD-10-CM

## 2022-07-13 DIAGNOSIS — I25709 Atherosclerosis of coronary artery bypass graft(s), unspecified, with unspecified angina pectoris: Secondary | ICD-10-CM | POA: Diagnosis not present

## 2022-07-13 MED ORDER — OMEPRAZOLE 40 MG PO CPDR: 40.0000 mg | DELAYED_RELEASE_CAPSULE | Freq: Every day | ORAL | 3 refills | Status: DC

## 2022-07-13 NOTE — Patient Instructions (Addendum)
_______________________________________________________  If you are age 80 or older, your body mass index should be between 23-30. Your Body mass index is 29.62 kg/m. If this is out of the aforementioned range listed, please consider follow up with your Primary Care Provider. ________________________________________________________  The [facility] providers would like to encourage you to use [facility] to communicate with providers for non-urgent requests or questions.  Due to long hold times on the telephone, sending your provider a message by [facility] may be a faster and more efficient way to get a response.  Please allow 48 business hours for a response.  Please remember that this is for non-urgent requests.  _______________________________________________________  Kelly Kennedy will need a follow up appointment in 6 months (February 2024).  We will contact you to schedule this appointment.  First do a trial off milk/lactose products if you use them.  Add fiber like benefiber or citracel once a day Increase activity Can do trial of IBGard which is over the counter for AB pain- Take 1-2 capsules once a day for maintence or twice a day during a flare  Please try low FODMAP diet- see below- start with just one column at a time.   FODMAP stands for fermentable oligo-, di-, mono-saccharides and polyols (1). These are the scientific terms used to classify groups of carbs that are notorious for triggering digestive symptoms like bloating, gas and stomach pain.     Thank you for entrusting me with your care and choosing [facility].  Vicie Mutters, PA-C

## 2022-07-17 ENCOUNTER — Other Ambulatory Visit: Payer: Self-pay | Admitting: Family Medicine

## 2022-07-21 ENCOUNTER — Telehealth: Payer: Self-pay | Admitting: Family Medicine

## 2022-07-21 NOTE — Telephone Encounter (Signed)
  Encourage patient to contact the pharmacy for refills or they can request refills through [facility]  Did the patient contact the pharmacy: Yes   LAST APPOINTMENT DATE: 05/12/22  NEXT APPOINTMENT DATE: 08/16/22  MEDICATION: nitrofurantoin (MACRODANTIN) 100 MG capsule  Is the patient out of medication? Yes  PHARMACY: [id number]- [place], [place] - [address] AT [address] Let patient know to contact pharmacy at the end of the day to make sure medication is ready.  Please notify patient to allow 48-72 hours to process

## 2022-07-21 NOTE — Telephone Encounter (Signed)
LOV - 05/12/22 NOV - 08/16/22 RF - 05/18/22; was put in as historical  Are you okay to refill this medication for patient?

## 2022-07-22 MED ORDER — NITROFURANTOIN MACROCRYSTAL 100 MG PO CAPS: 100.0000 mg | ORAL_CAPSULE | Freq: Every day | ORAL | 1 refills | Status: DC

## 2022-07-22 NOTE — Addendum Note (Signed)
Addended by: [name] on: 07/22/2022 07:07 AM   Modules accepted: Orders

## 2022-07-22 NOTE — Telephone Encounter (Signed)
Sent. Thanks.   

## 2022-07-30 ENCOUNTER — Other Ambulatory Visit: Payer: Self-pay | Admitting: General Practice

## 2022-08-15 ENCOUNTER — Other Ambulatory Visit: Payer: Self-pay | Admitting: Family Medicine

## 2022-08-16 ENCOUNTER — Ambulatory Visit: Payer: Medicare Other | Admitting: Family Medicine

## 2022-08-16 NOTE — Progress Notes (Signed)
[facility] Quality Team Note  Name: Kelly Kennedy Date of Birth: [birth year] MRN: [id number] Date: 08/16/2022  [facility] Quality Team has reviewed this patient's chart, please see recommendations below:  [facility] Quality Other; Pt has open quality gap for KED, needs Micro/Creat Urine test in 2023 to close this. Please address at next visit.

## 2022-08-17 ENCOUNTER — Encounter: Payer: Self-pay | Admitting: Family Medicine

## 2022-08-17 ENCOUNTER — Ambulatory Visit (INDEPENDENT_AMBULATORY_CARE_PROVIDER_SITE_OTHER): Payer: Medicare Other | Admitting: Family Medicine

## 2022-08-17 VITALS — BP 130/88 | HR 92 | Temp 97.8°F | Ht 65.0 in | Wt 177.0 lb

## 2022-08-17 DIAGNOSIS — R197 Diarrhea, unspecified: Secondary | ICD-10-CM

## 2022-08-17 DIAGNOSIS — Z23 Encounter for immunization: Secondary | ICD-10-CM | POA: Diagnosis not present

## 2022-08-17 DIAGNOSIS — E039 Hypothyroidism, unspecified: Secondary | ICD-10-CM

## 2022-08-17 DIAGNOSIS — R413 Other amnesia: Secondary | ICD-10-CM | POA: Diagnosis not present

## 2022-08-17 DIAGNOSIS — E538 Deficiency of other specified B group vitamins: Secondary | ICD-10-CM | POA: Diagnosis not present

## 2022-08-17 DIAGNOSIS — E119 Type 2 diabetes mellitus without complications: Secondary | ICD-10-CM | POA: Diagnosis not present

## 2022-08-17 LAB — HEMOGLOBIN A1C: Hgb A1c MFr Bld: 6.7 % — ABNORMAL HIGH (ref 4.6–6.5)

## 2022-08-17 LAB — TSH: TSH: 0.92 u[IU]/mL (ref 0.35–5.50)

## 2022-08-17 MED ORDER — CYANOCOBALAMIN 1000 MCG/ML IJ SOLN
1000.0000 ug | Freq: Once | INTRAMUSCULAR | Status: AC
  Administered 2022-08-17: 1000 ug via INTRAMUSCULAR

## 2022-08-17 NOTE — Patient Instructions (Addendum)
Go to the lab on the way out.   If you have mychart we'll likely use that to update you.    Take care.  Glad to see you. I'll check with cardiology about the follow up scan they mentioned.   B12 shot today.  We'll see about when to follow up when I see your labs.

## 2022-08-17 NOTE — Progress Notes (Signed)
She started taking [place], she started it about 1 month ago.  She has less fecal urgency with use.  No blood in stool.  D/w pt about questran use.    TSH pending and still on levothyroxine.  D/w pt about timing of med use, on empty stomach.  She understood.    Still on aricept at baseline.  She thought she was some better.  She noted trouble with conversations, ie losing track of the flow of conversation.    No DM2 meds.  D/w pt about recheck A1c.    Due for B12 dose today, done at [place].   She had her flu shot today.    She feels well o/w.  No CP.  She had seen cardiology.  I will ask cardiology about scheduling her cardiac CT.  Meds, vitals, and allergies reviewed.   ROS: Per HPI unless specifically indicated in ROS section   GEN: nad HEENT: ncat NECK: supple w/o LA CV: rrr. PULM: ctab, no inc wob ABD: soft, +bs EXT: trace BLE edema SKIN: no acute rash

## 2022-08-18 NOTE — Assessment & Plan Note (Signed)
Continue Repatha.  See notes on follow-up A1c.

## 2022-08-18 NOTE — Assessment & Plan Note (Signed)
Continue replacement, dose given at office visit today.

## 2022-08-18 NOTE — Assessment & Plan Note (Signed)
With fecal urgency improved with Questran use.  Discussed with patient about use.  Continue as is for now.

## 2022-08-18 NOTE — Assessment & Plan Note (Signed)
Continue levothyroxine.  See notes on labs.

## 2022-08-18 NOTE — Assessment & Plan Note (Signed)
See notes on TSH, continue B12 replacement.  Continue donepezil.

## 2022-08-25 ENCOUNTER — Telehealth: Payer: Self-pay | Admitting: Cardiovascular Disease

## 2022-08-25 MED ORDER — REPATHA SURECLICK 140 MG/ML ~~LOC~~ SOAJ: SUBCUTANEOUS | 11 refills | Status: DC

## 2022-08-25 NOTE — Telephone Encounter (Signed)
*  STAT* If patient is at the pharmacy, call can be transferred to refill team.   1. Which medications need to be refilled? (please list name of each medication and dose if known) REPATHA SURECLICK 937 MG/ML SOAJ  2. Which pharmacy/location (including street and city if local pharmacy) is medication to be sent to?  [place], [place]  3. Do they need a 30 day or 90 day supply? 35  Pt daughter states she is completely out

## 2022-09-20 ENCOUNTER — Ambulatory Visit (INDEPENDENT_AMBULATORY_CARE_PROVIDER_SITE_OTHER): Payer: Medicare Other

## 2022-09-20 DIAGNOSIS — E538 Deficiency of other specified B group vitamins: Secondary | ICD-10-CM | POA: Diagnosis not present

## 2022-09-20 MED ORDER — CYANOCOBALAMIN 1000 MCG/ML IJ SOLN
1000.0000 ug | Freq: Once | INTRAMUSCULAR | Status: AC
  Administered 2022-09-20: 1000 ug via INTRAMUSCULAR

## 2022-09-20 NOTE — Progress Notes (Signed)
Per orders of Dr. Damita Dunnings, injection of B12 given by Kris Mouton. Patient tolerated injection well.

## 2022-10-07 ENCOUNTER — Other Ambulatory Visit: Payer: Self-pay | Admitting: Family Medicine

## 2022-10-14 ENCOUNTER — Other Ambulatory Visit: Payer: Self-pay | Admitting: Family Medicine

## 2022-10-19 ENCOUNTER — Encounter: Payer: Self-pay | Admitting: Gastroenterology

## 2022-10-22 ENCOUNTER — Other Ambulatory Visit: Payer: Self-pay | Admitting: Family Medicine

## 2022-10-22 ENCOUNTER — Other Ambulatory Visit: Payer: Self-pay | Admitting: Cardiovascular Disease

## 2022-10-25 ENCOUNTER — Ambulatory Visit (INDEPENDENT_AMBULATORY_CARE_PROVIDER_SITE_OTHER): Payer: Medicare Other

## 2022-10-25 DIAGNOSIS — E538 Deficiency of other specified B group vitamins: Secondary | ICD-10-CM | POA: Diagnosis not present

## 2022-10-25 MED ORDER — CYANOCOBALAMIN 1000 MCG/ML IJ SOLN
1000.0000 ug | Freq: Once | INTRAMUSCULAR | Status: AC
  Administered 2022-10-25: 1000 ug via INTRAMUSCULAR

## 2022-10-25 NOTE — Progress Notes (Signed)
Per orders of Dr. Damita Dunnings, injection of B12 1000 mcg/ml given by Pilar Grammes, CMA in Right Deltoid. Patient tolerated injection well.

## 2022-11-02 ENCOUNTER — Telehealth: Payer: Self-pay | Admitting: Gastroenterology

## 2022-11-02 NOTE — Telephone Encounter (Signed)
Inbound call from patient daughter calling to get patient scheduled for colon procedure.Please advise on scheduling.

## 2022-11-17 ENCOUNTER — Ambulatory Visit: Payer: Medicare Other | Admitting: Family Medicine

## 2022-11-22 ENCOUNTER — Emergency Department (HOSPITAL_COMMUNITY): Payer: Medicare Other

## 2022-11-22 ENCOUNTER — Inpatient Hospital Stay (HOSPITAL_COMMUNITY)
Admission: EM | Admit: 2022-11-22 | Discharge: 2022-11-25 | DRG: 280 | Disposition: A | Discharge status: Home / Self Care | Payer: Medicare Other | Attending: Internal Medicine | Admitting: Internal Medicine

## 2022-11-22 ENCOUNTER — Encounter (HOSPITAL_COMMUNITY): Payer: Self-pay | Admitting: Emergency Medicine

## 2022-11-22 DIAGNOSIS — Z8639 Personal history of other endocrine, nutritional and metabolic disease: Secondary | ICD-10-CM

## 2022-11-22 DIAGNOSIS — E119 Type 2 diabetes mellitus without complications: Secondary | ICD-10-CM | POA: Diagnosis not present

## 2022-11-22 DIAGNOSIS — Z800 Family history of malignant neoplasm of digestive organs: Secondary | ICD-10-CM | POA: Diagnosis not present

## 2022-11-22 DIAGNOSIS — I2510 Atherosclerotic heart disease of native coronary artery without angina pectoris: Secondary | ICD-10-CM | POA: Diagnosis not present

## 2022-11-22 DIAGNOSIS — I10 Essential (primary) hypertension: Secondary | ICD-10-CM | POA: Diagnosis not present

## 2022-11-22 DIAGNOSIS — E785 Hyperlipidemia, unspecified: Secondary | ICD-10-CM | POA: Diagnosis present

## 2022-11-22 DIAGNOSIS — E876 Hypokalemia: Secondary | ICD-10-CM | POA: Diagnosis present

## 2022-11-22 DIAGNOSIS — Z9102 Food additives allergy status: Secondary | ICD-10-CM

## 2022-11-22 DIAGNOSIS — Z882 Allergy status to sulfonamides status: Secondary | ICD-10-CM | POA: Diagnosis not present

## 2022-11-22 DIAGNOSIS — Z8673 Personal history of transient ischemic attack (TIA), and cerebral infarction without residual deficits: Secondary | ICD-10-CM | POA: Diagnosis not present

## 2022-11-22 DIAGNOSIS — I25118 Atherosclerotic heart disease of native coronary artery with other forms of angina pectoris: Secondary | ICD-10-CM | POA: Diagnosis not present

## 2022-11-22 DIAGNOSIS — Z7982 Long term (current) use of aspirin: Secondary | ICD-10-CM

## 2022-11-22 DIAGNOSIS — I493 Ventricular premature depolarization: Secondary | ICD-10-CM | POA: Diagnosis not present

## 2022-11-22 DIAGNOSIS — Z9012 Acquired absence of left breast and nipple: Secondary | ICD-10-CM

## 2022-11-22 DIAGNOSIS — Z853 Personal history of malignant neoplasm of breast: Secondary | ICD-10-CM

## 2022-11-22 DIAGNOSIS — Z87891 Personal history of nicotine dependence: Secondary | ICD-10-CM | POA: Diagnosis not present

## 2022-11-22 DIAGNOSIS — I2699 Other pulmonary embolism without acute cor pulmonale: Secondary | ICD-10-CM | POA: Insufficient documentation

## 2022-11-22 DIAGNOSIS — I214 Non-ST elevation (NSTEMI) myocardial infarction: Secondary | ICD-10-CM | POA: Diagnosis not present

## 2022-11-22 DIAGNOSIS — K219 Gastro-esophageal reflux disease without esophagitis: Secondary | ICD-10-CM | POA: Diagnosis present

## 2022-11-22 DIAGNOSIS — H409 Unspecified glaucoma: Secondary | ICD-10-CM | POA: Diagnosis not present

## 2022-11-22 DIAGNOSIS — Z833 Family history of diabetes mellitus: Secondary | ICD-10-CM

## 2022-11-22 DIAGNOSIS — Z8261 Family history of arthritis: Secondary | ICD-10-CM

## 2022-11-22 DIAGNOSIS — I161 Hypertensive emergency: Secondary | ICD-10-CM | POA: Diagnosis present

## 2022-11-22 DIAGNOSIS — R0602 Shortness of breath: Secondary | ICD-10-CM | POA: Diagnosis not present

## 2022-11-22 DIAGNOSIS — E039 Hypothyroidism, unspecified: Secondary | ICD-10-CM | POA: Diagnosis not present

## 2022-11-22 DIAGNOSIS — I471 Supraventricular tachycardia, unspecified: Secondary | ICD-10-CM | POA: Diagnosis not present

## 2022-11-22 DIAGNOSIS — I82443 Acute embolism and thrombosis of tibial vein, bilateral: Secondary | ICD-10-CM | POA: Diagnosis not present

## 2022-11-22 DIAGNOSIS — R231 Pallor: Secondary | ICD-10-CM | POA: Diagnosis not present

## 2022-11-22 DIAGNOSIS — I82409 Acute embolism and thrombosis of unspecified deep veins of unspecified lower extremity: Secondary | ICD-10-CM | POA: Insufficient documentation

## 2022-11-22 DIAGNOSIS — R0789 Other chest pain: Secondary | ICD-10-CM | POA: Diagnosis not present

## 2022-11-22 DIAGNOSIS — Z79890 Hormone replacement therapy (postmenopausal): Secondary | ICD-10-CM

## 2022-11-22 DIAGNOSIS — Z888 Allergy status to other drugs, medicaments and biological substances status: Secondary | ICD-10-CM | POA: Diagnosis not present

## 2022-11-22 DIAGNOSIS — Z801 Family history of malignant neoplasm of trachea, bronchus and lung: Secondary | ICD-10-CM | POA: Diagnosis not present

## 2022-11-22 DIAGNOSIS — R000 Tachycardia, unspecified: Secondary | ICD-10-CM | POA: Diagnosis not present

## 2022-11-22 DIAGNOSIS — R079 Chest pain, unspecified: Secondary | ICD-10-CM | POA: Diagnosis not present

## 2022-11-22 DIAGNOSIS — Z79899 Other long term (current) drug therapy: Secondary | ICD-10-CM | POA: Diagnosis not present

## 2022-11-22 DIAGNOSIS — Z8249 Family history of ischemic heart disease and other diseases of the circulatory system: Secondary | ICD-10-CM

## 2022-11-22 LAB — TROPONIN I (HIGH SENSITIVITY)
Troponin I (High Sensitivity): 199 ng/L (ref <18)
Troponin I (High Sensitivity): 353 ng/L (ref <18)

## 2022-11-22 LAB — BASIC METABOLIC PANEL
Anion gap: 12 (ref 5–15)
BUN: 13 mg/dL (ref 8–23)
CO2: 23 mmol/L (ref 22–32)
Calcium: 9.3 mg/dL (ref 8.9–10.3)
Chloride: 106 mmol/L (ref 98–111)
Creatinine, Ser: 0.92 mg/dL (ref 0.44–1.00)
GFR, Estimated: >60 mL/min (ref >60)
Glucose, Bld: 108 mg/dL — ABNORMAL HIGH (ref 70–99)
Potassium: 3.1 mmol/L — ABNORMAL LOW (ref 3.5–5.1)
Sodium: 141 mmol/L (ref 135–145)

## 2022-11-22 LAB — CBC
HCT: 36.7 % (ref 36.0–46.0)
Hemoglobin: 12.5 g/dL (ref 12.0–15.0)
MCH: 29.8 pg (ref 26.0–34.0)
MCHC: 34.1 g/dL (ref 30.0–36.0)
MCV: 87.4 fL (ref 80.0–100.0)
Platelets: 358 10*3/uL (ref 150–400)
RBC: 4.2 MIL/uL (ref 3.87–5.11)
RDW: 14.4 % (ref 11.5–15.5)
WBC: 6.6 10*3/uL (ref 4.0–10.5)
nRBC: 0 % (ref 0.0–0.2)

## 2022-11-22 LAB — MAGNESIUM: Magnesium: 1.9 mg/dL (ref 1.7–2.4)

## 2022-11-22 MED ORDER — AMLODIPINE BESYLATE 5 MG PO TABS
5.0000 mg | ORAL_TABLET | Freq: Every day | ORAL | Status: DC
  Administered 2022-11-22 – 2022-11-25 (×4): 5 mg via ORAL
  Filled 2022-11-22 (×4): qty 1

## 2022-11-22 MED ORDER — LEVOTHYROXINE SODIUM 75 MCG PO TABS
75.0000 ug | ORAL_TABLET | Freq: Every day | ORAL | Status: DC
  Administered 2022-11-23 – 2022-11-25 (×3): 75 ug via ORAL
  Filled 2022-11-22 (×3): qty 1

## 2022-11-22 MED ORDER — SODIUM CHLORIDE 0.9% FLUSH
3.0000 mL | Freq: Two times a day (BID) | INTRAVENOUS | Status: DC
  Administered 2022-11-23 – 2022-11-24 (×4): 3 mL via INTRAVENOUS

## 2022-11-22 MED ORDER — NITROGLYCERIN IN D5W 200-5 MCG/ML-% IV SOLN
0.0000 ug/min | INTRAVENOUS | Status: DC
  Administered 2022-11-22: 5 ug/min via INTRAVENOUS
  Filled 2022-11-22: qty 250

## 2022-11-22 MED ORDER — SODIUM CHLORIDE 0.9 % WEIGHT BASED INFUSION
1.0000 mL/kg/h | INTRAVENOUS | Status: DC
  Administered 2022-11-23: 1 mL/kg/h via INTRAVENOUS

## 2022-11-22 MED ORDER — NITROGLYCERIN 0.4 MG SL SUBL: 0.4000 mg | SUBLINGUAL_TABLET | SUBLINGUAL | Status: DC | PRN

## 2022-11-22 MED ORDER — PANTOPRAZOLE SODIUM 40 MG PO TBEC
80.0000 mg | DELAYED_RELEASE_TABLET | Freq: Every day | ORAL | Status: DC
  Administered 2022-11-23 – 2022-11-25 (×4): 80 mg via ORAL
  Filled 2022-11-22 (×4): qty 2

## 2022-11-22 MED ORDER — SODIUM CHLORIDE 0.9 % IV SOLN: 250.0000 mL | INTRAVENOUS | Status: DC | PRN

## 2022-11-22 MED ORDER — SODIUM CHLORIDE 0.9% FLUSH: 3.0000 mL | INTRAVENOUS | Status: DC | PRN

## 2022-11-22 MED ORDER — NITROFURANTOIN MACROCRYSTAL 50 MG PO CAPS
100.0000 mg | ORAL_CAPSULE | Freq: Every day | ORAL | Status: DC
  Administered 2022-11-24 – 2022-11-25 (×2): 100 mg via ORAL
  Filled 2022-11-22: qty 2
  Filled 2022-11-22 (×2): qty 1
  Filled 2022-11-22: qty 2

## 2022-11-22 MED ORDER — ASPIRIN 81 MG PO CHEW: 81.0000 mg | CHEWABLE_TABLET | ORAL | Status: DC

## 2022-11-22 MED ORDER — HEPARIN BOLUS VIA INFUSION
3000.0000 [IU] | Freq: Once | INTRAVENOUS | Status: AC
  Administered 2022-11-22: 3000 [IU] via INTRAVENOUS
  Filled 2022-11-22: qty 3000

## 2022-11-22 MED ORDER — AMITRIPTYLINE HCL 25 MG PO TABS: 50.0000 mg | ORAL_TABLET | Freq: Every evening | ORAL | Status: DC | PRN

## 2022-11-22 MED ORDER — SODIUM CHLORIDE 0.9% FLUSH
3.0000 mL | Freq: Two times a day (BID) | INTRAVENOUS | Status: DC
  Administered 2022-11-23 – 2022-11-24 (×2): 3 mL via INTRAVENOUS

## 2022-11-22 MED ORDER — SODIUM CHLORIDE 0.9 % WEIGHT BASED INFUSION
3.0000 mL/kg/h | INTRAVENOUS | Status: DC
  Administered 2022-11-23: 3 mL/kg/h via INTRAVENOUS

## 2022-11-22 MED ORDER — INSULIN ASPART 100 UNIT/ML IJ SOLN: 0.0000 [IU] | Freq: Three times a day (TID) | INTRAMUSCULAR | Status: DC

## 2022-11-22 MED ORDER — ACETAMINOPHEN 325 MG PO TABS: 650.0000 mg | ORAL_TABLET | ORAL | Status: DC | PRN

## 2022-11-22 MED ORDER — CHOLESTYRAMINE 4 G PO PACK
4.0000 g | PACK | Freq: Three times a day (TID) | ORAL | Status: DC
  Administered 2022-11-23 – 2022-11-25 (×3): 4 g via ORAL
  Filled 2022-11-22 (×9): qty 1

## 2022-11-22 MED ORDER — LATANOPROST 0.005 % OP SOLN
1.0000 [drp] | Freq: Every day | OPHTHALMIC | Status: DC
  Administered 2022-11-23 – 2022-11-24 (×2): 1 [drp] via OPHTHALMIC
  Filled 2022-11-22: qty 2.5

## 2022-11-22 MED ORDER — FESOTERODINE FUMARATE ER 4 MG PO TB24
4.0000 mg | ORAL_TABLET | Freq: Every day | ORAL | Status: DC
  Administered 2022-11-23 – 2022-11-25 (×3): 4 mg via ORAL
  Filled 2022-11-22 (×3): qty 1

## 2022-11-22 MED ORDER — ONDANSETRON HCL 4 MG/2ML IJ SOLN: 4.0000 mg | Freq: Four times a day (QID) | INTRAMUSCULAR | Status: DC | PRN

## 2022-11-22 MED ORDER — POTASSIUM CHLORIDE 10 MEQ/100ML IV SOLN
10.0000 meq | INTRAVENOUS | Status: AC
  Administered 2022-11-22 (×2): 10 meq via INTRAVENOUS
  Filled 2022-11-22 (×2): qty 100

## 2022-11-22 MED ORDER — HEPARIN (PORCINE) 25000 UT/250ML-% IV SOLN
950.0000 [IU]/h | INTRAVENOUS | Status: DC
  Administered 2022-11-22: 950 [IU]/h via INTRAVENOUS
  Filled 2022-11-22: qty 250

## 2022-11-22 MED ORDER — POTASSIUM CHLORIDE CRYS ER 20 MEQ PO TBCR
40.0000 meq | EXTENDED_RELEASE_TABLET | Freq: Once | ORAL | Status: AC
  Administered 2022-11-22: 40 meq via ORAL
  Filled 2022-11-22: qty 2

## 2022-11-22 MED ORDER — LIFITEGRAST 5 % OP SOLN: 1.0000 [drp] | Freq: Two times a day (BID) | OPHTHALMIC | Status: DC

## 2022-11-22 MED ORDER — CARVEDILOL 12.5 MG PO TABS
12.5000 mg | ORAL_TABLET | Freq: Two times a day (BID) | ORAL | Status: DC
  Administered 2022-11-22 – 2022-11-25 (×6): 12.5 mg via ORAL
  Filled 2022-11-22 (×6): qty 1

## 2022-11-22 MED ORDER — DONEPEZIL HCL 5 MG PO TABS
5.0000 mg | ORAL_TABLET | Freq: Every day | ORAL | Status: DC
  Administered 2022-11-22 – 2022-11-24 (×3): 5 mg via ORAL
  Filled 2022-11-22 (×4): qty 1

## 2022-11-22 MED ORDER — ASPIRIN 81 MG PO TBEC
81.0000 mg | DELAYED_RELEASE_TABLET | Freq: Every day | ORAL | Status: DC
  Administered 2022-11-23 – 2022-11-25 (×3): 81 mg via ORAL
  Filled 2022-11-22 (×3): qty 1

## 2022-11-22 NOTE — Progress Notes (Addendum)
Admit orders written per Dr. Lysbeth Penner request. Patient reports no home med changes since last PCP visit 07/2022. Pharm to reconcile formal list (please review in AM rounds as well) but per d/w Dr. Debara Pickett, will re-order except hold Imdur. He confirmed full code per d/w patient. He has also signed out to on call fellow to check on patient this PM. Dr. Debara Pickett recommends to keep patient NPO for now while trying to get patient chest pain free on IV NTG. If diet is resumed, will need new order to keep NPO after midnight.

## 2022-11-22 NOTE — ED Triage Notes (Signed)
Pt arrives via EMS with CP/SOB 30 min ago. EMS gave 3 nitro and 324 ASA. BP went from 262/120 to 124/83, pain down to a 3. Left sided pain with SOB.

## 2022-11-22 NOTE — ED Provider Triage Note (Signed)
Emergency Medicine Provider Triage Evaluation Note  Kelly Kennedy , a 81 y.o. female  was evaluated in triage.  Pt complains of chest pain.  Patient complains of chest pain/shortness of breath which began approximate 30 minutes prior to arrival at the emergency department via EMS.  EMS noted the patient was significantly hypertensive upon arrival with a pressure of 262/120.  EMS administered 324 mg of aspirin along with 3 doses of nitroglycerin.  Pain went from a 10 out of 10 in severity down to a 3 out of 10 in severity.  Blood pressure dropped from the initial reading to 124/83.  Patient at this time states her pain has resolved.  She is not complaining of shortness of breath at the moment.  She denies abdominal pain, radiation of symptoms, vomiting but does endorse mild nausea.  Review of Systems  Positive: As above Negative: As above  Physical Exam  BP (!) 157/87 (BP Location: Right Arm)   Pulse (!) 105   Temp 98.2 F (36.8 C) (Oral)   Resp 18   Ht '5\' 5"'$  (1.651 m)   Wt 80 kg   SpO2 100%   BMI 29.35 kg/m  Gen:   Awake, no distress   Resp:  Normal effort  MSK:   Moves extremities without difficulty  Other:    Medical Decision Making  Medically screening exam initiated at 2:56 PM.  Appropriate orders placed.  Kelly Kennedy was informed that the remainder of the evaluation will be completed by another provider, this initial triage assessment does not replace that evaluation, and the importance of remaining in the ED until their evaluation is complete.     Dorothyann Peng, PA-C 11/22/22 1457

## 2022-11-22 NOTE — H&P (Signed)
ADMISSION HISTORY & PHYSICAL NOTE   Patient Name: Kelly Kennedy Date of Encounter: 11/22/2022 Cardiologist: Sanda Klein, MD Electrophysiologist: None Advanced Heart Failure: None   Chief Complaint   Chest pain and shortness of breath  Patient Profile   81 yo female patient of Dr. Sallyanne Kuster, with history of CAD, HTN, HLD, who has been having recurrent angina, now presents with chest pain and dyspnea, found to have elevated tropoinin  HPI   Kelly Kennedy is a 81 y.o. female who is being seen today for the evaluation of elevated troponin at the request of Dr. Johnney Killian. This is an 81 year old female followed by Dr. Sallyanne Kuster with a history of coronary artery disease, prior stroke, DM2, hypertension, hypothyroidism, history of breast cancer, dyslipidemia and other problems.  She was last seen in July by Coletta Memos, NP for follow-up of chest pain.  She was reportedly having a intermittent episodes of chest pain at that time relieved by nitroglycerin and rest consistent with stable angina.  Blood pressure appear to be well-controlled.  A CT coronary angiogram was recommended, however never performed.  It does appear that a CT angiogram of the aorta (as used for evaluating aortic pathology or for pulmonary embolism) was ordered but never scheduled or performed. She had a prior cardiac catheterization in 2019 which showed a 45% distal circumflex lesion but no other significant findings, this was despite CT FFR findings of mid circumflex stenosis which was significant.  No intervention was performed.  She now presents with chest pain and shortness of breath that started about 30 minutes prior to arrival.  EMS was called and she was given a nitro and 3 aspirin.  Blood pressure was significantly elevated reportedly at 262/120 initially however came down to normal.  Initial troponin come back as elevated at 199.  A second troponin remains pending.  She has been started on IV heparin.   Other pertinent lab findings include hypokalemia with a potassium of 3.1, but otherwise no significant findings.  Chest x-ray shows no acute abnormalities and aortic atherosclerosis.  EKG personally reviewed shows sinus tachycardia with inferior and lateral T wave inversions.  Cardiology is asked to evaluate in consultation.  PMHx   Past Medical History:  Diagnosis Date   adenomatous Colon polyps    colonoscopy 8/99, 12/02, 8/06   Arthritis    Bell's palsy    Cancer (HCC)    DCIS left breast   Cataract    Chest pain    uses NTG as needed   Depression    Diabetes mellitus without complication ([place])    Diverticulosis of colon 06/2005   Ductal carcinoma in situ (DCIS) of left breast    Family history of adverse reaction to anesthesia    " my mother didn't wake up so they had to put her on life support for about an hour or more; my sisiter has problems waking up too from anesthesia.   GERD (gastroesophageal reflux disease)    Glaucoma    H/O hiatal hernia    Headache(784.0)    Hyperglycemia    Hyperlipidemia    Hypertension    Hypothyroid    Insomnia    Irritable bowel    Menopausal symptoms    Pericarditis 1980's   Stroke [facility])    TIA (transient ischemic attack)    09/2014    Past Surgical History:  Procedure Laterality Date   APPENDECTOMY     BREAST EXCISIONAL BIOPSY Right    benign  BREAST RECONSTRUCTION WITH PLACEMENT OF TISSUE EXPANDER AND FLEX HD (ACELLULAR HYDRATED DERMIS) Left 08/06/2015   Procedure: LEFT BREAST RECONSTRUCTION WITH PLACEMENT OF SALINE IMPLANT AND ACELLULAR DURMAL MATRIX;  Surgeon: Crissie Reese, MD;  Location: [place];  Service: Plastics;  Laterality: Left;   CARDIAC CATHETERIZATION     CATARACT EXTRACTION Bilateral 2016   CHOLECYSTECTOMY  1990   COLONOSCOPY     CYSTECTOMY  05/04/2008   left lower arm, benign   ECTOPIC PREGNANCY SURGERY  1971   FRACTURE SURGERY     nose   LEFT HEART CATH AND CORONARY ANGIOGRAPHY [name] 09/21/2018   Procedure: LEFT  HEART CATH AND CORONARY ANGIOGRAPHY;  Surgeon: Leonie Man, MD;  Location: [place];  Service: Cardiovascular;  Laterality: [name];   MASTECTOMY Left 2016   NIPPLE SPARING MASTECTOMY Left 08/06/2015   Procedure: LEFT NIPPLE SPARING MASTECTOMY;  Surgeon: [name], MD;  Location: [place];  Service: General;  Laterality: Left;   ORIF ANKLE FRACTURE Left 10/23/2013   Procedure: OPEN REDUCTION INTERNAL FIXATION (ORIF) LEFT ANKLE FRACTURE;  Surgeon: Marianna Payment, MD;  Location: [place];  Service: Orthopedics;  Laterality: Left;   PARTIAL HYSTERECTOMY     ovaries intact   PARTIAL MASTECTOMY WITH NEEDLE LOCALIZATION AND AXILLARY SENTINEL LYMPH NODE BX Left 05/06/2015   Procedure: LEFT BREAST PARTIAL MASTECTOMY WITH NEEDLE LOCALIZATION AND SENTINEL LYMPH NODE MAPPING;  Surgeon: [name], MD;  Location: [place];  Service: General;  Laterality: Left;   TONSILLECTOMY     TUBAL LIGATION      FAMHx   Family History  Problem Relation Age of Onset   Cancer Father        stomach    Stomach cancer Father    Colon cancer Father    Heart disease Son        MI   Arthritis Mother    Diabetes Mother    Cancer Brother        lung cancer   Pancreatic cancer Neg Hx    Breast cancer Neg Hx    Esophageal cancer Neg Hx    Rectal cancer Neg Hx     SOCHx    reports that she quit smoking about 5 years ago. Her smoking use included cigarettes. She has quit using smokeless tobacco. She reports that she does not drink alcohol and does not use drugs.  Outpatient Medications   No current facility-administered medications on file prior to encounter.   Current Outpatient Medications on File Prior to Encounter  Medication Sig Dispense Refill   amitriptyline (ELAVIL) 50 MG tablet TAKE 1-2 TABLETS (50-100 MG TOTAL) BY MOUTH AT BEDTIME AS NEEDED. FOR SLEEP 180 tablet 1   amLODipine (NORVASC) 5 MG tablet Take 5 mg by mouth daily.     aspirin EC 81 MG tablet Take 1 tablet  (81 mg total) by mouth daily. Swallow whole. 90 tablet 3   carvedilol (COREG) 12.5 MG tablet TAKE 1 TABLET BY MOUTH TWICE A DAY 180 tablet 1   cholestyramine (QUESTRAN) 4 g packet TAKE 1 PACKET (4 G TOTAL) BY MOUTH 3 (THREE) TIMES DAILY WITH MEALS. 60 packet 2   cyanocobalamin (,VITAMIN B-12,) 1000 MCG/ML injection Inject 1 mL (1,000 mcg total) into the muscle every 30 (thirty) days.     diclofenac Sodium (VOLTAREN) 1 % GEL APPLY 2 G TOPICALLY 4 (FOUR) TIMES DAILY AS NEEDED. 100 g 12   donepezil (ARICEPT ODT) 5 MG disintegrating tablet TAKE 1 TABLET BY  MOUTH EVERYDAY AT BEDTIME 90 tablet 1   Evolocumab (REPATHA SURECLICK) 951 MG/ML SOAJ ADMINISTER 1 ML UNDER THE SKIN EVERY 14 DAYS 2 mL 11   fluocinonide cream (LIDEX) 0.05 % APPLY TOPICALLY DAILY AS NEEDED. USE SPARINGLY. 30 g 1   isosorbide mononitrate (IMDUR) 30 MG 24 hr tablet TAKE 1 TABLET BY MOUTH EVERY DAY 90 tablet 3   levothyroxine (SYNTHROID) 75 MCG tablet TAKE 1 TABLET BY MOUTH EVERY DAY BEFORE BREAKFAST 90 tablet 1   Lifitegrast (XIIDRA) 5 % SOLN Apply 1 drop to eye in the morning and at bedtime. Twice eyes     LUMIGAN 0.01 % SOLN Place 1 drop into both eyes at bedtime.     nitrofurantoin (MACRODANTIN) 100 MG capsule TAKE 1 CAPSULE BY MOUTH EVERY DAY 90 capsule 1   nitroGLYCERIN (NITROSTAT) 0.4 MG SL tablet PLACE 1 TABLET UNDER THE TONGUE EVERY 5 MINUTES AS NEEDED FOR CHEST PAIN. 25 tablet 2   omeprazole (PRILOSEC) 40 MG capsule Take 1 capsule (40 mg total) by mouth daily. 90 capsule 3   solifenacin (VESICARE) 5 MG tablet TAKE 1 TABLET (5 MG TOTAL) BY MOUTH DAILY. 90 tablet 3    Inpatient Medications    Scheduled Meds:  potassium chloride  40 mEq Oral Once    Continuous Infusions:  heparin 950 Units/hr (11/22/22 1908)   nitroGLYCERIN     potassium chloride      PRN Meds:    ALLERGIES   Allergies  Allergen Reactions   Ace Inhibitors Swelling    H/o lip swelling with change in losartan pill   Angiotensin Receptor  Blockers Swelling    H/o lip swelling with change in losartan pill   Statins Anaphylaxis    myalgias   Flonase [Fluticasone Propionate] Other (See Comments)    Intolerant of smell   Glipizide Other (See Comments)    unstready when on med.     Iron Other (See Comments)    Bloating with oral iron   Sulfa Antibiotics Other (See Comments)    Intolerant of sulfa but able to tolerate trimethoprim   Niacin Rash   Red Dye Rash    ROS   Pertinent items noted in HPI and remainder of comprehensive ROS otherwise negative.  Vitals   Vitals:   11/22/22 1452 11/22/22 1456 11/22/22 1746 11/22/22 1919  BP: (!) 157/87  (!) 184/96 (!) 195/126  Pulse: (!) 105  (!) 110 (!) 114  Resp: '18  14 17  '$ Temp: 98.2 F (36.8 C)  98.7 F (37.1 C)   TempSrc: Oral     SpO2: 100%  99% 100%  Weight:  80 kg    Height:  '5\' 5"'$  (1.651 m)     No intake or output data in the 24 hours ending 11/22/22 1924 Filed Weights   11/22/22 1456  Weight: 80 kg    Physical Exam   General appearance: alert, appears stated age, and no distress Neck: no carotid bruit, no JVD, and thyroid not enlarged, symmetric, no tenderness/mass/nodules Lungs: clear to auscultation bilaterally Heart: regular tachycardia, 2/6 SEM at RUSB Abdomen: soft, non-tender; bowel sounds normal; no masses,  no organomegaly Extremities: bilateral lymphedema Pulses: 2+ and symmetric Skin: Skin color, texture, turgor normal. No rashes or lesions Neurologic: Grossly normal Psych: Anxious, in pain  Labs   Results for orders placed or performed during the hospital encounter of 11/22/22 (from the past 48 hour(s))  Basic metabolic panel     Status: Abnormal   Collection Time: 11/22/22  3:12 PM  Result Value Ref Range   Sodium 141 135 - 145 mmol/L   Potassium 3.1 (L) 3.5 - 5.1 mmol/L   Chloride 106 98 - 111 mmol/L   CO2 23 22 - 32 mmol/L   Glucose, Bld 108 (H) 70 - 99 mg/dL    Comment: Glucose reference range applies only to samples taken after  fasting for at least 8 hours.   BUN 13 8 - 23 mg/dL   Creatinine, Ser 0.92 0.44 - 1.00 mg/dL   Calcium 9.3 8.9 - 10.3 mg/dL   GFR, Estimated >60 >60 mL/min    Comment: (NOTE) Calculated using the CKD-EPI Creatinine Equation (2021)    Anion gap 12 5 - 15    Comment: Performed at [facility] [address], [place] [address]  CBC     Status: None   Collection Time: 11/22/22  3:12 PM  Result Value Ref Range   WBC 6.6 4.0 - 10.5 K/uL   RBC 4.20 3.87 - 5.11 MIL/uL   Hemoglobin 12.5 12.0 - 15.0 g/dL   HCT 36.7 36.0 - 46.0 %   MCV 87.4 80.0 - 100.0 fL   MCH 29.8 26.0 - 34.0 pg   MCHC 34.1 30.0 - 36.0 g/dL   RDW 14.4 11.5 - 15.5 %   Platelets 358 150 - 400 K/uL   nRBC 0.0 0.0 - 0.2 %    Comment: Performed at [facility], [place] [address] [place], [place] [address]  Troponin I (High Sensitivity)     Status: Abnormal   Collection Time: 11/22/22  3:12 PM  Result Value Ref Range   Troponin I (High Sensitivity) 199 (HH) <18 ng/L    Comment: CRITICAL RESULT CALLED TO, READ BACK BY AND VERIFIED WITH M.BARBER,RN '@1654'$  11/22/2022 VANG.J (NOTE) Elevated high sensitivity troponin I (hsTnI) values and significant  changes across serial measurements may suggest ACS but many other  chronic and acute conditions are known to elevate hsTnI results.  Refer to the "Links" section for chest pain algorithms and additional  guidance. Performed at [facility], [place] [address], [place], [place] [address]     ECG   Sinus tachycardia with inferior and lateral T wave inversions- Personally Reviewed  Telemetry   Sinus rhythm- Personally Reviewed  Radiology   DG Chest 2 View  Result Date: 11/22/2022 CLINICAL DATA:  Chest pain and shortness of breath for 30 minutes EXAM: CHEST - 2 VIEW COMPARISON:  08/07/2019 FINDINGS: Normal heart size, mediastinal contours, and pulmonary vascularity. Atherosclerotic calcification aorta. Lungs clear. No acute infiltrate, pleural effusion,  or pneumothorax. Osseous demineralization. IMPRESSION: No acute abnormalities. Aortic Atherosclerosis (ICD10-I70.0). Electronically Signed   By: Lavonia Dana M.D.   On: 11/22/2022 15:57    Cardiac Studies     Cober[place], Kelly Kennedy  Procedures: CARDIAC CATHETERIZATION  Height: '5\' 3"'$  (1.6 m)  Weight: 78.5 kg  Blood Pressure: 130/71  Heart Rate: 99   Accession Number: [id number]  Date of Study: 09/21/18  Ordering Provider: Leonie Man, MD  Clinical Indications: Atypical angina [I20.8 (ICD-10-CM)], Abnormal cardiac CT angiography [R93.1 (ICD-10-CM)]     MyChart Results Release  MyChart Status: Inactive    Physicians  Panel Physicians Referring Physician Case Authorizing Physician  Leonie Man, MD (Primary)     Procedures  LEFT HEART CATH AND CORONARY ANGIOGRAPHY   Conclusion    Dist Cx lesion is 45% stenosed -angiographically moderate at best disease. Does not explain CT scan. There  is hyperdynamic left ventricular systolic function. The left ventricular ejection fraction is greater than 65% by visual estimate. LV end diastolic pressure is normal.   Mild to moderate disease distal dominant Circumflex.  There is no culprit lesion to explain CT scan findings. Otherwise essentially normal coronary arteries and a left dominant system. Normal LVEF with hyperdynamic function.  The patient had a brief run of PAT/SVT during injection.   Patient will return to short stay holding area for ongoing care. Would recommend beta-blocker.  The patient felt palpitations with PVCs and brief runs of PAT with left ventricular hemodynamics and LV gram.  She describes her chest discomfort as the symptoms noted with documented ectopy and brief PAT runs.   She will be ready for discharge home after bedrest.   Recommend Aspirin '81mg'$  daily for moderate CAD.      Glenetta Hew, MD   [place]  Resting                Left Heart  Left Ventricle The left ventricular size is  normal. There is hyperdynamic left ventricular systolic function. LV end diastolic pressure is normal. The left ventricular ejection fraction is greater than 65% by visual estimate.  Mitral Valve There is no mitral valve stenosis and no mitral valve prolapse evident. There is normal mitral valve motion. The annulus is normal.  Aortic Valve There is no aortic valve stenosis. There is normal aortic valve motion.   Coronary Diagrams  Diagnostic Dominance: Left   Impression   Principal Problem:   NSTEMI (non-ST elevated myocardial infarction) [facility]) Active Problems:   Diabetes mellitus without complication ([place])   ? History of TIA (transient ischemic attack)   HLD (hyperlipidemia)   Coronary artery disease   Recommendation   NSTEMI Ms. Hemmer has had chest pain starting last night which has worsened in intensity associated with significant shortness of breath.  She was markedly hypertensive on admission and responded to nitro with rhythm resolution of chest pain, however she reports is now back at an 8 out of 10 intensity.  She is on IV heparin.  Would start IV nitroglycerin.  Continue aspirin 81 mg daily.  Standard ACS treatment with plans for cardiac catheterization tomorrow.  Please keep n.p.o. after midnight. HTN Presented with hypertensive emergency with systolic blood pressure over 200, improved with sublingual nitro now increasing at 195/126.  Plan to start nitroglycerin drip and titrate to chest pain improvement and improved blood pressure.  Restart home blood pressure meds except for Imdur. HLD On Repatha due to statin intolerance. DM2 Would use sliding scale insulin as it was noted there was an absence of diabetes medications, though she carries this diagnosis.  Check hemoglobin A1c. Hypothyroidism Continue Synthroid.  Check TSH and free T4.  Full Code  Time Spent Directly with Patient:  I have spent a total of 45 minutes with the patient reviewing hospital notes,  telemetry, EKGs, labs and examining the patient as well as establishing an assessment and plan that was discussed personally with the patient.  > 50% of time was spent in direct patient care.  Length of Stay:  LOS: 0 days   Pixie Casino, MD, [facility], [place] Director of the Advanced Lipid Disorders &  Cardiovascular Risk Reduction Clinic Diplomate of the American Board of Clinical Lipidology Attending Cardiologist  Direct Dial: [phone]  Fax: [phone]  Website:  [email or web address] [email or web address] Lavita Pontius 11/22/2022, 7:24 PM

## 2022-11-22 NOTE — ED Provider Notes (Addendum)
I provided a substantive portion of the care of this patient.  I personally performed the entirety of the exam for this encounter.     Consult: Dr. Debara Pickett has evaluated the patient.  At this time she has had recurrence of chest pain 8 out of 10.  Blood pressures are elevated 180s over 90s.  Patient is currently getting heparin infused.  Will also initiate nitroglycerin drip for chest pain and hypertension.  Plan is for admission to cardiology service.  19: 26 patient is alert and clear mental status.  No respiratory distress at rest.  Heart regular borderline tachycardia.  Lungs grossly clear.  Patient has about 2+ pitting edema bilateral lower extremities.  Calves are nontender.  Patient reports that she did get complete relief of chest pain previously with nitroglycerin.  Chest pain then recrudesced with recrudescence of hypertension.  First troponin elevated at 199.  At this time consistent with ACS versus hypertensive emergency with demand ischemia.  Will proceed with addition of nitroglycerin drip.  Patient also has hypokalemia at 3.1.  Will add 2 runs of potassium 10 mEq and an oral dose of 40 mEq.   Charlesetta Shanks, MD 11/22/22 Kathyrn Drown    Charlesetta Shanks, MD 11/22/22 1929

## 2022-11-22 NOTE — ED Provider Notes (Signed)
[facility] EMERGENCY DEPARTMENT Provider Note   CSN: [id number] Arrival date & time: 11/22/22  1421     History  Chief Complaint  Patient presents with   Chest Pain    Kelly Kennedy is a 81 y.o. female.  The history is provided by the patient, medical records and a relative. No language interpreter was used.  Chest Pain    81 year old female significant history of diabetes, hypertension, prior stroke, hyperlipidemia, GERD who was brought here via EMS for complaints of chest pain and shortness of breath.  Patient reports since 9 AM this morning she has had pain in her chest.  Pain is in her mid chest, nonradiating with associated lightheadedness and dizziness, felt nauseous, and shortness of breath but not diaphoresis.  EMS was called and patient was given 3 sublingual nitro and 324 mg of aspirin with some improvement of her pain.  EMS noted that her initial blood pressure was 262/120 which subsequently improved to 124/83 after initial treatment.  At this time patient states her pain has returned and is moderate in intensity.  She has never had pain like this in the past.  She does not endorse any fever or chills no productive cough no hemoptysis.  She denies any pleuritic chest pain.  She does not complain of back pain or abdominal pain and no arm pain or jaw pain.  She denies alcohol or tobacco use.  States she is normally compliant with her medication but did not take her blood pressure medication this morning.  No prior history of PE or DVT.  Home Medications Prior to Admission medications   Medication Sig Start Date End Date Taking? Authorizing Provider  amitriptyline (ELAVIL) 50 MG tablet TAKE 1-2 TABLETS (50-100 MG TOTAL) BY MOUTH AT BEDTIME AS NEEDED. FOR SLEEP 10/24/22   [name], MD  amLODipine (NORVASC) 5 MG tablet Take 5 mg by mouth daily. 04/24/21   [provider]  aspirin EC 81 MG tablet Take 1 tablet (81 mg total) by mouth daily.  Swallow whole. 02/12/21   Croitoru, Mihai, MD  carvedilol (COREG) 12.5 MG tablet TAKE 1 TABLET BY MOUTH TWICE A DAY 10/25/22   Croitoru, Mihai, MD  cholestyramine (QUESTRAN) 4 g packet TAKE 1 PACKET (4 G TOTAL) BY MOUTH 3 (THREE) TIMES DAILY WITH MEALS. 10/24/22   [name], MD  cyanocobalamin (,VITAMIN B-12,) 1000 MCG/ML injection Inject 1 mL (1,000 mcg total) into the muscle every 30 (thirty) days. 07/07/21   [name], MD  diclofenac Sodium (VOLTAREN) 1 % GEL APPLY 2 G TOPICALLY 4 (FOUR) TIMES DAILY AS NEEDED. 10/17/22   [name], MD  donepezil (ARICEPT ODT) 5 MG disintegrating tablet TAKE 1 TABLET BY MOUTH EVERYDAY AT BEDTIME 10/10/22   [name], MD  Evolocumab (REPATHA SURECLICK) 937 MG/ML SOAJ ADMINISTER 1 ML UNDER THE SKIN EVERY 14 DAYS 08/25/22   Croitoru, Mihai, MD  fluocinonide cream (LIDEX) 0.05 % APPLY TOPICALLY DAILY AS NEEDED. USE SPARINGLY. 10/17/22   [name], MD  isosorbide mononitrate (IMDUR) 30 MG 24 hr tablet TAKE 1 TABLET BY MOUTH EVERY DAY 10/25/22   Croitoru, Mihai, MD  levothyroxine (SYNTHROID) 75 MCG tablet TAKE 1 TABLET BY MOUTH EVERY DAY BEFORE BREAKFAST 10/24/22   [name], MD  Lifitegrast Shirley Friar) 5 % SOLN Apply 1 drop to eye in the morning and at bedtime. Twice eyes    [provider]  LUMIGAN 0.01 % SOLN Place 1 drop into  both eyes at bedtime. 06/29/19   [provider]  nitrofurantoin (MACRODANTIN) 100 MG capsule TAKE 1 CAPSULE BY MOUTH EVERY DAY 10/24/22   [name], MD  nitroGLYCERIN (NITROSTAT) 0.4 MG SL tablet PLACE 1 TABLET UNDER THE TONGUE EVERY 5 MINUTES AS NEEDED FOR CHEST PAIN. 08/03/22   Croitoru, Dani Gobble, MD  omeprazole (PRILOSEC) 40 MG capsule Take 1 capsule (40 mg total) by mouth daily. 07/13/22   Vladimir Crofts, PA-C  solifenacin (VESICARE) 5 MG tablet TAKE 1 TABLET (5 MG TOTAL) BY MOUTH DAILY. 10/24/22   [name], MD      Allergies    Ace inhibitors, Angiotensin receptor blockers,  Statins, Flonase [fluticasone propionate], Glipizide, Iron, Sulfa antibiotics, Niacin, and Red dye    Review of Systems   Review of Systems  Cardiovascular:  Positive for chest pain.  All other systems reviewed and are negative.   Physical Exam Updated Vital Signs BP (!) 184/96   Pulse (!) 110   Temp 98.7 F (37.1 C)   Resp 14   Ht '5\' 5"'$  (1.651 m)   Wt 80 kg   SpO2 99%   BMI 29.35 kg/m  Physical Exam Vitals and nursing note reviewed.  Constitutional:      General: She is not in acute distress.    Appearance: She is well-developed.  HENT:     Head: Atraumatic.  Eyes:     Conjunctiva/sclera: Conjunctivae normal.  Cardiovascular:     Rate and Rhythm: Tachycardia present.     Pulses: Normal pulses.     Heart sounds: Normal heart sounds.  Pulmonary:     Effort: Pulmonary effort is normal.     Breath sounds: No wheezing, rhonchi or rales.  Abdominal:     Palpations: Abdomen is soft.     Tenderness: There is no abdominal tenderness.  Musculoskeletal:     Cervical back: Neck supple.     Right lower leg: Edema present.     Left lower leg: Edema present.  Skin:    Capillary Refill: Capillary refill takes less than 2 seconds.     Findings: No rash.  Neurological:     Mental Status: She is alert.  Psychiatric:        Mood and Affect: Mood normal.     ED Results / Procedures / Treatments   Labs (all labs ordered are listed, but only abnormal results are displayed) Labs Reviewed  BASIC METABOLIC PANEL - Abnormal; Notable for the following components:      Result Value   Potassium 3.1 (*)    Glucose, Bld 108 (*)    All other components within normal limits  TROPONIN I (HIGH SENSITIVITY) - Abnormal; Notable for the following components:   Troponin I (High Sensitivity) 199 (*)    All other components within normal limits  CBC  HEPARIN LEVEL (UNFRACTIONATED)  CBC  HEPARIN LEVEL (UNFRACTIONATED)  MAGNESIUM  TROPONIN I (HIGH SENSITIVITY)    EKG None ED ECG  REPORT   Date: 11/22/2022  Rate: 118  Rhythm: sinus tachycardia  QRS Axis: normal  Intervals: normal  ST/T Wave abnormalities: ST depressions inferiorly and ST depressions laterally  Conduction Disutrbances:none  Narrative Interpretation:   Old EKG Reviewed: changes noted  I have personally reviewed the EKG tracing and agree with the computerized printout as noted.   Radiology DG Chest 2 View  Result Date: 11/22/2022 CLINICAL DATA:  Chest pain and shortness of breath for 30 minutes EXAM: CHEST - 2 VIEW COMPARISON:  08/07/2019 FINDINGS: Normal heart size, mediastinal contours, and pulmonary vascularity. Atherosclerotic calcification aorta. Lungs clear. No acute infiltrate, pleural effusion, or pneumothorax. Osseous demineralization. IMPRESSION: No acute abnormalities. Aortic Atherosclerosis (ICD10-I70.0). Electronically Signed   By: Lavonia Dana M.D.   On: 11/22/2022 15:57    Procedures .Critical Care  Performed by: Domenic Moras, PA-C Authorized by: Domenic Moras, PA-C   Critical care provider statement:    Critical care time (minutes):  30   Critical care was time spent personally by me on the following activities:  Development of treatment plan with patient or surrogate, discussions with consultants, evaluation of patient's response to treatment, examination of patient, ordering and review of laboratory studies, ordering and review of radiographic studies, ordering and performing treatments and interventions, pulse oximetry, re-evaluation of patient's condition and review of old charts     Medications Ordered in ED Medications  heparin ADULT infusion 100 units/mL (25000 units/275m) (950 Units/hr Intravenous New Bag/Given 11/22/22 1908)  potassium chloride 10 mEq in 100 mL IVPB (10 mEq Intravenous New Bag/Given 11/22/22 1933)  nitroGLYCERIN 50 mg in dextrose 5 % 250 mL (0.2 mg/mL) infusion (5 mcg/min Intravenous New Bag/Given 11/22/22 1938)  heparin bolus via infusion 3,000 Units  (3,000 Units Intravenous Bolus from Bag 11/22/22 1908)  potassium chloride SA (KLOR-CON M) CR tablet 40 mEq (40 mEq Oral Given 11/22/22 1933)    ED Course/ Medical Decision Making/ A&P                           Medical Decision Making Amount and/or Complexity of Data Reviewed Labs: ordered. ECG/medicine tests: ordered.  Risk Prescription drug management. Decision regarding hospitalization.   BP (!) 184/96   Pulse (!) 110   Temp 98.7 F (37.1 C)   Resp 14   Ht '5\' 5"'$  (1.651 m)   Wt 80 kg   SpO2 99%   BMI 29.35 kg/m   [id number]  81year old female significant history of diabetes, hypertension, prior stroke, hyperlipidemia, GERD who was brought here via EMS for complaints of chest pain and shortness of breath.  Patient reports since 9 AM this morning she has had pain in her chest.  Pain is in her mid chest, nonradiating with associated lightheadedness and dizziness, felt nauseous, and shortness of breath but not diaphoresis.  EMS was called and patient was given 3 sublingual nitro and 324 mg of aspirin with some improvement of her pain.  EMS noted that her initial blood pressure was 262/120 which subsequently improved to 124/83 after initial treatment.  At this time patient states her pain has returned and is moderate in intensity.  She has never had pain like this in the past.  She does not endorse any fever or chills no productive cough no hemoptysis.  She denies any pleuritic chest pain.  She does not complain of back pain or abdominal pain and no arm pain or jaw pain.  She denies alcohol or tobacco use.  States she is normally compliant with her medication but did not take her blood pressure medication this morning.  No prior history of PE or DVT.  On exam this is an elderly female laying in bed in no acute discomfort.  Heart rate is elevated at 110.  Lungs are clear to auscultation.  Abdomen soft nontender.  No reproducible chest wall tenderness.  Patient does have 2+ bilateral  peripheral edema to her lower extremities.  Vital signs reviewed and remarkable for elevated blood pressure  of 184/96.  Heart rate is 110.  No hypoxia as her O2 sats is 99% on room air.  She does not have a fever.  Labs remarkable for an elevated troponin of 199.  EKG showing ST depression in the inferior lateral leads which is new.  Finding concerning for NSTEMI versus demand ischemia secondary to elevated blood pressure.  I have ordered heparin, and will consult cardiology.  Care discussed with Dr. Vallery Ridge.  EMR review, in October 2018 patient has a coronary CT angiogram does show high-grade stenosis in the mid left circumflex coronary artery disease and a calcium score of 274.  Cardiac catheterization did confirm the presence of a single discrete severe stenosis in the left circumflex coronary artery  6:58 PM Appreciate consultation from on-call cardiologist Dr. Debara Pickett who will see and evaluate patient and offer recommendation.  7:32 PM Cardiology Dr. Einar Pheasant has seen evaluate patient and will admit patient for further evaluation and management of her condition.  Blood pressure has increased and patient endorsed worsening pain therefore patient was started on a nitro drip along with heparin.  -Labs ordered, independently viewed and interpreted by me.  Labs remarkable for K+ 3.1, supplementation given. Trop 199, heparin and nitro drip started.  -The patient was maintained on a cardiac monitor.  I personally viewed and interpreted the cardiac monitored which showed an underlying rhythm of: sinus tachycardia -Imaging independently viewed and interpreted by me and I agree with radiologist's interpretation.  Result remarkable for CXR showing no acute finding -This patient presents to the ED for concern of chest pain, this involves an extensive number of treatment options, and is a complaint that carries with it a high risk of complications and morbidity.  The differential diagnosis includes STEMI, NSTEMI,  Unstable angina, demand ischemia, PE, PTX, PNA, GERD, gastritis -Co morbidities that complicate the patient evaluation includes HTN, HLD, DM, CKD -Treatment includes heparin, nitro drip -Reevaluation of the patient after these medicines showed that the patient improved -PCP office notes or outside notes reviewed -Discussion with specialist cardiologist Dr. Debara Pickett -Escalation to admission/observation considered: patient agrees with admission        Final Clinical Impression(s) / ED Diagnoses Final diagnoses:  NSTEMI (non-ST elevated myocardial infarction) [facility])    Rx / [place] Orders ED Discharge Orders     None         Domenic Moras, PA-C 11/22/22 1950    Charlesetta Shanks, MD 12/07/22 [phone]

## 2022-11-22 NOTE — Progress Notes (Signed)
ANTICOAGULATION CONSULT NOTE - Initial Consult  Pharmacy Consult for IV heparin Indication: chest pain/ACS  Allergies  Allergen Reactions   Ace Inhibitors Swelling    H/o lip swelling with change in losartan pill   Angiotensin Receptor Blockers Swelling    H/o lip swelling with change in losartan pill   Statins Anaphylaxis    myalgias   Flonase [Fluticasone Propionate] Other (See Comments)    Intolerant of smell   Glipizide Other (See Comments)    unstready when on med.     Iron Other (See Comments)    Bloating with oral iron   Sulfa Antibiotics Other (See Comments)    Intolerant of sulfa but able to tolerate trimethoprim   Niacin Rash   Red Dye Rash    Patient Measurements: Height: '5\' 5"'$  (165.1 cm) Weight: 80 kg (176 lb 5.9 oz) IBW/kg (Calculated) : 57 Heparin Dosing Weight: 80 kg  Vital Signs: Temp: 98.7 F (37.1 C) (12/26 1746) Temp Source: Oral (12/26 1452) BP: 184/96 (12/26 1746) Pulse Rate: 110 (12/26 1746)  Labs: Recent Labs    11/22/22 1512  HGB 12.5  HCT 36.7  PLT 358  CREATININE 0.92  TROPONINIHS 199*    Estimated Creatinine Clearance: 51 mL/min (by C-G formula based on SCr of 0.92 mg/dL).   Medical History: Past Medical History:  Diagnosis Date   adenomatous Colon polyps    colonoscopy 8/99, 12/02, 8/06   Arthritis    Bell's palsy    Cancer (HCC)    DCIS left breast   Cataract    Chest pain    uses NTG as needed   Depression    Diabetes mellitus without complication ([place])    Diverticulosis of colon 06/2005   Ductal carcinoma in situ (DCIS) of left breast    Family history of adverse reaction to anesthesia    " my mother didn't wake up so they had to put her on life support for about an hour or more; my sisiter has problems waking up too from anesthesia.   GERD (gastroesophageal reflux disease)    Glaucoma    H/O hiatal hernia    Headache(784.0)    Hyperglycemia    Hyperlipidemia    Hypertension    Hypothyroid    Insomnia     Irritable bowel    Menopausal symptoms    Pericarditis 1980's   Stroke [facility])    TIA (transient ischemic attack)    09/2014   Assessment: 81 yo F presents with chest pain and shortness of breath. No anticoagulations prior to admission. Pharmacy consulted to start IV heparin for ACS.  Goal of Therapy:  Heparin level 0.3-0.7 units/ml Monitor platelets by anticoagulation protocol: Yes   Plan:  Heparin IV 3000 units x 1 followed by heparin 950 untis/hr  8h heparin level F/u daily heparin level, CBC, and monitor for bleeding  Levonne Spiller 11/22/2022,6:21 PM

## 2022-11-23 ENCOUNTER — Encounter (HOSPITAL_COMMUNITY)
Admission: EM | Disposition: A | Discharge status: Home / Self Care | Payer: Self-pay | Source: Home / Self Care | Attending: Internal Medicine

## 2022-11-23 ENCOUNTER — Other Ambulatory Visit: Payer: Self-pay

## 2022-11-23 ENCOUNTER — Inpatient Hospital Stay (HOSPITAL_COMMUNITY): Payer: Medicare Other

## 2022-11-23 DIAGNOSIS — I214 Non-ST elevation (NSTEMI) myocardial infarction: Secondary | ICD-10-CM | POA: Diagnosis not present

## 2022-11-23 DIAGNOSIS — I2510 Atherosclerotic heart disease of native coronary artery without angina pectoris: Secondary | ICD-10-CM | POA: Diagnosis not present

## 2022-11-23 HISTORY — PX: LEFT HEART CATH AND CORONARY ANGIOGRAPHY: CATH118249

## 2022-11-23 LAB — BASIC METABOLIC PANEL
Anion gap: 7 (ref 5–15)
BUN: 10 mg/dL (ref 8–23)
CO2: 22 mmol/L (ref 22–32)
Calcium: 8.9 mg/dL (ref 8.9–10.3)
Chloride: 112 mmol/L — ABNORMAL HIGH (ref 98–111)
Creatinine, Ser: 0.95 mg/dL (ref 0.44–1.00)
GFR, Estimated: >60 mL/min (ref >60)
Glucose, Bld: 120 mg/dL — ABNORMAL HIGH (ref 70–99)
Potassium: 3.7 mmol/L (ref 3.5–5.1)
Sodium: 141 mmol/L (ref 135–145)

## 2022-11-23 LAB — GLUCOSE, CAPILLARY
Glucose-Capillary: 68 mg/dL — ABNORMAL LOW (ref 70–99)
Glucose-Capillary: 73 mg/dL (ref 70–99)
Glucose-Capillary: 93 mg/dL (ref 70–99)

## 2022-11-23 LAB — CBC
HCT: 34.3 % — ABNORMAL LOW (ref 36.0–46.0)
Hemoglobin: 11.8 g/dL — ABNORMAL LOW (ref 12.0–15.0)
MCH: 30.5 pg (ref 26.0–34.0)
MCHC: 34.4 g/dL (ref 30.0–36.0)
MCV: 88.6 fL (ref 80.0–100.0)
Platelets: 323 10*3/uL (ref 150–400)
RBC: 3.87 MIL/uL (ref 3.87–5.11)
RDW: 14.6 % (ref 11.5–15.5)
WBC: 6.2 10*3/uL (ref 4.0–10.5)
nRBC: 0 % (ref 0.0–0.2)

## 2022-11-23 LAB — CBG MONITORING, ED
Glucose-Capillary: 98 mg/dL (ref 70–99)
Glucose-Capillary: 83 mg/dL (ref 70–99)

## 2022-11-23 LAB — LIPID PANEL
Cholesterol: 139 mg/dL (ref 0–200)
HDL: 74 mg/dL (ref >40)
LDL Cholesterol: 52 mg/dL (ref 0–99)
Total CHOL/HDL Ratio: 1.9 RATIO
Triglycerides: 67 mg/dL (ref <150)
VLDL: 13 mg/dL (ref 0–40)

## 2022-11-23 LAB — TSH: TSH: 1.322 u[IU]/mL (ref 0.350–4.500)

## 2022-11-23 LAB — T4, FREE: Free T4: 0.92 ng/dL (ref 0.61–1.12)

## 2022-11-23 LAB — HEPARIN LEVEL (UNFRACTIONATED): Heparin Unfractionated: 0.53 IU/mL (ref 0.30–0.70)

## 2022-11-23 LAB — TROPONIN I (HIGH SENSITIVITY): Troponin I (High Sensitivity): 295 ng/L (ref <18)

## 2022-11-23 SURGERY — LEFT HEART CATH AND CORONARY ANGIOGRAPHY
Anesthesia: LOCAL

## 2022-11-23 MED ORDER — IOHEXOL 350 MG/ML SOLN
INTRAVENOUS | Status: DC | PRN
  Administered 2022-11-23: 65 mL

## 2022-11-23 MED ORDER — HEPARIN SODIUM (PORCINE) 1000 UNIT/ML IJ SOLN
INTRAMUSCULAR | Status: DC | PRN
  Administered 2022-11-23: 4000 [IU] via INTRAVENOUS

## 2022-11-23 MED ORDER — FENTANYL CITRATE (PF) 100 MCG/2ML IJ SOLN
INTRAMUSCULAR | Status: AC
  Filled 2022-11-23: qty 2

## 2022-11-23 MED ORDER — FENTANYL CITRATE (PF) 100 MCG/2ML IJ SOLN
INTRAMUSCULAR | Status: DC | PRN
  Administered 2022-11-23 (×2): 25 ug via INTRAVENOUS

## 2022-11-23 MED ORDER — SODIUM CHLORIDE 0.9% FLUSH: 3.0000 mL | INTRAVENOUS | Status: DC | PRN

## 2022-11-23 MED ORDER — HEPARIN SODIUM (PORCINE) 1000 UNIT/ML IJ SOLN
INTRAMUSCULAR | Status: AC
  Filled 2022-11-23: qty 10

## 2022-11-23 MED ORDER — SODIUM CHLORIDE 0.9 % IV SOLN: INTRAVENOUS | Status: AC

## 2022-11-23 MED ORDER — MIDAZOLAM HCL 2 MG/2ML IJ SOLN
INTRAMUSCULAR | Status: DC | PRN
  Administered 2022-11-23 (×2): 1 mg via INTRAVENOUS

## 2022-11-23 MED ORDER — VERAPAMIL HCL 2.5 MG/ML IV SOLN
INTRAVENOUS | Status: DC | PRN
  Administered 2022-11-23: 10 mL via INTRA_ARTERIAL

## 2022-11-23 MED ORDER — PERFLUTREN LIPID MICROSPHERE
1.0000 mL | INTRAVENOUS | Status: AC | PRN
  Administered 2022-11-23: 2 mL via INTRAVENOUS

## 2022-11-23 MED ORDER — HYDRALAZINE HCL 20 MG/ML IJ SOLN: 10.0000 mg | INTRAMUSCULAR | Status: AC | PRN

## 2022-11-23 MED ORDER — LABETALOL HCL 5 MG/ML IV SOLN
10.0000 mg | INTRAVENOUS | Status: AC | PRN
  Administered 2022-11-23: 10 mg via INTRAVENOUS
  Filled 2022-11-23: qty 4

## 2022-11-23 MED ORDER — HEPARIN (PORCINE) IN NACL 1000-0.9 UT/500ML-% IV SOLN
INTRAVENOUS | Status: DC | PRN
  Administered 2022-11-23 (×3): 500 mL

## 2022-11-23 MED ORDER — SODIUM CHLORIDE 0.9 % IV SOLN: 250.0000 mL | INTRAVENOUS | Status: DC | PRN

## 2022-11-23 MED ORDER — VERAPAMIL HCL 2.5 MG/ML IV SOLN
INTRAVENOUS | Status: AC
  Filled 2022-11-23: qty 2

## 2022-11-23 MED ORDER — LIDOCAINE HCL (PF) 1 % IJ SOLN
INTRAMUSCULAR | Status: AC
  Filled 2022-11-23: qty 30

## 2022-11-23 MED ORDER — HEPARIN (PORCINE) IN NACL 1000-0.9 UT/500ML-% IV SOLN
INTRAVENOUS | Status: AC
  Filled 2022-11-23: qty 1000

## 2022-11-23 MED ORDER — MIDAZOLAM HCL 2 MG/2ML IJ SOLN
INTRAMUSCULAR | Status: AC
  Filled 2022-11-23: qty 2

## 2022-11-23 MED ORDER — SODIUM CHLORIDE 0.9% FLUSH
3.0000 mL | Freq: Two times a day (BID) | INTRAVENOUS | Status: DC
  Administered 2022-11-24 (×2): 3 mL via INTRAVENOUS

## 2022-11-23 MED ORDER — LIDOCAINE HCL (PF) 1 % IJ SOLN
INTRAMUSCULAR | Status: DC | PRN
  Administered 2022-11-23: 2 mL via INTRADERMAL

## 2022-11-23 SURGICAL SUPPLY — 10 items
CATH 5FR JL3.5 JR4 ANG PIG MP (CATHETERS) IMPLANT
DEVICE RAD COMP TR BAND LRG (VASCULAR PRODUCTS) IMPLANT
GLIDESHEATH SLEND SS 6F .021 (SHEATH) IMPLANT
GUIDEWIRE ANGLED .035X150CM (WIRE) IMPLANT
GUIDEWIRE INQWIRE 1.5J.035X260 (WIRE) IMPLANT
INQWIRE 1.5J .035X260CM (WIRE) ×1
KIT HEART LEFT (KITS) ×2 IMPLANT
PACK CARDIAC CATHETERIZATION (CUSTOM PROCEDURE TRAY) ×2 IMPLANT
TRANSDUCER W/STOPCOCK (MISCELLANEOUS) ×2 IMPLANT
TUBING CIL FLEX 10 FLL-RA (TUBING) ×2 IMPLANT

## 2022-11-23 NOTE — Progress Notes (Signed)
On assessment of patient tonight day nurse Vinnie Level was removing patient's arm band from her cath site right radial and her site started bleeding so arm band was reapplied, No air was needed to stop the bleeding. Will continue to monitor site closely and patient ws instructed to call if swelling occurred or bleeding gets worse. Patient stated she understood. And she will call.

## 2022-11-23 NOTE — Progress Notes (Signed)
ANTICOAGULATION CONSULT NOTE - Follow Up Consult  Pharmacy Consult for heparin Indication:  NSTEMI  Labs: Recent Labs    11/22/22 1512 11/22/22 1730 11/23/22 0313  HGB 12.5  --   --   HCT 36.7  --   --   PLT 358  --   --   HEPARINUNFRC  --   --  0.53  CREATININE 0.92  --   --   TROPONINIHS 199* 353*  --     Assessment/Plan:  81yo female therapeutic on heparin with initial dosing for NSTEMI. Will continue infusion at current rate of 950 units/hr and confirm stable with additional level.   Wynona Neat, PharmD, BCPS  11/23/2022,4:06 AM

## 2022-11-23 NOTE — Progress Notes (Signed)
Echocardiogram 2D Echocardiogram has been performed.  Kelly Kennedy 11/23/2022, 11:21 AM

## 2022-11-23 NOTE — ED Notes (Signed)
Pt resting in bed with no acute distress

## 2022-11-23 NOTE — Interval H&P Note (Signed)
History and Physical Interval Note:  11/23/2022 1:57 PM  Kelly Kennedy  has presented today for surgery, with the diagnosis of NSTEMI.  The various methods of treatment have been discussed with the patient and family. After consideration of risks, benefits and other options for treatment, the patient has consented to  Procedure(s): LEFT HEART CATH AND CORONARY ANGIOGRAPHY ([name]) as a surgical intervention.  The patient's history has been reviewed, patient examined, no change in status, stable for surgery.  I have reviewed the patient's chart and labs.  Questions were answered to the patient's satisfaction.     Sherren Mocha

## 2022-11-23 NOTE — H&P (View-Only) (Signed)
Rounding Note    Patient Name: Kelly Kennedy Date of Encounter: 11/23/2022  [place] Cardiologist: Sanda Klein, MD   Subjective   No chest pain this am. Dyspnea improved while in bed.   Inpatient Medications    Scheduled Meds:  amLODipine  5 mg Oral Daily   aspirin  81 mg Oral Pre-Cath   aspirin EC  81 mg Oral Daily   carvedilol  12.5 mg Oral BID   cholestyramine  4 g Oral TID WC   donepezil  5 mg Oral QHS   fesoterodine  4 mg Oral Daily   insulin aspart  0-9 Units Subcutaneous TID WC   latanoprost  1 drop Both Eyes QHS   levothyroxine  75 mcg Oral Q0600   Lifitegrast  1 drop Both Eyes BID   nitrofurantoin  100 mg Oral Daily   pantoprazole  80 mg Oral Daily   sodium chloride flush  3 mL Intravenous Q12H   sodium chloride flush  3 mL Intravenous Q12H   Continuous Infusions:  sodium chloride     sodium chloride     sodium chloride 1 mL/kg/hr (11/23/22 0610)   heparin 950 Units/hr (11/23/22 0836)   nitroGLYCERIN 25 mcg/min (11/23/22 0837)   PRN Meds: sodium chloride, sodium chloride, acetaminophen, amitriptyline, nitroGLYCERIN, ondansetron (ZOFRAN) IV, sodium chloride flush, sodium chloride flush   Vital Signs    Vitals:   11/23/22 0730 11/23/22 0800 11/23/22 0830 11/23/22 0835  BP: 121/76 126/85 (!) 138/90 (!) 138/90  Pulse: 87 84 85 89  Resp: '17 15 16   '$ Temp:      TempSrc:      SpO2: 98% 98% 99%   Weight:      Height:        Intake/Output Summary (Last 24 hours) at 11/23/2022 0916 Last data filed at 11/23/2022 0837 Gross per 24 hour  Intake 341.57 ml  Output --  Net 341.57 ml      11/22/2022    2:56 PM 08/17/2022   11:46 AM 07/13/2022    3:14 PM  Last 3 Weights  Weight (lbs) 176 lb 5.9 oz 177 lb 178 lb  Weight (kg) 80 kg 80.287 kg 80.74 kg      Telemetry    Sinus - Personally Reviewed  ECG    Sinus, LVH - Personally Reviewed  Physical Exam   GEN: No acute distress.   Neck: No JVD Cardiac: RRR, no murmurs,  rubs, or gallops.  Respiratory: Clear to auscultation bilaterally. GI: Soft, nontender, non-distended  MS: No edema; No deformity. Neuro:  Nonfocal  Psych: Normal affect   Labs    High Sensitivity Troponin:   Recent Labs  Lab 11/22/22 1512 11/22/22 1730 11/23/22 0510  TROPONINIHS 199* 353* 295*     Chemistry Recent Labs  Lab 11/22/22 1512 11/22/22 1730 11/23/22 0510  NA 141  --  141  K 3.1*  --  3.7  CL 106  --  112*  CO2 23  --  22  GLUCOSE 108*  --  120*  BUN 13  --  10  CREATININE 0.92  --  0.95  CALCIUM 9.3  --  8.9  MG  --  1.9  --   GFRNONAA >60  --  >60  ANIONGAP 12  --  7    Lipids  Recent Labs  Lab 11/23/22 0510  CHOL 139  TRIG 67  HDL 74  LDLCALC 52  CHOLHDL 1.9    Hematology Recent Labs  Lab 11/22/22 1512 11/23/22 0510  WBC 6.6 6.2  RBC 4.20 3.87  HGB 12.5 11.8*  HCT 36.7 34.3*  MCV 87.4 88.6  MCH 29.8 30.5  MCHC 34.1 34.4  RDW 14.4 14.6  PLT 358 323   Thyroid  Recent Labs  Lab 11/23/22 0510  TSH 1.322  FREET4 0.92    BNPNo results for input(s): "BNP", "PROBNP" in the last 168 hours.  DDimer No results for input(s): "DDIMER" in the last 168 hours.   Radiology    DG Chest 2 View  Result Date: 11/22/2022 CLINICAL DATA:  Chest pain and shortness of breath for 30 minutes EXAM: CHEST - 2 VIEW COMPARISON:  08/07/2019 FINDINGS: Normal heart size, mediastinal contours, and pulmonary vascularity. Atherosclerotic calcification aorta. Lungs clear. No acute infiltrate, pleural effusion, or pneumothorax. Osseous demineralization. IMPRESSION: No acute abnormalities. Aortic Atherosclerosis (ICD10-I70.0). Electronically Signed   By: Lavonia Dana M.D.   On: 11/22/2022 15:57    Cardiac Studies     Patient Profile     81 y.o. female with history of CAD, HTN, HLD admitted with chest pain and found to have elevated troponin c/w NSTEMI.   Assessment & Plan    CAD/NSTEMI: Cardiac cath in 2019 with moderate distal Circumflex stenosis. Chest  pain/dyspnea concerning for angina. Evidence of ACS with elevated troponin. Cardiac cath is indicated. She is on the cath schedule later today. Will keep her NPO. Continue ASA, beta blocker, IV NTG and IV heparin. She is statin intolerant. We should consider Repatha as an outpatient.    I have reviewed the risks, indications, and alternatives to cardiac catheterization, possible angioplasty, and stenting with the patient. Risks include but are not limited to bleeding, infection, vascular injury, stroke, myocardial infection, arrhythmia, kidney injury, radiation-related injury in the case of prolonged fluoroscopy use, emergency cardiac surgery, and death. The patient understands the risks of serious complication is 1-2 in 2956 with diagnostic cardiac cath and 1-2% or less with angioplasty/stenting.   For questions or updates, please contact [place] Please consult [email or web address] for contact info under        Signed, Lauree Chandler, MD  11/23/2022, 9:16 AM

## 2022-11-23 NOTE — Progress Notes (Signed)
Rounding Note    Patient Name: Kelly Kennedy Date of Encounter: 11/23/2022  [place] Cardiologist: Sanda Klein, MD   Subjective   No chest pain this am. Dyspnea improved while in bed.   Inpatient Medications    Scheduled Meds:  amLODipine  5 mg Oral Daily   aspirin  81 mg Oral Pre-Cath   aspirin EC  81 mg Oral Daily   carvedilol  12.5 mg Oral BID   cholestyramine  4 g Oral TID WC   donepezil  5 mg Oral QHS   fesoterodine  4 mg Oral Daily   insulin aspart  0-9 Units Subcutaneous TID WC   latanoprost  1 drop Both Eyes QHS   levothyroxine  75 mcg Oral Q0600   Lifitegrast  1 drop Both Eyes BID   nitrofurantoin  100 mg Oral Daily   pantoprazole  80 mg Oral Daily   sodium chloride flush  3 mL Intravenous Q12H   sodium chloride flush  3 mL Intravenous Q12H   Continuous Infusions:  sodium chloride     sodium chloride     sodium chloride 1 mL/kg/hr (11/23/22 0610)   heparin 950 Units/hr (11/23/22 0836)   nitroGLYCERIN 25 mcg/min (11/23/22 0837)   PRN Meds: sodium chloride, sodium chloride, acetaminophen, amitriptyline, nitroGLYCERIN, ondansetron (ZOFRAN) IV, sodium chloride flush, sodium chloride flush   Vital Signs    Vitals:   11/23/22 0730 11/23/22 0800 11/23/22 0830 11/23/22 0835  BP: 121/76 126/85 (!) 138/90 (!) 138/90  Pulse: 87 84 85 89  Resp: '17 15 16   '$ Temp:      TempSrc:      SpO2: 98% 98% 99%   Weight:      Height:        Intake/Output Summary (Last 24 hours) at 11/23/2022 0916 Last data filed at 11/23/2022 0837 Gross per 24 hour  Intake 341.57 ml  Output --  Net 341.57 ml      11/22/2022    2:56 PM 08/17/2022   11:46 AM 07/13/2022    3:14 PM  Last 3 Weights  Weight (lbs) 176 lb 5.9 oz 177 lb 178 lb  Weight (kg) 80 kg 80.287 kg 80.74 kg      Telemetry    Sinus - Personally Reviewed  ECG    Sinus, LVH - Personally Reviewed  Physical Exam   GEN: No acute distress.   Neck: No JVD Cardiac: RRR, no murmurs,  rubs, or gallops.  Respiratory: Clear to auscultation bilaterally. GI: Soft, nontender, non-distended  MS: No edema; No deformity. Neuro:  Nonfocal  Psych: Normal affect   Labs    High Sensitivity Troponin:   Recent Labs  Lab 11/22/22 1512 11/22/22 1730 11/23/22 0510  TROPONINIHS 199* 353* 295*     Chemistry Recent Labs  Lab 11/22/22 1512 11/22/22 1730 11/23/22 0510  NA 141  --  141  K 3.1*  --  3.7  CL 106  --  112*  CO2 23  --  22  GLUCOSE 108*  --  120*  BUN 13  --  10  CREATININE 0.92  --  0.95  CALCIUM 9.3  --  8.9  MG  --  1.9  --   GFRNONAA >60  --  >60  ANIONGAP 12  --  7    Lipids  Recent Labs  Lab 11/23/22 0510  CHOL 139  TRIG 67  HDL 74  LDLCALC 52  CHOLHDL 1.9    Hematology Recent Labs  Lab 11/22/22 1512 11/23/22 0510  WBC 6.6 6.2  RBC 4.20 3.87  HGB 12.5 11.8*  HCT 36.7 34.3*  MCV 87.4 88.6  MCH 29.8 30.5  MCHC 34.1 34.4  RDW 14.4 14.6  PLT 358 323   Thyroid  Recent Labs  Lab 11/23/22 0510  TSH 1.322  FREET4 0.92    BNPNo results for input(s): "BNP", "PROBNP" in the last 168 hours.  DDimer No results for input(s): "DDIMER" in the last 168 hours.   Radiology    DG Chest 2 View  Result Date: 11/22/2022 CLINICAL DATA:  Chest pain and shortness of breath for 30 minutes EXAM: CHEST - 2 VIEW COMPARISON:  08/07/2019 FINDINGS: Normal heart size, mediastinal contours, and pulmonary vascularity. Atherosclerotic calcification aorta. Lungs clear. No acute infiltrate, pleural effusion, or pneumothorax. Osseous demineralization. IMPRESSION: No acute abnormalities. Aortic Atherosclerosis (ICD10-I70.0). Electronically Signed   By: Lavonia Dana M.D.   On: 11/22/2022 15:57    Cardiac Studies     Patient Profile     81 y.o. female with history of CAD, HTN, HLD admitted with chest pain and found to have elevated troponin c/w NSTEMI.   Assessment & Plan    CAD/NSTEMI: Cardiac cath in 2019 with moderate distal Circumflex stenosis. Chest  pain/dyspnea concerning for angina. Evidence of ACS with elevated troponin. Cardiac cath is indicated. She is on the cath schedule later today. Will keep her NPO. Continue ASA, beta blocker, IV NTG and IV heparin. She is statin intolerant. We should consider Repatha as an outpatient.    I have reviewed the risks, indications, and alternatives to cardiac catheterization, possible angioplasty, and stenting with the patient. Risks include but are not limited to bleeding, infection, vascular injury, stroke, myocardial infection, arrhythmia, kidney injury, radiation-related injury in the case of prolonged fluoroscopy use, emergency cardiac surgery, and death. The patient understands the risks of serious complication is 1-2 in 4818 with diagnostic cardiac cath and 1-2% or less with angioplasty/stenting.   For questions or updates, please contact [place] Please consult [email or web address] for contact info under        Signed, Lauree Chandler, MD  11/23/2022, 9:16 AM

## 2022-11-24 ENCOUNTER — Inpatient Hospital Stay (HOSPITAL_COMMUNITY): Payer: Medicare Other

## 2022-11-24 ENCOUNTER — Ambulatory Visit: Payer: Medicare Other | Admitting: Family Medicine

## 2022-11-24 ENCOUNTER — Other Ambulatory Visit (HOSPITAL_COMMUNITY): Payer: Self-pay

## 2022-11-24 ENCOUNTER — Encounter (HOSPITAL_COMMUNITY): Payer: Self-pay | Admitting: Cardiovascular Disease

## 2022-11-24 DIAGNOSIS — I2699 Other pulmonary embolism without acute cor pulmonale: Secondary | ICD-10-CM | POA: Diagnosis not present

## 2022-11-24 DIAGNOSIS — I214 Non-ST elevation (NSTEMI) myocardial infarction: Secondary | ICD-10-CM | POA: Diagnosis not present

## 2022-11-24 LAB — GLUCOSE, CAPILLARY
Glucose-Capillary: 95 mg/dL (ref 70–99)
Glucose-Capillary: 80 mg/dL (ref 70–99)
Glucose-Capillary: 95 mg/dL (ref 70–99)
Glucose-Capillary: 90 mg/dL (ref 70–99)

## 2022-11-24 LAB — CBC
HCT: 34.4 % — ABNORMAL LOW (ref 36.0–46.0)
Hemoglobin: 11.9 g/dL — ABNORMAL LOW (ref 12.0–15.0)
MCH: 30.1 pg (ref 26.0–34.0)
MCHC: 34.6 g/dL (ref 30.0–36.0)
MCV: 86.9 fL (ref 80.0–100.0)
Platelets: 325 10*3/uL (ref 150–400)
RBC: 3.96 MIL/uL (ref 3.87–5.11)
RDW: 14.4 % (ref 11.5–15.5)
WBC: 6.1 10*3/uL (ref 4.0–10.5)
nRBC: 0 % (ref 0.0–0.2)

## 2022-11-24 LAB — D-DIMER, QUANTITATIVE: D-Dimer, Quant: 11.2 ug/mL-FEU — ABNORMAL HIGH (ref 0.00–0.50)

## 2022-11-24 LAB — HEMOGLOBIN A1C
Hgb A1c MFr Bld: 6.1 % — ABNORMAL HIGH (ref 4.8–5.6)
Mean Plasma Glucose: 128 mg/dL

## 2022-11-24 LAB — LIPOPROTEIN A (LPA): Lipoprotein (a): 60.8 nmol/L — ABNORMAL HIGH (ref <75.0)

## 2022-11-24 MED ORDER — APIXABAN 5 MG PO TABS
10.0000 mg | ORAL_TABLET | Freq: Two times a day (BID) | ORAL | Status: DC
  Administered 2022-11-24 – 2022-11-25 (×3): 10 mg via ORAL
  Filled 2022-11-24 (×3): qty 2

## 2022-11-24 MED ORDER — APIXABAN 5 MG PO TABS: 5.0000 mg | ORAL_TABLET | Freq: Two times a day (BID) | ORAL | Status: DC

## 2022-11-24 MED ORDER — IOHEXOL 350 MG/ML SOLN
50.0000 mL | Freq: Once | INTRAVENOUS | Status: AC | PRN
  Administered 2022-11-24: 50 mL via INTRAVENOUS

## 2022-11-24 NOTE — Consult Note (Addendum)
   [facility] CM Inpatient Consult   11/24/2022  Kelly Kennedy February 06, 1941 [id number]  [place] [ACO] Patient: [name]  11/25/22 12:27 pm Met with patient at the bedside and she is ready to go home she states her daughter [name] assist her with transportation and needs.   She states she does have access to food stamps.  This Probation officer reviewed inpt. TOC LCSW notes regarding food insecurity as well.  Explained how [place] can follow up as well and referral request is in place and patient agrees and states it's best to speak with daughter, [name], as "my memory has been affected every since I had COVID a while back."  Primary Care Provider:  [name], MD, [place] [facility] is listed to provide the transition of care follow up noted on AVS  Patient screened for new diagnosis hospitalization with noted low risk score for unplanned readmission risk to assess for potential [facility] service needs for post hospital transition for care coordination.  Review of patient's electronic medical record reveals patient is for home.  Called patient's bedside phone to follow up however no answer to address food insecurity noted noted on SDOH wheel. Patient is for discharge home.  Plan:   Referral request for community care coordination: request follow up on SDOH  Of note, [place] does not replace or interfere with any arrangements made by the Inpatient Transition of Care team.  For questions contact:   Natividad Brood, RN BSN [place]  [phone] business mobile phone Toll free office [phone]  *[place]  [phone] Fax number: [phone] [name]

## 2022-11-24 NOTE — Discharge Instructions (Addendum)
Information on my medicine - ELIQUIS (apixaban)  This medication education was reviewed with me or my healthcare representative as part of my discharge preparation.  The pharmacist that spoke with me during my hospital stay was:  Einar Grad, [facility]  Why was Eliquis prescribed for you? Eliquis was prescribed to treat blood clots that may have been found in the veins of your legs (deep vein thrombosis) or in your lungs (pulmonary embolism) and to reduce the risk of them occurring again.  What do You need to know about Eliquis ? The starting dose is 10 mg (two 5 mg tablets) taken TWICE daily for the FIRST SEVEN (7) DAYS, then on (enter date)  12/01/22  the dose is reduced to ONE 5 mg tablet taken TWICE daily.  Eliquis may be taken with or without food.   Try to take the dose about the same time in the morning and in the evening. If you have difficulty swallowing the tablet whole please discuss with your pharmacist how to take the medication safely.  Take Eliquis exactly as prescribed and DO NOT stop taking Eliquis without talking to the doctor who prescribed the medication.  Stopping may increase your risk of developing a new blood clot.  Refill your prescription before you run out.  After discharge, you should have regular check-up appointments with your healthcare provider that is prescribing your Eliquis.    What do you do if you miss a dose? If a dose of ELIQUIS is not taken at the scheduled time, take it as soon as possible on the same day and twice-daily administration should be resumed. The dose should not be doubled to make up for a missed dose.  Important Safety Information A possible side effect of Eliquis is bleeding. You should call your healthcare provider right away if you experience any of the following: Bleeding from an injury or your nose that does not stop. Unusual colored urine (red or dark brown) or unusual colored stools (red or black). Unusual bruising for  unknown reasons. A serious fall or if you hit your head (even if there is no bleeding).  Some medicines may interact with Eliquis and might increase your risk of bleeding or clotting while on Eliquis. To help avoid this, consult your healthcare provider or pharmacist prior to using any new prescription or non-prescription medications, including herbals, vitamins, non-steroidal anti-inflammatory drugs (NSAIDs) and supplements.  This website has more information on Eliquis (apixaban): http://www.eliquis.com/eliquis/home   Loews [employer]. If you are behind on your bills and expenses, and need some help to make it through a short term hardship or financial emergency, there are several organizations and charities in the [place] and [place] area that may be able to help. They range from the Boeing, Pacific [employer], Air cabin crew of [place]. These groups may be able to provide you resources to help pay your utility bills, rent, and they even offer housing assistance.  Crisis assistance program Find help for paying your rent, electric bills, free food, and even funds to pay your mortgage. The [employer] (440)309-6795) offers several services to local families, as funding allows. The Emergency Assistance Program (EAP), which they administer, provides household goods, free food, clothing, and financial aid to people in need in the [facility] area. The EAP program does have some qualification, and counselors will interview clients for financial assistance by written referral only. Referrals  need to be made by the Department of Social Services or by other EAP approved human services agencies or charities in the area.  Money for resources for emergency assistance are available for security deposits for rent, water, electric, and gas, past due rent,  utility bills, past due mortgage payments, food, and clothing. The [employer] also operates a Building surveyor on the site. More Pacific Mutual.  Open Door Ministries of [employer], which can be reached at ([phone], offers emergency assistance programs for those in need of help, such as food, rent assistance, a soup kitchen, shelter, and clothing. They are based in [facility] but provide a number of services to those that qualify for assistance. Continue with Open Door Ministries programs.  [place] may be able to offer temporary financial assistance and cash grants for paying rent and utilities. Help may be provided for local county residents who may be experiencing personal crisis when other resources, including government programs, are not available. Call [phone]  [place]. [place] Nanny Society, which is based in [place], provides financial assistance of up to $50.00 to help pay for rent, utilities, cooling bills, rent, and prescription medications. The program also provides secondhand furniture to those in need. [phone]  [employer] is a Geographical information systems officer. The organization can offer emergency assistance for paying rent, electric bills, utilities, food, household products and furniture. They offer extensive emergency and transitional housing for families, children and single women, and also run a 80 and Brunswick Corporation. [place], Capital One, and other aid offered too. [address] [place], [place], [phone]  Additional locations of the Boeing are in [place] and other nearby communities. When you have an emergency, need free food, money for basic needs, or just need assistance around Christmas, then the Boeing may have the resources you need. Or they can refer you to nearby agencies. Learn more.  [place] - This is offered for [facility] families. The federal government created Berkshire Hathaway Program provides a one-time cash grant payment to help eligible low-income families pay their electric and heating bills. [address], [place], [place] [address], [phone]  Government and [place] administers several emergency and self-sufficiency programs. Residents of [place] can get help with energy bills and food, rent, and other expenses. In addition, work with a Tourist information centre manager who may be able to help you find a job or improve your employment skills. More [place] public assistance.  [place] Emergency Assistance - A program offers emergency utility and rent funds for greater [employer] area residents. The program can also provide counseling and referrals to charities and government programs. Also provides food and a free meal program that serves lunch Mondays - Saturdays and dinner seven days per week to individuals in the community. [address], [place], [place], [phone]  [place] affordable apartment and housing communities across [place] and [place]. The low income and seniors can access public housing, rental assistance to qualified applicants, and apply for the section 8 rent subsidy program. Other programs include Visual merchandiser and Barrister's clerk. [address], [place], [place], dial [phone].  Basic needs such as clothing - Low income families can receive free items (school supplies, clothes, holiday assistance, etc.)  from clothing closets while more moderate income [facility] families can shop at [employer]. Locations across the area help the needy. Get information on [place] free clothing centers.  The [facility] provides transitional housing to veterans and the  disabled. Clients will also access other services too, including life skills classes, case management, and assistance in finding permanent housing. [address] [place], [place], call [phone]  [place] in [place] is for people who were just evicted or that are formerly homeless. The non-profit will also help then gain self-sufficiency, find a home or apartment to live in, and also provides information on rent assistance when needed. Dial [phone]  [employer] Partnership to End Homelessness is available in [place]. Families that were evicted or that are homeless can gain shelter, food, clothing, furniture, and also emergency financial assistance. Other services include financial skills and life skills coaching, job training, and case management. [address] [place], [place] [address]. Telephone [phone].  The [place]. This can help people save money on their heating and summer cooling bills, and is free to low income families. Free upgrades can be made to your home. Phone [phone]  Many of the non-profits and programs mentioned above are all inclusive, meaning they can meet many needs of the low income, such as energy bills, food, rent, and more. However there are several organizations that focus just on rent and housing. Read more on rent assistance in [place] region.  Legal assistance for evictions, foreclosure, and more If you need free legal advice on civili issues, such as foreclosures, evictions, Mudlogger, government programs, domestic issues and more, Scientist, research (physical sciences) Aid of [facility]) is a Teacher, adult education firm that provides free legal services and counsel to lower income people, seniors, disabled, and others. The goal is to ensure everyone has access to justice and fair representation.  Call them at [phone], or click  here to learn more about [place] free legal assistance programs.  [place] grants and funds for emergency expenses The Boeing is another organization that can provide people with Fluor Corporation and funds to pay bills. Their assistance depends on funding, and the demand for help is always very high. They can provide cash to help pay rent, a missed mortgage payment, or gas, electric, and water bills. But the assistance doesn't stop there. They also have a food pantry on site, which can provide food once every three (3) months to people who need help. The Nash-Finch Company can also offer a Ambulance person once every three (3) months for a maximum three (3) times. After receiving this voucher over that period of time, applicants can receive this aid one every six (6) months after that. [phone].  [place]. offers job and Database administrator. Resources are focused on helping students obtain the skills and experiences that are necessary to compete in today's challenging and tight job market. The non-profit faith-based community action agency offers internship trainings as well as classroom instruction. Economically disadvantaged and challenged individuals and potential employers can use their services. Classes are tailored to meet the needs of people in the [facility] region. [place],  [address], [phone]   Foreclosure prevention services Housing Counseling and Education is also offered by [employer] of the [place]. The agency (phone number is below) is a Emergency planning/management officer housing counseling  agency providing foreclosure advice and counseling. They offer mortgage resolution counseling and also reverse mortgage counseling. Counselors can direct people to both Kohl's, as well as Federal-Mogul foreclosure assistance options.  Environmental health practitioner has locations in [place] and [employer]. They run debt and foreclosure prevention programs for local families. A sampling of the programs offered include both Budget and Housing Counseling. This includes money management, financial advice, budget review and development of a written action plan with a Higher education careers adviser to help solve specific individual financial problems. In addition, housing and mortgage counselors can also provide pre- and post-purchase homeownership counseling, default resolution counseling (to prevent foreclosure) and reverse mortgage counseling. A Debt Management Program allows people and families with a high level of credit card or medical debt to consolidate and repay consumer debt and loans to creditors and rebuild positive credit ratings and scores. [phone]  Debt assistance programs Receive free counseling and debt help from [facility] of the [place]. The [facility] based agency can be reached at [phone]. The counselors provide free help, and the services include budget counseling. This will help people manage their expenses and set goals. They also offer a Materials engineer, which will help individuals consolidate their debts and become debt free. Most of the workshops and services are free.  Community clinics in [place] of the leading health and dental centers are listed below. They may be able to provide medication, physicals, dental care, and general family care to residents of all incomes and backgrounds across the region. Some of the programs focus on the low income and underinsured. However if these clinics can't meet your needs, find information and details on more clinics in [facility].  Some of the options include [employer] of [employer]. This center provides free or low cost health care to low-income adults 18 - 64, who have no health insurance. Among other  services offered include a pharmacy and eye clinic. Phone [phone]  [facility], which is located in [place], is a community clinic that provides primary medical and health care to uninsured and underinsured adults and families, as well as the low income, in the greater [place] area on a sliding-fee scale. Call [phone]  [place] [facility] - They run a dental assistance program that is organized by [place]. to provide dental services and aid to [employer]. Services offered by the dental clinic are limited to extractions, pain management, and minor restorative care. [phone]  [place] has locations in [place]. The community clinics provide complete pediatric care including primary health, mental health, social work, neurology, cardiology, asthma. Dial [phone].  In addition to those Kennan and Fisher [employer], find other free community clinics in [place] and across the county.  Food pantry and assistance Some of the local food pantries and distribution centers to call for free food and groceries include The Hive of [place] (phone [phone]), [facility] (phone [phone]) and also Hughes Supply. Dial [phone].  Several other food banks in the region provide clothing, free food and meals, access to soup kitchens and other help. Find the addresses and phone numbers of more food pantries in [place]. http://www.needhelppayingbills.com/html/[place]_county_assistance_pro.html  The Little Green Book FREE Meals in [place] When, Where, and How to Get There Page 2 of 8 Page 2 of  [place] TWO LOCATIONS: 8:00am served in [facility] by Bernice 8:30am SHUTTLE provided from [facility], served at [employer] Stores, [address] LUNCH TWO  LOCATIONS [plus one additional third Sunday only] 10:30am - 12:30pm served at CMS Energy Corporation, Pacific Mutual, Candelaria Arenas [place] (1.2 miles from [facility]) 12:30pm served in [place] by BJ's Team ([place] Sunday only) 1:30pm served at [facility] by [facility] one location [plus one additional third Sunday only] 5:00pm Every Sunday, served under the bridge at [place]. by [place] (.7 miles from [facility]) ([place] Sunday ONLY) 4:00pm served in the parking garage, across from Sempra Energy, corner of [place] and [place] by H. J. Heinz Works Ministries MONDAYS BREAKFAST 7:30am served in Sempra Energy by the Health Net and Friends LUNCH 10:30am - 12:30pm served at CMS Energy Corporation, Pacific Mutual, [place] (1.2 miles from [facility]) [place]: 7:00pm served in front of the courthouse at the corner of Pitney Bowes and Tech Data Corporation. by [place] [facility] Monday Night Meal (3 blocks from [facility]) 4:30pm served at the Time Warner, Nina Selma by Peter Kiewit Sons Not Bombs (0.6 miles from [facility]) [place] served at PepsiCo, [place] (0.3 miles from [place]) LUNCH 10:30am - 12:30pm served at the CMS Energy Corporation, [place] [address] (1.2 miles from [place]) [place] 6:00pm served at Dole Food, enter from Brink's Company and go to the Omnicom, (0.7 miles from [place]) [place] 7:00am - 8:00am served at [employer] Energy Corporation, [place] [address] (1.2 miles from [place]) [place] [plus two additional locations listed below] 10:30am - 12:30pm served at [employer] Energy Corporation, [place] [address] (1.2 miles from [place]) (FIRST Wednesday  ONLY) 11:30am served at Kelly Services, [place] (6.6 miles from [place]) ([place] Wednesday ONLY) 11:00am served at [place], [place] (1.3 miles from [place]) [place] 6:00pm served at Amgen Inc, St. Joseph Wachovia Corporation. (1.3 miles from [facility]) 4:00pm - 6:00pm (hot dogs and chips) served at NCR Corporation of [facility], [place] (1.7 miles from [place]) [place] 10:30am - 12:30pm served at CMS Energy Corporation, Pacific Mutual, [place] [address] (1.2 miles from [place]) [place] 6:00pm served at Dole Food, enter from Brink's Company and go to the Omnicom, (0.7 miles from Sempra Energy) St. Francis 10:30am - 12:30pm served at CMS Energy Corporation, [place] [address] (1.2 miles from [place]) [place], [plus one additional first Friday only] 6:00pm served under the bridge at [place]. by [place]. (.7 miles from [facility]) 5:00pm - 7:00pm served at NCR Corporation of [facility], [place] (1.7 miles from [place]) (FIRST Friday ONLY) 5:45 pm - SHUTTLE provided from the [place] at 5:45pm. Served at [facility], [place] [plus one additional last Saturday only] 8:00am served at [facility]  Danaher Corporation by Smithfield Foods 8:30am served at Land O'Lakes, Weir KB Home	[place]. (2.2 miles from [facility]) (LAST Saturday ONLY) 8:30am served at Sanmina-SCI, [place] (5 miles from [place]) LUNCH 10:30am - 12:30pm served at CMS Energy Corporation, Pacific Mutual [place] Sharolyn Douglas., (1.2 miles from [facility]) [place] 6:00pm served under the bridge at [place]. by The Procter & Gamble (0.7 miles from Sempra Energy)  [place] at [place]. (.7 miles from [place]) [place] on [place] Right onto Northwest Airlines 433 ft. Continue onto [address] under bridge, about 500 ft. Courthouse (3 blocks from [facility]) [place] on [place]set right on [place] 1 block to Parker Hannifin (.5 miles from Liberty) [place] on [place]. Temple-Inland. past WellPoint to Cablevision Systems. Enter from Brink's Company and go to the Office Depot building Amgen Inc [address] W[address]. (1.3 miles from [facility]) [place] on [place] Right onto W. Sharolyn Douglas. church will be on the Left. [place] [address] (.3 miles from [facility]) Go .3 miles on W. Friendly Destination is on your right Kelly Services at American International Group (6.6 miles from [place]) [place] on [place] toward [address] Turn right onto [address] Continue onto Verizon. Continue onto [place] is on right [facility] AK Steel Holding Corporation) [place] [address] (.6 miles from [place]) [place] on [place]. [address] Turn Left onto E. [place]. 0.3 miles Destination is on the Left. [place] at R.R. Donnelley (5 miles from Sempra Energy) 1. Head south on [place] right onto [address] Turn slightly left onto Colgate-Palmolive Continue onto Colgate-Palmolive Turn right at [place] to church on right [place] Sounds of [facility] [address]/[place] (1.7 miles from [place]) [place] on [place] 1.4 miles [place] becomes [place]. [place] [address] Continue 0.6 miles and church will be on theright. [place] at [address] (2.5 miles from  Sempra Energy) [place] provided from [place] on [place]. Elm toward Lowe's Companies right onto El Paso Corporation left onto Office Depot left onto Elberta Baxter International (2.2 miles from [place]) [place] on [place] 1.4 miles [place] becomes [place]. Alliance Turn right onto [place]. and church will be on the Left. [place]'s House/[place] ArvinMeritor [place] (1.2 miles from [facility]) 1.Turn right onto [facility] 2.Turn left onto Carlos [place] 3.[place]clare [place] 4.Destination is on your right [place] at Home Depot (1.3 miles from [place] Oil City LLC) Erin on [place] left onto Emerson Electric right onto S. [place] onto Fiserv. Turn left onto [place] right onto Campbell Soup.

## 2022-11-24 NOTE — Progress Notes (Signed)
Lower extremity venous bilateral study completed.  Preliminary results relayed to Curt Bears, RN.  See CV Proc for preliminary results report.   Darlin Coco, RDMS, RVT

## 2022-11-24 NOTE — Progress Notes (Signed)
Rounding Note    Patient Name: Dameka Earleen Newport Date of Encounter: 11/24/2022  Speculator Cardiologist: Sanda Klein, MD   Subjective   No chest pain. No dyspnea in bed but she has not walked yet.   Inpatient Medications    Scheduled Meds:  amLODipine  5 mg Oral Daily   aspirin EC  81 mg Oral Daily   carvedilol  12.5 mg Oral BID   cholestyramine  4 g Oral TID WC   donepezil  5 mg Oral QHS   fesoterodine  4 mg Oral Daily   insulin aspart  0-9 Units Subcutaneous TID WC   latanoprost  1 drop Both Eyes QHS   levothyroxine  75 mcg Oral Q0600   nitrofurantoin  100 mg Oral Daily   pantoprazole  80 mg Oral Daily   sodium chloride flush  3 mL Intravenous Q12H   sodium chloride flush  3 mL Intravenous Q12H   sodium chloride flush  3 mL Intravenous Q12H   Continuous Infusions:  sodium chloride     sodium chloride     nitroGLYCERIN Stopped (11/23/22 1444)   PRN Meds: sodium chloride, sodium chloride, acetaminophen, amitriptyline, nitroGLYCERIN, ondansetron (ZOFRAN) IV, sodium chloride flush, sodium chloride flush   Vital Signs    Vitals:   11/23/22 2000 11/23/22 2221 11/23/22 2300 11/24/22 0300  BP: 138/67 134/70 (!) 140/78 133/71  Pulse: 82 90 86 86  Resp: '16  19 20  '$ Temp:   98 F (36.7 C) 97.9 F (36.6 C)  TempSrc:   Oral Oral  SpO2: 99%  99% 96%  Weight:      Height:        Intake/Output Summary (Last 24 hours) at 11/24/2022 0706 Last data filed at 11/23/2022 2336 Gross per 24 hour  Intake 1041.92 ml  Output 650 ml  Net 391.92 ml      11/22/2022    2:56 PM 08/17/2022   11:46 AM 07/13/2022    3:14 PM  Last 3 Weights  Weight (lbs) 176 lb 5.9 oz 177 lb 178 lb  Weight (kg) 80 kg 80.287 kg 80.74 kg      Telemetry    Sinus - Personally Reviewed  ECG    NSR- Personally Reviewed  Physical Exam   General: Well developed, well nourished, NAD  HEENT: OP clear SKIN: warm, dry. No rashes. Neuro: No focal deficits  Musculoskeletal:  Muscle strength 5/5 all ext  Psychiatric: Mood and affect normal  Neck: No JVD Lungs:Clear bilaterally, no wheezes, rhonci, crackles Cardiovascular: Regular rate and rhythm. No murmurs, gallops or rubs. Abdomen:Soft.  Extremities: No lower extremity edema.   Labs    High Sensitivity Troponin:   Recent Labs  Lab 11/22/22 1512 11/22/22 1730 11/23/22 0510  TROPONINIHS 199* 353* 295*     Chemistry Recent Labs  Lab 11/22/22 1512 11/22/22 1730 11/23/22 0510  NA 141  --  141  K 3.1*  --  3.7  CL 106  --  112*  CO2 23  --  22  GLUCOSE 108*  --  120*  BUN 13  --  10  CREATININE 0.92  --  0.95  CALCIUM 9.3  --  8.9  MG  --  1.9  --   GFRNONAA >60  --  >60  ANIONGAP 12  --  7    Lipids  Recent Labs  Lab 11/23/22 0510  CHOL 139  TRIG 67  HDL 74  LDLCALC 52  CHOLHDL 1.9  Hematology Recent Labs  Lab 11/22/22 1512 11/23/22 0510 11/24/22 0025  WBC 6.6 6.2 6.1  RBC 4.20 3.87 3.96  HGB 12.5 11.8* 11.9*  HCT 36.7 34.3* 34.4*  MCV 87.4 88.6 86.9  MCH 29.8 30.5 30.1  MCHC 34.1 34.4 34.6  RDW 14.4 14.6 14.4  PLT 358 323 325   Thyroid  Recent Labs  Lab 11/23/22 0510  TSH 1.322  FREET4 0.92    BNPNo results for input(s): "BNP", "PROBNP" in the last 168 hours.  DDimer No results for input(s): "DDIMER" in the last 168 hours.   Radiology    CARDIAC CATHETERIZATION  Result Date: 11/23/2022 Patent left main with no significant stenosis Patent LAD with mild non-obstructive plaque at the first diagonal Patent, dominant LCx with mild nonobstructive plaque in the proximal and mid-vessel Nondominant RCA without significant stenosis Normal LVEDP Recommend: medical therapy for nonobstructive CAD. Consider non-cardiac symptoms.   ECHOCARDIOGRAM COMPLETE  Result Date: 11/23/2022    ECHOCARDIOGRAM REPORT   Patient Name:   ZARIA TAHA Lottman Date of Exam: 11/23/2022 Medical Rec #:  [id number]            Height:       65.0 in Accession #:    [id number]           Weight:        176.4 lb Date of Birth:  October 18, 1941            BSA:          1.875 m Patient Age:    81 years             BP:           124/79 mmHg Patient Gender: F                    HR:           80 bpm. Exam Location:  Inpatient Procedure: 2D Echo, Cardiac Doppler and Color Doppler Indications:    NSTEMI 121.4  History:        Patient has prior history of Echocardiogram examinations, most                 recent 12/02/2014. CAD, Previous Myocardial Infarction and Angina,                 TIA, Arrythmias:PVC; Risk Factors:Hypertension, Diabetes and                 Dyslipidemia. Breast Cancer.  Sonographer:    Ronny Flurry Referring Phys: 43 [place]  1. Left ventricular ejection fraction, by estimation, is 60 to 65%. The left ventricle has normal function. The left ventricle has no regional wall motion abnormalities. There is moderate asymmetric left ventricular hypertrophy of the basal-septal segment. Left ventricular diastolic parameters are consistent with Grade I diastolic dysfunction (impaired relaxation).  2. Right ventricular systolic function is normal. The right ventricular size is normal.  3. The mitral valve is grossly normal. Trivial mitral valve regurgitation. No evidence of mitral stenosis.  4. The aortic valve is tricuspid. There is mild calcification of the aortic valve. There is mild thickening of the aortic valve. Aortic valve regurgitation is not visualized. Aortic valve sclerosis/calcification is present, without any evidence of aortic stenosis.  5. The inferior vena cava is dilated in size with <50% respiratory variability, suggesting right atrial pressure of 15 mmHg. Comparison(s): Compared to prior TTE report in 2016, the LVEF appears more robust (previously reported as  50-55%). Otherwise, there is no significant change. FINDINGS  Left Ventricle: Left ventricular ejection fraction, by estimation, is 60 to 65%. The left ventricle has normal function. The left ventricle has no regional  wall motion abnormalities. The left ventricular internal cavity size was normal in size. There is  moderate asymmetric left ventricular hypertrophy of the basal-septal segment. Left ventricular diastolic parameters are consistent with Grade I diastolic dysfunction (impaired relaxation). Right Ventricle: The right ventricular size is normal. No increase in right ventricular wall thickness. Right ventricular systolic function is normal. Left Atrium: Left atrial size was normal in size. Right Atrium: Right atrial size was normal in size. Pericardium: There is no evidence of pericardial effusion. Mitral Valve: The mitral valve is grossly normal. There is mild thickening of the mitral valve leaflet(s). There is mild calcification of the mitral valve leaflet(s). Trivial mitral valve regurgitation. No evidence of mitral valve stenosis. Tricuspid Valve: The tricuspid valve is normal in structure. Tricuspid valve regurgitation is mild. Aortic Valve: The aortic valve is tricuspid. There is mild calcification of the aortic valve. There is mild thickening of the aortic valve. Aortic valve regurgitation is not visualized. Aortic valve sclerosis/calcification is present, without any evidence of aortic stenosis. Aortic valve mean gradient measures 2.0 mmHg. Aortic valve peak gradient measures 4.0 mmHg. Aortic valve area, by VTI measures 2.48 cm. Pulmonic Valve: The pulmonic valve was not well visualized. Aorta: The aortic root is normal in size and structure. Venous: The inferior vena cava is dilated in size with less than 50% respiratory variability, suggesting right atrial pressure of 15 mmHg. IAS/Shunts: The atrial septum is grossly normal.  LEFT VENTRICLE PLAX 2D LVIDd:         3.60 cm     Diastology LVIDs:         2.70 cm     LV e' medial:    6.39 cm/s LV PW:         1.10 cm     LV E/e' medial:  8.6 LV IVS:        0.90 cm     LV e' lateral:   8.59 cm/s LVOT diam:     1.85 cm     LV E/e' lateral: 6.4 LV SV:         49 LV SV  Index:   26 LVOT Area:     2.69 cm  LV Volumes (MOD) LV vol d, MOD A2C: 47.1 ml LV vol d, MOD A4C: 74.5 ml LV vol s, MOD A2C: 15.5 ml LV vol s, MOD A4C: 19.5 ml LV SV MOD A2C:     31.6 ml LV SV MOD A4C:     74.5 ml LV SV MOD BP:      44.4 ml RIGHT VENTRICLE            IVC RV S prime:     6.16 cm/s  IVC diam: 2.30 cm TAPSE (M-mode): 1.4 cm LEFT ATRIUM           Index       RIGHT ATRIUM          Index LA diam:      2.10 cm 1.12 cm/m  RA Area:     5.79 cm LA Vol (A2C): 10.1 ml 5.39 ml/m  RA Volume:   10.20 ml 5.44 ml/m LA Vol (A4C): 16.7 ml 8.91 ml/m  AORTIC VALVE AV Area (Vmax):    2.54 cm AV Area (Vmean):   2.44 cm AV Area (VTI):  2.48 cm AV Vmax:           100.00 cm/s AV Vmean:          67.300 cm/s AV VTI:            0.199 m AV Peak Grad:      4.0 mmHg AV Mean Grad:      2.0 mmHg LVOT Vmax:         94.40 cm/s LVOT Vmean:        61.200 cm/s LVOT VTI:          0.184 m LVOT/AV VTI ratio: 0.92  AORTA Ao Root diam: 2.60 cm MITRAL VALVE               TRICUSPID VALVE MV Area (PHT): 3.54 cm    TV Peak grad:   17.1 mmHg MV Decel Time: 214 msec    TV Vmax:        2.07 m/s MV E velocity: 55.15 cm/s  TR Peak grad:   14.4 mmHg MV A velocity: 71.65 cm/s  TR Vmax:        190.00 cm/s MV E/A ratio:  0.77                            SHUNTS                            Systemic VTI:  0.18 m                            Systemic Diam: 1.85 cm Gwyndolyn Kaufman MD Electronically signed by Gwyndolyn Kaufman MD Signature Date/Time: 11/23/2022/1:25:56 PM    Final    DG Chest 2 View  Result Date: 11/22/2022 CLINICAL DATA:  Chest pain and shortness of breath for 30 minutes EXAM: CHEST - 2 VIEW COMPARISON:  08/07/2019 FINDINGS: Normal heart size, mediastinal contours, and pulmonary vascularity. Atherosclerotic calcification aorta. Lungs clear. No acute infiltrate, pleural effusion, or pneumothorax. Osseous demineralization. IMPRESSION: No acute abnormalities. Aortic Atherosclerosis (ICD10-I70.0). Electronically Signed   By: Lavonia Dana M.D.   On: 11/22/2022 15:57    Cardiac Studies   See above  Patient Profile     81 y.o. female with history of CAD, HTN, HLD admitted with chest pain and found to have elevated troponin.   Assessment & Plan    CAD/Elevated troponin: Cardiac cath with mild non-obstructive disease. Echo with normal LV function with no wall motion abnormalities. Unclear etiology of chest pain and mild elevation in troponin.  -Continue ASA and beta blocker. She is statin intolerant. We should consider Repatha as an outpatient.  -Check d-dimer this am. If abnormal, will plan chest CTA to exclude PE.   For questions or updates, please contact [facility] Please consult [email or web address] for contact info under       Signed, Lauree Chandler, MD  11/24/2022, 7:06 AM

## 2022-11-24 NOTE — Progress Notes (Signed)
Mobility Specialist Progress Note    11/24/22 0828  Mobility  Activity Ambulated with assistance in hallway  Level of Assistance Contact guard assist, steadying assist  Assistive Device Other (Comment) (HHA)  Distance Ambulated (ft) 350 ft  Activity Response Tolerated well  Mobility Referral Yes  $Mobility charge 1 Mobility   Pre-Mobility: 87 HR, 98% SpO2 During Mobility: 121 HR, 97% SpO2 Post-Mobility: 109 HR, 99% SpO2  Pt received in bed and agreeable. No complaints on walk. Returned to chair with call bell in reach.    Hildred Alamin Mobility Specialist  Please Psychologist, sport and exercise or Rehab Office at [phone]

## 2022-11-24 NOTE — Progress Notes (Addendum)
   Ddimer resulted at 11.20 this morning. Ordered CT angio for rule out PE.   Addendum:   CT angio positive for acute PE with reported evidence of RH strain. Echo 12/27 with normal RV size and function. She is hemodynamically, O2 sats stable on RA. -- Will start Eliquis '10mg'$  BID, PE dosing and observe in the hospital today. Likely home tomorrow.   Barnet Pall, NP-C 11/24/2022, 9:33 AM Pager: [phone]

## 2022-11-24 NOTE — TOC Benefit Eligibility Note (Signed)
Patient Advocate Encounter  Insurance verification completed.    The patient is currently admitted and upon discharge could be taking Eliquis 5 mg.  The current 30 day co-pay is $0.00.   The patient is insured through [employer] Medicare Part D   Ryne Mctigue, CPHT Pharmacy Patient Advocate Specialist  Pharmacy Patient Advocate Team Direct Number: [phone]  Fax: [phone]       

## 2022-11-25 ENCOUNTER — Other Ambulatory Visit (HOSPITAL_COMMUNITY): Payer: Self-pay

## 2022-11-25 DIAGNOSIS — R0602 Shortness of breath: Secondary | ICD-10-CM

## 2022-11-25 DIAGNOSIS — I2699 Other pulmonary embolism without acute cor pulmonale: Secondary | ICD-10-CM | POA: Diagnosis not present

## 2022-11-25 DIAGNOSIS — I82409 Acute embolism and thrombosis of unspecified deep veins of unspecified lower extremity: Secondary | ICD-10-CM | POA: Insufficient documentation

## 2022-11-25 DIAGNOSIS — I2510 Atherosclerotic heart disease of native coronary artery without angina pectoris: Secondary | ICD-10-CM

## 2022-11-25 LAB — ECHOCARDIOGRAM COMPLETE
AR max vel: 2.54 cm2
AV Area VTI: 2.48 cm2
AV Area mean vel: 2.44 cm2
AV Mean grad: 2 mmHg
AV Peak grad: 4 mmHg
Ao pk vel: 1 m/s
Area-P 1/2: 3.54 cm2
Calc EF: 71.3 %
Height: 65 in
S' Lateral: 2.7 cm
Single Plane A2C EF: 67.1 %
Single Plane A4C EF: 73.8 %
Weight: 2821.89 oz

## 2022-11-25 LAB — CBC
HCT: 34.2 % — ABNORMAL LOW (ref 36.0–46.0)
Hemoglobin: 11.4 g/dL — ABNORMAL LOW (ref 12.0–15.0)
MCH: 29.4 pg (ref 26.0–34.0)
MCHC: 33.3 g/dL (ref 30.0–36.0)
MCV: 88.1 fL (ref 80.0–100.0)
Platelets: 307 10*3/uL (ref 150–400)
RBC: 3.88 MIL/uL (ref 3.87–5.11)
RDW: 14.5 % (ref 11.5–15.5)
WBC: 6.4 10*3/uL (ref 4.0–10.5)
nRBC: 0 % (ref 0.0–0.2)

## 2022-11-25 LAB — GLUCOSE, CAPILLARY
Glucose-Capillary: 104 mg/dL — ABNORMAL HIGH (ref 70–99)
Glucose-Capillary: 125 mg/dL — ABNORMAL HIGH (ref 70–99)

## 2022-11-25 MED ORDER — PERFLUTREN LIPID MICROSPHERE: 1.0000 mL | INTRAVENOUS | Status: AC | PRN

## 2022-11-25 MED ORDER — APIXABAN (ELIQUIS) VTE STARTER PACK (10MG AND 5MG)
ORAL_TABLET | ORAL | 0 refills | Status: DC
  Filled 2022-11-25 (×2): qty 74, 30d supply, fill #0
  Filled 2022-11-25: qty 60, 30d supply, fill #0

## 2022-11-25 MED ORDER — APIXABAN 5 MG PO TABS
5.0000 mg | ORAL_TABLET | Freq: Two times a day (BID) | ORAL | 3 refills | Status: DC
  Filled 2022-11-25: qty 60, 30d supply, fill #0

## 2022-11-25 NOTE — Progress Notes (Addendum)
Rounding Note    Patient Name: Chanay Earleen Newport Date of Encounter: 11/25/2022  [place] Cardiologist: Sanda Klein, MD   Subjective   No chest pain. Dyspnea with ambulation   Inpatient Medications    Scheduled Meds:  amLODipine  5 mg Oral Daily   apixaban  10 mg Oral BID   Followed by   Derrill Memo ON 12/01/2022] apixaban  5 mg Oral BID   aspirin EC  81 mg Oral Daily   carvedilol  12.5 mg Oral BID   cholestyramine  4 g Oral TID WC   donepezil  5 mg Oral QHS   fesoterodine  4 mg Oral Daily   insulin aspart  0-9 Units Subcutaneous TID WC   latanoprost  1 drop Both Eyes QHS   levothyroxine  75 mcg Oral Q0600   nitrofurantoin  100 mg Oral Daily   pantoprazole  80 mg Oral Daily   sodium chloride flush  3 mL Intravenous Q12H   sodium chloride flush  3 mL Intravenous Q12H   sodium chloride flush  3 mL Intravenous Q12H   Continuous Infusions:  sodium chloride     sodium chloride     nitroGLYCERIN Stopped (11/23/22 1444)   PRN Meds: sodium chloride, sodium chloride, acetaminophen, amitriptyline, nitroGLYCERIN, ondansetron (ZOFRAN) IV, sodium chloride flush, sodium chloride flush   Vital Signs    Vitals:   11/25/22 0440 11/25/22 0600 11/25/22 0800 11/25/22 0840  BP: 136/68   128/72  Pulse:   79 84  Resp: '20  18 17  '$ Temp: 98.1 F (36.7 C)   97.8 F (36.6 C)  TempSrc: Oral   Oral  SpO2:    97%  Weight:  78 kg    Height:       No intake or output data in the 24 hours ending 11/25/22 0849     11/25/2022    6:00 AM 11/22/2022    2:56 PM 08/17/2022   11:46 AM  Last 3 Weights  Weight (lbs) 171 lb 15.3 oz 176 lb 5.9 oz 177 lb  Weight (kg) 78 kg 80 kg 80.287 kg      Telemetry    Sinus- Personally Reviewed  ECG    No am tracing- Personally Reviewed  Physical Exam   General: Well developed, well nourished, NAD  HEENT: OP clear, mucus membranes moist  SKIN: warm, dry. No rashes. Neuro: No focal deficits  Musculoskeletal: Muscle strength 5/5  all ext  Psychiatric: Mood and affect normal  Neck: No JVD, no carotid bruits, no thyromegaly, no lymphadenopathy.  Lungs:Clear bilaterally, no wheezes, rhonci, crackles Cardiovascular: Regular rate and rhythm. No murmurs, gallops or rubs. Abdomen:Soft. Bowel sounds present. Non-tender.  Extremities: No lower extremity edema. Pulses are 2 + in the bilateral DP/PT.  Labs    High Sensitivity Troponin:   Recent Labs  Lab 11/22/22 1512 11/22/22 1730 11/23/22 0510  TROPONINIHS 199* 353* 295*     Chemistry Recent Labs  Lab 11/22/22 1512 11/22/22 1730 11/23/22 0510  NA 141  --  141  K 3.1*  --  3.7  CL 106  --  112*  CO2 23  --  22  GLUCOSE 108*  --  120*  BUN 13  --  10  CREATININE 0.92  --  0.95  CALCIUM 9.3  --  8.9  MG  --  1.9  --   GFRNONAA >60  --  >60  ANIONGAP 12  --  7    Lipids  Recent Labs  Lab 11/23/22 0510  CHOL 139  TRIG 67  HDL 74  LDLCALC 52  CHOLHDL 1.9    Hematology Recent Labs  Lab 11/23/22 0510 11/24/22 0025 11/25/22 0026  WBC 6.2 6.1 6.4  RBC 3.87 3.96 3.88  HGB 11.8* 11.9* 11.4*  HCT 34.3* 34.4* 34.2*  MCV 88.6 86.9 88.1  MCH 30.5 30.1 29.4  MCHC 34.4 34.6 33.3  RDW 14.6 14.4 14.5  PLT 323 325 307   Thyroid  Recent Labs  Lab 11/23/22 0510  TSH 1.322  FREET4 0.92    BNPNo results for input(s): "BNP", "PROBNP" in the last 168 hours.  DDimer  Recent Labs  Lab 11/24/22 0744  DDIMER 11.20*     Radiology    VAS [place] LOWER EXTREMITY VENOUS (DVT)  Result Date: 11/24/2022  Lower Venous DVT Study Patient Name:  ANDRIANA CASA Frogge  Date of Exam:   11/24/2022 Medical Rec #: [id number]             Accession #:    [id number] Date of Birth: [birth year]             Patient Gender: F Patient Age:   38 years Exam Location:  [facility] Procedure:      VAS [place] LOWER EXTREMITY VENOUS (DVT) Referring Phys: Ria Comment ROBERTS --------------------------------------------------------------------------------  Indications: Pulmonary embolism.   Comparison Study: 11-24-2022 CTA Chest with evidence of submassive PE w/ right                   heart strain. Performing Technologist: Darlin Coco RDMS, RVT  Examination Guidelines: A complete evaluation includes B-mode imaging, spectral Doppler, color Doppler, and power Doppler as needed of all accessible portions of each vessel. Bilateral testing is considered an integral part of a complete examination. Limited examinations for reoccurring indications may be performed as noted. The reflux portion of the exam is performed with the patient in reverse Trendelenburg.  +---------+---------------+---------+-----------+----------+-----------------+ RIGHT    CompressibilityPhasicitySpontaneityPropertiesThrombus Aging    +---------+---------------+---------+-----------+----------+-----------------+ CFV      Full           Yes      Yes                                    +---------+---------------+---------+-----------+----------+-----------------+ SFJ      Full                                                           +---------+---------------+---------+-----------+----------+-----------------+ FV Prox  Full                                                           +---------+---------------+---------+-----------+----------+-----------------+ FV Mid   Full                                                           +---------+---------------+---------+-----------+----------+-----------------+ FV DistalFull                                                           +---------+---------------+---------+-----------+----------+-----------------+  PFV      Full                                                           +---------+---------------+---------+-----------+----------+-----------------+ POP      Full           Yes      Yes                                    +---------+---------------+---------+-----------+----------+-----------------+ PTV      Partial        Yes       Yes                  Age Indeterminate +---------+---------------+---------+-----------+----------+-----------------+ PERO     Full                                                           +---------+---------------+---------+-----------+----------+-----------------+   +---------+---------------+---------+-----------+----------+-----------------+ LEFT     CompressibilityPhasicitySpontaneityPropertiesThrombus Aging    +---------+---------------+---------+-----------+----------+-----------------+ CFV      Full           Yes      Yes                                    +---------+---------------+---------+-----------+----------+-----------------+ SFJ      Full                                                           +---------+---------------+---------+-----------+----------+-----------------+ FV Prox  Full                                                           +---------+---------------+---------+-----------+----------+-----------------+ FV Mid   Full                                                           +---------+---------------+---------+-----------+----------+-----------------+ FV DistalFull                                                           +---------+---------------+---------+-----------+----------+-----------------+ PFV      Full                                                           +---------+---------------+---------+-----------+----------+-----------------+  POP      Full           Yes      Yes                                    +---------+---------------+---------+-----------+----------+-----------------+ PTV      Partial        Yes      Yes                  Age Indeterminate +---------+---------------+---------+-----------+----------+-----------------+ PERO     Full                                                           +---------+---------------+---------+-----------+----------+-----------------+      Summary: RIGHT: - Findings consistent with age indeterminate deep vein thrombosis involving the right posterior tibial veins. - No cystic structure found in the popliteal fossa.  LEFT: - Findings consistent with age indeterminate deep vein thrombosis involving the left posterior tibial veins. - No cystic structure found in the popliteal fossa.  *See table(s) above for measurements and observations. Electronically signed by Harold Barban MD on 11/24/2022 at 5:22:27 PM.    Final    CT Angio Chest Pulmonary Embolism (PE) W or WO Contrast  Result Date: 11/24/2022 CLINICAL DATA:  Pulmonary embolism suspected EXAM: CT ANGIOGRAPHY CHEST WITH CONTRAST TECHNIQUE: Multidetector CT imaging of the chest was performed using the standard protocol during bolus administration of intravenous contrast. Multiplanar CT image reconstructions and MIPs were obtained to evaluate the vascular anatomy. RADIATION DOSE REDUCTION: This exam was performed according to the departmental dose-optimization program which includes automated exposure control, adjustment of the mA and/or kV according to patient size and/or use of iterative reconstruction technique. CONTRAST:  50 cc Omnipaque 350 COMPARISON:  Chest radiograph 2 days prior, CT chest 08/02/2019 FINDINGS: Cardiovascular: There is adequate opacification of the pulmonary arteries to the segmental level. There are acute occlusive pulmonary emboli on the left at the lobar level extending to the segmental level and on the right at the segmental level. Overall small to moderate size clot burden. There is evidence of right heart strain with the right heart to left heart ratio of 1.04. The heart size is normal. There is no pericardial effusion. There is mild calcified plaque in the thoracic aorta. Mediastinum/Nodes: There is a 1.9 cm left thyroid nodule. The esophagus is grossly unremarkable. There is a small hiatal hernia. There is no mediastinal, hilar, or axillary lymphadenopathy.  Lungs/Pleura: The trachea and central airways are patent. There is mild background emphysema. Linear opacities in the lung bases likely reflect subsegmental atelectasis. There is no focal consolidation or pulmonary edema. There is no evidence of pulmonary infarct. There is a 6 mm subpleural nodule in the lingula (7-82) and a 3-4 mm subpleural nodule in the right lower lobe (7-83), unchanged since 2020 and requiring no specific imaging follow-up. There are no suspicious nodules. Upper Abdomen: The imaged portions of the upper abdominal viscera are unremarkable. Musculoskeletal: There is no acute osseous abnormality or suspicious osseous lesion. A left breast implant is noted. Review of the MIP images confirms the above findings. IMPRESSION: 1. Positive for acute PE with CT evidence of right heart strain (RV/LV Ratio = 1.04) consistent  with at least submassive (intermediate risk) PE. The presence of right heart strain has been associated with an increased risk of morbidity and mortality. Please refer to the "Code PE Focused" order set in EPIC. 2. Mild emphysema. Critical Value/emergent results were called by telephone at the time of interpretation on 11/24/2022 at 11:14 am to provider Dr Angelena Form, who verbally acknowledged these results. Electronically Signed   By: Valetta Mole M.D.   On: 11/24/2022 11:15   CARDIAC CATHETERIZATION  Result Date: 11/23/2022 Patent left main with no significant stenosis Patent LAD with mild non-obstructive plaque at the first diagonal Patent, dominant LCx with mild nonobstructive plaque in the proximal and mid-vessel Nondominant RCA without significant stenosis Normal LVEDP Recommend: medical therapy for nonobstructive CAD. Consider non-cardiac symptoms.   ECHOCARDIOGRAM COMPLETE  Result Date: 11/23/2022    ECHOCARDIOGRAM REPORT   Patient Name:   HERLINDA HEADY Graefe Date of Exam: 11/23/2022 Medical Rec #:  [id number]            Height:       65.0 in Accession #:    [id number]            Weight:       176.4 lb Date of Birth:  Feb 15, 1941            BSA:          1.875 m Patient Age:    69 years             BP:           124/79 mmHg Patient Gender: F                    HR:           80 bpm. Exam Location:  Inpatient Procedure: 2D Echo, Cardiac Doppler and Color Doppler Indications:    NSTEMI 121.4  History:        Patient has prior history of Echocardiogram examinations, most                 recent 12/02/2014. CAD, Previous Myocardial Infarction and Angina,                 TIA, Arrythmias:PVC; Risk Factors:Hypertension, Diabetes and                 Dyslipidemia. Breast Cancer.  Sonographer:    Ronny Flurry Referring Phys: 8 [place]  1. Left ventricular ejection fraction, by estimation, is 60 to 65%. The left ventricle has normal function. The left ventricle has no regional wall motion abnormalities. There is moderate asymmetric left ventricular hypertrophy of the basal-septal segment. Left ventricular diastolic parameters are consistent with Grade I diastolic dysfunction (impaired relaxation).  2. Right ventricular systolic function is normal. The right ventricular size is normal.  3. The mitral valve is grossly normal. Trivial mitral valve regurgitation. No evidence of mitral stenosis.  4. The aortic valve is tricuspid. There is mild calcification of the aortic valve. There is mild thickening of the aortic valve. Aortic valve regurgitation is not visualized. Aortic valve sclerosis/calcification is present, without any evidence of aortic stenosis.  5. The inferior vena cava is dilated in size with <50% respiratory variability, suggesting right atrial pressure of 15 mmHg. Comparison(s): Compared to prior TTE report in 2016, the LVEF appears more robust (previously reported as 50-55%). Otherwise, there is no significant change. FINDINGS  Left Ventricle: Left ventricular ejection fraction, by estimation, is 60 to 65%. The left ventricle  has normal function. The left  ventricle has no regional wall motion abnormalities. The left ventricular internal cavity size was normal in size. There is  moderate asymmetric left ventricular hypertrophy of the basal-septal segment. Left ventricular diastolic parameters are consistent with Grade I diastolic dysfunction (impaired relaxation). Right Ventricle: The right ventricular size is normal. No increase in right ventricular wall thickness. Right ventricular systolic function is normal. Left Atrium: Left atrial size was normal in size. Right Atrium: Right atrial size was normal in size. Pericardium: There is no evidence of pericardial effusion. Mitral Valve: The mitral valve is grossly normal. There is mild thickening of the mitral valve leaflet(s). There is mild calcification of the mitral valve leaflet(s). Trivial mitral valve regurgitation. No evidence of mitral valve stenosis. Tricuspid Valve: The tricuspid valve is normal in structure. Tricuspid valve regurgitation is mild. Aortic Valve: The aortic valve is tricuspid. There is mild calcification of the aortic valve. There is mild thickening of the aortic valve. Aortic valve regurgitation is not visualized. Aortic valve sclerosis/calcification is present, without any evidence of aortic stenosis. Aortic valve mean gradient measures 2.0 mmHg. Aortic valve peak gradient measures 4.0 mmHg. Aortic valve area, by VTI measures 2.48 cm. Pulmonic Valve: The pulmonic valve was not well visualized. Aorta: The aortic root is normal in size and structure. Venous: The inferior vena cava is dilated in size with less than 50% respiratory variability, suggesting right atrial pressure of 15 mmHg. IAS/Shunts: The atrial septum is grossly normal.  LEFT VENTRICLE PLAX 2D LVIDd:         3.60 cm     Diastology LVIDs:         2.70 cm     LV e' medial:    6.39 cm/s LV PW:         1.10 cm     LV E/e' medial:  8.6 LV IVS:        0.90 cm     LV e' lateral:   8.59 cm/s LVOT diam:     1.85 cm     LV E/e' lateral: 6.4  LV SV:         49 LV SV Index:   26 LVOT Area:     2.69 cm  LV Volumes (MOD) LV vol d, MOD A2C: 47.1 ml LV vol d, MOD A4C: 74.5 ml LV vol s, MOD A2C: 15.5 ml LV vol s, MOD A4C: 19.5 ml LV SV MOD A2C:     31.6 ml LV SV MOD A4C:     74.5 ml LV SV MOD BP:      44.4 ml RIGHT VENTRICLE            IVC RV S prime:     6.16 cm/s  IVC diam: 2.30 cm TAPSE (M-mode): 1.4 cm LEFT ATRIUM           Index       RIGHT ATRIUM          Index LA diam:      2.10 cm 1.12 cm/m  RA Area:     5.79 cm LA Vol (A2C): 10.1 ml 5.39 ml/m  RA Volume:   10.20 ml 5.44 ml/m LA Vol (A4C): 16.7 ml 8.91 ml/m  AORTIC VALVE AV Area (Vmax):    2.54 cm AV Area (Vmean):   2.44 cm AV Area (VTI):     2.48 cm AV Vmax:           100.00 cm/s AV Vmean:  67.300 cm/s AV VTI:            0.199 m AV Peak Grad:      4.0 mmHg AV Mean Grad:      2.0 mmHg LVOT Vmax:         94.40 cm/s LVOT Vmean:        61.200 cm/s LVOT VTI:          0.184 m LVOT/AV VTI ratio: 0.92  AORTA Ao Root diam: 2.60 cm MITRAL VALVE               TRICUSPID VALVE MV Area (PHT): 3.54 cm    TV Peak grad:   17.1 mmHg MV Decel Time: 214 msec    TV Vmax:        2.07 m/s MV E velocity: 55.15 cm/s  TR Peak grad:   14.4 mmHg MV A velocity: 71.65 cm/s  TR Vmax:        190.00 cm/s MV E/A ratio:  0.77                            SHUNTS                            Systemic VTI:  0.18 m                            Systemic Diam: 1.85 cm Gwyndolyn Kaufman MD Electronically signed by Gwyndolyn Kaufman MD Signature Date/Time: 11/23/2022/1:25:56 PM    Final     Cardiac Studies   See above  Patient Profile     81 y.o. female with history of CAD, HTN, HLD admitted with chest pain and found to have elevated troponin. Cardiac cath 11/23/22 with mild non-obstructive disease. Chest CTA with PE.   Assessment & Plan    Non-obstructive CAD/Elevated troponin/Acute PE: Cardiac cath with mild non-obstructive disease. Echo with normal LV function with no wall motion abnormalities. D-dimer was checked on  the morning of 11/24/22 and was elevated at 11. Chest CTA with evidence of PE. LE dopplers with bilateral DVT of uncertain chronicity.  She has been started on Eliquis 10 mg po BID for one week to be changed to 5 mg po BID after one week. Continue ASA and beta blocker.  She is statin intolerant. We should consider Repatha as an outpatient.   Discharge home today. Follow up one week with Dr. Sallyanne Kuster or office APP  For questions or updates, please contact [place] Please consult [email or web address] for contact info under       Signed, Lauree Chandler, MD  11/25/2022, 8:49 AM

## 2022-11-25 NOTE — Progress Notes (Signed)
Mobility Specialist Progress Note   11/25/22 0915  Mobility  Activity Ambulated with assistance in hallway;Dangled on edge of bed  Level of Assistance Standby assist, set-up cues, supervision of patient - no hands on  Assistive Device None  Distance Ambulated (ft) 350 ft  Range of Motion/Exercises Active;All extremities  Activity Response Tolerated well   Pre Mobility:  HR 86, SpO2 99% RA During Mobility: HR 116, SpO2 96% RA Post Mobility: HR 90, SpO2 100% RA   Patient received dangling EOB and agreeable to participate in mobility. Ambulated min guard to supervision with slow steady gait. Returned to room without complaint or incident. Was left dangling with all needs met, call bell in reach.   Of note, patient mentioned she has had multiple falls in the past 35-month. Denies dizziness or lightheadedness during those occurrences.   JMartiniqueTripp, BS EXP Mobility Specialist Please contact via SecureChat or Rehab office at [phone]

## 2022-11-25 NOTE — Care Management Important Message (Signed)
Important Message  Patient Details  Name: Kelly Kennedy MRN: [id number] Date of Birth: 11-Aug-1941   Medicare Important Message Given:  Yes     Ahmon Tosi [address] 11/25/2022, 1:42 PM

## 2022-11-25 NOTE — Discharge Summary (Addendum)
Discharge Summary    Patient ID: Kelly Kennedy MRN: [id number]; DOB: [birth year]  Admit date: 11/22/2022 Discharge date: 11/25/2022  PCP:  [name], MD   [place] Providers Cardiologist:  Sanda Klein, MD      Discharge Diagnoses    Principal Problem:   NSTEMI (non-ST elevated myocardial infarction) [facility]) Active Problems:   Diabetes mellitus without complication ([place])   ? History of TIA (transient ischemic attack)   HLD (hyperlipidemia)   Coronary artery disease   Pulmonary embolism (HCC)   DVT (deep venous thrombosis) (HCC)    Diagnostic Studies/Procedures    Left Heart Catheterization 11/23/22 Patent left main with no significant stenosis Patent LAD with mild non-obstructive plaque at the first diagonal Patent, dominant LCx with mild nonobstructive plaque in the proximal and mid-vessel Nondominant RCA without significant stenosis Normal LVEDP   Recommend: medical therapy for nonobstructive CAD. Consider non-cardiac symptoms.  Diagnostic Dominance: Left   Echocardiogram 11/23/22  1. Left ventricular ejection fraction, by estimation, is 60 to 65%. The  left ventricle has normal function. The left ventricle has no regional  wall motion abnormalities. There is moderate asymmetric left ventricular  hypertrophy of the basal-septal  segment. Left ventricular diastolic parameters are consistent with Grade I  diastolic dysfunction (impaired relaxation).   2. Right ventricular systolic function is normal. The right ventricular  size is normal.   3. The mitral valve is grossly normal. Trivial mitral valve  regurgitation. No evidence of mitral stenosis.   4. The aortic valve is tricuspid. There is mild calcification of the  aortic valve. There is mild thickening of the aortic valve. Aortic valve  regurgitation is not visualized. Aortic valve sclerosis/calcification is  present, without any evidence of  aortic stenosis.   5. The inferior  vena cava is dilated in size with <50% respiratory  variability, suggesting right atrial pressure of 15 mmHg.   Comparison(s): Compared to prior TTE report in 2016, the LVEF appears more  robust (previously reported as 50-55%). Otherwise, there is no significant  change.   _____________   History of Present Illness     Kelly Kennedy is a 81 y.o. female with history of CAD, hypertension, hyperlipidemia, prior stroke, type 2 diabetes, hypothyroidism, history of breast cancer who presented to the ED on 12/26 complaining of chest pain and dyspnea.  Patient is followed by Dr. Sallyanne Kuster as an outpatient.  She was last seen by cardiology in 05/2022 by Coletta Memos, NP for follow-up of chest pain.  At that time, patient reported having intermittent episodes of chest pain that were relieved by nitroglycerin and rest.  This was consistent with stable angina.  BP was well-controlled at that time.  Recommended CT coronary, but this study was never performed.  CT angiogram of the aorta was also ordered, but was never scheduled or performed.  It was noted that patient had prior cardiac catheterization in 2019 that showed a distal 45% circumflex lesion but no other significant findings.  No intervention was performed at that time.  ED, patient complained of chest pain and shortness of breath that started about 30 minutes prior to arrival.  EMS was called, patient was given a nitroglycerin and 3 aspirin.  Noted that BP was significantly elevated and was reported to be 262/120.  Initial troponin came back elevated to 199, second troponin came back at 353.  She was started on IV heparin.  Chest x-ray showed no acute abnormalities.  EKG showed sinus tachycardia with inferior  and lateral T wave inversions.  Hospital Course     Consultants: None  Nonobstructive CAD Elevated troponin - Patient presented to the ED on 12/26 complaining of chest pain, sob.  - hsTn [phone] - Underwent LHC on 12/27 that showed  mild, non-obstructive disease  - Echocardiogram this admission with normal LV function, no wall motion abnormalities  - Continue ASA 81 mg daily, carvedilol 12.5 mg BID  - Patient is intolerant of statins. On Repatha   Acute PE Bilateral DVT  - CT chest on 12/28 showed an acute PE with evidence of right heart strain, consistent with at least submassive PE - LE dopplers on 12/28 showed bilateral DVTs of uncertain chronicity  - Patient was started on Eliquis 10 mg BID, will remain on this dose for a total of 1 week. After 1 week, patient will be treated with eliquis 5 mg BID -- pharmacy gave patient a starter pack at DC   HTN  - BP well controlled on current regiment   Did the patient have an acute coronary syndrome (MI, NSTEMI, STEMI, etc) this admission?:  No                               Did the patient have a percutaneous coronary intervention (stent / angioplasty)?:  No.    Patient was seen and examined by Dr. Angelena Form and deemed stable for discharge.   Patient has a follow up appointment on 1/5     _____________  Discharge Vitals Blood pressure (!) 168/87, pulse 80, temperature 97.6 F (36.4 C), temperature source Oral, resp. rate 14, height _0  (1.651 m), weight 78 kg, SpO2 100 %.  Filed Weights   11/22/22 1456 11/25/22 0600  Weight: 80 kg 78 kg    Labs & Radiologic Studies    CBC Recent Labs    11/24/22 0025 11/25/22 0026  WBC 6.1 6.4  HGB 11.9* 11.4*  HCT 34.4* 34.2*  MCV 86.9 88.1  PLT 325 203   Basic Metabolic Panel Recent Labs    11/22/22 1512 11/22/22 1730 11/23/22 0510  NA 141  --  141  K 3.1*  --  3.7  CL 106  --  112*  CO2 23  --  22  GLUCOSE 108*  --  120*  BUN 13  --  10  CREATININE 0.92  --  0.95  CALCIUM 9.3  --  8.9  MG  --  1.9  --    Liver Function Tests No results for input(s): "AST", "ALT", "ALKPHOS", "BILITOT", "PROT", "ALBUMIN" in the last 72 hours. No results for input(s): "LIPASE", "AMYLASE" in the last 72 hours. High  Sensitivity Troponin:   Recent Labs  Lab 11/22/22 1512 11/22/22 1730 11/23/22 0510  TROPONINIHS 199* 353* 295*    BNP Invalid input(s): "POCBNP" D-Dimer Recent Labs    11/24/22 0744  DDIMER 11.20*   Hemoglobin A1C Recent Labs    11/23/22 0510  HGBA1C 6.1*   Fasting Lipid Panel Recent Labs    11/23/22 0510  CHOL 139  HDL 74  LDLCALC 52  TRIG 67  CHOLHDL 1.9   Thyroid Function Tests Recent Labs    11/23/22 0510  TSH 1.322   _____________  ECHOCARDIOGRAM COMPLETE  Result Date: 11/25/2022    ECHOCARDIOGRAM REPORT   Patient Name:   JAHNE KRUKOWSKI Cerra Date of Exam: 11/23/2022 Medical Rec #:  [id number]  Height:       65.0 in Accession #:    [id number]           Weight:       176.4 lb Date of Birth:  1941/08/09            BSA:          1.875 m Patient Age:    71 years             BP:           124/79 mmHg Patient Gender: F                    HR:           80 bpm. Exam Location:  Inpatient Procedure: 2D Echo, Cardiac Doppler and Color Doppler Indications:     NSTEMI 121.4  History:         Patient has prior history of Echocardiogram examinations, most                  recent 12/02/2014. CAD, Previous Myocardial Infarction and                  Angina, TIA, Arrythmias:PVC; Risk Factors:Hypertension,                  Diabetes and Dyslipidemia. Breast Cancer.  Sonographer:     Ronny Flurry Referring Phys:  [id number] [place] Diagnosing Phys: Gwyndolyn Kaufman MD IMPRESSIONS  1. Left ventricular ejection fraction, by estimation, is 60 to 65%. The left ventricle has normal function. The left ventricle has no regional wall motion abnormalities. There is moderate asymmetric left ventricular hypertrophy of the basal-septal segment. Left ventricular diastolic parameters are consistent with Grade I diastolic dysfunction (impaired relaxation).  2. Right ventricular systolic function is normal. The right ventricular size is normal.  3. The mitral valve is grossly normal. Trivial  mitral valve regurgitation. No evidence of mitral stenosis.  4. The aortic valve is tricuspid. There is mild calcification of the aortic valve. There is mild thickening of the aortic valve. Aortic valve regurgitation is not visualized. Aortic valve sclerosis/calcification is present, without any evidence of aortic stenosis.  5. The inferior vena cava is dilated in size with <50% respiratory variability, suggesting right atrial pressure of 15 mmHg. Comparison(s): Compared to prior TTE report in 2016, the LVEF appears more robust (previously reported as 50-55%). Otherwise, there is no significant change. FINDINGS  Left Ventricle: Left ventricular ejection fraction, by estimation, is 60 to 65%. The left ventricle has normal function. The left ventricle has no regional wall motion abnormalities. Definity contrast agent was given IV to delineate the left ventricular  endocardial borders. The left ventricular internal cavity size was normal in size. There is moderate asymmetric left ventricular hypertrophy of the basal-septal segment. Left ventricular diastolic parameters are consistent with Grade I diastolic dysfunction (impaired relaxation). Right Ventricle: The right ventricular size is normal. No increase in right ventricular wall thickness. Right ventricular systolic function is normal. Left Atrium: Left atrial size was normal in size. Right Atrium: Right atrial size was normal in size. Pericardium: There is no evidence of pericardial effusion. Mitral Valve: The mitral valve is grossly normal. There is mild thickening of the mitral valve leaflet(s). There is mild calcification of the mitral valve leaflet(s). Trivial mitral valve regurgitation. No evidence of mitral valve stenosis. Tricuspid Valve: The tricuspid valve is normal in structure. Tricuspid valve regurgitation is mild. Aortic Valve: The aortic  valve is tricuspid. There is mild calcification of the aortic valve. There is mild thickening of the aortic valve.  Aortic valve regurgitation is not visualized. Aortic valve sclerosis/calcification is present, without any evidence of aortic stenosis. Aortic valve mean gradient measures 2.0 mmHg. Aortic valve peak gradient measures 4.0 mmHg. Aortic valve area, by VTI measures 2.48 cm. Pulmonic Valve: The pulmonic valve was not well visualized. Aorta: The aortic root is normal in size and structure. Venous: The inferior vena cava is dilated in size with less than 50% respiratory variability, suggesting right atrial pressure of 15 mmHg. IAS/Shunts: The atrial septum is grossly normal.  LEFT VENTRICLE PLAX 2D LVIDd:         3.60 cm     Diastology LVIDs:         2.70 cm     LV e' medial:    6.39 cm/s LV PW:         1.10 cm     LV E/e' medial:  8.6 LV IVS:        0.90 cm     LV e' lateral:   8.59 cm/s LVOT diam:     1.85 cm     LV E/e' lateral: 6.4 LV SV:         49 LV SV Index:   26 LVOT Area:     2.69 cm  LV Volumes (MOD) LV vol d, MOD A2C: 47.1 ml LV vol d, MOD A4C: 74.5 ml LV vol s, MOD A2C: 15.5 ml LV vol s, MOD A4C: 19.5 ml LV SV MOD A2C:     31.6 ml LV SV MOD A4C:     74.5 ml LV SV MOD BP:      44.4 ml RIGHT VENTRICLE            IVC RV S prime:     6.16 cm/s  IVC diam: 2.30 cm TAPSE (M-mode): 1.4 cm LEFT ATRIUM           Index       RIGHT ATRIUM          Index LA diam:      2.10 cm 1.12 cm/m  RA Area:     5.79 cm LA Vol (A2C): 10.1 ml 5.39 ml/m  RA Volume:   10.20 ml 5.44 ml/m LA Vol (A4C): 16.7 ml 8.91 ml/m  AORTIC VALVE AV Area (Vmax):    2.54 cm AV Area (Vmean):   2.44 cm AV Area (VTI):     2.48 cm AV Vmax:           100.00 cm/s AV Vmean:          67.300 cm/s AV VTI:            0.199 m AV Peak Grad:      4.0 mmHg AV Mean Grad:      2.0 mmHg LVOT Vmax:         94.40 cm/s LVOT Vmean:        61.200 cm/s LVOT VTI:          0.184 m LVOT/AV VTI ratio: 0.92  AORTA Ao Root diam: 2.60 cm MITRAL VALVE               TRICUSPID VALVE MV Area (PHT): 3.54 cm    TV Peak grad:   17.1 mmHg MV Decel Time: 214 msec    TV Vmax:         2.07 m/s MV E velocity: 55.15 cm/s  TR  Peak grad:   14.4 mmHg MV A velocity: 71.65 cm/s  TR Vmax:        190.00 cm/s MV E/A ratio:  0.77                            SHUNTS                            Systemic VTI:  0.18 m                            Systemic Diam: 1.85 cm Gwyndolyn Kaufman MD Electronically signed by Gwyndolyn Kaufman MD Signature Date/Time: 11/23/2022/1:25:56 PM    Final (Updated)    VAS [place] LOWER EXTREMITY VENOUS (DVT)  Result Date: 11/24/2022  Lower Venous DVT Study Patient Name:  ITSEL OPFER Stairs  Date of Exam:   11/24/2022 Medical Rec #: [id number]             Accession #:    [id number] Date of Birth: 1941-10-31             Patient Gender: F Patient Age:   3 years Exam Location:  [facility] Procedure:      VAS [place] LOWER EXTREMITY VENOUS (DVT) Referring Phys: Ria Comment ROBERTS --------------------------------------------------------------------------------  Indications: Pulmonary embolism.  Comparison Study: 11-24-2022 CTA Chest with evidence of submassive PE w/ right                   heart strain. Performing Technologist: Darlin Coco RDMS, RVT  Examination Guidelines: A complete evaluation includes B-mode imaging, spectral Doppler, color Doppler, and power Doppler as needed of all accessible portions of each vessel. Bilateral testing is considered an integral part of a complete examination. Limited examinations for reoccurring indications may be performed as noted. The reflux portion of the exam is performed with the patient in reverse Trendelenburg.  +---------+---------------+---------+-----------+----------+-----------------+ RIGHT    CompressibilityPhasicitySpontaneityPropertiesThrombus Aging    +---------+---------------+---------+-----------+----------+-----------------+ CFV      Full           Yes      Yes                                    +---------+---------------+---------+-----------+----------+-----------------+ SFJ      Full                                                            +---------+---------------+---------+-----------+----------+-----------------+ FV Prox  Full                                                           +---------+---------------+---------+-----------+----------+-----------------+ FV Mid   Full                                                           +---------+---------------+---------+-----------+----------+-----------------+  FV DistalFull                                                           +---------+---------------+---------+-----------+----------+-----------------+ PFV      Full                                                           +---------+---------------+---------+-----------+----------+-----------------+ POP      Full           Yes      Yes                                    +---------+---------------+---------+-----------+----------+-----------------+ PTV      Partial        Yes      Yes                  Age Indeterminate +---------+---------------+---------+-----------+----------+-----------------+ PERO     Full                                                           +---------+---------------+---------+-----------+----------+-----------------+   +---------+---------------+---------+-----------+----------+-----------------+ LEFT     CompressibilityPhasicitySpontaneityPropertiesThrombus Aging    +---------+---------------+---------+-----------+----------+-----------------+ CFV      Full           Yes      Yes                                    +---------+---------------+---------+-----------+----------+-----------------+ SFJ      Full                                                           +---------+---------------+---------+-----------+----------+-----------------+ FV Prox  Full                                                           +---------+---------------+---------+-----------+----------+-----------------+ FV Mid   Full                                                            +---------+---------------+---------+-----------+----------+-----------------+ FV DistalFull                                                           +---------+---------------+---------+-----------+----------+-----------------+  PFV      Full                                                           +---------+---------------+---------+-----------+----------+-----------------+ POP      Full           Yes      Yes                                    +---------+---------------+---------+-----------+----------+-----------------+ PTV      Partial        Yes      Yes                  Age Indeterminate +---------+---------------+---------+-----------+----------+-----------------+ PERO     Full                                                           +---------+---------------+---------+-----------+----------+-----------------+     Summary: RIGHT: - Findings consistent with age indeterminate deep vein thrombosis involving the right posterior tibial veins. - No cystic structure found in the popliteal fossa.  LEFT: - Findings consistent with age indeterminate deep vein thrombosis involving the left posterior tibial veins. - No cystic structure found in the popliteal fossa.  *See table(s) above for measurements and observations. Electronically signed by Harold Barban MD on 11/24/2022 at 5:22:27 PM.    Final    CT Angio Chest Pulmonary Embolism (PE) W or WO Contrast  Result Date: 11/24/2022 CLINICAL DATA:  Pulmonary embolism suspected EXAM: CT ANGIOGRAPHY CHEST WITH CONTRAST TECHNIQUE: Multidetector CT imaging of the chest was performed using the standard protocol during bolus administration of intravenous contrast. Multiplanar CT image reconstructions and MIPs were obtained to evaluate the vascular anatomy. RADIATION DOSE REDUCTION: This exam was performed according to the departmental dose-optimization program which includes  automated exposure control, adjustment of the mA and/or kV according to patient size and/or use of iterative reconstruction technique. CONTRAST:  50 cc Omnipaque 350 COMPARISON:  Chest radiograph 2 days prior, CT chest 08/02/2019 FINDINGS: Cardiovascular: There is adequate opacification of the pulmonary arteries to the segmental level. There are acute occlusive pulmonary emboli on the left at the lobar level extending to the segmental level and on the right at the segmental level. Overall small to moderate size clot burden. There is evidence of right heart strain with the right heart to left heart ratio of 1.04. The heart size is normal. There is no pericardial effusion. There is mild calcified plaque in the thoracic aorta. Mediastinum/Nodes: There is a 1.9 cm left thyroid nodule. The esophagus is grossly unremarkable. There is a small hiatal hernia. There is no mediastinal, hilar, or axillary lymphadenopathy. Lungs/Pleura: The trachea and central airways are patent. There is mild background emphysema. Linear opacities in the lung bases likely reflect subsegmental atelectasis. There is no focal consolidation or pulmonary edema. There is no evidence of pulmonary infarct. There is a 6 mm subpleural nodule in the lingula (7-82) and a 3-4 mm subpleural nodule in the right lower lobe (7-83), unchanged since 2020 and requiring  no specific imaging follow-up. There are no suspicious nodules. Upper Abdomen: The imaged portions of the upper abdominal viscera are unremarkable. Musculoskeletal: There is no acute osseous abnormality or suspicious osseous lesion. A left breast implant is noted. Review of the MIP images confirms the above findings. IMPRESSION: 1. Positive for acute PE with CT evidence of right heart strain (RV/LV Ratio = 1.04) consistent with at least submassive (intermediate risk) PE. The presence of right heart strain has been associated with an increased risk of morbidity and mortality. Please refer to the  "Code PE Focused" order set in EPIC. 2. Mild emphysema. Critical Value/emergent results were called by telephone at the time of interpretation on 11/24/2022 at 11:14 am to provider Dr Angelena Form, who verbally acknowledged these results. Electronically Signed   By: Valetta Mole M.D.   On: 11/24/2022 11:15   CARDIAC CATHETERIZATION  Result Date: 11/23/2022 Patent left main with no significant stenosis Patent LAD with mild non-obstructive plaque at the first diagonal Patent, dominant LCx with mild nonobstructive plaque in the proximal and mid-vessel Nondominant RCA without significant stenosis Normal LVEDP Recommend: medical therapy for nonobstructive CAD. Consider non-cardiac symptoms.   DG Chest 2 View  Result Date: 11/22/2022 CLINICAL DATA:  Chest pain and shortness of breath for 30 minutes EXAM: CHEST - 2 VIEW COMPARISON:  08/07/2019 FINDINGS: Normal heart size, mediastinal contours, and pulmonary vascularity. Atherosclerotic calcification aorta. Lungs clear. No acute infiltrate, pleural effusion, or pneumothorax. Osseous demineralization. IMPRESSION: No acute abnormalities. Aortic Atherosclerosis (ICD10-I70.0). Electronically Signed   By: Lavonia Dana M.D.   On: 11/22/2022 15:57   Disposition   Pt is being discharged home today in good condition.  Follow-up Plans & Appointments     Follow-up Information     Deberah Pelton, NP Follow up on 12/02/2022.   Specialty: Cardiology Why: Appointment at 2:45 PM Contact information: [address] [place] [address] [phone]                Discharge Instructions     AMB Referral to [facility]   Complete by: As directed    Hospital Follow up referral/Care Coordination:  NSTEMI  Primary Care Provider: [name], MD, [place] [facility] plan: [facility]  *Follow up attempted on Food Insecurity question, no answer to call, please follow up  Please assign to [place] for post hospital care coordination for readmission prevention follow up calls and assess for further needs.  Questions please call:   Natividad Brood, RN BSN [facility]  [phone] business mobile phone Toll free office [phone]  *[place]  [phone] Fax number: [phone] [email or web address]   Reason for Referral: Care Coordination ([facility] patients)   Disease managment services needed: Nurse Case Manager   Diagnoses of:  Diabetes Other     Other Diagnosis: NSTEMI   Expected date of contact: Emergent - 3 Days   AMB referral to Phase II Cardiac Rehabilitation   Complete by: As directed    Diagnosis: NSTEMI   After initial evaluation and assessments completed: Virtual Based Care may be provided alone or in conjunction with Phase 2 Cardiac Rehab based on patient barriers.: Yes   Intensive Cardiac Rehabilitation (ICR) [place] location only OR Traditional Cardiac Rehabilitation (TCR) *If criteria for ICR are not met will enroll in TCR [facility] only): Yes   Call MD for:  difficulty breathing, headache or visual disturbances   Complete by:  As directed    Call MD for:  persistant dizziness or light-headedness   Complete by: As directed    Call MD for:  persistant nausea and vomiting   Complete by: As directed    Call MD for:  severe uncontrolled pain   Complete by: As directed    Diet - low sodium heart healthy   Complete by: As directed    Increase activity slowly   Complete by: As directed         Discharge Medications   Allergies as of 11/25/2022       Reactions   Ace Inhibitors Swelling   H/o lip swelling with change in losartan pill   Angiotensin Receptor Blockers Swelling   H/o lip swelling with change in losartan pill   Statins Anaphylaxis   myalgias   Flonase [fluticasone Propionate] Other (See Comments)   Intolerant of smell   Glipizide Other  (See Comments)   unstready when on med.     Iron Other (See Comments)   Bloating with oral iron   Sulfa Antibiotics Other (See Comments)   Intolerant of sulfa but able to tolerate trimethoprim   Niacin Rash   Red Dye Rash        Medication List     TAKE these medications    amitriptyline 50 MG tablet Commonly known as: ELAVIL TAKE 1-2 TABLETS (50-100 MG TOTAL) BY MOUTH AT BEDTIME AS NEEDED. FOR SLEEP What changed: reasons to take this   amLODipine 5 MG tablet Commonly known as: NORVASC Take 5 mg by mouth daily.   apixaban 5 MG Tabs tablet Commonly known as: ELIQUIS Take 2 tablets (10 mg total) twice daily from 12/29-1/3. Then, take 1 tablet (5 mg total) twice daily until instructed to stop.   apixaban 5 MG Tabs tablet Commonly known as: ELIQUIS Take 1 tablet (5 mg total) by mouth 2 (two) times daily. Begin taking after finishing your starter pack Start taking on: December 01, 2022   aspirin EC 81 MG tablet Take 1 tablet (81 mg total) by mouth daily. Swallow whole.   carvedilol 12.5 MG tablet Commonly known as: COREG TAKE 1 TABLET BY MOUTH TWICE A DAY   cholestyramine 4 g packet Commonly known as: QUESTRAN TAKE 1 PACKET (4 G TOTAL) BY MOUTH 3 (THREE) TIMES DAILY WITH MEALS.   cyanocobalamin 1000 MCG/ML injection Commonly known as: VITAMIN B12 Inject 1 mL (1,000 mcg total) into the muscle every 30 (thirty) days.   diclofenac Sodium 1 % Gel Commonly known as: VOLTAREN APPLY 2 G TOPICALLY 4 (FOUR) TIMES DAILY AS NEEDED. What changed: reasons to take this   donepezil 5 MG disintegrating tablet Commonly known as: ARICEPT ODT TAKE 1 TABLET BY MOUTH EVERYDAY AT BEDTIME What changed: See the new instructions.   fluocinonide cream 0.05 % Commonly known as: LIDEX APPLY TOPICALLY DAILY AS NEEDED. USE SPARINGLY.   isosorbide mononitrate 30 MG 24 hr tablet Commonly known as: IMDUR TAKE 1 TABLET BY MOUTH EVERY DAY   levothyroxine 75 MCG tablet Commonly known as:  SYNTHROID TAKE 1 TABLET BY MOUTH EVERY DAY BEFORE BREAKFAST What changed: See the new instructions.   Lumigan 0.01 % Soln Generic drug: bimatoprost Place 1 drop into both eyes at bedtime.   nitrofurantoin 100 MG capsule Commonly known as: MACRODANTIN TAKE 1 CAPSULE BY MOUTH EVERY DAY What changed: how much to take   nitroGLYCERIN 0.4 MG SL tablet Commonly known as: NITROSTAT PLACE 1 TABLET UNDER THE TONGUE EVERY 5 MINUTES AS  NEEDED FOR CHEST PAIN. What changed: See the new instructions.   omeprazole 40 MG capsule Commonly known as: PRILOSEC Take 1 capsule (40 mg total) by mouth daily.   Repatha SureClick 794 MG/ML Soaj Generic drug: Evolocumab ADMINISTER 1 ML UNDER THE SKIN EVERY 14 DAYS   solifenacin 5 MG tablet Commonly known as: VESICARE TAKE 1 TABLET (5 MG TOTAL) BY MOUTH DAILY.   Xiidra 5 % Soln Generic drug: Lifitegrast Apply 1 drop to eye in the morning and at bedtime.           Outstanding Labs/Studies     Duration of Discharge Encounter   Greater than 30 minutes including physician time.  Signed, Margie Billet, PA-C 11/25/2022, 11:34 AM   I have personally seen and examined this patient. I agree with the assessment and plan as outlined above.  She is doing well this am. See my full note this am. Discharge home today.   Lauree Chandler, MD, [facility] 11/25/2022 11:45 AM

## 2022-11-25 NOTE — Social Work (Signed)
CSW acknowledges SDOH screen flag for food insecurity. CSW met with pt and granddaughter at bedside, concerns confirmed by both. Pt states that she knows she needs to eat healthier, but affording appropriate foods is difficult. She also discussed that she needs some guidance as to which foods to eat as well. CSW noted resources for food banks and food resources will be added to AVS. SNAP application provided to family. CSW to consult medical team for appropriate diet for pt. Pt has good family support to help her with her diet constraints.

## 2022-11-29 ENCOUNTER — Telehealth: Payer: Self-pay | Admitting: *Deleted

## 2022-11-29 DIAGNOSIS — I214 Non-ST elevation (NSTEMI) myocardial infarction: Secondary | ICD-10-CM

## 2022-11-29 NOTE — Patient Outreach (Signed)
  Care Coordination [facility] Note Transition Care Management Follow-up Telephone Call Date of discharge and from where: [facility] [phone] How have you been since you were released from the hospital? Doing better but she has some confusion at time. Her appetite is poor. Sometimes she is not consistent with taking her medications as per ordered.  Any questions or concerns? Yes Can we get a care giver ordered. Patient daughter will talk with Dr Phillip Heal  Items Reviewed: Did the pt receive and understand the discharge instructions provided? Yes  Medications obtained and verified? Yes  Other? Yes Per daughter the mom has a bill box to take her medications three times a day, but sometimes she takes all of them at one time.  Any new allergies since your discharge? No  Dietary orders reviewed? Yes Do you have support at home? Yes   Home Care and Equipment/Supplies: Were home health services ordered? no If so, what is the name of the agency? [name]  Has the agency set up a time to come to the patient's home? not applicable Were any new equipment or medical supplies ordered?  No What is the name of the medical supply agency? [name] Were you able to get the supplies/equipment? not applicable Do you have any questions related to the use of the equipment or supplies? No  Functional Questionnaire: (I = Independent and D = Dependent) ADLs: I  Bathing/Dressing- I  Meal Prep- D  Eating- I  Maintaining continence- I  Transferring/Ambulation- I  Managing Meds- I  Follow up appointments reviewed:  PCP Hospital f/u appt confirmed? Yes  Dr Damita Dunnings [phone] 4:00 Specialist [facility] f/u appt confirmed? Yes  Coletta Memos NP [phone] 2:45. Are transportation arrangements needed? No  If their condition worsens, is the pt aware to call PCP or go to the Emergency Dept.? Yes Was the patient provided with contact information for the PCP's office or ED? Yes Was to pt encouraged to call back with questions or concerns?  Yes  SDOH assessments and interventions completed:   Yes SDOH Interventions Today    Flowsheet Row Most Recent Value  SDOH Interventions   Food Insecurity Interventions Other (Comment)  [intervention started in hosp. Patient had dual SNP and is  getting the additional money from [facility] that the daughter state she is using for the patient food. RN ordered the [facility] meal plan for post discharge.]  Housing Interventions Intervention Not Indicated  Transportation Interventions Intervention Not Indicated       Care Coordination Interventions:  Referred for Care Coordination Services:  RN Care Coordinator Quinn Plowman [phone] 3:30. RN referred for [facility] post discharge meals to be ordered. RN referred to upstream for medication adherence    Encounter Outcome:  Pt. Visit Completed    [facility] [phone]

## 2022-11-30 ENCOUNTER — Ambulatory Visit: Payer: Medicare Other

## 2022-11-30 ENCOUNTER — Telehealth: Payer: Self-pay

## 2022-11-30 NOTE — Progress Notes (Signed)
[facility])  [place] Team   Reason for referral: Medication Review  [facility] pharmacy referral is being closed due to the following reasons:  -Patient was outreached on 11/30/22.  The patient's daughter had questions about the dosing for her Eliquis. She reports the patient sleeps at odd hours, such as morning dose around noon and evening dose around midnight. I explained how a twice daily dosing schedule indicates the doses should be 12 hours apart. The patient's daughter verbalized understanding and prefers to relay the information to the patient herself, since she keeps up with her medications, and reports the patient has some dementia. She had no further medication questions or concerns.  [facility] is happy to assist the patient/family in the future for clinical pharmacy needs, following a discussion from your team about [facility] outreach. Thank you for allowing [facility] to be a part of your patient's care.    Thanks,  Reed Breech, [place] [phone]

## 2022-11-30 NOTE — Telephone Encounter (Signed)
   Telephone encounter was:  Unsuccessful.  11/30/2022 Name: Chelli Yerkes MRN: [id number] DOB: 05-02-1941  Unsuccessful outbound call made today to assist with:  Food Insecurity  Outreach Attempt:  1st Attempt  A HIPAA compliant voice message was left requesting a return call.  Instructed patient to call back.    [place], Care Management  [phone] [address], [place], [place] [address] Phone: [phone] Email: [email or web address]

## 2022-12-01 ENCOUNTER — Encounter: Payer: Self-pay | Admitting: Family Medicine

## 2022-12-01 ENCOUNTER — Ambulatory Visit (INDEPENDENT_AMBULATORY_CARE_PROVIDER_SITE_OTHER): Payer: Medicare Other | Admitting: Family Medicine

## 2022-12-01 VITALS — BP 130/78 | HR 79 | Temp 97.6°F | Ht 65.0 in | Wt 179.0 lb

## 2022-12-01 DIAGNOSIS — I2699 Other pulmonary embolism without acute cor pulmonale: Secondary | ICD-10-CM

## 2022-12-01 DIAGNOSIS — E538 Deficiency of other specified B group vitamins: Secondary | ICD-10-CM

## 2022-12-01 DIAGNOSIS — R413 Other amnesia: Secondary | ICD-10-CM

## 2022-12-01 MED ORDER — CARVEDILOL 12.5 MG PO TABS: 12.5000 mg | ORAL_TABLET | Freq: Two times a day (BID) | ORAL | 1 refills | Status: DC

## 2022-12-01 MED ORDER — CYANOCOBALAMIN 1000 MCG/ML IJ SOLN
1000.0000 ug | Freq: Once | INTRAMUSCULAR | Status: AC
  Administered 2022-12-01: 1000 ug via INTRAMUSCULAR

## 2022-12-01 NOTE — Patient Instructions (Addendum)
Please call [phone] about a fall button.  Please see what options that have.   Use your cane.  I'll update cardiology.  Take care.  Glad to see you. Let me know if you need more eliquis.   B12 shot today.

## 2022-12-01 NOTE — Progress Notes (Signed)
Admit date: 11/22/2022 Discharge date: 11/25/2022   PCP:  [name], MD              [facility] Providers Cardiologist:  Sanda Klein, MD        Discharge Diagnoses    Principal Problem:   NSTEMI (non-ST elevated myocardial infarction) [facility]) Active Problems:   Diabetes mellitus without complication ([place])   ? History of TIA (transient ischemic attack)   HLD (hyperlipidemia)   Coronary artery disease   Pulmonary embolism (HCC)   DVT (deep venous thrombosis) (HCC)       Diagnostic Studies/Procedures    Left Heart Catheterization 11/23/22 Patent left main with no significant stenosis Patent LAD with mild non-obstructive plaque at the first diagonal Patent, dominant LCx with mild nonobstructive plaque in the proximal and mid-vessel Nondominant RCA without significant stenosis Normal LVEDP   Recommend: medical therapy for nonobstructive CAD. Consider non-cardiac symptoms.  Diagnostic Dominance: Left    Echocardiogram 11/23/22  1. Left ventricular ejection fraction, by estimation, is 60 to 65%. The  left ventricle has normal function. The left ventricle has no regional  wall motion abnormalities. There is moderate asymmetric left ventricular  hypertrophy of the basal-septal  segment. Left ventricular diastolic parameters are consistent with Grade I  diastolic dysfunction (impaired relaxation).   2. Right ventricular systolic function is normal. The right ventricular  size is normal.   3. The mitral valve is grossly normal. Trivial mitral valve  regurgitation. No evidence of mitral stenosis.   4. The aortic valve is tricuspid. There is mild calcification of the  aortic valve. There is mild thickening of the aortic valve. Aortic valve  regurgitation is not visualized. Aortic valve sclerosis/calcification is  present, without any evidence of  aortic stenosis.   5. The inferior vena cava is dilated in size with <50% respiratory  variability, suggesting  right atrial pressure of 15 mmHg.   Comparison(s): Compared to prior TTE report in 2016, the LVEF appears more  robust (previously reported as 50-55%). Otherwise, there is no significant  change.    _____________   History of Present Illness     Kelly Kennedy is a 82 y.o. female with history of CAD, hypertension, hyperlipidemia, prior stroke, type 2 diabetes, hypothyroidism, history of breast cancer who presented to the ED on 12/26 complaining of chest pain and dyspnea.  Patient is followed by Dr. Sallyanne Kuster as an outpatient.  She was last seen by cardiology in 05/2022 by Coletta Memos, NP for follow-up of chest pain.  At that time, patient reported having intermittent episodes of chest pain that were relieved by nitroglycerin and rest.  This was consistent with stable angina.  BP was well-controlled at that time.  Recommended CT coronary, but this study was never performed.  CT angiogram of the aorta was also ordered, but was never scheduled or performed.  It was noted that patient had prior cardiac catheterization in 2019 that showed a distal 45% circumflex lesion but no other significant findings.  No intervention was performed at that time.   ED, patient complained of chest pain and shortness of breath that started about 30 minutes prior to arrival.  EMS was called, patient was given a nitroglycerin and 3 aspirin.  Noted that BP was significantly elevated and was reported to be 262/120.  Initial troponin came back elevated to 199, second troponin came back at 353.  She was started on IV heparin.  Chest x-ray showed no acute abnormalities.  EKG showed  sinus tachycardia with inferior and lateral T wave inversions.   Hospital Course     Consultants: None   Nonobstructive CAD Elevated troponin - Patient presented to the ED on 12/26 complaining of chest pain, sob.  - hsTn [phone] - Underwent LHC on 12/27 that showed mild, non-obstructive disease  - Echocardiogram this admission with  normal LV function, no wall motion abnormalities  - Continue ASA 81 mg daily, carvedilol 12.5 mg BID  - Patient is intolerant of statins. On Repatha    Acute PE Bilateral DVT  - CT chest on 12/28 showed an acute PE with evidence of right heart strain, consistent with at least submassive PE - LE dopplers on 12/28 showed bilateral DVTs of uncertain chronicity  - Patient was started on Eliquis 10 mg BID, will remain on this dose for a total of 1 week. After 1 week, patient will be treated with eliquis 5 mg BID -- pharmacy gave patient a starter pack at DC    HTN  - BP well controlled on current regiment    ====================== Inpatient course discussed with patient.  She was admitted with chest pain and found to have nonobstructive coronary disease but pulmonary emboli noted.  No known active cancer. She has been off tamoxifen for >1 year.  She is not on any medications that would significantly increase her risk for thrombotic disease.  Rationale for treatment with anticoagulation discussed with patient and family.  Compliant.  No bleeding.  Fall cautions discussed with patient.  She clearly feels better than she did upon admission to the hospital.  Due for B12 shot.  Done at office visit.  She needs a new rx for coreg- her old rx was mixed (6.25 vs 12.'5mg'$ ) at home.  New prescription sent.  Discussed with patient and family about recheck memory when she has not been acutely ill/hospitalized recently.  Meds, vitals, and allergies reviewed.   ROS: Per HPI unless specifically indicated in ROS section   GEN: nad, alert and pleasant in conversation. HEENT: ncat NECK: supple w/o LA CV: rrr PULM: ctab, no inc wob ABD: soft, +bs EXT: Trace BLE edema SKIN: no acute rash  Labs pending regarding anticoagulation.  30 minutes were devoted to patient care in this encounter (this includes time spent reviewing the patient's file/history, interviewing and examining the patient, counseling/reviewing  plan with patient).

## 2022-12-01 NOTE — Progress Notes (Signed)
Cardiology Clinic Note   Patient Name: Kelly Kennedy Date of Encounter: 12/02/2022  Primary Care Provider:  [name], MD Primary Cardiologist:  Sanda Klein, MD  Patient Profile    Kelly Kennedy 82 year old female presents the clinic today for follow-up evaluation of her  coronary artery disease.  Past Medical History    Past Medical History:  Diagnosis Date   adenomatous Colon polyps    colonoscopy 8/99, 12/02, 8/06   Arthritis    Bell's palsy    Cancer (HCC)    DCIS left breast   Cataract    Chest pain    uses NTG as needed   Depression    Diabetes mellitus without complication ([place])    Diverticulosis of colon 06/2005   Ductal carcinoma in situ (DCIS) of left breast    Family history of adverse reaction to anesthesia    " my mother didn't wake up so they had to put her on life support for about an hour or more; my sisiter has problems waking up too from anesthesia.   GERD (gastroesophageal reflux disease)    Glaucoma    H/O hiatal hernia    Headache(784.0)    Hyperglycemia    Hyperlipidemia    Hypertension    Hypothyroid    Insomnia    Irritable bowel    Menopausal symptoms    Pericarditis 1980's   Stroke [facility])    TIA (transient ischemic attack)    09/2014   Past Surgical History:  Procedure Laterality Date   APPENDECTOMY     BREAST EXCISIONAL BIOPSY Right    benign   BREAST RECONSTRUCTION WITH PLACEMENT OF TISSUE EXPANDER AND FLEX HD (ACELLULAR HYDRATED DERMIS) Left 08/06/2015   Procedure: LEFT BREAST RECONSTRUCTION WITH PLACEMENT OF SALINE IMPLANT AND ACELLULAR DURMAL MATRIX;  Surgeon: Crissie Reese, MD;  Location: [place];  Service: Plastics;  Laterality: Left;   CARDIAC CATHETERIZATION     CATARACT EXTRACTION Bilateral 2016   CHOLECYSTECTOMY  1990   COLONOSCOPY     CYSTECTOMY  05/04/2008   left lower arm, benign   ECTOPIC PREGNANCY SURGERY  1971   FRACTURE SURGERY     nose   LEFT HEART CATH AND CORONARY ANGIOGRAPHY [name]  09/21/2018   Procedure: LEFT HEART CATH AND CORONARY ANGIOGRAPHY;  Surgeon: Leonie Man, MD;  Location: [place] [facility];  Service: Cardiovascular;  Laterality: [name];   LEFT HEART CATH AND CORONARY ANGIOGRAPHY [name] 11/23/2022   Procedure: LEFT HEART CATH AND CORONARY ANGIOGRAPHY;  Surgeon: Sherren Mocha, MD;  Location: [place] [facility];  Service: Cardiovascular;  Laterality: [name];   MASTECTOMY Left 2016   NIPPLE SPARING MASTECTOMY Left 08/06/2015   Procedure: LEFT NIPPLE SPARING MASTECTOMY;  Surgeon: [name], MD;  Location: [place];  Service: General;  Laterality: Left;   ORIF ANKLE FRACTURE Left 10/23/2013   Procedure: OPEN REDUCTION INTERNAL FIXATION (ORIF) LEFT ANKLE FRACTURE;  Surgeon: Marianna Payment, MD;  Location: [place];  Service: Orthopedics;  Laterality: Left;   PARTIAL HYSTERECTOMY     ovaries intact   PARTIAL MASTECTOMY WITH NEEDLE LOCALIZATION AND AXILLARY SENTINEL LYMPH NODE BX Left 05/06/2015   Procedure: LEFT BREAST PARTIAL MASTECTOMY WITH NEEDLE LOCALIZATION AND SENTINEL LYMPH NODE MAPPING;  Surgeon: [name], MD;  Location: [place];  Service: General;  Laterality: Left;   TONSILLECTOMY     TUBAL LIGATION      Allergies  Allergies  Allergen Reactions   Ace Inhibitors Swelling  H/o lip swelling with change in losartan pill   Angiotensin Receptor Blockers Swelling    H/o lip swelling with change in losartan pill   Statins Anaphylaxis    myalgias   Flonase [Fluticasone Propionate] Other (See Comments)    Intolerant of smell   Glipizide Other (See Comments)    unstready when on med.     Iron Other (See Comments)    Bloating with oral iron   Sulfa Antibiotics Other (See Comments)    Intolerant of sulfa but able to tolerate trimethoprim   Niacin Rash   Red Dye Rash    History of Present Illness    Kelly Kennedy has a PMH of essential hypertension, coronary artery disease, PVCs, GERD, fatty liver, dysphagia,  hypothyroidism, obesity, anxiety, and hyperlipidemia.  Her PMH also includes nonsustained atrial tachycardia, previous TIA, and probable previous pericarditis.  Coronary CT 10/19 showed high-grade stenosis in her mid left circumflex with FFR of 0.75.  This showed a calcium score of 274.  Cardiac catheterization did not confirm the presence of single severe stenosis of circumflex artery.  She was noted to have 45% stenosed distal circumflex lesion.  LVEF was 65%.  She was seen by Dr. Sallyanne Kuster on 10/18/2021.  She reported taking occasional sublingual nitroglycerin for unpredictable episodes of chest tightness.  The nitroglycerin helps her symptoms resolve however, it takes about 15 minutes before the discomfort to go away.  She notices discomfort with doing housework like mopping or vacuuming.  She notes that her diastolic blood pressure at home is oftentimes in the 40s.  Her blood pressure in the office during her visit was 140/80.  She denied chest pain and dizziness.  She presented to the clinic 06/02/22 for follow-up evaluation stated she had not noticed any chest discomfort while doing increased physical activity.  She did note occasional chest discomfort after eating.   She did use occasional sublingual nitroglycerin which helps her chest discomfort dissipate after about 15-20 minutes.   Her daughter presented with her and indicated that she ate fried food regularly.  We reviewed her previous coronary CTA and cholesterol.  Her cholesterol was fairly well controlled with an LDL cholesterol of 57 on 10/04/2021.  We reviewed the importance of high-fiber diet.  I ordered a coronary CTA, have her increase her physical activity as tolerated, and plan follow-up for 6 months.  Coronary CTA was not completed.  She presented to the emergency department on 11/22/2022 and was complaining of dyspnea and chest discomfort.  She received nitroglycerin and 3 aspirin.  Her blood pressure was noted to be significantly elevated  at 262/120.  Her initial troponin came back elevated at 199 and on recheck was 353.  She was started on IV heparin.  Her chest x-ray showed no acute abnormalities.  Her EKG showed sinus tachycardia with inferior and lateral T wave inversion.  She underwent repeat cardiac catheterization 11/23/2022 which showed patent LAD with mild nonobstructive plaque, dominant circumflex with mild nonobstructive plaque and nondominant RCA without significant stenosis.  Medical management was recommended.  She presents to the clinic today for follow-up evaluation states she feels well today.  She denies chest pain.  She has not been very active since the catheterization.  We reviewed her medications and her DVT as well as PE.  She reports compliance with her apixaban.  We reviewed the medication.  She and her daughter expressed understanding.  Her blood pressure initially today is 181/91 and on recheck is 136/66.  We reviewed  her angiography results and they expressed understanding.  I will have her gradually increase her physical activity, continue her current medications and diet and we will plan follow-up in 3 to 4 months.  Today she denies chest pain, shortness of breath, lower extremity edema, fatigue, palpitations, melena, hematuria, hemoptysis, diaphoresis, weakness, presyncope, syncope, orthopnea, and PND.   Home Medications    Prior to Admission medications   Medication Sig Start Date End Date Taking? Authorizing Provider  amitriptyline (ELAVIL) 50 MG tablet Take 1-2 tablets (50-100 mg total) by mouth at bedtime as needed. for sleep 02/07/22   [name], MD  amLODipine (NORVASC) 5 MG tablet Take 5 mg by mouth daily. 04/24/21   [provider]  aspirin EC 81 MG tablet Take 1 tablet (81 mg total) by mouth daily. Swallow whole. 02/12/21   Croitoru, Mihai, MD  carvedilol (COREG) 12.5 MG tablet TAKE 1 TABLET BY MOUTH 2 TIMES DAILY. 02/14/22   Croitoru, Mihai, MD  cyanocobalamin (,VITAMIN B-12,) 1000  MCG/ML injection Inject 1 mL (1,000 mcg total) into the muscle every 30 (thirty) days. 07/07/21   [name], MD  diclofenac Sodium (VOLTAREN) 1 % GEL Apply 2 g topically 4 (four) times daily as needed. 10/04/21   [name], MD  donepezil (ARICEPT ODT) 5 MG disintegrating tablet TAKE 1 TABLET BY MOUTH EVERYDAY AT BEDTIME 03/16/22   [name], MD  fluocinonide cream (LIDEX) 0.05 % APPLY TOPICALLY DAILY AS NEEDED. USE SPARINGLY. 02/13/22   [name], MD  isosorbide mononitrate (IMDUR) 30 MG 24 hr tablet TAKE 1 TABLET BY MOUTH EVERY DAY 02/22/22   Croitoru, Mihai, MD  levothyroxine (SYNTHROID) 75 MCG tablet TAKE 1 TABLET BY MOUTH DAILY BEFORE BREAKFAST. 12/24/21   [name], MD  Lifitegrast Shirley Friar) 5 % SOLN Apply 1 drop to eye in the morning and at bedtime. Twice eyes    [provider]  LUMIGAN 0.01 % SOLN Place 1 drop into both eyes at bedtime. 06/29/19   [provider]  nitroGLYCERIN (NITROSTAT) 0.4 MG SL tablet Place 1 tablet (0.4 mg total) under the tongue every 5 (five) minutes as needed for chest pain. 02/12/21   Croitoru, Mihai, MD  omeprazole (PRILOSEC) 40 MG capsule TAKE 1 CAPSULE (40 MG TOTAL) BY MOUTH DAILY. 02/14/22   Ladene Artist, MD  REPATHA SURECLICK 500 MG/ML SOAJ ADMINISTER 1 ML UNDER THE SKIN EVERY 14 DAYS 09/14/21   Croitoru, Dani Gobble, MD  solifenacin (VESICARE) 5 MG tablet Take 1 tablet (5 mg total) by mouth daily. 10/04/21   [name], MD    Family History    Family History  Problem Relation Age of Onset   Cancer Father        stomach    Stomach cancer Father    Colon cancer Father    Heart disease Son        MI   Arthritis Mother    Diabetes Mother    Cancer Brother        lung cancer   Pancreatic cancer Neg Hx    Breast cancer Neg Hx    Esophageal cancer Neg Hx    Rectal cancer Neg Hx    She indicated that her mother is deceased. She indicated that her father is deceased. She indicated that her brother is  deceased. She indicated that her son is deceased. She indicated that the status of her neg hx is unknown.  Social History    Social History  Socioeconomic History   Marital status: Widowed    Spouse name: Not on file   Number of children: 3   Years of education: Not on file   Highest education level: Not on file  Occupational History   Occupation: retired, previous Engineer, manufacturing systems  Tobacco Use   Smoking status: Former    Types: Cigarettes    Quit date: 04/03/2017    Years since quitting: 5.6   Smokeless tobacco: Former   Tobacco comments:    [place].  Vaping Use   Vaping Use: Never used  Substance and Sexual Activity   Alcohol use: No    Alcohol/week: 0.0 standard drinks of alcohol   Drug use: No   Sexual activity: Never  Other Topics Concern   Not on file  Social History Narrative   From [place].   3 children, one died from MI in 02/21/05   Married 02/22/1959   Husband and brother died in the same week in 11/23/12      Patient is right handed.   Patient has hs education.   Patient drinks 1 cup of soda daily, and green tea.   Social Determinants of Health   Financial Resource Strain: Low Risk  (02/08/2022)   Overall Financial Resource Strain (CARDIA)    Difficulty of Paying Living Expenses: Not hard at all  Food Insecurity: Food Insecurity Present (11/29/2022)   Hunger Vital Sign    Worried About Running Out of Food in the Last Year: Sometimes true    Ran Out of Food in the Last Year: Sometimes true  Transportation Needs: No Transportation Needs (11/29/2022)   PRAPARE - Hydrologist (Medical): No    Lack of Transportation (Non-Medical): No  Physical Activity: Inactive (02/08/2022)   Exercise Vital Sign    Days of Exercise per Week: 0 days    Minutes of Exercise per Session: 0 min  Stress: No Stress Concern Present (02/08/2022)   [place]    Feeling of  Stress : Only a little  Social Connections: Moderately Isolated (02/08/2022)   Social Connection and Isolation Panel [NHANES]    Frequency of Communication with Friends and Family: More than three times a week    Frequency of Social Gatherings with Friends and Family: More than three times a week    Attends Religious Services: 1 to 4 times per year    Active Member of [employer] or Organizations: No    Attends Archivist Meetings: Never    Marital Status: Widowed  Intimate Partner Violence: Not At Risk (02/08/2022)   Humiliation, Afraid, Rape, and Kick questionnaire    Fear of Current or Ex-Partner: No    Emotionally Abused: No    Physically Abused: No    Sexually Abused: No     Review of Systems    General:  No chills, fever, night sweats or weight changes.  Cardiovascular:  No chest pain, dyspnea on exertion, edema, orthopnea, palpitations, paroxysmal nocturnal dyspnea. Dermatological: No rash, lesions/masses Respiratory: No cough, dyspnea Urologic: No hematuria, dysuria Abdominal:   No nausea, vomiting, diarrhea, bright red blood per rectum, melena, or hematemesis Neurologic:  No visual changes, wkns, changes in mental status. All other systems reviewed and are otherwise negative except as noted above.  Physical Exam    VS:  BP 136/66   Pulse 87   Wt 179 lb 12.8 oz (81.6 kg)   SpO2 99%  BMI 29.92 kg/m  , BMI Body mass index is 29.92 kg/m. GEN: Well nourished, well developed, in no acute distress. HEENT: normal. Neck: Supple, no JVD, carotid bruits, or masses. Cardiac: RRR, no murmurs, rubs, or gallops. No clubbing, cyanosis, generalized bilateral lower extremity nonpitting edema.  Radials/DP/PT 2+ and equal bilaterally.  Respiratory:  Respirations regular and unlabored, clear to auscultation bilaterally. GI: Soft, nontender, nondistended, BS + x 4. MS: no deformity or atrophy. Skin: warm and dry, no rash. Neuro:  Strength and sensation are intact. Psych: Normal  affect.  Accessory Clinical Findings    Recent Labs: 11/22/2022: Magnesium 1.9 11/23/2022: TSH 1.322 12/01/2022: ALT 11; BUN 8; Creatinine, Ser 0.82; Hemoglobin 11.5; Platelets 372.0; Potassium 3.8; Sodium 141   Recent Lipid Panel    Component Value Date/Time   CHOL 139 11/23/2022 0510   CHOL 121 10/30/2019 0915   TRIG 67 11/23/2022 0510   TRIG 203 10/03/2012 0000   HDL 74 11/23/2022 0510   HDL 72 10/30/2019 0915   CHOLHDL 1.9 11/23/2022 0510   VLDL 13 11/23/2022 0510   LDLCALC 52 11/23/2022 0510   LDLCALC 26 10/30/2019 0915   LDLDIRECT 154.0 10/27/2017 0756    ECG personally reviewed by me today-none today.  EKG 06/02/2022 normal sinus rhythm LVH possible lateral infarct undetermined age 27 bpm- No acute changes  Cardiac catheterization 09/21/2018 Dist Cx lesion is 45% stenosed -angiographically moderate at best disease. Does not explain CT scan. There is hyperdynamic left ventricular systolic function. The left ventricular ejection fraction is greater than 65% by visual estimate. LV end diastolic pressure is normal.   Mild to moderate disease distal dominant Circumflex.  There is no culprit lesion to explain CT scan findings. Otherwise essentially normal coronary arteries and a left dominant system. Normal LVEF with hyperdynamic function.  The patient had a brief run of PAT/SVT during injection.   Patient will return to short stay holding area for ongoing care. Would recommend beta-blocker.  The patient felt palpitations with PVCs and brief runs of PAT with left ventricular hemodynamics and LV gram.  She describes her chest discomfort as the symptoms noted with documented ectopy and brief PAT runs.   She will be ready for discharge home after bedrest.   Recommend Aspirin '81mg'$  daily for moderate CAD.      Glenetta Hew, MD  Diagnostic Dominance: Left  Intervention    Left Heart Catheterization 11/23/22 Patent left main with no significant stenosis Patent LAD  with mild non-obstructive plaque at the first diagonal Patent, dominant LCx with mild nonobstructive plaque in the proximal and mid-vessel Nondominant RCA without significant stenosis Normal LVEDP   Recommend: medical therapy for nonobstructive CAD. Consider non-cardiac symptoms.  Diagnostic Dominance: Left    Echocardiogram 11/23/22  1. Left ventricular ejection fraction, by estimation, is 60 to 65%. The  left ventricle has normal function. The left ventricle has no regional  wall motion abnormalities. There is moderate asymmetric left ventricular  hypertrophy of the basal-septal  segment. Left ventricular diastolic parameters are consistent with Grade I  diastolic dysfunction (impaired relaxation).   2. Right ventricular systolic function is normal. The right ventricular  size is normal.   3. The mitral valve is grossly normal. Trivial mitral valve  regurgitation. No evidence of mitral stenosis.   4. The aortic valve is tricuspid. There is mild calcification of the  aortic valve. There is mild thickening of the aortic valve. Aortic valve  regurgitation is not visualized. Aortic valve sclerosis/calcification is  present, without  any evidence of  aortic stenosis.   5. The inferior vena cava is dilated in size with <50% respiratory  variability, suggesting right atrial pressure of 15 mmHg.   Comparison(s): Compared to prior TTE report in 2016, the LVEF appears more  robust (previously reported as 50-55%). Otherwise, there is no significant  change.   Assessment & Plan   1.  Coronary artery disease-no chest pain today.  Denies recent episodes of chest discomfort.  Underwent cardiac catheterization 10/2722 and was noted to have mild nonobstructive plaque.  Medical management was recommended Continue amlodipine, aspirin, carvedilol, Imdur, nitroglycerin, Repatha Heart healthy low-sodium diet Increase physical activity gradually   Hyperlipidemia-11/23/2022: Cholesterol 139; HDL  74; LDL Cholesterol 52; Triglycerides 67; VLDL 13 Continue Repatha, aspirin Heart healthy low-sodium high-fiber diet Increase physical activity gradually  Essential hypertension-BP today 136/66.  Well-controlled at home.   Continue amlodipine, carvedilol, Imdur Heart healthy low-sodium diet-salty 6 given Increase physical activity gradually  Type 2 diabetes-glucose 120 on 11/23/22 Heart healthy low-sodium carb modified diet Follows with PCP  Disposition: Follow-up with Dr. Sallyanne Kuster or me in 3-4 months.     Jossie Ng. Manual Navarra NP-C     12/02/2022, 2:57 PM [facility] [address] Office [phone] Fax [phone]  Notice: This dictation was prepared with Dragon dictation along with smaller phrase technology. Any transcriptional errors that result from this process are unintentional and may not be corrected upon review.  I spent 14 minutes examining this patient, reviewing medications, and using patient centered shared decision making involving her cardiac care.  Prior to her visit I spent greater than 20 minutes reviewing her past medical history,  medications, and prior cardiac tests.

## 2022-12-02 ENCOUNTER — Encounter: Payer: Self-pay | Admitting: General Practice

## 2022-12-02 ENCOUNTER — Telehealth: Payer: Self-pay

## 2022-12-02 ENCOUNTER — Ambulatory Visit: Payer: Medicare Other | Attending: General Practice | Admitting: General Practice

## 2022-12-02 VITALS — BP 136/66 | HR 87 | Wt 179.8 lb

## 2022-12-02 DIAGNOSIS — E7800 Pure hypercholesterolemia, unspecified: Secondary | ICD-10-CM | POA: Diagnosis not present

## 2022-12-02 DIAGNOSIS — I25118 Atherosclerotic heart disease of native coronary artery with other forms of angina pectoris: Secondary | ICD-10-CM

## 2022-12-02 DIAGNOSIS — E119 Type 2 diabetes mellitus without complications: Secondary | ICD-10-CM | POA: Diagnosis not present

## 2022-12-02 DIAGNOSIS — I10 Essential (primary) hypertension: Secondary | ICD-10-CM

## 2022-12-02 LAB — COMPREHENSIVE METABOLIC PANEL
ALT: 11 U/L (ref 0–35)
AST: 17 U/L (ref 0–37)
Albumin: 3.6 g/dL (ref 3.5–5.2)
Alkaline Phosphatase: 45 U/L (ref 39–117)
BUN: 8 mg/dL (ref 6–23)
CO2: 27 mEq/L (ref 19–32)
Calcium: 8.8 mg/dL (ref 8.4–10.5)
Chloride: 106 mEq/L (ref 96–112)
Creatinine, Ser: 0.82 mg/dL (ref 0.40–1.20)
GFR: 67.37 mL/min (ref >60.00)
Glucose, Bld: 85 mg/dL (ref 70–99)
Potassium: 3.8 mEq/L (ref 3.5–5.1)
Sodium: 141 mEq/L (ref 135–145)
Total Bilirubin: 0.3 mg/dL (ref 0.2–1.2)
Total Protein: 6.1 g/dL (ref 6.0–8.3)

## 2022-12-02 LAB — CBC WITH DIFFERENTIAL/PLATELET
Basophils Absolute: 0 10*3/uL (ref 0.0–0.1)
Basophils Relative: 0.4 % (ref 0.0–3.0)
Eosinophils Absolute: 0 10*3/uL (ref 0.0–0.7)
Eosinophils Relative: 0.8 % (ref 0.0–5.0)
HCT: 33.1 % — ABNORMAL LOW (ref 36.0–46.0)
Hemoglobin: 11.5 g/dL — ABNORMAL LOW (ref 12.0–15.0)
Lymphocytes Relative: 50.6 % — ABNORMAL HIGH (ref 12.0–46.0)
Lymphs Abs: 2.5 10*3/uL (ref 0.7–4.0)
MCHC: 34.6 g/dL (ref 30.0–36.0)
MCV: 87.4 fl (ref 78.0–100.0)
Monocytes Absolute: 0.4 10*3/uL (ref 0.1–1.0)
Monocytes Relative: 8.9 % (ref 3.0–12.0)
Neutro Abs: 1.9 10*3/uL (ref 1.4–7.7)
Neutrophils Relative %: 39.3 % — ABNORMAL LOW (ref 43.0–77.0)
Platelets: 372 10*3/uL (ref 150.0–400.0)
RBC: 3.79 Mil/uL — ABNORMAL LOW (ref 3.87–5.11)
RDW: 14.5 % (ref 11.5–15.5)
WBC: 4.9 10*3/uL (ref 4.0–10.5)

## 2022-12-02 NOTE — Telephone Encounter (Signed)
   Telephone encounter was:  Successful.  12/02/2022 Name: Kelly Kennedy MRN: [id number] DOB: 12/16/1940  Kelly Kennedy is a 82 y.o. year old female who is a primary care patient of [name], MD . The community resource team was consulted for assistance with [place] guide performed the following interventions: Patient provided with information about care guide support team and interviewed to confirm resource needs.Patient is requesting to be put on the wait list for Meals on Wheels. Referral added to [facility]  Follow Up Plan:  No further follow up planned at this time. The patient has been provided with needed resources.    [facility], Care Management  [phone] [address], [place],  [address] Phone: [phone] Email: [email or web address]

## 2022-12-02 NOTE — Patient Instructions (Signed)
Medication Instructions:  The current medical regimen is effective;  continue present plan and medications as directed. Please refer to the Current Medication list given to you today.  *If you need a refill on your cardiac medications before your next appointment, please call your pharmacy*  Lab Work: NONE If you have labs (blood work) drawn today and your tests are completely normal, you will receive your results only by:  [place] (if you have MyChart) OR A paper copy in the mail  If you have any lab test that is abnormal or we need to change your treatment, we will call you to review the results.  Testing/Procedures: NONE  Follow-Up: At [facility] you and your health needs are our priority.  As part of our continuing mission to provide you with exceptional heart care, we have created designated Provider Care Teams.  These Care Teams include your primary Cardiologist (physician) and Advanced Practice Providers (APPs -  Physician Assistants and Nurse Practitioners) who all work together to provide you with the care you need, when you need it.  We recommend signing up for the patient portal called "MyChart".  Sign up information is provided on this After Visit Summary.  MyChart is used to connect with patients for Virtual Visits (Telemedicine).  Patients are able to view lab/test results, encounter notes, upcoming appointments, etc.  Non-urgent messages can be sent to your provider as well.   To learn more about what you can do with MyChart, go to [email or web address].    Your next appointment:   3-4 month(s)  The format for your next appointment:   In Person  Provider:   Sanda Klein, MD  or Coletta Memos, FNP       Other Instructions INCREASE PHYSICAL ACTIVITY AS TOLERATED  CONTINUE TO TAKE AND LOG YOUR BLOOD PRESSURE  EAT SOONER  Important Information About Sugar

## 2022-12-04 ENCOUNTER — Telehealth: Payer: Self-pay | Admitting: Family Medicine

## 2022-12-04 NOTE — Assessment & Plan Note (Signed)
Done with initial starter pack of Eliquis.  Transition to routine 5 mg twice a day.  Fall cautions discussed with patient.  At this point benefit of anticoagulation appears to outweigh the risk.  At this point I think it makes sense to plan for at least 3 months of anticoagulation unless she had significant bleeding in the meantime.  I think it would be reasonable to plan for a full 6 months of anticoagulation, if tolerated.  If she is having recurrent falls or bleeding then we would need to consider shortening the duration.

## 2022-12-04 NOTE — Assessment & Plan Note (Signed)
Discussed her situation.  She wants to stay at home.  We talked about getting a fall alert button.  She has family nearby.  My point is not to tell her what to do in terms of her living situation but to find out what she wants and then try to arrange that as safely as possible.  She wants to be at home and I encouraged her to talk with her family about what extra potential help she would need at home.  I gave him the phone number to get a fall alert button

## 2022-12-04 NOTE — Assessment & Plan Note (Signed)
B12 injection done at office visit.  Continue with routine replacement.

## 2022-12-04 NOTE — Telephone Encounter (Addendum)
Please update patient and family.  See result note.  At this point I think it makes sense to plan for at least 3 months of anticoagulation unless she had significant bleeding in the meantime.  I think it would be reasonable to plan for a full 6 months of anticoagulation, if tolerated and if not having episodes with bleeding.  If she is having recurrent falls or bleeding then we would need to consider shortening the duration.  Let me know if she needs a refill.  Thanks.

## 2022-12-05 ENCOUNTER — Ambulatory Visit: Payer: Self-pay

## 2022-12-05 NOTE — Telephone Encounter (Signed)
Called patient reviewed all information and repeated back to me. Will call if any questions.  Have scheduled next b 12 with Daughter (on [place]). They do not need refill at this time.

## 2022-12-05 NOTE — Patient Instructions (Signed)
Visit Information  Thank you for taking time to visit with me today. Please don't hesitate to contact me if I can be of assistance to you.   Following are the goals we discussed today:   Goals Addressed             This Visit's Progress    Develop plan of care for management of coronary artery disease       Care Coordination Interventions: Evaluation of current treatment plan related to coronary artery disease and patient's adherence to plan as established by provider:   Reviewed medications with patients daughter and discussed importance of compliance Reviewed scheduled/upcoming provider appointments:  Discussed plans with patient for ongoing care management follow up and provided patient with direct contact information for care management team:  Discussed signs/ symptoms of coronary artery disease.   Contact phone number provided to daughter for [facility]. Confirmed patient contacted regarding post hospital discharge meals.   Advised daughter to return call to [facility] to confirm referral received and to determine start of meal services.  Discussed fall prevention: Advised to use cane as instructed by provider and advised to keep cell phone with her at all times. Fall prevent education article mailed to patient.         Our next appointment is by telephone on 12/29/22 at 2:30 pm  Please call the care guide team at [phone] if you need to cancel or reschedule your appointment.   If you are experiencing a Mental Health or [place] or need someone to talk to, please call the Suicide and Crisis Lifeline: 988 call 1-800-273-TALK (toll free, 24 hour hotline)  The patient verbalized understanding of instructions, educational materials, and care plan provided today and agreed to receive a mailed copy of patient instructions, educational materials, and care plan.   Quinn Plowman RN,BSN,CCM [place] [phone] direct line  Fall Prevention in  Hospitals, Adult Being a patient in the hospital puts you at risk for falling. Falls can cause serious injury and harm, but they can be prevented. It is important to understand what puts you at risk for falling and what you and your health care team can do to prevent you from falling. If you or a loved one falls in the hospital, it is important to tell the hospital staff about it. What increases my risk? Certain conditions and treatments may increase your risk of falling in the hospital. These include: Being in an unfamiliar environment, especially when using the bathroom at night. Having surgery. Being on bed rest. Taking many medicines or certain types of medicines, such as sleeping pills. Having tubes in place, such as IV lines or catheters. Other risk factors for falls in a hospital include: Having difficulty with hearing or vision. Having a change in thinking or behavior, such as confusion. Having depression. Having trouble with balance or feeling dizzy. Needing to use the toilet frequently. Having fallen during the past 3 months. Having low blood pressure. What are some strategies for preventing falls? If you or a loved one has to stay in the hospital: Ask about which fall prevention strategies will be in place. Speak up if the fall prevention plan changes. Ask for help moving around, especially after surgery or when not feeling well. If you have been asked to call for help when getting up, do not get up by yourself. Asking for help to get up is for your safety, and the staff is there to help you. Wear nonskid footwear.  Get up slowly, and sit at the side of the bed for a few minutes before standing up. Keep items you need close to you, such as the nurse call button or a phone, so that you do not need to reach for them. Wear eyeglasses or hearing aids if they have been prescribed. Have someone stay in the hospital with you or your loved one. Ask if sleeping pills or other medicines  that can cause confusion are necessary. What does the hospital staff do to help prevent falls?     Hospitals have systems in place to prevent falls and accidents, which may involve: Discussing your fall risks and making a personalized fall prevention plan. Checking in regularly to see if you need help. Placing an arm band on your wrist or a sign near your room to alert other staff of your needs. Using an alarm on your hospital bed. This is an alarm that goes off if you get out of bed and forget to call for help. Keeping the bed in a low and locked position. Keeping the area around the bed and bathroom well-lit and free from clutter. Keeping your room quiet, so that you can sleep and be well rested. Using safety equipment, such as: A belt around your waist. Walkers, crutches, and other devices for support. Safety beds, such as low beds, or cushions on the floor next to the bed. Having a staff person stay with you (one-on-one observation), even when you are using the bathroom. This is for your safety. Using video monitoring. This allows a staff member to come to you if you need help. What other actions can I take to lower my risk of falls? Check in regularly with your health care provider or pharmacist to review all medicines that you take. Make sure that you have a regular exercise program to stay fit. This will help you maintain your balance. Talk with a physical therapist or trainer if recommended by your health care provider. He or she can help you improve your strength, balance, and endurance. If you are over age 38: Ask your health care provider if you need a calcium or vitamin D supplement. Have your eyes and hearing checked every year. Have your feet checked every year. Where to find more information Centers for Disease Control and Prevention: http://www.wolf.info/ Summary Being in an unfamiliar environment, such as the hospital, increases your risk for falling. If you have been asked to  call for help when getting up, do not get up by yourself. Asking for help to get up is for your safety, and the staff is there to help you. Ask about which fall prevention strategies will be in place. Speak up if the fall prevention plan changes. If you or a loved one falls, tell the hospital staff. This is important. This information is not intended to replace advice given to you by your health care provider. Make sure you discuss any questions you have with your health care provider. Document Revised: 06/17/2020 Document Reviewed: 06/17/2020 Elsevier Patient Education  [place].

## 2022-12-05 NOTE — Patient Outreach (Signed)
  Care Coordination   Follow Up Visit Note   12/05/2022 Name: Lamona Daleen Steinhaus MRN: [id number] DOB: 10/10/1941  Chaniya Dailynn Nancarrow is a 82 y.o. year old female who sees [name], MD for primary care. I spoke with  Sebastiana Earleen [place] and daughter by phone today. Patient gave verbal permission to discuss her personal health information.  What matters to the patients health and wellness today?  Management of coronary artery disease    Goals Addressed             This Visit's Progress    Develop plan of care for management of coronary artery disease       Care Coordination Interventions: Evaluation of current treatment plan related to coronary artery disease and patient's adherence to plan as established by provider:  Daughter states patient is taking her medications as prescribed. She states she takes patient to her provider appointments. Daughter states patient is not consistently walking with her cane when walking outside to get her mail and to her storage shed.  She reports patient has fallen in the past.  Reviewed medications with patients daughter and discussed importance of compliance Reviewed scheduled/upcoming provider appointments:  Per chart review patients next cardiology visit is 03/09/23 Discussed plans with patient for ongoing care management follow up and provided patient with direct contact information for care management team:  Patient/ daughter verbally agreed to next telephone follow up visit with RNCM on 12/29/22.  Discussed signs/ symptoms of coronary artery disease.   Contact phone number provided to daughter for [place]. Confirmed patient contacted regarding post hospital discharge meals.   Advised daughter to return call to [facility] to confirm referral received and to determine start of meal services.  Discussed fall prevention: Advised to use cane as instructed by provider and advised to keep cell phone with her at all times. Fall prevent education  article mailed to patient.           SDOH assessments and interventions completed:  No     Care Coordination Interventions:  Yes, provided   Follow up plan: Follow up call scheduled for 12/29/22    Encounter Outcome:  Pt. Visit Completed   Quinn Plowman RN,BSN,CCM [place] [phone] direct line

## 2022-12-08 ENCOUNTER — Telehealth: Payer: Self-pay | Admitting: Family Medicine

## 2022-12-08 NOTE — Telephone Encounter (Signed)
Pt's daughter, [name], called stating the pt has dental work done next month. Darlene stated the dentist instructed the pt to stop taking blood thinners 5 days leading up to the appt. [name] is asking if its fine? Call back # [phone]

## 2022-12-08 NOTE — Telephone Encounter (Signed)
Spoke with patients daughter and she stated patient is having some teeth pulled next month. I advised we need a form from the dentist and she stated she will call them.

## 2022-12-08 NOTE — Telephone Encounter (Signed)
What is the planned dental work?  Please get the dental clinic to send [place] a note about the planned work so we can see about options.  Thanks.

## 2022-12-10 NOTE — Telephone Encounter (Signed)
Noted.  Will await the paperwork.

## 2022-12-19 ENCOUNTER — Other Ambulatory Visit (HOSPITAL_COMMUNITY): Payer: Self-pay

## 2022-12-20 ENCOUNTER — Ambulatory Visit: Payer: Medicare Other | Admitting: Gastroenterology

## 2022-12-20 ENCOUNTER — Telehealth: Payer: Self-pay | Admitting: Family Medicine

## 2022-12-20 NOTE — Telephone Encounter (Signed)
Called patient to see when procedure is; lmtcb.

## 2022-12-20 NOTE — Telephone Encounter (Signed)
Pt came in office stated she is schedule to have dental work done need to know if she need to stop her blood thinners drop off paper with regarding dental treatment . Please advise [phone]

## 2022-12-21 ENCOUNTER — Telehealth: Payer: Self-pay | Admitting: Family Medicine

## 2022-12-21 MED ORDER — APIXABAN 5 MG PO TABS: 5.0000 mg | ORAL_TABLET | Freq: Two times a day (BID) | ORAL | 3 refills | Status: DC

## 2022-12-21 NOTE — Telephone Encounter (Signed)
Ex sent

## 2022-12-21 NOTE — Telephone Encounter (Signed)
I will check the hardcopy when I am back in clinic.  Thanks.

## 2022-12-21 NOTE — Telephone Encounter (Signed)
Spoke with patients daughter and she stated that patients procedure is on 01/19/23. Tx plan from dentist is in your inbox for review of what will be done.

## 2022-12-21 NOTE — Telephone Encounter (Signed)
Prescription Request  12/21/2022  Is this a "Controlled Substance" medicine? No  LOV: 12/01/2022  What is the name of the medication or equipment? apixaban (ELIQUIS) 5 MG TABS tablet   Have you contacted your pharmacy to request a refill? No   Which pharmacy would you like this sent to?  [id number]- [place], [place] - [address] [id number]: [phone]: [phone]    Patient notified that their request is being sent to the clinical staff for review and that they should receive a response within 2 business days.   Please advise at Mobile [phone](mobile)

## 2022-12-21 NOTE — Addendum Note (Signed)
Addended by: Sherrilee Gilles B on: 12/21/2022 03:19 PM   Modules accepted: Orders

## 2022-12-28 ENCOUNTER — Telehealth: Payer: Self-pay | Admitting: Family Medicine

## 2022-12-28 NOTE — Telephone Encounter (Signed)
Please check with patient/dental clinic.    I saw the dental plan.  She was admitted for PE on 11/22/22. At this point I think it makes sense to plan for at least 3 months of anticoagulation (with duration up to 6 months if possible/tolerated).  I do not expect the dental clinic to be able to complete this dental work while she is on anticoagulation.  Can this be pushed back to at least early April?  That would allow for a full 3 months of anticoagulation.    Please see if the dental clinic is willing to perform the dental work on the Lovenox bridge in early April.  Please let me know.  Thanks.

## 2022-12-29 ENCOUNTER — Ambulatory Visit: Payer: Self-pay

## 2022-12-29 NOTE — Telephone Encounter (Signed)
Spoke with patients daughter and she is okay with having patient wait until early April to get her dentures done. She said it can not be too long after that or she will have to be refitted for them. She also wanted to know if she will be rechecked to make sure the blood clot resolved?

## 2022-12-29 NOTE — Patient Outreach (Signed)
  Care Coordination   Follow Up Visit Note   12/29/2022 Name: Kelly Kennedy MRN: [id number] DOB: 1941-05-01  Kelly Kennedy is a 82 y.o. year old female who sees [name], MD for primary care. I spoke with daughter, [name] and patient [name] by phone today.  What matters to the patients health and wellness today?  Maintaining/ managing heart conditions    Goals Addressed             This Visit's Progress    Develop plan of care for management of coronary artery disease       Care Coordination Interventions: Evaluation of current treatment plan related to coronary artery disease and patient's adherence to plan as established by provider:  Daughter states patient is losing a little weight. She states patient sometimes eats 1 x per day.  Denies patient complaining of lack of appetite.   Patient states she feels she is doing ok.  Denies any changes with her medications. She states she still occasionally gets short of breath but this is normal for her.  Patient states she uses her cane sometimes.  Patient states her daughter checks her blood pressure sometimes. She states she is not able to remember any of the readings.  Encouraged patient to have blood pressure checked at least 2-3 timed per week and record.  Encouraged patient to use cane when walking around inside/ outside of her home.  Discussed medications and importance of compliance.  Reviewed scheduled/upcoming provider appointments:  Per chart review patients next cardiology visit is 03/09/23 Discussed plans with patient for ongoing care management follow up:  Patient verbally agreed to next telephone follow up with RNCM on 02/09/23.  Discussed signs/ symptoms of coronary artery disease.   Discussed home management for coronary artery disease:  Take medications as prescribed, monitor blood pressure and record, adhere to a heart healthy diet, maintain weight and stay as active as possible.            SDOH assessments and interventions completed:  No     Care Coordination Interventions:  Yes, provided   Follow up plan: Follow up call scheduled for 02/09/23    Encounter Outcome:  Pt. Visit Completed   Quinn Plowman RN,BSN,CCM [place] [phone] direct line

## 2022-12-30 NOTE — Telephone Encounter (Signed)
Spoke with patients daughter and advised below. She scheduled dental procedure for May and is fine with redoing [place] in march.

## 2022-12-30 NOTE — Telephone Encounter (Signed)
Here is the plan I propose:  Patient/family check with dental clinic about scheduling for early April.  Continue anticoagulation in the meantime.  I would not repeat CT chest given the radiation and dye load but we can repeat the leg ultrasound in late March to make sure she does not have residual clot at that point.  That would not involve radiation or dye exposure.  We can make plans going forward at that point.  This is assuming she does not have any clinical changes in the meantime.  I made a reminder in the EMR about checking a single leg ultrasound in March.  Thanks.

## 2023-01-03 ENCOUNTER — Ambulatory Visit (INDEPENDENT_AMBULATORY_CARE_PROVIDER_SITE_OTHER): Payer: 59

## 2023-01-03 DIAGNOSIS — E538 Deficiency of other specified B group vitamins: Secondary | ICD-10-CM

## 2023-01-03 MED ORDER — CYANOCOBALAMIN 1000 MCG/ML IJ SOLN
1000.0000 ug | Freq: Once | INTRAMUSCULAR | Status: AC
  Administered 2023-01-03: 1000 ug via INTRAMUSCULAR

## 2023-01-03 NOTE — Progress Notes (Signed)
Per orders of Dr. [name], injection of Vitamin B 12 injection given in right deltoid given by Ozzie Hoyle. Patient tolerated injection well.

## 2023-01-20 ENCOUNTER — Telehealth: Payer: Self-pay | Admitting: Family Medicine

## 2023-01-20 NOTE — Telephone Encounter (Signed)
Pt daughter called wants to know can RX diclofenac Sodium (VOLTAREN) 1 % GEL  can be increase . Please advise [phone] (

## 2023-01-20 NOTE — Telephone Encounter (Signed)
Patient would like a bigger tube if possible sent to [facility]

## 2023-01-20 NOTE — Telephone Encounter (Signed)
1% used 4 times a day is the max.  Does she need a rx for bigger quantity?  Please let me know.  Thanks.

## 2023-01-22 MED ORDER — DICLOFENAC SODIUM 1 % EX GEL: 2.0000 g | Freq: Four times a day (QID) | CUTANEOUS | 3 refills | Status: DC | PRN

## 2023-01-22 NOTE — Telephone Encounter (Signed)
Sent. Thanks.   

## 2023-01-22 NOTE — Addendum Note (Signed)
Addended by: [name] on: 01/22/2023 02:06 PM   Modules accepted: Orders

## 2023-01-23 DIAGNOSIS — H34831 Tributary (branch) retinal vein occlusion, right eye, with macular edema: Secondary | ICD-10-CM | POA: Diagnosis not present

## 2023-01-23 DIAGNOSIS — H0288A Meibomian gland dysfunction right eye, upper and lower eyelids: Secondary | ICD-10-CM | POA: Diagnosis not present

## 2023-01-23 DIAGNOSIS — H401131 Primary open-angle glaucoma, bilateral, mild stage: Secondary | ICD-10-CM | POA: Diagnosis not present

## 2023-01-23 DIAGNOSIS — Z961 Presence of intraocular lens: Secondary | ICD-10-CM | POA: Diagnosis not present

## 2023-01-23 DIAGNOSIS — E119 Type 2 diabetes mellitus without complications: Secondary | ICD-10-CM | POA: Diagnosis not present

## 2023-01-23 DIAGNOSIS — H353131 Nonexudative age-related macular degeneration, bilateral, early dry stage: Secondary | ICD-10-CM | POA: Diagnosis not present

## 2023-01-23 DIAGNOSIS — H0288B Meibomian gland dysfunction left eye, upper and lower eyelids: Secondary | ICD-10-CM | POA: Diagnosis not present

## 2023-01-25 ENCOUNTER — Other Ambulatory Visit: Payer: Self-pay | Admitting: Family Medicine

## 2023-01-27 DIAGNOSIS — Z961 Presence of intraocular lens: Secondary | ICD-10-CM | POA: Diagnosis not present

## 2023-01-27 DIAGNOSIS — H3582 Retinal ischemia: Secondary | ICD-10-CM | POA: Diagnosis not present

## 2023-01-27 DIAGNOSIS — H34831 Tributary (branch) retinal vein occlusion, right eye, with macular edema: Secondary | ICD-10-CM | POA: Diagnosis not present

## 2023-01-27 DIAGNOSIS — H35363 Drusen (degenerative) of macula, bilateral: Secondary | ICD-10-CM | POA: Diagnosis not present

## 2023-01-27 DIAGNOSIS — H35033 Hypertensive retinopathy, bilateral: Secondary | ICD-10-CM | POA: Diagnosis not present

## 2023-01-27 DIAGNOSIS — H35453 Secondary pigmentary degeneration, bilateral: Secondary | ICD-10-CM | POA: Diagnosis not present

## 2023-01-27 DIAGNOSIS — H3561 Retinal hemorrhage, right eye: Secondary | ICD-10-CM | POA: Diagnosis not present

## 2023-01-27 DIAGNOSIS — H353131 Nonexudative age-related macular degeneration, bilateral, early dry stage: Secondary | ICD-10-CM | POA: Diagnosis not present

## 2023-02-01 ENCOUNTER — Ambulatory Visit (INDEPENDENT_AMBULATORY_CARE_PROVIDER_SITE_OTHER): Payer: 59

## 2023-02-01 DIAGNOSIS — E538 Deficiency of other specified B group vitamins: Secondary | ICD-10-CM | POA: Diagnosis not present

## 2023-02-01 DIAGNOSIS — H34831 Tributary (branch) retinal vein occlusion, right eye, with macular edema: Secondary | ICD-10-CM | POA: Diagnosis not present

## 2023-02-01 MED ORDER — CYANOCOBALAMIN 1000 MCG/ML IJ SOLN
1000.0000 ug | Freq: Once | INTRAMUSCULAR | Status: AC
  Administered 2023-02-01: 1000 ug via INTRAMUSCULAR

## 2023-02-01 NOTE — Progress Notes (Addendum)
Per orders of Dr. Ria Bush, injection of B12 given by Pat Kocher in left deltoid. Patient tolerated injection well. Patient will make appointment for 1 month.

## 2023-02-09 ENCOUNTER — Ambulatory Visit (INDEPENDENT_AMBULATORY_CARE_PROVIDER_SITE_OTHER): Payer: 59 | Admitting: Nurse Practitioner

## 2023-02-09 ENCOUNTER — Ambulatory Visit: Payer: Self-pay

## 2023-02-09 ENCOUNTER — Ambulatory Visit (INDEPENDENT_AMBULATORY_CARE_PROVIDER_SITE_OTHER)
Admission: RE | Admit: 2023-02-09 | Discharge: 2023-02-09 | Disposition: A | Discharge status: Home / Self Care | Payer: 59 | Source: Ambulatory Visit | Attending: Nurse Practitioner | Admitting: Nurse Practitioner

## 2023-02-09 ENCOUNTER — Encounter: Payer: Self-pay | Admitting: Nurse Practitioner

## 2023-02-09 VITALS — BP 136/88 | HR 85 | Temp 98.3°F | Resp 16 | Ht 65.0 in | Wt 179.5 lb

## 2023-02-09 DIAGNOSIS — M25569 Pain in unspecified knee: Secondary | ICD-10-CM | POA: Insufficient documentation

## 2023-02-09 DIAGNOSIS — M1711 Unilateral primary osteoarthritis, right knee: Secondary | ICD-10-CM | POA: Diagnosis not present

## 2023-02-09 DIAGNOSIS — M25561 Pain in right knee: Secondary | ICD-10-CM

## 2023-02-09 DIAGNOSIS — M25461 Effusion, right knee: Secondary | ICD-10-CM

## 2023-02-09 DIAGNOSIS — W19XXXA Unspecified fall, initial encounter: Secondary | ICD-10-CM | POA: Diagnosis not present

## 2023-02-09 NOTE — Patient Outreach (Signed)
Care Coordination   Follow Up Visit Note   02/09/2023 Name: Kelly Kennedy MRN: [id number] DOB: Jun 07, 1941  Kelly Kennedy is a 82 y.o. year old female who sees [name], MD for primary care. I spoke with  Kelly Kennedy by phone today.  What matters to the patients health and wellness today?  Patient reports having visit with primary care provider today due to ongoing right knee pain. Patient states she fell back in December 2023 and knee continues to swell and cause pain.  Patient states she is waiting to hear back from her provider office regarding results of xray.  Patient denies any increase shortness of breath or chest pain.  She states she continues to take her medications as prescribed.  Patient reports she remains active.  She reports cutting her grass this week using a push mower.  Patient reports her blood pressure is checked on occasion and not recorded.     Goals Addressed             This Visit's Progress    Develop plan of care for management of coronary artery disease       Interventions Today    Flowsheet Row Most Recent Value  Chronic Disease   Chronic disease during today's visit Other  [coronary artery disease]  General Interventions   General Interventions Discussed/Reviewed General Interventions Reviewed, Doctor Visits  [Evaluation of current treatmentplan related to coronary artery disease and patients adherence to plan as established by provider. Reviewed scheduled / upcoming provider visits.]  Exercise Interventions   Exercise Discussed/Reviewed Physical Activity  Physical Activity Discussed/Reviewed Physical Activity Reviewed  Kathlynn Grate for physical activity.]  Education Interventions   Education Provided Provided Education  Provided Verbal Education On Other  [Advised to check blood pressure at least 2-3 times per week and record. Advised to notify provider for any increase in shortness of breath with exertion.  Advised to call 911  for severe symptoms of SOB or chest pain.]  Pharmacy Interventions   Pharmacy Dicussed/Reviewed Pharmacy Topics Reviewed  [Medications reviewed and compliance discussed.]            management of knee pain       Interventions Today    Flowsheet Row Most Recent Value  Chronic Disease   Chronic disease during today's visit Other  [right knee pain,]  General Interventions   General Interventions Discussed/Reviewed General Interventions Reviewed  [evaluation of current treatment plan related to right knee pain and patients adherence to plan as established by provider.]  Doctor Visits Discussed/Reviewed Doctor Visits Reviewed  [Discussed primary care provider visit from today 02/09/23 regarding patients knee pain]  Exercise Interventions   Exercise Discussed/Reviewed Physical Activity  Physical Activity Discussed/Reviewed Physical Activity Reviewed  Kathlynn Grate for physical activity.]  Education Interventions   Education Provided Provided Education  [reinforced provider recommended instructions to manage knee pain:  rest, elevate, ice.  Advised to notify provider if pain worsens or knee gives out on her.  Call provider office regarding xray results if you do not hear from them.]  Provided Verbal Education On Other  [Advised to check blood pressure at least 2-3 times per week and record. Advised to notify provider for any increase in shortness of breath with exertion.  Advised to call 911 for severe symptoms of SOB or chest pain.]  Pharmacy Interventions   Pharmacy Dicussed/Reviewed Pharmacy Topics Reviewed  [Medications reviewed and compliance discussed.]  Safety Interventions   Safety Discussed/Reviewed Fall Risk  [Continue to use  cane when ambulating as recommended.]              SDOH assessments and interventions completed:  No     Care Coordination Interventions:  Yes, provided   Follow up plan: Follow up call scheduled for 03/27/23    Encounter Outcome:  Pt. Visit Completed    Quinn Plowman RN,BSN,CCM [place] [phone] direct line

## 2023-02-09 NOTE — Patient Instructions (Signed)
You can use tylenol over the counter as needed You can also use voltaren gel to help I will be in touch with the xray once I have the results Follow up if no improvement

## 2023-02-09 NOTE — Assessment & Plan Note (Signed)
Sounds mechanical in nature.  No other complaints per patient.

## 2023-02-09 NOTE — Assessment & Plan Note (Signed)
Been going on for extended period of time.  Will obtain x-ray.  Patient continues an over-the-counter topical analgesics such as Voltaren gel and can do Tylenol over-the-counter as directed.  Patient rest elevate and use ice as needed also.  Pending x-ray

## 2023-02-09 NOTE — Progress Notes (Signed)
   Acute Office Visit  Subjective:     Patient ID: Kelly Kennedy, female    DOB: 1941-05-18, 82 y.o.   MRN: [id number]  Chief Complaint  Patient presents with   Joint Swelling    Right X 2 months    HPI Patient is in today for right joint swelling with a history of DVT, CAD, HTN, NSTEMI, Fatty liver, DM, hypothyroidism, breast cancer  Daughter is present and helps with the history  Right knee swellingfor approx 2 months. Daughter states that she did fall States that she trip and landed on grass. States that she has had trouble with her right knee in the past, prior to the fall. But has worsened since the fall States that she has tried topical that has helped, no oral medications so far. Sharp pain that hurts all the time  Weight bearing makes it worse  Review of Systems  Constitutional:  Negative for chills and fever.  Respiratory:  Negative for shortness of breath.   Cardiovascular:  Negative for chest pain.  Musculoskeletal:  Positive for falls and joint pain.        Objective:    BP 136/88   Pulse 85   Temp 98.3 F (36.8 C)   Resp 16   Ht 5\' 5"  (1.651 m)   Wt 179 lb 8 oz (81.4 kg)   SpO2 97%   BMI 29.87 kg/m    Physical Exam Vitals and nursing note reviewed.  Constitutional:      Appearance: Normal appearance.  Cardiovascular:     Rate and Rhythm: Normal rate and regular rhythm.     Heart sounds: Normal heart sounds.  Pulmonary:     Effort: Pulmonary effort is normal.     Breath sounds: Normal breath sounds.  Musculoskeletal:        General: Tenderness present.     Right lower leg: Tenderness present. No bony tenderness. 1+ Pitting Edema present.     Left lower leg: 1+ Pitting Edema present.       Legs:  Neurological:     Mental Status: She is alert.     No results found for any visits on 02/09/23.      Assessment & Plan:   Problem List Items Addressed This Visit       Other   Pain and swelling of right knee    Been going on for  extended period of time.  Will obtain x-ray.  Patient continues an over-the-counter topical analgesics such as Voltaren gel and can do Tylenol over-the-counter as directed.  Patient rest elevate and use ice as needed also.  Pending x-ray      Relevant Orders   DG Knee Complete 4 Views Right   Fall - Primary    Sounds mechanical in nature.  No other complaints per patient.      Relevant Orders   DG Knee Complete 4 Views Right    No orders of the defined types were placed in this encounter.   Return if symptoms worsen or fail to improve.  Romilda Garret, NP

## 2023-02-11 NOTE — Progress Notes (Unsigned)
    Kelly Kunert T. Nikie Cid, MD, [place] at [facility] [place], [address]  Phone: [phone]  FAX: [phone]  Kelly Kennedy - 82 y.o. female  MRN [id number]  Date of Birth: 1941-11-22  Date: 02/13/2023  PCP: [name], MD  Referral: [name], MD  No chief complaint on file.  Subjective:   Kelly Kennedy is a 82 y.o. very pleasant female patient with There is no height or weight on file to calculate BMI. who presents with the following:  Pleasant patient presents with right-sided knee pain and osteoarthritis.  I actually saw her roughly 10 years ago for some arthritis on the left knee.  She is now having some exacerbation and a joint effusion.    Review of Systems is noted in the HPI, as appropriate  Objective:   There were no vitals taken for this visit.  GEN: No acute distress; alert,appropriate. PULM: Breathing comfortably in no respiratory distress PSYCH: Normally interactive.   Laboratory and Imaging Data:  Assessment and Plan:   ***

## 2023-02-13 ENCOUNTER — Ambulatory Visit (INDEPENDENT_AMBULATORY_CARE_PROVIDER_SITE_OTHER): Payer: 59 | Admitting: Family Medicine

## 2023-02-13 ENCOUNTER — Ambulatory Visit (INDEPENDENT_AMBULATORY_CARE_PROVIDER_SITE_OTHER): Payer: 59

## 2023-02-13 ENCOUNTER — Encounter: Payer: Self-pay | Admitting: Family Medicine

## 2023-02-13 VITALS — BP 180/85 | HR 97 | Temp 97.8°F | Ht 65.0 in | Wt 179.4 lb

## 2023-02-13 VITALS — Ht 65.0 in | Wt 179.0 lb

## 2023-02-13 DIAGNOSIS — H918X2 Other specified hearing loss, left ear: Secondary | ICD-10-CM

## 2023-02-13 DIAGNOSIS — M1711 Unilateral primary osteoarthritis, right knee: Secondary | ICD-10-CM

## 2023-02-13 DIAGNOSIS — Z0000 Encounter for general adult medical examination without abnormal findings: Secondary | ICD-10-CM

## 2023-02-13 MED ORDER — APIXABAN 5 MG PO TABS: 5.0000 mg | ORAL_TABLET | Freq: Two times a day (BID) | ORAL | 3 refills | Status: DC

## 2023-02-13 MED ORDER — LEVOTHYROXINE SODIUM 75 MCG PO TABS: ORAL_TABLET | ORAL | 2 refills | Status: DC

## 2023-02-13 MED ORDER — TRIAMCINOLONE ACETONIDE 40 MG/ML IJ SUSP
40.0000 mg | Freq: Once | INTRAMUSCULAR | Status: AC
  Administered 2023-02-13: 40 mg via INTRA_ARTICULAR

## 2023-02-13 NOTE — Patient Instructions (Signed)
Kelly Kennedy , Thank you for taking time to come for your Medicare Wellness Visit. I appreciate your ongoing commitment to your health goals. Please review the following plan we discussed and let me know if I can assist you in the future.   These are the goals we discussed:  Goals      Develop plan of care for management of coronary artery disease     Interventions Today    Flowsheet Row Most Recent Value  Chronic Disease   Chronic disease during today's visit Other  [coronary artery disease]  General Interventions   General Interventions Discussed/Reviewed General Interventions Reviewed, Doctor Visits  [Evaluation of current treatmentplan related to coronary artery disease and patients adherence to plan as established by provider. Reviewed scheduled / upcoming provider visits.]  Exercise Interventions   Exercise Discussed/Reviewed Physical Activity  Physical Activity Discussed/Reviewed Physical Activity Reviewed  Kathlynn Grate for physical activity.]  Education Interventions   Education Provided Provided Education  Provided Verbal Education On Other  [Advised to check blood pressure at least 2-3 times per week and record. Advised to notify provider for any increase in shortness of breath with exertion.  Advised to call 911 for severe symptoms of SOB or chest pain.]  Pharmacy Interventions   Pharmacy Dicussed/Reviewed Pharmacy Topics Reviewed  [Medications reviewed and compliance discussed.]            Increase water intake     Starting 12/09/2016, I will drink at least 6-8 glasses of water.      management of knee pain     Interventions Today    Flowsheet Row Most Recent Value  Chronic Disease   Chronic disease during today's visit Other  [right knee pain,]  General Interventions   General Interventions Discussed/Reviewed General Interventions Reviewed  [evaluation of current treatment plan related to right knee pain and patients adherence to plan as established by provider.]  Doctor  Visits Discussed/Reviewed Doctor Visits Reviewed  [Discussed primary care provider visit from today 02/09/23 regarding patients knee pain]  Exercise Interventions   Exercise Discussed/Reviewed Physical Activity  Physical Activity Discussed/Reviewed Physical Activity Reviewed  Kathlynn Grate for physical activity.]  Education Interventions   Education Provided Provided Education  [reinforced provider recommended instructions to manage knee pain:  rest, elevate, ice.  Advised to notify provider if pain worsens or knee gives out on her.  Call provider office regarding xray results if you do not hear from them.]  Provided Verbal Education On Other  [Advised to check blood pressure at least 2-3 times per week and record. Advised to notify provider for any increase in shortness of breath with exertion.  Advised to call 911 for severe symptoms of SOB or chest pain.]  Pharmacy Interventions   Pharmacy Dicussed/Reviewed Pharmacy Topics Reviewed  [Medications reviewed and compliance discussed.]  Safety Interventions   Safety Discussed/Reviewed Fall Risk  [Continue to use cane when ambulating as recommended.]           Patient Stated     Would like to start walking again        This is a list of the screening recommended for you and due dates:  Health Maintenance  Topic Date Due   Yearly kidney health urinalysis for diabetes  Never done   Zoster (Shingles) Vaccine (1 of 2) Never done   DTaP/Tdap/Td vaccine (2 - Tdap) 01/29/2017   COVID-19 Vaccine (5 - 2023-24 season) 07/29/2022   Colon Cancer Screening  10/01/2022   Eye exam for diabetics  01/05/2023  Complete foot exam   02/08/2023   Mammogram  05/25/2023   Hemoglobin A1C  05/25/2023   Yearly kidney function blood test for diabetes  12/02/2023   Medicare Annual Wellness Visit  02/13/2024   Pneumonia Vaccine  Completed   Flu Shot  Completed   DEXA scan (bone density measurement)  Completed   HPV Vaccine  Aged Out    Advanced directives:  yes  Conditions/risks identified: falls risk  Next appointment: Follow up in one year for your annual wellness visit 02/15/2024 @1 :30pm telephone   Preventive Care 65 Years and Older, Female Preventive care refers to lifestyle choices and visits with your health care provider that can promote health and wellness. What does preventive care include? A yearly physical exam. This is also called an annual well check. Dental exams once or twice a year. Routine eye exams. Ask your health care provider how often you should have your eyes checked. Personal lifestyle choices, including: Daily care of your teeth and gums. Regular physical activity. Eating a healthy diet. Avoiding tobacco and drug use. Limiting alcohol use. Practicing safe sex. Taking low-dose aspirin every day. Taking vitamin and mineral supplements as recommended by your health care provider. What happens during an annual well check? The services and screenings done by your health care provider during your annual well check will depend on your age, overall health, lifestyle risk factors, and family history of disease. Counseling  Your health care provider may ask you questions about your: Alcohol use. Tobacco use. Drug use. Emotional well-being. Home and relationship well-being. Sexual activity. Eating habits. History of falls. Memory and ability to understand (cognition). Work and work Statistician. Reproductive health. Screening  You may have the following tests or measurements: Height, weight, and BMI. Blood pressure. Lipid and cholesterol levels. These may be checked every 5 years, or more frequently if you are over 66 years old. Skin check. Lung cancer screening. You may have this screening every year starting at age 55 if you have a 30-pack-year history of smoking and currently smoke or have quit within the past 15 years. Fecal occult blood test (FOBT) of the stool. You may have this test every year starting at  age 98. Flexible sigmoidoscopy or colonoscopy. You may have a sigmoidoscopy every 5 years or a colonoscopy every 10 years starting at age 18. Hepatitis C blood test. Hepatitis B blood test. Sexually transmitted disease (STD) testing. Diabetes screening. This is done by checking your blood sugar (glucose) after you have not eaten for a while (fasting). You may have this done every 1-3 years. Bone density scan. This is done to screen for osteoporosis. You may have this done starting at age 81. Mammogram. This may be done every 1-2 years. Talk to your health care provider about how often you should have regular mammograms. Talk with your health care provider about your test results, treatment options, and if necessary, the need for more tests. Vaccines  Your health care provider may recommend certain vaccines, such as: Influenza vaccine. This is recommended every year. Tetanus, diphtheria, and acellular pertussis (Tdap, Td) vaccine. You may need a Td booster every 10 years. Zoster vaccine. You may need this after age 10. Pneumococcal 13-valent conjugate (PCV13) vaccine. One dose is recommended after age 81. Pneumococcal polysaccharide (PPSV23) vaccine. One dose is recommended after age 67. Talk to your health care provider about which screenings and vaccines you need and how often you need them. This information is not intended to replace advice given to you  by your health care provider. Make sure you discuss any questions you have with your health care provider. Document Released: 12/11/2015 Document Revised: 08/03/2016 Document Reviewed: 09/15/2015 Elsevier Interactive Patient Education  2017 [employer] Prevention in the Home Falls can cause injuries. They can happen to people of all ages. There are many things you can do to make your home safe and to help prevent falls. What can I do on the outside of my home? Regularly fix the edges of walkways and driveways and fix any  cracks. Remove anything that might make you trip as you walk through a door, such as a raised step or threshold. Trim any bushes or trees on the path to your home. Use bright outdoor lighting. Clear any walking paths of anything that might make someone trip, such as rocks or tools. Regularly check to see if handrails are loose or broken. Make sure that both sides of any steps have handrails. Any raised decks and porches should have guardrails on the edges. Have any leaves, snow, or ice cleared regularly. Use sand or salt on walking paths during winter. Clean up any spills in your garage right away. This includes oil or grease spills. What can I do in the bathroom? Use night lights. Install grab bars by the toilet and in the tub and shower. Do not use towel bars as grab bars. Use non-skid mats or decals in the tub or shower. If you need to sit down in the shower, use a plastic, non-slip stool. Keep the floor dry. Clean up any water that spills on the floor as soon as it happens. Remove soap buildup in the tub or shower regularly. Attach bath mats securely with double-sided non-slip rug tape. Do not have throw rugs and other things on the floor that can make you trip. What can I do in the bedroom? Use night lights. Make sure that you have a light by your bed that is easy to reach. Do not use any sheets or blankets that are too big for your bed. They should not hang down onto the floor. Have a firm chair that has side arms. You can use this for support while you get dressed. Do not have throw rugs and other things on the floor that can make you trip. What can I do in the kitchen? Clean up any spills right away. Avoid walking on wet floors. Keep items that you use a lot in easy-to-reach places. If you need to reach something above you, use a strong step stool that has a grab bar. Keep electrical cords out of the way. Do not use floor polish or wax that makes floors slippery. If you must  use wax, use non-skid floor wax. Do not have throw rugs and other things on the floor that can make you trip. What can I do with my stairs? Do not leave any items on the stairs. Make sure that there are handrails on both sides of the stairs and use them. Fix handrails that are broken or loose. Make sure that handrails are as long as the stairways. Check any carpeting to make sure that it is firmly attached to the stairs. Fix any carpet that is loose or worn. Avoid having throw rugs at the top or bottom of the stairs. If you do have throw rugs, attach them to the floor with carpet tape. Make sure that you have a light switch at the top of the stairs and the bottom of the stairs. If  you do not have them, ask someone to add them for you. What else can I do to help prevent falls? Wear shoes that: Do not have high heels. Have rubber bottoms. Are comfortable and fit you well. Are closed at the toe. Do not wear sandals. If you use a stepladder: Make sure that it is fully opened. Do not climb a closed stepladder. Make sure that both sides of the stepladder are locked into place. Ask someone to hold it for you, if possible. Clearly mark and make sure that you can see: Any grab bars or handrails. First and last steps. Where the edge of each step is. Use tools that help you move around (mobility aids) if they are needed. These include: Canes. Walkers. Scooters. Crutches. Turn on the lights when you go into a dark area. Replace any light bulbs as soon as they burn out. Set up your furniture so you have a clear path. Avoid moving your furniture around. If any of your floors are uneven, fix them. If there are any pets around you, be aware of where they are. Review your medicines with your doctor. Some medicines can make you feel dizzy. This can increase your chance of falling. Ask your doctor what other things that you can do to help prevent falls. This information is not intended to replace  advice given to you by your health care provider. Make sure you discuss any questions you have with your health care provider. Document Released: 09/10/2009 Document Revised: 04/21/2016 Document Reviewed: 12/19/2014 Elsevier Interactive Patient Education  2017 [employer].

## 2023-02-13 NOTE — Progress Notes (Signed)
I connected with  Kelly Kennedy on 02/13/23 by a audio enabled telemedicine application and verified that I am speaking with the correct person using two identifiers.  Patient Location: Home  Provider Location: Office/Clinic  I discussed the limitations of evaluation and management by telemedicine. The patient expressed understanding and agreed to proceed.  Subjective:   Kelly Kennedy is a 82 y.o. female who presents for Medicare Annual (Subsequent) preventive examination.  Review of Systems    Cardiac Risk Factors include: advanced age (>32men, >25 women);diabetes mellitus;dyslipidemia;hypertension;sedentary lifestyle    Objective:    Today's Vitals   02/13/23 1413 02/13/23 1414  Weight: 179 lb (81.2 kg)   Height: 5\' 5"  (1.651 m)   PainSc:  5    Body mass index is 29.79 kg/m.     02/13/2023    2:33 PM 11/23/2022    8:29 AM 02/08/2022    2:53 PM 02/13/2021    3:06 PM 08/07/2019   11:30 AM 09/21/2018    6:13 AM 07/27/2018   12:31 PM  Advanced Directives  Does Patient Have a Medical Advance Directive? Yes No Yes Yes Yes Yes No  Type of Advance Directive   Living will [place];Living will Living will Living will   Does patient want to make changes to medical advance directive?   Yes (MAU/Ambulatory/Procedural Areas - Information given)   No - Patient declined   Copy of [place] in Chart?      No - copy requested   Would patient like information on creating a medical advance directive?  No - Patient declined   No - Patient declined      Current Medications (verified) Outpatient Encounter Medications as of 02/13/2023  Medication Sig   amitriptyline (ELAVIL) 50 MG tablet TAKE 1-2 TABLETS (50-100 MG TOTAL) BY MOUTH AT BEDTIME AS NEEDED. FOR SLEEP   amLODipine (NORVASC) 5 MG tablet Take 5 mg by mouth daily.   apixaban (ELIQUIS) 5 MG TABS tablet Take 1 tablet (5 mg total) by mouth 2 (two) times daily.   aspirin EC 81 MG tablet  Take 1 tablet (81 mg total) by mouth daily. Swallow whole.   carvedilol (COREG) 12.5 MG tablet Take 1 tablet (12.5 mg total) by mouth 2 (two) times daily.   cholestyramine (QUESTRAN) 4 g packet TAKE 1 PACKET (4 G TOTAL) BY MOUTH 3 (THREE) TIMES DAILY WITH MEALS.   cyanocobalamin (,VITAMIN B-12,) 1000 MCG/ML injection Inject 1 mL (1,000 mcg total) into the muscle every 30 (thirty) days.   diclofenac Sodium (VOLTAREN) 1 % GEL Apply 2 g topically 4 (four) times daily as needed.   donepezil (ARICEPT ODT) 5 MG disintegrating tablet TAKE 1 TABLET BY MOUTH EVERYDAY AT BEDTIME   Evolocumab (REPATHA SURECLICK) [id number] MG/ML SOAJ ADMINISTER 1 ML UNDER THE SKIN EVERY 14 DAYS   fluocinonide cream (LIDEX) 0.05 % APPLY TOPICALLY DAILY AS NEEDED. USE SPARINGLY.   isosorbide mononitrate (IMDUR) 30 MG 24 hr tablet TAKE 1 TABLET BY MOUTH EVERY DAY   levothyroxine (SYNTHROID) 75 MCG tablet TAKE 1 TABLET BY MOUTH EVERY DAY BEFORE BREAKFAST   Lifitegrast (XIIDRA) 5 % SOLN Apply 1 drop to eye in the morning and at bedtime.   LUMIGAN 0.01 % SOLN Place 1 drop into both eyes at bedtime.   neomycin-polymyxin b-dexamethasone (MAXITROL) 3.5-10000-0.1 OINT Place 1 Application into the right eye 3 (three) times daily.   nitrofurantoin (MACRODANTIN) 100 MG capsule TAKE 1 CAPSULE BY MOUTH EVERY DAY   nitroGLYCERIN (  NITROSTAT) 0.4 MG SL tablet PLACE 1 TABLET UNDER THE TONGUE EVERY 5 MINUTES AS NEEDED FOR CHEST PAIN.   ofloxacin (OCUFLOX) 0.3 % ophthalmic solution Place 1 drop into the right eye 4 (four) times daily.   omeprazole (PRILOSEC) 40 MG capsule Take 1 capsule (40 mg total) by mouth daily.   solifenacin (VESICARE) 5 MG tablet TAKE 1 TABLET (5 MG TOTAL) BY MOUTH DAILY.   No facility-administered encounter medications on file as of 02/13/2023.    Allergies (verified) Ace inhibitors, Angiotensin receptor blockers, Statins, Flonase [fluticasone propionate], Glipizide, Iron, Sulfa antibiotics, Niacin, and Red dye    History: Past Medical History:  Diagnosis Date   adenomatous Colon polyps    colonoscopy 8/99, 12/02, 8/06   Arthritis    Bell's palsy    Cancer (HCC)    DCIS left breast   Cataract    Chest pain    uses NTG as needed   Depression    Diabetes mellitus without complication ([place])    Diverticulosis of colon 06/2005   Ductal carcinoma in situ (DCIS) of left breast    Family history of adverse reaction to anesthesia    " my mother didn't wake up so they had to put her on life support for about an hour or more; my sisiter has problems waking up too from anesthesia.   GERD (gastroesophageal reflux disease)    Glaucoma    H/O hiatal hernia    Headache(784.0)    Hyperglycemia    Hyperlipidemia    Hypertension    Hypothyroid    Insomnia    Irritable bowel    Menopausal symptoms    Pericarditis 1980's   Stroke [facility])    TIA (transient ischemic attack)    09/2014   Past Surgical History:  Procedure Laterality Date   APPENDECTOMY     BREAST EXCISIONAL BIOPSY Right    benign   BREAST RECONSTRUCTION WITH PLACEMENT OF TISSUE EXPANDER AND FLEX HD (ACELLULAR HYDRATED DERMIS) Left 08/06/2015   Procedure: LEFT BREAST RECONSTRUCTION WITH PLACEMENT OF SALINE IMPLANT AND ACELLULAR DURMAL MATRIX;  Surgeon: Crissie Reese, MD;  Location: [place];  Service: Plastics;  Laterality: Left;   CARDIAC CATHETERIZATION     CATARACT EXTRACTION Bilateral 2016   CHOLECYSTECTOMY  1990   COLONOSCOPY     CYSTECTOMY  05/04/2008   left lower arm, benign   ECTOPIC PREGNANCY SURGERY  1971   FRACTURE SURGERY     nose   LEFT HEART CATH AND CORONARY ANGIOGRAPHY [name] 09/21/2018   Procedure: LEFT HEART CATH AND CORONARY ANGIOGRAPHY;  Surgeon: Leonie Man, MD;  Location: [place] [facility];  Service: Cardiovascular;  Laterality: [name];   LEFT HEART CATH AND CORONARY ANGIOGRAPHY [name] 11/23/2022   Procedure: LEFT HEART CATH AND CORONARY ANGIOGRAPHY;  Surgeon: Sherren Mocha, MD;  Location: [place] [facility];   Service: Cardiovascular;  Laterality: [name];   MASTECTOMY Left 2016   NIPPLE SPARING MASTECTOMY Left 08/06/2015   Procedure: LEFT NIPPLE SPARING MASTECTOMY;  Surgeon: [name], MD;  Location: [place];  Service: General;  Laterality: Left;   ORIF ANKLE FRACTURE Left 10/23/2013   Procedure: OPEN REDUCTION INTERNAL FIXATION (ORIF) LEFT ANKLE FRACTURE;  Surgeon: Marianna Payment, MD;  Location: [place];  Service: Orthopedics;  Laterality: Left;   PARTIAL HYSTERECTOMY     ovaries intact   PARTIAL MASTECTOMY WITH NEEDLE LOCALIZATION AND AXILLARY SENTINEL LYMPH NODE BX Left 05/06/2015   Procedure: LEFT BREAST PARTIAL MASTECTOMY WITH NEEDLE LOCALIZATION AND SENTINEL LYMPH  NODE MAPPING;  Surgeon: [name], MD;  Location: [place];  Service: General;  Laterality: Left;   TONSILLECTOMY     TUBAL LIGATION     Family History  Problem Relation Age of Onset   Cancer Father        stomach    Stomach cancer Father    Colon cancer Father    Heart disease Son        MI   Arthritis Mother    Diabetes Mother    Cancer Brother        lung cancer   Pancreatic cancer Neg Hx    Breast cancer Neg Hx    Esophageal cancer Neg Hx    Rectal cancer Neg Hx    Social History   Socioeconomic History   Marital status: Widowed    Spouse name: Not on file   Number of children: 3   Years of education: Not on file   Highest education level: Not on file  Occupational History   Occupation: retired, previous Engineer, manufacturing systems  Tobacco Use   Smoking status: Former    Types: Cigarettes    Quit date: 04/03/2017    Years since quitting: 5.8   Smokeless tobacco: Former   Tobacco comments:    [place].  Vaping Use   Vaping Use: Never used  Substance and Sexual Activity   Alcohol use: No    Alcohol/week: 0.0 standard drinks of alcohol   Drug use: No   Sexual activity: Never  Other Topics Concern   Not on file  Social History Narrative   From [place].   3  children, one died from MI in 20-Mar-2005   Married 1959-03-21   Husband and brother died in the same week in 2012-11-19      Patient is right handed.   Patient has hs education.   Patient drinks 1 cup of soda daily, and green tea.   Social Determinants of Health   Financial Resource Strain: Low Risk  (02/13/2023)   Overall Financial Resource Strain (CARDIA)    Difficulty of Paying Living Expenses: Not hard at all  Food Insecurity: Food Insecurity Present (02/13/2023)   Hunger Vital Sign    Worried About Running Out of Food in the Last Year: Sometimes true    Ran Out of Food in the Last Year: Sometimes true  Transportation Needs: No Transportation Needs (02/13/2023)   PRAPARE - Hydrologist (Medical): No    Lack of Transportation (Non-Medical): No  Physical Activity: Inactive (02/13/2023)   Exercise Vital Sign    Days of Exercise per Week: 0 days    Minutes of Exercise per Session: 0 min  Stress: No Stress Concern Present (02/13/2023)   [place]    Feeling of Stress : Only a little  Social Connections: Moderately Isolated (02/13/2023)   Social Connection and Isolation Panel [NHANES]    Frequency of Communication with Friends and Family: More than three times a week    Frequency of Social Gatherings with Friends and Family: More than three times a week    Attends Religious Services: More than 4 times per year    Active Member of [employer] or Organizations: No    Attends Archivist Meetings: Never    Marital Status: Widowed    Tobacco Counseling Counseling given: Not Answered Tobacco comments: PT STATES SHE DOES NOT SMOKE.   Clinical  Intake:  Pre-visit preparation completed: Yes  Pain : 0-10 Pain Score: 5  Pain Location: Knee Pain Orientation: Right Pain Descriptors / Indicators: Aching Pain Onset: More than a month ago Pain Relieving Factors: medicatios  Pain Relieving Factors:  medicatios  BMI - recorded: 29.79 Nutritional Status: BMI 25 -29 Overweight Diabetes: Yes CBG done?: No Did pt. bring in CBG monitor from home?: No  How often do you need to have someone help you when you read instructions, pamphlets, or other written materials from your doctor or pharmacy?: 1 - Never  Diabetic?yes  Interpreter Needed?: No  Comments: sister lives her Information entered by :: B.Vale Peraza,LPN   Activities of Daily Living    02/13/2023    2:33 PM  In your present state of health, do you have any difficulty performing the following activities:  Hearing? 1  Vision? 1  Difficulty concentrating or making decisions? 1  Walking or climbing stairs? 1  Dressing or bathing? 0  Doing errands, shopping? 1  Comment daughter helps  Preparing Food and eating ? Y  Using the Toilet? Y  In the past six months, have you accidently leaked urine? Y  Do you have problems with loss of bowel control? N  Managing your Medications? Y  Managing your Finances? Y  Housekeeping or managing your Housekeeping? Y    Patient Care Team: [name], MD as PCP - General [place], Dani Gobble, MD as PCP - Cardiology (Cardiology) [name], MD as Consulting Physician (General Surgery) Crissie Reese, MD as Consulting Physician (Plastic Surgery) Bjorn Loser, MD as Consulting Physician (Urology) Warden Fillers, MD as Consulting Physician (Ophthalmology)  Indicate any recent Medical Services you may have received from other than [facility] providers in the past year (date may be approximate).     Assessment:   This is a routine wellness examination for [place].  Hearing/Vision screen Hearing Screening - Comments:: Inadequate hearing-lft ear Needs hearing test Vision Screening - Comments:: Adequate vision w/glasses Eye injections in rt eye Dr Katy Fitch  Dietary issues and exercise activities discussed: Current Exercise Habits: The patient does not participate in regular exercise  at present, Exercise limited by: cardiac condition(s);orthopedic condition(s)   Goals Addressed   None    Depression Screen    02/13/2023    2:23 PM 02/13/2023   11:09 AM 02/08/2022    2:55 PM 10/27/2020   10:27 AM 12/24/2018    8:32 AM 10/15/2018   11:11 AM 12/09/2016   12:14 PM  PHQ 2/9 Scores  PHQ - 2 Score 4 4 0 3 6 2  0  PHQ- 9 Score 16 17  7 13       Fall Risk    02/13/2023    2:20 PM 02/09/2023    9:12 AM 02/08/2022    2:54 PM 10/27/2020   10:27 AM 10/15/2018   11:11 AM  Fall Risk   Falls in the past year? 1 1 1 1 1   Number falls in past yr: 0 1 1 0 1  Injury with Fall? 0 0 0 0 0  Risk for fall due to : Impaired mobility;Impaired balance/gait;History of fall(s)  Impaired balance/gait Impaired balance/gait   Follow up Education provided;Falls prevention discussed Falls evaluation completed Falls prevention discussed      FALL RISK PREVENTION PERTAINING TO THE HOME:  Any stairs in or around the home? Yes  If so, are there any without handrails? Yes  Home free of loose throw rugs in walkways, pet beds, electrical cords, etc? Yes  Adequate  lighting in your home to reduce risk of falls? Yes   ASSISTIVE DEVICES UTILIZED TO PREVENT FALLS:  Life alert? No  Use of a cane, walker or w/c? Yes cane Grab bars in the bathroom? Yes  Shower chair or bench in shower? No  Elevated toilet seat or a handicapped toilet? No    Cognitive Function:    12/09/2016   12:21 PM  MMSE - Mini Mental State Exam  Orientation to time 5  Orientation to Place 5  Registration 3  Attention/ Calculation 0  Recall 3  Language- name 2 objects 0  Language- repeat 1  Language- follow 3 step command 3  Language- read & follow direction 0  Write a sentence 0  Copy design 0  Total score 20        02/13/2023    2:41 PM 02/08/2022    2:57 PM  6CIT Screen  What Year? 0 points 0 points  What month? 0 points 0 points  What time? 3 points 0 points  Count back from 20 4 points 4 points  Months  in reverse 4 points 4 points  Repeat phrase 10 points 4 points  Total Score 21 points 12 points    Immunizations Immunization History  Administered Date(s) Administered   Fluad Quad(high Dose 65+) 10/27/2020, 10/04/2021, 08/17/2022   Influenza Split 10/11/2011, 09/27/2012   Influenza Whole 09/16/2008   Influenza,inj,Quad PF,6+ Mos 09/18/2013, 09/29/2014, 08/25/2015, 08/17/2016, 07/27/2017, 10/15/2018, 07/25/2019   PFIZER(Purple Top)SARS-COV-2 Vaccination 01/05/2020, 01/26/2020, 10/31/2020, 09/13/2021   Pneumococcal Conjugate-13 07/04/2014   Pneumococcal Polysaccharide-23 05/19/2011   Td 01/30/2007    TDAP status: Up to date  Flu Vaccine status: Up to date  Pneumococcal vaccine status: Up to date  Covid-19 vaccine status: Completed vaccines  Qualifies for Shingles Vaccine? Yes   Zostavax completed No   Shingrix Completed?: No.    Education has been provided regarding the importance of this vaccine. Patient has been advised to call insurance company to determine out of pocket expense if they have not yet received this vaccine. Advised may also receive vaccine at local pharmacy or Health Dept. Verbalized acceptance and understanding.  Screening Tests Health Maintenance  Topic Date Due   Diabetic kidney evaluation - Urine ACR  Never done   Zoster Vaccines- Shingrix (1 of 2) Never done   DTaP/Tdap/Td (2 - Tdap) 01/29/2017   COVID-19 Vaccine (5 - 2023-24 season) 07/29/2022   COLONOSCOPY (Pts 45-27yrs Insurance coverage will need to be confirmed)  10/01/2022   OPHTHALMOLOGY EXAM  01/05/2023   FOOT EXAM  02/08/2023   MAMMOGRAM  05/25/2023   HEMOGLOBIN A1C  05/25/2023   Diabetic kidney evaluation - eGFR measurement  12/02/2023   Medicare Annual Wellness (AWV)  02/13/2024   Pneumonia Vaccine 37+ Years old  Completed   INFLUENZA VACCINE  Completed   DEXA SCAN  Completed   HPV [place] Maintenance Due  Topic Date Due   Diabetic kidney  evaluation - Urine ACR  Never done   Zoster Vaccines- Shingrix (1 of 2) Never done   DTaP/Tdap/Td (2 - Tdap) 01/29/2017   COVID-19 Vaccine (5 - 2023-24 season) 07/29/2022   COLONOSCOPY (Pts 45-44yrs Insurance coverage will need to be confirmed)  10/01/2022   OPHTHALMOLOGY EXAM  01/05/2023   FOOT EXAM  02/08/2023    Colorectal cancer screening: Type of screening: Colonoscopy. Completed yes. Repeat every no longer needed years  Mammogram status: Completed yes. Repeat every year  Bone  Density status: Completed yes. Results reflect: Bone density results: OSTEOPENIA. Repeat every 5 years.  Lung Cancer Screening: (Low Dose CT Chest recommended if Age 29-80 years, 30 pack-year currently smoking OR have quit w/in 15years.) does not qualify.   Lung Cancer Screening Referral: no  Additional Screening:  Hepatitis C Screening: does not qualify; Completed yes  Vision Screening: Recommended annual ophthalmology exams for early detection of glaucoma and other disorders of the eye. Is the patient up to date with their annual eye exam?  Yes  Who is the provider or what is the name of the office in which the patient attends annual eye exams? Dr Katy Fitch If pt is not established with a provider, would they like to be referred to a provider to establish care? No .   Dental Screening: Recommended annual dental exams for proper oral hygiene  Community Resource Referral / Chronic Care Management: CRR required this visit?  No   CCM required this visit?  No      Plan:     I have personally reviewed and noted the following in the patient's chart:   Medical and social history Use of alcohol, tobacco or illicit drugs  Current medications and supplements including opioid prescriptions. Patient is not currently taking opioid prescriptions. Functional ability and status Nutritional status Physical activity Advanced directives List of other physicians Hospitalizations, surgeries, and ER visits in  previous 12 months Vitals Screenings to include cognitive, depression, and falls Referrals and appointments  In addition, I have reviewed and discussed with patient certain preventive protocols, quality metrics, and best practice recommendations. A written personalized care plan for preventive services as well as general preventive health recommendations were provided to patient.     Roger Shelter, LPN   [id number]   Nurse Notes: pt doing well at her baseline. She is having problems with pain and fluid on her knees right now. In office for such this morning.  Pt having problems hearing in left ear and desires hearing test. Pt fair recall and inability to answer/remember some of the questions. Her daughter in the room and answers. Pt daughter does question pt getting a life alert that she will use. I instructed to look on back of insurance card and get information from customer service for options. She concurs she will No further questions or concerns

## 2023-02-19 ENCOUNTER — Telehealth: Payer: Self-pay | Admitting: Family Medicine

## 2023-02-19 DIAGNOSIS — I82409 Acute embolism and thrombosis of unspecified deep veins of unspecified lower extremity: Secondary | ICD-10-CM

## 2023-02-19 NOTE — Telephone Encounter (Signed)
Please check with patient/family.   Reasonable to recheck ultrasound given history of DVT/PE.  I ordered it and she should get a call about scheduling.  Let me know if she doesn't get a call.    My understanding is that she will likely have significant dental work done in April.  We need to see about stopping anticoagulation versus Lovenox bridge.  She had PE/DVT at the end of December 2023 with plan for 3-6 months anticoagulation.    Thanks.

## 2023-02-20 NOTE — Telephone Encounter (Signed)
Spoke with patients daughter [name] and she already scheduled [place] for 03/16/23. I asked about the dental work and right now it is scheduled for sometime in may.

## 2023-02-21 NOTE — Telephone Encounter (Signed)
Noted.  Please have the dental clinic contact us 2 weeks ahead of time so we can make arrangements if needed re: eliquis.  Thanks.

## 2023-02-22 ENCOUNTER — Ambulatory Visit: Payer: 59 | Admitting: Audiologist

## 2023-02-27 ENCOUNTER — Ambulatory Visit: Payer: 59 | Attending: Audiologist | Admitting: Audiologist

## 2023-02-27 DIAGNOSIS — H6502 Acute serous otitis media, left ear: Secondary | ICD-10-CM | POA: Diagnosis not present

## 2023-02-27 NOTE — Procedures (Signed)
  Outpatient Audiology and [place],   [phone]  AUDIOLOGICAL  EVALUATION  NAME: Kelly Kennedy     DOB:   12-03-1940      MRN: [id number]                                                                                     DATE: 02/27/2023     REFERENT: [name], MD STATUS: Outpatient DIAGNOSIS: Otitis Media Left Ear    History: Kelly Kennedy was seen for an audiological evaluation due to difficulty hearing and pressure in left ear. Kelly Kennedy is accompanied by her daughter. Kelly Kennedy is having memory slips per daughter. Kelly Kennedy says she has had difficulty hearing from the left ear for about two months. She feels that ear is stopped up. She used to work in [employer] and was exposed to lots of noise. She feels she hears well from the right ear. No other case history reported.   Evaluation:  Otoscopy showed a clear view of the tympanic membranes, bilaterally. Right eardrum looks normal. Left eardrum bulging and white. Tympanometry results were consistent with normal response in right ear and flat response in left ear Audiometric testing was completed using Conventional Audiometry techniques with insert earphones and TDH headphones. Test results are consistent with normal hearing 250-4kHz sloping to mild sensorineural hearing loss 6-8kHz bilaterally. Speech Recognition Thresholds were obtained at  25dB HL in the right ear and at 20  dB HL in the left ear. Word Recognition Testing was completed at 40dB SL and Kelly Kennedy scored 100% in each ear.   Results:  The test results were reviewed with Kelly Kennedy and her daughter. Left eardrum is white and bulging. Her left ear has abnormal pressure. Follow up with physician due to evidence of otitis media in left ear. She has normal hearing through [facility] in both ears sloping to a mild loss 6-8kHz, hearing aids not necessary at this time.    Recommendations: Follow up with PCP about otitis media  in left ear.  No hearing aids necessary at this time.   28 minutes spent testing and counseling on results.   If you have any questions please feel free to contact me at [phone].  Bari Mantis Audiologist, Au.D., CCC-A 02/27/2023  2:15 PM  Cc: [name], MD

## 2023-03-01 ENCOUNTER — Telehealth: Payer: Self-pay | Admitting: Family Medicine

## 2023-03-01 MED ORDER — MOMETASONE FUROATE 50 MCG/ACT NA SUSP: 2.0000 | Freq: Every day | NASAL | 2 refills | Status: DC

## 2023-03-01 NOTE — Telephone Encounter (Signed)
Spoke with patients daughter [name] (okay per DPR). Advised her to have patient try nasonex and that rx was sent in. She will let us know if she can not tolerate it.

## 2023-03-01 NOTE — Telephone Encounter (Signed)
Please update patient/family.  I saw the audiology note. Left eardrum is white and bulging.  This could cause symptoms/affect her hearing.  I realize she could not tolerate Flonase because of the smell of that medicine.  She may be able to tolerate Nasonex.  I sent a prescription to that the pharmacy. [id number] - [place],  - [address] AT [place].  I would try using that in the meantime, to see if she can tolerate it.  Thanks.

## 2023-03-08 DIAGNOSIS — H34831 Tributary (branch) retinal vein occlusion, right eye, with macular edema: Secondary | ICD-10-CM | POA: Diagnosis not present

## 2023-03-08 NOTE — Progress Notes (Unsigned)
Cardiology Clinic Note   Patient Name: Kelly Kennedy Date of Encounter: 03/09/2023  Primary Care Provider:  Joaquim Nam, MD Primary Cardiologist:  Thurmon Fair, MD  Patient Profile    Kelly Kennedy 82 year old female presents the clinic today for follow-up evaluation of her  coronary artery disease.  Past Medical History    Past Medical History:  Diagnosis Date   adenomatous Colon polyps    colonoscopy 8/99, 12/02, 8/06   Arthritis    Bell's palsy    Cancer    DCIS left breast   Cataract    Chest pain    uses NTG as needed   Depression    Diabetes mellitus without complication    Diverticulosis of colon 06/2005   Ductal carcinoma in situ (DCIS) of left breast    Family history of adverse reaction to anesthesia    " my mother didn't wake up so they had to put her on life support for about an hour or more; my sisiter has problems waking up too from anesthesia.   GERD (gastroesophageal reflux disease)    Glaucoma    H/O hiatal hernia    Headache(784.0)    Hyperglycemia    Hyperlipidemia    Hypertension    Hypothyroid    Insomnia    Irritable bowel    Menopausal symptoms    Pericarditis 1980's   Stroke    TIA (transient ischemic attack)    09/2014   Past Surgical History:  Procedure Laterality Date   APPENDECTOMY     BREAST EXCISIONAL BIOPSY Right    benign   BREAST RECONSTRUCTION WITH PLACEMENT OF TISSUE EXPANDER AND FLEX HD (ACELLULAR HYDRATED DERMIS) Left 08/06/2015   Procedure: LEFT BREAST RECONSTRUCTION WITH PLACEMENT OF SALINE IMPLANT AND ACELLULAR DURMAL MATRIX;  Surgeon: Etter Sjogren, MD;  Location: [facility] OR;  Service: Plastics;  Laterality: Left;   CARDIAC CATHETERIZATION     CATARACT EXTRACTION Bilateral 2016   CHOLECYSTECTOMY  1990   COLONOSCOPY     CYSTECTOMY  05/04/2008   left lower arm, benign   ECTOPIC PREGNANCY SURGERY  1971   FRACTURE SURGERY     nose   LEFT HEART CATH AND CORONARY ANGIOGRAPHY [name] 09/21/2018    Procedure: LEFT HEART CATH AND CORONARY ANGIOGRAPHY;  Surgeon: Marykay Lex, MD;  Location: [facility] INVASIVE CV LAB;  Service: Cardiovascular;  Laterality: [name];   LEFT HEART CATH AND CORONARY ANGIOGRAPHY [name] 11/23/2022   Procedure: LEFT HEART CATH AND CORONARY ANGIOGRAPHY;  Surgeon: Tonny Bollman, MD;  Location: [facility] INVASIVE CV LAB;  Service: Cardiovascular;  Laterality: [name];   MASTECTOMY Left 2016   NIPPLE SPARING MASTECTOMY Left 08/06/2015   Procedure: LEFT NIPPLE SPARING MASTECTOMY;  Surgeon: Harriette Bouillon, MD;  Location: [facility] OR;  Service: General;  Laterality: Left;   ORIF ANKLE FRACTURE Left 10/23/2013   Procedure: OPEN REDUCTION INTERNAL FIXATION (ORIF) LEFT ANKLE FRACTURE;  Surgeon: Cheral Almas, MD;  Location: [facility] OR;  Service: Orthopedics;  Laterality: Left;   PARTIAL HYSTERECTOMY     ovaries intact   PARTIAL MASTECTOMY WITH NEEDLE LOCALIZATION AND AXILLARY SENTINEL LYMPH NODE BX Left 05/06/2015   Procedure: LEFT BREAST PARTIAL MASTECTOMY WITH NEEDLE LOCALIZATION AND SENTINEL LYMPH NODE MAPPING;  Surgeon: Harriette Bouillon, MD;  Location: [facility];  Service: General;  Laterality: Left;   TONSILLECTOMY     TUBAL LIGATION      Allergies  Allergies  Allergen Reactions   Ace Inhibitors Swelling    H/o  lip swelling with change in losartan pill   Angiotensin Receptor Blockers Swelling    H/o lip swelling with change in losartan pill   Statins Anaphylaxis    myalgias   Flonase [Fluticasone Propionate] Other (See Comments)    Intolerant of smell   Glipizide Other (See Comments)    unstready when on med.     Iron Other (See Comments)    Bloating with oral iron   Sulfa Antibiotics Other (See Comments)    Intolerant of sulfa but able to tolerate trimethoprim   Niacin Rash   Red Dye Rash    History of Present Illness    Kelly Kennedy has a PMH of essential hypertension, coronary artery disease, PVCs, GERD, fatty liver, dysphagia, hypothyroidism, obesity,  anxiety, and hyperlipidemia.  Her PMH also includes nonsustained atrial tachycardia, previous TIA, and probable previous pericarditis.  Coronary CT 10/19 showed high-grade stenosis in her mid left circumflex with FFR of 0.75.  This showed a calcium score of 274.  Cardiac catheterization did not confirm the presence of single severe stenosis of circumflex artery.  She was noted to have 45% stenosed distal circumflex lesion.  LVEF was 65%.  She was seen by Dr. Royann Shivers on 10/18/2021.  She reported taking occasional sublingual nitroglycerin for unpredictable episodes of chest tightness.  The nitroglycerin helps her symptoms resolve however, it takes about 15 minutes before the discomfort to go away.  She notices discomfort with doing housework like mopping or vacuuming.  She notes that her diastolic blood pressure at home is oftentimes in the 40s.  Her blood pressure in the office during her visit was 140/80.  She denied chest pain and dizziness.  She presented to the clinic 06/02/22 for follow-up evaluation stated she had not noticed any chest discomfort while doing increased physical activity.  She did note occasional chest discomfort after eating.   She did use occasional sublingual nitroglycerin which helps her chest discomfort dissipate after about 15-20 minutes.   Her daughter presented with her and indicated that she ate fried food regularly.  We reviewed her previous coronary CTA and cholesterol.  Her cholesterol was fairly well controlled with an LDL cholesterol of 57 on 10/04/2021.  We reviewed the importance of high-fiber diet.  I ordered a coronary CTA, have her increase her physical activity as tolerated, and plan follow-up for 6 months.  Coronary CTA was not completed.  She presented to the emergency department on 11/22/2022 and was complaining of dyspnea and chest discomfort.  She received nitroglycerin and 3 aspirin.  Her blood pressure was noted to be significantly elevated at 262/120.  Her initial  troponin came back elevated at 199 and on recheck was 353.  She was started on IV heparin.  Her chest x-ray showed no acute abnormalities.  Her EKG showed sinus tachycardia with inferior and lateral T wave inversion.  She underwent repeat cardiac catheterization 11/23/2022 which showed patent LAD with mild nonobstructive plaque, dominant circumflex with mild nonobstructive plaque and nondominant RCA without significant stenosis.  Medical management was recommended.  She presented to the clinic 12/02/2022 for follow-up evaluation stated she felt well .  She denied chest pain.  She had not been very active since the catheterization.  We reviewed her medications and her DVT as well as PE.  She reported compliance with her apixaban.   Her blood pressure initially was 181/91 and on recheck was 136/66.  We reviewed her angiography results and they expressed understanding.  I asked her to gradually  increase her physical activity, continue her current medications and diet and we  planned follow-up in 3 to 4 months.  She presents to the clinic today for follow-up evaluation and states she feels well today.  She reports that she was at the eye doctor yesterday and also had an injection.  She did notice some chest discomfort in the evening it was nonexertional.  She did take 1 sublingual nitroglycerin and her pain resolved.  She denies any further episodes of pain today.  She has been somewhat sedentary due to her right knee pain.  She did receive an injection in her knee and her knee is much better.  I encouraged her to increase her physical activity.  Her blood pressure is well-controlled today.  She presents with her daughter.  Her daughter reports a decreased appetite.  She has lost around 8 pounds.  I encouraged her to increase her calories in her diet and we will plan 6 months follow-up.  Today she denies chest pain, shortness of breath, lower extremity edema, fatigue, palpitations, melena, hematuria, hemoptysis,  diaphoresis, weakness, presyncope, syncope, orthopnea, and PND.   Home Medications    Prior to Admission medications   Medication Sig Start Date End Date Taking? Authorizing Provider  amitriptyline (ELAVIL) 50 MG tablet Take 1-2 tablets (50-100 mg total) by mouth at bedtime as needed. for sleep 02/07/22   Joaquim Nam, MD  amLODipine (NORVASC) 5 MG tablet Take 5 mg by mouth daily. 04/24/21   [provider]  aspirin EC 81 MG tablet Take 1 tablet (81 mg total) by mouth daily. Swallow whole. 02/12/21   Croitoru, Mihai, MD  carvedilol (COREG) 12.5 MG tablet TAKE 1 TABLET BY MOUTH 2 TIMES DAILY. 02/14/22   Croitoru, Mihai, MD  cyanocobalamin (,VITAMIN B-12,) 1000 MCG/ML injection Inject 1 mL (1,000 mcg total) into the muscle every 30 (thirty) days. 07/07/21   Joaquim Nam, MD  diclofenac Sodium (VOLTAREN) 1 % GEL Apply 2 g topically 4 (four) times daily as needed. 10/04/21   Joaquim Nam, MD  donepezil (ARICEPT ODT) 5 MG disintegrating tablet TAKE 1 TABLET BY MOUTH EVERYDAY AT BEDTIME 03/16/22   Joaquim Nam, MD  fluocinonide cream (LIDEX) 0.05 % APPLY TOPICALLY DAILY AS NEEDED. USE SPARINGLY. 02/13/22   Joaquim Nam, MD  isosorbide mononitrate (IMDUR) 30 MG 24 hr tablet TAKE 1 TABLET BY MOUTH EVERY DAY 02/22/22   Croitoru, Mihai, MD  levothyroxine (SYNTHROID) 75 MCG tablet TAKE 1 TABLET BY MOUTH DAILY BEFORE BREAKFAST. 12/24/21   Joaquim Nam, MD  Lifitegrast Benay Spice) 5 % SOLN Apply 1 drop to eye in the morning and at bedtime. Twice eyes    [provider]  LUMIGAN 0.01 % SOLN Place 1 drop into both eyes at bedtime. 06/29/19   [provider]  nitroGLYCERIN (NITROSTAT) 0.4 MG SL tablet Place 1 tablet (0.4 mg total) under the tongue every 5 (five) minutes as needed for chest pain. 02/12/21   Croitoru, Mihai, MD  omeprazole (PRILOSEC) 40 MG capsule TAKE 1 CAPSULE (40 MG TOTAL) BY MOUTH DAILY. 02/14/22   Meryl Dare, MD  REPATHA SURECLICK 140 MG/ML SOAJ  ADMINISTER 1 ML UNDER THE SKIN EVERY 14 DAYS 09/14/21   Croitoru, Rachelle Hora, MD  solifenacin (VESICARE) 5 MG tablet Take 1 tablet (5 mg total) by mouth daily. 10/04/21   Joaquim Nam, MD    Family History    Family History  Problem Relation Age of Onset   Cancer Father  stomach    Stomach cancer Father    Colon cancer Father    Heart disease Son        MI   Arthritis Mother    Diabetes Mother    Cancer Brother        lung cancer   Pancreatic cancer Neg Hx    Breast cancer Neg Hx    Esophageal cancer Neg Hx    Rectal cancer Neg Hx    She indicated that her mother is deceased. She indicated that her father is deceased. She indicated that her brother is deceased. She indicated that her son is deceased. She indicated that the status of her neg hx is unknown.  Social History    Social History   Socioeconomic History   Marital status: Widowed    Spouse name: Not on file   Number of children: 3   Years of education: Not on file   Highest education level: Not on file  Occupational History   Occupation: retired, previous Scientist, product/process development  Tobacco Use   Smoking status: Former    Types: Cigarettes    Quit date: 04/03/2017    Years since quitting: 5.9   Smokeless tobacco: Former   Tobacco comments:    PT STATES SHE DOES NOT SMOKE.  Vaping Use   Vaping Use: Never used  Substance and Sexual Activity   Alcohol use: No    Alcohol/week: 0.0 standard drinks of alcohol   Drug use: No   Sexual activity: Never  Other Topics Concern   Not on file  Social History Narrative   From [place].   3 children, one died from MI in 04-14-2005   Married 1959-04-15   Husband and brother died in the same week in 14-Nov-2012      Patient is right handed.   Patient has hs education.   Patient drinks 1 cup of soda daily, and green tea.   Social Determinants of Health   Financial Resource Strain: Low Risk  (02/13/2023)   Overall Financial Resource Strain (CARDIA)    Difficulty of Paying Living  Expenses: Not hard at all  Food Insecurity: Food Insecurity Present (02/13/2023)   Hunger Vital Sign    Worried About Running Out of Food in the Last Year: Sometimes true    Ran Out of Food in the Last Year: Sometimes true  Transportation Needs: No Transportation Needs (02/13/2023)   PRAPARE - Administrator, Civil Service (Medical): No    Lack of Transportation (Non-Medical): No  Physical Activity: Inactive (02/13/2023)   Exercise Vital Sign    Days of Exercise per Week: 0 days    Minutes of Exercise per Session: 0 min  Stress: No Stress Concern Present (02/13/2023)   [employer] of Occupational Health - Occupational Stress Questionnaire    Feeling of Stress : Only a little  Social Connections: Moderately Isolated (02/13/2023)   Social Connection and Isolation Panel [NHANES]    Frequency of Communication with Friends and Family: More than three times a week    Frequency of Social Gatherings with Friends and Family: More than three times a week    Attends Religious Services: More than 4 times per year    Active Member of [employer] or Organizations: No    Attends Banker Meetings: Never    Marital Status: Widowed  Intimate Partner Violence: Not At Risk (02/13/2023)   Humiliation, Afraid, Rape, and Kick questionnaire    Fear of Current or Ex-Partner: No  Emotionally Abused: No    Physically Abused: No    Sexually Abused: No     Review of Systems    General:  No chills, fever, night sweats or weight changes.  Cardiovascular:  No chest pain, dyspnea on exertion, edema, orthopnea, palpitations, paroxysmal nocturnal dyspnea. Dermatological: No rash, lesions/masses Respiratory: No cough, dyspnea Urologic: No hematuria, dysuria Abdominal:   No nausea, vomiting, diarrhea, bright red blood per rectum, melena, or hematemesis Neurologic:  No visual changes, wkns, changes in mental status. All other systems reviewed and are otherwise negative except as noted  above.  Physical Exam    VS:  BP 134/84   Pulse 84   Ht 5\' 5"  (1.651 m)   Wt 171 lb (77.6 kg)   SpO2 97%   BMI 28.46 kg/m  , BMI Body mass index is 28.46 kg/m. GEN: Well nourished, well developed, in no acute distress. HEENT: normal. Neck: Supple, no JVD, carotid bruits, or masses. Cardiac: RRR, no murmurs, rubs, or gallops. No clubbing, cyanosis, generalized bilateral lower extremity nonpitting edema.  Radials/DP/PT 2+ and equal bilaterally.  Respiratory:  Respirations regular and unlabored, clear to auscultation bilaterally. GI: Soft, nontender, nondistended, BS + x 4. MS: no deformity or atrophy. Skin: warm and dry, no rash. Neuro:  Strength and sensation are intact. Psych: Normal affect.  Accessory Clinical Findings    Recent Labs: 11/22/2022: Magnesium 1.9 11/23/2022: TSH 1.322 12/01/2022: ALT 11; BUN 8; Creatinine, Ser 0.82; Hemoglobin 11.5; Platelets 372.0; Potassium 3.8; Sodium 141   Recent Lipid Panel    Component Value Date/Time   CHOL 139 11/23/2022 0510   CHOL 121 10/30/2019 0915   TRIG 67 11/23/2022 0510   TRIG 203 10/03/2012 0000   HDL 74 11/23/2022 0510   HDL 72 10/30/2019 0915   CHOLHDL 1.9 11/23/2022 0510   VLDL 13 11/23/2022 0510   LDLCALC 52 11/23/2022 0510   LDLCALC 26 10/30/2019 0915   LDLDIRECT 154.0 10/27/2017 0756    ECG personally reviewed by me today-none today.  EKG 06/02/2022 normal sinus rhythm LVH possible lateral infarct undetermined age 77 bpm- No acute changes  Cardiac catheterization 09/21/2018 Dist Cx lesion is 45% stenosed -angiographically moderate at best disease. Does not explain CT scan. There is hyperdynamic left ventricular systolic function. The left ventricular ejection fraction is greater than 65% by visual estimate. LV end diastolic pressure is normal.   Mild to moderate disease distal dominant Circumflex.  There is no culprit lesion to explain CT scan findings. Otherwise essentially normal coronary arteries and a left  dominant system. Normal LVEF with hyperdynamic function.  The patient had a brief run of PAT/SVT during injection.   Patient will return to short stay holding area for ongoing care. Would recommend beta-blocker.  The patient felt palpitations with PVCs and brief runs of PAT with left ventricular hemodynamics and LV gram.  She describes her chest discomfort as the symptoms noted with documented ectopy and brief PAT runs.   She will be ready for discharge home after bedrest.   Recommend Aspirin 81mg  daily for moderate CAD.      Bryan Lemma, MD  Diagnostic Dominance: Left  Intervention    Left Heart Catheterization 11/23/22 Patent left main with no significant stenosis Patent LAD with mild non-obstructive plaque at the first diagonal Patent, dominant LCx with mild nonobstructive plaque in the proximal and mid-vessel Nondominant RCA without significant stenosis Normal LVEDP   Recommend: medical therapy for nonobstructive CAD. Consider non-cardiac symptoms.  Diagnostic Dominance: Left  Echocardiogram 11/23/22  1. Left ventricular ejection fraction, by estimation, is 60 to 65%. The  left ventricle has normal function. The left ventricle has no regional  wall motion abnormalities. There is moderate asymmetric left ventricular  hypertrophy of the basal-septal  segment. Left ventricular diastolic parameters are consistent with Grade I  diastolic dysfunction (impaired relaxation).   2. Right ventricular systolic function is normal. The right ventricular  size is normal.   3. The mitral valve is grossly normal. Trivial mitral valve  regurgitation. No evidence of mitral stenosis.   4. The aortic valve is tricuspid. There is mild calcification of the  aortic valve. There is mild thickening of the aortic valve. Aortic valve  regurgitation is not visualized. Aortic valve sclerosis/calcification is  present, without any evidence of  aortic stenosis.   5. The inferior vena cava is  dilated in size with <50% respiratory  variability, suggesting right atrial pressure of 15 mmHg.   Comparison(s): Compared to prior TTE report in 2016, the LVEF appears more  robust (previously reported as 50-55%). Otherwise, there is no significant  change.   Assessment & Plan   1.  Coronary artery disease-without chest pain today.  Reports that she did take a nitroglycerin last night.  Pain was atypical and not with exertion.  Appears to be related to GI issue.  Cardiac catheterization 10/2722 and was noted to have mild nonobstructive plaque.  Medical management was recommended Continue amlodipine, carvedilol, Imdur, nitroglycerin, Repatha Heart healthy low-sodium diet-reviewed Maintain physical activity  Hyperlipidemia-11/23/2022: Cholesterol 139; HDL 74; LDL Cholesterol 52; Triglycerides 67; VLDL 13 Continue Repatha Heart healthy low-sodium high-fiber diet Increase physical activity gradually Repeat fasting lipids and LFTs 12/24    Essential hypertension-BP today 134/84.   Maintain blood pressure log Continue amlodipine, carvedilol, Imdur Heart healthy low-sodium diet-salty 6 given Increase physical activity gradually  Type 2 diabetes-glucose 85 on 12/01/22 Heart healthy low-sodium carb modified diet Follows with PCP  Disposition: Follow-up with Dr. Royann Shiversroitoru  in 4-6 months.     Thomasene RippleJesse M. Zimal Weisensel NP-C     03/09/2023, 4:10 PM [facility] [address] Office [phone] Fax [phone]  Notice: This dictation was prepared with Dragon dictation along with smaller phrase technology. Any transcriptional errors that result from this process are unintentional and may not be corrected upon review.  I spent 14 minutes examining this patient, reviewing medications, and using patient centered shared decision making involving her cardiac care.  Prior to her visit I spent greater than 20 minutes reviewing her past medical history,   medications, and prior cardiac tests.

## 2023-03-09 ENCOUNTER — Encounter: Payer: Self-pay | Admitting: General Practice

## 2023-03-09 ENCOUNTER — Ambulatory Visit: Payer: 59 | Attending: General Practice | Admitting: General Practice

## 2023-03-09 VITALS — BP 134/84 | HR 84 | Ht 65.0 in | Wt 171.0 lb

## 2023-03-09 DIAGNOSIS — I10 Essential (primary) hypertension: Secondary | ICD-10-CM

## 2023-03-09 DIAGNOSIS — E7800 Pure hypercholesterolemia, unspecified: Secondary | ICD-10-CM

## 2023-03-09 DIAGNOSIS — I25118 Atherosclerotic heart disease of native coronary artery with other forms of angina pectoris: Secondary | ICD-10-CM

## 2023-03-09 DIAGNOSIS — E119 Type 2 diabetes mellitus without complications: Secondary | ICD-10-CM

## 2023-03-09 NOTE — Patient Instructions (Signed)
Medication Instructions:  The current medical regimen is effective;  continue present plan and medications as directed. Please refer to the Current Medication list given to you today.  *If you need a refill on your cardiac medications before your next appointment, please call your pharmacy*  Lab Work: FASTING LIPID AND LFT IN Onset If you have labs (blood work) drawn today and your tests are completely normal, you will receive your results only by:  MyChart Message (if you have MyChart) OR  A paper copy in the mail If you have any lab test that is abnormal or we need to change your treatment, we will call you to review the results.  Testing/Procedures: NONE  Follow-Up: At [facility], you and your health needs are our priority.  As part of our continuing mission to provide you with exceptional heart care, we have created designated Provider Care Teams.  These Care Teams include your primary Cardiologist (physician) and Advanced Practice Providers (APPs -  Physician Assistants and Nurse Practitioners) who all work together to provide you with the care you need, when you need it.  Your next appointment:   6 month(s)  Provider:   Thurmon Fair, MD  or Edd Fabian, FNP        Other Instructions INCREASE PHYSICAL ACTIVITY AS TOLERATED-WALKING MOST DAYS OF THE WEEK

## 2023-03-13 ENCOUNTER — Other Ambulatory Visit (INDEPENDENT_AMBULATORY_CARE_PROVIDER_SITE_OTHER): Payer: 59

## 2023-03-13 DIAGNOSIS — I214 Non-ST elevation (NSTEMI) myocardial infarction: Secondary | ICD-10-CM

## 2023-03-15 ENCOUNTER — Ambulatory Visit (INDEPENDENT_AMBULATORY_CARE_PROVIDER_SITE_OTHER): Payer: 59

## 2023-03-15 DIAGNOSIS — E538 Deficiency of other specified B group vitamins: Secondary | ICD-10-CM | POA: Diagnosis not present

## 2023-03-15 MED ORDER — CYANOCOBALAMIN 1000 MCG/ML IJ SOLN
1000.0000 ug | Freq: Once | INTRAMUSCULAR | Status: AC
  Administered 2023-03-15: 1000 ug via INTRAMUSCULAR

## 2023-03-15 NOTE — Progress Notes (Signed)
Per orders of Dr. Para March, injection of monthly B12 1000 mcg per ml given by Eual Fines, CMA in Right Deltoid. Patient tolerated injection well.  Advised pt to set up next appointment on the way out.  Forwarding to Dr Patsy Lager to sign in Dr Lianne Bushy absence.

## 2023-03-16 ENCOUNTER — Ambulatory Visit
Admission: RE | Admit: 2023-03-16 | Discharge: 2023-03-16 | Disposition: A | Discharge status: Home / Self Care | Payer: 59 | Source: Ambulatory Visit | Attending: Family Medicine | Admitting: Family Medicine

## 2023-03-16 ENCOUNTER — Other Ambulatory Visit: Payer: 59

## 2023-03-16 DIAGNOSIS — I872 Venous insufficiency (chronic) (peripheral): Secondary | ICD-10-CM | POA: Diagnosis not present

## 2023-03-16 DIAGNOSIS — I82409 Acute embolism and thrombosis of unspecified deep veins of unspecified lower extremity: Secondary | ICD-10-CM

## 2023-03-16 DIAGNOSIS — I2699 Other pulmonary embolism without acute cor pulmonale: Secondary | ICD-10-CM | POA: Diagnosis not present

## 2023-03-27 ENCOUNTER — Ambulatory Visit: Payer: Self-pay

## 2023-03-27 NOTE — Patient Outreach (Signed)
  Care Coordination   Follow Up Visit Note   03/27/2023 Name: Kelly Kennedy MRN: [id number] DOB: [birth year]  Kelly Kennedy is a 82 y.o. year old female who sees Joaquim Nam, MD for primary care. I spoke with  Leshay Maisie Fus by phone today.  What matters to the patients health and wellness today?  Patient reports she is doing well. She states her knee pain has decreased. She states her knee pain is a 3 on scale 10 point scale. Patient states she continues to have mild chest pain but no shortness of breath. She states her doctor is aware.    She states she uses her nitroglycerin when needed. Patient states she was able to cut her grass last weekend and she walks around inside her home for 30 minutes 3-4 times per week.    Goals Addressed             This Visit's Progress    Develop plan of care for management of coronary artery disease       Interventions Today    Flowsheet Row Most Recent Value  Chronic Disease   Chronic disease during today's visit Other  [coronary artery disease]  General Interventions   General Interventions Discussed/Reviewed General Interventions Reviewed, Doctor Visits  [evaulation of current treatment plan for CAD and patients adherence to plan as determined by provider.  Assessed for symptoms of increase SOB, chest pain]  Doctor Visits Discussed/Reviewed Doctor Visits Reviewed  [reviewed scheduled/ upcoming provider visits]  Exercise Interventions   Exercise Discussed/Reviewed Physical Activity  [Encouraged patient to continue her physical activty daily.]  Physical Activity Discussed/Reviewed Physical Activity Reviewed  [Discussed importance of maining physical activity.]  Education Interventions   Education Provided Provided Education  [Advised to monitor blood pressure daily and keep BP log as recommended by cardiologist.] Advised to notify provider of mild / moderate symptoms  Call 911 for severe symptoms.   Pharmacy Interventions    Pharmacy Dicussed/Reviewed Pharmacy Topics Reviewed  [medications reviewed and compliance discussed. Advised patient to have daughter follow up with pharmacy regarding medication refill.]            COMPLETED: management of knee pain       Interventions Today    Flowsheet Row Most Recent Value  Chronic Disease   Chronic disease during today's visit Other  [knee pain]  General Interventions   General Interventions Discussed/Reviewed General Interventions Reviewed  [Assessed knee pain.]  Doctor Visits Discussed/Reviewed Doctor Visits Reviewed  Annabell Sabal upcoming/ scheduled provider visit.]  Exercise Interventions   Exercise Discussed/Reviewed Physical Activity  [Encouraged patient to continue her physical activty daily.]  Physical Activity Discussed/Reviewed Physical Activity Reviewed  [Discussed importance of maining physical activity.]  Education Interventions   Education Provided Provided Education  [Advised to monitor blood pressure daily and keep BP log as recommended by cardiologist.]  Provided Verbal Education On Other  [patient advised to elevate leg when sitting to help with swelling. Advised patient to wear compression hose as recommended.]  Pharmacy Interventions   Pharmacy Dicussed/Reviewed Pharmacy Topics Reviewed  [medications reviewed and compliance discussed.]              SDOH assessments and interventions completed:  No     Care Coordination Interventions:  Yes, provided   Follow up plan: Follow up call scheduled for 04/26/23    Encounter Outcome:  Pt. Visit Completed

## 2023-03-28 ENCOUNTER — Encounter: Payer: Self-pay | Admitting: Family Medicine

## 2023-03-28 ENCOUNTER — Ambulatory Visit (INDEPENDENT_AMBULATORY_CARE_PROVIDER_SITE_OTHER): Payer: 59 | Admitting: Family Medicine

## 2023-03-28 VITALS — BP 136/80 | HR 91 | Temp 97.6°F | Ht 65.0 in | Wt 173.0 lb

## 2023-03-28 DIAGNOSIS — E119 Type 2 diabetes mellitus without complications: Secondary | ICD-10-CM

## 2023-03-28 DIAGNOSIS — I2699 Other pulmonary embolism without acute cor pulmonale: Secondary | ICD-10-CM | POA: Diagnosis not present

## 2023-03-28 DIAGNOSIS — I82409 Acute embolism and thrombosis of unspecified deep veins of unspecified lower extremity: Secondary | ICD-10-CM | POA: Diagnosis not present

## 2023-03-28 DIAGNOSIS — E538 Deficiency of other specified B group vitamins: Secondary | ICD-10-CM

## 2023-03-28 NOTE — Patient Instructions (Addendum)
You have had 4 months of anticoagulation (the goal was 3 to 6 months of treatment).  I would stop it 3 days prior to and the day of the dental work.   The longer you are on the blood thinner, the more your risk of bleeding goes up and the risk of a clot goes down.   Go to the lab on the way out.   If you have mychart we'll likely use that to update you.     If any bleeding or falls, then I think it makes sense to stop the eliquis.    If no bleeding or falls, it may make sense to keep taking eliquis through the end of June (except for the days you skip the medicine for the dental work).  Take care.  Glad to see you.

## 2023-03-28 NOTE — Progress Notes (Signed)
Discussed anticoagulation.  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  Single event of thrombosis.  No prior known events, d/w pt.  Recent u/s with no evidence of deep venous thrombosis in either lower extremity. No exertional chest pain.  Not SOB.  She has sig dental work coming up, d/w pt.  No bleeding.  No falls.  Discussed rationale for potentially taking up to 6 months of anticoagulation and then likely stopping.  If she had bleeding or falls or contraindication to anticoagulation he could be stopped sooner.  She would need to stop anticoagulation before the dental work temporarily.  The main question here is if she should resume anticoagulation after dental work and continue for a full 6 months of treatment, through the end of June.  Discussed.  We talked about risk and benefit of continued versus stopping anticoagulation.  Discussed that if she had a second event that would change her situation in terms of anticipated treatment duration.  DM2.  No meds.  Last A1c <6.5.  recheck labs pending.    Meds, vitals, and allergies reviewed.   ROS: Per HPI unless specifically indicated in ROS section   Nad Ncat Neck supple no LA Rrr Ctab Abd soft not ttp Trace BLE edema.   Skin well-perfused.

## 2023-03-29 LAB — COMPREHENSIVE METABOLIC PANEL
ALT: 15 U/L (ref 0–35)
AST: 20 U/L (ref 0–37)
Albumin: 3.9 g/dL (ref 3.5–5.2)
Alkaline Phosphatase: 57 U/L (ref 39–117)
BUN: 14 mg/dL (ref 6–23)
CO2: 27 mEq/L (ref 19–32)
Calcium: 9.5 mg/dL (ref 8.4–10.5)
Chloride: 103 mEq/L (ref 96–112)
Creatinine, Ser: 0.99 mg/dL (ref 0.40–1.20)
GFR: 53.62 mL/min — ABNORMAL LOW (ref >60.00)
Glucose, Bld: 93 mg/dL (ref 70–99)
Potassium: 4.1 mEq/L (ref 3.5–5.1)
Sodium: 139 mEq/L (ref 135–145)
Total Bilirubin: 0.4 mg/dL (ref 0.2–1.2)
Total Protein: 6.9 g/dL (ref 6.0–8.3)

## 2023-03-29 LAB — CBC WITH DIFFERENTIAL/PLATELET
Basophils Absolute: 0.1 10*3/uL (ref 0.0–0.1)
Basophils Relative: 1.2 % (ref 0.0–3.0)
Eosinophils Absolute: 0 10*3/uL (ref 0.0–0.7)
Eosinophils Relative: 0.5 % (ref 0.0–5.0)
HCT: 37.3 % (ref 36.0–46.0)
Hemoglobin: 12.3 g/dL (ref 12.0–15.0)
Lymphocytes Relative: 43.2 % (ref 12.0–46.0)
Lymphs Abs: 2.5 10*3/uL (ref 0.7–4.0)
MCHC: 33.1 g/dL (ref 30.0–36.0)
MCV: 87.6 fl (ref 78.0–100.0)
Monocytes Absolute: 0.5 10*3/uL (ref 0.1–1.0)
Monocytes Relative: 8.9 % (ref 3.0–12.0)
Neutro Abs: 2.6 10*3/uL (ref 1.4–7.7)
Neutrophils Relative %: 46.2 % (ref 43.0–77.0)
Platelets: 377 10*3/uL (ref 150.0–400.0)
RBC: 4.26 Mil/uL (ref 3.87–5.11)
RDW: 15.2 % (ref 11.5–15.5)
WBC: 5.7 10*3/uL (ref 4.0–10.5)

## 2023-03-29 LAB — LIPID PANEL
Cholesterol: 160 mg/dL (ref 0–200)
HDL: 69.5 mg/dL (ref >39.00)
LDL Cholesterol: 72 mg/dL (ref 0–99)
NonHDL: 90.51
Total CHOL/HDL Ratio: 2
Triglycerides: 94 mg/dL (ref 0.0–149.0)
VLDL: 18.8 mg/dL (ref 0.0–40.0)

## 2023-03-29 LAB — VITAMIN B12: Vitamin B-12: 1091 pg/mL — ABNORMAL HIGH (ref 211–911)

## 2023-03-29 LAB — HEMOGLOBIN A1C: Hgb A1c MFr Bld: 6.7 % — ABNORMAL HIGH (ref 4.6–6.5)

## 2023-03-29 NOTE — Assessment & Plan Note (Signed)
History of.  No medications.  See notes on labs.

## 2023-03-29 NOTE — Assessment & Plan Note (Signed)
She would need to stop anticoagulation before the dental work temporarily.  The main question here is if she should resume anticoagulation after dental work and continue for a full 6 months of treatment, through the end of June.  Discussed.  We talked about risk and benefit of continued versus stopping anticoagulation.  Discussed that if she had a second event that would change her situation in terms of anticipated treatment duration.  See notes on labs.  If any bleeding or falls then I think it makes sense to stop her anticoagulation sooner rather than later.  Otherwise she may be able to tolerate a full 6 months except for the 3 days prior to the dental procedure and the day of the dental procedure.  I want her to think about this and discuss with her family. 30 minutes were devoted to patient care in this encounter (this includes time spent reviewing the patient's file/history, interviewing and examining the patient, counseling/reviewing plan with patient).

## 2023-04-12 DIAGNOSIS — H34831 Tributary (branch) retinal vein occlusion, right eye, with macular edema: Secondary | ICD-10-CM | POA: Diagnosis not present

## 2023-04-19 ENCOUNTER — Ambulatory Visit (INDEPENDENT_AMBULATORY_CARE_PROVIDER_SITE_OTHER): Payer: 59

## 2023-04-19 DIAGNOSIS — E538 Deficiency of other specified B group vitamins: Secondary | ICD-10-CM | POA: Diagnosis not present

## 2023-04-19 MED ORDER — CYANOCOBALAMIN 1000 MCG/ML IJ SOLN
1000.0000 ug | Freq: Once | INTRAMUSCULAR | Status: AC
  Administered 2023-04-19: 1000 ug via INTRAMUSCULAR

## 2023-04-19 NOTE — Progress Notes (Signed)
Per orders of Dr. Para March, injection of monthly B12 1000 mcg/ml given by Eual Fines, CMA given in Left Deltoid. Advised pt to make next month's appt. Patient tolerated injection well.  Forwarding to Audria Nine, NP in Dr Lianne Bushy absence.

## 2023-04-21 ENCOUNTER — Telehealth: Payer: Self-pay | Admitting: Family Medicine

## 2023-04-21 NOTE — Telephone Encounter (Signed)
Patients daughter called to ask when patient is supposed to come off of apixaban (ELIQUIS) 5 MG TABS tablet .She couldn't remember if it was this  month or next.

## 2023-04-21 NOTE — Telephone Encounter (Signed)
Kelly Kennedy has been notified to stop in june

## 2023-04-21 NOTE — Telephone Encounter (Signed)
Looked like lab result note said she would take until June. Is this correct?

## 2023-04-21 NOTE — Telephone Encounter (Signed)
End of June would be a reasonable time to stop.  Thanks.

## 2023-04-25 ENCOUNTER — Other Ambulatory Visit: Payer: Self-pay | Admitting: Family Medicine

## 2023-04-25 DIAGNOSIS — Z1231 Encounter for screening mammogram for malignant neoplasm of breast: Secondary | ICD-10-CM

## 2023-04-26 ENCOUNTER — Ambulatory Visit: Payer: Self-pay

## 2023-04-26 NOTE — Patient Instructions (Signed)
Visit Information  Thank you for taking time to visit with me today. Please don't hesitate to contact me if I can be of assistance to you.   Following are the goals we discussed today:  Use your nitroglycerin for chest pain and call 911 Make sure your nitroglycerin is not expired.  If expired please refill Notify your provider that you are having difficulty sleeping Check blood pressure at least 2-3 times per day and record.   Our next appointment is by telephone on 05/31/23 at 2:30pm  Please call the care guide team at [phone] if you need to cancel or reschedule your appointment.   If you are experiencing a Mental Health or Behavioral Health Crisis or need someone to talk to, please call the Suicide and Crisis Lifeline: 988 call 1-800-273-TALK (toll free, 24 hour hotline)  Patient verbalizes understanding of instructions and care plan provided today and agrees to view in MyChart. Active MyChart status and patient understanding of how to access instructions and care plan via MyChart confirmed with patient.     George Ina RN,BSN,CCM [facility] Care Coordination [phone] direct line    Insomnia Insomnia is a sleep disorder that makes it difficult to fall asleep or stay asleep. Insomnia can cause fatigue, low energy, difficulty concentrating, mood swings, and poor performance at work or school. There are three different ways to classify insomnia: Difficulty falling asleep. Difficulty staying asleep. Waking up too early in the morning. Any type of insomnia can be long-term (chronic) or short-term (acute). Both are common. Short-term insomnia usually lasts for 3 months or less. Chronic insomnia occurs at least three times a week for longer than 3 months. What are the causes? Insomnia may be caused by another condition, situation, or substance, such as: Having certain mental health conditions, such as anxiety and depression. Using caffeine, alcohol, tobacco, or drugs. Having  gastrointestinal conditions, such as gastroesophageal reflux disease (GERD). Having certain medical conditions. These include: Asthma. Alzheimer's disease. Stroke. Chronic pain. An overactive thyroid gland (hyperthyroidism). Other sleep disorders, such as restless legs syndrome and sleep apnea. Menopause. Sometimes, the cause of insomnia may not be known. What increases the risk? Risk factors for insomnia include: Gender. Females are affected more often than males. Age. Insomnia is more common as people get older. Stress and certain medical and mental health conditions. Lack of exercise. Having an irregular work schedule. This may include working night shifts and traveling between different time zones. What are the signs or symptoms? If you have insomnia, the main symptom is having trouble falling asleep or having trouble staying asleep. This may lead to other symptoms, such as: Feeling tired or having low energy. Feeling nervous about going to sleep. Not feeling rested in the morning. Having trouble concentrating. Feeling irritable, anxious, or depressed. How is this diagnosed? This condition may be diagnosed based on: Your symptoms and medical history. Your health care provider may ask about: Your sleep habits. Any medical conditions you have. Your mental health. A physical exam. How is this treated? Treatment for insomnia depends on the cause. Treatment may focus on treating an underlying condition that is causing the insomnia. Treatment may also include: Medicines to help you sleep. Counseling or therapy. Lifestyle adjustments to help you sleep better. Follow these instructions at home: Eating and drinking  Limit or avoid alcohol, caffeinated beverages, and products that contain nicotine and tobacco, especially close to bedtime. These can disrupt your sleep. Do not eat a large meal or eat spicy foods right before bedtime.  This can lead to digestive discomfort that can make  it hard for you to sleep. Sleep habits  Keep a sleep diary to help you and your health care provider figure out what could be causing your insomnia. Write down: When you sleep. When you wake up during the night. How well you sleep and how rested you feel the next day. Any side effects of medicines you are taking. What you eat and drink. Make your bedroom a dark, comfortable place where it is easy to fall asleep. Put up shades or blackout curtains to block light from outside. Use a white noise machine to block noise. Keep the temperature cool. Limit screen use before bedtime. This includes: Not watching TV. Not using your smartphone, tablet, or computer. Stick to a routine that includes going to bed and waking up at the same times every day and night. This can help you fall asleep faster. Consider making a quiet activity, such as reading, part of your nighttime routine. Try to avoid taking naps during the day so that you sleep better at night. Get out of bed if you are still awake after 15 minutes of trying to sleep. Keep the lights down, but try reading or doing a quiet activity. When you feel sleepy, go back to bed. General instructions Take over-the-counter and prescription medicines only as told by your health care provider. Exercise regularly as told by your health care provider. However, avoid exercising in the hours right before bedtime. Use relaxation techniques to manage stress. Ask your health care provider to suggest some techniques that may work well for you. These may include: Breathing exercises. Routines to release muscle tension. Visualizing peaceful scenes. Make sure that you drive carefully. Do not drive if you feel very sleepy. Keep all follow-up visits. This is important. Contact a health care provider if: You are tired throughout the day. You have trouble in your daily routine due to sleepiness. You continue to have sleep problems, or your sleep problems get  worse. Get help right away if: You have thoughts about hurting yourself or someone else. Get help right away if you feel like you may hurt yourself or others, or have thoughts about taking your own life. Go to your nearest emergency room or: Call 911. Call the National Suicide Prevention Lifeline at 816 532 0278 or 988. This is open 24 hours a day. Text the Crisis Text Line at 2095893674. Summary Insomnia is a sleep disorder that makes it difficult to fall asleep or stay asleep. Insomnia can be long-term (chronic) or short-term (acute). Treatment for insomnia depends on the cause. Treatment may focus on treating an underlying condition that is causing the insomnia. Keep a sleep diary to help you and your health care provider figure out what could be causing your insomnia. This information is not intended to replace advice given to you by your health care provider. Make sure you discuss any questions you have with your health care provider. Document Revised: 10/25/2021 Document Reviewed: 10/25/2021 Elsevier Patient Education  2024 [employer].

## 2023-04-26 NOTE — Patient Outreach (Signed)
  Care Coordination   Follow Up Visit Note   04/26/2023 Name: Kelly Kennedy MRN: [id number] DOB: 1941/09/11  Kelly Kennedy is a 83 y.o. year old female who sees Joaquim Nam, MD for primary care. I spoke with  Arsema Maisie Fus by phone today.  What matters to the patients health and wellness today?  Patient states she is not sleeping that well at night. She reports going to bed around 3/4:00 am and waking up around 11:00 am.  Patient reports having chest pain / palpitation like symptoms approximately 1 week ago.  She states the event lasted about 15 minutes.  Patient denied using nitroglycerine tab.  She states 911 was not called.   Patient states the pain eventually passed.    Goals Addressed             This Visit's Progress    Develop plan of care for management of coronary artery disease       Interventions Today    Flowsheet Row Most Recent Value  Chronic Disease   Chronic disease during today's visit Other  [CAD]  General Interventions   General Interventions Discussed/Reviewed General Interventions Reviewed, Doctor Visits  [evaluation of current treatment plan for CAD and patients adherence to plan as established by provider. Assessed for chest pain, shortness of breath. Advised daughter to make sure patient has nitroglycerin on hand.]  Doctor Visits Discussed/Reviewed Doctor Visits Reviewed  Annabell Sabal upcoming provider appointments. Advised patient to notify provider of ongoing incidences of chest pain/ palpitation like symptoms.]  Education Interventions   Education Provided Provided Education, Provided Printed Education  [sent patient education information regarding insomina. Encouraged patient to talk with her primary care provider regarding insomnia like symptoms. Advised to take nitroglycerin for chest pain and call 911.]  Provided Verbal Education On Other  [Advised to monitor blood pressure at least 2-3 times per week and record.]  Pharmacy  Interventions   Pharmacy Dicussed/Reviewed Pharmacy Topics Reviewed  [medications reviewed and compliance discussed. Advised patient to make sure she has her nitroglycerin available if needed.  Advised to check expiration date on nitroglycernin.  Follow up with daughter if needs refil.]  Safety Interventions   Safety Discussed/Reviewed Safety Discussed  [assessed for falls.]               SDOH assessments and interventions completed:  No     Care Coordination Interventions:  Yes, provided   Follow up plan: Follow up call scheduled for 05/31/23    Encounter Outcome:  Pt. Visit Completed   George Ina RN,BSN,CCM [facility] Care Coordination [phone] direct line

## 2023-05-15 DIAGNOSIS — H34831 Tributary (branch) retinal vein occlusion, right eye, with macular edema: Secondary | ICD-10-CM | POA: Diagnosis not present

## 2023-05-23 ENCOUNTER — Ambulatory Visit (HOSPITAL_COMMUNITY)
Admission: RE | Admit: 2023-05-23 | Discharge: 2023-05-23 | Disposition: A | Discharge status: Home / Self Care | Payer: 59 | Source: Ambulatory Visit | Attending: General Practice | Admitting: General Practice

## 2023-05-23 ENCOUNTER — Encounter (HOSPITAL_COMMUNITY): Payer: Self-pay

## 2023-05-23 ENCOUNTER — Telehealth (HOSPITAL_COMMUNITY): Payer: Self-pay | Admitting: *Deleted

## 2023-05-23 DIAGNOSIS — I25118 Atherosclerotic heart disease of native coronary artery with other forms of angina pectoris: Secondary | ICD-10-CM | POA: Diagnosis not present

## 2023-05-23 MED ORDER — IOHEXOL 350 MG/ML SOLN
95.0000 mL | Freq: Once | INTRAVENOUS | Status: AC | PRN
  Administered 2023-05-23: 95 mL via INTRAVENOUS

## 2023-05-23 MED ORDER — NITROGLYCERIN 0.4 MG SL SUBL
0.8000 mg | SUBLINGUAL_TABLET | Freq: Once | SUBLINGUAL | Status: AC
  Administered 2023-05-23: 0.8 mg via SUBLINGUAL

## 2023-05-23 MED ORDER — METOPROLOL TARTRATE 5 MG/5ML IV SOLN
INTRAVENOUS | Status: AC
  Filled 2023-05-23: qty 5

## 2023-05-23 MED ORDER — METOPROLOL TARTRATE 100 MG PO TABS: ORAL_TABLET | ORAL | 0 refills | Status: DC

## 2023-05-23 MED ORDER — NITROGLYCERIN 0.4 MG SL SUBL
SUBLINGUAL_TABLET | SUBLINGUAL | Status: AC
  Filled 2023-05-23: qty 1

## 2023-05-23 NOTE — Telephone Encounter (Signed)
Reaching out to patient to offer assistance regarding upcoming cardiac imaging study; pt's daughter answered phone and verbalizes understanding of appt date/time, parking situation and where to check in, pre-test NPO status and medications ordered, and verified current allergies; name and call back number provided for further questions should they arise  Kelly Brick RN Navigator Cardiac Imaging [facility] [phone] office [phone] cell  Patient to take 100mg  metoprolol tartrate two hours prior to her cardiac CT scan. Her daughter is aware she is to arrive at 4 pm.

## 2023-05-24 ENCOUNTER — Ambulatory Visit (INDEPENDENT_AMBULATORY_CARE_PROVIDER_SITE_OTHER): Payer: 59

## 2023-05-24 DIAGNOSIS — E538 Deficiency of other specified B group vitamins: Secondary | ICD-10-CM

## 2023-05-24 MED ORDER — CYANOCOBALAMIN 1000 MCG/ML IJ SOLN
1000.0000 ug | Freq: Once | INTRAMUSCULAR | Status: AC
Start: 2023-05-24 — End: 2023-05-24
  Administered 2023-05-24: 1000 ug via INTRAMUSCULAR

## 2023-05-24 NOTE — Progress Notes (Signed)
Per orders of Dr. Para March, injection of monthly B12 1000 mcg/ml given by Eual Fines, CMA in Right Deltoid. Patient tolerated injection well.  Forwarding to Dr Sharen Hones in Dr Lianne Bushy absence.

## 2023-05-29 DIAGNOSIS — Z961 Presence of intraocular lens: Secondary | ICD-10-CM | POA: Diagnosis not present

## 2023-05-29 DIAGNOSIS — H353131 Nonexudative age-related macular degeneration, bilateral, early dry stage: Secondary | ICD-10-CM | POA: Diagnosis not present

## 2023-05-29 DIAGNOSIS — H0288B Meibomian gland dysfunction left eye, upper and lower eyelids: Secondary | ICD-10-CM | POA: Diagnosis not present

## 2023-05-29 DIAGNOSIS — H0288A Meibomian gland dysfunction right eye, upper and lower eyelids: Secondary | ICD-10-CM | POA: Diagnosis not present

## 2023-05-29 DIAGNOSIS — H34831 Tributary (branch) retinal vein occlusion, right eye, with macular edema: Secondary | ICD-10-CM | POA: Diagnosis not present

## 2023-05-29 DIAGNOSIS — H401131 Primary open-angle glaucoma, bilateral, mild stage: Secondary | ICD-10-CM | POA: Diagnosis not present

## 2023-05-29 DIAGNOSIS — E119 Type 2 diabetes mellitus without complications: Secondary | ICD-10-CM | POA: Diagnosis not present

## 2023-05-31 ENCOUNTER — Ambulatory Visit
Admission: RE | Admit: 2023-05-31 | Discharge: 2023-05-31 | Disposition: A | Discharge status: Home / Self Care | Payer: 59 | Source: Ambulatory Visit | Attending: Family Medicine | Admitting: Family Medicine

## 2023-05-31 DIAGNOSIS — Z1231 Encounter for screening mammogram for malignant neoplasm of breast: Secondary | ICD-10-CM

## 2023-06-05 ENCOUNTER — Ambulatory Visit: Payer: Self-pay

## 2023-06-05 ENCOUNTER — Telehealth: Payer: Self-pay

## 2023-06-05 NOTE — Telephone Encounter (Signed)
Received fax request for diabetes testing supplies. Verified with daughter on [place]. Patient daughter did not request testing supplies.  Patients daughter also received a urine sample kit not given by the office.  Melina Copa, CMA

## 2023-06-05 NOTE — Patient Instructions (Signed)
Visit Information  Thank you for taking time to visit with me today. Please don't hesitate to contact me if I can be of assistance to you.   Following are the goals we discussed today:   Goals Addressed             This Visit's Progress    Develop plan of care for management of coronary artery disease       Interventions Today    Flowsheet Row Most Recent Value  Chronic Disease   Chronic disease during today's visit Other  [CAD]  General Interventions   General Interventions Discussed/Reviewed General Interventions Reviewed, Doctor Visits  [evaluation of current treatment plan for  CAD and patients adherence to plan as established by provider.  Assessed for SOB/ chest pain.]  Doctor Visits Discussed/Reviewed Doctor Visits Reviewed  Annabell Sabal upcoming provider visits.]  Exercise Interventions   Exercise Discussed/Reviewed Physical Activity  [assessed for physical activity]  Education Interventions   Education Provided Provided Education  [Advised to call 911 for severe chest pain/ SOB.  Discussed sleep hygiene.]  Pharmacy Interventions   Pharmacy Dicussed/Reviewed Pharmacy Topics Reviewed  [medications reviewed and compliance discussed.]  Safety Interventions   Safety Discussed/Reviewed Fall Risk  [assessed for falls.]               Our next appointment is by telephone on 08/02/23 at 11 am  Please call the care guide team at [phone] if you need to cancel or reschedule your appointment.   If you are experiencing a Mental Health or Behavioral Health Crisis or need someone to talk to, please call the Suicide and Crisis Lifeline: 988  Patient verbalizes understanding of instructions and care plan provided today and agrees to view in MyChart. Active MyChart status and patient understanding of how to access instructions and care plan via MyChart confirmed with patient.     George Ina RN,BSN,CCM [facility] Care Coordination [phone] direct line

## 2023-06-05 NOTE — Patient Outreach (Signed)
  Care Coordination   Follow Up Visit Note   06/05/2023 Name: Taquana Taleeyah Azcarate MRN: [id number] DOB: 16-Oct-1941  Hue Tamonica Jeffords is a 82 y.o. year old female who sees Joaquim Nam, MD for primary care. I spoke with  Alexius Maisie Fus by phone today.  What matters to the patients health and wellness today?  Patient states she is doing ok.  Denies any recent symptoms of SOB/ chest pain. She states she continues to have difficulty with her sleep off and on.  She states she does Elavil to take for sleep as needed.  Patient denies any falls.  She states she cuts her grass occasionally for physical activity.    Goals Addressed             This Visit's Progress    Develop plan of care for management of coronary artery disease       Interventions Today    Flowsheet Row Most Recent Value  Chronic Disease   Chronic disease during today's visit Other  [CAD]  General Interventions   General Interventions Discussed/Reviewed General Interventions Reviewed, Doctor Visits  [evaluation of current treatment plan for  CAD and patients adherence to plan as established by provider.  Assessed for SOB/ chest pain.]  Doctor Visits Discussed/Reviewed Doctor Visits Reviewed  Annabell Sabal upcoming provider visits.]  Exercise Interventions   Exercise Discussed/Reviewed Physical Activity  [assessed for physical activity]  Education Interventions   Education Provided Provided Education  [Advised to call 911 for severe chest pain/ SOB.  Discussed sleep hygiene.]  Pharmacy Interventions   Pharmacy Dicussed/Reviewed Pharmacy Topics Reviewed  [medications reviewed and compliance discussed.]  Safety Interventions   Safety Discussed/Reviewed Fall Risk  [assessed for falls.]               SDOH assessments and interventions completed:  No     Care Coordination Interventions:  Yes, provided   Follow up plan: Follow up call scheduled for 08/02/23    Encounter Outcome:  Pt. Visit Completed    George Ina RN,BSN,CCM [facility] Care Coordination [phone] direct line

## 2023-06-19 DIAGNOSIS — H34831 Tributary (branch) retinal vein occlusion, right eye, with macular edema: Secondary | ICD-10-CM | POA: Diagnosis not present

## 2023-06-20 ENCOUNTER — Other Ambulatory Visit: Payer: Self-pay | Admitting: Family Medicine

## 2023-06-30 ENCOUNTER — Telehealth: Payer: Self-pay | Admitting: Family Medicine

## 2023-06-30 MED ORDER — DONEPEZIL HCL 5 MG PO TBDP: 5.0000 mg | ORAL_TABLET | Freq: Every day | ORAL | 1 refills | Status: DC

## 2023-06-30 NOTE — Telephone Encounter (Signed)
Prescription Request  06/30/2023  LOV: 03/28/2023  What is the name of the medication or equipment?  donepezil (ARICEPT ODT) 5 MG disintegrating tablet  Have you contacted your pharmacy to request a refill? Yes   Which pharmacy would you like this sent to?  [id number] - [place], [place] - [address] AT [facility] [address] [place] [address] Phone: [phone] Fax: [phone]     Patient notified that their request is being sent to the clinical staff for review and that they should receive a response within 2 business days.   Please advise at Mobile [phone] (mobile)

## 2023-06-30 NOTE — Telephone Encounter (Signed)
Medication: Donepezil (Aricept ODT) 5 mg  Directions: Take 1 tablet by mouth every evening  Last given: 01/26/23 Number refills: 1 Last o/v: 03/28/23 Follow up: 07/03/23

## 2023-07-01 ENCOUNTER — Other Ambulatory Visit: Payer: Self-pay | Admitting: Physician Assistant

## 2023-07-01 ENCOUNTER — Other Ambulatory Visit: Payer: Self-pay | Admitting: Family Medicine

## 2023-07-01 DIAGNOSIS — K219 Gastro-esophageal reflux disease without esophagitis: Secondary | ICD-10-CM

## 2023-07-03 ENCOUNTER — Ambulatory Visit (INDEPENDENT_AMBULATORY_CARE_PROVIDER_SITE_OTHER): Payer: 59 | Admitting: Family Medicine

## 2023-07-03 ENCOUNTER — Encounter: Payer: Self-pay | Admitting: Family Medicine

## 2023-07-03 VITALS — BP 160/80 | HR 101 | Temp 98.3°F | Ht 65.0 in | Wt 176.0 lb

## 2023-07-03 DIAGNOSIS — Z862 Personal history of diseases of the blood and blood-forming organs and certain disorders involving the immune mechanism: Secondary | ICD-10-CM

## 2023-07-03 DIAGNOSIS — Z8639 Personal history of other endocrine, nutritional and metabolic disease: Secondary | ICD-10-CM

## 2023-07-03 DIAGNOSIS — E538 Deficiency of other specified B group vitamins: Secondary | ICD-10-CM | POA: Diagnosis not present

## 2023-07-03 DIAGNOSIS — R195 Other fecal abnormalities: Secondary | ICD-10-CM

## 2023-07-03 DIAGNOSIS — G4700 Insomnia, unspecified: Secondary | ICD-10-CM

## 2023-07-03 DIAGNOSIS — R42 Dizziness and giddiness: Secondary | ICD-10-CM | POA: Diagnosis not present

## 2023-07-03 DIAGNOSIS — E119 Type 2 diabetes mellitus without complications: Secondary | ICD-10-CM | POA: Diagnosis not present

## 2023-07-03 LAB — CBC WITH DIFFERENTIAL/PLATELET
Basophils Absolute: 0.1 10*3/uL (ref 0.0–0.1)
Basophils Relative: 1 % (ref 0.0–3.0)
Eosinophils Absolute: 0 10*3/uL (ref 0.0–0.7)
Eosinophils Relative: 0.2 % (ref 0.0–5.0)
HCT: 39.3 % (ref 36.0–46.0)
Hemoglobin: 12.7 g/dL (ref 12.0–15.0)
Lymphocytes Relative: 39.2 % (ref 12.0–46.0)
Lymphs Abs: 2 10*3/uL (ref 0.7–4.0)
MCHC: 32.3 g/dL (ref 30.0–36.0)
MCV: 88.3 fl (ref 78.0–100.0)
Monocytes Absolute: 0.4 10*3/uL (ref 0.1–1.0)
Monocytes Relative: 8.2 % (ref 3.0–12.0)
Neutro Abs: 2.7 10*3/uL (ref 1.4–7.7)
Neutrophils Relative %: 51.4 % (ref 43.0–77.0)
Platelets: 327 10*3/uL (ref 150.0–400.0)
RBC: 4.45 Mil/uL (ref 3.87–5.11)
RDW: 14.6 % (ref 11.5–15.5)
WBC: 5.2 10*3/uL (ref 4.0–10.5)

## 2023-07-03 LAB — IRON: Iron: 60 ug/dL (ref 42–145)

## 2023-07-03 LAB — COMPREHENSIVE METABOLIC PANEL
ALT: 10 U/L (ref 0–35)
AST: 16 U/L (ref 0–37)
Albumin: 4.1 g/dL (ref 3.5–5.2)
Alkaline Phosphatase: 64 U/L (ref 39–117)
BUN: 8 mg/dL (ref 6–23)
CO2: 29 mEq/L (ref 19–32)
Calcium: 9.4 mg/dL (ref 8.4–10.5)
Chloride: 104 mEq/L (ref 96–112)
Creatinine, Ser: 0.89 mg/dL (ref 0.40–1.20)
GFR: 60.82 mL/min (ref >60.00)
Glucose, Bld: 105 mg/dL — ABNORMAL HIGH (ref 70–99)
Potassium: 3.7 mEq/L (ref 3.5–5.1)
Sodium: 144 mEq/L (ref 135–145)
Total Bilirubin: 0.3 mg/dL (ref 0.2–1.2)
Total Protein: 6.8 g/dL (ref 6.0–8.3)

## 2023-07-03 LAB — FERRITIN: Ferritin: 18.3 ng/mL (ref 10.0–291.0)

## 2023-07-03 LAB — HEMOGLOBIN A1C: Hgb A1c MFr Bld: 6.4 % (ref 4.6–6.5)

## 2023-07-03 MED ORDER — CYANOCOBALAMIN 1000 MCG/ML IJ SOLN
1000.0000 ug | Freq: Once | INTRAMUSCULAR | Status: AC
Start: 2023-07-03 — End: 2023-07-03
  Administered 2023-07-03: 1000 ug via INTRAMUSCULAR

## 2023-07-03 MED ORDER — AMITRIPTYLINE HCL 50 MG PO TABS: 50.0000 mg | ORAL_TABLET | Freq: Every evening | ORAL | 1 refills | Status: DC | PRN

## 2023-07-03 NOTE — Progress Notes (Unsigned)
Dark vs black stools.  Off anticoagulation.  No BRBPR.  Stool changes are intermittent.  No abd pain.   Recheck BP 160/80.   H/o DM2.  No meds.  Recheck labs pending.  See notes on labs.  Due for B12 shot.  Done at OV.    She needed refill on amitriptyline.  She slept better on med.  Prescription sent.  She had episode of vertigo today.  Resolved after her shower.  This was a one time event.  Noted getting out of the bed.  Not lightheaded otherwise.  Meds, vitals, and allergies reviewed.   ROS: Per HPI unless specifically indicated in ROS section   GEN: nad, alert and oriented HEENT: ncat NECK: supple w/o LA CV: rrr PULM: ctab, no inc wob ABD: soft, +bs EXT: trace BLE edema SKIN: no acute rash Tight ear canals but TM wnl B.  No canal wax but minimal external irritation.  Normal neck ROM today in 3 planes without inducing vertiginous symptoms  30 minutes were devoted to patient care in this encounter (this includes time spent reviewing the patient's file/history, interviewing and examining the patient, counseling/reviewing plan with patient).

## 2023-07-03 NOTE — Patient Instructions (Addendum)
Please make sure you are off apixaban/eliquis.   See if you are still taking amlodipine.  Either way, please let me know.   Let me know if you have more trouble with vertigo.    Go to the lab on the way out.   If you have mychart we'll likely use that to update you.    Take care.  Glad to see you.  B12 shot today and repeat in 1 month.

## 2023-07-05 DIAGNOSIS — R195 Other fecal abnormalities: Secondary | ICD-10-CM | POA: Insufficient documentation

## 2023-07-05 DIAGNOSIS — R42 Dizziness and giddiness: Secondary | ICD-10-CM | POA: Insufficient documentation

## 2023-07-05 NOTE — Assessment & Plan Note (Signed)
Discussed that she could have had BPV.  I asked her to update me as needed.

## 2023-07-05 NOTE — Assessment & Plan Note (Signed)
Off anticoagulation reportedly, I asked her to verify that.  At this point still okay for outpatient follow-up.  See notes on labs.

## 2023-07-05 NOTE — Assessment & Plan Note (Signed)
See notes on labs. 

## 2023-07-05 NOTE — Assessment & Plan Note (Signed)
Dose of B12 given today, repeat monthly.

## 2023-07-05 NOTE — Assessment & Plan Note (Signed)
Continue amitriptyline at night.

## 2023-07-06 ENCOUNTER — Ambulatory Visit: Payer: 59

## 2023-07-13 ENCOUNTER — Encounter (INDEPENDENT_AMBULATORY_CARE_PROVIDER_SITE_OTHER): Payer: Self-pay

## 2023-07-16 ENCOUNTER — Other Ambulatory Visit: Payer: Self-pay | Admitting: Family Medicine

## 2023-07-24 DIAGNOSIS — H34831 Tributary (branch) retinal vein occlusion, right eye, with macular edema: Secondary | ICD-10-CM | POA: Diagnosis not present

## 2023-07-26 ENCOUNTER — Other Ambulatory Visit: Payer: Self-pay | Admitting: Physician Assistant

## 2023-07-26 DIAGNOSIS — K219 Gastro-esophageal reflux disease without esophagitis: Secondary | ICD-10-CM

## 2023-08-01 ENCOUNTER — Encounter: Payer: Self-pay | Admitting: Family Medicine

## 2023-08-01 ENCOUNTER — Ambulatory Visit (INDEPENDENT_AMBULATORY_CARE_PROVIDER_SITE_OTHER): Payer: 59 | Admitting: Family Medicine

## 2023-08-01 VITALS — BP 126/82 | HR 83 | Temp 97.5°F | Ht 65.0 in | Wt 172.0 lb

## 2023-08-01 DIAGNOSIS — Z23 Encounter for immunization: Secondary | ICD-10-CM | POA: Diagnosis not present

## 2023-08-01 DIAGNOSIS — R413 Other amnesia: Secondary | ICD-10-CM

## 2023-08-01 DIAGNOSIS — Z8639 Personal history of other endocrine, nutritional and metabolic disease: Secondary | ICD-10-CM

## 2023-08-01 DIAGNOSIS — E538 Deficiency of other specified B group vitamins: Secondary | ICD-10-CM | POA: Diagnosis not present

## 2023-08-01 MED ORDER — DONEPEZIL HCL 5 MG PO TBDP: 10.0000 mg | ORAL_TABLET | Freq: Every day | ORAL | Status: DC

## 2023-08-01 MED ORDER — CYANOCOBALAMIN 1000 MCG/ML IJ SOLN
1000.0000 ug | Freq: Once | INTRAMUSCULAR | Status: AC
Start: 2023-08-01 — End: 2023-08-01
  Administered 2023-08-01: 1000 ug via INTRAMUSCULAR

## 2023-08-01 NOTE — Patient Instructions (Addendum)
Flu shot today.  B12 shot today.  Increase donepezil to 10mg  a day night- 2 of the 5mg  tabs.  Let me know if you don't tolerate that.  Your BP was fine today.  Plan on recheck in January 2025.  We can recheck labs at the visit.   Take care.  Glad to see you.

## 2023-08-01 NOTE — Progress Notes (Signed)
Not diabetic based on most recent A1c. D/w pt.    HGB wnl on last check, d/w pt.   H/o B12 def. Last dose last month.  Dose done at OV.  Given her history/Tran she can likely get her follow-up B12 injection at her next office visit.  Discussed.  Still with some insomnia, taking amitriptyline at baseline.    Memory d/w pt.  Taking donepezil at night.  Recheck memory today: MMSE 24/30.  Prev 25/30. She had trouble driving to [place]- couldn't merge onto the highway during high traffic.   Cautions discussed with patient.  Her daughter is helping her manage her medications.  Meds, vitals, and allergies reviewed.   ROS: Per HPI unless specifically indicated in ROS section   GEN: nad, alert and oriented except as below. HEENT: mucous membranes moist NECK: supple w/o LA CV: rrr. PULM: ctab, no inc wob ABD: soft, +bs EXT: no edema SKIN: no acute rash MMSE 24/30.  -1 for orientation, -2 for calculation, -2 for recall, -1 for [employer] copying. 30 minutes were devoted to patient care in this encounter (this includes time spent reviewing the patient's file/history, interviewing and examining the patient, counseling/reviewing plan with patient).

## 2023-08-02 ENCOUNTER — Ambulatory Visit: Payer: Self-pay

## 2023-08-02 ENCOUNTER — Telehealth: Payer: Self-pay | Admitting: Family Medicine

## 2023-08-02 NOTE — Assessment & Plan Note (Signed)
H/o B12 def. Last dose last month.  Dose done at OV.  Given her history/Tran she can likely get her follow-up B12 injection at her next office visit.  Discussed.

## 2023-08-02 NOTE — Telephone Encounter (Signed)
Please check with/update patient's daughter.  Patient gave permission for this at the office visit.  Her memory testing is worse in the meantime.  Patient reported difficulty driving in [place].  Has her daughter talked with her about her driving situation?  Does she have help available for transportation so that she would not need to drive?  What other resources does her daughter see that patient could use at home?  We increased her donepezil to 10 mg at night to try to help her memory.  She can take 2 of the 5 mg pills.  If she is not tolerating that or if she is having more trouble sleeping then please let me know.  Thanks.

## 2023-08-02 NOTE — Assessment & Plan Note (Signed)
Not diabetic based on most recent A1c. D/w pt.   continue work on diet.

## 2023-08-02 NOTE — Assessment & Plan Note (Addendum)
MMSE 24/30.  -1 for orientation, -2 for calculation, -2 for recall, -1 for [employer] copying.  Appears to be gradually worse.  Driving cautions discussed with patient.  I will ask staff to check in with patient's daughter.  See following phone note.  Increase donepezil in the meantime to 10 mg at night.  She will update me if that is not tolerated or if she has more trouble with sleep.

## 2023-08-03 NOTE — Patient Outreach (Signed)
  Care Coordination   Follow Up Visit Note   08/03/2023 Name: Gelisa Greenly Llera MRN: [id number] DOB: 20-Jul-1941  Maddalyn Calliope Fugere is a 82 y.o. year old female who sees Joaquim Nam, MD for primary care. I spoke with  Dalilah Maisie Fus by phone today.  What matters to the patients health and wellness today?  Patient states she continues to have occasional SOB/ CP.  She states she uses the nitroglycerin when needed for chest pain.  Patient states she continues to monitor her BP 2 x per day and record.  She reports BP readings have ranged from 130's/70's to 170's/ 70's.  Patient states she is concerned that sometimes her monitor is not reading her BP correctly.  Patient states she thought that a BP reading of 149/80 was bad.  Patient states she had trouble merging onto the highway when driving in [place] recently.  She states her daughter will take her to her appointments as well.     Goals Addressed             This Visit's Progress    Develop plan of care for management of coronary artery disease/ memory concerns       Interventions Today    Flowsheet Row Most Recent Value  Chronic Disease   Chronic disease during today's visit Other  [CAD, memory change]  General Interventions   General Interventions Discussed/Reviewed General Interventions Reviewed, Doctor Visits  [evaluation of current treatment plan for mentioned health conditions and patients adherence to plan as established by provider. Assessed for CP, SOB and memory concerns.]  Doctor Visits Discussed/Reviewed Doctor Visits Reviewed  Annabell Sabal upcoming provider visits. Advised to keep follow up visit with provider as recommended.  Discussed patients access to transportation assistance. RNCM to contact daughter to discuss patients recent difficulty driving situation.]  Education Interventions   Education Provided Provided Education, Provided Printed Education  [Advised to call 911 for severe CP/ SOB.  Advised to use  nitroglycerin as prescribed for CP. Advised to continue monitoring BP daily and record. Advised to take BP readings and monitor to next provider visit.]  Provided Verbal Education On Other  [sent patient education article on BP monitoring. Discussed with patient normal blood pressure ranges.]  Pharmacy Interventions   Pharmacy Dicussed/Reviewed Pharmacy Topics Reviewed  [Discussed with patient recent increase in memory medication donepezil as prescribed by provider.  RNCM to call patients daughter to review/ discuss medications and medication adjustment]               SDOH assessments and interventions completed:  No     Care Coordination Interventions:  Yes, provided   Follow up plan: Follow up call scheduled for 09/22/23    Encounter Outcome:  Patient Visit Completed   George Ina RN,BSN,CCM [facility] Care Coordination [phone] direct line

## 2023-08-03 NOTE — Patient Instructions (Signed)
Visit Information  Thank you for taking time to visit with me today. Please don't hesitate to contact me if I can be of assistance to you.   Following are the goals we discussed today:   Goals Addressed             This Visit's Progress    Develop plan of care for management of coronary artery disease/ memory concerns       Interventions Today    Flowsheet Row Most Recent Value  Chronic Disease   Chronic disease during today's visit Other  [CAD, memory change]  General Interventions   General Interventions Discussed/Reviewed General Interventions Reviewed, Doctor Visits  [evaluation of current treatment plan for mentioned health conditions and patients adherence to plan as established by provider. Assessed for CP, SOB and memory concerns.]  Doctor Visits Discussed/Reviewed Doctor Visits Reviewed  Annabell Sabal upcoming provider visits. Advised to keep follow up visit with provider as recommended.  Discussed patients access to transportation assistance. RNCM to contact daughter to discuss patients recent difficulty driving situation.]  Education Interventions   Education Provided Provided Education, Provided Printed Education  [Advised to call 911 for severe CP/ SOB.  Advised to use nitroglycerin as prescribed for CP. Advised to continue monitoring BP daily and record. Advised to take BP readings and monitor to next provider visit.]  Provided Verbal Education On Other  [sent patient education article on BP monitoring. Discussed with patient normal blood pressure ranges.]  Pharmacy Interventions   Pharmacy Dicussed/Reviewed Pharmacy Topics Reviewed  [Discussed with patient recent increase in memory medication donepezil as prescribed by provider.  RNCM to call patients daughter to review/ discuss medications and medication adjustment]               Our next appointment is by telephone on 09/22/23 at 11:00 am  Please call the care guide team at [phone] if you need to cancel or  reschedule your appointment.   If you are experiencing a Mental Health or Behavioral Health Crisis or need someone to talk to, please call the Suicide and Crisis Lifeline: 988 call 1-800-273-TALK (toll free, 24 hour hotline)  Patient verbalizes understanding of instructions and care plan provided today and agrees to view in MyChart. Active MyChart status and patient understanding of how to access instructions and care plan via MyChart confirmed with patient.     George Ina RN,BSN,CCM [facility] Care Coordination [phone] direct line  Managing Your Hypertension Hypertension, also called high blood pressure, is when the force of the blood pressing against the walls of the arteries is too strong. Arteries are blood vessels that carry blood from your heart throughout your body. Hypertension forces the heart to work harder to pump blood and may cause the arteries to become narrow or stiff. Understanding blood pressure readings A blood pressure reading includes a higher number over a lower number: The first, or top, number is called the systolic pressure. It is a measure of the pressure in your arteries as your heart beats. The second, or bottom number, is called the diastolic pressure. It is a measure of the pressure in your arteries as the heart relaxes. For most people, a normal blood pressure is below 120/80. Your personal target blood pressure may vary depending on your medical conditions, your age, and other factors. Blood pressure is classified into four stages. Based on your blood pressure reading, your health care provider may use the following stages to determine what type of treatment you need, if any. Systolic pressure and  diastolic pressure are measured in a unit called millimeters of mercury (mmHg). Normal Systolic pressure: below 120. Diastolic pressure: below 80. Elevated Systolic pressure: 120-129. Diastolic pressure: below 80. Hypertension stage 1 Systolic pressure:  130-139. Diastolic pressure: 80-89. Hypertension stage 2 Systolic pressure: 140 or above. Diastolic pressure: 90 or above. How can this condition affect me? Managing your hypertension is very important. Over time, hypertension can damage the arteries and decrease blood flow to parts of the body, including the brain, heart, and kidneys. Having untreated or uncontrolled hypertension can lead to: A heart attack. A stroke. A weakened blood vessel (aneurysm). Heart failure. Kidney damage. Eye damage. Memory and concentration problems. Vascular dementia. What actions can I take to manage this condition? Hypertension can be managed by making lifestyle changes and possibly by taking medicines. Your health care provider will help you make a plan to bring your blood pressure within a normal range. You may be referred for counseling on a healthy diet and physical activity. Nutrition  Eat a diet that is high in fiber and potassium, and low in salt (sodium), added sugar, and fat. An example eating plan is called the DASH diet. DASH stands for Dietary Approaches to Stop Hypertension. To eat this way: Eat plenty of fresh fruits and vegetables. Try to fill one-half of your plate at each meal with fruits and vegetables. Eat whole grains, such as whole-wheat pasta, brown rice, or whole-grain bread. Fill about one-fourth of your plate with whole grains. Eat low-fat dairy products. Avoid fatty cuts of meat, processed or cured meats, and poultry with skin. Fill about one-fourth of your plate with lean proteins such as fish, chicken without skin, beans, eggs, and tofu. Avoid pre-made and processed foods. These tend to be higher in sodium, added sugar, and fat. Reduce your daily sodium intake. Many people with hypertension should eat less than 1,500 mg of sodium a day. Lifestyle  Work with your health care provider to maintain a healthy body weight or to lose weight. Ask what an ideal weight is for you. Get at  least 30 minutes of exercise that causes your heart to beat faster (aerobic exercise) most days of the week. Activities may include walking, swimming, or biking. Include exercise to strengthen your muscles (resistance exercise), such as weight lifting, as part of your weekly exercise routine. Try to do these types of exercises for 30 minutes at least 3 days a week. Do not use any products that contain nicotine or tobacco. These products include cigarettes, chewing tobacco, and vaping devices, such as e-cigarettes. If you need help quitting, ask your health care provider. Control any long-term (chronic) conditions you have, such as high cholesterol or diabetes. Identify your sources of stress and find ways to manage stress. This may include meditation, deep breathing, or making time for fun activities. Alcohol use Do not drink alcohol if: Your health care provider tells you not to drink. You are pregnant, may be pregnant, or are planning to become pregnant. If you drink alcohol: Limit how much you have to: 0-1 drink a day for women. 0-2 drinks a day for men. Know how much alcohol is in your drink. In the [place] one drink equals one 12 oz bottle of beer (355 mL), one 5 oz glass of wine (148 mL), or one 1 oz glass of hard liquor (44 mL). Medicines Your health care provider may prescribe medicine if lifestyle changes are not enough to get your blood pressure under control and if: Your systolic blood pressure  is 130 or higher. Your diastolic blood pressure is 80 or higher. Take medicines only as told by your health care provider. Follow the directions carefully. Blood pressure medicines must be taken as told by your health care provider. The medicine does not work as well when you skip doses. Skipping doses also puts you at risk for problems. Monitoring Before you monitor your blood pressure: Do not smoke, drink caffeinated beverages, or exercise within 30 minutes before taking a measurement. Use  the bathroom and empty your bladder (urinate). Sit quietly for at least 5 minutes before taking measurements. Monitor your blood pressure at home as told by your health care provider. To do this: Sit with your back straight and supported. Place your feet flat on the floor. Do not cross your legs. Support your arm on a flat surface, such as a table. Make sure your upper arm is at heart level. Each time you measure, take two or three readings one minute apart and record the results. You may also need to have your blood pressure checked regularly by your health care provider. General information Talk with your health care provider about your diet, exercise habits, and other lifestyle factors that may be contributing to hypertension. Review all the medicines you take with your health care provider because there may be side effects or interactions. Keep all follow-up visits. Your health care provider can help you create and adjust your plan for managing your high blood pressure. Where to find more information National Heart, Lung, and Blood Institute: PopSte[employer].is American Heart Association: www.heart.org Contact a health care provider if: You think you are having a reaction to medicines you have taken. You have repeated (recurrent) headaches. You feel dizzy. You have swelling in your ankles. You have trouble with your vision. Get help right away if: You develop a severe headache or confusion. You have unusual weakness or numbness, or you feel faint. You have severe pain in your chest or abdomen. You vomit repeatedly. You have trouble breathing. These symptoms may be an emergency. Get help right away. Call 911. Do not wait to see if the symptoms will go away. Do not drive yourself to the hospital. Summary Hypertension is when the force of blood pumping through your arteries is too strong. If this condition is not controlled, it may put you at risk for serious complications. Your  personal target blood pressure may vary depending on your medical conditions, your age, and other factors. For most people, a normal blood pressure is less than 120/80. Hypertension is managed by lifestyle changes, medicines, or both. Lifestyle changes to help manage hypertension include losing weight, eating a healthy, low-sodium diet, exercising more, stopping smoking, and limiting alcohol. This information is not intended to replace advice given to you by your health care provider. Make sure you discuss any questions you have with your health care provider. Document Revised: 07/29/2021 Document Reviewed: 07/29/2021 Elsevier Patient Education  2024 [employer].

## 2023-08-04 NOTE — Telephone Encounter (Signed)
Spoke to daughter, Agustin Cree. They have talked to her about her driving. She does local driving well. It was when she went to [place] and got turned around. She says the pt will not give up driving completley.  Darlene said increasing donepezil will not work because the pt has not been honest with Dr Para March about the medication. Pt has told her that she has nightmares and will not increase it or take it at all. Thinks she needs something different.

## 2023-08-06 NOTE — Telephone Encounter (Signed)
Can her daughter verify her medication list otherwise and then let us know about what she is/is not taking (or is taking differently than listed)?  That would be greatly appreciated.

## 2023-08-07 NOTE — Telephone Encounter (Signed)
Called and spoke with Agustin Cree, patients daughter, she was unable to go over med list at the moment. She will call back later when she is able to.

## 2023-08-08 NOTE — Telephone Encounter (Signed)
Called and spoke to daughter. I have made changes to printed out med list and placed in your box for review. She is only taking the Aricept 5mg  at bedtime. If she goes up any had bad nightmares. She also request refill on Lidex.

## 2023-08-08 NOTE — Telephone Encounter (Signed)
Called daughter she will be there in 20 minutes requested call back to review meds.

## 2023-08-09 MED ORDER — FLUOCINONIDE 0.05 % EX CREA: TOPICAL_CREAM | CUTANEOUS | 1 refills | Status: DC

## 2023-08-09 MED ORDER — MEMANTINE HCL 5 MG PO TABS: ORAL_TABLET | ORAL | 3 refills | Status: DC

## 2023-08-09 NOTE — Telephone Encounter (Signed)
I would stop donepezil for 1 week then try to start taking Namenda 5 mg a day.  If tolerated for 1 week then increase to 5 mg twice a day.  I sent a prescription for that.  Also sent the refill for Lidex.  I would like to recheck her memory at a visit in January 2025.  Please see if her daughter can come to the visit.  Please update me as needed in the meantime, especially if not tolerating the change to Namenda.

## 2023-08-09 NOTE — Addendum Note (Signed)
Addended by: Joaquim Nam on: 08/09/2023 11:41 PM   Modules accepted: Orders

## 2023-08-10 ENCOUNTER — Other Ambulatory Visit: Payer: Self-pay | Admitting: Cardiovascular Disease

## 2023-08-10 DIAGNOSIS — I214 Non-ST elevation (NSTEMI) myocardial infarction: Secondary | ICD-10-CM

## 2023-08-10 DIAGNOSIS — I25118 Atherosclerotic heart disease of native coronary artery with other forms of angina pectoris: Secondary | ICD-10-CM

## 2023-08-10 NOTE — Telephone Encounter (Signed)
Called and spoke with patients daughter, advised of Dr. Armanda Heritage message. She verbalized understanding. Scheduled appt for Jan 2025, she will accompany patient to appt. Advised rot let us know if patient does not tolerate Namenda.

## 2023-08-11 ENCOUNTER — Other Ambulatory Visit: Payer: Self-pay | Admitting: Family Medicine

## 2023-08-26 ENCOUNTER — Other Ambulatory Visit: Payer: Self-pay | Admitting: Family Medicine

## 2023-09-01 ENCOUNTER — Other Ambulatory Visit: Payer: Self-pay | Admitting: Family Medicine

## 2023-09-01 NOTE — Telephone Encounter (Signed)
Ok to change to 90 day.

## 2023-09-03 MED ORDER — MEMANTINE HCL 5 MG PO TABS: 5.0000 mg | ORAL_TABLET | Freq: Two times a day (BID) | ORAL | 1 refills | Status: DC

## 2023-09-03 NOTE — Telephone Encounter (Signed)
Sent. Thanks.   

## 2023-09-04 NOTE — Progress Notes (Deleted)
Cardiology Clinic Note   Patient Name: Kelly Kennedy Date of Encounter: 09/04/2023  Primary Care Provider:  Joaquim Nam, MD Primary Cardiologist:  Thurmon Fair, MD  Patient Profile    Kelly Kennedy 82 year old female presents the clinic today for follow-up evaluation of her  coronary artery disease.  Past Medical History    Past Medical History:  Diagnosis Date   adenomatous Colon polyps    colonoscopy 8/99, 12/02, 8/06   Arthritis    Bell's palsy    Cancer (HCC)    DCIS left breast   Cataract    Chest pain    uses NTG as needed   Depression    Diabetes mellitus without complication (HCC)    Diverticulosis of colon 06/2005   Ductal carcinoma in situ (DCIS) of left breast    Family history of adverse reaction to anesthesia    " my mother didn't wake up so they had to put her on life support for about an hour or more; my sisiter has problems waking up too from anesthesia.   GERD (gastroesophageal reflux disease)    Glaucoma    H/O hiatal hernia    Headache(784.0)    Hyperglycemia    Hyperlipidemia    Hypertension    Hypothyroid    Insomnia    Irritable bowel    Menopausal symptoms    Pericarditis 1980's   Stroke [facility])    TIA (transient ischemic attack)    09/2014   Past Surgical History:  Procedure Laterality Date   APPENDECTOMY     BREAST EXCISIONAL BIOPSY Right    benign   BREAST RECONSTRUCTION WITH PLACEMENT OF TISSUE EXPANDER AND FLEX HD (ACELLULAR HYDRATED DERMIS) Left 08/06/2015   Procedure: LEFT BREAST RECONSTRUCTION WITH PLACEMENT OF SALINE IMPLANT AND ACELLULAR DURMAL MATRIX;  Surgeon: Etter Sjogren, MD;  Location: [facility] OR;  Service: Plastics;  Laterality: Left;   CARDIAC CATHETERIZATION     CATARACT EXTRACTION Bilateral 2016   CHOLECYSTECTOMY  1990   COLONOSCOPY     CYSTECTOMY  05/04/2008   left lower arm, benign   ECTOPIC PREGNANCY SURGERY  1971   FRACTURE SURGERY     nose   LEFT HEART CATH AND CORONARY ANGIOGRAPHY [name]  09/21/2018   Procedure: LEFT HEART CATH AND CORONARY ANGIOGRAPHY;  Surgeon: Marykay Lex, MD;  Location: [facility] INVASIVE CV LAB;  Service: Cardiovascular;  Laterality: [name];   LEFT HEART CATH AND CORONARY ANGIOGRAPHY [name] 11/23/2022   Procedure: LEFT HEART CATH AND CORONARY ANGIOGRAPHY;  Surgeon: Tonny Bollman, MD;  Location: [facility] INVASIVE CV LAB;  Service: Cardiovascular;  Laterality: [name];   MASTECTOMY Left 2016   NIPPLE SPARING MASTECTOMY Left 08/06/2015   Procedure: LEFT NIPPLE SPARING MASTECTOMY;  Surgeon: Harriette Bouillon, MD;  Location: [facility] OR;  Service: General;  Laterality: Left;   ORIF ANKLE FRACTURE Left 10/23/2013   Procedure: OPEN REDUCTION INTERNAL FIXATION (ORIF) LEFT ANKLE FRACTURE;  Surgeon: Cheral Almas, MD;  Location: [facility] OR;  Service: Orthopedics;  Laterality: Left;   PARTIAL HYSTERECTOMY     ovaries intact   PARTIAL MASTECTOMY WITH NEEDLE LOCALIZATION AND AXILLARY SENTINEL LYMPH NODE BX Left 05/06/2015   Procedure: LEFT BREAST PARTIAL MASTECTOMY WITH NEEDLE LOCALIZATION AND SENTINEL LYMPH NODE MAPPING;  Surgeon: Harriette Bouillon, MD;  Location: [facility];  Service: General;  Laterality: Left;   TONSILLECTOMY     TUBAL LIGATION      Allergies  Allergies  Allergen Reactions   Ace Inhibitors Swelling  H/o lip swelling with change in losartan pill   Angiotensin Receptor Blockers Swelling    H/o lip swelling with change in losartan pill   Statins Anaphylaxis    myalgias   Donepezil     Nightmares   Flonase [Fluticasone Propionate] Other (See Comments)    Intolerant of smell   Glipizide Other (See Comments)    unstready when on med.     Iron Other (See Comments)    Bloating with oral iron   Sulfa Antibiotics Other (See Comments)    Intolerant of sulfa but able to tolerate trimethoprim   Niacin Rash   Red Dye #40 (Allura Red) Rash    History of Present Illness    Kelly Kennedy has a PMH of essential hypertension, coronary artery disease,  PVCs, GERD, fatty liver, dysphagia, hypothyroidism, obesity, anxiety, and hyperlipidemia.  Her PMH also includes nonsustained atrial tachycardia, previous TIA, and probable previous pericarditis.  Coronary CT 10/19 showed high-grade stenosis in her mid left circumflex with FFR of 0.75.  This showed a calcium score of 274.  Cardiac catheterization did not confirm the presence of single severe stenosis of circumflex artery.  She was noted to have 45% stenosed distal circumflex lesion.  LVEF was 65%.  She was seen by Dr. Royann Shivers on 10/18/2021.  She reported taking occasional sublingual nitroglycerin for unpredictable episodes of chest tightness.  The nitroglycerin helps her symptoms resolve however, it takes about 15 minutes before the discomfort to go away.  She notices discomfort with doing housework like mopping or vacuuming.  She notes that her diastolic blood pressure at home is oftentimes in the 40s.  Her blood pressure in the office during her visit was 140/80.  She denied chest pain and dizziness.  She presented to the clinic 06/02/22 for follow-up evaluation stated she had not noticed any chest discomfort while doing increased physical activity.  She did note occasional chest discomfort after eating.   She did use occasional sublingual nitroglycerin which helps her chest discomfort dissipate after about 15-20 minutes.   Her daughter presented with her and indicated that she ate fried food regularly.  We reviewed her previous coronary CTA and cholesterol.  Her cholesterol was fairly well controlled with an LDL cholesterol of 57 on 10/04/2021.  We reviewed the importance of high-fiber diet.  I ordered a coronary CTA, have her increase her physical activity as tolerated, and plan follow-up for 6 months.  Coronary CTA was not completed.  She presented to the emergency department on 11/22/2022 and was complaining of dyspnea and chest discomfort.  She received nitroglycerin and 3 aspirin.  Her blood pressure was  noted to be significantly elevated at 262/120.  Her initial troponin came back elevated at 199 and on recheck was 353.  She was started on IV heparin.  Her chest x-ray showed no acute abnormalities.  Her EKG showed sinus tachycardia with inferior and lateral T wave inversion.  She underwent repeat cardiac catheterization 11/23/2022 which showed patent LAD with mild nonobstructive plaque, dominant circumflex with mild nonobstructive plaque and nondominant RCA without significant stenosis.  Medical management was recommended.  She presented to the clinic 12/02/2022 for follow-up evaluation stated she felt well .  She denied chest pain.  She had not been very active since the catheterization.  We reviewed her medications and her DVT as well as PE.  She reported compliance with her apixaban.   Her blood pressure initially was 181/91 and on recheck was 136/66.  We reviewed her  angiography results and they expressed understanding.  I asked her to gradually increase her physical activity, continue her current medications and diet and we  planned follow-up in 3 to 4 months.  She presents to the clinic today for follow-up evaluation and states she feels well today.  She reports that she was at the eye doctor yesterday and also had an injection.  She did notice some chest discomfort in the evening it was nonexertional.  She did take 1 sublingual nitroglycerin and her pain resolved.  She denies any further episodes of pain today.  She has been somewhat sedentary due to her right knee pain.  She did receive an injection in her knee and her knee is much better.  I encouraged her to increase her physical activity.  Her blood pressure is well-controlled today.  She presents with her daughter.  Her daughter reports a decreased appetite.  She has lost around 8 pounds.  I encouraged her to increase her calories in her diet and we will plan 6 months follow-up.  Today she denies chest pain, shortness of breath, lower extremity  edema, fatigue, palpitations, melena, hematuria, hemoptysis, diaphoresis, weakness, presyncope, syncope, orthopnea, and PND.   Home Medications    Prior to Admission medications   Medication Sig Start Date End Date Taking? Authorizing Provider  amitriptyline (ELAVIL) 50 MG tablet Take 1-2 tablets (50-100 mg total) by mouth at bedtime as needed. for sleep 02/07/22   Joaquim Nam, MD  amLODipine (NORVASC) 5 MG tablet Take 5 mg by mouth daily. 04/24/21   [provider]  aspirin EC 81 MG tablet Take 1 tablet (81 mg total) by mouth daily. Swallow whole. 02/12/21   Croitoru, Mihai, MD  carvedilol (COREG) 12.5 MG tablet TAKE 1 TABLET BY MOUTH 2 TIMES DAILY. 02/14/22   Croitoru, Mihai, MD  cyanocobalamin (,VITAMIN B-12,) 1000 MCG/ML injection Inject 1 mL (1,000 mcg total) into the muscle every 30 (thirty) days. 07/07/21   Joaquim Nam, MD  diclofenac Sodium (VOLTAREN) 1 % GEL Apply 2 g topically 4 (four) times daily as needed. 10/04/21   Joaquim Nam, MD  donepezil (ARICEPT ODT) 5 MG disintegrating tablet TAKE 1 TABLET BY MOUTH EVERYDAY AT BEDTIME 03/16/22   Joaquim Nam, MD  fluocinonide cream (LIDEX) 0.05 % APPLY TOPICALLY DAILY AS NEEDED. USE SPARINGLY. 02/13/22   Joaquim Nam, MD  isosorbide mononitrate (IMDUR) 30 MG 24 hr tablet TAKE 1 TABLET BY MOUTH EVERY DAY 02/22/22   Croitoru, Mihai, MD  levothyroxine (SYNTHROID) 75 MCG tablet TAKE 1 TABLET BY MOUTH DAILY BEFORE BREAKFAST. 12/24/21   Joaquim Nam, MD  Lifitegrast Benay Spice) 5 % SOLN Apply 1 drop to eye in the morning and at bedtime. Twice eyes    [provider]  LUMIGAN 0.01 % SOLN Place 1 drop into both eyes at bedtime. 06/29/19   [provider]  nitroGLYCERIN (NITROSTAT) 0.4 MG SL tablet Place 1 tablet (0.4 mg total) under the tongue every 5 (five) minutes as needed for chest pain. 02/12/21   Croitoru, Mihai, MD  omeprazole (PRILOSEC) 40 MG capsule TAKE 1 CAPSULE (40 MG TOTAL) BY MOUTH DAILY. 02/14/22    Meryl Dare, MD  REPATHA SURECLICK 140 MG/ML SOAJ ADMINISTER 1 ML UNDER THE SKIN EVERY 14 DAYS 09/14/21   Croitoru, Rachelle Hora, MD  solifenacin (VESICARE) 5 MG tablet Take 1 tablet (5 mg total) by mouth daily. 10/04/21   Joaquim Nam, MD    Family History    Family  History  Problem Relation Age of Onset   Cancer Father        stomach    Stomach cancer Father    Colon cancer Father    Heart disease Son        MI   Arthritis Mother    Diabetes Mother    Cancer Brother        lung cancer   Pancreatic cancer Neg Hx    Breast cancer Neg Hx    Esophageal cancer Neg Hx    Rectal cancer Neg Hx    She indicated that her mother is deceased. She indicated that her father is deceased. She indicated that her brother is deceased. She indicated that her son is deceased. She indicated that the status of her neg hx is unknown.  Social History    Social History   Socioeconomic History   Marital status: Widowed    Spouse name: Not on file   Number of children: 3   Years of education: Not on file   Highest education level: Not on file  Occupational History   Occupation: retired, previous Scientist, product/process development  Tobacco Use   Smoking status: Former    Current packs/day: 0.00    Types: Cigarettes    Quit date: 04/03/2017    Years since quitting: 6.4   Smokeless tobacco: Former   Tobacco comments:    PT STATES SHE DOES NOT SMOKE.  Vaping Use   Vaping status: Never Used  Substance and Sexual Activity   Alcohol use: No    Alcohol/week: 0.0 standard drinks of alcohol   Drug use: No   Sexual activity: Never  Other Topics Concern   Not on file  Social History Narrative   From [place].   3 children, one died from MI in 09-25-05   Married 1959-09-26   Husband and brother died in the same week in 2012/11/25      Patient is right handed.   Patient has hs education.   Patient drinks 1 cup of soda daily, and green tea.   Social Determinants of Health   Financial Resource Strain: Low Risk   (02/13/2023)   Overall Financial Resource Strain (CARDIA)    Difficulty of Paying Living Expenses: Not hard at all  Food Insecurity: Food Insecurity Present (02/13/2023)   Hunger Vital Sign    Worried About Running Out of Food in the Last Year: Sometimes true    Ran Out of Food in the Last Year: Sometimes true  Transportation Needs: No Transportation Needs (02/13/2023)   PRAPARE - Administrator, Civil Service (Medical): No    Lack of Transportation (Non-Medical): No  Physical Activity: Inactive (02/13/2023)   Exercise Vital Sign    Days of Exercise per Week: 0 days    Minutes of Exercise per Session: 0 min  Stress: No Stress Concern Present (02/13/2023)   [employer] of Occupational Health - Occupational Stress Questionnaire    Feeling of Stress : Only a little  Social Connections: Moderately Isolated (02/13/2023)   Social Connection and Isolation Panel [NHANES]    Frequency of Communication with Friends and Family: More than three times a week    Frequency of Social Gatherings with Friends and Family: More than three times a week    Attends Religious Services: More than 4 times per year    Active Member of [employer] or Organizations: No    Attends Banker Meetings: Never    Marital Status: Widowed  Intimate  Partner Violence: Not At Risk (02/13/2023)   Humiliation, Afraid, Rape, and Kick questionnaire    Fear of Current or Ex-Partner: No    Emotionally Abused: No    Physically Abused: No    Sexually Abused: No     Review of Systems    General:  No chills, fever, night sweats or weight changes.  Cardiovascular:  No chest pain, dyspnea on exertion, edema, orthopnea, palpitations, paroxysmal nocturnal dyspnea. Dermatological: No rash, lesions/masses Respiratory: No cough, dyspnea Urologic: No hematuria, dysuria Abdominal:   No nausea, vomiting, diarrhea, bright red blood per rectum, melena, or hematemesis Neurologic:  No visual changes, wkns, changes in  mental status. All other systems reviewed and are otherwise negative except as noted above.  Physical Exam    VS:  There were no vitals taken for this visit. , BMI There is no height or weight on file to calculate BMI. GEN: Well nourished, well developed, in no acute distress. HEENT: normal. Neck: Supple, no JVD, carotid bruits, or masses. Cardiac: RRR, no murmurs, rubs, or gallops. No clubbing, cyanosis, generalized bilateral lower extremity nonpitting edema.  Radials/DP/PT 2+ and equal bilaterally.  Respiratory:  Respirations regular and unlabored, clear to auscultation bilaterally. GI: Soft, nontender, nondistended, BS + x 4. MS: no deformity or atrophy. Skin: warm and dry, no rash. Neuro:  Strength and sensation are intact. Psych: Normal affect.  Accessory Clinical Findings    Recent Labs: 11/22/2022: Magnesium 1.9 11/23/2022: TSH 1.322 07/03/2023: ALT 10; BUN 8; Creatinine, Ser 0.89; Hemoglobin 12.7; Platelets 327.0; Potassium 3.7; Sodium 144   Recent Lipid Panel    Component Value Date/Time   CHOL 160 03/28/2023 1628   CHOL 121 10/30/2019 0915   TRIG 94.0 03/28/2023 1628   TRIG 203 10/03/2012 0000   HDL 69.50 03/28/2023 1628   HDL 72 10/30/2019 0915   CHOLHDL 2 03/28/2023 1628   VLDL 18.8 03/28/2023 1628   LDLCALC 72 03/28/2023 1628   LDLCALC 26 10/30/2019 0915   LDLDIRECT 154.0 10/27/2017 0756    ECG personally reviewed by me today-none today.  EKG 06/02/2022 normal sinus rhythm LVH possible lateral infarct undetermined age 35 bpm- No acute changes  Cardiac catheterization 09/21/2018 Dist Cx lesion is 45% stenosed -angiographically moderate at best disease. Does not explain CT scan. There is hyperdynamic left ventricular systolic function. The left ventricular ejection fraction is greater than 65% by visual estimate. LV end diastolic pressure is normal.   Mild to moderate disease distal dominant Circumflex.  There is no culprit lesion to explain CT scan  findings. Otherwise essentially normal coronary arteries and a left dominant system. Normal LVEF with hyperdynamic function.  The patient had a brief run of PAT/SVT during injection.   Patient will return to short stay holding area for ongoing care. Would recommend beta-blocker.  The patient felt palpitations with PVCs and brief runs of PAT with left ventricular hemodynamics and LV gram.  She describes her chest discomfort as the symptoms noted with documented ectopy and brief PAT runs.   She will be ready for discharge home after bedrest.   Recommend Aspirin 81mg  daily for moderate CAD.      Bryan Lemma, MD  Diagnostic Dominance: Left  Intervention    Left Heart Catheterization 11/23/22 Patent left main with no significant stenosis Patent LAD with mild non-obstructive plaque at the first diagonal Patent, dominant LCx with mild nonobstructive plaque in the proximal and mid-vessel Nondominant RCA without significant stenosis Normal LVEDP   Recommend: medical therapy for nonobstructive CAD.  Consider non-cardiac symptoms.  Diagnostic Dominance: Left    Echocardiogram 11/23/22  1. Left ventricular ejection fraction, by estimation, is 60 to 65%. The  left ventricle has normal function. The left ventricle has no regional  wall motion abnormalities. There is moderate asymmetric left ventricular  hypertrophy of the basal-septal  segment. Left ventricular diastolic parameters are consistent with Grade I  diastolic dysfunction (impaired relaxation).   2. Right ventricular systolic function is normal. The right ventricular  size is normal.   3. The mitral valve is grossly normal. Trivial mitral valve  regurgitation. No evidence of mitral stenosis.   4. The aortic valve is tricuspid. There is mild calcification of the  aortic valve. There is mild thickening of the aortic valve. Aortic valve  regurgitation is not visualized. Aortic valve sclerosis/calcification is  present,  without any evidence of  aortic stenosis.   5. The inferior vena cava is dilated in size with <50% respiratory  variability, suggesting right atrial pressure of 15 mmHg.   Comparison(s): Compared to prior TTE report in 2016, the LVEF appears more  robust (previously reported as 50-55%). Otherwise, there is no significant  change.   Assessment & Plan   1.  Coronary artery disease-without chest pain today.  Reports that she did take a nitroglycerin last night.  Pain was atypical and not with exertion.  Appears to be related to GI issue.  Cardiac catheterization 10/2722 and was noted to have mild nonobstructive plaque.  Medical management was recommended Continue amlodipine, carvedilol, Imdur, nitroglycerin, Repatha Heart healthy low-sodium diet-reviewed Maintain physical activity  Hyperlipidemia-03/28/2023: Cholesterol 160; HDL 69.50; LDL Cholesterol 72; Triglycerides 94.0; VLDL 18.8 Continue Repatha Heart healthy low-sodium high-fiber diet Increase physical activity gradually Repeat fasting lipids and LFTs 12/24    Essential hypertension-BP today 134/84.   Maintain blood pressure log Continue amlodipine, carvedilol, Imdur Heart healthy low-sodium diet-salty 6 given Increase physical activity gradually  Type 2 diabetes-glucose 85 on 12/01/22 Heart healthy low-sodium carb modified diet Follows with PCP  Disposition: Follow-up with Dr. Royann Shivers  in 4-6 months.     Thomasene Ripple. Mersedes Alber NP-C     09/04/2023, 10:24 AM [facility] [address] Office [phone] Fax [phone]  Notice: This dictation was prepared with Dragon dictation along with smaller phrase technology. Any transcriptional errors that result from this process are unintentional and may not be corrected upon review.  I spent 14*** minutes examining this patient, reviewing medications, and using patient centered shared decision making involving her cardiac care.  Prior to her  visit I spent greater than 20 minutes reviewing her past medical history,  medications, and prior cardiac tests.

## 2023-09-06 ENCOUNTER — Ambulatory Visit: Payer: 59 | Admitting: General Practice

## 2023-09-18 DIAGNOSIS — H34831 Tributary (branch) retinal vein occlusion, right eye, with macular edema: Secondary | ICD-10-CM | POA: Diagnosis not present

## 2023-09-22 ENCOUNTER — Ambulatory Visit: Payer: Self-pay

## 2023-09-22 NOTE — Patient Outreach (Signed)
Care Coordination   Follow Up Visit Note   09/22/2023 Name: Kelly Kennedy MRN: [id number] DOB: 06/25/1941  Kelly Kennedy is a 82 y.o. year old female who sees Joaquim Nam, MD for primary care. I spoke with  Yvaine Maisie Fus  and [name] by phone today.  What matters to the patients health and wellness today?  Patient states she is doing about the same.  She states her breathing is somewhat better and denies having any chest pain.  Patient states she checks her BP 1-2 times per week.  She reports blood pressures mainly ranging in the 140's/80's. Patient states her memory issues are about the same, no change.  Daughter states patients doctor change memory medication from donepezil to [place].  Daughter states patient complains of not being able to remember anything while taking Namenda therefore not seeming to tolerate medication well.      Goals Addressed             This Visit's Progress    Develop plan of care for management of coronary artery disease/ memory concerns       Interventions Today    Flowsheet Row Most Recent Value  Chronic Disease   Chronic disease during today's visit Other  [CAD, memory changes.]  General Interventions   General Interventions Discussed/Reviewed General Interventions Reviewed, Doctor Visits  [evaluation of current treatment plan for mentioned health conditionsa and patients adherence to plan as established by provider. Assessed for SOB/ CP and status of memory issues.]  Doctor Visits Discussed/Reviewed Doctor Visits Reviewed  Annabell Sabal upcoming provider visits.]  Education Interventions   Education Provided Provided Education  [advised to notify provider of any increase in SOB or chest discomfort / pain.  Advised to call 911 for severe symptoms.]  Pharmacy Interventions   Pharmacy Dicussed/Reviewed Pharmacy Topics Reviewed  [medications and medication adjustments reviewed and discussed with daughter.  Advised daughter to  send message to patients primary care provider office to have nitroglycerin  renewed. Also advised to discuss adverse effects of namenda with Dr.]               SDOH assessments and interventions completed:  No     Care Coordination Interventions:  Yes, provided   Follow up plan: Follow up call scheduled for 11/07/23    Encounter Outcome:  Patient Visit Completed  George Ina RN,BSN,CCM [facility] Care Coordination [phone] direct line

## 2023-09-22 NOTE — Patient Instructions (Signed)
Visit Information  Thank you for taking time to visit with me today. Please don't hesitate to contact me if I can be of assistance to you.   Following are the goals we discussed today:   Goals Addressed             This Visit's Progress    Develop plan of care for management of coronary artery disease/ memory concerns       Interventions Today    Flowsheet Row Most Recent Value  Chronic Disease   Chronic disease during today's visit Other  [CAD, memory changes.]  General Interventions   General Interventions Discussed/Reviewed General Interventions Reviewed, Doctor Visits  [evaluation of current treatment plan for mentioned health conditionsa and patients adherence to plan as established by provider. Assessed for SOB/ CP and status of memory issues.]  Doctor Visits Discussed/Reviewed Doctor Visits Reviewed  Annabell Sabal upcoming provider visits.]  Education Interventions   Education Provided Provided Education  [advised to notify provider of any increase in SOB or chest discomfort / pain.  Advised to call 911 for severe symptoms.]  Pharmacy Interventions   Pharmacy Dicussed/Reviewed Pharmacy Topics Reviewed  [medications and medication adjustments reviewed and discussed with daughter.  Advised daughter to send message to patients primary care provider office to have nitroglycerin  renewed. Also advised to discuss adverse effects of namenda with Dr.]               Our next appointment is by telephone on 11/07/23 at 11 am  Please call the care guide team at [phone] if you need to cancel or reschedule your appointment.   If you are experiencing a Mental Health or Behavioral Health Crisis or need someone to talk to, please call the Suicide and Crisis Lifeline: 988 call 1-800-273-TALK (toll free, 24 hour hotline)  Patient verbalizes understanding of instructions and care plan provided today and agrees to view in MyChart. Active MyChart status and patient understanding of how to  access instructions and care plan via MyChart confirmed with patient.     George Ina RN,BSN,CCM [facility] Care Coordination [phone] direct line

## 2023-10-03 ENCOUNTER — Other Ambulatory Visit: Payer: Self-pay | Admitting: Cardiovascular Disease

## 2023-10-03 ENCOUNTER — Other Ambulatory Visit: Payer: Self-pay | Admitting: Family Medicine

## 2023-10-10 NOTE — Progress Notes (Unsigned)
Cardiology Clinic Note   Patient Name: Kelly Kennedy Date of Encounter: 10/12/2023  Primary Care Provider:  Joaquim Nam, MD Primary Cardiologist:  Thurmon Fair, MD  Patient Profile    Kelly Kennedy 82 year old female presents the clinic today for follow-up evaluation of her  coronary artery disease.  Past Medical History    Past Medical History:  Diagnosis Date   adenomatous Colon polyps    colonoscopy 8/99, 12/02, 8/06   Arthritis    Bell's palsy    Cancer (HCC)    DCIS left breast   Cataract    Chest pain    uses NTG as needed   Depression    Diabetes mellitus without complication (HCC)    Diverticulosis of colon 06/2005   Ductal carcinoma in situ (DCIS) of left breast    Family history of adverse reaction to anesthesia    " my mother didn't wake up so they had to put her on life support for about an hour or more; my sisiter has problems waking up too from anesthesia.   GERD (gastroesophageal reflux disease)    Glaucoma    H/O hiatal hernia    Headache(784.0)    Hyperglycemia    Hyperlipidemia    Hypertension    Hypothyroid    Insomnia    Irritable bowel    Menopausal symptoms    Pericarditis 1980's   Stroke [facility])    TIA (transient ischemic attack)    09/2014   Past Surgical History:  Procedure Laterality Date   APPENDECTOMY     BREAST EXCISIONAL BIOPSY Right    benign   BREAST RECONSTRUCTION WITH PLACEMENT OF TISSUE EXPANDER AND FLEX HD (ACELLULAR HYDRATED DERMIS) Left 08/06/2015   Procedure: LEFT BREAST RECONSTRUCTION WITH PLACEMENT OF SALINE IMPLANT AND ACELLULAR DURMAL MATRIX;  Surgeon: Etter Sjogren, MD;  Location: [facility] OR;  Service: Plastics;  Laterality: Left;   CARDIAC CATHETERIZATION     CATARACT EXTRACTION Bilateral 2016   CHOLECYSTECTOMY  1990   COLONOSCOPY     CYSTECTOMY  05/04/2008   left lower arm, benign   ECTOPIC PREGNANCY SURGERY  1971   FRACTURE SURGERY     nose   LEFT HEART CATH AND CORONARY ANGIOGRAPHY [name]  09/21/2018   Procedure: LEFT HEART CATH AND CORONARY ANGIOGRAPHY;  Surgeon: Marykay Lex, MD;  Location: [facility] INVASIVE CV LAB;  Service: Cardiovascular;  Laterality: [name];   LEFT HEART CATH AND CORONARY ANGIOGRAPHY [name] 11/23/2022   Procedure: LEFT HEART CATH AND CORONARY ANGIOGRAPHY;  Surgeon: Tonny Bollman, MD;  Location: [facility] INVASIVE CV LAB;  Service: Cardiovascular;  Laterality: [name];   MASTECTOMY Left 2016   NIPPLE SPARING MASTECTOMY Left 08/06/2015   Procedure: LEFT NIPPLE SPARING MASTECTOMY;  Surgeon: Harriette Bouillon, MD;  Location: [facility] OR;  Service: General;  Laterality: Left;   ORIF ANKLE FRACTURE Left 10/23/2013   Procedure: OPEN REDUCTION INTERNAL FIXATION (ORIF) LEFT ANKLE FRACTURE;  Surgeon: Cheral Almas, MD;  Location: [facility] OR;  Service: Orthopedics;  Laterality: Left;   PARTIAL HYSTERECTOMY     ovaries intact   PARTIAL MASTECTOMY WITH NEEDLE LOCALIZATION AND AXILLARY SENTINEL LYMPH NODE BX Left 05/06/2015   Procedure: LEFT BREAST PARTIAL MASTECTOMY WITH NEEDLE LOCALIZATION AND SENTINEL LYMPH NODE MAPPING;  Surgeon: Harriette Bouillon, MD;  Location: [facility];  Service: General;  Laterality: Left;   TONSILLECTOMY     TUBAL LIGATION      Allergies  Allergies  Allergen Reactions   Ace Inhibitors Swelling  H/o lip swelling with change in losartan pill   Angiotensin Receptor Blockers Swelling    H/o lip swelling with change in losartan pill   Statins Anaphylaxis    myalgias   Donepezil     Nightmares   Flonase [Fluticasone Propionate] Other (See Comments)    Intolerant of smell   Glipizide Other (See Comments)    unstready when on med.     Iron Other (See Comments)    Bloating with oral iron   Sulfa Antibiotics Other (See Comments)    Intolerant of sulfa but able to tolerate trimethoprim   Niacin Rash   Red Dye #40 (Allura Red) Rash    History of Present Illness    Kelly Kennedy has a PMH of essential hypertension, coronary artery disease,  PVCs, GERD, fatty liver, dysphagia, hypothyroidism, obesity, anxiety, and hyperlipidemia.  Her PMH also includes nonsustained atrial tachycardia, previous TIA, and probable previous pericarditis.  Coronary CT 10/19 showed high-grade stenosis in her mid left circumflex with FFR of 0.75.  This showed a calcium score of 274.  Cardiac catheterization did not confirm the presence of single severe stenosis of circumflex artery.  She was noted to have 45% stenosed distal circumflex lesion.  LVEF was 65%.  She was seen by Dr. Royann Shivers on 10/18/2021.  She reported taking occasional sublingual nitroglycerin for unpredictable episodes of chest tightness.  The nitroglycerin helps her symptoms resolve however, it takes about 15 minutes before the discomfort to go away.  She notices discomfort with doing housework like mopping or vacuuming.  She notes that her diastolic blood pressure at home is oftentimes in the 40s.  Her blood pressure in the office during her visit was 140/80.  She denied chest pain and dizziness.  She presented to the clinic 06/02/22 for follow-up evaluation stated she had not noticed any chest discomfort while doing increased physical activity.  She did note occasional chest discomfort after eating.   She did use occasional sublingual nitroglycerin which helps her chest discomfort dissipate after about 15-20 minutes.   Her daughter presented with her and indicated that she ate fried food regularly.  We reviewed her previous coronary CTA and cholesterol.  Her cholesterol was fairly well controlled with an LDL cholesterol of 57 on 10/04/2021.  We reviewed the importance of high-fiber diet.  I ordered a coronary CTA, have her increase her physical activity as tolerated, and plan follow-up for 6 months.  Coronary CTA was not completed.  She presented to the emergency department on 11/22/2022 and was complaining of dyspnea and chest discomfort.  She received nitroglycerin and 3 aspirin.  Her blood pressure was  noted to be significantly elevated at 262/120.  Her initial troponin came back elevated at 199 and on recheck was 353.  She was started on IV heparin.  Her chest x-ray showed no acute abnormalities.  Her EKG showed sinus tachycardia with inferior and lateral T wave inversion.  She underwent repeat cardiac catheterization 11/23/2022 which showed patent LAD with mild nonobstructive plaque, dominant circumflex with mild nonobstructive plaque and nondominant RCA without significant stenosis.  Medical management was recommended.  She presented to the clinic 12/02/2022 for follow-up evaluation stated she felt well .  She denied chest pain.  She had not been very active since the catheterization.  We reviewed her medications and her DVT as well as PE.  She reported compliance with her apixaban.   Her blood pressure initially was 181/91 and on recheck was 136/66.  We reviewed her  angiography results and they expressed understanding.  I asked her to gradually increase her physical activity, continue her current medications and diet and we  planned follow-up in 3 to 4 months.  She presented to the clinic 03/09/23 for follow-up evaluation and stated she felt well.  She reported that she was at the eye doctor 03/08/23 and  had an injection.  She did notice some chest discomfort in the evening which was nonexertional.  She did take 1 sublingual nitroglycerin and her pain resolved.  She denied any further episodes of pain.  She had been somewhat sedentary due to her right knee pain.  She did receive an injection in her knee and her knee was much better.  I encouraged her to increase her physical activity.  Her blood pressure was well-controlled.  She presented with her daughter.  Her daughter reported a decreased appetite.  She had lost around 8 pounds.  I encouraged her to increase her calories in her diet and we  planned 6 months follow-up.  She presents to the clinic today for follow-up evaluation and states she continues  to be somewhat physically active doing things around her house.  Her blood pressure is well-controlled today at 110/70.  We reviewed her medication regimen.  She denies chest discomfort.  I will refill her sublingual nitroglycerin.  She does note occasional dizziness with standing up.  She presents with her support stockings on today.  I have asked her to continue her heart healthy low-sodium diet, continue to maintain her physical activity, and we will plan follow-up in 6 to 9 months.  Today she denies chest pain, shortness of breath, lower extremity edema, fatigue, palpitations, melena, hematuria, hemoptysis, diaphoresis, weakness, presyncope, syncope, orthopnea, and PND.   Home Medications    Prior to Admission medications   Medication Sig Start Date End Date Taking? Authorizing Provider  amitriptyline (ELAVIL) 50 MG tablet Take 1-2 tablets (50-100 mg total) by mouth at bedtime as needed. for sleep 02/07/22   Joaquim Nam, MD  amLODipine (NORVASC) 5 MG tablet Take 5 mg by mouth daily. 04/24/21   [provider]  aspirin EC 81 MG tablet Take 1 tablet (81 mg total) by mouth daily. Swallow whole. 02/12/21   Croitoru, Mihai, MD  carvedilol (COREG) 12.5 MG tablet TAKE 1 TABLET BY MOUTH 2 TIMES DAILY. 02/14/22   Croitoru, Mihai, MD  cyanocobalamin (,VITAMIN B-12,) 1000 MCG/ML injection Inject 1 mL (1,000 mcg total) into the muscle every 30 (thirty) days. 07/07/21   Joaquim Nam, MD  diclofenac Sodium (VOLTAREN) 1 % GEL Apply 2 g topically 4 (four) times daily as needed. 10/04/21   Joaquim Nam, MD  donepezil (ARICEPT ODT) 5 MG disintegrating tablet TAKE 1 TABLET BY MOUTH EVERYDAY AT BEDTIME 03/16/22   Joaquim Nam, MD  fluocinonide cream (LIDEX) 0.05 % APPLY TOPICALLY DAILY AS NEEDED. USE SPARINGLY. 02/13/22   Joaquim Nam, MD  isosorbide mononitrate (IMDUR) 30 MG 24 hr tablet TAKE 1 TABLET BY MOUTH EVERY DAY 02/22/22   Croitoru, Mihai, MD  levothyroxine (SYNTHROID) 75 MCG tablet  TAKE 1 TABLET BY MOUTH DAILY BEFORE BREAKFAST. 12/24/21   Joaquim Nam, MD  Lifitegrast Benay Spice) 5 % SOLN Apply 1 drop to eye in the morning and at bedtime. Twice eyes    [provider]  LUMIGAN 0.01 % SOLN Place 1 drop into both eyes at bedtime. 06/29/19   [provider]  nitroGLYCERIN (NITROSTAT) 0.4 MG SL tablet Place 1 tablet (0.4  mg total) under the tongue every 5 (five) minutes as needed for chest pain. 02/12/21   Croitoru, Mihai, MD  omeprazole (PRILOSEC) 40 MG capsule TAKE 1 CAPSULE (40 MG TOTAL) BY MOUTH DAILY. 02/14/22   Meryl Dare, MD  REPATHA SURECLICK 140 MG/ML SOAJ ADMINISTER 1 ML UNDER THE SKIN EVERY 14 DAYS 09/14/21   Croitoru, Rachelle Hora, MD  solifenacin (VESICARE) 5 MG tablet Take 1 tablet (5 mg total) by mouth daily. 10/04/21   Joaquim Nam, MD    Family History    Family History  Problem Relation Age of Onset   Cancer Father        stomach    Stomach cancer Father    Colon cancer Father    Heart disease Son        MI   Arthritis Mother    Diabetes Mother    Cancer Brother        lung cancer   Pancreatic cancer Neg Hx    Breast cancer Neg Hx    Esophageal cancer Neg Hx    Rectal cancer Neg Hx    She indicated that her mother is deceased. She indicated that her father is deceased. She indicated that her brother is deceased. She indicated that her son is deceased. She indicated that the status of her neg hx is unknown.  Social History    Social History   Socioeconomic History   Marital status: Widowed    Spouse name: Not on file   Number of children: 3   Years of education: Not on file   Highest education level: Not on file  Occupational History   Occupation: retired, previous Scientist, product/process development  Tobacco Use   Smoking status: Former    Current packs/day: 0.00    Types: Cigarettes    Quit date: 04/03/2017    Years since quitting: 6.5   Smokeless tobacco: Former   Tobacco comments:    PT STATES SHE DOES NOT SMOKE.  Vaping Use    Vaping status: Never Used  Substance and Sexual Activity   Alcohol use: No    Alcohol/week: 0.0 standard drinks of alcohol   Drug use: No   Sexual activity: Never  Other Topics Concern   Not on file  Social History Narrative   From [place].   3 children, one died from MI in 24-Oct-2005   Married 10/25/59   Husband and brother died in the same week in 23-Nov-2012      Patient is right handed.   Patient has hs education.   Patient drinks 1 cup of soda daily, and green tea.   Social Determinants of Health   Financial Resource Strain: Low Risk  (02/13/2023)   Overall Financial Resource Strain (CARDIA)    Difficulty of Paying Living Expenses: Not hard at all  Food Insecurity: Food Insecurity Present (02/13/2023)   Hunger Vital Sign    Worried About Running Out of Food in the Last Year: Sometimes true    Ran Out of Food in the Last Year: Sometimes true  Transportation Needs: No Transportation Needs (02/13/2023)   PRAPARE - Administrator, Civil Service (Medical): No    Lack of Transportation (Non-Medical): No  Physical Activity: Inactive (02/13/2023)   Exercise Vital Sign    Days of Exercise per Week: 0 days    Minutes of Exercise per Session: 0 min  Stress: No Stress Concern Present (02/13/2023)   [employer] of Occupational Health - Occupational Stress Questionnaire  Feeling of Stress : Only a little  Social Connections: Moderately Isolated (02/13/2023)   Social Connection and Isolation Panel [NHANES]    Frequency of Communication with Friends and Family: More than three times a week    Frequency of Social Gatherings with Friends and Family: More than three times a week    Attends Religious Services: More than 4 times per year    Active Member of [employer] or Organizations: No    Attends Banker Meetings: Never    Marital Status: Widowed  Intimate Partner Violence: Not At Risk (02/13/2023)   Humiliation, Afraid, Rape, and Kick questionnaire    Fear of Current  or Ex-Partner: No    Emotionally Abused: No    Physically Abused: No    Sexually Abused: No     Review of Systems    General:  No chills, fever, night sweats or weight changes.  Cardiovascular:  No chest pain, dyspnea on exertion, edema, orthopnea, palpitations, paroxysmal nocturnal dyspnea. Dermatological: No rash, lesions/masses Respiratory: No cough, dyspnea Urologic: No hematuria, dysuria Abdominal:   No nausea, vomiting, diarrhea, bright red blood per rectum, melena, or hematemesis Neurologic:  No visual changes, wkns, changes in mental status. All other systems reviewed and are otherwise negative except as noted above.  Physical Exam    VS:  BP 110/70 (BP Location: Right Arm, Patient Position: Sitting, Cuff Size: Normal)   Pulse 88   Ht 5\' 5"  (1.651 m)   Wt 165 lb (74.8 kg)   BMI 27.46 kg/m  , BMI Body mass index is 27.46 kg/m. GEN: Well nourished, well developed, in no acute distress. HEENT: normal. Neck: Supple, no JVD, carotid bruits, or masses. Cardiac: RRR, no murmurs, rubs, or gallops. No clubbing, cyanosis, generalized bilateral lower extremity nonpitting edema.  Radials/DP/PT 2+ and equal bilaterally.  Respiratory:  Respirations regular and unlabored, clear to auscultation bilaterally. GI: Soft, nontender, nondistended, BS + x 4. MS: no deformity or atrophy. Skin: warm and dry, no rash. Neuro:  Strength and sensation are intact. Psych: Normal affect.  Accessory Clinical Findings    Recent Labs: 11/22/2022: Magnesium 1.9 11/23/2022: TSH 1.322 07/03/2023: ALT 10; BUN 8; Creatinine, Ser 0.89; Hemoglobin 12.7; Platelets 327.0; Potassium 3.7; Sodium 144   Recent Lipid Panel    Component Value Date/Time   CHOL 160 03/28/2023 1628   CHOL 121 10/30/2019 0915   TRIG 94.0 03/28/2023 1628   TRIG 203 10/03/2012 0000   HDL 69.50 03/28/2023 1628   HDL 72 10/30/2019 0915   CHOLHDL 2 03/28/2023 1628   VLDL 18.8 03/28/2023 1628   LDLCALC 72 03/28/2023 1628   LDLCALC  26 10/30/2019 0915   LDLDIRECT 154.0 10/27/2017 0756    ECG personally reviewed by me today- EKG Interpretation Date/Time:  Thursday October 12 2023 15:37:13 EST Ventricular Rate:  88 PR Interval:  132 QRS Duration:  84 QT Interval:  378 QTC Calculation: 457 R Axis:   32  Text Interpretation: Normal sinus rhythm Nonspecific T wave abnormality When compared with ECG of 24-Nov-2022 06:40, Nonspecific T wave abnormality now evident in Lateral leads Confirmed by Edd Fabian [phone]) on 10/12/2023 3:42:41 PM   EKG 06/02/2022 normal sinus rhythm LVH possible lateral infarct undetermined age 62 bpm- No acute changes  Cardiac catheterization 09/21/2018 Dist Cx lesion is 45% stenosed -angiographically moderate at best disease. Does not explain CT scan. There is hyperdynamic left ventricular systolic function. The left ventricular ejection fraction is greater than 65% by visual estimate. LV end diastolic pressure  is normal.   Mild to moderate disease distal dominant Circumflex.  There is no culprit lesion to explain CT scan findings. Otherwise essentially normal coronary arteries and a left dominant system. Normal LVEF with hyperdynamic function.  The patient had a brief run of PAT/SVT during injection.   Patient will return to short stay holding area for ongoing care. Would recommend beta-blocker.  The patient felt palpitations with PVCs and brief runs of PAT with left ventricular hemodynamics and LV gram.  She describes her chest discomfort as the symptoms noted with documented ectopy and brief PAT runs.   She will be ready for discharge home after bedrest.   Recommend Aspirin 81mg  daily for moderate CAD.      Bryan Lemma, MD  Diagnostic Dominance: Left  Intervention    Left Heart Catheterization 11/23/22 Patent left main with no significant stenosis Patent LAD with mild non-obstructive plaque at the first diagonal Patent, dominant LCx with mild nonobstructive plaque in the  proximal and mid-vessel Nondominant RCA without significant stenosis Normal LVEDP   Recommend: medical therapy for nonobstructive CAD. Consider non-cardiac symptoms.  Diagnostic Dominance: Left    Echocardiogram 11/23/22  1. Left ventricular ejection fraction, by estimation, is 60 to 65%. The  left ventricle has normal function. The left ventricle has no regional  wall motion abnormalities. There is moderate asymmetric left ventricular  hypertrophy of the basal-septal  segment. Left ventricular diastolic parameters are consistent with Grade I  diastolic dysfunction (impaired relaxation).   2. Right ventricular systolic function is normal. The right ventricular  size is normal.   3. The mitral valve is grossly normal. Trivial mitral valve  regurgitation. No evidence of mitral stenosis.   4. The aortic valve is tricuspid. There is mild calcification of the  aortic valve. There is mild thickening of the aortic valve. Aortic valve  regurgitation is not visualized. Aortic valve sclerosis/calcification is  present, without any evidence of  aortic stenosis.   5. The inferior vena cava is dilated in size with <50% respiratory  variability, suggesting right atrial pressure of 15 mmHg.   Comparison(s): Compared to prior TTE report in 2016, the LVEF appears more  robust (previously reported as 50-55%). Otherwise, there is no significant  change.   Assessment & Plan   1.  Essential hypertension-BP today 110/70   Continue amlodipine, carvedilol, Imdur Heart healthy low-sodium diet-salty 6 reviewed Maintain blood pressure log  Coronary artery disease-denies chest discomfort.  No recent nitroglycerin use.  With cardiac catheterization 10/2722 and was noted to have mild nonobstructive plaque.  Medical management was recommended Continue amlodipine, carvedilol, Imdur, nitroglycerin, Repatha Heart healthy low-sodium diet Maintain physical activity Refill sublingual  nitroglycerin  Hyperlipidemia-03/28/2023: Cholesterol 160; HDL 69.50; LDL Cholesterol 72; Triglycerides 94.0; VLDL 18.8 Continue Repatha Heart healthy low-sodium high-fiber diet Increase physical activity gradually Repeat fasting lipids and LFTs 4/25    Type 2 diabetes-glucose 105 on 07/03/23 Heart healthy low-sodium carb modified diet Follows with PCP  Disposition: Follow-up with Dr. Royann Shivers  in 6-9 months.     Thomasene Ripple. Habib Kise NP-C     10/12/2023, 4:15 PM [facility] [address] Office [phone] Fax [phone]  Notice: This dictation was prepared with Dragon dictation along with smaller phrase technology. Any transcriptional errors that result from this process are unintentional and may not be corrected upon review.  I spent 14 minutes examining this patient, reviewing medications, and using patient centered shared decision making involving her cardiac care.  Prior  to her visit I spent greater than 20 minutes reviewing her past medical history,  medications, and prior cardiac tests.

## 2023-10-12 ENCOUNTER — Ambulatory Visit: Payer: 59 | Attending: General Practice | Admitting: General Practice

## 2023-10-12 ENCOUNTER — Encounter: Payer: Self-pay | Admitting: General Practice

## 2023-10-12 VITALS — BP 110/70 | HR 88 | Ht 65.0 in | Wt 165.0 lb

## 2023-10-12 DIAGNOSIS — E7800 Pure hypercholesterolemia, unspecified: Secondary | ICD-10-CM | POA: Diagnosis not present

## 2023-10-12 DIAGNOSIS — I25118 Atherosclerotic heart disease of native coronary artery with other forms of angina pectoris: Secondary | ICD-10-CM

## 2023-10-12 DIAGNOSIS — I214 Non-ST elevation (NSTEMI) myocardial infarction: Secondary | ICD-10-CM

## 2023-10-12 DIAGNOSIS — E119 Type 2 diabetes mellitus without complications: Secondary | ICD-10-CM

## 2023-10-12 DIAGNOSIS — I10 Essential (primary) hypertension: Secondary | ICD-10-CM

## 2023-10-12 DIAGNOSIS — I493 Ventricular premature depolarization: Secondary | ICD-10-CM

## 2023-10-12 MED ORDER — NITROGLYCERIN 0.4 MG SL SUBL: 0.4000 mg | SUBLINGUAL_TABLET | SUBLINGUAL | 2 refills | Status: DC | PRN

## 2023-10-12 NOTE — Patient Instructions (Signed)
Medication Instructions:  The current medical regimen is effective;  continue present plan and medications as directed. Please refer to the Current Medication list given to you today.  *If you need a refill on your cardiac medications before your next appointment, please call your pharmacy*  Lab Work: FASTING LIPID AND LFT IN APRIL 2025  Other Instructions USE DEBROX FOR EARS AS NEEDED MAINTAIN PHYSICAL ACTIVITY  Follow-Up: At [facility], you and your health needs are our priority.  As part of our continuing mission to provide you with exceptional heart care, we have created designated Provider Care Teams.  These Care Teams include your primary Cardiologist (physician) and Advanced Practice Providers (APPs -  Physician Assistants and Nurse Practitioners) who all work together to provide you with the care you need, when you need it.  Your next appointment:   6-9 month(s)  Provider:   Thurmon Fair, MD  or Edd Fabian, FNP

## 2023-10-14 ENCOUNTER — Other Ambulatory Visit: Payer: Self-pay

## 2023-10-14 ENCOUNTER — Emergency Department (HOSPITAL_COMMUNITY)
Admission: EM | Admit: 2023-10-14 | Discharge: 2023-10-14 | Disposition: A | Discharge status: Home / Self Care | Payer: 59 | Attending: Emergency Medicine | Admitting: Emergency Medicine

## 2023-10-14 ENCOUNTER — Encounter (HOSPITAL_COMMUNITY): Payer: Self-pay

## 2023-10-14 DIAGNOSIS — R197 Diarrhea, unspecified: Secondary | ICD-10-CM | POA: Insufficient documentation

## 2023-10-14 DIAGNOSIS — I959 Hypotension, unspecified: Secondary | ICD-10-CM | POA: Diagnosis not present

## 2023-10-14 DIAGNOSIS — R42 Dizziness and giddiness: Secondary | ICD-10-CM | POA: Diagnosis not present

## 2023-10-14 DIAGNOSIS — I10 Essential (primary) hypertension: Secondary | ICD-10-CM | POA: Diagnosis not present

## 2023-10-14 DIAGNOSIS — E876 Hypokalemia: Secondary | ICD-10-CM | POA: Diagnosis not present

## 2023-10-14 DIAGNOSIS — R55 Syncope and collapse: Secondary | ICD-10-CM | POA: Diagnosis not present

## 2023-10-14 DIAGNOSIS — Z79899 Other long term (current) drug therapy: Secondary | ICD-10-CM | POA: Diagnosis not present

## 2023-10-14 LAB — CBC WITH DIFFERENTIAL/PLATELET
Abs Immature Granulocytes: 0.04 10*3/uL (ref 0.00–0.07)
Basophils Absolute: 0 10*3/uL (ref 0.0–0.1)
Basophils Relative: 0 %
Eosinophils Absolute: 0 10*3/uL (ref 0.0–0.5)
Eosinophils Relative: 0 %
HCT: 36.9 % (ref 36.0–46.0)
Hemoglobin: 12.1 g/dL (ref 12.0–15.0)
Immature Granulocytes: 1 %
Lymphocytes Relative: 28 %
Lymphs Abs: 1.9 10*3/uL (ref 0.7–4.0)
MCH: 29.3 pg (ref 26.0–34.0)
MCHC: 32.8 g/dL (ref 30.0–36.0)
MCV: 89.3 fL (ref 80.0–100.0)
Monocytes Absolute: 0.5 10*3/uL (ref 0.1–1.0)
Monocytes Relative: 8 %
Neutro Abs: 4.3 10*3/uL (ref 1.7–7.7)
Neutrophils Relative %: 63 %
Platelets: 302 10*3/uL (ref 150–400)
RBC: 4.13 MIL/uL (ref 3.87–5.11)
RDW: 15.6 % — ABNORMAL HIGH (ref 11.5–15.5)
WBC: 6.7 10*3/uL (ref 4.0–10.5)
nRBC: 0 % (ref 0.0–0.2)

## 2023-10-14 LAB — COMPREHENSIVE METABOLIC PANEL
ALT: 13 U/L (ref 0–44)
AST: 19 U/L (ref 15–41)
Albumin: 3.4 g/dL — ABNORMAL LOW (ref 3.5–5.0)
Alkaline Phosphatase: 39 U/L (ref 38–126)
Anion gap: 12 (ref 5–15)
BUN: 8 mg/dL (ref 8–23)
CO2: 25 mmol/L (ref 22–32)
Calcium: 8.8 mg/dL — ABNORMAL LOW (ref 8.9–10.3)
Chloride: 104 mmol/L (ref 98–111)
Creatinine, Ser: 1.45 mg/dL — ABNORMAL HIGH (ref 0.44–1.00)
GFR, Estimated: 36 mL/min — ABNORMAL LOW (ref >60)
Glucose, Bld: 116 mg/dL — ABNORMAL HIGH (ref 70–99)
Potassium: 2.8 mmol/L — ABNORMAL LOW (ref 3.5–5.1)
Sodium: 141 mmol/L (ref 135–145)
Total Bilirubin: 0.5 mg/dL (ref <1.2)
Total Protein: 6.1 g/dL — ABNORMAL LOW (ref 6.5–8.1)

## 2023-10-14 LAB — TROPONIN I (HIGH SENSITIVITY)
Troponin I (High Sensitivity): 5 ng/L (ref <18)
Troponin I (High Sensitivity): 4 ng/L (ref <18)

## 2023-10-14 MED ORDER — POTASSIUM CHLORIDE CRYS ER 20 MEQ PO TBCR: 20.0000 meq | EXTENDED_RELEASE_TABLET | Freq: Once | ORAL | 0 refills | Status: DC

## 2023-10-14 MED ORDER — POTASSIUM CHLORIDE 10 MEQ/100ML IV SOLN
10.0000 meq | INTRAVENOUS | Status: AC
Start: 2023-10-14 — End: 2023-10-14
  Administered 2023-10-14 (×3): 10 meq via INTRAVENOUS
  Filled 2023-10-14 (×3): qty 100

## 2023-10-14 MED ORDER — SODIUM CHLORIDE 0.9 % IV BOLUS
1000.0000 mL | Freq: Once | INTRAVENOUS | Status: AC
  Administered 2023-10-14: 1000 mL via INTRAVENOUS

## 2023-10-14 MED ORDER — POTASSIUM CHLORIDE CRYS ER 20 MEQ PO TBCR
40.0000 meq | EXTENDED_RELEASE_TABLET | Freq: Once | ORAL | Status: AC
  Administered 2023-10-14: 40 meq via ORAL
  Filled 2023-10-14: qty 2

## 2023-10-14 NOTE — ED Provider Notes (Signed)
Accepted handoff at shift change from [facility]. Please see prior provider note for full HPI.  Briefly: Patient is a 82 y.o. female who presents to the ER for generalized weakness and diarrhea. Orthostatic with EMS.   DDX/Plan: Patient found to have potassium 2.8. Patient is insistent she does not want to be admitted and wants to stay to have potassium replaced and then be discharged. Patient has received 40 meq PO potassium and has finished 1 round of IV potassium. Plan to receive 2 more and then be discharged.   2340 -- Patient finished IV potassium. Feeling well and ready to go home. PO potassium already sent to pharmacy. Patient knows to follow up with PCP next week for lab recheck.    Kelly Kennedy 10/14/23 2341    Laurence Spates, MD 10/15/23 [phone]

## 2023-10-14 NOTE — ED Triage Notes (Signed)
Pt bib ems from home c.o generalized weakness today. Pt had multiple episodes of diarrhea last night, took some immodium and has not had any diarrhea today. Pt was orthostatic with ems.   Lying 148/108 Sitting 124/80     Standing 106/80 Pt arrives a/o

## 2023-10-14 NOTE — ED Provider Notes (Signed)
[place] EMERGENCY DEPARTMENT AT [facility] Provider Note   CSN: [id number] Arrival date & time: 10/14/23  1757     History  Chief Complaint  Patient presents with   Weakness    Kelly Kennedy is a 82 y.o. female.  Patient complains of having diarrhea today.  Patient's daughter reports she went to patient's house to check on her.  She reports patient was in the bathroom.  She reports patient seems weaker than usual.  She reports patient was sitting and not answering her when she would talk.  She reports patient seems normal now.  Patient reports she did not sleep well last night because she was having diarrhea.  Patient's daughter who helps the patient with history reports the patient has frequent episodes of diarrhea.  She reports that patient has had a prolonged history of this.  Patient denies any discomfort at this time she denies any chest pain she denies any abdominal pain.  She has not had any vomiting.  The history is provided by the patient. No language interpreter was used.  Weakness      Home Medications Prior to Admission medications   Medication Sig Start Date End Date Taking? Authorizing Provider  amitriptyline (ELAVIL) 50 MG tablet Take 1-2 tablets (50-100 mg total) by mouth at bedtime as needed (dispense w/o red dye). for sleep 07/03/23   Joaquim Nam, MD  carvedilol (COREG) 12.5 MG tablet TAKE 1 TABLET BY MOUTH 2 TIMES DAILY. 07/17/23   Joaquim Nam, MD  cholestyramine Lanetta Inch) 4 g packet TAKE 1 PACKET (4 G TOTAL) BY MOUTH 3 (THREE) TIMES DAILY WITH MEALS. 10/24/22   Joaquim Nam, MD  cyanocobalamin (,VITAMIN B-12,) 1000 MCG/ML injection Inject 1 mL (1,000 mcg total) into the muscle every 30 (thirty) days. 07/07/21   Joaquim Nam, MD  diclofenac Sodium (VOLTAREN) 1 % GEL APPLY 2 GRAMS TOPICALLY 4 TIMES A DAY AS NEEDED 06/20/23   Joaquim Nam, MD  Evolocumab (REPATHA SURECLICK) 140 MG/ML SOAJ ADMINISTER 140 MG UNDER THE SKIN EVERY  14 DAYS 08/10/23   Croitoru, Mihai, MD  fluocinonide cream (LIDEX) 0.05 % Apply topically daily as needed.  Use sparingly. 08/09/23   Joaquim Nam, MD  isosorbide mononitrate (IMDUR) 30 MG 24 hr tablet TAKE 1 TABLET BY MOUTH EVERY DAY 10/03/23   Croitoru, Mihai, MD  levothyroxine (SYNTHROID) 75 MCG tablet TAKE 1 TABLET BY MOUTH EVERY DAY BEFORE BREAKFAST 10/04/23   Joaquim Nam, MD  Lifitegrast Benay Spice) 5 % SOLN Apply 1 drop to eye in the morning and at bedtime.    [provider]  LUMIGAN 0.01 % SOLN Place 1 drop into both eyes at bedtime. 06/29/19   [provider]  memantine (NAMENDA) 5 MG tablet Take 1 tablet (5 mg total) by mouth 2 (two) times daily. 09/03/23   Joaquim Nam, MD  mometasone (NASONEX) 50 MCG/ACT nasal spray Place 2 sprays into the nose daily. 03/01/23   Joaquim Nam, MD  nitrofurantoin (MACRODANTIN) 100 MG capsule TAKE 1 CAPSULE BY MOUTH EVERY DAY 08/11/23   Joaquim Nam, MD  nitroGLYCERIN (NITROSTAT) 0.4 MG SL tablet Place 1 tablet (0.4 mg total) under the tongue every 5 (five) minutes as needed for chest pain. 10/12/23   Ronney Asters, NP  omeprazole (PRILOSEC) 40 MG capsule TAKE 1 CAPSULE (40 MG TOTAL) BY MOUTH DAILY. 07/26/23   Doree Albee, PA-C  solifenacin (VESICARE) 5 MG tablet TAKE 1 TABLET (5  MG TOTAL) BY MOUTH DAILY. 08/28/23   Joaquim Nam, MD      Allergies    Ace inhibitors, Angiotensin receptor blockers, Statins, Donepezil, Flonase [fluticasone propionate], Glipizide, Iron, Sulfa antibiotics, Niacin, and Red dye #40 (allura red)    Review of Systems   Review of Systems  Neurological:  Positive for weakness.  All other systems reviewed and are negative.   Physical Exam Updated Vital Signs BP (!) 140/77   Pulse 67   Temp 98.2 F (36.8 C) (Oral)   Resp 15   Ht 5\' 5"  (1.651 m)   Wt 72.6 kg   SpO2 90%   BMI 26.63 kg/m  Physical Exam Vitals and nursing note reviewed.  Constitutional:      Appearance: She is  well-developed.  HENT:     Head: Normocephalic.     Right Ear: External ear normal.     Left Ear: External ear normal.     Nose: Nose normal.     Mouth/Throat:     Mouth: Mucous membranes are moist.  Cardiovascular:     Rate and Rhythm: Normal rate.  Pulmonary:     Effort: Pulmonary effort is normal.  Abdominal:     General: There is no distension.  Musculoskeletal:        General: Normal range of motion.     Cervical back: Normal range of motion.  Skin:    General: Skin is warm.  Neurological:     General: No focal deficit present.     Mental Status: She is alert and oriented to person, place, and time.     ED Results / Procedures / Treatments   Labs (all labs ordered are listed, but only abnormal results are displayed) Labs Reviewed  CBC WITH DIFFERENTIAL/PLATELET - Abnormal; Notable for the following components:      Result Value   RDW 15.6 (*)    All other components within normal limits  COMPREHENSIVE METABOLIC PANEL - Abnormal; Notable for the following components:   Potassium 2.8 (*)    Glucose, Bld 116 (*)    Creatinine, Ser 1.45 (*)    Calcium 8.8 (*)    Total Protein 6.1 (*)    Albumin 3.4 (*)    GFR, Estimated 36 (*)    All other components within normal limits  URINALYSIS, ROUTINE W REFLEX MICROSCOPIC  TROPONIN I (HIGH SENSITIVITY)  TROPONIN I (HIGH SENSITIVITY)    EKG None  Radiology No results found.  Procedures Procedures    Medications Ordered in ED Medications  sodium chloride 0.9 % bolus 1,000 mL (has no administration in time range)  potassium chloride SA (KLOR-CON M) CR tablet 40 mEq (has no administration in time range)  potassium chloride 10 mEq in 100 mL IVPB (has no administration in time range)    ED Course/ Medical Decision Making/ A&P                                 Medical Decision Making Patient has had diarrhea for the past 2 days.  Patient's daughter reports patient seemed weaker today.  Amount and/or Complexity of  Data Reviewed Independent Historian: EMS    Details: EMS brought patient to the emergency department after her daughter called because patient was having diarrhea and seemed weaker than usual External Data Reviewed: notes. Labs: ordered. Decision-making details documented in ED Course.    Details: Labs ordered reviewed and interpreted patient has a  potassium of 2.8.  Risk Prescription drug management. Risk Details: Patient is insistent that she does not want to be admitted to the hospital.  Patient is given oral potassium.  Patient is given 1 L of IV fluids.  Patient is given potassium IV.  Patient is advised that she needs to return to the hospital if diarrhea persist.  Patient has not had any diarrhea since arriving to the emergency department.           Final Clinical Impression(s) / ED Diagnoses Final diagnoses:  Diarrhea, unspecified type  Hypokalemia    Rx / DC Orders ED Discharge Orders          Ordered    potassium chloride SA (KLOR-CON M) 20 MEQ tablet   Once        10/14/23 2039           An After Visit Summary was printed and given to the patient.    Osie Cheeks 10/14/23 2043    Bethann Berkshire, MD 10/17/23 [phone]

## 2023-10-14 NOTE — Discharge Instructions (Addendum)
Take potassium as directed.

## 2023-10-30 ENCOUNTER — Telehealth: Payer: Self-pay

## 2023-10-30 ENCOUNTER — Other Ambulatory Visit: Payer: Self-pay | Admitting: Family Medicine

## 2023-10-30 ENCOUNTER — Ambulatory Visit: Payer: Self-pay

## 2023-10-30 DIAGNOSIS — E876 Hypokalemia: Secondary | ICD-10-CM

## 2023-10-30 NOTE — Patient Outreach (Signed)
  Care Coordination   Follow Up Visit Note   10/30/2023 Name: Kelly Kennedy MRN: [id number] DOB: 06-Jan-1941  Kelly Kennedy is a 82 y.o. year old female who sees Joaquim Nam, MD for primary care. I spoke with  Kelly Kennedy  and daughter, Kelly Kennedy by phone today.  What matters to the patients health and wellness today?  Per chart review patient seen in ED on 10/14/23 due to diarrhea. Patient found to be hypokalemia while in ED.  Request noted to have repeat labs with primary provider.  Patient states she has not had a follow up visit or labs with her primary care provider since being in the ED. Patient request RNCM discusse need for repeat labs with her daughter Kelly Kennedy since she will be the one providing her transportation.  Patient denies ongoing diarrhea.  Patient denies any increase in SOB/CP.  She denies having to use  nitroglycerine since ED visit on 10/14/23.   Patient states she has not checked her BP because she needs batteries for the monitor.  Telephone call to [facility] practitioner.  Lab appointment arranged for patient for 12/02/22 at 4:15 pm.  Patients daughter, Kelly Kennedy called and notified of appointment.    Goals Addressed             This Visit's Progress    Develop plan of care for management of coronary artery disease/ memory concerns       Interventions Today    Flowsheet Row Most Recent Value  Chronic Disease   Chronic disease during today's visit Other, Hypertension (HTN)  [CAD, memory changes, hypokalemia]  General Interventions   General Interventions Discussed/Reviewed General Interventions Reviewed, Doctor Visits, Labs  [evaluation of current treatment plan for HTN, CAD, memory issues, hypokalemia and patients adherence to plan as advised by provider.  Assessed for CAD symptoms, BP readings, ongoing/ worsening memory.]  Labs --  [Discussed with patient need for repeat potassium level lab as requested by ED  provider.]  Doctor Visits Discussed/Reviewed Doctor Visits Reviewed  [scheduled follow up lab appointment with primary provider as requested by ED provider. Daughter notified of lab appointment for 11/02/23 at 4:15 pm.  Message sent to primary provider regarding ED visit on11/16/24 and request by ED provider for repeat lab.]  Education Interventions   Education Provided Provided Education  [Advised to notify provider for worsening chest pain/ SOB.  Advised to call 911 for severe symptoms. Patient advised to get battery for BP montor and continue checking BP and recording at least 3 x per week.]  Provided Verbal Education On Other  [Assessed for worsening memory symptoms.]  Pharmacy Interventions   Pharmacy Dicussed/Reviewed Pharmacy Topics Reviewed  [medications reviewed. Advised to take medications as prescribed. Advised to use an alarm on clock/ phone/ pill box to help with remembering medications.]               SDOH assessments and interventions completed:  Yes     Care Coordination Interventions:  Yes, provided   Follow up plan: Follow up call scheduled for 11/29/22    Encounter Outcome:  Patient Visit Completed   George Ina RN,BSN,CCM [facility]  [facility], [facility] coordinator / Case Manager Phone: [phone]

## 2023-10-30 NOTE — Telephone Encounter (Signed)
Received order for testing supplies. Placed in your box for review.

## 2023-10-30 NOTE — Patient Instructions (Signed)
Visit Information  Thank you for taking time to visit with me today. Please don't hesitate to contact me if I can be of assistance to you.   Following are the goals we discussed today:   Goals Addressed             This Visit's Progress    Develop plan of care for management of coronary artery disease/ memory concerns       Interventions Today    Flowsheet Row Most Recent Value  Chronic Disease   Chronic disease during today's visit Other, Hypertension (HTN)  [CAD, memory changes, hypokalemia]  General Interventions   General Interventions Discussed/Reviewed General Interventions Reviewed, Doctor Visits, Labs  Ramsay of current treatment plan for HTN, CAD, memory issues, hypokalemia and patients adherence to plan as advised by provider.  Assessed for CAD symptoms, BP readings, ongoing/ worsening memory.]  Labs --  [Discussed with patient need for repeat potassium level lab as requested by ED provider.]  Doctor Visits Discussed/Reviewed Doctor Visits Reviewed  [scheduled follow up lab appointment with primary provider as requested by ED provider. Daughter notified of lab appointment for 11/02/23 at 4:15 pm.  Message sent to primary provider regarding ED visit on11/16/24 and request by ED provider for repeat lab.]  Education Interventions   Education Provided Provided Education  [Advised to notify provider for worsening chest pain/ SOB.  Advised to call 911 for severe symptoms. Patient advised to get battery for BP montor and continue checking BP and recording at least 3 x per week.]  Provided Verbal Education On Other  [Assessed for worsening memory symptoms.]  Pharmacy Interventions   Pharmacy Dicussed/Reviewed Pharmacy Topics Reviewed  [medications reviewed. Advised to take medications as prescribed. Advised to use an alarm on clock/ phone/ pill box to help with remembering medications.]               Our next appointment is by telephone on 11/30/23 at 2 pm  Please call the  care guide team at [phone] if you need to cancel or reschedule your appointment.   If you are experiencing a Mental Health or Behavioral Health Crisis or need someone to talk to, please call the Suicide and Crisis Lifeline: 988 call 1-800-273-TALK (toll free, 24 hour hotline)  Patient verbalizes understanding of instructions and care plan provided today and agrees to view in MyChart. Active MyChart status and patient understanding of how to access instructions and care plan via MyChart confirmed with patient.     George Ina RN,BSN,CCM [place]  [facility], [facility] coordinator / Case Manager Phone: [phone]

## 2023-10-31 NOTE — Telephone Encounter (Signed)
I'll work on the hard copy.  Thanks.  

## 2023-11-02 ENCOUNTER — Telehealth: Payer: Self-pay

## 2023-11-02 ENCOUNTER — Other Ambulatory Visit (INDEPENDENT_AMBULATORY_CARE_PROVIDER_SITE_OTHER): Payer: 59

## 2023-11-02 DIAGNOSIS — I214 Non-ST elevation (NSTEMI) myocardial infarction: Secondary | ICD-10-CM

## 2023-11-02 DIAGNOSIS — I493 Ventricular premature depolarization: Secondary | ICD-10-CM

## 2023-11-02 DIAGNOSIS — I25118 Atherosclerotic heart disease of native coronary artery with other forms of angina pectoris: Secondary | ICD-10-CM

## 2023-11-02 DIAGNOSIS — E876 Hypokalemia: Secondary | ICD-10-CM | POA: Diagnosis not present

## 2023-11-02 NOTE — Addendum Note (Signed)
Addended by: Lovena Neighbours on: 11/02/2023 03:57 PM   Modules accepted: Orders

## 2023-11-02 NOTE — Telephone Encounter (Signed)
Transition Care Management Follow-up Telephone Call Date of discharge and from where: 10/14/2023 [facility] How have you been since you were released from the hospital? Patient is feeling better after episode of diarrhea and her potassium level is being monitored by PCP. Any questions or concerns? No  Items Reviewed: Did the pt receive and understand the discharge instructions provided? Yes  Medications obtained and verified?  No medication prescribed this visit. Other? No  Any new allergies since your discharge? No  Dietary orders reviewed? Yes Do you have support at home? Yes   Follow up appointments reviewed:  PCP Hospital f/u appt confirmed? Yes  Scheduled to see Dwana Curd. Para March, MD on 11/02/2023 @  [place]. Specialist Hospital f/u appt confirmed? No  Scheduled to see  on  @ . Are transportation arrangements needed? No  If their condition worsens, is the pt aware to call PCP or go to the Emergency Dept.? Yes Was the patient provided with contact information for the PCP's office or ED? Yes Was to pt encouraged to call back with questions or concerns? Yes   Vihaan Gloss [facility]  [facility], [facility] Guide Direct Dial: [phone]  Website: [email or web address]

## 2023-11-03 LAB — BASIC METABOLIC PANEL
BUN: 14 mg/dL (ref 6–23)
CO2: 30 meq/L (ref 19–32)
Calcium: 9.2 mg/dL (ref 8.4–10.5)
Chloride: 101 meq/L (ref 96–112)
Creatinine, Ser: 1.18 mg/dL (ref 0.40–1.20)
GFR: 43.25 mL/min — ABNORMAL LOW (ref >60.00)
Glucose, Bld: 102 mg/dL — ABNORMAL HIGH (ref 70–99)
Potassium: 4.4 meq/L (ref 3.5–5.1)
Sodium: 139 meq/L (ref 135–145)

## 2023-11-09 ENCOUNTER — Ambulatory Visit: Payer: 59 | Admitting: Family Medicine

## 2023-11-09 VITALS — BP 124/70 | HR 84 | Temp 98.1°F | Ht 65.0 in | Wt 166.4 lb

## 2023-11-09 DIAGNOSIS — K529 Noninfective gastroenteritis and colitis, unspecified: Secondary | ICD-10-CM | POA: Diagnosis not present

## 2023-11-09 DIAGNOSIS — M1711 Unilateral primary osteoarthritis, right knee: Secondary | ICD-10-CM

## 2023-11-09 DIAGNOSIS — M11261 Other chondrocalcinosis, right knee: Secondary | ICD-10-CM

## 2023-11-09 MED ORDER — TRIAMCINOLONE ACETONIDE 40 MG/ML IJ SUSP
40.0000 mg | Freq: Once | INTRAMUSCULAR | Status: AC
  Administered 2023-11-09: 40 mg via INTRA_ARTICULAR

## 2023-11-09 NOTE — Progress Notes (Signed)
Kelly Ripley T. Oniel Meleski, MD, CAQ Sports Medicine [facility] at [facility] [address] [place], [address]  Phone: [phone]  FAX: [phone]  Kelly Kennedy - 82 y.o. female  MRN [id number]  Date of Birth: 1940/12/05  Date: 11/09/2023  PCP: Joaquim Nam, MD  Referral: Joaquim Nam, MD  Chief Complaint  Patient presents with   Fall    3 weeks ago   Knee Pain    Right   Subjective:   Kelly Kennedy is a 83 y.o. very pleasant female patient with Body mass index is 27.69 kg/m. who presents with the following:  Fall 3 weeks ago with continued knee pain.  She is a describes a fall about 3 weeks ago when she was stepping on some planks in her backyard and got one of her feet caught, and she subsequently fell on the left side of her body.  She did not directly strike the knee or twist the knee as far she can recall.  According the patient and her daughter, her knee pain started about 2 or 3 days after the initial fall.  She has been having a harder time walking around, however she does ambulate at home with a walker at baseline.  She is in a wheelchair today in the office.  Independent interpretation of complete knee series from March 2024.  She does have severe patellofemoral osteoarthritis.  The remainder of the right knee joint appears to have more moderate osteoarthritis, however imaging is limited in a nonweightbearing status.  She also has fairly extensive chondrocalcinosis.  She also has vascular calcification.  By history, the patient and her daughter do not recollect any history of red hot swollen joints, and they deny history of CPPD or gouty symptoms.  Chondrocalcinosis-osteoarthritis versus CPPD.  The patient's primary care doctor asked me to inquire about her diarrhea.  She was seen in the emergency room on October 14, 2023, and she did not have a follow-up office visit regarding this.  She was weak and orthostatic.  She did  have a potassium of 2.8 at that time and received p.o. and IV potassium.  She intermittently will take her Questran, and she does think that this helps when she takes it. -Repeat potassium on November 02, 2023 was 4.4.  Namenda makes mind go blank? Intermittently taking Questran  Review of Systems is noted in the HPI, as appropriate  Objective:   BP 124/70 (BP Location: Left Arm, Patient Position: Sitting, Cuff Size: Large)   Pulse 84   Temp 98.1 F (36.7 C) (Temporal)   Ht 5\' 5"  (1.651 m)   Wt 166 lb 6 oz (75.5 kg)   SpO2 98%   BMI 27.69 kg/m   GEN: No acute distress; alert,appropriate. PULM: Breathing comfortably in no respiratory distress PSYCH: Normally interactive.   Right knee: She lacks 6 degrees of extension.  Flexion to 95 degrees. No significant effusion Minimal motion at the patella and she does have pain with loading the medial lateral patellar facets She has significant medial and lateral joint line tenderness No significant pain at the tibial tubercle, tibial plateau.  Nontender at the pes anserine bursa, quad tendon, patellar tendon. Stable to varus and valgus stress ACL and PCL are intact Forced flexion causes pain  Laboratory and Imaging Data:  Assessment and Plan:     ICD-10-CM   1. Primary osteoarthritis of right knee  M17.11 triamcinolone acetonide (KENALOG-40) injection 40 mg    2. Chondrocalcinosis  of right knee  M11.261 triamcinolone acetonide (KENALOG-40) injection 40 mg    3. Chronic diarrhea  K52.9      Acute on chronic right-sided knee osteoarthritis with exacerbation.  Stable knee exam.  No direct trauma on her fall which caused pain several days later only.  Not consistent with fracture.  She does have chronic kidney disease stage III, so we will avoid NSAIDs.  Topical Voltaren is fine.  We will also do an intra-articular injection of to try to calm down her right knee.  While she has fairly extensive chondrocalcinosis, she does not have a  history consistent with CPPD.  In regards to the diarrhea, the patient is a fairly poor historian.  Her daughter provides some additional information, however the patient still lives alone.  She does think when she takes her Questran, diarrhea symptoms improved.  I encouraged her to take Questran every morning, a full dose, and I suspect that this will likely improve her symptoms.  I will alert Dr. Para March.  Aspiration/Injection Procedure Note Kelly Kennedy 02/23/1941 Date of procedure: 11/09/2023  Procedure: Large Joint Aspiration / Injection of Knee, R Indications: Pain  Procedure Details Patient verbally consented to procedure. Risks, benefits, and alternatives explained. Sterilely prepped with Chloraprep. Ethyl cholride used for anesthesia. 9 cc Lidocaine 1% mixed with 1 mL of Kenalog 40 mg injected using the anteromedial approach without difficulty. No complications with procedure and tolerated well. Patient had decreased pain post-injection. Medication: 1 mL of Kenalog 40 mg   Medication Management during today's office visit: Meds ordered this encounter  Medications   triamcinolone acetonide (KENALOG-40) injection 40 mg   There are no discontinued medications.  Orders placed today for conditions managed today: No orders of the defined types were placed in this encounter.   Disposition: No follow-ups on file.  [employer] One speech-to-text software was used for transcription in this dictation.  Possible transcriptional errors can occur using Animal nutritionist.   Signed,  Kelly Galea. Luby Seamans, MD   Outpatient Encounter Medications as of 11/09/2023  Medication Sig   amitriptyline (ELAVIL) 50 MG tablet Take 1-2 tablets (50-100 mg total) by mouth at bedtime as needed (dispense w/o red dye). for sleep   carvedilol (COREG) 12.5 MG tablet TAKE 1 TABLET BY MOUTH 2 TIMES DAILY.   cholestyramine (QUESTRAN) 4 g packet TAKE 1 PACKET (4 G TOTAL) BY MOUTH 3 (THREE) TIMES DAILY  WITH MEALS.   cyanocobalamin (,VITAMIN B-12,) 1000 MCG/ML injection Inject 1 mL (1,000 mcg total) into the muscle every 30 (thirty) days.   diclofenac Sodium (VOLTAREN) 1 % GEL APPLY 2 GRAMS TOPICALLY 4 TIMES A DAY AS NEEDED   Evolocumab (REPATHA SURECLICK) 140 MG/ML SOAJ ADMINISTER 140 MG UNDER THE SKIN EVERY 14 DAYS   fluocinonide cream (LIDEX) 0.05 % Apply topically daily as needed.  Use sparingly.   isosorbide mononitrate (IMDUR) 30 MG 24 hr tablet TAKE 1 TABLET BY MOUTH EVERY DAY   levothyroxine (SYNTHROID) 75 MCG tablet TAKE 1 TABLET BY MOUTH EVERY DAY BEFORE BREAKFAST   Lifitegrast (XIIDRA) 5 % SOLN Apply 1 drop to eye in the morning and at bedtime.   LUMIGAN 0.01 % SOLN Place 1 drop into both eyes at bedtime.   memantine (NAMENDA) 5 MG tablet Take 1 tablet (5 mg total) by mouth 2 (two) times daily.   mometasone (NASONEX) 50 MCG/ACT nasal spray Place 2 sprays into the nose daily.   nitrofurantoin (MACRODANTIN) 100 MG capsule TAKE 1 CAPSULE BY MOUTH EVERY DAY  nitroGLYCERIN (NITROSTAT) 0.4 MG SL tablet Place 1 tablet (0.4 mg total) under the tongue every 5 (five) minutes as needed for chest pain.   omeprazole (PRILOSEC) 40 MG capsule TAKE 1 CAPSULE (40 MG TOTAL) BY MOUTH DAILY.   solifenacin (VESICARE) 5 MG tablet TAKE 1 TABLET (5 MG TOTAL) BY MOUTH DAILY.   potassium chloride SA (KLOR-CON M) 20 MEQ tablet Take 1 tablet (20 mEq total) by mouth once for 1 dose.   [EXPIRED] triamcinolone acetonide (KENALOG-40) injection 40 mg    No facility-administered encounter medications on file as of 11/09/2023.

## 2023-11-10 ENCOUNTER — Encounter: Payer: Self-pay | Admitting: Family Medicine

## 2023-11-13 DIAGNOSIS — H34831 Tributary (branch) retinal vein occlusion, right eye, with macular edema: Secondary | ICD-10-CM | POA: Diagnosis not present

## 2023-11-26 ENCOUNTER — Other Ambulatory Visit: Payer: Self-pay | Admitting: Family Medicine

## 2023-11-26 ENCOUNTER — Other Ambulatory Visit: Payer: Self-pay | Admitting: Physician Assistant

## 2023-11-26 DIAGNOSIS — K219 Gastro-esophageal reflux disease without esophagitis: Secondary | ICD-10-CM

## 2023-11-30 ENCOUNTER — Ambulatory Visit: Payer: Self-pay

## 2023-11-30 ENCOUNTER — Telehealth: Payer: Self-pay

## 2023-11-30 NOTE — Patient Instructions (Signed)
 Visit Information  Thank you for taking time to visit with me today. Please don't hesitate to contact me if I can be of assistance to you.   Following are the goals we discussed today:   Goals Addressed             This Visit's Progress    Develop plan of care for management of coronary artery disease/ memory concerns       Interventions Today    Flowsheet Row Most Recent Value  Chronic Disease   Chronic disease during today's visit Hypertension (HTN), Other  [CAD, diarrhea, right knee pain, falls.]  General Interventions   General Interventions Discussed/Reviewed General Interventions Reviewed, Doctor Visits  [evaluation of current treatment plan for mentioned health conditions and patients adherence to plan as established by provider. Assessed for BP readings, knee pain post injection, ongoing diarrhea]  Doctor Visits Discussed/Reviewed Doctor Visits Reviewed  Exercise Interventions   Exercise Discussed/Reviewed Physical Activity  [assessed patients current activity level.]  Education Interventions   Education Provided Provided Education  [Advised to monitor blood pressure at least 2-3 times per week and record. Advised to notify provider if readings outside of established parameter. Education article sent to patient for knee pain. Knee pain care reviewed.]  Provided Verbal Education On Other  [Advised to call 911 for signs of heart attack: severe chest, back, jaw pain, nausea, vomiting.]  Pharmacy Interventions   Pharmacy Dicussed/Reviewed Pharmacy Topics Reviewed  [medications reviewed and compliance discussed. Discussed medication changes and advised to take Questran  daily as prescribed by provider. Discussed strategies to encourage patient to take medications such as call patient during the day as a reminder.]  Safety Interventions   Safety Discussed/Reviewed Fall Risk  [Assessed for falls. Fall prevention education article sent to patient for review.]             Please  review education articles in Mychart on Chronic knee pain and fall prevention.  Our next appointment is by telephone on 01/03/24 at 2 pm  Please call the care guide team at [phone] if you need to cancel or reschedule your appointment.   If you are experiencing a Mental Health or Behavioral Health Crisis or need someone to talk to, please call the Suicide and Crisis Lifeline: 988 call 1-800-273-TALK (toll free, 24 hour hotline)  Patient verbalizes understanding of instructions and care plan provided today and agrees to view in MyChart. Active MyChart status and patient understanding of how to access instructions and care plan via MyChart confirmed with patient.     Arvin Seip RN,BSN,CCM [place]  [facility], [facility] coordinator / Case Manager Phone: [phone]

## 2023-11-30 NOTE — Patient Outreach (Signed)
  Care Coordination   Follow Up Visit Note   11/30/2023 Name: Kelly Kennedy MRN: [id number] DOB: 03/20/1941  Kelly Kennedy is a 83 y.o. year old female who sees [name] Arlyss RAMAN, MD for primary care. I  spoke with Darlene Henry as requested by patient.   What matters to the patients health and wellness today?  Daughter states patient is doing ok.  She reports patient had follow up visit with primary provider on 11/09/23. She states patients ongoing knee pain and recent fall was discussed with provider as well as diarrhea.   Daughter states patient had injection for knee pain and was told to take Questran  daily. Daughter states patient's knee pain seems to be somewhat better and swelling has decreased.  Daughter denies patient having any falls since appointment with provider on 11/09/23.  Daughter states patient still has occasional diarrhea. Daughter states patient doesn't always take her medication correctly and feels it may be due to this.  Daughter states patient will on occasion take all of her medication at one time.  She states patient chooses to do this at times even though she has been encouraged not to. Daughter states she is at work during the day and is therefore not there to oversee patients medication administration all of the time. Daughter states she does prepare patients pill box.     Goals Addressed             This Visit's Progress    Develop plan of care for management of coronary artery disease/ memory concerns       Interventions Today    Flowsheet Row Most Recent Value  Chronic Disease   Chronic disease during today's visit Hypertension (HTN), Other  [CAD, diarrhea, right knee pain, falls.]  General Interventions   General Interventions Discussed/Reviewed General Interventions Reviewed, Doctor Visits  [evaluation of current treatment plan for mentioned health conditions and patients adherence to plan as established by provider. Assessed for BP readings, knee  pain post injection, ongoing diarrhea]  Doctor Visits Discussed/Reviewed Doctor Visits Reviewed  Exercise Interventions   Exercise Discussed/Reviewed Physical Activity  [assessed patients current activity level.]  Education Interventions   Education Provided Provided Education  [Advised to monitor blood pressure at least 2-3 times per week and record. Advised to notify provider if readings outside of established parameter. Education article sent to patient for knee pain. Knee pain care reviewed.]  Provided Verbal Education On Other  [Advised to call 911 for signs of heart attack: severe chest, back, jaw pain, nausea, vomiting.]  Pharmacy Interventions   Pharmacy Dicussed/Reviewed Pharmacy Topics Reviewed  [medications reviewed and compliance discussed. Discussed medication changes and advised to take Questran  daily as prescribed by provider. Discussed strategies to encourage patient to take medications such as call patient during the day as a reminder.]  Safety Interventions   Safety Discussed/Reviewed Fall Risk  [Assessed for falls. Fall prevention education article sent to patient for review.]              SDOH assessments and interventions completed:  No     Care Coordination Interventions:  Yes, provided   Follow up plan: Follow up call scheduled for 01/02/23 at 2 pm    Encounter Outcome:  Patient Visit Completed   Cayne Yom RN,BSN,CCM [facility]  [facility], [facility] coordinator / Case Manager Phone: [phone]

## 2023-11-30 NOTE — Patient Outreach (Signed)
  Care Coordination   11/30/2023 Name: Kelly Kennedy MRN: [id number] DOB: 07-Feb-1941   Care Coordination Outreach Attempts:  Successful contact made with patient.  Patient states she would rather me speak with her daugther, [name] regarding her health information.  Call attempted to patients daughter as requested.  Unable to reach.  HIPAA compliant message left with daughter and request for return call.   Follow Up Plan:  Additional outreach attempts will be made to offer the patient complex care management information and services.   Encounter Outcome:  Patient Request to Call Back   Care Coordination Interventions:  No, not indicated    Keniesha Adderly RN,BSN,CCM [facility]  [facility], [facility] coordinator / Case Manager Phone: [phone]

## 2023-12-04 ENCOUNTER — Encounter: Payer: Self-pay | Admitting: Family Medicine

## 2023-12-04 ENCOUNTER — Ambulatory Visit: Payer: 59 | Admitting: Family Medicine

## 2023-12-04 VITALS — BP 128/72 | HR 85 | Temp 98.9°F | Ht 65.0 in | Wt 168.4 lb

## 2023-12-04 DIAGNOSIS — Z87440 Personal history of urinary (tract) infections: Secondary | ICD-10-CM | POA: Diagnosis not present

## 2023-12-04 DIAGNOSIS — R413 Other amnesia: Secondary | ICD-10-CM | POA: Diagnosis not present

## 2023-12-04 DIAGNOSIS — E538 Deficiency of other specified B group vitamins: Secondary | ICD-10-CM | POA: Diagnosis not present

## 2023-12-04 DIAGNOSIS — E119 Type 2 diabetes mellitus without complications: Secondary | ICD-10-CM | POA: Diagnosis not present

## 2023-12-04 LAB — POCT GLYCOSYLATED HEMOGLOBIN (HGB A1C): Hemoglobin A1C: 5.7 % — AB (ref 4.0–5.6)

## 2023-12-04 MED ORDER — NITROFURANTOIN MACROCRYSTAL 100 MG PO CAPS: 100.0000 mg | ORAL_CAPSULE | Freq: Every day | ORAL | 0 refills | Status: DC

## 2023-12-04 MED ORDER — MEMANTINE HCL 5 MG PO TABS: ORAL_TABLET | ORAL | Status: DC

## 2023-12-04 MED ORDER — CYANOCOBALAMIN 1000 MCG/ML IJ SOLN
1000.0000 ug | Freq: Once | INTRAMUSCULAR | Status: AC
  Administered 2023-12-04: 1000 ug via INTRAMUSCULAR

## 2023-12-04 MED ORDER — CHOLESTYRAMINE 4 G PO PACK: 4.0000 g | PACK | Freq: Three times a day (TID) | ORAL | Status: DC | PRN

## 2023-12-04 MED ORDER — CARVEDILOL 12.5 MG PO TABS: 12.5000 mg | ORAL_TABLET | Freq: Two times a day (BID) | ORAL | 3 refills | Status: DC

## 2023-12-04 NOTE — Patient Instructions (Addendum)
 Please call the eye clinic about your eyedrop prescriptions.   Check your med list at home.  If still taking namenda  5mg  twice a day, then increase to 10mg  in the AM and 5mg  in the PM.  Take the higher dose for 1 week then increase again to 10mg  twice a day.   Let me know if you can't tolerate that or if you are not currently taking namenda .   Take care.  Glad to see you. I'll check with social work.  Repeat B12 shot in 1 month.  Nurse visit.

## 2023-12-04 NOTE — Progress Notes (Signed)
 Memory follow up.  She had been misplacing some items, more forgetful per family report. She is not driving.  Her preference is to stay at home.  Her sister is currently living at home with her.  Discussed getting social work referral to see about any community resources that would be available for the patient. See MMSE below.  A1c done at office visit.  5.7.  Discussed with patient.  Not diabetic.  She should have refill on macrobid  and carvedilol  but family had screenshot of rx denials.  D/w pt.  I don't recall denying the rx.  She is still on both meds.  I sent and printed both meds today in case of transmission failure.    She is off her eye drops in the meantime-discussed with patient and daughter about following up with the eye clinic regarding her prescriptions.  Due for B12 shot.  Done at office visit today.  She had prev knee injection with some transient relief.   Discussed UTI prophylaxis.  No dysuria.  Still on macrobid .    No CP.  Not SOB.  Some BLE edema, improved with compressions stockings.    Meds, vitals, and allergies reviewed.   ROS: Per HPI unless specifically indicated in ROS section   Nad Ncat Neck supple, no LA Rrr Ctab Abd soft, not ttp Extremities well-perfused. MMSE 15/30.  This is clearly different than prior.  -6 for orientation -5 for calculation -3 for recall.  -1 for copying pentagons.  45 minutes were devoted to patient care in this encounter (this includes time spent reviewing the patient's file/history, interviewing and examining the patient, counseling/reviewing plan with patient).

## 2023-12-05 DIAGNOSIS — Z961 Presence of intraocular lens: Secondary | ICD-10-CM | POA: Diagnosis not present

## 2023-12-05 DIAGNOSIS — E119 Type 2 diabetes mellitus without complications: Secondary | ICD-10-CM | POA: Diagnosis not present

## 2023-12-05 DIAGNOSIS — H401131 Primary open-angle glaucoma, bilateral, mild stage: Secondary | ICD-10-CM | POA: Diagnosis not present

## 2023-12-05 DIAGNOSIS — H0288A Meibomian gland dysfunction right eye, upper and lower eyelids: Secondary | ICD-10-CM | POA: Diagnosis not present

## 2023-12-05 DIAGNOSIS — H353131 Nonexudative age-related macular degeneration, bilateral, early dry stage: Secondary | ICD-10-CM | POA: Diagnosis not present

## 2023-12-05 DIAGNOSIS — H34831 Tributary (branch) retinal vein occlusion, right eye, with macular edema: Secondary | ICD-10-CM | POA: Diagnosis not present

## 2023-12-05 DIAGNOSIS — H0288B Meibomian gland dysfunction left eye, upper and lower eyelids: Secondary | ICD-10-CM | POA: Diagnosis not present

## 2023-12-06 DIAGNOSIS — R413 Other amnesia: Secondary | ICD-10-CM | POA: Insufficient documentation

## 2023-12-06 DIAGNOSIS — Z87440 Personal history of urinary (tract) infections: Secondary | ICD-10-CM | POA: Insufficient documentation

## 2023-12-06 NOTE — Assessment & Plan Note (Addendum)
 Presumed dementia. MMSE 15/30.  This is clearly different than prior.  -6 for orientation -5 for calculation -3 for recall.  -1 for copying pentagons.  We should still treat her B12 deficiency in case that is contributing.  Dose given at office visit.  I asked her daughter to review her medications at home. If still taking namenda  5mg  twice a day, then increase to 10mg  in the AM and 5mg  in the PM.  Take the higher dose for 1 week then increase again to 10mg  twice a day.   Patient/daughter can me know if she can't tolerate that or if the patient is not currently taking Namenda . Social work referral placed. Repeat B12 shot in 1 month.  Nurse visit.

## 2023-12-06 NOTE — Assessment & Plan Note (Signed)
 Continue replacement.  Repeat dose in 1 month.

## 2023-12-06 NOTE — Assessment & Plan Note (Signed)
Continue Macrobid

## 2023-12-07 ENCOUNTER — Telehealth: Payer: Self-pay | Admitting: *Deleted

## 2023-12-07 NOTE — Progress Notes (Signed)
 Complex Care Management Note  Care Guide Note 12/07/2023 Name: Kelly Kennedy MRN: [id number] DOB: 1941/08/20  Kelly Kennedy is a 83 y.o. year old female who sees [name] Arlyss RAMAN, MD for primary care. I reached out to Verdie Macario Gavel by phone today to offer complex care management services.  [name] was given information about Complex Care Management services today including:   The Complex Care Management services include support from the care team which includes your Nurse Coordinator, Clinical Social Worker, or Pharmacist.  The Complex Care Management team is here to help remove barriers to the health concerns and goals most important to you. Complex Care Management services are voluntary, and the patient may decline or stop services at any time by request to their care team member.   Complex Care Management Consent Status: Patient agreed to services and verbal consent obtained.   Follow up plan:  Telephone appointment with complex care management team member scheduled for:  12/11/2023  Encounter Outcome:  Patient Scheduled  Thedford Franks, [facility] Care Coordination Care Guide Direct Dial: [phone]

## 2023-12-11 ENCOUNTER — Ambulatory Visit: Payer: Self-pay | Admitting: *Deleted

## 2023-12-11 ENCOUNTER — Encounter (HOSPITAL_COMMUNITY): Payer: Self-pay

## 2023-12-11 ENCOUNTER — Other Ambulatory Visit: Payer: Self-pay

## 2023-12-11 ENCOUNTER — Emergency Department (HOSPITAL_COMMUNITY)
Admission: EM | Admit: 2023-12-11 | Discharge: 2023-12-12 | Discharge status: Against Medical Advice | Payer: 59 | Attending: Emergency Medicine | Admitting: Emergency Medicine

## 2023-12-11 DIAGNOSIS — R531 Weakness: Secondary | ICD-10-CM | POA: Diagnosis not present

## 2023-12-11 DIAGNOSIS — R350 Frequency of micturition: Secondary | ICD-10-CM | POA: Diagnosis not present

## 2023-12-11 DIAGNOSIS — F0390 Unspecified dementia without behavioral disturbance: Secondary | ICD-10-CM | POA: Diagnosis not present

## 2023-12-11 DIAGNOSIS — Z5321 Procedure and treatment not carried out due to patient leaving prior to being seen by health care provider: Secondary | ICD-10-CM | POA: Diagnosis not present

## 2023-12-11 DIAGNOSIS — R0902 Hypoxemia: Secondary | ICD-10-CM | POA: Diagnosis not present

## 2023-12-11 DIAGNOSIS — R6889 Other general symptoms and signs: Secondary | ICD-10-CM | POA: Diagnosis not present

## 2023-12-11 DIAGNOSIS — R413 Other amnesia: Secondary | ICD-10-CM

## 2023-12-11 DIAGNOSIS — Z743 Need for continuous supervision: Secondary | ICD-10-CM | POA: Diagnosis not present

## 2023-12-11 LAB — BASIC METABOLIC PANEL
Anion gap: 10 (ref 5–15)
BUN: 18 mg/dL (ref 8–23)
CO2: 25 mmol/L (ref 22–32)
Calcium: 9.1 mg/dL (ref 8.9–10.3)
Chloride: 106 mmol/L (ref 98–111)
Creatinine, Ser: 0.96 mg/dL (ref 0.44–1.00)
GFR, Estimated: 59 mL/min — ABNORMAL LOW (ref >60)
Glucose, Bld: 92 mg/dL (ref 70–99)
Potassium: 4.2 mmol/L (ref 3.5–5.1)
Sodium: 141 mmol/L (ref 135–145)

## 2023-12-11 LAB — CBC
HCT: 39.3 % (ref 36.0–46.0)
Hemoglobin: 12.4 g/dL (ref 12.0–15.0)
MCH: 29.1 pg (ref 26.0–34.0)
MCHC: 31.6 g/dL (ref 30.0–36.0)
MCV: 92.3 fL (ref 80.0–100.0)
Platelets: 281 10*3/uL (ref 150–400)
RBC: 4.26 MIL/uL (ref 3.87–5.11)
RDW: 14.6 % (ref 11.5–15.5)
WBC: 8.4 10*3/uL (ref 4.0–10.5)
nRBC: 0 % (ref 0.0–0.2)

## 2023-12-11 NOTE — ED Triage Notes (Addendum)
 BIBA urinary frequency.  Pt reports on Macrobid chronically for UTI prevention Hx dementia.  Ems reports 80/50 on arrival. Admin NS.

## 2023-12-11 NOTE — Patient Outreach (Addendum)
  Care Coordination   Initial Visit Note   12/13/2023 Name: Kelly Kennedy MRN: [id number] DOB: 02-Sep-1941  Kelly Kennedy is a 83 y.o. year old female who sees [name] Arlyss RAMAN, MD for primary care. I spoke with  Jalyiah Macario Must [name] by phone today.  What matters to the patients health and wellness today?  Patient's daughter concerned that patient is not taking her medications as prescribed. Patient able to take care of all of her ADL's independently, however medication adherence remains a challenge. Referral completed for pharmacy to address medication adherence.    Goals Addressed             This Visit's Progress    Increase medication adherance       Activities and task to complete in order to accomplish goals.   Keep all upcoming appointment discussed today Continue taking medication prescribed by Doctor-A referral with the [facility] pharmacist has bee placed to assist with compliance Please consider referral to area Day Programs for socialization and/or all inclusive care         SDOH assessments and interventions completed:  Yes  SDOH Interventions Today    Flowsheet Row Most Recent Value  SDOH Interventions   Food Insecurity Interventions Intervention Not Indicated  Housing Interventions Intervention Not Indicated  Transportation Interventions Intervention Not Indicated  Utilities Interventions Intervention Not Indicated  Social Connections Interventions --  [offered Day Program options for socialization]        Care Coordination Interventions:  Yes, provided  Interventions Today    Flowsheet Row Most Recent Value  Chronic Disease   Chronic disease during today's visit Hypertension (HTN), Other  [memory loss]  General Interventions   General Interventions Discussed/Reviewed General Interventions Discussed, [facility], Level of Care  [Assessed for management of in home care needs]  Doctor Visits Discussed/Reviewed Doctor  Visits Discussed, PCP  [12/12/23 PCP]  Level of Care Adult Daycare  [Discussed Adult Day Care options for socialization and all inclusive care-[facility] will discuss options with patient]  Mental Health Interventions   Mental Health Discussed/Reviewed Mental Health Discussed, Grief and Loss  [discussed possibility for outpatient mental health follow up to address challenges with grief and loss]  Pharmacy Interventions   Pharmacy Dicussed/Reviewed Pharmacy Topics Discussed, Medication Adherence, Referral to Pharmacist  [challenges with medication adherance discussed-alternatives to improve discussed ie alarm med box, alarm on smart phone]  Medication Adherence --  [per daughter-not taking medication as prescribed]  Safety Interventions   Safety Discussed/Reviewed Safety Discussed  [encouraged use of assistive devices while ambulating to avoid falls]       Follow up plan: Follow up call scheduled for 12/14/23    Encounter Outcome:  Patient Visit Completed

## 2023-12-11 NOTE — Patient Instructions (Addendum)
 Visit Information  Thank you for taking time to visit with me today. Please don't hesitate to contact me if I can be of assistance to you.   Following are the goals we discussed today:   Goals Addressed             This Visit's Progress    Increase medication adherance       Activities and task to complete in order to accomplish goals.   Keep all upcoming appointment discussed today Continue taking medication prescribed by Doctor-A referral with the [facility] pharmacist has bee placed to assist with compliance Please consider referral to area Day Programs for socialization and/or all inclusive care         Our next appointment is by telephone on 12/14/23 at 3:30pm  Please call the care guide team at [phone] if you need to cancel or reschedule your appointment.   If you are experiencing a Mental Health or Behavioral Health Crisis or need someone to talk to, please call 911   Patient verbalizes understanding of instructions and care plan provided today and agrees to view in MyChart. Active MyChart status and patient understanding of how to access instructions and care plan via MyChart confirmed with patient.     Telephone follow up appointment with care management team member scheduled for: 12/14/23  Lenn Mean, LCSW [place]  [facility], [facility] Licensed Clinical Social Worker Care Coordinator  Direct Dial: [phone]

## 2023-12-12 ENCOUNTER — Ambulatory Visit: Payer: 59 | Admitting: General Practice

## 2023-12-12 ENCOUNTER — Telehealth: Payer: Self-pay

## 2023-12-12 NOTE — Telephone Encounter (Signed)
 Kelly Kennedy

## 2023-12-12 NOTE — Telephone Encounter (Signed)
 I spoke with Kelly Kennedy to verify wanted to cancel appt since pt LWBS at ED on 12/11/23. Kelly Kennedy said wanted labs done at ED reviewed and then see if pt needed to be seen. Pt left ED with IV in arm and wants that removed also. Spoke with Kelly Kennedy CMA team lead and was advised needs to go back to  ED for eval and to have IV removed and FU with Dr Kelly Kennedy. Kelly Kennedy notified and voiced understanding and will take pt to Kelly Kennedy ED when Kelly Kennedy gets off work later today. ED precautions given and Kelly Kennedy voiced understanding. Kelly Kennedy said pt seems to be OK today. Offered FU appt with Dr Kelly Kennedy but Kelly Kennedy will ck her schedule and cb to schedule ED FU with Dr Kelly Kennedy. Sending note to Dr Kelly Kennedy.

## 2023-12-12 NOTE — Telephone Encounter (Signed)
 Noted. Thanks.

## 2023-12-14 ENCOUNTER — Ambulatory Visit: Payer: Self-pay | Admitting: *Deleted

## 2023-12-14 ENCOUNTER — Telehealth: Payer: Self-pay

## 2023-12-14 NOTE — Patient Outreach (Signed)
  Care Coordination   Follow Up Visit Note   12/14/2023 Name: Kelly Kennedy MRN: [id number] DOB: [birth year]  Kelly Kennedy is a 84 y.o. year old female who sees Joaquim Nam, MD for primary care. I spoke with  Kelaiah Ty Hilts daughter Kelly Kennedy by phone today.  What matters to the patients health and wellness today?  Medication adherence    Goals Addressed             This Visit's Progress    Increase medication adherance       Activities and task to complete in order to accomplish goals.   Keep all upcoming appointment discussed today Continue taking medication prescribed by Doctor-A referral to [facility] pharmacy has been placed to assist with alternatives to improve adherence-appointment scheduled for 12/20/23 Please consider referral to area Day Programs for socialization and/or all inclusive care once medication adherence is resolved         SDOH assessments and interventions completed:  No     Care Coordination Interventions:  Yes, provided  Interventions Today    Flowsheet Row Most Recent Value  Chronic Disease   Chronic disease during today's visit Hypertension (HTN), Other  [memory loss]  General Interventions   General Interventions Discussed/Reviewed General Interventions Reviewed, Level of Care  [continued assessment of management of in home care needs. Patient's daughter continues to discuss concerns related to medication adherance]  Level of Care Adult Daycare  [Patient's daughter willing to consider Adult Day Programs for patient , however would like to consider after challenges with medication adherence is resolved]  Pharmacy Interventions   Pharmacy Dicussed/Reviewed Pharmacy Topics Discussed, Referral to Pharmacist  [appt with pharmacy 1/22]  Medication Adherence Not taking medication  [Patient's daughter researching alternatives for medication reminders to improve adherence]       Follow up plan: Follow up call scheduled for  12/27/23    Encounter Outcome:  Patient Visit Completed

## 2023-12-14 NOTE — Progress Notes (Signed)
Care Guide Pharmacy Note  12/14/2023 Name: Kelly Kennedy MRN: [id number] DOB: [birth year]  Referred By: Joaquim Nam, MD Reason for referral: Care Coordination (Outreach to schedule with Pharm d )   Kelly Kennedy is a 83 y.o. year old female who is a primary care patient of Joaquim Nam, MD.  Kelly Kennedy was referred to the pharmacist for assistance related to:  memory loss  Successful contact was made with the patient to discuss pharmacy services including being ready for the pharmacist to call at least 5 minutes before the scheduled appointment time and to have medication bottles and any blood pressure readings ready for review. The patient agreed to meet with the pharmacist via telephone visit on (date/time).12/20/2023  Penne Lash , RMA     [place]  [facility], [facility] Guide  Direct Dial: [phone]  Website: [email or web address]

## 2023-12-14 NOTE — Patient Instructions (Signed)
Visit Information  Thank you for taking time to visit with me today. Please don't hesitate to contact me if I can be of assistance to you.   Following are the goals we discussed today:   Goals Addressed             This Visit's Progress    Increase medication adherance       Activities and task to complete in order to accomplish goals.   Keep all upcoming appointment discussed today Continue taking medication prescribed by Doctor-A referral to [facility] pharmacy has been placed to assist with alternatives to improve adherence-appointment scheduled for 12/20/23 Please consider referral to area Day Programs for socialization and/or all inclusive care once medication adherence is resolved         Our next appointment is by telephone on 12/27/23 at 1pm  Please call the care guide team at [phone] if you need to cancel or reschedule your appointment.   If you are experiencing a Mental Health or Behavioral Health Crisis or need someone to talk to, please call 911   Patient verbalizes understanding of instructions and care plan provided today and agrees to view in MyChart. Active MyChart status and patient understanding of how to access instructions and care plan via MyChart confirmed with patient.     Telephone follow up appointment with care management team member scheduled for: 12/27/23  Verna Czech, LCSW [place]  [facility], [facility] Licensed Clinical Social Worker Care Coordinator  Direct Dial: [phone]

## 2023-12-20 ENCOUNTER — Other Ambulatory Visit: Payer: 59

## 2023-12-20 NOTE — Progress Notes (Deleted)
12/20/2023 Name: Kelly Kennedy MRN: [id number] DOB: 12/22/1940  Subjective  No chief complaint on file.  Care Team: Primary Care Provider: Joaquim Nam, MD  Reason for visit: Kelly Kennedy is a 83 y.o. year old female who presented for a telephone visit.   They were referred to the pharmacist by their PCP for assistance in managing  medication compliance in the setting of memory concerns . ; Patient's daughter contacted for medication review/reconciliation visit.   Medication Access/Adherence: ?  Prescription drug coverage: Payor: Advertising copywriter MEDICARE / Plan: Suan Halter DUAL COMPLETE / Product Type: *No Product type* / .   - Reports that all medications are affordable: {YES/NO:21197} - Medication packaging: {LZ med packaging:31508} - Patient manages their own medications/refills: {YES/NO:21197} - Uses Pillbox: {YES/NO:21197} - Missed medication doses: {LZ med adherence:31509}  HPI: [facility] [place]   Currently packaged in a pill box (separated into time slot). Though patient still   Doesn't remember to take her medications on time. Or sometimes will forget that she has taken doses and will double  Has tried to place order for a pill box with an alarm on it.    Household of one [phone]   Social Worker - assistance with medication Pill Box taking   Patient {HAS/DOES NOT NFAO:13086} their medications with then at the time of the visit.   Outpatient Encounter Medications as of 12/20/2023  Medication Sig   amitriptyline (ELAVIL) 50 MG tablet Take 1-2 tablets (50-100 mg total) by mouth at bedtime as needed (dispense w/o red dye). for sleep   carvedilol (COREG) 12.5 MG tablet Take 1 tablet (12.5 mg total) by mouth 2 (two) times daily.   cholestyramine (QUESTRAN) 4 g packet Take 1 packet (4 g total) by mouth 3 (three) times daily as needed.   cyanocobalamin (,VITAMIN B-12,) 1000  MCG/ML injection Inject 1 mL (1,000 mcg total) into the muscle every 30 (thirty) days.   diclofenac Sodium (VOLTAREN) 1 % GEL APPLY 2 GRAMS TOPICALLY 4 TIMES A DAY AS NEEDED   Evolocumab (REPATHA SURECLICK) 140 MG/ML SOAJ ADMINISTER 140 MG UNDER THE SKIN EVERY 14 DAYS   fluocinonide cream (LIDEX) 0.05 % Apply topically daily as needed.  Use sparingly.   isosorbide mononitrate (IMDUR) 30 MG 24 hr tablet TAKE 1 TABLET BY MOUTH EVERY DAY   levothyroxine (SYNTHROID) 75 MCG tablet TAKE 1 TABLET BY MOUTH EVERY DAY BEFORE BREAKFAST   memantine (NAMENDA) 5 MG tablet 10mg  in the AM and 5mg  in the PM for 1 week then 10mg  twice a day.   mometasone (NASONEX) 50 MCG/ACT nasal spray Place 2 sprays into the nose daily.   nitrofurantoin (MACRODANTIN) 100 MG capsule Take 1 capsule (100 mg total) by mouth daily.   nitroGLYCERIN (NITROSTAT) 0.4 MG SL tablet Place 1 tablet (0.4 mg total) under the tongue every 5 (five) minutes as needed for chest pain.   omeprazole (PRILOSEC) 40 MG capsule TAKE 1 CAPSULE (40 MG TOTAL) BY MOUTH DAILY.   solifenacin (VESICARE) 5 MG tablet TAKE 1 TABLET (5 MG TOTAL) BY MOUTH DAILY.   No facility-administered encounter medications on file as of 12/20/2023.     Assessment and Plan:   Medication Access All medications were reviewed with patient today and medication list was updated as appropriate.  Barriers identified / Adherence concerns: *** ***   Medications as of ***  ? Medication name Instructions  Notes   Diabetes***      ***     ***     ***     ***     ***     ***  Hypertension***     ***     ***     ***     ***     ***     Hyperlipidemia***     ***     ***     ***     ***     ***     ***     ***     ***     ***     ***     ***     ***     ***     ***     ***       Future Appointments  Date Time Provider Department Center  12/20/2023  3:00 PM [facility] CCM PHARMACIST [facility]  12/27/2023  1:00 PM [place], [place], [place] [facility] None   01/03/2024  3:00 PM Otho Ket, RN [facility] None  02/15/2024  1:40 PM [facility] ANNUAL WELLNESS VISIT 1 [facility]    Loree Fee, PharmD Clinical Pharmacist [facility] [phone]

## 2023-12-27 ENCOUNTER — Ambulatory Visit: Payer: Self-pay | Admitting: *Deleted

## 2023-12-27 NOTE — Patient Instructions (Signed)
Visit Information  Thank you for taking time to visit with me today. Please don't hesitate to contact me if I can be of assistance to you.   Following are the goals we discussed today:   Goals Addressed             This Visit's Progress    Increase medication adherance       Activities and task to complete in order to accomplish goals.   Keep all upcoming appointment discussed today Continue to follow up with plan to obtain a pill box with alarm  Please continue to consider referral to area Day Programs for socialization and/or all inclusive care once medication adherence is resolved         Our next appointment is by telephone on 02/26/24 at 1pm  Please call the care guide team at [phone] if you need to cancel or reschedule your appointment.   If you are experiencing a Mental Health or Behavioral Health Crisis or need someone to talk to, please call 911   Patient verbalizes understanding of instructions and care plan provided today and agrees to view in MyChart. Active MyChart status and patient understanding of how to access instructions and care plan via MyChart confirmed with patient.     Telephone follow up appointment with care management team member scheduled for: 02/26/24  Verna Czech, LCSW [place]  [facility], [facility] Licensed Clinical Social Worker Care Coordinator  Direct Dial: [phone]

## 2023-12-27 NOTE — Patient Outreach (Signed)
  Care Coordination   Follow Up Visit Note   12/27/2023 Name: Kelly Kennedy MRN: [id number] DOB: [birth year]  Kelly Kennedy is a 83 y.o. year old female who sees Joaquim Nam, MD for primary care. I spoke with  Marycatherine Ty Hilts daughter by phone today.  What matters to the patients health and wellness today?  Medication Adherance    Goals Addressed             This Visit's Progress    Increase medication adherance       Activities and task to complete in order to accomplish goals.   Keep all upcoming appointment discussed today Continue to follow up with plan to obtain a pill box with alarm  Please continue to consider referral to area Day Programs for socialization and/or all inclusive care once medication adherence is resolved         SDOH assessments and interventions completed:  No     Care Coordination Interventions:  Yes, provided  Interventions Today    Flowsheet Row Most Recent Value  Chronic Disease   Chronic disease during today's visit Hypertension (HTN)  General Interventions   General Interventions Discussed/Reviewed General Interventions Reviewed  [Continued assessment of patient's community resource needs related to increasing socialization and medication adherance]  Doctor Visits Discussed/Reviewed Doctor Visits Discussed, PCP  Level of Care Adult Daycare  [continued to to discuss the benefit of Adult Day Care]  Education Interventions   Education Provided Provided Education  Provided Verbal Education On Other  [full medicaid eligibility criteria]  Mental Health Interventions   Mental Health Discussed/Reviewed Mental Health Reviewed, Grief and Loss  [discussed concern NW:GNFAOZH statement of feeling like a burden to her family due to her care needs-encouraged increasing socialization activitiies as well as ongoing mental health follow up]  Pharmacy Interventions   Pharmacy Dicussed/Reviewed Pharmacy Topics Discussed  [confirmed  plan to order Alarm Pill bx to assist with reminder to make medication-will also consider arranging for someone to come by to assist with medication reminders as well]  Safety Interventions   Safety Discussed/Reviewed Safety Discussed  [086 in the event of a mental health crisis]  Advanced Directive Interventions   Advanced Directives Discussed/Reviewed Advanced Directives Discussed       Follow up plan: Follow up call scheduled for 02/26/24    Encounter Outcome:  Patient Visit Completed

## 2024-01-03 ENCOUNTER — Ambulatory Visit: Payer: Self-pay

## 2024-01-04 ENCOUNTER — Ambulatory Visit: Payer: Self-pay | Admitting: *Deleted

## 2024-01-04 NOTE — Patient Outreach (Signed)
  Care Coordination   Follow Up Visit Note   01/04/2024 Name: Kelly Kennedy MRN: [id number] DOB: 11-29-1940  Kelly Kennedy is a 83 y.o. year old female who sees [name] Arlyss RAMAN, MD for primary care. I spoke with  Netasha Macario Gavel by phone today.  What matters to the patients health and wellness today?  Daughter reports having call from pharmacist to discuss concerns about patient forgetting to take medications and/ or taking all medications at 1 time.  Daughter states pharmacist suggested patient use a pill box as well as medication alarm for reminders.  Daughter states she manages patients medications weekly by dispensing medications into pill box.  Daughter states she is working daily and unable to oversee that patient is taking the pills correctly.  Daughter states she has ordered a medication alarm reminder which is due to arrive any day.  Daughter states patient checks BP on occasion.  She denies patient reporting any recent falls. Daughter states nurse with patients insurance plan made a visit within the past few weeks. Daughter states patient mentioned upon being asked that she has had thoughts of hurting herself however denies having a plan.  Daughter states patient feels she is being a burden to her.  Daughter states she has told patient she is not a burden she just wants her to take her medications correctly.     Goals Addressed             This Visit's Progress    Develop plan of care for management of coronary artery disease/ memory concerns       Interventions Today    Flowsheet Row Most Recent Value  Chronic Disease   Chronic disease during today's visit Hypertension (HTN), Other  [(CAD, right knee pain, falls.)]  General Interventions   General Interventions Discussed/Reviewed General Interventions Reviewed, Doctor Visits, Communication with  [evaluation of current treatment plan for listed health conditions and patients adherence to plan as established by  provider. Assessed chest pain, new or ongoing symptoms.]  Doctor Visits Discussed/Reviewed Doctor Visits Reviewed  [reviewed upcoming provider visits. Advised to keep follow with providers.]  Communication with Social Work  [place] with social worker regarding daughters concerns that patient stated she has thought of hurting herself. Agreed that social worker would move patients appointment for contact to 01/04/24.]  Education Interventions   Education Provided Provided Education  [Advised to monitor blood pressure at least 3-4 times per week and record. Advised to notify provider of blood pressures outside of established parameter.]  Mental Health Interventions   Mental Health Discussed/Reviewed Mental Health Reviewed  [Offered to have social worker follow up with patient to address depression/ anxiety/ thoughts of hurting self concerns.]  Pharmacy Interventions   Pharmacy Dicussed/Reviewed Pharmacy Topics Reviewed  [medications review and compliance discussed. Inquired if medication alarm was received.]  Safety Interventions   Safety Discussed/Reviewed Fall Risk  [Falls assessed. Discussed fall prevention.. Advised to use suggested ambulatory device as recommended by provider.]              SDOH assessments and interventions completed:  No     Care Coordination Interventions:  Yes, provided   Follow up plan: Follow up call scheduled for 01/04/24 at 11 am    Encounter Outcome:  Patient Visit Completed   Ying Blankenhorn RN, BSN, CCM [place]  [facility], Population Health Case Manager Phone: [phone]

## 2024-01-04 NOTE — Patient Instructions (Signed)
 Visit Information  Thank you for taking time to visit with me today. Please don't hesitate to contact me if I can be of assistance to you.   Following are the goals we discussed today:   Goals Addressed             This Visit's Progress    Care coordination activities         EMOTIONAL / MENTAL HEALTH SUPPORT Keep all upcoming appointment discussed today Continue to take medication  prescribed by Doctor Self Support options  (continue to reach out to family for assistance and support, continue the practice of establishing boundaries and verbalizing expectations with family members, continue to consider ongoing mental health counseling if needed )         Our next appointment is by telephone on 01/25/24 at 1pm  Please call the care guide team at [phone] if you need to cancel or reschedule your appointment.   If you are experiencing a Mental Health or Behavioral Health Crisis or need someone to talk to, please call 911   Patient verbalizes understanding of instructions and care plan provided today and agrees to view in MyChart. Active MyChart status and patient understanding of how to access instructions and care plan via MyChart confirmed with patient.     Telephone follow up appointment with care management team member scheduled for: 01/25/24  Lenn Mean, LCSW [place]  [facility], [facility] Licensed Clinical Social Worker Care Coordinator  Direct Dial: [phone]

## 2024-01-04 NOTE — Patient Instructions (Signed)
 Visit Information  Thank you for taking time to visit with me today. Please don't hesitate to contact me if I can be of assistance to you.   Following are the goals we discussed today:   Goals Addressed             This Visit's Progress    Develop plan of care for management of coronary artery disease/ memory concerns       Interventions Today    Flowsheet Row Most Recent Value  Chronic Disease   Chronic disease during today's visit Hypertension (HTN), Other  [(CAD, right knee pain, falls.)]  General Interventions   General Interventions Discussed/Reviewed General Interventions Reviewed, Doctor Visits, Communication with  [evaluation of current treatment plan for listed health conditions and patients adherence to plan as established by provider. Assessed chest pain, new or ongoing symptoms.]  Doctor Visits Discussed/Reviewed Doctor Visits Reviewed  [reviewed upcoming provider visits. Advised to keep follow with providers.]  Communication with Social Work  [place] with social worker regarding daughters concerns that patient stated she has thought of hurting herself. Agreed that social worker would move patients appointment for contact to 01/04/24.]  Education Interventions   Education Provided Provided Education  [Advised to monitor blood pressure at least 3-4 times per week and record. Advised to notify provider of blood pressures outside of established parameter.]  Mental Health Interventions   Mental Health Discussed/Reviewed Mental Health Reviewed  [Offered to have social worker follow up with patient to address depression/ anxiety/ thoughts of hurting self concerns.]  Pharmacy Interventions   Pharmacy Dicussed/Reviewed Pharmacy Topics Reviewed  [medications review and compliance discussed. Inquired if medication alarm was received.]  Safety Interventions   Safety Discussed/Reviewed Fall Risk  [Falls assessed. Discussed fall prevention.. Advised to use suggested ambulatory device  as recommended by provider.]              Our next appointment is by telephone on 01/04/24 at 11 am  Please call the care guide team at [phone] if you need to cancel or reschedule your appointment.   If you are experiencing a Mental Health or Behavioral Health Crisis or need someone to talk to, please call the Suicide and Crisis Lifeline: 988 call 1-800-273-TALK (toll free, 24 hour hotline)  Patient verbalizes understanding of instructions and care plan provided today and agrees to view in MyChart. Active MyChart status and patient understanding of how to access instructions and care plan via MyChart confirmed with patient.     Arvin Seip RN, BSN, CCM [employer], Population Health Case Manager Phone: [phone]

## 2024-01-04 NOTE — Patient Outreach (Signed)
  Care Coordination   Follow Up Visit Note   01/04/2024 Name: Kelly Kennedy MRN: [id number] DOB: [birth year]  Kelly Kennedy is a 83 y.o. year old female who sees [name] Arlyss RAMAN, MD for primary care. I spoke with  Kelly Kennedy by phone today.  What matters to the patients health and wellness today?  Patient contacted to assess for mental health needs. Patient's daughter states that there were comments made by patient regarding thoughts of self harm. Patient denies current thoughts of self harm, however confirms episodes of sadness at time. Patient agreeable to follow up with this social worker to continue to discuss coping strategies.   Goals Addressed             This Visit's Progress    Care coordination activities         EMOTIONAL / MENTAL HEALTH SUPPORT Keep all upcoming appointment discussed today Continue to take medication  prescribed by Doctor Self Support options  (continue to reach out to family for assistance and support, continue the practice of establishing boundaries and verbalizing expectations with family members, continue to consider ongoing mental health counseling if needed )         SDOH assessments and interventions completed:  Yes  SDOH Interventions Today    Flowsheet Row Most Recent Value  SDOH Interventions   Food Insecurity Interventions Intervention Not Indicated  Housing Interventions Intervention Not Indicated  Transportation Interventions Intervention Not Indicated  Utilities Interventions Intervention Not Indicated  Depression Interventions/Treatment  Counseling        Care Coordination Interventions:  Yes, provided  Interventions Today    Flowsheet Row Most Recent Value  Chronic Disease   Chronic disease during today's visit Hypertension (HTN), Other  [CAD, right knee pain, falls]  General Interventions   General Interventions Discussed/Reviewed General Interventions Discussed  [patient contacted to assess for  community resource/mental health needs]  Doctor Visits Discussed/Reviewed Doctor Visits Discussed  Mental Health Interventions   Mental Health Discussed/Reviewed Mental Health Discussed, Coping Strategies, Depression  [MH needs assessed-denies thoughts of self harm -confirms conflicts with sister being the main trigger-coping strategies discussed including setting clear boundaries and expectations, self care and accepting assistance from daughter emphasized]  Pharmacy Interventions   Pharmacy Dicussed/Reviewed Pharmacy Topics Discussed, Medication Adherence  [medication adherance discussed-daughter visiting daily to ensure medications are taken correctly-daughter fills pill box daily]  Safety Interventions   Safety Discussed/Reviewed Safety Discussed  [pt encouraged to contact 988 MH crisis line in the event of a mental health crisis-daughter transports to medical appointments-use of cain encouraged to avoid falls]       Follow up plan: Follow up call scheduled for 01/25/24    Encounter Outcome:  Patient Visit Completed

## 2024-01-15 DIAGNOSIS — H34831 Tributary (branch) retinal vein occlusion, right eye, with macular edema: Secondary | ICD-10-CM | POA: Diagnosis not present

## 2024-01-16 ENCOUNTER — Ambulatory Visit (INDEPENDENT_AMBULATORY_CARE_PROVIDER_SITE_OTHER): Payer: 59

## 2024-01-16 DIAGNOSIS — E538 Deficiency of other specified B group vitamins: Secondary | ICD-10-CM | POA: Diagnosis not present

## 2024-01-16 MED ORDER — CYANOCOBALAMIN 1000 MCG/ML IJ SOLN
1000.0000 ug | Freq: Once | INTRAMUSCULAR | Status: AC
  Administered 2024-01-16: 1000 ug via INTRAMUSCULAR

## 2024-01-16 NOTE — Progress Notes (Signed)
Per orders of Dr. Crawford Givens, injection of B-12 given by Melina Copa in left deltoid. Patient tolerated injection well. Patient will make appointment for 1 month.

## 2024-01-25 ENCOUNTER — Ambulatory Visit: Payer: Self-pay | Admitting: *Deleted

## 2024-01-26 ENCOUNTER — Other Ambulatory Visit: Payer: Self-pay | Admitting: Family Medicine

## 2024-01-26 ENCOUNTER — Other Ambulatory Visit: Payer: Self-pay | Admitting: Cardiovascular Disease

## 2024-01-26 DIAGNOSIS — I214 Non-ST elevation (NSTEMI) myocardial infarction: Secondary | ICD-10-CM

## 2024-01-26 DIAGNOSIS — I25118 Atherosclerotic heart disease of native coronary artery with other forms of angina pectoris: Secondary | ICD-10-CM

## 2024-01-26 NOTE — Patient Outreach (Signed)
 Care Coordination   Follow Up Visit Note   01/26/2024 late entry Name: Kelly Kennedy MRN: [id number] DOB: October 12, 1941  Kelly Kennedy is a 83 y.o. year old female who sees Joaquim Nam, MD for primary care. I spoke with  Kelly Kennedy by phone on 01/25/24.  What matters to the patients health and wellness today?  Patient reports improvement in mood, states that relationship challenges have improved. Daughter confirms obtaining a automatic medication dispenser but is unsure how to use it. Encouraged to contact pharmacist or manufacture [employer] for assistance   Goals Addressed             This Visit's Progress    Care coordination activities         EMOTIONAL / MENTAL HEALTH SUPPORT Keep all upcoming appointment discussed today Continue to take medication  prescribed by Doctor Daughter to contact pharmacist at the provider's office for assistance with medication dispenser Self Support options  (continue to utilize coping strategies discussed today: reaching  out to family for assistance and support, continue the practice of establishing boundaries and verbalizing expectations with family members, continue to consider ongoing mental health counseling if needed         SDOH assessments and interventions completed:  No     Care Coordination Interventions:  Yes, provided  Interventions Today    Flowsheet Row Most Recent Value  Chronic Disease   Chronic disease during today's visit Hypertension (HTN)  [CAD, right knee pain, falls]  General Interventions   General Interventions Discussed/Reviewed General Interventions Reviewed, Doctor Visits, Communication with  [patient assessed for continued community resource/mental health needs]  Doctor Visits Discussed/Reviewed Doctor Visits Reviewed  [3/18 Vitamin B12, 3/20 AWV]  Communication with RN  [regarding medication dispenser concerns]  Mental Health Interventions   Mental Health Discussed/Reviewed Mental Health  Reviewed, Coping Strategies, Depression  [MH needs addressed, connfirms that relationship with family member has improved-reports mood has "leveled out"-continues to utilize postive coping methods to manage challenges ie establishing boundaries, perspective and use of her spiritual faith]  Pharmacy Interventions   Pharmacy Dicussed/Reviewed Pharmacy Topics Discussed, Medication Adherence  [daughter confirmed that she was able to get a pill box that automatically dispenses the medication but is not sure how to use it. Will plan to follow up with her doctors office-RN will be notiified as well]       Follow up plan: Follow up call scheduled for 02/07/24    Encounter Outcome:  Patient Visit Completed

## 2024-01-26 NOTE — Telephone Encounter (Signed)
 Copied from CRM [id number]. Topic: Clinical - Medication Refill >> Jan 26, 2024  2:11 PM Gurney Maxin H wrote: Most Recent Primary Care Visit:  Provider: Melina Copa  Department: [facility]  Visit Type: CLINICAL SUPPORT  Date: 01/16/2024  Medication: memantine (NAMENDA) 5 MG tablet  Has the patient contacted their pharmacy? Yes, Pharmacy states not to be refilled until May 2025 and patient takes 4 pills a day. (Agent: If no, request that the patient contact the pharmacy for the refill. If patient does not wish to contact the pharmacy document the reason why and proceed with request.) (Agent: If yes, when and what did the pharmacy advise?)  Is this the correct pharmacy for this prescription? Yes If no, delete pharmacy and type the correct one.  This is the patient's preferred pharmacy:  [id number] [place],  - [address] AT [facility] [address] [place] [address] Phone: [phone] Fax: [phone]    Has the prescription been filled recently? No  Is the patient out of the medication? Yes  Has the patient been seen for an appointment in the last year OR does the patient have an upcoming appointment? Yes  Can we respond through MyChart? No  Agent: Please be advised that Rx refills may take up to 3 business days. We ask that you follow-up with your pharmacy.

## 2024-01-26 NOTE — Telephone Encounter (Signed)
 Should this be changed to 10mg  tabs now?

## 2024-01-26 NOTE — Patient Instructions (Signed)
 Visit Information  Thank you for taking time to visit with me today. Please don't hesitate to contact me if I can be of assistance to you.   Following are the goals we discussed today:   Goals Addressed             This Visit's Progress    Care coordination activities         EMOTIONAL / MENTAL HEALTH SUPPORT Keep all upcoming appointment discussed today Continue to take medication  prescribed by Doctor Daughter to contact pharmacist at the provider's office for assistance with medication dispenser Self Support options  (continue to utilize coping strategies discussed today: reaching  out to family for assistance and support, continue the practice of establishing boundaries and verbalizing expectations with family members, continue to consider ongoing mental health counseling if needed         Our next appointment is by telephone on 02/07/24 at 4pm  Please call the care guide team at [phone] if you need to cancel or reschedule your appointment.   If you are experiencing a Mental Health or Behavioral Health Crisis or need someone to talk to, please call 911   Patient verbalizes understanding of instructions and care plan provided today and agrees to view in MyChart. Active MyChart status and patient understanding of how to access instructions and care plan via MyChart confirmed with patient.     Telephone follow up appointment with care management team member scheduled for: 02/07/24  Verna Czech, LCSW [place]  [facility], [facility] Licensed Clinical Social Worker Care Coordinator  Direct Dial: [phone]

## 2024-01-28 MED ORDER — MEMANTINE HCL 10 MG PO TABS: 10.0000 mg | ORAL_TABLET | Freq: Two times a day (BID) | ORAL | 1 refills | Status: DC

## 2024-01-28 NOTE — Telephone Encounter (Signed)
 Notify pt/family.  Changed to 10mg  per dose, 1 pill BID.

## 2024-01-30 ENCOUNTER — Telehealth: Payer: Self-pay | Admitting: Family Medicine

## 2024-01-30 NOTE — Telephone Encounter (Signed)
 Copied from CRM [id number]. Topic: General - Other >> Jan 30, 2024  2:35 PM Almira Coaster wrote: Reason for CRM: [facility] will be re-faxing documentation to the office regarding medication recommendation. Original fax was sent on 01/11/2024 and they have not gotten a response.

## 2024-01-30 NOTE — Telephone Encounter (Signed)
 Please have them refax.  I don't recall seeing it.  Thanks.

## 2024-01-30 NOTE — Telephone Encounter (Signed)
 I don't recall receiving anything from [facility] about this patient. Do you? If not we will just await the new fax/

## 2024-01-31 ENCOUNTER — Other Ambulatory Visit: Payer: Self-pay | Admitting: Family Medicine

## 2024-01-31 NOTE — Telephone Encounter (Signed)
 Copied from CRM [id number]. Topic: Clinical - Medication Refill >> Jan 31, 2024  2:53 PM Kelly Kennedy wrote: Most Recent Primary Care Visit:  Provider: Melina Copa  Department: [facility]  Visit Type: CLINICAL SUPPORT  Date: 01/16/2024  Medication: diclofenac Sodium (VOLTAREN) 1 % GEL  Has the patient contacted their pharmacy? No (Agent: If no, request that the patient contact the pharmacy for the refill. If patient does not wish to contact the pharmacy document the reason why and proceed with request.) (Agent: If yes, when and what did the pharmacy advise?)  Is this the correct pharmacy for this prescription? Yes If no, delete pharmacy and type the correct one.  This is the patient's preferred pharmacy:  [id number] [place], [place] - [address] [facility] [address] [place] [address] Phone: [phone] Fax: [phone]  Has the prescription been filled recently? Yes  Is the patient out of the medication? Yes  Has the patient been seen for an appointment in the last year OR does the patient have an upcoming appointment? Yes  Can we respond through MyChart? No  Agent: Please be advised that Rx refills may take up to 3 business days. We ask that you follow-up with your pharmacy.

## 2024-02-03 MED ORDER — DICLOFENAC SODIUM 1 % EX GEL: 2.0000 g | Freq: Four times a day (QID) | CUTANEOUS | 3 refills | Status: AC | PRN

## 2024-02-03 NOTE — Telephone Encounter (Signed)
 Sent. Thanks.

## 2024-02-04 ENCOUNTER — Encounter

## 2024-02-04 ENCOUNTER — Encounter (HOSPITAL_COMMUNITY): Payer: Self-pay

## 2024-02-04 DIAGNOSIS — R739 Hyperglycemia, unspecified: Secondary | ICD-10-CM | POA: Diagnosis not present

## 2024-02-04 DIAGNOSIS — I499 Cardiac arrhythmia, unspecified: Secondary | ICD-10-CM | POA: Diagnosis not present

## 2024-02-04 DIAGNOSIS — Z743 Need for continuous supervision: Secondary | ICD-10-CM | POA: Diagnosis not present

## 2024-02-04 DIAGNOSIS — R404 Transient alteration of awareness: Secondary | ICD-10-CM | POA: Diagnosis not present

## 2024-02-04 DIAGNOSIS — R569 Unspecified convulsions: Secondary | ICD-10-CM | POA: Diagnosis not present

## 2024-02-06 NOTE — Telephone Encounter (Signed)
 Left voicemail for patient to return call to office.

## 2024-02-07 ENCOUNTER — Ambulatory Visit: Payer: Self-pay | Admitting: *Deleted

## 2024-02-07 NOTE — Telephone Encounter (Signed)
 Left voicemail for patient to return call to office.

## 2024-02-08 ENCOUNTER — Telehealth: Payer: Self-pay

## 2024-02-08 NOTE — Patient Outreach (Addendum)
 Care Coordination   Follow Up Visit Note 02/08/2024 Name: Sruti Naiah Donahoe MRN: [id number] DOB: Apr 06, 1941  Aspynn Corina Stacy is a 83 y.o. year old female who sees Joaquim Nam, MD for primary care. I spoke with  Cammy Ty Hilts daughter by phone on 02/07/24  What matters to the patients health and wellness today?  Medication adherance    Goals Addressed             This Visit's Progress    Care coordination activities         EMOTIONAL / MENTAL HEALTH SUPPORT Keep all upcoming appointment discussed today Continue to take medication  prescribed by Doctor Daughter to contact pharmacist at the provider's office for assistance with medication dispenser        SDOH assessments and interventions completed:  No     Care Coordination Interventions:  Yes, provided  Interventions Today    Flowsheet Row Most Recent Value  Chronic Disease   Chronic disease during today's visit Hypertension (HTN)  [CAD, right knee pain,falls]  General Interventions   General Interventions Discussed/Reviewed General Interventions Reviewed  [follow up regarding challenges wihth medication  adherance-daughter states that she feels that aherance has improved-]  Doctor Visits Discussed/Reviewed Doctor Visits Reviewed  [3/18 labs, 3/20 AWV]  Pharmacy Interventions   Pharmacy Dicussed/Reviewed Pharmacy Topics Discussed  [daughter reports that she continues to fill patient's pill box-has also purchased an automatic pill dispenser to increse adherance-concerned that it has malfunctioned-family member will assist with programming it.]       Follow up plan: Follow up call scheduled for 02/13/24    Encounter Outcome:  Patient Visit Completed

## 2024-02-08 NOTE — Telephone Encounter (Signed)
 Copied from CRM [id number]. Topic: Clinical - Medication Question >> Feb 08, 2024  3:33 PM Gurney Maxin H wrote: Reason for CRM: Marchelle Folks calling to verify office receive fax for patient to review for patient to reduce anit anticholinergic burden for medications amitriptyline (ELAVIL) 50 MG tablet solifenacin (VESICARE) 5 MG tablet, Marchelle Folks states she will be re-faxing document to review.  Marchelle Folks [phone]

## 2024-02-08 NOTE — Patient Instructions (Signed)
 Visit Information  Thank you for taking time to visit with me today. Please don't hesitate to contact me if I can be of assistance to you.   Following are the goals we discussed today:   Goals Addressed             This Visit's Progress    Care coordination activities         EMOTIONAL / MENTAL HEALTH SUPPORT Keep all upcoming appointment discussed today Continue to take medication  prescribed by Doctor Daughter to contact pharmacist at the provider's office for assistance with medication dispenser        Our next appointment is by telephone on 02/13/24 at 4pm  Please call the care guide team at [phone] if you need to cancel or reschedule your appointment.   If you are experiencing a Mental Health or Behavioral Health Crisis or need someone to talk to, please call 911   Patient verbalizes understanding of instructions and care plan provided today and agrees to view in MyChart. Active MyChart status and patient understanding of how to access instructions and care plan via MyChart confirmed with patient.     Telephone follow up appointment with care management team member scheduled for: 02/13/24  Verna Czech, LCSW [place]  [facility], [facility] Licensed Clinical Social Worker Care Coordinator  Direct Dial: [phone]

## 2024-02-11 MED ORDER — SOLIFENACIN SUCCINATE 5 MG PO TABS: 5.0000 mg | ORAL_TABLET | Freq: Every day | ORAL | Status: DC

## 2024-02-11 NOTE — Addendum Note (Signed)
 Addended by: Joaquim Nam on: 02/11/2024 11:49 AM   Modules accepted: Orders

## 2024-02-11 NOTE — Telephone Encounter (Signed)
 I would still continue as is, given her h/o insomnia and urinary sx.  She has tolerated both and the other options for insomnia may be more likely to induce other side effects.  Thanks.

## 2024-02-13 ENCOUNTER — Ambulatory Visit: Payer: Self-pay | Admitting: *Deleted

## 2024-02-13 ENCOUNTER — Ambulatory Visit (INDEPENDENT_AMBULATORY_CARE_PROVIDER_SITE_OTHER): Payer: 59

## 2024-02-13 ENCOUNTER — Telehealth: Payer: Self-pay | Admitting: Family Medicine

## 2024-02-13 DIAGNOSIS — E538 Deficiency of other specified B group vitamins: Secondary | ICD-10-CM | POA: Diagnosis not present

## 2024-02-13 MED ORDER — CYANOCOBALAMIN 1000 MCG/ML IJ SOLN
1000.0000 ug | Freq: Once | INTRAMUSCULAR | Status: AC
Start: 2024-02-13 — End: 2024-02-13
  Administered 2024-02-13: 1000 ug via INTRAMUSCULAR

## 2024-02-13 NOTE — Telephone Encounter (Signed)
Called patient unable to leave vm

## 2024-02-13 NOTE — Patient Outreach (Signed)
 Care Coordination   Follow Up Visit Note   02/13/2024 Name: Cardelia Desirea Mizrahi MRN: [id number] DOB: 1941-07-07  Preesha Amirah Goerke is a 83 y.o. year old female who sees Joaquim Nam, MD for primary care. I engaged with Edelin Maisie Fus daughter in the providers office today.  What matters to the patients health and wellness today?  Medication adherance    Goals Addressed             This Visit's Progress    Increase medication adherance       Activities and task to complete in order to accomplish goals.   Keep all upcoming appointment discussed today Continue with compliance of taking medication prescribed by Doctor Continue to follow up with plan to utilize pill dispenser to increase adherence Please continue to consider referral to area Day Programs for socialization and/or all inclusive care once medication adherence is resolved         SDOH assessments and interventions completed:  No     Care Coordination Interventions:  Yes, provided  Interventions Today    Flowsheet Row Most Recent Value  Chronic Disease   Chronic disease during today's visit Hypertension (HTN)  [CAD, right knee pain, falls]  General Interventions   General Interventions Discussed/Reviewed General Interventions Discussed, Communication with  [follow up with patient's daughter regarding additional community resource needs. Patient's daughter continues to work on medication adehernace]  Doctor Visits Discussed/Reviewed Annual Wellness Visits  [AWV 3/20]  Communication with --  [medication dispenser company-live fine customer support to confirm proper use of medication dispenser [phone]]  Education Interventions   Education Provided Provided Education  Provided Verbal Education On Medication  [use of medication dispenser and appropriate set up to increase adherance]  Pharmacy Interventions   Pharmacy Dicussed/Reviewed Pharmacy Topics Discussed, Medication Adherence  Medication  Adherence Not taking medication  [hx of being inconsistent with medication adherance-daughter has purchased a medication dispenser to assist]  Safety Interventions   Safety Discussed/Reviewed Safety Discussed       Follow up plan: Follow up call scheduled for 02/27/24    Encounter Outcome:  Patient Visit Completed

## 2024-02-13 NOTE — Progress Notes (Signed)
 Per orders of Dr. Eustaquio Boyden, injection of B-12 given by Melina Copa in right deltoid. Patient tolerated injection well. Patient will make appointment for 1 month.

## 2024-02-13 NOTE — Telephone Encounter (Signed)
 Prescription Request  02/13/2024  LOV: 12/04/2023  What is the name of the medication or equipment?  diclofenac Sodium (VOLTAREN) 1 % GEL   Have you contacted your pharmacy to request a refill? No   Which pharmacy would you like this sent to? [id number] [place] [place] - [address] AT [facility] [address] [place] [address] Phone: [phone] Fax: [phone]    Patient notified that their request is being sent to the clinical staff for review and that they should receive a response within 2 business days.   Please advise at Mobile [phone] (mobile)

## 2024-02-13 NOTE — Patient Instructions (Signed)
 Visit Information  Thank you for taking time to visit with me today. Please don't hesitate to contact me if I can be of assistance to you.   Following are the goals we discussed today:   Goals Addressed             This Visit's Progress    Increase medication adherance       Activities and task to complete in order to accomplish goals.   Keep all upcoming appointment discussed today Continue with compliance of taking medication prescribed by Doctor Continue to follow up with plan to utilize pill dispenser to increase adherence Please continue to consider referral to area Day Programs for socialization and/or all inclusive care once medication adherence is resolved         Our next appointment is by telephone on 02/28/24 at 2pm  Please call the care guide team at [phone] if you need to cancel or reschedule your appointment.   If you are experiencing a Mental Health or Behavioral Health Crisis or need someone to talk to, please call 911   Patient verbalizes understanding of instructions and care plan provided today and agrees to view in MyChart. Active MyChart status and patient understanding of how to access instructions and care plan via MyChart confirmed with patient.     Telephone follow up appointment with care management team member scheduled for: 02/28/24  Verna Czech, LCSW [place]  [facility], [facility] Licensed Clinical Social Worker Care Coordinator  Direct Dial: [phone]

## 2024-02-13 NOTE — Telephone Encounter (Signed)
 Left voicemail for patient to return call to office.

## 2024-02-16 NOTE — Telephone Encounter (Signed)
 Patient has refills available. VM left

## 2024-02-16 NOTE — Telephone Encounter (Signed)
 Left message for [name]

## 2024-02-20 NOTE — Telephone Encounter (Signed)
 Attempted to call Marchelle Folks again to advise that we have not received document

## 2024-02-22 ENCOUNTER — Other Ambulatory Visit: Payer: Self-pay | Admitting: Family Medicine

## 2024-02-24 ENCOUNTER — Other Ambulatory Visit: Payer: Self-pay | Admitting: Family Medicine

## 2024-02-26 ENCOUNTER — Encounter: Payer: Self-pay | Admitting: *Deleted

## 2024-02-27 NOTE — Telephone Encounter (Signed)
 Closing encounter unable to reach Kelly Kennedy to advise form not received

## 2024-02-28 ENCOUNTER — Ambulatory Visit: Payer: Self-pay | Admitting: *Deleted

## 2024-02-28 NOTE — Patient Instructions (Signed)
 Visit Information  Thank you for taking time to visit with me today. Please don't hesitate to contact me if I can be of assistance to you.   Following are the goals we discussed today:   Goals Addressed             This Visit's Progress    Care coordination activities         EMOTIONAL / MENTAL HEALTH SUPPORT Keep all upcoming appointment discussed today Continue to take medication as prescribed by your Doctor Continue to utilize medication dispenser to maintain adherence Continue to utilize coping strategies discussed today to manage mood     COMPLETED: Increase medication adherance       Activities and task to complete in order to accomplish goals.   Keep all upcoming appointment discussed today Continue with compliance of taking medication prescribed by Doctor Continue to follow up with plan to utilize pill dispenser to increase adherence Please continue to consider referral to area Day Programs for socialization and/or all inclusive care once medication adherence is resolved           If you are experiencing a Mental Health or Behavioral Health Crisis or need someone to talk to, please call the Suicide and Crisis Lifeline: 988   Patient verbalizes understanding of instructions and care plan provided today and agrees to view in MyChart. Active MyChart status and patient understanding of how to access instructions and care plan via MyChart confirmed with patient.     No further follow up required: patient to contact this Child psychotherapist with any additional community resource needs.  Tillie Viverette, LCSW [place]  [facility], [facility] Licensed Clinical Social Worker Care Coordinator  Direct Dial: [phone]

## 2024-02-28 NOTE — Patient Outreach (Signed)
 Care Coordination   Follow Up Visit Note   02/28/2024 Name: Kelly Kennedy MRN: [id number] DOB: Apr 04, 1941  Kelly Kennedy is a 83 y.o. year old female who sees Joaquim Nam, MD for primary care. I spoke with  Kelly Kennedy by phone today.  What matters to the patients health and wellness today?  Patient currently adherent with medications, denies continued need for ongoing mental health support at this time.   Goals Addressed             This Visit's Progress    Care coordination activities         EMOTIONAL / MENTAL HEALTH SUPPORT Keep all upcoming appointment discussed today Continue to take medication as prescribed by your Doctor Continue to utilize medication dispenser to maintain adherence Continue to utilize coping strategies discussed today to manage mood        SDOH assessments and interventions completed:  No     Care Coordination Interventions:  Yes, provided  Interventions Today    Flowsheet Row Most Recent Value  Chronic Disease   Chronic disease during today's visit Hypertension (HTN)  [CAD, right kneww pain, falls]  General Interventions   General Interventions Discussed/Reviewed General Interventions Discussed, Doctor Visits  [patient assessed for continued community resource/mental health needs]  Doctor Visits Discussed/Reviewed PCP  [4/29  PCP for Vitamin B shot]  PCP/Specialist Visits Compliance with follow-up visit  Mental Health Interventions   Mental Health Discussed/Reviewed Mental Health Discussed, Coping Strategies, Depression  [MH needs assessed-Mood and energy has improved, denies thoughts of self harm , confirms having no additonal community resource needs at this time]  Pharmacy Interventions   Pharmacy Dicussed/Reviewed Pharmacy Topics Discussed, Medication Adherence  [medication dispenser working well, adherence has improved]  Safety Interventions   Safety Discussed/Reviewed Safety Discussed  [Pt denies thouught of  harm to self or others, encouraged to call [phone] in event of a mental health crisis]       Follow up plan: No further intervention required.   Encounter Outcome:  Patient Visit Completed

## 2024-03-04 ENCOUNTER — Other Ambulatory Visit: Payer: Self-pay | Admitting: Family Medicine

## 2024-03-04 DIAGNOSIS — Z1231 Encounter for screening mammogram for malignant neoplasm of breast: Secondary | ICD-10-CM

## 2024-03-05 ENCOUNTER — Telehealth: Payer: Self-pay | Admitting: Family Medicine

## 2024-03-05 NOTE — Telephone Encounter (Signed)
 Patient daughter has been notified that the order is placed

## 2024-03-05 NOTE — Telephone Encounter (Signed)
 Copied from CRM [id number]. Topic: Clinical - Request for Lab/Test Order >> Mar 04, 2024  3:38 PM Elle L wrote: Reason for CRM: The patient's daughter is requesting a diagnostic mammogram order for the patient to be sent to [facility].

## 2024-03-06 ENCOUNTER — Other Ambulatory Visit: Payer: Self-pay | Admitting: Family Medicine

## 2024-03-06 DIAGNOSIS — N644 Mastodynia: Secondary | ICD-10-CM

## 2024-03-18 DIAGNOSIS — H34831 Tributary (branch) retinal vein occlusion, right eye, with macular edema: Secondary | ICD-10-CM | POA: Diagnosis not present

## 2024-03-19 ENCOUNTER — Ambulatory Visit
Admission: RE | Admit: 2024-03-19 | Discharge: 2024-03-19 | Disposition: A | Discharge status: Home / Self Care | Source: Ambulatory Visit | Attending: Family Medicine | Admitting: Family Medicine

## 2024-03-19 ENCOUNTER — Other Ambulatory Visit: Payer: Self-pay | Admitting: Family Medicine

## 2024-03-19 ENCOUNTER — Ambulatory Visit

## 2024-03-19 DIAGNOSIS — Z853 Personal history of malignant neoplasm of breast: Secondary | ICD-10-CM | POA: Diagnosis not present

## 2024-03-19 DIAGNOSIS — N644 Mastodynia: Secondary | ICD-10-CM

## 2024-03-19 DIAGNOSIS — N6459 Other signs and symptoms in breast: Secondary | ICD-10-CM | POA: Diagnosis not present

## 2024-03-21 ENCOUNTER — Telehealth: Payer: Self-pay

## 2024-03-21 NOTE — Telephone Encounter (Signed)
 Completed FMLA forms received and faxed to [phone] Copy sent to scan Patients daughter will pick up a copy of the forms at the front desk per request.

## 2024-03-21 NOTE — Telephone Encounter (Signed)
 FMLA paperwork signed and completed by Dr. Vallarie Gauze. Given to Gay to send

## 2024-03-26 ENCOUNTER — Ambulatory Visit (INDEPENDENT_AMBULATORY_CARE_PROVIDER_SITE_OTHER)

## 2024-03-26 DIAGNOSIS — E538 Deficiency of other specified B group vitamins: Secondary | ICD-10-CM

## 2024-03-26 MED ORDER — CYANOCOBALAMIN 1000 MCG/ML IJ SOLN
1000.0000 ug | Freq: Once | INTRAMUSCULAR | Status: AC
  Administered 2024-03-26: 1000 ug via INTRAMUSCULAR

## 2024-03-26 NOTE — Telephone Encounter (Signed)
 [facility] picked up by family member

## 2024-03-26 NOTE — Progress Notes (Signed)
Per orders of Dr. Crawford Givens, injection of B-12 given by Melina Copa in left deltoid. Patient tolerated injection well. Patient will make appointment for 1 month.

## 2024-03-29 ENCOUNTER — Other Ambulatory Visit: Payer: Self-pay | Admitting: Cardiovascular Disease

## 2024-04-02 ENCOUNTER — Ambulatory Visit (INDEPENDENT_AMBULATORY_CARE_PROVIDER_SITE_OTHER): Admitting: Family Medicine

## 2024-04-02 ENCOUNTER — Encounter: Payer: Self-pay | Admitting: Family Medicine

## 2024-04-02 VITALS — BP 126/78 | HR 83 | Temp 98.0°F | Ht 65.0 in | Wt 170.0 lb

## 2024-04-02 DIAGNOSIS — R4586 Emotional lability: Secondary | ICD-10-CM

## 2024-04-02 DIAGNOSIS — N6459 Other signs and symptoms in breast: Secondary | ICD-10-CM | POA: Diagnosis not present

## 2024-04-02 MED ORDER — AMITRIPTYLINE HCL 50 MG PO TABS: 50.0000 mg | ORAL_TABLET | Freq: Every evening | ORAL | 1 refills | Status: DC | PRN

## 2024-04-02 NOTE — Progress Notes (Unsigned)
 She wasn't able to get amitriptyline  recently, had been off for about 2 months.  Some nights she is up later than others.  Mood d/w pt.    Discussed stressors re: living with sister.    Here today with daughter.    The L breast can feel firmer than normal some of the time.   She has L arm discomfort and knot from the L elbow up to the L arm pit, on the medial side.  This has been going on over the last few weeks.    No mass but she can occ feel the L breast "drawing up" toward the L axilla.  No axillary mass or LA.  Normal biceps and upper arm exam.  No rash Rrr Ctab   D/w pt about getting MRI done.

## 2024-04-02 NOTE — Patient Instructions (Addendum)
 If you can't get amitriptyline  filled soon, then let me know.  Let me see about getting the MRI set up and we'll go from there.  Take care.  Glad to see you.

## 2024-04-04 ENCOUNTER — Telehealth: Payer: Self-pay

## 2024-04-04 ENCOUNTER — Encounter: Payer: Self-pay | Admitting: *Deleted

## 2024-04-04 ENCOUNTER — Telehealth: Payer: Self-pay | Admitting: Family Medicine

## 2024-04-04 DIAGNOSIS — N6459 Other signs and symptoms in breast: Secondary | ICD-10-CM | POA: Insufficient documentation

## 2024-04-04 DIAGNOSIS — R4586 Emotional lability: Secondary | ICD-10-CM | POA: Insufficient documentation

## 2024-04-04 NOTE — Telephone Encounter (Signed)
 [facility] called stating they received a referral for a MRI order for L breast. Office states they cannot focus on one breast. Office states order needs to be for bilateral w/o contrast. Please advise.

## 2024-04-04 NOTE — Progress Notes (Signed)
 Complex Care Management Note Care Guide Note  04/04/2024 Name: Kelly Kennedy MRN: [id number] DOB: May 25, 1941   Complex Care Management Outreach Attempts: An unsuccessful telephone outreach was attempted today to offer the patient information about available complex care management services.  Follow Up Plan:  Additional outreach attempts will be made to offer the patient complex care management information and services.   Encounter Outcome:  No Answer  Lenton Rail , RMA     [place]  [facility], [facility] Guide  Direct Dial: [phone]  Website: [email or web address]

## 2024-04-04 NOTE — Addendum Note (Signed)
 Addended by: Donnie Galea on: 04/04/2024 11:15 AM   Modules accepted: Orders

## 2024-04-04 NOTE — Assessment & Plan Note (Signed)
 She can feel a drawing sensation in the left breast where it feels like the breast is being pulled upward toward her left shoulder.  She also has pain in the left arm.  I do not know if this is related to muscle cramping.  Discussed getting MRI done given the fact that she has unilateral symptoms.

## 2024-04-04 NOTE — Assessment & Plan Note (Signed)
 Likely affected by insomnia and discussed trying to get amitriptyline  prescription filled.  Hardcopy printed so patient/daughter can check at local pharmacies.  She contracts for safety.  Discussed routine safety items like getting guns out of the home.  Referral for social work help.  Suspect that the stressor of having her sister living with her complicates her situation.  Discussed.  She has support from her daughter.  At this point still okay for outpatient follow-up.

## 2024-04-04 NOTE — Telephone Encounter (Signed)
I put in a new order. Thanks!

## 2024-04-05 ENCOUNTER — Telehealth: Payer: Self-pay

## 2024-04-05 DIAGNOSIS — N6459 Other signs and symptoms in breast: Secondary | ICD-10-CM

## 2024-04-05 NOTE — Telephone Encounter (Signed)
 Patient is not allergic to contrast.

## 2024-04-05 NOTE — Telephone Encounter (Signed)
 Is it okay that the MRI be changed to with contrast?

## 2024-04-05 NOTE — Telephone Encounter (Signed)
 Copied from CRM [id number]. Topic: General - Other >> Apr 05, 2024  8:42 AM Annelle Kiel wrote: Reason for CRM: patient has a order for a mri to be dont without contrast the dri imaging center in [place] does not do the mri without contrast there wanting to know if the patient has a allergery to the contrast is that why the dr ordered the mri without contrast if so they would like a call back regarding the mri without contrast so they can make adjustment for the patient call back number is   [phone]

## 2024-04-07 NOTE — Telephone Encounter (Signed)
 Please contact them with the request to change this to an MRI with contrast.  If they need a new order in the EMR then please let me know.  Thanks.

## 2024-04-08 NOTE — Telephone Encounter (Signed)
 The order would need to be updated to indicate with and without contrast.

## 2024-04-09 NOTE — Progress Notes (Signed)
 Complex Care Management Note  Care Guide Note 04/09/2024 Name: Aryanne Nastasia Placido MRN: [id number] DOB: 17-Nov-1941  Charliegh Marvie Basom is a 83 y.o. year old female who sees Donnie Galea, MD for primary care. I reached out to Merin Dail Drought by phone today to offer complex care management services.  Ms. Wujek was given information about Complex Care Management services today including:   The Complex Care Management services include support from the care team which includes your Nurse Care Manager, Clinical Social Worker, or Pharmacist.  The Complex Care Management team is here to help remove barriers to the health concerns and goals most important to you. Complex Care Management services are voluntary, and the patient may decline or stop services at any time by request to their care team member.   Complex Care Management Consent Status: Patient agreed to services and verbal consent obtained.   Follow up plan:  Telephone appointment with complex care management team member scheduled for:  04/17/2024  Encounter Outcome:  Patient Scheduled  Lenton Rail , RMA     [place]  [facility], [facility] Guide  Direct Dial: [phone]  Website: [email or web address]

## 2024-04-10 NOTE — Addendum Note (Signed)
 Addended by: Donnie Galea on: 04/10/2024 01:43 PM   Modules accepted: Orders

## 2024-04-10 NOTE — Telephone Encounter (Signed)
I put in a new order. Thanks!

## 2024-04-13 ENCOUNTER — Other Ambulatory Visit: Payer: Self-pay | Admitting: Family Medicine

## 2024-04-13 ENCOUNTER — Other Ambulatory Visit: Payer: Self-pay | Admitting: Physician Assistant

## 2024-04-13 DIAGNOSIS — K219 Gastro-esophageal reflux disease without esophagitis: Secondary | ICD-10-CM

## 2024-04-16 ENCOUNTER — Other Ambulatory Visit: Payer: Self-pay | Admitting: Family Medicine

## 2024-04-16 MED ORDER — CHOLESTYRAMINE 4 G PO PACK: 4.0000 g | PACK | Freq: Three times a day (TID) | ORAL | 1 refills | Status: AC | PRN

## 2024-04-17 ENCOUNTER — Other Ambulatory Visit: Payer: Self-pay | Admitting: Licensed Clinical Social Worker

## 2024-04-17 NOTE — Patient Outreach (Signed)
 Complex Care Management   Visit Note  04/17/2024  Name:  Kelly Kennedy MRN: [id number] DOB: Feb 11, 1941  Situation: Referral received for Complex Care Management related to personal care assistance needs  I obtained verbal consent from Caregiver.  Visit completed with D Harten   on the phone  [name] needs assistance with administering medication and personal care needs. LCSW A. Naseer Hearn advised pt daughter personal care assistance is not a covered benefit under medicare. LCSW. Afigueroa offer information for [facility] due to its affordability however pt daughter declined.   Background:   Past Medical History:  Diagnosis Date   adenomatous Colon polyps    colonoscopy 8/99, 12/02, 8/06   Arthritis    Bell's palsy    Cancer (HCC)    DCIS left breast   Cataract    Chest pain    uses NTG as needed   Depression    Diabetes mellitus without complication (HCC)    Diverticulosis of colon 06/2005   Ductal carcinoma in situ (DCIS) of left breast    Family history of adverse reaction to anesthesia    " my mother didn't wake up so they had to put her on life support for about an hour or more; my sisiter has problems waking up too from anesthesia.   GERD (gastroesophageal reflux disease)    Glaucoma    H/O hiatal hernia    Headache(784.0)    Hyperglycemia    Hyperlipidemia    Hypertension    Hypothyroid    Insomnia    Irritable bowel    Menopausal symptoms    Pericarditis 1980's   Stroke [facility])    TIA (transient ischemic attack)    09/2014    Assessment: Patient Reported Symptoms:  Cognitive Cognitive Status: Unable to Assess (spk with pt daughter) Cognitive/Intellectual Conditions Management [RPT]: Not Assessed      Neurological Neurological Review of Symptoms: No symptoms reported    HEENT HEENT Symptoms Reported: No symptoms reported      Cardiovascular Cardiovascular Symptoms Reported: No symptoms reported Cardiovascular Conditions: Hypertension,  Myocardial infarction  Respiratory Respiratory Symptoms Reported: No symptoms reported    Endocrine Patient reports the following symptoms related to hypoglycemia or hyperglycemia : No symptoms reported    Gastrointestinal Gastrointestinal Symptoms Reported: No symptoms reported      Genitourinary Genitourinary Symptoms Reported: No symptoms reported    Integumentary Integumentary Symptoms Reported: No symptoms reported    Musculoskeletal Musculoskelatal Symptoms Reviewed: No symptoms reported   Falls in the past year?: Yes Number of falls in past year: 1 or less Was there an injury with Fall?: No Fall Risk Category Calculator: 1 Patient Fall Risk Level: Low Fall Risk Patient at Risk for Falls Due to: History of fall(s)  Psychosocial Psychosocial Symptoms Reported: Not assessed Additional Psychological Details: completed phone visit with pt daughter     Quality of Family Relationships: helpful Do you feel physically threatened by others?: No      04/02/2024    4:54 PM  Depression screen PHQ 2/9  Decreased Interest 1  Down, Depressed, Hopeless 3  PHQ - 2 Score 4  Altered sleeping 2  Tired, decreased energy 1  Change in appetite 2  Feeling bad or failure about yourself  2  Trouble concentrating 1  Moving slowly or fidgety/restless 1  Suicidal thoughts 1  PHQ-9 Score 14  Difficult doing work/chores Somewhat difficult    There were no vitals filed for this visit.  Medications Reviewed Today  Reviewed by Fletcher Humble, LCSW (Social Worker) on 04/17/24 at 1556  Med List Status: <None>   Medication Order Taking? Sig Documenting Provider Last Dose Status Informant  amitriptyline  (ELAVIL ) 50 MG tablet [id number] Yes Take 1-2 tablets (50-100 mg total) by mouth at bedtime as needed (dispense w/o red dye). for sleep Donnie Galea, MD Taking Active   carvedilol  (COREG ) 12.5 MG tablet [id number] Yes Take 1 tablet (12.5 mg total) by mouth 2 (two) times daily. Donnie Galea, MD Taking Active   cholestyramine  (QUESTRAN ) 4 g packet [id number] Yes Take 1 packet (4 g total) by mouth 3 (three) times daily as needed. Donnie Galea, MD Taking Active   cyanocobalamin  (,VITAMIN B-12,) 1000 MCG/ML injection [id number] Yes Inject 1 mL (1,000 mcg total) into the muscle every 30 (thirty) days. Donnie Galea, MD Taking Active Self  diclofenac  Sodium (VOLTAREN ) 1 % GEL [id number] Yes Apply 2 g topically 4 (four) times daily as needed. Donnie Galea, MD Taking Active   Evolocumab  (REPATHA  SURECLICK) 140 MG/ML Stevens Eland [id number] Yes ADMINISTER 1 ML UNDER THE SKIN EVERY 14 DAYS Croitoru, Mihai, MD Taking Active   fluocinonide  cream (LIDEX ) 0.05 % [id number]  APPLY TOPICALLY DAILY AS NEEDED. USE SPARINGLY. Donnie Galea, MD  Active   fluocinonide -emollient (LIDEX -E) 0.05 % cream [id number] Yes Apply 1 Application topically as needed. [provider] Taking Active   isosorbide  mononitrate (IMDUR ) 30 MG 24 hr tablet [id number] Yes TAKE 1 TABLET BY MOUTH EVERY DAY Croitoru, Mihai, MD Taking Active   levothyroxine  (SYNTHROID ) 75 MCG tablet [id number]  TAKE 1 TABLET BY MOUTH EVERY DAY BEFORE BREAKFAST Donnie Galea, MD  Active   LUMIGAN  0.01 % SOLN [id number] Yes Place 1 drop into both eyes at bedtime. [provider] Taking Active   memantine  (NAMENDA ) 10 MG tablet [id number] Yes Take 1 tablet (10 mg total) by mouth 2 (two) times daily. Donnie Galea, MD Taking Active   memantine  (NAMENDA ) 5 MG tablet [id number] Yes TAKE 1 TABLET BY MOUTH TWICE A DAY Donnie Galea, MD Taking Active   nitrofurantoin  (MACRODANTIN ) 100 MG capsule [id number] Yes TAKE 1 CAPSULE BY MOUTH EVERY DAY Donnie Galea, MD Taking Active   nitroGLYCERIN  (NITROSTAT ) 0.4 MG SL tablet [id number]  Place 1 tablet (0.4 mg total) under the tongue every 5 (five) minutes as needed for chest pain. Carie Charity, NP  Active   omeprazole  (PRILOSEC) 40 MG capsule [id number] Yes TAKE 1 CAPSULE (40 MG TOTAL) BY  MOUTH DAILY. Edmonia Gottron, PA-C Taking Active   solifenacin  (VESICARE ) 5 MG tablet [id number] Yes Take 1 tablet (5 mg total) by mouth daily. Donnie Galea, MD Taking Active             Recommendation:   Contact [employer] to inquire about enhanced benefits for personal care assistance needs.   Follow Up Plan:   Telephone follow up appointment date/time:  04/29/2024  Fletcher Humble MSW, LCSW Licensed Clinical Social Worker  [facility], Population Health Direct Dial: [phone]  Fax: [phone]

## 2024-04-17 NOTE — Patient Instructions (Signed)
 Visit Information  Thank you for taking time to visit with me today. Please don't hesitate to contact me if I can be of assistance to you before our next scheduled appointment.  Your next care management appointment is by telephone on 04/29/2024 at  3pm  Telephone follow up appointment date/time:  04/29/2024 at 3pm  Please call the care guide team at [phone] if you need to cancel, schedule, or reschedule an appointment.   Please call 911 if you are experiencing a Mental Health or Behavioral Health Crisis or need someone to talk to.  Fletcher Humble MSW, LCSW Licensed Clinical Social Worker  [facility], Population Health Direct Dial: [phone]  Fax: [phone]

## 2024-04-25 ENCOUNTER — Ambulatory Visit

## 2024-04-29 ENCOUNTER — Telehealth: Payer: Self-pay

## 2024-04-29 ENCOUNTER — Other Ambulatory Visit: Payer: Self-pay | Admitting: Licensed Clinical Social Worker

## 2024-04-29 NOTE — Telephone Encounter (Signed)
 Copied from CRM [id number]. Topic: General - Other >> Apr 29, 2024  9:31 AM Howard Macho wrote: Reason for CRM: patient daughter called stating that they have not heard back regarding scheduling an MRI. Patient daughter stated it has been three weeks now CB [phone]

## 2024-04-29 NOTE — Patient Instructions (Signed)

## 2024-04-29 NOTE — Telephone Encounter (Signed)
 Contacted patients daughter [name] and MRI is scheduled

## 2024-04-29 NOTE — Patient Outreach (Signed)
 Complex Care Management   Visit Note  04/29/2024  Name:  Becci Batty MRN: [id number] DOB: 03-12-1941  Situation: Referral received for Complex Care Management related to personal care assistant I obtained verbal consent from Caregiver.  Visit completed with D Kamm  on the phone  Background:   Past Medical History:  Diagnosis Date   adenomatous Colon polyps    colonoscopy 8/99, 12/02, 8/06   Arthritis    Bell's palsy    Cancer (HCC)    DCIS left breast   Cataract    Chest pain    uses NTG as needed   Depression    Diabetes mellitus without complication (HCC)    Diverticulosis of colon 06/2005   Ductal carcinoma in situ (DCIS) of left breast    Family history of adverse reaction to anesthesia    " my mother didn't wake up so they had to put her on life support for about an hour or more; my sisiter has problems waking up too from anesthesia.   GERD (gastroesophageal reflux disease)    Glaucoma    H/O hiatal hernia    Headache(784.0)    Hyperglycemia    Hyperlipidemia    Hypertension    Hypothyroid    Insomnia    Irritable bowel    Menopausal symptoms    Pericarditis 1980's   Stroke (HCC)    TIA (transient ischemic attack)    09/2014    Assessment: Patient Reported Symptoms:  Cognitive        Neurological      HEENT        Cardiovascular      Respiratory      Endocrine      Gastrointestinal        Genitourinary      Integumentary      Musculoskeletal          Psychosocial       Quality of Family Relationships: helpful Do you feel physically threatened by others?: No      04/02/2024    4:54 PM  Depression screen PHQ 2/9  Decreased Interest 1  Down, Depressed, Hopeless 3  PHQ - 2 Score 4  Altered sleeping 2  Tired, decreased energy 1  Change in appetite 2  Feeling bad or failure about yourself  2  Trouble concentrating 1  Moving slowly or fidgety/restless 1  Suicidal thoughts 1  PHQ-9 Score 14  Difficult doing work/chores  Somewhat difficult    There were no vitals filed for this visit.  Medications Reviewed Today     Reviewed by Fletcher Humble, LCSW (Social Worker) on 04/29/24 at 1615  Med List Status: <None>   Medication Order Taking? Sig Documenting Provider Last Dose Status Informant  amitriptyline  (ELAVIL ) 50 MG tablet [id number] No Take 1-2 tablets (50-100 mg total) by mouth at bedtime as needed (dispense w/o red dye). for sleep Donnie Galea, MD Taking Active   carvedilol  (COREG ) 12.5 MG tablet [id number] No Take 1 tablet (12.5 mg total) by mouth 2 (two) times daily. Donnie Galea, MD Taking Active   cholestyramine  (QUESTRAN ) 4 g packet [id number] No Take 1 packet (4 g total) by mouth 3 (three) times daily as needed. Donnie Galea, MD Taking Active   cyanocobalamin  (,VITAMIN B-12,) 1000 MCG/ML injection [id number] No Inject 1 mL (1,000 mcg total) into the muscle every 30 (thirty) days. Donnie Galea, MD Taking Active Self  diclofenac  Sodium (VOLTAREN ) 1 % GEL [id number] No Apply 2 g topically  4 (four) times daily as needed. Donnie Galea, MD Taking Active   Evolocumab  (REPATHA  SURECLICK) 140 MG/ML [place] [id number] No ADMINISTER 1 ML UNDER THE SKIN EVERY 14 DAYS Croitoru, Mihai, MD Taking Active   fluocinonide  cream (LIDEX ) 0.05 % [id number] No APPLY TOPICALLY DAILY AS NEEDED. USE SPARINGLY. Donnie Galea, MD Taking Active   fluocinonide -emollient (LIDEX -E) 0.05 % cream [id number] No Apply 1 Application topically as needed. [provider] Taking Active   isosorbide  mononitrate (IMDUR ) 30 MG 24 hr tablet [id number] No TAKE 1 TABLET BY MOUTH EVERY DAY Croitoru, Mihai, MD Taking Active   levothyroxine  (SYNTHROID ) 75 MCG tablet [id number] No TAKE 1 TABLET BY MOUTH EVERY DAY BEFORE BREAKFAST Donnie Galea, MD Taking Active   LUMIGAN  0.01 % SOLN [id number] No Place 1 drop into both eyes at bedtime. [provider] Taking Active   memantine  (NAMENDA ) 10 MG tablet [id number] No Take 1  tablet (10 mg total) by mouth 2 (two) times daily. Donnie Galea, MD Taking Active   memantine  (NAMENDA ) 5 MG tablet [id number] No TAKE 1 TABLET BY MOUTH TWICE A DAY Donnie Galea, MD Taking Active   nitrofurantoin  (MACRODANTIN ) 100 MG capsule [id number] No TAKE 1 CAPSULE BY MOUTH EVERY DAY Donnie Galea, MD Taking Active   nitroGLYCERIN  (NITROSTAT ) 0.4 MG SL tablet [id number] No Place 1 tablet (0.4 mg total) under the tongue every 5 (five) minutes as needed for chest pain. Carie Charity, NP Taking Active   omeprazole  (PRILOSEC) 40 MG capsule [id number] No TAKE 1 CAPSULE (40 MG TOTAL) BY MOUTH DAILY. Edmonia Gottron, PA-C Taking Active   solifenacin  (VESICARE ) 5 MG tablet [id number] No Take 1 tablet (5 mg total) by mouth daily. Donnie Galea, MD Taking Active             Recommendation:   Contact personal care agencies such as [facility] for rates and min hours.   Follow Up Plan:   Patient has met all care management goals. Care Management case will be closed. Patient has been provided contact information should new needs arise.   Fletcher Humble MSW, LCSW Licensed Clinical Social Worker  [facility], Population Health Direct Dial: [phone]  Fax: [phone]

## 2024-05-17 NOTE — Patient Outreach (Signed)
 Complex Care Management   Visit Note  05/17/2024  Name:  Kelly Kennedy MRN: [id number] DOB: September 01, 1941  Situation: Call to patients daughter/ dpr Alejandra Hurst to offer ongoing case management follow up with Gilberto Labella, RN.  Daughter consented to ongoing case management follow up.    Background:   Past Medical History:  Diagnosis Date   adenomatous Colon polyps    colonoscopy 8/99, 12/02, 8/06   Arthritis    Bell's palsy    Cancer (HCC)    DCIS left breast   Cataract    Chest pain    uses NTG as needed   Depression    Diabetes mellitus without complication (HCC)    Diverticulosis of colon 06/2005   Ductal carcinoma in situ (DCIS) of left breast    Family history of adverse reaction to anesthesia     my mother didn't wake up so they had to put her on life support for about an hour or more; my sisiter has problems waking up too from anesthesia.   GERD (gastroesophageal reflux disease)    Glaucoma    H/O hiatal hernia    Headache(784.0)    Hyperglycemia    Hyperlipidemia    Hypertension    Hypothyroid    Insomnia    Irritable bowel    Menopausal symptoms    Pericarditis 1980's   Stroke [facility])    TIA (transient ischemic attack)    09/2014     Follow Up Plan:   Telephone follow up appointment date/time:  06/06/24 at 3:30 pm with Gilberto Labella, RN case manager  Verba Girt RN, BSN, CCM [place]  [facility], Population Health Case Manager Phone: [phone]

## 2024-05-18 ENCOUNTER — Ambulatory Visit
Admission: RE | Admit: 2024-05-18 | Discharge: 2024-05-18 | Disposition: A | Discharge status: Home / Self Care | Source: Ambulatory Visit | Attending: Family Medicine | Admitting: Family Medicine

## 2024-05-18 DIAGNOSIS — Z853 Personal history of malignant neoplasm of breast: Secondary | ICD-10-CM | POA: Diagnosis not present

## 2024-05-18 DIAGNOSIS — N6459 Other signs and symptoms in breast: Secondary | ICD-10-CM

## 2024-05-18 DIAGNOSIS — Z1239 Encounter for other screening for malignant neoplasm of breast: Secondary | ICD-10-CM | POA: Diagnosis not present

## 2024-05-18 MED ORDER — GADOPICLENOL 0.5 MMOL/ML IV SOLN
7.0000 mL | Freq: Once | INTRAVENOUS | Status: AC | PRN
Start: 2024-05-18 — End: 2024-05-18
  Administered 2024-05-18: 7 mL via INTRAVENOUS

## 2024-05-20 ENCOUNTER — Ambulatory Visit: Payer: Self-pay | Admitting: Family Medicine

## 2024-05-20 DIAGNOSIS — H34831 Tributary (branch) retinal vein occlusion, right eye, with macular edema: Secondary | ICD-10-CM | POA: Diagnosis not present

## 2024-05-28 ENCOUNTER — Ambulatory Visit

## 2024-05-30 ENCOUNTER — Other Ambulatory Visit: Payer: Self-pay | Admitting: Family Medicine

## 2024-05-30 DIAGNOSIS — Z1231 Encounter for screening mammogram for malignant neoplasm of breast: Secondary | ICD-10-CM

## 2024-05-30 NOTE — Telephone Encounter (Signed)
 Copied from CRM [id number]. Topic: Appointments - Appointment Info/Confirmation >> May 30, 2024  3:02 PM Donna BRAVO wrote: Patient/patient representative is calling for information regarding an appointment.  Patient daughter Darleen calling asking for appt details for 06/11/24 at 4:00pm   Darleen asking if patient B12 injection can be done at this appt on 06/11/24  Please call Darleen [phone]  if this can be done

## 2024-06-06 ENCOUNTER — Other Ambulatory Visit: Payer: Self-pay

## 2024-06-06 DIAGNOSIS — F411 Generalized anxiety disorder: Secondary | ICD-10-CM

## 2024-06-06 NOTE — Patient Instructions (Signed)
 Visit Information  Thank you for taking time to visit with me today. Please don't hesitate to contact me if I can be of assistance to you before our next scheduled appointment.  Our next appointment is by telephone on 06/28/2024 at 3:30 pm. LCSW will call Kelly Kennedy to schedule. Please call the care guide team at [phone] if you need to cancel or reschedule your appointment.   Following is a copy of your care plan:   Goals Addressed             This Visit's Progress    [facility] RN Care Plan - Anxiety       Problems:  Chronic Disease Management support and education needs related to Anxiety  Goal: Over the next 90  days the Caregiver Patient will attend all scheduled medical appointments: PCP and LCSW as evidenced by no missed appointments        continue to work with RN Care Manager and/or Social Worker to address care management and care coordination needs related to Anxiety as evidenced by adherence to care management team scheduled appointments     take all medications exactly as prescribed and will call provider for medication related questions as evidenced by verbalizing taking all medications    verbalize basic understanding of Anxiety disease process and self health management plan as evidenced by recognizing increased anxiety and requesting help from support system, consider Day Program to get out of house, ensure no guns available in home, maintain Safety Contract with Dr [name], participate in visits with LCSW (referral placed)   Interventions:   Evaluation of current treatment plan related to Anxiety, Mental Health Concerns , Family and relationship dysfunction, and Memory Deficits self-management and patient's adherence to plan as established by provider. Discussed plans with patient for ongoing care management follow up and provided patient with direct contact information for care management team Provided education to patient re: Anxiety, safety, Social Work referral for  Anxiety/Relationship Stress Screening for signs and symptoms of depression related to chronic disease state  Assessed social determinant of health barriers  Patient Self-Care Activities:  Attend all scheduled provider appointments Call pharmacy for medication refills 3-7 days in advance of running out of medications Call provider office for new concerns or questions  Take medications as prescribed    Plan:  Telephone follow up appointment with care management team member scheduled for:  06/28/2024 at 3:30 pm          [facility]BC[facility] RN Care Plan - HTN       Problems:  Chronic Disease Management support and education needs related to HTN  Goal: Over the next 90  days the Caregiver Patient will attend all scheduled medical appointments: PCP  as evidenced by no missed appointments        continue to work with RN Care Manager and/or Social Worker to address care management and care coordination needs related to HTN as evidenced by adherence to care management team scheduled appointments     take all medications exactly as prescribed and will call provider for medication related questions as evidenced by verbalizing compliance with medications    verbalize basic understanding of HTN disease process and self health management plan as evidenced by checking BP daily and recording, lifestyle modifications including diet, stress reduction, exercise, reporting elevated BP to PCP and verbalizing red flags for ED  Interventions:   Hypertension Interventions: Last practice recorded BP readings:  BP Readings from Last 3 Encounters:  04/02/24 126/78  12/11/23 (!) 153/86  12/04/23 128/72   Most recent eGFR/CrCl:  Lab Results  Component Value Date   EGFR 74 06/02/2022    No components found for: CRCL  Provided education to patient re: stroke prevention, s/s of heart attack and stroke Reviewed medications with patient and discussed importance of compliance Discussed plans with patient for ongoing care  management follow up and provided patient with direct contact information for care management team Provided education on prescribed diet Low salt Screening for signs and symptoms of depression related to chronic disease state  Assessed social determinant of health barriers  Patient Self-Care Activities:  Attend all scheduled provider appointments Call pharmacy for medication refills 3-7 days in advance of running out of medications Call provider office for new concerns or questions  Take medications as prescribed   check blood pressure daily keep a blood pressure log take blood pressure log to all doctor appointments call doctor for signs and symptoms of high blood pressure keep all doctor appointments take medications for blood pressure exactly as prescribed report new symptoms to your doctor eat more whole grains, fruits and vegetables, lean meats and healthy fats limit salt intake  Plan:  Telephone follow up appointment with care management team member scheduled for:  06/28/2024 at 3:30 pm             Please call the Suicide and Crisis Lifeline: 988 call the [place]  National Suicide Prevention Lifeline: 640-306-7177 or TTY: (276) 856-9536 TTY (435) 538-9387) to talk to a trained counselor call 1-800-273-TALK (toll free, 24 hour hotline) go to [facility] [address], [place] [phone]) call 911 if you are experiencing a Mental Health or Behavioral Health Crisis or need someone to talk to.  Daughter/Patient verbalizes understanding of instructions and care plan provided today and agrees to view in MyChart. Active MyChart status and patient understanding of how to access instructions and care plan via MyChart confirmed with patient.     [name], MSN, RN [place]  [facility], [facility] RN Care Manager Direct Dial: [phone] Fax: [phone]  Hypertension, Adult High blood pressure  (hypertension) is when the force of blood pumping through the arteries is too strong. The arteries are the blood vessels that carry blood from the heart throughout the body. Hypertension forces the heart to work harder to pump blood and may cause arteries to become narrow or stiff. Untreated or uncontrolled hypertension can lead to a heart attack, heart failure, a stroke, kidney disease, and other problems. A blood pressure reading consists of a higher number over a lower number. Ideally, your blood pressure should be below 120/80. The first (top) number is called the systolic pressure. It is a measure of the pressure in your arteries as your heart beats. The second (bottom) number is called the diastolic pressure. It is a measure of the pressure in your arteries as the heart relaxes. What are the causes? The exact cause of this condition is not known. There are some conditions that result in high blood pressure. What increases the risk? Certain factors may make you more likely to develop high blood pressure. Some of these risk factors are under your control, including: Smoking. Not getting enough exercise or physical activity. Being overweight. Having too much fat, sugar, calories, or salt (sodium) in your diet. Drinking too much alcohol . Other risk factors include: Having a personal history of heart disease, diabetes, high cholesterol, or kidney disease. Stress. Having a family history of high blood pressure and high cholesterol. Having  obstructive sleep apnea. Age. The risk increases with age. What are the signs or symptoms? High blood pressure may not cause symptoms. Very high blood pressure (hypertensive crisis) may cause: Headache. Fast or irregular heartbeats (palpitations). Shortness of breath. Nosebleed. Nausea and vomiting. Vision changes. Severe chest pain, dizziness, and seizures. How is this diagnosed? This condition is diagnosed by measuring your blood pressure while you  are seated, with your arm resting on a flat surface, your legs uncrossed, and your feet flat on the floor. The cuff of the blood pressure monitor will be placed directly against the skin of your upper arm at the level of your heart. Blood pressure should be measured at least twice using the same arm. Certain conditions can cause a difference in blood pressure between your right and left arms. If you have a high blood pressure reading during one visit or you have normal blood pressure with other risk factors, you may be asked to: Return on a different day to have your blood pressure checked again. Monitor your blood pressure at home for 1 week or longer. If you are diagnosed with hypertension, you may have other blood or imaging tests to help your health care provider understand your overall risk for other conditions. How is this treated? This condition is treated by making healthy lifestyle changes, such as eating healthy foods, exercising more, and reducing your alcohol  intake. You may be referred for counseling on a healthy diet and physical activity. Your health care provider may prescribe medicine if lifestyle changes are not enough to get your blood pressure under control and if: Your systolic blood pressure is above 130. Your diastolic blood pressure is above 80. Your personal target blood pressure may vary depending on your medical conditions, your age, and other factors. Follow these instructions at home: Eating and drinking  Eat a diet that is high in fiber and potassium, and low in sodium, added sugar, and fat. An example of this eating plan is called the DASH diet. DASH stands for Dietary Approaches to Stop Hypertension. To eat this way: Eat plenty of fresh fruits and vegetables. Try to fill one half of your plate at each meal with fruits and vegetables. Eat whole grains, such as whole-wheat pasta, brown rice, or whole-grain bread. Fill about one fourth of your plate with whole  grains. Eat or drink low-fat dairy products, such as skim milk or low-fat yogurt. Avoid fatty cuts of meat, processed or cured meats, and poultry with skin. Fill about one fourth of your plate with lean proteins, such as fish, chicken without skin, beans, eggs, or tofu. Avoid pre-made and processed foods. These tend to be higher in sodium, added sugar, and fat. Reduce your daily sodium intake. Many people with hypertension should eat less than 1,500 mg of sodium a day. Do not drink alcohol  if: Your health care provider tells you not to drink. You are pregnant, may be pregnant, or are planning to become pregnant. If you drink alcohol : Limit how much you have to: 0-1 drink a day for women. 0-2 drinks a day for men. Know how much alcohol  is in your drink. In the [place] one drink equals one 12 oz bottle of beer (355 mL), one 5 oz glass of wine (148 mL), or one 1 oz glass of hard liquor (44 mL). Lifestyle  Work with your health care provider to maintain a healthy body weight or to lose weight. Ask what an ideal weight is for you. Get at least 30 minutes  of exercise that causes your heart to beat faster (aerobic exercise) most days of the week. Activities may include walking, swimming, or biking. Include exercise to strengthen your muscles (resistance exercise), such as Pilates or lifting weights, as part of your weekly exercise routine. Try to do these types of exercises for 30 minutes at least 3 days a week. Do not use any products that contain nicotine or tobacco. These products include cigarettes, chewing tobacco, and vaping devices, such as e-cigarettes. If you need help quitting, ask your health care provider. Monitor your blood pressure at home as told by your health care provider. Keep all follow-up visits. This is important. Medicines Take over-the-counter and prescription medicines only as told by your health care provider. Follow directions carefully. Blood pressure medicines must be taken  as prescribed. Do not skip doses of blood pressure medicine. Doing this puts you at risk for problems and can make the medicine less effective. Ask your health care provider about side effects or reactions to medicines that you should watch for. Contact a health care provider if you: Think you are having a reaction to a medicine you are taking. Have headaches that keep coming back (recurring). Feel dizzy. Have swelling in your ankles. Have trouble with your vision. Get help right away if you: Develop a severe headache or confusion. Have unusual weakness or numbness. Feel faint. Have severe pain in your chest or abdomen. Vomit repeatedly. Have trouble breathing. These symptoms may be an emergency. Get help right away. Call 911. Do not wait to see if the symptoms will go away. Do not drive yourself to the hospital. Summary Hypertension is when the force of blood pumping through your arteries is too strong. If this condition is not controlled, it may put you at risk for serious complications. Your personal target blood pressure may vary depending on your medical conditions, your age, and other factors. For most people, a normal blood pressure is less than 120/80. Hypertension is treated with lifestyle changes, medicines, or a combination of both. Lifestyle changes include losing weight, eating a healthy, low-sodium diet, exercising more, and limiting alcohol . This information is not intended to replace advice given to you by your health care provider. Make sure you discuss any questions you have with your health care provider. Document Revised: 09/21/2021 Document Reviewed: 09/21/2021 Elsevier Patient Education  2024 [employer].  Fall Prevention in the Home, Adult Falls can cause injuries and can happen to people of all ages. There are many things you can do to make your home safer and to help prevent falls. What actions can I take to prevent falls? General information Use good  lighting in all rooms. Make sure to: Replace any light bulbs that burn out. Turn on the lights in dark areas and use night-lights. Keep items that you use often in easy-to-reach places. Lower the shelves around your home if needed. Move furniture so that there are clear paths around it. Do not use throw rugs or other things on the floor that can make you trip. If any of your floors are uneven, fix them. Add color or contrast paint or tape to clearly mark and help you see: Grab bars or handrails. First and last steps of staircases. Where the edge of each step is. If you use a ladder or stepladder: Make sure that it is fully opened. Do not climb a closed ladder. Make sure the sides of the ladder are locked in place. Have someone hold the ladder while you use  it. Know where your pets are as you move through your home. What can I do in the bathroom?     Keep the floor dry. Clean up any water on the floor right away. Remove soap buildup in the bathtub or shower. Buildup makes bathtubs and showers slippery. Use non-skid mats or decals on the floor of the bathtub or shower. Attach bath mats securely with double-sided, non-slip rug tape. If you need to sit down in the shower, use a non-slip stool. Install grab bars by the toilet and in the bathtub and shower. Do not use towel bars as grab bars. What can I do in the bedroom? Make sure that you have a light by your bed that is easy to reach. Do not use any sheets or blankets on your bed that hang to the floor. Have a firm chair or bench with side arms that you can use for support when you get dressed. What can I do in the kitchen? Clean up any spills right away. If you need to reach something above you, use a step stool with a grab bar. Keep electrical cords out of the way. Do not use floor polish or wax that makes floors slippery. What can I do with my stairs? Do not leave anything on the stairs. Make sure that you have a light switch at  the top and the bottom of the stairs. Make sure that there are handrails on both sides of the stairs. Fix handrails that are broken or loose. Install non-slip stair treads on all your stairs if they do not have carpet. Avoid having throw rugs at the top or bottom of the stairs. Choose a carpet that does not hide the edge of the steps on the stairs. Make sure that the carpet is firmly attached to the stairs. Fix carpet that is loose or worn. What can I do on the outside of my home? Use bright outdoor lighting. Fix the edges of walkways and driveways and fix any cracks. Clear paths of anything that can make you trip, such as tools or rocks. Add color or contrast paint or tape to clearly mark and help you see anything that might make you trip as you walk through a door, such as a raised step or threshold. Trim any bushes or trees on paths to your home. Check to see if handrails are loose or broken and that both sides of all steps have handrails. Install guardrails along the edges of any raised decks and porches. Have leaves, snow, or ice cleared regularly. Use sand, salt, or ice melter on paths if you live where there is ice and snow during the winter. Clean up any spills in your garage right away. This includes grease or oil spills. What other actions can I take? Review your medicines with your doctor. Some medicines can cause dizziness or changes in blood pressure, which increase your risk of falling. Wear shoes that: Have a low heel. Do not wear high heels. Have rubber bottoms and are closed at the toe. Feel good on your feet and fit well. Use tools that help you move around if needed. These include: Canes. Walkers. Scooters. Crutches. Ask your doctor what else you can do to help prevent falls. This may include seeing a physical therapist to learn to do exercises to move better and get stronger. Where to find more information Centers for Disease Control and Prevention, STEADI:  TonerPromos.no [employer] on Aging: BaseRingTones.pl National Institute on Aging: BaseRingTones.pl Contact a doctor  if: You are afraid of falling at home. You feel weak, drowsy, or dizzy at home. You fall at home. Get help right away if you: Lose consciousness or have trouble moving after a fall. Have a fall that causes a head injury. These symptoms may be an emergency. Get help right away. Call 911. Do not wait to see if the symptoms will go away. Do not drive yourself to the hospital. This information is not intended to replace advice given to you by your health care provider. Make sure you discuss any questions you have with your health care provider. Document Revised: 07/18/2022 Document Reviewed: 07/18/2022 Elsevier Patient Education  2024 Elsevier Inc.  Managing Anxiety, Adult After being diagnosed with anxiety, you may be relieved to know why you have felt or behaved a certain way. You may also feel overwhelmed about the treatment ahead and what it will mean for your life. With care and support, you can manage your anxiety. How to manage lifestyle changes Understanding the difference between stress and anxiety Although stress can play a role in anxiety, it is not the same as anxiety. Stress is your body's reaction to life changes and events, both good and bad. Stress is often caused by something external, such as a deadline, test, or competition. It normally goes away after the event has ended and will last just a few hours. But, stress can be ongoing and can lead to more than just stress. Anxiety is caused by something internal, such as imagining a terrible outcome or worrying that something will go wrong that will greatly upset you. Anxiety often does not go away even after the event is over, and it can become a long-term (chronic) worry. Lowering stress and anxiety Talk with your health care provider or a counselor to learn more about lowering anxiety and stress. They may suggest  tension-reduction techniques, such as: Music. Spend time creating or listening to music that you enjoy and that inspires you. Mindfulness-based meditation. Practice being aware of your normal breaths while not trying to control your breathing. It can be done while sitting or walking. Centering prayer. Focus on a word, phrase, or sacred image that means something to you and brings you peace. Deep breathing. Expand your stomach and inhale slowly through your nose. Hold your breath for 3-5 seconds. Then breathe out slowly, letting your stomach muscles relax. Self-talk. Learn to notice and spot thought patterns that lead to anxiety reactions. Change those patterns to thoughts that feel peaceful. Muscle relaxation. Take time to tense muscles and then relax them. Choose a tension-reduction technique that fits your lifestyle and personality. These techniques take time and practice. Set aside 5-15 minutes a day to do them. Specialized therapists can offer counseling and training in these techniques. The training to help with anxiety may be covered by some insurance plans. Other things you can do to manage stress and anxiety include: Keeping a stress diary. This can help you learn what triggers your reaction and then learn ways to manage your response. Thinking about how you react to certain situations. You may not be able to control everything, but you can control your response. Making time for activities that help you relax and not feeling guilty about spending your time in this way. Doing visual imagery. This involves imagining or creating mental pictures to help you relax. Practicing yoga. Through yoga poses, you can lower tension and relax.  Medicines Medicines for anxiety include: Antidepressant medicines. These are usually prescribed for long-term daily control.  Anti-anxiety medicines. These may be added in severe cases, especially when panic attacks occur. When used together, medicines,  psychotherapy, and tension-reduction techniques may be the most effective treatment. Relationships Relationships can play a big part in helping you recover. Spend more time connecting with trusted friends and family members. Think about going to couples counseling if you have a partner, taking family education classes, or going to family therapy. Therapy can help you and others better understand your anxiety. How to recognize changes in your anxiety Everyone responds differently to treatment for anxiety. Recovery from anxiety happens when symptoms lessen and stop interfering with your daily life at home or work. This may mean that you will start to: Have better concentration and focus. Worry will interfere less in your daily thinking. Sleep better. Be less irritable. Have more energy. Have improved memory. Try to recognize when your condition is getting worse. Contact your provider if your symptoms interfere with home or work and you feel like your condition is not improving. Follow these instructions at home: Activity Exercise. Adults should: Exercise for at least 150 minutes each week. The exercise should increase your heart rate and make you sweat (moderate-intensity exercise). Do strengthening exercises at least twice a week. Get the right amount and quality of sleep. Most adults need 7-9 hours of sleep each night. Lifestyle  Eat a healthy diet that includes plenty of vegetables, fruits, whole grains, low-fat dairy products, and lean protein. Do not eat a lot of foods that are high in fats, added sugars, or salt (sodium). Make choices that simplify your life. Do not use any products that contain nicotine or tobacco. These products include cigarettes, chewing tobacco, and vaping devices, such as e-cigarettes. If you need help quitting, ask your provider. Avoid caffeine, alcohol , and certain over-the-counter cold medicines. These may make you feel worse. Ask your pharmacist which medicines  to avoid. General instructions Take over-the-counter and prescription medicines only as told by your provider. Keep all follow-up visits. This is to make sure you are managing your anxiety well or if you need more support. Where to find support You can get help and support from: Self-help groups. Online and [employer]. A trusted spiritual leader. Couples counseling. Family education classes. Family therapy. Where to find more information You may find that joining a support group helps you deal with your anxiety. The following sources can help you find counselors or support groups near you: Mental Health [place]: mentalhealthamerica.net Anxiety and Depression Association of [place] (ADAA): adaa.org The [employer] on Mental Illness (NAMI): nami.org Contact a health care provider if: You have a hard time staying focused or finishing tasks. You spend many hours a day feeling worried about everyday life. You are very tired because you cannot stop worrying. You start to have headaches or often feel tense. You have chronic nausea or diarrhea. Get help right away if: Your heart feels like it is racing. You have shortness of breath. You have thoughts of hurting yourself or others. Get help right away if you feel like you may hurt yourself or others, or have thoughts about taking your own life. Go to your nearest emergency room or: Call 911. Call the National Suicide Prevention Lifeline at (276)358-6142 or 988. This is open 24 hours a day. Text the Crisis Text Line at (340)394-2117. This information is not intended to replace advice given to you by your health care provider. Make sure you discuss any questions you have with your health care provider. Document Revised: 08/23/2022 Document  Reviewed: 03/07/2021 Elsevier Patient Education  2024 [employer].

## 2024-06-06 NOTE — Patient Outreach (Signed)
 Complex Care Management   Visit Note  06/06/2024  Name:  Kelly Kennedy MRN: [id number] DOB: Mar 24, 1941  Situation: Referral received for Complex Care Management related to HTN and anxiety I obtained verbal consent from Caregiver Patient.  Visit completed with patient and call to daughter Kelly Kennedy following visit to provide update  on the phone  Background:   Past Medical History:  Diagnosis Date   adenomatous Colon polyps    colonoscopy 8/99, 12/02, 8/06   Arthritis    Bell's palsy    Cancer (HCC)    DCIS left breast   Cataract    Chest pain    uses NTG as needed   Depression    Diabetes mellitus without complication (HCC)    Diverticulosis of colon 06/2005   Ductal carcinoma in situ (DCIS) of left breast    Family history of adverse reaction to anesthesia     my mother didn't wake up so they had to put her on life support for about an hour or more; my sisiter has problems waking up too from anesthesia.   GERD (gastroesophageal reflux disease)    Glaucoma    H/O hiatal hernia    Headache(784.0)    Hyperglycemia    Hyperlipidemia    Hypertension    Hypothyroid    Insomnia    Irritable bowel    Menopausal symptoms    Pericarditis 1980's   Stroke (HCC)    TIA (transient ischemic attack)    09/2014    Assessment: Patient Reported Symptoms:  Cognitive Cognitive Status: Alert and oriented to person, place, and time, Struggling with memory recall   Health Maintenance Behaviors: Annual physical exam, Healthy diet Healing Pattern: Average Health Facilitated by: Healthy diet  Neurological Neurological Review of Symptoms: No symptoms reported Neurological Management Strategies: Medication therapy, Routine screening Neurological Comment: short term memory concerns  HEENT HEENT Symptoms Reported: No symptoms reported HEENT Management Strategies: Medication therapy, Routine screening HEENT Comment: reported ears get stopped up allergies    Cardiovascular  Cardiovascular Symptoms Reported: No symptoms reported Does patient have uncontrolled Hypertension?: No Cardiovascular Comment: occasionally feels like heart is pounding, no other symptoms associated  Respiratory Respiratory Symptoms Reported: No symptoms reported Additional Respiratory Details: occasional dry cough Respiratory Management Strategies: Routine screening  Endocrine Endocrine Symptoms Reported: No symptoms reported Is patient diabetic?: Yes Is patient checking blood sugars at home?: No (only occasionally) Endocrine Comment: occasionally checks BG, discussed DM diet  Gastrointestinal Gastrointestinal Symptoms Reported: Incontinence Additional Gastrointestinal Details: leakage uses tissue as incontinence garments cause rash, discussed use of period pads/femine napkins      Genitourinary Genitourinary Symptoms Reported: No symptoms reported Additional Genitourinary Details: prior UTI resolved Macrobid     Integumentary Integumentary Symptoms Reported: No symptoms reported Skin Management Strategies: Routine screening  Musculoskeletal Musculoskelatal Symptoms Reviewed: Joint pain, Difficulty walking, Muscle pain, Unsteady gait, Weakness Additional Musculoskeletal Details: unsteady and weak at times, uses walker Musculoskeletal Management Strategies: Medication therapy, Medical device, Routine screening Falls in the past year?: No Number of falls in past year: 1 or less Was there an injury with Fall?: No Fall Risk Category Calculator: 0 Patient Fall Risk Level: Low Fall Risk Patient at Risk for Falls Due to: History of fall(s), Impaired balance/gait, Mental status change Fall risk Follow up: Falls evaluation completed, Falls prevention discussed  Psychosocial Psychosocial Symptoms Reported: Anxiety - if selected complete GAD, Anger, Depression - if selected complete PHQ 2-9, Sadness - if selected complete PHQ 2-9 Additional Psychological Details: significant stress  with sister  living with her, Behavioral Management Strategies: Medication therapy, Support system Major Change/Loss/Stressor/Fears (CP): Relationship concerns, Medical condition, self Techniques to Cope with Loss/Stress/Change: Diversional activities, Counseling Quality of Family Relationships: helpful, involved, stressful Do you feel physically threatened by others?: No      06/06/2024    3:46 PM  Depression screen PHQ 2/9  Decreased Interest 1  Down, Depressed, Hopeless 1  PHQ - 2 Score 2  Altered sleeping 1  Tired, decreased energy 1  Change in appetite 0  Feeling bad or failure about yourself  2  Trouble concentrating 1  Moving slowly or fidgety/restless 0  Suicidal thoughts 1  PHQ-9 Score 8  Difficult doing work/chores Very difficult    Vitals:    Medications Reviewed Today     Reviewed by Devra Lands, RN (Registered Nurse) on 06/06/24 at 1526  Med List Status: <None>   Medication Order Taking? Sig Documenting Provider Last Dose Status Informant  amitriptyline  (ELAVIL ) 50 MG tablet [id number] Yes Take 1-2 tablets (50-100 mg total) by mouth at bedtime as needed (dispense w/o red dye). for sleep [name] Arlyss RAMAN, MD  Active   carvedilol  (COREG ) 12.5 MG tablet [id number] Yes Take 1 tablet (12.5 mg total) by mouth 2 (two) times daily. [name] Arlyss RAMAN, MD  Active   cholestyramine  (QUESTRAN ) 4 g packet [id number] Yes Take 1 packet (4 g total) by mouth 3 (three) times daily as needed. [name] Arlyss RAMAN, MD  Active   cyanocobalamin  (,VITAMIN B-12,) 1000 MCG/ML injection [id number] Yes Inject 1 mL (1,000 mcg total) into the muscle every 30 (thirty) days. [name] Arlyss RAMAN, MD  Active Self  diclofenac  Sodium (VOLTAREN ) 1 % GEL [id number] Yes Apply 2 g topically 4 (four) times daily as needed. [name] Arlyss RAMAN, MD  Active   Evolocumab  (REPATHA  SURECLICK) 140 MG/ML EMMANUEL [id number] Yes ADMINISTER 1 ML UNDER THE SKIN EVERY 14 DAYS Croitoru, Mihai, MD  Active   fluocinonide  cream (LIDEX ) 0.05 %  [id number] Yes APPLY TOPICALLY DAILY AS NEEDED. USE SPARINGLY. [name] Arlyss RAMAN, MD  Active   fluocinonide -emollient (LIDEX -E) 0.05 % cream [id number] Yes Apply 1 Application topically as needed. [provider]  Active   isosorbide  mononitrate (IMDUR ) 30 MG 24 hr tablet [id number] Yes TAKE 1 TABLET BY MOUTH EVERY DAY Croitoru, Mihai, MD  Active   levothyroxine  (SYNTHROID ) 75 MCG tablet [id number] Yes TAKE 1 TABLET BY MOUTH EVERY DAY BEFORE BREAKFAST [name] Arlyss RAMAN, MD  Active   LUMIGAN  0.01 % SOLN [id number] Yes Place 1 drop into both eyes at bedtime. [provider]  Active   memantine  (NAMENDA ) 10 MG tablet [id number]  Take 1 tablet (10 mg total) by mouth 2 (two) times daily.  Patient not taking: Reported on 06/06/2024   [name] Arlyss RAMAN, MD  Active   memantine  (NAMENDA ) 5 MG tablet [id number] Yes TAKE 1 TABLET BY MOUTH TWICE A DAY [name] Arlyss RAMAN, MD  Active   nitrofurantoin  (MACRODANTIN ) 100 MG capsule [id number]  TAKE 1 CAPSULE BY MOUTH EVERY DAY  Patient not taking: Reported on 06/06/2024   [name] Arlyss RAMAN, MD  Active   nitroGLYCERIN  (NITROSTAT ) 0.4 MG SL tablet [id number] Yes Place 1 tablet (0.4 mg total) under the tongue every 5 (five) minutes as needed for chest pain. Emelia Josefa HERO, NP  Active   omeprazole  (PRILOSEC) 40 MG capsule [id number] Yes TAKE 1 CAPSULE (40 MG TOTAL) BY MOUTH DAILY. Craig Alan SAUNDERS, PA-C  Active   solifenacin  (VESICARE ) 5 MG tablet [id number] Yes  Take 1 tablet (5 mg total) by mouth daily. [name] Arlyss RAMAN, MD  Active             Recommendation:   PCP Follow-up Continue Current Plan of Care Engage with LCSW for counseling. Patient stated currently in counseling - daughter confirmed not in counseling. Aware LCSW will call to schedule  Follow Up Plan:   Telephone follow-up in 1 month  [name], MSN, RN [facility]  [facility], [facility] RN Care Manager Direct Dial: [phone] Fax: [phone]

## 2024-06-10 ENCOUNTER — Ambulatory Visit

## 2024-06-10 ENCOUNTER — Ambulatory Visit: Admitting: Family Medicine

## 2024-06-10 DIAGNOSIS — H34831 Tributary (branch) retinal vein occlusion, right eye, with macular edema: Secondary | ICD-10-CM | POA: Diagnosis not present

## 2024-06-10 DIAGNOSIS — Z961 Presence of intraocular lens: Secondary | ICD-10-CM | POA: Diagnosis not present

## 2024-06-10 DIAGNOSIS — H401131 Primary open-angle glaucoma, bilateral, mild stage: Secondary | ICD-10-CM | POA: Diagnosis not present

## 2024-06-10 DIAGNOSIS — H0288B Meibomian gland dysfunction left eye, upper and lower eyelids: Secondary | ICD-10-CM | POA: Diagnosis not present

## 2024-06-10 DIAGNOSIS — H0288A Meibomian gland dysfunction right eye, upper and lower eyelids: Secondary | ICD-10-CM | POA: Diagnosis not present

## 2024-06-11 ENCOUNTER — Encounter: Payer: Self-pay | Admitting: Family Medicine

## 2024-06-11 ENCOUNTER — Ambulatory Visit (INDEPENDENT_AMBULATORY_CARE_PROVIDER_SITE_OTHER): Admitting: Family Medicine

## 2024-06-11 VITALS — BP 144/84 | HR 84 | Temp 98.1°F | Ht 65.0 in | Wt 168.6 lb

## 2024-06-11 DIAGNOSIS — R413 Other amnesia: Secondary | ICD-10-CM

## 2024-06-11 DIAGNOSIS — M25569 Pain in unspecified knee: Secondary | ICD-10-CM | POA: Diagnosis not present

## 2024-06-11 DIAGNOSIS — E538 Deficiency of other specified B group vitamins: Secondary | ICD-10-CM | POA: Diagnosis not present

## 2024-06-11 DIAGNOSIS — N6459 Other signs and symptoms in breast: Secondary | ICD-10-CM | POA: Diagnosis not present

## 2024-06-11 MED ORDER — MEMANTINE HCL 10 MG PO TABS: 10.0000 mg | ORAL_TABLET | Freq: Two times a day (BID) | ORAL | Status: DC

## 2024-06-11 MED ORDER — CYANOCOBALAMIN 1000 MCG/ML IJ SOLN
1000.0000 ug | Freq: Once | INTRAMUSCULAR | Status: AC
  Administered 2024-06-11: 1000 ug via INTRAMUSCULAR

## 2024-06-11 NOTE — Progress Notes (Unsigned)
  Hello,  I spoke with Ms. Mcelhiney today and placed a referral to LCSW for anxiety/stressful family relationships. Positive PHQ-9, negative [place]. Patient has agreed to follow safety contract with Dr [name]. Daughter will recheck home for guns after my conversation with patient.  LCSW - please contact daughter Lucylle Foulkes to schedule appointment.    D/w pt about check on namenda  dose, re: 10mg  vs 5mg .  Goal for 10mg  BID.   She was worried about her cancer coming back, ie in the L breast.  She can have pain at the L side of the chest, can radiate up to the neck.     L medial knee pain.  Clemens a few weeks ago.  Affecting her gait.  D/w pt about trial of diclofenac .   Crepitus on ROM, medial > lateral joint line ttp.    B12 today.    Memory d/w pt.  Still reading her bible daily.  D/w pt about getting social interaction.  She is still at home.  Sister is still living there.  She quit cooking.  Not driving.

## 2024-06-11 NOTE — Progress Notes (Unsigned)
 Per orders of Dr. Crawford Givens, injection of B-12 given by Leonor Liv in right deltoid. Patient tolerated injection well.

## 2024-06-11 NOTE — Patient Instructions (Addendum)
 Try diclofenac  gel on your knee.   Please check your med list at home.   Update me as needed.   I check on options about the arm and chest wall pain.   Take care.  Glad to see you.

## 2024-06-12 ENCOUNTER — Telehealth: Payer: Self-pay | Admitting: Family Medicine

## 2024-06-12 ENCOUNTER — Encounter: Payer: Self-pay | Admitting: Family Medicine

## 2024-06-12 DIAGNOSIS — N6459 Other signs and symptoms in breast: Secondary | ICD-10-CM

## 2024-06-12 NOTE — Assessment & Plan Note (Signed)
 Discussed trial of diclofenac  gel topically.

## 2024-06-12 NOTE — Assessment & Plan Note (Signed)
 Continue Namenda  10 mg twice daily.  She has social work call pending.

## 2024-06-12 NOTE — Telephone Encounter (Signed)
 Please call [facility] with Dr. Vanderbilt.  I need his input and help seeing the patient.   My concerns the patient is having left-sided chest wall pain on the same side where she had a previous mastectomy.  Patient is worried about cancer recurrence.  MRI is reassuring.  IMPRESSION: 1. No MRI evidence of malignancy of either breast. 2. Stable post mastectomy changes on the left.  Given her history and for her peace of mind I would like his input.  Thanks.

## 2024-06-12 NOTE — Assessment & Plan Note (Signed)
 Continue replacement, dose given at office visit.

## 2024-06-12 NOTE — Assessment & Plan Note (Signed)
 She is worried that she has recurrence of breast cancer.  Discussed that I understand her concern and that she also did not have evidence of recurrence on MRI.  I question if she has postsurgical changes or chest wall pain that mimics her prior presentation.  See following phone note.

## 2024-06-14 NOTE — Telephone Encounter (Signed)
I put in the referral.  Thanks.  

## 2024-06-14 NOTE — Telephone Encounter (Signed)
 Spoke with [facility]. Per the information that I was given the preference is to refer the patient to Dr. Vanderbilt with the office notes and the concern. Also include MRI and they will contact the patient to be seen.

## 2024-06-20 ENCOUNTER — Telehealth: Payer: Self-pay

## 2024-06-20 ENCOUNTER — Ambulatory Visit
Admission: RE | Admit: 2024-06-20 | Discharge: 2024-06-20 | Disposition: A | Discharge status: Home / Self Care | Source: Ambulatory Visit | Attending: Family Medicine | Admitting: Family Medicine

## 2024-06-20 DIAGNOSIS — Z1231 Encounter for screening mammogram for malignant neoplasm of breast: Secondary | ICD-10-CM

## 2024-06-20 NOTE — Progress Notes (Signed)
 Complex Care Management Note  Care Guide Note 06/20/2024 Name: Kelly Kennedy MRN: [id number] DOB: [birth year]  Kelly Kennedy is a 83 y.o. year old female who sees [name] Arlyss RAMAN, MD for primary care. I reached out to Aniesa Macario Gavel by phone today to offer complex care management services.  Ms. Dickison was given information about Complex Care Management services today including:   The Complex Care Management services include support from the care team which includes your Nurse Care Manager, Clinical Social Worker, or Pharmacist.  The Complex Care Management team is here to help remove barriers to the health concerns and goals most important to you. Complex Care Management services are voluntary, and the patient may decline or stop services at any time by request to their care team member.   Complex Care Management Consent Status: Patient agreed to services and verbal consent obtained.   Follow up plan:  Telephone appointment with complex care management team member scheduled for:  07/03/24 at 4:00 p.m.   Encounter Outcome:  Patient Scheduled  [facility]  [facility], [facility] Care Management Assistant Direct Dial: [phone]  Fax: [phone]

## 2024-06-21 ENCOUNTER — Ambulatory Visit: Attending: Cardiovascular Disease | Admitting: Cardiovascular Disease

## 2024-06-21 ENCOUNTER — Encounter: Payer: Self-pay | Admitting: Cardiovascular Disease

## 2024-06-21 VITALS — BP 139/68 | HR 81 | Ht 65.0 in | Wt 170.4 lb

## 2024-06-21 DIAGNOSIS — I214 Non-ST elevation (NSTEMI) myocardial infarction: Secondary | ICD-10-CM | POA: Diagnosis not present

## 2024-06-21 DIAGNOSIS — I25118 Atherosclerotic heart disease of native coronary artery with other forms of angina pectoris: Secondary | ICD-10-CM

## 2024-06-21 DIAGNOSIS — I493 Ventricular premature depolarization: Secondary | ICD-10-CM | POA: Diagnosis not present

## 2024-06-21 DIAGNOSIS — E7800 Pure hypercholesterolemia, unspecified: Secondary | ICD-10-CM

## 2024-06-21 DIAGNOSIS — I10 Essential (primary) hypertension: Secondary | ICD-10-CM | POA: Diagnosis not present

## 2024-06-21 MED ORDER — NITROGLYCERIN 0.4 MG SL SUBL: 0.4000 mg | SUBLINGUAL_TABLET | SUBLINGUAL | 5 refills | Status: AC | PRN

## 2024-06-21 MED ORDER — METOPROLOL TARTRATE 50 MG PO TABS: 50.0000 mg | ORAL_TABLET | Freq: Once | ORAL | 0 refills | Status: DC

## 2024-06-21 MED ORDER — ISOSORBIDE MONONITRATE ER 30 MG PO TB24: 30.0000 mg | ORAL_TABLET | Freq: Every day | ORAL | 3 refills | Status: AC

## 2024-06-21 NOTE — Progress Notes (Signed)
 Cardiology Office Note:    Date:  06/23/2024   ID:  Kelly Kennedy, DOB [birth year], MRN [id number]  PCP:  [name] Arlyss RAMAN, MD  Cardiologist:  Jerel Balding, MD   Referring MD: [name] Arlyss RAMAN, MD   Chief Complaint  Patient presents with   Coronary Artery Disease     History of Present Illness:    Kelly Kennedy is a 83 y.o. female with a hx of moderate CAD, multiple coronary risk factors (hypertension, hypercholesterolemia, type 2 diabetes mellitus), palpitations due to PVCs and nonsustained atrial tachycardia, aortic atherosclerosis, previous TIA, probable previous pericarditis.   She is accompanied by her daughter today.  Her memory has been deteriorating.  She is taking memantine .  She has occasional discomfort in her left arm radiating up towards her shoulder and 1 severe also involving the left side of her chest.  She ran out of isosorbide  mononitrate for about a week and the symptoms have been more frequent during this time.  She does have some sublingual nitroglycerin  tablets at home but they are old.  She is taking carvedilol  and is on lipid-lowering therapy with Repatha .  Aspirin  is not on her list, but she is pretty sure that she is taking aspirin  daily as well.  Previous coronary CT angiogram in 2019 suggested a possible high-grade stenosis in the mid left circumflex coronary artery, but at cardiac catheterization no severe stenosis was identified.  Her most recent lipid profile showed an LDL that was almost at target at 72 and an excellent HDL at 70.  Hemoglobin A1c was markedly improved, almost normal at 5.7%.  She has normal renal function.  She does not have exertional chest pain but she is also quite sedentary.  She denies shortness of breath, orthopnea, PND, leg edema, claudication or focal neurological complaints.  She has a history of intolerance to numerous statins but had markedly improved lipid profile on Repatha .  She has a history of angioedema  with ACE inhibitors and had marked anorexia when taking amlodipine  and topiramate  (both medications were stopped).  October 2019 coronary CT angiogram that showed a high-grade stenosis in the mid left circumflex coronary artery, CT FFR of 0.75.  The CT also showed a calcium  score of 274 which places her on the 76th percentile.  Cardiac catheterization did not confirm the presence of a single discrete severe stenosis in the LCx coronary artery:  Dist Cx lesion is 45% stenosed -angiographically moderate at best disease. Does not explain CT scan. There is hyperdynamic left ventricular systolic function. The left ventricular ejection fraction is greater than 65% by visual estimate. LV end diastolic pressure is normal.   Mild to moderate disease distal dominant Circumflex.  There is no culprit lesion to explain CT scan findings. Otherwise essentially normal coronary arteries and a left dominant system. Normal LVEF with hyperdynamic function.  The patient had a brief run of PAT/SVT during injection.  She had a low risk nuclear stress test in 2015 and a normal echo in 2016. She underwent surgery for cancer of the left breast.  She did not receive radiation therapy.  She does take a proton pump inhibitor for acid reflux.  Past Medical History:  Diagnosis Date   adenomatous Colon polyps    colonoscopy 8/99, 12/02, 8/06   Arthritis    Bell's palsy    Cancer (HCC)    DCIS left breast   Cataract    Chest pain    uses NTG as needed   Depression  Diabetes mellitus without complication (HCC)    Diverticulosis of colon 06/2005   Ductal carcinoma in situ (DCIS) of left breast    Family history of adverse reaction to anesthesia     my mother didn't wake up so they had to put her on life support for about an hour or more; my sisiter has problems waking up too from anesthesia.   GERD (gastroesophageal reflux disease)    Glaucoma    H/O hiatal hernia    Headache(784.0)    Hyperglycemia     Hyperlipidemia    Hypertension    Hypothyroid    Insomnia    Irritable bowel    Menopausal symptoms    Pericarditis 1980's   Stroke [facility])    TIA (transient ischemic attack)    09/2014    Past Surgical History:  Procedure Laterality Date   APPENDECTOMY     BREAST EXCISIONAL BIOPSY Right    benign   BREAST RECONSTRUCTION WITH PLACEMENT OF TISSUE EXPANDER AND FLEX HD (ACELLULAR HYDRATED DERMIS) Left 08/06/2015   Procedure: LEFT BREAST RECONSTRUCTION WITH PLACEMENT OF SALINE IMPLANT AND ACELLULAR DURMAL MATRIX;  Surgeon: Alm Sick, MD;  Location: [facility] OR;  Service: Plastics;  Laterality: Left;   CARDIAC CATHETERIZATION     CATARACT EXTRACTION Bilateral 2016   CHOLECYSTECTOMY  1990   COLONOSCOPY     CYSTECTOMY  05/04/2008   left lower arm, benign   ECTOPIC PREGNANCY SURGERY  1971   FRACTURE SURGERY     nose   LEFT HEART CATH AND CORONARY ANGIOGRAPHY [name] 09/21/2018   Procedure: LEFT HEART CATH AND CORONARY ANGIOGRAPHY;  Surgeon: Anner Alm ORN, MD;  Location: [facility] INVASIVE CV LAB;  Service: Cardiovascular;  Laterality: [name];   LEFT HEART CATH AND CORONARY ANGIOGRAPHY [name] 11/23/2022   Procedure: LEFT HEART CATH AND CORONARY ANGIOGRAPHY;  Surgeon: Wonda Sharper, MD;  Location: [facility] INVASIVE CV LAB;  Service: Cardiovascular;  Laterality: [name];   MASTECTOMY Left 2016   NIPPLE SPARING MASTECTOMY Left 08/06/2015   Procedure: LEFT NIPPLE SPARING MASTECTOMY;  Surgeon: Debby Shipper, MD;  Location: [facility] OR;  Service: General;  Laterality: Left;   ORIF ANKLE FRACTURE Left 10/23/2013   Procedure: OPEN REDUCTION INTERNAL FIXATION (ORIF) LEFT ANKLE FRACTURE;  Surgeon: Kay Sharper Cummins, MD;  Location: [facility] OR;  Service: Orthopedics;  Laterality: Left;   PARTIAL HYSTERECTOMY     ovaries intact   PARTIAL MASTECTOMY WITH NEEDLE LOCALIZATION AND AXILLARY SENTINEL LYMPH NODE BX Left 05/06/2015   Procedure: LEFT BREAST PARTIAL MASTECTOMY WITH NEEDLE LOCALIZATION AND SENTINEL LYMPH NODE MAPPING;  Surgeon:  Debby Shipper, MD;  Location: [facility];  Service: General;  Laterality: Left;   TONSILLECTOMY     TUBAL LIGATION      Current Medications: Current Meds  Medication Sig   metoprolol  tartrate (LOPRESSOR ) 50 MG tablet Take 1 tablet (50 mg total) by mouth once for 1 dose. Take 90-120 minutes prior to scan. Hold for SBP less than 110.     Allergies:   Ace inhibitors, Angiotensin receptor blockers, Statins, Donepezil , Flonase  [fluticasone  propionate], Glipizide , Iron , Sulfa  antibiotics, Niacin, and Red dye #40 (allura red)   Family History: The patient's family history includes Arthritis in her mother; Cancer in her brother and father; Colon cancer in her father; Diabetes in her mother; Heart disease in her son; Stomach cancer in her father. There is no history of Pancreatic cancer, Breast cancer, Esophageal cancer, or Rectal cancer.  ROS:   Please see the history of present illness.  All other systems are reviewed and are negative  EKGs/Labs/Other Studies Reviewed:    The following studies were reviewed today:   EKG:    EKG Interpretation Date/Time:  Friday June 21 2024 16:37:05 EDT Ventricular Rate:  81 PR Interval:  174 QRS Duration:  88 QT Interval:  390 QTC Calculation: 453 R Axis:   41  Text Interpretation: Normal sinus rhythm Nonspecific T wave abnormality When compared with ECG of 14-Oct-2023 18:05, QT is shorter Confirmed by Danaija Eskridge [phone]) on 06/21/2024 5:03:24 PM        recent Labs: 10/14/2023: ALT 13 12/11/2023: BUN 18; Creatinine, Ser 0.96; Hemoglobin 12.4; Platelets 281; Potassium 4.2; Sodium 141  Recent Lipid Panel    Component Value Date/Time   CHOL 160 03/28/2023 1628   CHOL 121 10/30/2019 0915   TRIG 94.0 03/28/2023 1628   TRIG 203 10/03/2012 0000   HDL 69.50 03/28/2023 1628   HDL 72 10/30/2019 0915   CHOLHDL 2 03/28/2023 1628   VLDL 18.8 03/28/2023 1628   LDLCALC 72 03/28/2023 1628   LDLCALC 26 10/30/2019 0915   LDLDIRECT  154.0 10/27/2017 0756    Physical Exam:    VS:  BP 139/68   Pulse 81   Ht 5' 5 (1.651 m)   Wt 170 lb 6.4 oz (77.3 kg)   SpO2 96%   BMI 28.36 kg/m    Rechecked BP 142/80. Wt Readings from Last 3 Encounters:  06/21/24 170 lb 6.4 oz (77.3 kg)  06/11/24 168 lb 9.6 oz (76.5 kg)  04/02/24 170 lb (77.1 kg)     General: Alert, oriented x3, no distress, mildly obese Head: no evidence of trauma, PERRL, EOMI, no exophtalmos or lid lag, no myxedema, no xanthelasma; normal ears, nose and oropharynx Neck: normal jugular venous pulsations and no hepatojugular reflux; brisk carotid pulses without delay and no carotid bruits Chest: clear to auscultation, no signs of consolidation by percussion or palpation, normal fremitus, symmetrical and full respiratory excursions Cardiovascular: normal position and quality of the apical impulse, regular rhythm, normal first and second heart sounds, no murmurs, rubs or gallops Abdomen: no tenderness or distention, no masses by palpation, no abnormal pulsatility or arterial bruits, normal bowel sounds, no hepatosplenomegaly Extremities: no clubbing, cyanosis or edema; 2+ radial, ulnar and brachial pulses bilaterally; 2+ right femoral, posterior tibial and dorsalis pedis pulses; 2+ left femoral, posterior tibial and dorsalis pedis pulses; no subclavian or femoral bruits Neurological: grossly nonfocal Psych: Normal mood and affect    ASSESSMENT:    1. Coronary artery disease involving native coronary artery of native heart with other form of angina pectoris (HCC)   2. NSTEMI (non-ST elevated myocardial infarction) (HCC)   3. PVCs (premature ventricular contractions)   4. Essential hypertension   5. Pure hypercholesterolemia     PLAN:    In order of problems listed above:  CAD: Some suspicious for possible coronary vasospasm in the past.  It is hard to say whether her symptoms of chest discomfort are truly due to angina.  In the past the symptoms  responded to nitroglycerin , but it would take 20 minutes or longer for the relief to occur.  It is interesting that the symptoms appeared more frequent after she ran out of her long-acting nitrate.  I think we should repeat a coronary CT angiogram.  Continue carvedilol  and Repatha  and aspirin  81 mg daily. HTN: Borderline control.  Continue carvedilol .  Try to avoid increasing her burden of pills due to her age and worsening cognitive  issues.  Increase carvedilol  to 12.5 mg twice daily. HLP: Continue Repatha .  All lipid parameters within target range or very close to it had labs performed in April 2024.  It is time to repeat the lipid profile. DM: Markedly improved control with hemoglobin A1c almost in normal range. Cognitive deficits: Slow deterioration.  Has switched from donepezil  to memantine   Medication Adjustments/Labs and Tests Ordered: Current medicines are reviewed at length with the patient today.  Concerns regarding medicines are outlined above.  Orders Placed This Encounter  Procedures   CT CORONARY MORPH W/CTA COR W/SCORE W/CA W/CM &/OR WO/CM   Lipid panel   Basic metabolic panel with GFR   EKG 87-Ozji    Meds ordered this encounter  Medications   metoprolol  tartrate (LOPRESSOR ) 50 MG tablet    Sig: Take 1 tablet (50 mg total) by mouth once for 1 dose. Take 90-120 minutes prior to scan. Hold for SBP less than 110.    Dispense:  1 tablet    Refill:  0   isosorbide  mononitrate (IMDUR ) 30 MG 24 hr tablet    Sig: Take 1 tablet (30 mg total) by mouth daily.    Dispense:  90 tablet    Refill:  3   nitroGLYCERIN  (NITROSTAT ) 0.4 MG SL tablet    Sig: Place 1 tablet (0.4 mg total) under the tongue every 5 (five) minutes as needed for chest pain.    Dispense:  25 tablet    Refill:  5     Patient Instructions  Medication Instructions:  Your physician recommends that you continue on your current medications as directed. Please refer to the Current Medication list given to you  today.  *If you need a refill on your cardiac medications before your next appointment, please call your pharmacy*  Lab Work: Next week: fasting lipid panel, BMET  You may go to any of these [facility] locations: [facility] - [address] ([facility]) - [address] [phone] [address]. [address] (1st floor, next to pharmacy)   [place] - [address]    [place]  - [address]    [place] - [address] [employer]  - [address] [place] - [address] ([facility])  [place]   - [address]  If you have labs (blood work) drawn today and your tests are completely normal, you will receive your results only by: [employer] (if you have MyChart) OR A paper copy in the mail If you have any lab test that is abnormal or we need to change your treatment, we will call you to review the results.  Testing/Procedures: Your physician has requested that you have cardiac CT. Cardiac computed tomography (CT) is a painless test that uses an x-ray machine to take clear, detailed pictures of your heart. For further information please visit [email or web address] Please follow instruction sheet as given.  Follow-Up: At [facility], you and your health needs are our priority.  As part of our continuing mission to provide you with exceptional heart care, our providers are all part of one team.  This team includes your primary Cardiologist (physician) and Advanced Practice Providers or APPs (Physician Assistants and Nurse Practitioners) who all work together to provide you with the care you need, when you need it.  Your next appointment:  1 year(s)  Provider:   Jerel Balding, MD  We recommend signing up for the patient portal called MyChart.  Sign up information is provided on this After Visit Summary.   MyChart is used to connect with patients for Virtual Visits (Telemedicine).  Patients are able to view lab/test results, encounter notes, upcoming appointments, etc.  Non-urgent messages can be sent to your provider as well.   To learn more about what you can do with MyChart, go to [email or web address].   Other Instructions   Your cardiac CT will be scheduled at one of the below locations:   [facility] [address] [place], [place] [address] [phone]  OR   [facility] [address]  [place], [place] [address]  If scheduled at [facility], please arrive at the [facility] and Children's Entrance (Entrance C2) of [facility] 30 minutes prior to test start time. You can use the FREE valet parking offered at entrance C (encouraged to control the heart rate for the test). Proceed to the [facility] Radiology Department (first floor) to check-in and test prep.  All radiology patients and guests should use entrance C2 at [facility], accessed from [facility], even though the hospital's physical address listed is [address].  If scheduled at the Heart and Vascular Tower at [address], please enter the parking lot using the [address] entrance and use the FREE valet service at the patient drop-off area. Enter the buidling and check-in with registration on the main floor.  Please follow these instructions carefully (unless otherwise directed):  An IV will be required for this test and Nitroglycerin  will be given.   On the Night Before the Test: Be sure to Drink plenty of water. Do not consume any caffeinated/decaffeinated beverages or chocolate 12 hours prior to your test. Do not take any antihistamines 12 hours prior to your test.  On the Day of the Test: Drink plenty of water until 1 hour prior to the test. Do not eat any food 1 hour prior to test. You may take your regular  medications prior to the test.  Take metoprolol  (Lopressor ) 50 mg two hours prior to test. Patients who wear a continuous glucose monitor MUST remove the device prior to scanning. FEMALES- please wear underwire-free bra if available, avoid dresses & tight clothing      After the Test: Drink plenty of water. After receiving IV contrast, you may experience a mild flushed feeling. This is normal. On occasion, you may experience a mild rash up to 24 hours after the test. This is not dangerous. If this occurs, you can take Benadryl  25 mg, Zyrtec, Claritin, or Allegra and increase your fluid intake. (Patients taking Tikosyn should avoid Benadryl , and may take Zyrtec, Claritin, or Allegra) If you experience trouble breathing, this can be serious. If it is severe call 911 IMMEDIATELY. If it is mild, please call our office.  We will call to schedule your test 2-4 weeks out understanding that some insurance companies will need an authorization prior to the service being performed.   For more information and frequently asked questions, please visit our website : [email or web address]  For non-scheduling related questions, please contact the cardiac imaging nurse navigator should you have any questions/concerns: Cardiac Imaging Nurse Navigators Direct Office Dial: [phone]   For scheduling needs, including cancellations and rescheduling, please call [place], [phone].   Signed, Jerel Balding, MD  06/23/2024 2:11 PM  [facility]

## 2024-06-21 NOTE — Patient Instructions (Signed)
 Medication Instructions:  Your physician recommends that you continue on your current medications as directed. Please refer to the Current Medication list given to you today.  *If you need a refill on your cardiac medications before your next appointment, please call your pharmacy*  Lab Work: Next week: fasting lipid panel, BMET  You may go to any of these [facility] locations: [facility] - [address] ([facility]) - [address] [phone] N[address] (1st floor, next to pharmacy)   [place] - [address]    [place]  - [address]    [place] - [address] [employer]  - [address] [place] - [address] ([facility])  [place]   - [address]  If you have labs (blood work) drawn today and your tests are completely normal, you will receive your results only by: [employer] (if you have MyChart) OR A paper copy in the mail If you have any lab test that is abnormal or we need to change your treatment, we will call you to review the results.  Testing/Procedures: Your physician has requested that you have cardiac CT. Cardiac computed tomography (CT) is a painless test that uses an x-ray machine to take clear, detailed pictures of your heart. For further information please visit [email or web address] Please follow instruction sheet as given.  Follow-Up: At [facility], you and your health needs are our priority.  As part of our continuing mission to provide you with exceptional heart care, our providers are all part of one team.  This team includes your primary Cardiologist (physician) and Advanced Practice Providers or APPs (Physician Assistants and Nurse Practitioners) who all work together to provide you with the care you need, when you need it.  Your next appointment:   1  year(s)  Provider:   Jerel Balding, MD  We recommend signing up for the patient portal called MyChart.  Sign up information is provided on this After Visit Summary.  MyChart is used to connect with patients for Virtual Visits (Telemedicine).  Patients are able to view lab/test results, encounter notes, upcoming appointments, etc.  Non-urgent messages can be sent to your provider as well.   To learn more about what you can do with MyChart, go to [email or web address].   Other Instructions   Your cardiac CT will be scheduled at one of the below locations:   [facility] [address] [place], [place] [address] [phone]  OR   [facility] [address]  [place], [place] [address]  If scheduled at [facility], please arrive at the [facility] and Children's Entrance (Entrance C2) of [facility] 30 minutes prior to test start time. You can use the FREE valet parking offered at entrance C (encouraged to control the heart rate for the test). Proceed to the [facility] Radiology Department (first floor) to check-in and test prep.  All radiology patients and guests should use entrance C2 at [facility], accessed from [facility], even though the hospital's physical address listed is [address].  If scheduled at the Heart and Vascular Tower at [address], please enter the parking lot using the [address] entrance and use the FREE valet service at the patient drop-off area. Enter the buidling  and check-in with registration on the main floor.  Please follow these instructions carefully (unless otherwise directed):  An IV will be required for this test and Nitroglycerin  will be given.   On the Night Before the Test: Be sure to Drink plenty of water. Do not consume any caffeinated/decaffeinated beverages or chocolate 12 hours prior to your test. Do not take any antihistamines 12 hours prior  to your test.  On the Day of the Test: Drink plenty of water until 1 hour prior to the test. Do not eat any food 1 hour prior to test. You may take your regular medications prior to the test.  Take metoprolol  (Lopressor ) 50 mg two hours prior to test. Patients who wear a continuous glucose monitor MUST remove the device prior to scanning. FEMALES- please wear underwire-free bra if available, avoid dresses & tight clothing      After the Test: Drink plenty of water. After receiving IV contrast, you may experience a mild flushed feeling. This is normal. On occasion, you may experience a mild rash up to 24 hours after the test. This is not dangerous. If this occurs, you can take Benadryl  25 mg, Zyrtec, Claritin, or Allegra and increase your fluid intake. (Patients taking Tikosyn should avoid Benadryl , and may take Zyrtec, Claritin, or Allegra) If you experience trouble breathing, this can be serious. If it is severe call 911 IMMEDIATELY. If it is mild, please call our office.  We will call to schedule your test 2-4 weeks out understanding that some insurance companies will need an authorization prior to the service being performed.   For more information and frequently asked questions, please visit our website : [email or web address]  For non-scheduling related questions, please contact the cardiac imaging nurse navigator should you have any questions/concerns: Cardiac Imaging Nurse Navigators Direct Office Dial: [phone]   For scheduling needs, including cancellations and rescheduling, please call [place], [phone].

## 2024-06-23 ENCOUNTER — Encounter: Payer: Self-pay | Admitting: Cardiovascular Disease

## 2024-06-26 ENCOUNTER — Other Ambulatory Visit: Payer: Self-pay | Admitting: Family Medicine

## 2024-06-26 ENCOUNTER — Encounter (HOSPITAL_COMMUNITY): Payer: Self-pay

## 2024-06-26 ENCOUNTER — Telehealth: Payer: Self-pay | Admitting: *Deleted

## 2024-06-26 ENCOUNTER — Ambulatory Visit: Payer: Self-pay | Admitting: Family Medicine

## 2024-06-26 ENCOUNTER — Telehealth: Payer: Self-pay

## 2024-06-26 DIAGNOSIS — R928 Other abnormal and inconclusive findings on diagnostic imaging of breast: Secondary | ICD-10-CM

## 2024-06-26 NOTE — Telephone Encounter (Signed)
 Please call daughter. Patient had a mammogram done recently.  It was just resulted yesterday.  I signed off on that report today.    In the right breast, a possible asymmetry warrants further evaluation.  The follow-up imaging is about that.  This is separate from any concern on the left.

## 2024-06-26 NOTE — Telephone Encounter (Signed)
 Copied from CRM [id number]. Topic: Clinical - Medical Advice >> Jun 26, 2024 11:41 AM Chasity T wrote: Reason for CRM: Darleen daughter of patient is calling in because she just received a call [facility] wanting to schedule an appointment for patient for the right side imaging but she states her concerns are with the left and was unsure if Dr [name] made an mistake or if he seen something on both sides. They held off on scheduling the appointment until she was able to speak with someone on clarification. Please contact her back at [phone]

## 2024-06-26 NOTE — Telephone Encounter (Signed)
 Copied from CRM [id number]. Topic: Clinical - Medication Question >> Jun 26, 2024  2:21 PM Burnard DEL wrote: Reason for CRM: Patient would ike to know if provider could prescribe medication Tercioazole for a break out that she has on her private area.Patient stated that she was prescribed this cream before when she had a break out.  [id number] - [place], [place] - [address] AT [facility] [address]  Phone: [phone] Fax: [phone]   CB#:[phone]

## 2024-06-27 MED ORDER — TERCONAZOLE 0.8 % VA CREA: 1.0000 | TOPICAL_CREAM | Freq: Every day | VAGINAL | 0 refills | Status: DC

## 2024-06-27 NOTE — Telephone Encounter (Signed)
 Kelly Kennedy patient daughter on DPR has been notified

## 2024-06-27 NOTE — Addendum Note (Signed)
 Addended by: [name] ARLYSS RAMAN on: 06/27/2024 02:07 PM   Modules accepted: Orders

## 2024-06-27 NOTE — Telephone Encounter (Signed)
 Sent. Thanks.

## 2024-06-27 NOTE — Telephone Encounter (Signed)
 Reached out to patients [name] who is on the [facility] and advise to please schedule the imaging. Advise her of the results. She verbalized understanding.   Also we have been receiving paperwork from [facility] for supplies. They are not interested.

## 2024-06-28 ENCOUNTER — Other Ambulatory Visit: Payer: Self-pay

## 2024-06-28 NOTE — Patient Instructions (Signed)
 Visit Information  Thank you for taking time to visit with me today. Please don't hesitate to contact me if I can be of assistance to you before our next scheduled appointment.  Your next care management appointment is by telephone on 07/26/2024 at 1:00 pm  Telephone follow-up in 1 month  Please call the care guide team at [phone] if you need to cancel, schedule, or reschedule an appointment.   Please call the Suicide and Crisis Lifeline: 988 call the [place]  National Suicide Prevention Lifeline: (206)672-5575 or TTY: (873) 755-3364 TTY 407-326-2979) to talk to a trained counselor call 1-800-273-TALK (toll free, 24 hour hotline) go to [facility] [address], [place] [phone]) call 911 if you are experiencing a Mental Health or Behavioral Health Crisis or need someone to talk to.  [name], MSN, RN [place]  [facility], [facility] RN Care Manager Direct Dial: [phone] Fax: [phone]  Fall Prevention in the Home, Adult Falls can cause injuries and can happen to people of all ages. There are many things you can do to make your home safer and to help prevent falls. What actions can I take to prevent falls? General information Use good lighting in all rooms. Make sure to: Replace any light bulbs that burn out. Turn on the lights in dark areas and use night-lights. Keep items that you use often in easy-to-reach places. Lower the shelves around your home if needed. Move furniture so that there are clear paths around it. Do not use throw rugs or other things on the floor that can make you trip. If any of your floors are uneven, fix them. Add color or contrast paint or tape to clearly mark and help you see: Grab bars or handrails. First and last steps of staircases. Where the edge of each step is. If you use a ladder or stepladder: Make sure that it is fully opened. Do not climb a closed  ladder. Make sure the sides of the ladder are locked in place. Have someone hold the ladder while you use it. Know where your pets are as you move through your home. What can I do in the bathroom?     Keep the floor dry. Clean up any water on the floor right away. Remove soap buildup in the bathtub or shower. Buildup makes bathtubs and showers slippery. Use non-skid mats or decals on the floor of the bathtub or shower. Attach bath mats securely with double-sided, non-slip rug tape. If you need to sit down in the shower, use a non-slip stool. Install grab bars by the toilet and in the bathtub and shower. Do not use towel bars as grab bars. What can I do in the bedroom? Make sure that you have a light by your bed that is easy to reach. Do not use any sheets or blankets on your bed that hang to the floor. Have a firm chair or bench with side arms that you can use for support when you get dressed. What can I do in the kitchen? Clean up any spills right away. If you need to reach something above you, use a step stool with a grab bar. Keep electrical cords out of the way. Do not use floor polish or wax that makes floors slippery. What can I do with my stairs? Do not leave anything on the stairs. Make sure that you have a light switch at the top and the bottom of the stairs. Make sure that there are handrails  on both sides of the stairs. Fix handrails that are broken or loose. Install non-slip stair treads on all your stairs if they do not have carpet. Avoid having throw rugs at the top or bottom of the stairs. Choose a carpet that does not hide the edge of the steps on the stairs. Make sure that the carpet is firmly attached to the stairs. Fix carpet that is loose or worn. What can I do on the outside of my home? Use bright outdoor lighting. Fix the edges of walkways and driveways and fix any cracks. Clear paths of anything that can make you trip, such as tools or rocks. Add color or  contrast paint or tape to clearly mark and help you see anything that might make you trip as you walk through a door, such as a raised step or threshold. Trim any bushes or trees on paths to your home. Check to see if handrails are loose or broken and that both sides of all steps have handrails. Install guardrails along the edges of any raised decks and porches. Have leaves, snow, or ice cleared regularly. Use sand, salt, or ice melter on paths if you live where there is ice and snow during the winter. Clean up any spills in your garage right away. This includes grease or oil spills. What other actions can I take? Review your medicines with your doctor. Some medicines can cause dizziness or changes in blood pressure, which increase your risk of falling. Wear shoes that: Have a low heel. Do not wear high heels. Have rubber bottoms and are closed at the toe. Feel good on your feet and fit well. Use tools that help you move around if needed. These include: Canes. Walkers. Scooters. Crutches. Ask your doctor what else you can do to help prevent falls. This may include seeing a physical therapist to learn to do exercises to move better and get stronger. Where to find more information Centers for Disease Control and Prevention, STEADI: TonerPromos.no [employer] on Aging: BaseRingTones.pl National Institute on Aging: BaseRingTones.pl Contact a doctor if: You are afraid of falling at home. You feel weak, drowsy, or dizzy at home. You fall at home. Get help right away if you: Lose consciousness or have trouble moving after a fall. Have a fall that causes a head injury. These symptoms may be an emergency. Get help right away. Call 911. Do not wait to see if the symptoms will go away. Do not drive yourself to the hospital. This information is not intended to replace advice given to you by your health care provider. Make sure you discuss any questions you have with your health care provider. Document  Revised: 07/18/2022 Document Reviewed: 07/18/2022 Elsevier Patient Education  2024 [employer].

## 2024-06-28 NOTE — Patient Outreach (Signed)
 Complex Care Management   Visit Note  06/28/2024  Name:  Kelly Kennedy MRN: [id number] DOB: November 28, 1941  Situation: Referral received for Complex Care Management related to HTN and Anxiety I obtained verbal consent from Patient.  Visit completed with Patient and daughter Kelly  on the phone  Background:   Past Medical History:  Diagnosis Date   adenomatous Colon polyps    colonoscopy 8/99, 12/02, 8/06   Arthritis    Bell's palsy    Cancer (HCC)    DCIS left breast   Cataract    Chest pain    uses NTG as needed   Depression    Diabetes mellitus without complication (HCC)    Diverticulosis of colon 06/2005   Ductal carcinoma in situ (DCIS) of left breast    Family history of adverse reaction to anesthesia     my mother didn't wake up so they had to put her on life support for about an hour or more; my sisiter has problems waking up too from anesthesia.   GERD (gastroesophageal reflux disease)    Glaucoma    H/O hiatal hernia    Headache(784.0)    Hyperglycemia    Hyperlipidemia    Hypertension    Hypothyroid    Insomnia    Irritable bowel    Menopausal symptoms    Pericarditis 1980's   Stroke (HCC)    TIA (transient ischemic attack)    09/2014    Assessment: Patient Reported Symptoms:  Cognitive Cognitive Status: Struggling with memory recall, Requires Assistance Decision Making, Alert and oriented to person, place, and time, Normal speech and language skills Cognitive/Intellectual Conditions Management [RPT]: None reported or documented in medical history or problem list   Health Maintenance Behaviors: Annual physical exam, Healthy diet Healing Pattern: Average Health Facilitated by: Healthy diet  Neurological Neurological Review of Symptoms: No symptoms reported Neurological Management Strategies: Medication therapy, Routine screening Neurological Comment: short term memory concerns  HEENT HEENT Symptoms Reported: No symptoms reported HEENT Management  Strategies: Routine screening    Cardiovascular Cardiovascular Symptoms Reported: Swelling in legs or feet Does patient have uncontrolled Hypertension?: No Cardiovascular Management Strategies: Routine screening, Medication therapy, Diet modification Cardiovascular Comment: had swelling in legs last week, not checking withgt or BP no symptoms of HTN or heart failure  Respiratory Respiratory Symptoms Reported: Shortness of breath Additional Respiratory Details: occasional SOB unchanged for long time per patient Respiratory Management Strategies: Routine screening  Endocrine Endocrine Symptoms Reported: No symptoms reported Is patient diabetic?: Yes Is patient checking blood sugars at home?: No Endocrine Comment: not checking BG - daughter reports eats what she wants  Gastrointestinal Gastrointestinal Symptoms Reported: Other      Genitourinary Genitourinary Symptoms Reported: No symptoms reported Genitourinary Management Strategies: Medication therapy  Integumentary Integumentary Symptoms Reported: No symptoms reported Skin Management Strategies: Routine screening  Musculoskeletal Additional Musculoskeletal Details: using cane unsteady at times, fell about 2 Tennille Kennedy ago now Right knee swollen and hot, painful, daughter will call PCP for appointment Musculoskeletal Management Strategies: Routine screening, Medication therapy, Medical device Falls in the past year?: Yes Number of falls in past year: 2 or more Was there an injury with Fall?: Yes Fall Risk Category Calculator: 3 Patient Fall Risk Level: High Fall Risk Patient at Risk for Falls Due to: History of fall(s), Impaired balance/gait Fall risk Follow up: Falls evaluation completed, Falls prevention discussed  Psychosocial Psychosocial Symptoms Reported: Anxiety - if selected complete GAD Behavioral Management Strategies: Medication therapy, Support group Major Change/Loss/Stressor/Fears (CP):  Relationship concerns, Medical condition,  self Techniques to Cope with Loss/Stress/Change: Diversional activities, Counseling Quality of Family Relationships: helpful, involved, supportive Do you feel physically threatened by others?: No      06/28/2024    3:56 PM  Depression screen PHQ 2/9  Decreased Interest 2  Down, Depressed, Hopeless 3  PHQ - 2 Score 5  Altered sleeping 2  Tired, decreased energy 1  Change in appetite 0  Feeling bad or failure about yourself  3  Trouble concentrating 1  Moving slowly or fidgety/restless 1  Suicidal thoughts 1  PHQ-9 Score 14  Difficult doing work/chores Somewhat difficult    There were no vitals filed for this visit.  Medications Reviewed Today     Reviewed by Devra Lands, RN (Registered Nurse) on 06/28/24 at 1545  Med List Status: <None>   Medication Order Taking? Sig Documenting Provider Last Dose Status Informant  amitriptyline  (ELAVIL ) 50 MG tablet [id number] Yes Take 1-2 tablets (50-100 mg total) by mouth at bedtime as needed (dispense w/o red dye). for sleep [name] Arlyss RAMAN, MD  Active   carvedilol  (COREG ) 12.5 MG tablet [id number] Yes Take 1 tablet (12.5 mg total) by mouth 2 (two) times daily. [name] Arlyss RAMAN, MD  Active   cholestyramine  (QUESTRAN ) 4 g packet [id number] Yes Take 1 packet (4 g total) by mouth 3 (three) times daily as needed. [name] Arlyss RAMAN, MD  Active   cyanocobalamin  (,VITAMIN B-12,) 1000 MCG/ML injection [id number] Yes Inject 1 mL (1,000 mcg total) into the muscle every 30 (thirty) days. [name] Arlyss RAMAN, MD  Active Self  diclofenac  Sodium (VOLTAREN ) 1 % GEL [id number]  Apply 2 g topically 4 (four) times daily as needed.  Patient not taking: Reported on 06/28/2024   [name] Arlyss RAMAN, MD  Active   Evolocumab  (REPATHA  SURECLICK) 140 MG/ML EMMANUEL [id number] Yes ADMINISTER 1 ML UNDER THE SKIN EVERY 14 DAYS Croitoru, Mihai, MD  Active   isosorbide  mononitrate (IMDUR ) 30 MG 24 hr tablet [id number] Yes Take 1 tablet (30 mg total) by mouth daily. Croitoru, Mihai, MD   Active   levothyroxine  (SYNTHROID ) 75 MCG tablet [id number] Yes TAKE 1 TABLET BY MOUTH EVERY DAY BEFORE BREAKFAST [name] Arlyss RAMAN, MD  Active   LUMIGAN  0.01 % SOLN [id number] Yes Place 1 drop into both eyes at bedtime. [provider]  Active   memantine  (NAMENDA ) 10 MG tablet [id number] Yes Take 1 tablet (10 mg total) by mouth 2 (two) times daily. [name] Arlyss RAMAN, MD  Active   metoprolol  tartrate (LOPRESSOR ) 50 MG tablet [id number]  Take 1 tablet (50 mg total) by mouth once for 1 dose. Take 90-120 minutes prior to scan. Hold for SBP less than 110.  Patient not taking: Reported on 06/28/2024   Croitoru, Jerel, MD  Expired 06/21/24 2359   nitrofurantoin  (MACRODANTIN ) 100 MG capsule [id number] Yes TAKE 1 CAPSULE BY MOUTH EVERY DAY [name] Arlyss RAMAN, MD  Active   nitroGLYCERIN  (NITROSTAT ) 0.4 MG SL tablet [id number] Yes Place 1 tablet (0.4 mg total) under the tongue every 5 (five) minutes as needed for chest pain. Croitoru, Mihai, MD  Active   omeprazole  (PRILOSEC) 40 MG capsule [id number] Yes TAKE 1 CAPSULE (40 MG TOTAL) BY MOUTH DAILY. Craig Alan SAUNDERS, PA-C  Active   solifenacin  (VESICARE ) 5 MG tablet [id number] Yes Take 1 tablet (5 mg total) by mouth daily. [name] Arlyss RAMAN, MD  Active   terconazole  (TERAZOL 3 ) 0.8 % vaginal cream [id number] Yes Place 1 applicator vaginally at bedtime. [name],  Arlyss RAMAN, MD  Active             Recommendation:   PCP Follow-up Continue Current Plan of Care Call PCP for appointment regarding right knee swelling and hot  Follow Up Plan:   Telephone follow-up in 1 month  [name], MSN, RN [facility]  [facility], [facility] RN Care Manager Direct Dial: [phone] Fax: [phone]

## 2024-07-01 ENCOUNTER — Ambulatory Visit (HOSPITAL_COMMUNITY)
Admission: RE | Admit: 2024-07-01 | Discharge: 2024-07-01 | Disposition: A | Discharge status: Home / Self Care | Source: Ambulatory Visit | Attending: Cardiology | Admitting: Cardiology

## 2024-07-01 VITALS — BP 147/84 | HR 79

## 2024-07-01 DIAGNOSIS — I25118 Atherosclerotic heart disease of native coronary artery with other forms of angina pectoris: Secondary | ICD-10-CM | POA: Diagnosis not present

## 2024-07-01 DIAGNOSIS — Q391 Atresia of esophagus with tracheo-esophageal fistula: Secondary | ICD-10-CM | POA: Insufficient documentation

## 2024-07-01 DIAGNOSIS — R079 Chest pain, unspecified: Secondary | ICD-10-CM | POA: Diagnosis not present

## 2024-07-01 DIAGNOSIS — M47814 Spondylosis without myelopathy or radiculopathy, thoracic region: Secondary | ICD-10-CM | POA: Diagnosis not present

## 2024-07-01 DIAGNOSIS — R931 Abnormal findings on diagnostic imaging of heart and coronary circulation: Secondary | ICD-10-CM | POA: Diagnosis not present

## 2024-07-01 DIAGNOSIS — K449 Diaphragmatic hernia without obstruction or gangrene: Secondary | ICD-10-CM | POA: Insufficient documentation

## 2024-07-01 DIAGNOSIS — R911 Solitary pulmonary nodule: Secondary | ICD-10-CM | POA: Diagnosis not present

## 2024-07-01 DIAGNOSIS — I2510 Atherosclerotic heart disease of native coronary artery without angina pectoris: Secondary | ICD-10-CM | POA: Insufficient documentation

## 2024-07-01 DIAGNOSIS — I700 Atherosclerosis of aorta: Secondary | ICD-10-CM | POA: Diagnosis not present

## 2024-07-01 MED ORDER — IOHEXOL 350 MG/ML SOLN
100.0000 mL | Freq: Once | INTRAVENOUS | Status: AC | PRN
  Administered 2024-07-01: 100 mL via INTRAVENOUS

## 2024-07-01 MED ORDER — NITROGLYCERIN 0.4 MG SL SUBL
0.8000 mg | SUBLINGUAL_TABLET | Freq: Once | SUBLINGUAL | Status: AC
  Administered 2024-07-01: 0.8 mg via SUBLINGUAL

## 2024-07-02 ENCOUNTER — Ambulatory Visit (HOSPITAL_BASED_OUTPATIENT_CLINIC_OR_DEPARTMENT_OTHER)
Admission: RE | Admit: 2024-07-02 | Discharge: 2024-07-02 | Disposition: A | Discharge status: Home / Self Care | Source: Ambulatory Visit | Attending: Cardiology | Admitting: Cardiology

## 2024-07-02 DIAGNOSIS — M47814 Spondylosis without myelopathy or radiculopathy, thoracic region: Secondary | ICD-10-CM | POA: Diagnosis not present

## 2024-07-02 DIAGNOSIS — R931 Abnormal findings on diagnostic imaging of heart and coronary circulation: Secondary | ICD-10-CM

## 2024-07-02 DIAGNOSIS — I25118 Atherosclerotic heart disease of native coronary artery with other forms of angina pectoris: Secondary | ICD-10-CM | POA: Diagnosis not present

## 2024-07-02 DIAGNOSIS — R911 Solitary pulmonary nodule: Secondary | ICD-10-CM | POA: Diagnosis not present

## 2024-07-02 DIAGNOSIS — R079 Chest pain, unspecified: Secondary | ICD-10-CM | POA: Diagnosis not present

## 2024-07-02 DIAGNOSIS — I700 Atherosclerosis of aorta: Secondary | ICD-10-CM | POA: Diagnosis not present

## 2024-07-02 DIAGNOSIS — Q391 Atresia of esophagus with tracheo-esophageal fistula: Secondary | ICD-10-CM | POA: Diagnosis not present

## 2024-07-02 DIAGNOSIS — K449 Diaphragmatic hernia without obstruction or gangrene: Secondary | ICD-10-CM | POA: Diagnosis not present

## 2024-07-03 ENCOUNTER — Ambulatory Visit: Payer: Self-pay | Admitting: Cardiovascular Disease

## 2024-07-03 ENCOUNTER — Other Ambulatory Visit: Payer: Self-pay | Admitting: *Deleted

## 2024-07-03 DIAGNOSIS — R911 Solitary pulmonary nodule: Secondary | ICD-10-CM

## 2024-07-04 NOTE — Patient Outreach (Signed)
 Complex Care Management   Visit Note  07/04/2024  Name:  Axie Hayne MRN: [id number] DOB: 1941-04-19  Situation: Referral received for Complex Care Management related to Mental/Behavioral Health diagnosis depression and memory challenges I obtained verbal consent from Patient.  Visit completed with patient and daughter  on the phone on 07/03/24  Background:   Past Medical History:  Diagnosis Date   adenomatous Colon polyps    colonoscopy 8/99, 12/02, 8/06   Arthritis    Bell's palsy    Cancer (HCC)    DCIS left breast   Cataract    Chest pain    uses NTG as needed   Depression    Diabetes mellitus without complication (HCC)    Diverticulosis of colon 06/2005   Ductal carcinoma in situ (DCIS) of left breast    Family history of adverse reaction to anesthesia     my mother didn't wake up so they had to put her on life support for about an hour or more; my sisiter has problems waking up too from anesthesia.   GERD (gastroesophageal reflux disease)    Glaucoma    H/O hiatal hernia    Headache(784.0)    Hyperglycemia    Hyperlipidemia    Hypertension    Hypothyroid    Insomnia    Irritable bowel    Menopausal symptoms    Pericarditis 1980's   Stroke [facility])    TIA (transient ischemic attack)    09/2014    Assessment: Patient Reported Symptoms:  Cognitive Cognitive Status: Struggling with memory recall, Requires Assistance Decision Making Cognitive/Intellectual Conditions Management [RPT]: None reported or documented in medical history or problem list   Health Maintenance Behaviors: Annual physical exam Healing Pattern: Average Health Facilitated by: Healthy diet  Neurological Neurological Review of Symptoms: No symptoms reported    HEENT HEENT Symptoms Reported: No symptoms reported      Cardiovascular Cardiovascular Symptoms Reported: Swelling in legs or feet Does patient have uncontrolled Hypertension?: No Cardiovascular Management Strategies: Routine  screening, Medication therapy, Diet modification Cardiovascular Self-Management Outcome: 4 (good) Cardiovascular Comment: CT done 07/01/24 -results  Respiratory Respiratory Symptoms Reported: No symptoms reported    Endocrine Endocrine Symptoms Reported: No symptoms reported Is patient diabetic?: No Is patient checking blood sugars at home?: No Endocrine Comment: Per daughter, was a borderline diabetic but is not anymore-still on medication-repath  Gastrointestinal Additional Gastrointestinal Details: leakage, sometimes cannot make it to the bathroom in time, wears pads at times but they are irriating -breaks her out-will use toilet paper      Genitourinary Genitourinary Symptoms Reported: Frequency Additional Genitourinary Details: loose bowels frequently Genitourinary Management Strategies: Incontinence garment/pad Genitourinary Self-Management Outcome: 3 (uncertain) Genitourinary Comment: per patient, she does not eat to avoid frequent bowel movements-daugher visits daily to make sure patient eats  Integumentary      Musculoskeletal Musculoskelatal Symptoms Reviewed: Difficulty walking, Limited mobility, Unsteady gait Additional Musculoskeletal Details: uses a cane only when pain get's bad-knee is swollen, daughter will call PCP        Psychosocial Psychosocial Symptoms Reported: Anxiety - if selected complete GAD Additional Psychological Details: denies thoughts of harm to self or others today-weapons removed-agrees to contact daughter if thoughts of self harm re-surface-988 denies follow up with a therapist at this time-open to follow up call from LCSW Behavioral Management Strategies: Medication therapy Behavioral Health Self-Management Outcome: 4 (good) Behavioral Health Comment: relationship with sister, memory loss, burden Major Change/Loss/Stressor/Fears (CP): Medical condition, self, Relationship concerns Behaviors When Feeling Stressed/Fearful:  puzzles, word search, prayer.  spends time with family Techniques to [place] with Loss/Stress/Change: Diversional activities, Counseling Quality of Family Relationships: helpful, involved, supportive Do you feel physically threatened by others?: No      07/03/2024    4:36 PM  Depression screen PHQ 2/9  Decreased Interest 0  Down, Depressed, Hopeless 1  PHQ - 2 Score 1    There were no vitals filed for this visit.  Medications Reviewed Today   Medications were not reviewed in this encounter     Recommendation:   PCP Follow-up  Follow Up Plan:   Telephone follow up appointment date/time:  07/19/24  Lenn Mean, LCSW [place]  [facility], [facility] Licensed Clinical Social Worker  Direct Dial: [phone]

## 2024-07-04 NOTE — Patient Instructions (Signed)
 Visit Information  Thank you for taking time to visit with me today. Please don't hesitate to contact me if I can be of assistance to you before our next scheduled appointment.  Our next appointment is by telephone on 07/19/24 at 3pm Please call the care guide team at [phone] if you need to cancel or reschedule your appointment.   Following is a copy of your care plan:   Goals Addressed             This Visit's Progress    VBCI Social Work Care Plan-LCSW       Problems:   Cognitive Deficits-patient reports concerns with her short-term memory  CSW Clinical Goal(s):   Over the next 90 days the Patient will work with Child psychotherapist to address concerns related to challenges with her short term memory.  Interventions:  Dementia Care:   Current level of care: Home with other family or significant other(s): family member: sister  Evaluation of patient safety in current living environment and review of available resources and support   -Discussed family support and building support system : confirmed that patient's daughter assists with patient's care needs ie meals, medication reminders, and transportation to medical appointments  -Active listening / Reflection utilized -Emotional Support Provided related to current medical condition and challenges with memory -Participation in counseling encourage : patient declined ongoing mental health counseling -PHQ2/PHQ9 completed -GAD 7 completed -Problem Solving /Task Center strategies reviewed:to support memory: discussed keeping calendar as a reminder of events, prioritizing tasks and placing focus on one thing at a time -Suicidal Ideation/Homicidal Ideation assessed: patient denied thoughts of harm to self or others-patient confirmed previous thoughts of self harm-daughter removed all weapons from the home at that time however patient discussed the possibility of one additional weapon that may have been missed. Daughter informed and  states that she has removed all weapons from the home but will confirm and will remove any additional weapons found -Patient agreeable to contacting 38 and/or daughter in the event that thoughts of self-harm re-surface  Patient Goals/Self-Care Activities:  Continue taking your medication as prescribed.               Continue to consider ongoing mental health support    Plan:   Telephone follow up appointment with care management team member scheduled for:  07/19/24        Please call the Suicide and Crisis Lifeline: 988 call the [place]  National Suicide Prevention Lifeline: (314) 214-1486 or TTY: 435-752-3099 TTY 804-669-8795) to talk to a trained counselor call 1-800-273-TALK (toll free, 24 hour hotline) if you are experiencing a Mental Health or Behavioral Health Crisis or need someone to talk to.  Patient verbalizes understanding of instructions and care plan provided today and agrees to view in MyChart. Active MyChart status and patient understanding of how to access instructions and care plan via MyChart confirmed with patient.     Stefanny Pieri, LCSW [place]  [facility], [facility] Licensed Clinical Social Worker  Direct Dial: [phone]

## 2024-07-11 NOTE — Telephone Encounter (Signed)
 Spoke with patients [name] and advised of Dr. Verdene notes. She will await call to schedule.

## 2024-07-12 ENCOUNTER — Encounter

## 2024-07-12 ENCOUNTER — Other Ambulatory Visit

## 2024-07-15 ENCOUNTER — Ambulatory Visit
Admission: RE | Admit: 2024-07-15 | Discharge: 2024-07-15 | Disposition: A | Discharge status: Home / Self Care | Source: Ambulatory Visit | Attending: Family Medicine | Admitting: Family Medicine

## 2024-07-15 ENCOUNTER — Ambulatory Visit: Payer: Self-pay | Admitting: Family Medicine

## 2024-07-15 DIAGNOSIS — R928 Other abnormal and inconclusive findings on diagnostic imaging of breast: Secondary | ICD-10-CM

## 2024-07-17 ENCOUNTER — Ambulatory Visit (INDEPENDENT_AMBULATORY_CARE_PROVIDER_SITE_OTHER)

## 2024-07-17 DIAGNOSIS — E538 Deficiency of other specified B group vitamins: Secondary | ICD-10-CM | POA: Diagnosis not present

## 2024-07-17 MED ORDER — CYANOCOBALAMIN 1000 MCG/ML IJ SOLN
1000.0000 ug | Freq: Once | INTRAMUSCULAR | Status: AC
  Administered 2024-07-17: 1000 ug via INTRAMUSCULAR

## 2024-07-17 NOTE — Progress Notes (Signed)
 Per orders of Dr. [name], injection of monthly B12 1000 mcg/ml given by Clotilda SHAUNNA Pander, CMA in Left Deltoid. Patient tolerated injection well.  Sending to Dr Rilla in Dr Elfredia absence.

## 2024-07-18 ENCOUNTER — Ambulatory Visit

## 2024-07-19 ENCOUNTER — Other Ambulatory Visit: Payer: Self-pay | Admitting: *Deleted

## 2024-07-25 ENCOUNTER — Other Ambulatory Visit: Payer: Self-pay | Admitting: Family Medicine

## 2024-07-26 ENCOUNTER — Other Ambulatory Visit: Payer: Self-pay

## 2024-07-26 NOTE — Patient Instructions (Signed)
 Visit Information  Thank you for taking time to visit with me today. Please don't hesitate to contact me if I can be of assistance to you before our next scheduled appointment.  Your next care management appointment is by telephone on 08/23/2024 at 1:00 pm  Telephone follow-up in 1 month  Please call the care guide team at [phone] if you need to cancel, schedule, or reschedule an appointment.   Please call the Suicide and Crisis Lifeline: 988 call the [place]  National Suicide Prevention Lifeline: 681 514 9528 or TTY: 772-603-4029 TTY (289)830-9075) to talk to a trained counselor call 1-800-273-TALK (toll free, 24 hour hotline) go to [facility] [address] [place] [phone]) call 911 if you are experiencing a Mental Health or Behavioral Health Crisis or need someone to talk to.  [name], MSN, RN [facility], [facility] RN Care Manager Direct Dial: [phone] Fax: [phone]

## 2024-07-26 NOTE — Patient Outreach (Signed)
 Complex Care Management   Visit Note  07/26/2024  Name:  Kelly Kennedy MRN: [id number] DOB: [birth year]  Situation: Referral received for Complex Care Management related to Anxiety and HTN I obtained verbal consent from Patient.  Visit completed with Caregiver Patient  on the phone  Background:   Past Medical History:  Diagnosis Date   adenomatous Colon polyps    colonoscopy 8/99, 12/02, 8/06   Arthritis    Bell's palsy    Cancer (HCC)    DCIS left breast   Cataract    Chest pain    uses NTG as needed   Depression    Diabetes mellitus without complication (HCC)    Diverticulosis of colon 06/2005   Ductal carcinoma in situ (DCIS) of left breast    Family history of adverse reaction to anesthesia     my mother didn't wake up so they had to put her on life support for about an hour or more; my sisiter has problems waking up too from anesthesia.   GERD (gastroesophageal reflux disease)    Glaucoma    H/O hiatal hernia    Headache(784.0)    Hyperglycemia    Hyperlipidemia    Hypertension    Hypothyroid    Insomnia    Irritable bowel    Menopausal symptoms    Pericarditis 1980's   Stroke [facility])    TIA (transient ischemic attack)    09/2014    Assessment: Patient Reported Symptoms:  Cognitive Cognitive Status: Struggling with memory recall, Requires Assistance Decision Making, Alert and oriented to person, place, and time, Normal speech and language skills Cognitive/Intellectual Conditions Management [RPT]: None reported or documented in medical history or problem list   Health Maintenance Behaviors: Annual physical exam Healing Pattern: Average Health Facilitated by: Healthy diet, Stress management  Neurological Neurological Review of Symptoms: No symptoms reported    HEENT HEENT Symptoms Reported: Not assessed      Cardiovascular Cardiovascular Symptoms Reported: Swelling in legs or feet Cardiovascular Management Strategies: Medication therapy, Routine  screening Cardiovascular Comment: swelling in legs and feet - elevates, discussed low salt diet to prevent swelling  Respiratory Respiratory Symptoms Reported: No symptoms reported Respiratory Management Strategies: Routine screening  Endocrine Endocrine Symptoms Reported: No symptoms reported Is patient diabetic?: No    Gastrointestinal Gastrointestinal Symptoms Reported: Incontinence Gastrointestinal Management Strategies: Incontinence garment/pad    Genitourinary Genitourinary Symptoms Reported: Incontinence Genitourinary Management Strategies: Incontinence garment/pad  Integumentary Integumentary Symptoms Reported: No symptoms reported Skin Management Strategies: Routine screening  Musculoskeletal Musculoskelatal Symptoms Reviewed: Joint pain, Muscle pain, Unsteady gait Additional Musculoskeletal Details: cane prn encouraged to use cane when feeling unsteady - weak at times Musculoskeletal Management Strategies: Routine screening, Medical device Falls in the past year?: Yes Number of falls in past year: 2 or more Was there an injury with Fall?: Yes Fall Risk Category Calculator: 3 Patient Fall Risk Level: High Fall Risk Patient at Risk for Falls Due to: History of fall(s), Impaired balance/gait Fall risk Follow up: Falls evaluation completed, Falls prevention discussed  Psychosocial Psychosocial Symptoms Reported: Irritability, Anxiety - if selected complete GAD Additional Psychological Details: no thought of wanting to hurt self - met with LCSW , states helpful Behavioral Management Strategies: Medication therapy, Counseling Major Change/Loss/Stressor/Fears (CP): Medical condition, self, Relationship concerns Techniques to Cope with Loss/Stress/Change: Diversional activities Quality of Family Relationships: helpful, involved, supportive Do you feel physically threatened by others?: No    07/26/2024    PHQ2-9 Depression Screening   Little interest or pleasure  in doing things  Several days  Feeling down, depressed, or hopeless Nearly every day  PHQ-2 - Total Score 4  Trouble falling or staying asleep, or sleeping too much More than half the days  Feeling tired or having little energy Several days  Poor appetite or overeating  Not at all  Feeling bad about yourself - or that you are a failure or have let yourself or your family down Several days  Trouble concentrating on things, such as reading the newspaper or watching television Several days  Moving or speaking so slowly that other people could have noticed.  Or the opposite - being so fidgety or restless that you have been moving around a lot more than usual Several days  Thoughts that you would be better off dead, or hurting yourself in some way Not at all  PHQ2-9 Total Score 10  If you checked off any problems, how difficult have these problems made it for you to do your work, take care of things at home, or get along with other people Somewhat difficult  Depression Interventions/Treatment Counseling    There were no vitals filed for this visit.  Medications Reviewed Today     Reviewed by Devra Lands, RN (Registered Nurse) on 07/26/24 at 1316  Med List Status: <None>   Medication Order Taking? Sig Documenting Provider Last Dose Status Informant  amitriptyline  (ELAVIL ) 50 MG tablet [id number] Yes Take 1-2 tablets (50-100 mg total) by mouth at bedtime as needed (dispense w/o red dye). for sleep [name] Arlyss RAMAN, MD  Active   carvedilol  (COREG ) 12.5 MG tablet [id number] Yes Take 1 tablet (12.5 mg total) by mouth 2 (two) times daily. [name] Arlyss RAMAN, MD  Active   cholestyramine  (QUESTRAN ) 4 g packet [id number] Yes Take 1 packet (4 g total) by mouth 3 (three) times daily as needed. [name] Arlyss RAMAN, MD  Active   cyanocobalamin  (,VITAMIN B-12,) 1000 MCG/ML injection [id number] Yes Inject 1 mL (1,000 mcg total) into the muscle every 30 (thirty) days. [name] Arlyss RAMAN, MD  Active Self  diclofenac  Sodium (VOLTAREN )  1 % GEL [id number] Yes Apply 2 g topically 4 (four) times daily as needed. [name] Arlyss RAMAN, MD  Active   Evolocumab  (REPATHA  SURECLICK) 140 MG/ML EMMANUEL [id number] Yes ADMINISTER 1 ML UNDER THE SKIN EVERY 14 DAYS Croitoru, Mihai, MD  Active   isosorbide  mononitrate (IMDUR ) 30 MG 24 hr tablet [id number] Yes Take 1 tablet (30 mg total) by mouth daily. Croitoru, Mihai, MD  Active   levothyroxine  (SYNTHROID ) 75 MCG tablet [id number] Yes TAKE 1 TABLET BY MOUTH EVERY DAY BEFORE BREAKFAST [name] Arlyss RAMAN, MD  Active   LUMIGAN  0.01 % SOLN [id number] Yes Place 1 drop into both eyes at bedtime. [provider]  Active   memantine  (NAMENDA ) 10 MG tablet [id number]  TAKE 1 TABLET BY MOUTH TWICE A DAY [name] Arlyss RAMAN, MD  Active   metoprolol  tartrate (LOPRESSOR ) 50 MG tablet [id number]  Take 1 tablet (50 mg total) by mouth once for 1 dose. Take 90-120 minutes prior to scan. Hold for SBP less than 110.  Patient not taking: Reported on 07/26/2024   Francyne Headland, MD  Expired 06/21/24 2359   nitrofurantoin  (MACRODANTIN ) 100 MG capsule [id number] Yes TAKE 1 CAPSULE BY MOUTH EVERY DAY [name] Arlyss RAMAN, MD  Active   nitroGLYCERIN  (NITROSTAT ) 0.4 MG SL tablet [id number] Yes Place 1 tablet (0.4 mg total) under the tongue every 5 (five) minutes as needed for chest pain. Croitoru, Mihai, MD  Active   omeprazole  (PRILOSEC) 40 MG capsule [id number] Yes TAKE 1 CAPSULE (40 MG TOTAL) BY MOUTH DAILY. Craig Alan SAUNDERS, PA-C  Active   solifenacin  (VESICARE ) 5 MG tablet [id number] Yes Take 1 tablet (5 mg total) by mouth daily. [name] Arlyss RAMAN, MD  Active   terconazole  (TERAZOL 3 ) 0.8 % vaginal cream [id number] Yes Place 1 applicator vaginally at bedtime. [name] Arlyss RAMAN, MD  Active             Recommendation:   PCP Follow-up Continue Current Plan of Care  Follow Up Plan:   Telephone follow-up in 1 month  [name], MSN, RN [facility]  [facility], [facility] RN Care Manager Direct  Dial: [phone] Fax: [phone]

## 2024-07-29 ENCOUNTER — Other Ambulatory Visit: Payer: Self-pay | Admitting: Family Medicine

## 2024-08-06 ENCOUNTER — Ambulatory Visit: Admitting: Family Medicine

## 2024-08-12 ENCOUNTER — Encounter: Payer: Self-pay | Admitting: *Deleted

## 2024-08-12 ENCOUNTER — Telehealth: Payer: Self-pay | Admitting: *Deleted

## 2024-08-12 NOTE — Patient Instructions (Signed)
.  Kelly Kennedy - I am sorry I was unable to reach you today for our scheduled appointment. I work with [name] Arlyss RAMAN, MD and am calling to support your healthcare needs. Please contact me at [phone] at your earliest convenience. I look forward to speaking with you soon.   Thank you,     Jalayne Ganesh, LCSW [place]  [facility], [facility] Licensed Clinical Social Worker  Direct Dial: [phone]

## 2024-08-20 ENCOUNTER — Ambulatory Visit (INDEPENDENT_AMBULATORY_CARE_PROVIDER_SITE_OTHER)

## 2024-08-20 DIAGNOSIS — E538 Deficiency of other specified B group vitamins: Secondary | ICD-10-CM | POA: Diagnosis not present

## 2024-08-20 MED ORDER — CYANOCOBALAMIN 1000 MCG/ML IJ SOLN
1000.0000 ug | Freq: Once | INTRAMUSCULAR | Status: AC
  Administered 2024-08-20: 1000 ug via INTRAMUSCULAR

## 2024-08-20 NOTE — Progress Notes (Signed)
 Per orders of Dr. Arlyss Solian, injection of B-12 given by Nellie Hummer in right deltoid. Patient tolerated injection well.

## 2024-08-23 ENCOUNTER — Other Ambulatory Visit: Payer: Self-pay

## 2024-08-23 NOTE — Patient Outreach (Signed)
 RNCM spoke with daughter Melana Hingle following patient call. Discussed concerns about memory worsening. Per daughter some things patient sharing were not recent. Declined [facility] referral for pill packs, states patient not missing meds as much as the said. Made aware of RNCM follow up, LCSW follow up, CT chest appointment and General Surgery appointment. Advised and agreed to make follo wup appointment with PCP (none scheduled). Aware to call if issues.

## 2024-08-23 NOTE — Patient Instructions (Signed)
 Visit Information  Thank you for taking time to visit with me today. Please don't hesitate to contact me if I can be of assistance to you before our next scheduled appointment.  Your next care management appointment is by telephone on 09/13/2024 at 1:30 pm  Telephone follow-up in 1 month  Please call the care guide team at [phone] if you need to cancel, schedule, or reschedule an appointment.   Please call the Suicide and Crisis Lifeline: 988 call the [place]  National Suicide Prevention Lifeline: 905-077-0438 or TTY: 930 456 0241 TTY 2232671146) to talk to a trained counselor call 1-800-273-TALK (toll free, 24 hour hotline) go to [facility] [address], [place] [phone]) call 911 if you are experiencing a Mental Health or Behavioral Health Crisis or need someone to talk to.  [name], MSN, RN [place]  [facility], [facility] RN Care Manager Direct Dial: [phone] Fax: [phone]  Management of Memory Problems  There are some general things you can do to help manage your memory problems.  Your memory may not in fact recover, but by using techniques and strategies you will be able to manage your memory difficulties better.  1)  Establish a routine. Try to establish and then stick to a regular routine.  By doing this, you will get used to what to expect and you will reduce the need to rely on your memory.  Also, try to do things at the same time of day, such as taking your medication or checking your calendar first thing in the morning. Think about think that you can do as a part of a regular routine and make a list.  Then enter them into a daily planner to remind you.  This will help you establish a routine.  2)  Organize your environment. Organize your environment so that it is uncluttered.  Decrease visual stimulation.  Place everyday items such as keys or cell phone in the same place every  day (ie.  Basket next to front door) Use post it notes with a brief message to yourself (ie. Turn off light, lock the door) Use labels to indicate where things go (ie. Which cupboards are for food, dishes, etc.) Keep a notepad and pen by the telephone to take messages  3)  Memory Aids A diary or journal/notebook/daily planner Making a list (shopping list, chore list, to do list that needs to be done) Using an alarm as a reminder (kitchen timer or cell phone alarm) Using cell phone to store information (Notes, Calendar, Reminders) Calendar/White board placed in a prominent position Post-it notes  In order for memory aids to be useful, you need to have good habits.  It's no good remembering to make a note in your journal if you don't remember to look in it.  Try setting aside a certain time of day to look in journal.  4)  Improving mood and managing fatigue. There may be other factors that contribute to memory difficulties.  Factors, such as anxiety, depression and tiredness can affect memory. Regular gentle exercise can help improve your mood and give you more energy. Simple relaxation techniques may help relieve symptoms of anxiety Try to get back to completing activities or hobbies you enjoyed doing in the past. Learn to pace yourself through activities to decrease fatigue. Find out about some local support groups where you can share experiences with others. Try and achieve 7-8 hours of sleep at night.   Memory Compensation Strategies  Use WARM  strategy.  W= write it down  A= associate it  R= repeat it  M= make a mental note  2.   You can keep a Glass blower/designer.  Use a 3-ring notebook with sections for the following: calendar, important names and phone numbers,  medications, doctors' names/phone numbers, lists/reminders, and a section to journal what you did  each day.   3.    Use a calendar to write appointments down.  4.    Write yourself a schedule for the day.  This can  be placed on the calendar or in a separate section of the Memory Notebook.  Keeping a  regular schedule can help memory.  5.    Use medication organizer with sections for each day or morning/evening pills.  You may need help loading it  6.    Keep a basket, or pegboard by the door.  Place items that you need to take out with you in the basket or on the pegboard.  You may also want to  include a message board for reminders.  7.    Use sticky notes.  Place sticky notes with reminders in a place where the task is performed.  For example:  turn off the  stove placed by the stove, lock the door placed on the door at eye level,  take your medications on  the bathroom mirror or by the place where you normally take your medications.  8.    Use alarms/timers.  Use while cooking to remind yourself to check on food or as a reminder to take your medicine, or as a  reminder to make a call, or as a reminder to perform another task, etc.

## 2024-08-23 NOTE — Patient Outreach (Signed)
 Complex Care Management   Visit Note  08/23/2024  Name:  Kelly Kennedy MRN: [id number] DOB: 14-Jan-1941  Situation: Referral received for Complex Care Management related to Anxiety and HTN I obtained verbal consent from Patient.  Visit completed with Patient  on the phone  Background:   Past Medical History:  Diagnosis Date   adenomatous Colon polyps    colonoscopy 8/99, 12/02, 8/06   Arthritis    Bell's palsy    Cancer (HCC)    DCIS left breast   Cataract    Chest pain    uses NTG as needed   Depression    Diabetes mellitus without complication (HCC)    Diverticulosis of colon 06/2005   Ductal carcinoma in situ (DCIS) of left breast    Family history of adverse reaction to anesthesia     my mother didn't wake up so they had to put her on life support for about an hour or more; my sisiter has problems waking up too from anesthesia.   GERD (gastroesophageal reflux disease)    Glaucoma    H/O hiatal hernia    Headache(784.0)    Hyperglycemia    Hyperlipidemia    Hypertension    Hypothyroid    Insomnia    Irritable bowel    Menopausal symptoms    Pericarditis 1980's   Stroke [facility])    TIA (transient ischemic attack)    09/2014    Assessment: Patient Reported Symptoms:  Cognitive Cognitive Status: Struggling with memory recall (issues forgetting medications, stopped cooking, forgeting and burned things, putting on wrong shoes, forgets names RNCM notifying PCP) Cognitive/Intellectual Conditions Management [RPT]: None reported or documented in medical history or problem list      Neurological Neurological Review of Symptoms: Dizziness Neurological Comment: increased memory concerns, occasional dizziness - no change - spoke with daughter after visit, verified noted increased difficulty with memory but no new issues missing medications, daughter will call PCP office to make follow up appointment to discuss memory  HEENT HEENT Symptoms Reported: No symptoms  reported HEENT Management Strategies: Routine screening    Cardiovascular Cardiovascular Symptoms Reported: Swelling in legs or feet Does patient have uncontrolled Hypertension?: No Cardiovascular Management Strategies: Medication therapy, Routine screening Cardiovascular Comment: leg swelling, no chest pain,  Respiratory Respiratory Symptoms Reported: No symptoms reported Additional Respiratory Details: SOB unchanged Respiratory Management Strategies: Routine screening  Endocrine Endocrine Symptoms Reported: Not assessed Is patient diabetic?: No    Gastrointestinal Gastrointestinal Symptoms Reported: Incontinence Gastrointestinal Management Strategies: Incontinence garment/pad Gastrointestinal Comment: patient no longer cooking - meals from daughter    Genitourinary Genitourinary Symptoms Reported: Incontinence Genitourinary Management Strategies: Incontinence garment/pad  Integumentary Integumentary Symptoms Reported: No symptoms reported Skin Management Strategies: Routine screening  Musculoskeletal Musculoskelatal Symptoms Reviewed: Joint pain, Muscle pain, Unsteady gait Additional Musculoskeletal Details: cane prn - forgets to use - no new falls, continues with breast pain into neck, shoulder seeing Gen Surgeon, MRI And CT chest Musculoskeletal Management Strategies: Routine screening Falls in the past year?: Yes Number of falls in past year: 2 or more Was there an injury with Fall?: Yes Fall Risk Category Calculator: 3 Patient Fall Risk Level: High Fall Risk Patient at Risk for Falls Due to: History of fall(s), Impaired balance/gait, Mental status change Fall risk Follow up: Falls evaluation completed, Falls prevention discussed  Psychosocial Additional Psychological Details: reports things better overall with sister, less stress, very pleasant mood, LCSW 09/02/24, noted changes in memory during discussion, repeating, a few word finding incidents, difficulty  focusing, forgets  meds, no longer cooking or driving, will discuss [facility] referral for pill packs with [name] and requesting she make PCP appointment  and notifying PCP of memory changes, spoke with [name] after visit - will make appointment with PCP to discuss memry concerns, states ot missing meds, declined [facility] referral for pll packs Behavioral Management Strategies: Medication therapy, Counseling Major Change/Loss/Stressor/Fears (CP): Medical condition, self, Relationship concerns      08/23/2024    PHQ2-9 Depression Screening   Little interest or pleasure in doing things Not at all  Feeling down, depressed, or hopeless Several days  PHQ-2 - Total Score 1  Trouble falling or staying asleep, or sleeping too much    Feeling tired or having little energy    Poor appetite or overeating     Feeling bad about yourself - or that you are a failure or have let yourself or your family down    Trouble concentrating on things, such as reading the newspaper or watching television    Moving or speaking so slowly that other people could have noticed.  Or the opposite - being so fidgety or restless that you have been moving around a lot more than usual    Thoughts that you would be better off dead, or hurting yourself in some way    PHQ2-9 Total Score    If you checked off any problems, how difficult have these problems made it for you to do your work, take care of things at home, or get along with other people    Depression Interventions/Treatment      There were no vitals filed for this visit.  Medications Reviewed Today     Reviewed by Devra Lands, RN (Registered Nurse) on 08/23/24 at 1310  Med List Status: <None>   Medication Order Taking? Sig Documenting Provider Last Dose Status Informant  amitriptyline  (ELAVIL ) 50 MG tablet [id number]  Take 1-2 tablets (50-100 mg total) by mouth at bedtime as needed (dispense w/o red dye). for sleep [name] Arlyss RAMAN, MD  Active   carvedilol  (COREG ) 12.5 MG  tablet [id number]  Take 1 tablet (12.5 mg total) by mouth 2 (two) times daily. [name] Arlyss RAMAN, MD  Active   cholestyramine  (QUESTRAN ) 4 g packet [id number]  Take 1 packet (4 g total) by mouth 3 (three) times daily as needed. [name] Arlyss RAMAN, MD  Active   cyanocobalamin  (,VITAMIN B-12,) 1000 MCG/ML injection [id number]  Inject 1 mL (1,000 mcg total) into the muscle every 30 (thirty) days. [name] Arlyss RAMAN, MD  Active Self  diclofenac  Sodium (VOLTAREN ) 1 % GEL [id number]  Apply 2 g topically 4 (four) times daily as needed. [name] Arlyss RAMAN, MD  Active   Evolocumab  (REPATHA  SURECLICK) 140 MG/ML EMMANUEL [id number]  ADMINISTER 1 ML UNDER THE SKIN EVERY 14 DAYS Croitoru, Mihai, MD  Active   isosorbide  mononitrate (IMDUR ) 30 MG 24 hr tablet [id number]  Take 1 tablet (30 mg total) by mouth daily. Croitoru, Mihai, MD  Active   levothyroxine  (SYNTHROID ) 75 MCG tablet [id number]  TAKE 1 TABLET BY MOUTH EVERY DAY BEFORE BREAKFAST [name] Arlyss RAMAN, MD  Active   LUMIGAN  0.01 % SOLN [id number]  Place 1 drop into both eyes at bedtime. [provider]  Active   memantine  (NAMENDA ) 10 MG tablet [id number]  TAKE 1 TABLET BY MOUTH TWICE A DAY [name] Arlyss RAMAN, MD  Active   metoprolol  tartrate (LOPRESSOR ) 50 MG tablet [id number]  Take 1 tablet (50 mg total) by mouth  once for 1 dose. Take 90-120 minutes prior to scan. Hold for SBP less than 110.  Patient not taking: Reported on 07/26/2024   Francyne Headland, MD  Expired 06/21/24 2359   nitrofurantoin  (MACRODANTIN ) 100 MG capsule [id number]  TAKE 1 CAPSULE BY MOUTH EVERY DAY [name] Arlyss RAMAN, MD  Active   nitroGLYCERIN  (NITROSTAT ) 0.4 MG SL tablet [id number]  Place 1 tablet (0.4 mg total) under the tongue every 5 (five) minutes as needed for chest pain. Croitoru, Mihai, MD  Active   omeprazole  (PRILOSEC) 40 MG capsule [id number]  TAKE 1 CAPSULE (40 MG TOTAL) BY MOUTH DAILY. Craig Alan SAUNDERS, PA-C  Active   solifenacin  (VESICARE ) 5 MG tablet [id number]  Take 1 tablet (5 mg total)  by mouth daily. [name] Arlyss RAMAN, MD  Active   terconazole  (TERAZOL 3 ) 0.8 % vaginal cream [id number]  Place 1 applicator vaginally at bedtime. [name] Arlyss RAMAN, MD  Active             Recommendation:   PCP Follow-up Continue Current Plan of Care  Follow Up Plan:   Telephone follow-up in 1 month  [name], MSN, RN [facility], [facility] RN Care Manager Direct Dial: [phone] Fax: [phone]'

## 2024-08-26 DIAGNOSIS — H34831 Tributary (branch) retinal vein occlusion, right eye, with macular edema: Secondary | ICD-10-CM | POA: Diagnosis not present

## 2024-08-29 DIAGNOSIS — M542 Cervicalgia: Secondary | ICD-10-CM | POA: Diagnosis not present

## 2024-08-29 DIAGNOSIS — Z853 Personal history of malignant neoplasm of breast: Secondary | ICD-10-CM | POA: Diagnosis not present

## 2024-09-02 ENCOUNTER — Encounter: Payer: Self-pay | Admitting: *Deleted

## 2024-09-02 ENCOUNTER — Telehealth: Payer: Self-pay | Admitting: *Deleted

## 2024-09-02 NOTE — Patient Instructions (Signed)
 Ashira Macario Gavel - I am sorry I was unable to reach you today. I work with [name] Arlyss RAMAN, MD and am calling to support your healthcare needs. Please contact me at [phone] at your earliest convenience. I look forward to speaking with you soon.   Thank you,     Mayte Diers, LCSW [place]  [facility], [facility] Health Licensed Clinical Social Worker  Direct Dial: [phone]

## 2024-09-09 ENCOUNTER — Ambulatory Visit (INDEPENDENT_AMBULATORY_CARE_PROVIDER_SITE_OTHER): Admitting: Family Medicine

## 2024-09-09 ENCOUNTER — Encounter: Payer: Self-pay | Admitting: Family Medicine

## 2024-09-09 VITALS — BP 146/82 | HR 77 | Temp 98.4°F | Ht 65.0 in | Wt 166.0 lb

## 2024-09-09 DIAGNOSIS — E7800 Pure hypercholesterolemia, unspecified: Secondary | ICD-10-CM | POA: Diagnosis not present

## 2024-09-09 DIAGNOSIS — E559 Vitamin D deficiency, unspecified: Secondary | ICD-10-CM

## 2024-09-09 DIAGNOSIS — Z8639 Personal history of other endocrine, nutritional and metabolic disease: Secondary | ICD-10-CM | POA: Diagnosis not present

## 2024-09-09 DIAGNOSIS — R413 Other amnesia: Secondary | ICD-10-CM

## 2024-09-09 DIAGNOSIS — E538 Deficiency of other specified B group vitamins: Secondary | ICD-10-CM | POA: Diagnosis not present

## 2024-09-09 DIAGNOSIS — Z23 Encounter for immunization: Secondary | ICD-10-CM

## 2024-09-09 DIAGNOSIS — E039 Hypothyroidism, unspecified: Secondary | ICD-10-CM | POA: Diagnosis not present

## 2024-09-09 MED ORDER — LEVOTHYROXINE SODIUM 75 MCG PO TABS: ORAL_TABLET | ORAL | 3 refills | Status: AC

## 2024-09-09 NOTE — Progress Notes (Unsigned)
 Not due for B12 dose today.  Discussed.  Memory d/w pt.  Still on namenda .  Had nightmares with donepezil .  Has tolerated namenda .  Patient family had noted gradual memory changes.  See MMSE below.  Driving d/w pt. Not driving in the last ~9 months.  She is still living at home.  Her sister lives with her.  See notes on labs regarding vitamin D.  Gradual weight loss noted.    She had eval with Dr. Vanderbilt with plan for ortho eval re: her neck, re: L sided pain.    Meds, vitals, and allergies reviewed.   ROS: Per HPI unless specifically indicated in ROS section   Nad Ncat Neck supple, no LA Rrr Ctab Abd soft not ttp Skin well perfused.  Trace BLE edema at baseline.  MMSE 18 out of 30.  -5 for orientation -2 for attention -3 for recall -1 for writing a sentence, -1 for copying pentagons.

## 2024-09-09 NOTE — Patient Instructions (Addendum)
 If you labs are fine, then we'll likely get the CT set up.  We'll update you.  Take care.  Glad to see you.

## 2024-09-10 LAB — CBC WITH DIFFERENTIAL/PLATELET
Basophils Absolute: 0.1 K/uL (ref 0.0–0.1)
Basophils Relative: 1.3 % (ref 0.0–3.0)
Eosinophils Absolute: 0.1 K/uL (ref 0.0–0.7)
Eosinophils Relative: 1.8 % (ref 0.0–5.0)
HCT: 37.6 % (ref 36.0–46.0)
Hemoglobin: 12.5 g/dL (ref 12.0–15.0)
Lymphocytes Relative: 55.8 % — ABNORMAL HIGH (ref 12.0–46.0)
Lymphs Abs: 3.1 K/uL (ref 0.7–4.0)
MCHC: 33.2 g/dL (ref 30.0–36.0)
MCV: 87.4 fl (ref 78.0–100.0)
Monocytes Absolute: 0.6 K/uL (ref 0.1–1.0)
Monocytes Relative: 11.6 % (ref 3.0–12.0)
Neutro Abs: 1.6 K/uL (ref 1.4–7.7)
Neutrophils Relative %: 29.5 % — ABNORMAL LOW (ref 43.0–77.0)
Platelets: 342 K/uL (ref 150.0–400.0)
RBC: 4.31 Mil/uL (ref 3.87–5.11)
RDW: 14.2 % (ref 11.5–15.5)
WBC: 5.6 K/uL (ref 4.0–10.5)

## 2024-09-10 LAB — HEMOGLOBIN A1C: Hgb A1c MFr Bld: 6.4 % (ref 4.6–6.5)

## 2024-09-10 LAB — VITAMIN B12: Vitamin B-12: 944 pg/mL — ABNORMAL HIGH (ref 211–911)

## 2024-09-10 LAB — COMPREHENSIVE METABOLIC PANEL WITH GFR
ALT: 11 U/L (ref 0–35)
AST: 18 U/L (ref 0–37)
Albumin: 3.9 g/dL (ref 3.5–5.2)
Alkaline Phosphatase: 49 U/L (ref 39–117)
BUN: 11 mg/dL (ref 6–23)
CO2: 31 meq/L (ref 19–32)
Calcium: 9.1 mg/dL (ref 8.4–10.5)
Chloride: 105 meq/L (ref 96–112)
Creatinine, Ser: 0.92 mg/dL (ref 0.40–1.20)
GFR: 57.96 mL/min — ABNORMAL LOW (ref >60.00)
Glucose, Bld: 93 mg/dL (ref 70–99)
Potassium: 3.8 meq/L (ref 3.5–5.1)
Sodium: 143 meq/L (ref 135–145)
Total Bilirubin: 0.5 mg/dL (ref 0.2–1.2)
Total Protein: 6.3 g/dL (ref 6.0–8.3)

## 2024-09-10 LAB — LIPID PANEL
Cholesterol: 150 mg/dL (ref 0–200)
HDL: 67.5 mg/dL (ref >39.00)
LDL Cholesterol: 61 mg/dL (ref 0–99)
NonHDL: 82.09
Total CHOL/HDL Ratio: 2
Triglycerides: 107 mg/dL (ref 0.0–149.0)
VLDL: 21.4 mg/dL (ref 0.0–40.0)

## 2024-09-10 LAB — TSH: TSH: 1.14 u[IU]/mL (ref 0.35–5.50)

## 2024-09-10 LAB — VITAMIN D 25 HYDROXY (VIT D DEFICIENCY, FRACTURES): VITD: <7 ng/mL — ABNORMAL LOW (ref 30.00–100.00)

## 2024-09-11 ENCOUNTER — Encounter (HOSPITAL_COMMUNITY): Payer: Self-pay

## 2024-09-11 ENCOUNTER — Emergency Department (HOSPITAL_COMMUNITY)

## 2024-09-11 ENCOUNTER — Observation Stay (HOSPITAL_COMMUNITY)
Admission: EM | Admit: 2024-09-11 | Discharge: 2024-09-12 | Disposition: A | Discharge status: Home / Self Care | Attending: Student | Admitting: Student

## 2024-09-11 ENCOUNTER — Telehealth: Payer: Self-pay

## 2024-09-11 ENCOUNTER — Other Ambulatory Visit: Payer: Self-pay

## 2024-09-11 ENCOUNTER — Ambulatory Visit: Payer: Self-pay | Admitting: Family Medicine

## 2024-09-11 DIAGNOSIS — I10 Essential (primary) hypertension: Secondary | ICD-10-CM | POA: Diagnosis not present

## 2024-09-11 DIAGNOSIS — Z6827 Body mass index (BMI) 27.0-27.9, adult: Secondary | ICD-10-CM | POA: Insufficient documentation

## 2024-09-11 DIAGNOSIS — I2510 Atherosclerotic heart disease of native coronary artery without angina pectoris: Secondary | ICD-10-CM | POA: Diagnosis not present

## 2024-09-11 DIAGNOSIS — Z853 Personal history of malignant neoplasm of breast: Secondary | ICD-10-CM | POA: Insufficient documentation

## 2024-09-11 DIAGNOSIS — I959 Hypotension, unspecified: Secondary | ICD-10-CM | POA: Diagnosis not present

## 2024-09-11 DIAGNOSIS — E1122 Type 2 diabetes mellitus with diabetic chronic kidney disease: Secondary | ICD-10-CM | POA: Diagnosis not present

## 2024-09-11 DIAGNOSIS — Z8673 Personal history of transient ischemic attack (TIA), and cerebral infarction without residual deficits: Secondary | ICD-10-CM | POA: Diagnosis not present

## 2024-09-11 DIAGNOSIS — E876 Hypokalemia: Secondary | ICD-10-CM | POA: Insufficient documentation

## 2024-09-11 DIAGNOSIS — I129 Hypertensive chronic kidney disease with stage 1 through stage 4 chronic kidney disease, or unspecified chronic kidney disease: Secondary | ICD-10-CM | POA: Diagnosis not present

## 2024-09-11 DIAGNOSIS — E119 Type 2 diabetes mellitus without complications: Secondary | ICD-10-CM | POA: Diagnosis not present

## 2024-09-11 DIAGNOSIS — E039 Hypothyroidism, unspecified: Secondary | ICD-10-CM | POA: Insufficient documentation

## 2024-09-11 DIAGNOSIS — D649 Anemia, unspecified: Secondary | ICD-10-CM | POA: Diagnosis not present

## 2024-09-11 DIAGNOSIS — E7800 Pure hypercholesterolemia, unspecified: Secondary | ICD-10-CM | POA: Diagnosis not present

## 2024-09-11 DIAGNOSIS — I25118 Atherosclerotic heart disease of native coronary artery with other forms of angina pectoris: Secondary | ICD-10-CM | POA: Diagnosis not present

## 2024-09-11 DIAGNOSIS — Z87891 Personal history of nicotine dependence: Secondary | ICD-10-CM | POA: Insufficient documentation

## 2024-09-11 DIAGNOSIS — R2689 Other abnormalities of gait and mobility: Secondary | ICD-10-CM | POA: Diagnosis not present

## 2024-09-11 DIAGNOSIS — R55 Syncope and collapse: Secondary | ICD-10-CM | POA: Diagnosis present

## 2024-09-11 DIAGNOSIS — R413 Other amnesia: Secondary | ICD-10-CM | POA: Diagnosis not present

## 2024-09-11 DIAGNOSIS — N1831 Chronic kidney disease, stage 3a: Secondary | ICD-10-CM | POA: Diagnosis not present

## 2024-09-11 DIAGNOSIS — E559 Vitamin D deficiency, unspecified: Secondary | ICD-10-CM | POA: Insufficient documentation

## 2024-09-11 DIAGNOSIS — R404 Transient alteration of awareness: Secondary | ICD-10-CM | POA: Diagnosis not present

## 2024-09-11 DIAGNOSIS — Z79899 Other long term (current) drug therapy: Secondary | ICD-10-CM | POA: Insufficient documentation

## 2024-09-11 DIAGNOSIS — Z8679 Personal history of other diseases of the circulatory system: Secondary | ICD-10-CM | POA: Insufficient documentation

## 2024-09-11 LAB — URINALYSIS, ROUTINE W REFLEX MICROSCOPIC
Bilirubin Urine: NEGATIVE
Glucose, UA: NEGATIVE mg/dL
Hgb urine dipstick: NEGATIVE
Ketones, ur: NEGATIVE mg/dL
Nitrite: NEGATIVE
Protein, ur: NEGATIVE mg/dL
Specific Gravity, Urine: 1.008 (ref 1.005–1.030)
pH: 6 (ref 5.0–8.0)

## 2024-09-11 LAB — COMPREHENSIVE METABOLIC PANEL WITH GFR
ALT: 10 U/L (ref 0–44)
AST: 24 U/L (ref 15–41)
Albumin: 2.8 g/dL — ABNORMAL LOW (ref 3.5–5.0)
Alkaline Phosphatase: 40 U/L (ref 38–126)
Anion gap: 13 (ref 5–15)
BUN: 10 mg/dL (ref 8–23)
CO2: 21 mmol/L — ABNORMAL LOW (ref 22–32)
Calcium: 8.1 mg/dL — ABNORMAL LOW (ref 8.9–10.3)
Chloride: 107 mmol/L (ref 98–111)
Creatinine, Ser: 1.13 mg/dL — ABNORMAL HIGH (ref 0.44–1.00)
GFR, Estimated: 49 mL/min — ABNORMAL LOW (ref >60)
Glucose, Bld: 99 mg/dL (ref 70–99)
Potassium: 3.4 mmol/L — ABNORMAL LOW (ref 3.5–5.1)
Sodium: 141 mmol/L (ref 135–145)
Total Bilirubin: 0.4 mg/dL (ref 0.0–1.2)
Total Protein: 5.4 g/dL — ABNORMAL LOW (ref 6.5–8.1)

## 2024-09-11 LAB — CBC
HCT: 32.4 % — ABNORMAL LOW (ref 36.0–46.0)
Hemoglobin: 10.4 g/dL — ABNORMAL LOW (ref 12.0–15.0)
MCH: 28.7 pg (ref 26.0–34.0)
MCHC: 32.1 g/dL (ref 30.0–36.0)
MCV: 89.3 fL (ref 80.0–100.0)
Platelets: 296 K/uL (ref 150–400)
RBC: 3.63 MIL/uL — ABNORMAL LOW (ref 3.87–5.11)
RDW: 14.4 % (ref 11.5–15.5)
WBC: 4.5 K/uL (ref 4.0–10.5)
nRBC: 0 % (ref 0.0–0.2)

## 2024-09-11 LAB — TROPONIN I (HIGH SENSITIVITY): Troponin I (High Sensitivity): 5 ng/L (ref <18)

## 2024-09-11 MED ORDER — POTASSIUM CHLORIDE CRYS ER 20 MEQ PO TBCR
40.0000 meq | EXTENDED_RELEASE_TABLET | Freq: Once | ORAL | Status: AC
  Administered 2024-09-11: 40 meq via ORAL
  Filled 2024-09-11: qty 2

## 2024-09-11 MED ORDER — ACETAMINOPHEN 650 MG RE SUPP: 650.0000 mg | Freq: Four times a day (QID) | RECTAL | Status: DC | PRN

## 2024-09-11 MED ORDER — POLYETHYLENE GLYCOL 3350 17 G PO PACK: 17.0000 g | PACK | Freq: Every day | ORAL | Status: DC | PRN

## 2024-09-11 MED ORDER — VITAMIN D (ERGOCALCIFEROL) 1.25 MG (50000 UNIT) PO CAPS: ORAL_CAPSULE | ORAL | 0 refills | Status: DC

## 2024-09-11 MED ORDER — FESOTERODINE FUMARATE ER 4 MG PO TB24
4.0000 mg | ORAL_TABLET | Freq: Every day | ORAL | Status: DC
  Administered 2024-09-12: 4 mg via ORAL
  Filled 2024-09-11: qty 1

## 2024-09-11 MED ORDER — ONDANSETRON HCL 4 MG/2ML IJ SOLN: 4.0000 mg | Freq: Four times a day (QID) | INTRAMUSCULAR | Status: DC | PRN

## 2024-09-11 MED ORDER — ISOSORBIDE MONONITRATE ER 30 MG PO TB24
30.0000 mg | ORAL_TABLET | Freq: Every day | ORAL | Status: DC
  Administered 2024-09-12: 30 mg via ORAL
  Filled 2024-09-11: qty 1

## 2024-09-11 MED ORDER — INSULIN ASPART 100 UNIT/ML IJ SOLN: 0.0000 [IU] | Freq: Three times a day (TID) | INTRAMUSCULAR | Status: DC

## 2024-09-11 MED ORDER — ACETAMINOPHEN 325 MG PO TABS: 650.0000 mg | ORAL_TABLET | Freq: Four times a day (QID) | ORAL | Status: DC | PRN

## 2024-09-11 MED ORDER — LEVOTHYROXINE SODIUM 75 MCG PO TABS
75.0000 ug | ORAL_TABLET | Freq: Every day | ORAL | Status: DC
  Administered 2024-09-12: 75 ug via ORAL
  Filled 2024-09-11: qty 1

## 2024-09-11 MED ORDER — ENOXAPARIN SODIUM 40 MG/0.4ML IJ SOSY
40.0000 mg | PREFILLED_SYRINGE | INTRAMUSCULAR | Status: DC
  Administered 2024-09-12: 40 mg via SUBCUTANEOUS
  Filled 2024-09-11: qty 0.4

## 2024-09-11 MED ORDER — CARVEDILOL 12.5 MG PO TABS
12.5000 mg | ORAL_TABLET | Freq: Two times a day (BID) | ORAL | Status: DC
  Administered 2024-09-12: 12.5 mg via ORAL
  Filled 2024-09-11: qty 1

## 2024-09-11 MED ORDER — ONDANSETRON HCL 4 MG PO TABS: 4.0000 mg | ORAL_TABLET | Freq: Four times a day (QID) | ORAL | Status: DC | PRN

## 2024-09-11 MED ORDER — SODIUM CHLORIDE 0.9% FLUSH: 3.0000 mL | Freq: Two times a day (BID) | INTRAVENOUS | Status: DC

## 2024-09-11 MED ORDER — LACTATED RINGERS IV BOLUS
1000.0000 mL | Freq: Once | INTRAVENOUS | Status: AC
  Administered 2024-09-11: 1000 mL via INTRAVENOUS

## 2024-09-11 MED ORDER — MEMANTINE HCL 10 MG PO TABS
10.0000 mg | ORAL_TABLET | Freq: Two times a day (BID) | ORAL | Status: DC
  Administered 2024-09-11 – 2024-09-12 (×2): 10 mg via ORAL
  Filled 2024-09-11 (×2): qty 1

## 2024-09-11 MED ORDER — AMITRIPTYLINE HCL 25 MG PO TABS
50.0000 mg | ORAL_TABLET | Freq: Every evening | ORAL | Status: DC | PRN
  Administered 2024-09-11: 100 mg via ORAL
  Filled 2024-09-11: qty 2

## 2024-09-11 NOTE — ED Provider Notes (Signed)
 [place] [facility] AT [facility] Provider Note   CSN: [id number] Arrival date & time: 09/11/24  1816     Patient presents with: Hypotension   Kelly Kennedy Aiman Sonn is a 83 y.o. female.   This is a 83 year old female presenting emergency department after syncopal episode at home.  Was sitting talking with daughter and petting her pets when she syncopized without prodromal symptoms.  Was reportedly hypotensive with EMS, but is been normotensive here.  She has no complaints currently.  Has been in normal state of health for the past several days.  No URI symptoms.  No acute blood loss.  Not currently having chest pain or shortness of breath.  Low risk for PE based on Wells criteria.        Prior to Admission medications   Medication Sig Start Date End Date Taking? Authorizing Provider  amitriptyline  (ELAVIL ) 50 MG tablet Take 1-2 tablets (50-100 mg total) by mouth at bedtime as needed (dispense w/o red dye). for sleep 04/02/24  Yes [name] Arlyss RAMAN, MD  carvedilol  (COREG ) 12.5 MG tablet Take 1 tablet (12.5 mg total) by mouth 2 (two) times daily. 12/04/23  Yes [name] Arlyss RAMAN, MD  cholestyramine  (QUESTRAN ) 4 g packet Take 1 packet (4 g total) by mouth 3 (three) times daily as needed. 04/16/24  Yes [name] Arlyss RAMAN, MD  cyanocobalamin  (,VITAMIN B-12,) 1000 MCG/ML injection Inject 1 mL (1,000 mcg total) into the muscle every 30 (thirty) days. 07/07/21  Yes [name] Arlyss RAMAN, MD  diclofenac  Sodium (VOLTAREN ) 1 % GEL Apply 2 g topically 4 (four) times daily as needed. 02/03/24  Yes [name] Arlyss RAMAN, MD  Evolocumab  (REPATHA  SURECLICK) 140 MG/ML SOAJ ADMINISTER 1 ML UNDER THE SKIN EVERY 14 DAYS 01/26/24  Yes Croitoru, Mihai, MD  isosorbide  mononitrate (IMDUR ) 30 MG 24 hr tablet Take 1 tablet (30 mg total) by mouth daily. 06/21/24  Yes Croitoru, Mihai, MD  levothyroxine  (SYNTHROID ) 75 MCG tablet TAKE 1 TABLET BY MOUTH EVERY DAY BEFORE BREAKFAST 09/09/24  Yes [name] Arlyss RAMAN,  MD  LUMIGAN  0.01 % SOLN Place 1 drop into both eyes at bedtime. 12/05/23  Yes [provider]  memantine  (NAMENDA ) 10 MG tablet TAKE 1 TABLET BY MOUTH TWICE A DAY 07/25/24  Yes [name] Arlyss RAMAN, MD  nitrofurantoin  (MACRODANTIN ) 100 MG capsule TAKE 1 CAPSULE BY MOUTH EVERY DAY 07/30/24  Yes [name] Arlyss RAMAN, MD  nitroGLYCERIN  (NITROSTAT ) 0.4 MG SL tablet Place 1 tablet (0.4 mg total) under the tongue every 5 (five) minutes as needed for chest pain. 06/21/24  Yes Croitoru, Mihai, MD  omeprazole  (PRILOSEC) 40 MG capsule TAKE 1 CAPSULE (40 MG TOTAL) BY MOUTH DAILY. 04/14/24  Yes Craig Palma R, PA-C  solifenacin  (VESICARE ) 5 MG tablet Take 1 tablet (5 mg total) by mouth daily. 02/11/24  Yes [name] Arlyss RAMAN, MD    Allergies: Ace inhibitors, Angiotensin receptor blockers, Statins, Donepezil , Glipizide , Iron , Sulfa  antibiotics, Flonase  [fluticasone  propionate], Niacin, and Red dye #40 (allura red)    Review of Systems  Updated Vital Signs BP (!) 157/73   Pulse 87   Temp 97.8 F (36.6 C)   Resp 18   Ht 5' 5 (1.651 m)   Wt 75.3 kg   SpO2 100%   BMI 27.62 kg/m   Physical Exam Vitals and nursing note reviewed.  Constitutional:      General: She is not in acute distress.    Appearance: She is not toxic-appearing.  HENT:  Head: Normocephalic.     Nose: Nose normal.     Mouth/Throat:     Mouth: Mucous membranes are moist.  Eyes:     Conjunctiva/sclera: Conjunctivae normal.  Cardiovascular:     Rate and Rhythm: Normal rate and regular rhythm.  Pulmonary:     Effort: Pulmonary effort is normal.  Abdominal:     General: Abdomen is flat. There is no distension.     Palpations: Abdomen is soft.     Tenderness: There is no abdominal tenderness. There is no guarding or rebound.  Musculoskeletal:        General: Normal range of motion.  Skin:    General: Skin is warm and dry.     Capillary Refill: Capillary refill takes less than 2 seconds.  Neurological:     Mental Status: She  is alert and oriented to person, place, and time.  Psychiatric:        Mood and Affect: Mood normal.        Behavior: Behavior normal.     (all labs ordered are listed, but only abnormal results are displayed) Labs Reviewed  CBC - Abnormal; Notable for the following components:      Result Value   RBC 3.63 (*)    Hemoglobin 10.4 (*)    HCT 32.4 (*)    All other components within normal limits  COMPREHENSIVE METABOLIC PANEL WITH GFR - Abnormal; Notable for the following components:   Potassium 3.4 (*)    CO2 21 (*)    Creatinine, Ser 1.13 (*)    Calcium  8.1 (*)    Total Protein 5.4 (*)    Albumin 2.8 (*)    GFR, Estimated 49 (*)    All other components within normal limits  URINALYSIS, ROUTINE W REFLEX MICROSCOPIC - Abnormal; Notable for the following components:   APPearance HAZY (*)    Leukocytes,Ua LARGE (*)    Bacteria, UA RARE (*)    Non Squamous Epithelial 0-5 (*)    All other components within normal limits  BASIC METABOLIC PANEL WITH GFR  MAGNESIUM   CBC  TROPONIN I (HIGH SENSITIVITY)  TROPONIN I (HIGH SENSITIVITY)    EKG: EKG Interpretation Date/Time:  Wednesday September 11 2024 18:25:59 EDT Ventricular Rate:  75 PR Interval:  195 QRS Duration:  102 QT Interval:  418 QTC Calculation: 467 R Axis:   69  Text Interpretation: Sinus rhythm Probable left ventricular hypertrophy Confirmed by Neysa Clap [phone]) on 09/11/2024 9:00:09 PM  Radiology: DG Chest Portable 1 View Result Date: 09/11/2024 EXAM: 1 VIEW(S) XRAY OF THE CHEST 11/22/2022 COMPARISON: None available. CLINICAL HISTORY: syncope. Per chart - Patient BIB [facility] from home after witnessed syncopal episode, immediately after patient 50/palp, GCS 9, no HR compensation d/t beta blockers, HR remained 70. Improved to GCS 15 feeling lethargic shortly after, EKG unremarkable, EMS gave ; 400mL NS. Family reports this has happened before and patient VS were stable at PCP 2 days ago. ; During event patient states  she was sitting in a chair, playing with the cat. FINDINGS: LUNGS AND PLEURA: No focal pulmonary opacity. No pulmonary edema. No pleural effusion. No pneumothorax. HEART AND MEDIASTINUM: No acute abnormality of the cardiac and mediastinal silhouettes. VASCULATURE: Thoracic atherosclerosis. BONES AND SOFT TISSUES: No acute osseous abnormality. IMPRESSION: 1. No acute cardiopulmonary process Electronically signed by: Pinkie Pebbles MD 09/11/2024 07:04 PM EDT RP Workstation: [id number]     Procedures   Medications Ordered in the ED  carvedilol  (COREG ) tablet 12.5 mg (has  no administration in time range)  isosorbide  mononitrate (IMDUR ) 24 hr tablet 30 mg (has no administration in time range)  amitriptyline  (ELAVIL ) tablet 50-100 mg (has no administration in time range)  memantine  (NAMENDA ) tablet 10 mg (has no administration in time range)  levothyroxine  (SYNTHROID ) tablet 75 mcg (has no administration in time range)  fesoterodine  (TOVIAZ ) tablet 4 mg (has no administration in time range)  sodium chloride  flush (NS) 0.9 % injection 3 mL (has no administration in time range)  enoxaparin  (LOVENOX ) injection 40 mg (has no administration in time range)  potassium chloride  SA (KLOR-CON  M) CR tablet 40 mEq (has no administration in time range)  acetaminophen  (TYLENOL ) tablet 650 mg (has no administration in time range)    Or  acetaminophen  (TYLENOL ) suppository 650 mg (has no administration in time range)  ondansetron  (ZOFRAN ) tablet 4 mg (has no administration in time range)    Or  ondansetron  (ZOFRAN ) injection 4 mg (has no administration in time range)  polyethylene glycol (MIRALAX  / GLYCOLAX ) packet 17 g (has no administration in time range)  insulin  aspart (novoLOG ) injection 0-6 Units (has no administration in time range)  lactated ringers  bolus 1,000 mL (0 mLs Intravenous Stopped 09/11/24 2209)    Clinical Course as of 09/11/24 2335  Wed Sep 11, 2024  2100 EKG 12-Lead On my independent  review.  Sinus rhythm.  Normal intervals.  No ischemic changes.  QTc 467. [TY]  2240 Spoke with Dr. Donavan who agrees to admit patient. [TY]    Clinical Course User Index [TY] Neysa Caron PARAS, DO                                 Medical Decision Making This is a 83 year old presenting emergency department after a syncopal episode.  She has numerous comorbidities to include advanced age, hypertension, hyperlipidemia, hypothyroid, pericarditis history, diabetes, prior stroke, prior cancer.  EMS reported hypotension, but  patient has been normotensive/hypertensive here.  Not appear to be in distress.  No focal neurologic deficits to suggest acute intracranial pathology.  EKG without hemic changes.  Troponin negative.  Minor elevation of creatinine, but consistent with AKI.  Does have a drop in hemoglobin, but no overt blood loss.  She is essentially asymptomatic currently.  Was given a liter of IV fluids here.  Shared decision making with patient regarding admission versus outpatient follow-up.  Will admit patient for cardiac monitoring/observation for her syncopal episode.  Amount and/or Complexity of Data Reviewed Independent Historian:     Details: Family notes patient with normal mentation. External Data Reviewed:     Details: Had recent CT scan that showed some mild occlusion to her mid LAD Labs: ordered. Decision-making details documented in ED Course. Radiology: ordered and independent interpretation performed. ECG/medicine tests: ordered and independent interpretation performed. Decision-making details documented in ED Course.  Risk Decision regarding hospitalization.       Final diagnoses:  Syncope, unspecified syncope type    ED Discharge Orders     None          Neysa Caron PARAS, DO 09/11/24 2335

## 2024-09-11 NOTE — Telephone Encounter (Signed)
 Copied from CRM [id number]. Topic: General - Other >> Sep 11, 2024  3:18 PM Thersia BROCKS wrote: Reason for CRM: Patient called in wanted to know if Dr.Duncan can write a new handicapped , stated she lost it

## 2024-09-11 NOTE — Assessment & Plan Note (Signed)
 See notes on labs.

## 2024-09-11 NOTE — ED Triage Notes (Addendum)
 Patient BIB [facility] from home after witnessed syncopal episode, immediately after patient 50/palp, GCS 9, no HR compensation d/t beta blockers, HR remained 70. Improved to GCS 15 feeling lethargic shortly after, EKG unremarkable, EMS gave 400mL NS. Family reports this has happened before and patient VS were stable at PCP 2 days ago.   During event patient states she was sitting in a chair, playing with the cat.  BP 130/72  HR 72 94% RA  CBG 115

## 2024-09-11 NOTE — Assessment & Plan Note (Signed)
 MMSE 18 out of 30.  -5 for orientation -2 for attention -3 for recall -1 for writing a sentence, -1 for copying pentagons.  Discussed checking basic labs today and getting head CT set up.  Continue Namenda  for now.  Not yet due for B12 dose.  Has support from family at home.  Not driving.  At this point still okay for outpatient follow-up. 35 minutes were devoted to patient care in this encounter (this includes time spent reviewing the patient's file/history, interviewing and examining the patient, counseling/reviewing plan with patient).

## 2024-09-11 NOTE — H&P (Signed)
 History and Physical    Kelly Kennedy FMW:[id number] DOB: 24-Feb-1941 DOA: 09/11/2024  PCP: [name] Arlyss RAMAN, MD   Patient coming from: Home   Chief Complaint: LOC   HPI: Kelly Kennedy is an 83 y.o. female with medical history significant for hypertension, hyperlipidemia, diet-controlled diabetes mellitus, CAD, CKD 3A, hypothyroidism, and memory loss who presents after a witnessed loss of consciousness at home.  Patient reports that she was in her usual state and having an uneventful day when she was seated in a chair talking with several family members and was observed to lose consciousness.  Her daughter saw her head slumped forward, noted that she appeared to be breathing abnormally, and was pale and diaphoretic.  She soon regained consciousness but was very lethargic.  EMS was called, patient continued to be lethargic on their arrival, and SBP was noted to be 50.  She was given 400 mL of NS prior to arrival in the ED and gradually return to her usual state.  She reports sometimes feeling lightheaded upon standing but did not have any preceding lightheadedness, chest discomfort, or other symptoms prior to this event.  Patient notes that she also had an episode approximately a month ago when she lost consciousness while seated on the toilet and woke with nausea and vomiting.  Additionally, she had an episode several months ago that was similar but she has not sought medical evaluation for syncope until today.  ED Course: Upon arrival to the ED, patient is found to be afebrile and saturating well on room air with normal HR and stable BP.  Labs are most notable for potassium 3.4, creatinine 1.13, albumin 2.8, normal WBC, hemoglobin 10.4, and normal troponin.  Patient was given a liter of LR in the ED.  Review of Systems:  All other systems reviewed and apart from HPI, are negative.  Past Medical History:  Diagnosis Date   adenomatous Colon polyps    colonoscopy 8/99, 12/02,  8/06   Arthritis    Bell's palsy    Cancer (HCC)    DCIS left breast   Cataract    Chest pain    uses NTG as needed   Depression    Diabetes mellitus without complication (HCC)    Diverticulosis of colon 06/2005   Ductal carcinoma in situ (DCIS) of left breast    Family history of adverse reaction to anesthesia     my mother didn't wake up so they had to put her on life support for about an hour or more; my sisiter has problems waking up too from anesthesia.   GERD (gastroesophageal reflux disease)    Glaucoma    H/O hiatal hernia    Headache(784.0)    Hyperglycemia    Hyperlipidemia    Hypertension    Hypothyroid    Insomnia    Irritable bowel    Menopausal symptoms    Pericarditis 1980's   Stroke [facility])    TIA (transient ischemic attack)    09/2014    Past Surgical History:  Procedure Laterality Date   APPENDECTOMY     BREAST EXCISIONAL BIOPSY Right    benign   BREAST RECONSTRUCTION WITH PLACEMENT OF TISSUE EXPANDER AND FLEX HD (ACELLULAR HYDRATED DERMIS) Left 08/06/2015   Procedure: LEFT BREAST RECONSTRUCTION WITH PLACEMENT OF SALINE IMPLANT AND ACELLULAR DURMAL MATRIX;  Surgeon: Alm Sick, MD;  Location: [facility] OR;  Service: Plastics;  Laterality: Left;   CARDIAC CATHETERIZATION     CATARACT EXTRACTION Bilateral 2016   CHOLECYSTECTOMY  1990   COLONOSCOPY     CYSTECTOMY  05/04/2008   left lower arm, benign   ECTOPIC PREGNANCY SURGERY  1971   FRACTURE SURGERY     nose   LEFT HEART CATH AND CORONARY ANGIOGRAPHY [name] 09/21/2018   Procedure: LEFT HEART CATH AND CORONARY ANGIOGRAPHY;  Surgeon: Anner Alm ORN, MD;  Location: [facility] INVASIVE CV LAB;  Service: Cardiovascular;  Laterality: [name];   LEFT HEART CATH AND CORONARY ANGIOGRAPHY [name] 11/23/2022   Procedure: LEFT HEART CATH AND CORONARY ANGIOGRAPHY;  Surgeon: Wonda Sharper, MD;  Location: [facility] INVASIVE CV LAB;  Service: Cardiovascular;  Laterality: [name];   MASTECTOMY Left 2016   NIPPLE SPARING MASTECTOMY Left 08/06/2015    Procedure: LEFT NIPPLE SPARING MASTECTOMY;  Surgeon: Debby Shipper, MD;  Location: [facility] OR;  Service: General;  Laterality: Left;   ORIF ANKLE FRACTURE Left 10/23/2013   Procedure: OPEN REDUCTION INTERNAL FIXATION (ORIF) LEFT ANKLE FRACTURE;  Surgeon: Kay Sharper Cummins, MD;  Location: [facility] OR;  Service: Orthopedics;  Laterality: Left;   PARTIAL HYSTERECTOMY     ovaries intact   PARTIAL MASTECTOMY WITH NEEDLE LOCALIZATION AND AXILLARY SENTINEL LYMPH NODE BX Left 05/06/2015   Procedure: LEFT BREAST PARTIAL MASTECTOMY WITH NEEDLE LOCALIZATION AND SENTINEL LYMPH NODE MAPPING;  Surgeon: Debby Shipper, MD;  Location: [facility];  Service: General;  Laterality: Left;   TONSILLECTOMY     TUBAL LIGATION      Social History:   reports that she quit smoking about 7 years ago. Her smoking use included cigarettes. She has quit using smokeless tobacco. She reports that she does not drink alcohol  and does not use drugs.  Allergies  Allergen Reactions   Ace Inhibitors Swelling    H/o lip swelling with change in losartan  pill   Angiotensin Receptor Blockers Swelling    H/o lip swelling with change in losartan  pill   Statins Anaphylaxis    myalgias   Donepezil      Nightmares   Flonase  [Fluticasone  Propionate] Other (See Comments)    Intolerant of smell   Glipizide  Other (See Comments)    unstready when on med.     Iron  Other (See Comments)    Bloating with oral iron    Sulfa  Antibiotics Other (See Comments)    Intolerant of sulfa  but able to tolerate trimethoprim    Niacin Rash   Red Dye #40 (Allura Red) Rash    Family History  Problem Relation Age of Onset   Cancer Father        stomach    Stomach cancer Father    Colon cancer Father    Heart disease Son        MI   Arthritis Mother    Diabetes Mother    Cancer Brother        lung cancer   Pancreatic cancer Neg Hx    Breast cancer Neg Hx    Esophageal cancer Neg Hx    Rectal cancer Neg Hx      Prior to Admission  medications   Medication Sig Start Date End Date Taking? Authorizing Provider  amitriptyline  (ELAVIL ) 50 MG tablet Take 1-2 tablets (50-100 mg total) by mouth at bedtime as needed (dispense w/o red dye). for sleep 04/02/24   [name] Arlyss RAMAN, MD  carvedilol  (COREG ) 12.5 MG tablet Take 1 tablet (12.5 mg total) by mouth 2 (two) times daily. 12/04/23   [name] Arlyss RAMAN, MD  cholestyramine  (QUESTRAN ) 4 g packet Take 1 packet (4 g total) by mouth 3 (  three) times daily as needed. 04/16/24   [name] Arlyss RAMAN, MD  cyanocobalamin  (,VITAMIN B-12,) 1000 MCG/ML injection Inject 1 mL (1,000 mcg total) into the muscle every 30 (thirty) days. 07/07/21   [name] Arlyss RAMAN, MD  diclofenac  Sodium (VOLTAREN ) 1 % GEL Apply 2 g topically 4 (four) times daily as needed. 02/03/24   [name] Arlyss RAMAN, MD  Evolocumab  (REPATHA  SURECLICK) 140 MG/ML SOAJ ADMINISTER 1 ML UNDER THE SKIN EVERY 14 DAYS 01/26/24   Croitoru, Jerel, MD  isosorbide  mononitrate (IMDUR ) 30 MG 24 hr tablet Take 1 tablet (30 mg total) by mouth daily. 06/21/24   Croitoru, Mihai, MD  levothyroxine  (SYNTHROID ) 75 MCG tablet TAKE 1 TABLET BY MOUTH EVERY DAY BEFORE BREAKFAST 09/09/24   [name] Arlyss RAMAN, MD  LUMIGAN  0.01 % SOLN Place 1 drop into both eyes at bedtime. 12/05/23   [provider]  memantine  (NAMENDA ) 10 MG tablet TAKE 1 TABLET BY MOUTH TWICE A DAY 07/25/24   [name] Arlyss RAMAN, MD  nitrofurantoin  (MACRODANTIN ) 100 MG capsule TAKE 1 CAPSULE BY MOUTH EVERY DAY 07/30/24   [name] Arlyss RAMAN, MD  nitroGLYCERIN  (NITROSTAT ) 0.4 MG SL tablet Place 1 tablet (0.4 mg total) under the tongue every 5 (five) minutes as needed for chest pain. 06/21/24   Croitoru, Mihai, MD  omeprazole  (PRILOSEC) 40 MG capsule TAKE 1 CAPSULE (40 MG TOTAL) BY MOUTH DAILY. 04/14/24   Craig Alan SAUNDERS, PA-C  solifenacin  (VESICARE ) 5 MG tablet Take 1 tablet (5 mg total) by mouth daily. 02/11/24   [name] Arlyss RAMAN, MD    Physical Exam: Vitals:   09/11/24 2045 09/11/24 2115 09/11/24 2130  09/11/24 2145  BP: (!) 153/81 (!) 159/84 (!) 166/79 (!) 157/73  Pulse: 82 87 89 87  Resp: 18 17 20 18   Temp:      TempSrc:      SpO2: 100% 100% 100% 100%  Weight:      Height:         Constitutional: NAD, no pallor or diaphoresis   Eyes: PERTLA, lids and conjunctivae normal ENMT: Mucous membranes are moist. Posterior pharynx clear of any exudate or lesions.   Neck: supple, no masses  Respiratory: no wheezing, no crackles. No accessory muscle use.  Cardiovascular: S1 & S2 heard, regular rate and rhythm. Bilateral lower extremity edema.  Abdomen: No tenderness, soft. Bowel sounds active.  Musculoskeletal: no clubbing / cyanosis. No joint deformity upper and lower extremities.   Skin: no significant rashes, lesions, ulcers. Warm, dry, well-perfused. Neurologic: CN 2-12 grossly intact. Moving all extremities. Alert and oriented to person, place, and situation.  Psychiatric: Pleasant. Cooperative.    Labs and Imaging on Admission: I have personally reviewed following labs and imaging studies  CBC: Recent Labs  Lab 09/09/24 1629 09/11/24 1832  WBC 5.6 4.5  NEUTROABS 1.6  --   HGB 12.5 10.4*  HCT 37.6 32.4*  MCV 87.4 89.3  PLT 342.0 296   Basic Metabolic Panel: Recent Labs  Lab 09/09/24 1629 09/11/24 1832  NA 143 141  K 3.8 3.4*  CL 105 107  CO2 31 21*  GLUCOSE 93 99  BUN 11 10  CREATININE 0.92 1.13*  CALCIUM  9.1 8.1*   GFR: Estimated Creatinine Clearance: 39 mL/min (A) (by C-G formula based on SCr of 1.13 mg/dL (H)). Liver Function Tests: Recent Labs  Lab 09/09/24 1629 09/11/24 1832  AST 18 24  ALT 11 10  ALKPHOS 49 40  BILITOT 0.5 0.4  PROT 6.3 5.4*  ALBUMIN 3.9 2.8*  No results for input(s): LIPASE, AMYLASE in the last 168 hours. No results for input(s): AMMONIA in the last 168 hours. Coagulation Profile: No results for input(s): INR, PROTIME in the last 168 hours. Cardiac Enzymes: No results for input(s): CKTOTAL, CKMB, CKMBINDEX,  TROPONINI in the last 168 hours. BNP (last 3 results) No results for input(s): PROBNP in the last 8760 hours. HbA1C: Recent Labs    09/09/24 1629  HGBA1C 6.4   CBG: No results for input(s): GLUCAP in the last 168 hours. Lipid Profile: Recent Labs    09/09/24 1629  CHOL 150  HDL 67.50  LDLCALC 61  TRIG 107.0  CHOLHDL 2   Thyroid  Function Tests: Recent Labs    09/09/24 1629  TSH 1.14   Anemia Panel: Recent Labs    09/09/24 1629  VITAMINB12 944*   Urine analysis:    Component Value Date/Time   COLORURINE YELLOW 09/11/2024 2109   APPEARANCEUR HAZY (A) 09/11/2024 2109   LABSPEC 1.008 09/11/2024 2109   PHURINE 6.0 09/11/2024 2109   GLUCOSEU NEGATIVE 09/11/2024 2109   GLUCOSEU NEGATIVE 02/14/2022 1550   HGBUR NEGATIVE 09/11/2024 2109   HGBUR negative 07/16/2010 1521   BILIRUBINUR NEGATIVE 09/11/2024 2109   BILIRUBINUR neg 03/10/2016 1447   KETONESUR NEGATIVE 09/11/2024 2109   PROTEINUR NEGATIVE 09/11/2024 2109   UROBILINOGEN 0.2 02/14/2022 1550   NITRITE NEGATIVE 09/11/2024 2109   LEUKOCYTESUR LARGE (A) 09/11/2024 2109   Sepsis Labs: @LABRCNTIP (procalcitonin:4,lacticidven:4) )No results found for this or any previous visit (from the past 240 hours).   Radiological Exams on Admission: DG Chest Portable 1 View Result Date: 09/11/2024 EXAM: 1 VIEW(S) XRAY OF THE CHEST 11/22/2022 COMPARISON: None available. CLINICAL HISTORY: syncope. Per chart - Patient BIB [facility] from home after witnessed syncopal episode, immediately after patient 50/palp, GCS 9, no HR compensation d/t beta blockers, HR remained 70. Improved to GCS 15 feeling lethargic shortly after, EKG unremarkable, EMS gave ; 400mL NS. Family reports this has happened before and patient VS were stable at PCP 2 days ago. ; During event patient states she was sitting in a chair, playing with the cat. FINDINGS: LUNGS AND PLEURA: No focal pulmonary opacity. No pulmonary edema. No pleural effusion. No  pneumothorax. HEART AND MEDIASTINUM: No acute abnormality of the cardiac and mediastinal silhouettes. VASCULATURE: Thoracic atherosclerosis. BONES AND SOFT TISSUES: No acute osseous abnormality. IMPRESSION: 1. No acute cardiopulmonary process Electronically signed by: Pinkie Pebbles MD 09/11/2024 07:04 PM EDT RP Workstation: [id number]    EKG: Independently reviewed. Sinus rhythm.   Assessment/Plan   1. Syncope  - Description most suggestive of vasovagal event but she does not have any history of vasovagal syncope and has risk factors for cardiogenic syncope  - Check orthostatic vitals, continue cardiac monitoring, check echocardiogram    2. CAD  - No recent angina   - Continue Coreg  and Imdur    3. Hypertension  - Coreg     4. Hypokalemia  - Replacing    5. Anemia  - Hgb is 10.4, down from 12.5 two days ago  - No overt bleeding, will repeat CBC in am     6. Memory loss  - Continue Namenda , use delirium precautions    7. CKD 3A  - Appears close to baseline - Renally-dose medications    8. Type 2 DM  - Diet-controlled at home  - Monitor, use low-intensity SSI if needed    9. Hypothyroidism  - Synthroid     DVT prophylaxis: Lovenox   Code Status: Full  Level of  Care: Level of care: Telemetry Cardiac Family Communication: Daughter at bedside  Disposition Plan:  Patient is from: Home  Anticipated d/c is to: Home  Anticipated d/c date is: 10/16 or 09/13/24  Patient currently: Pending orthostatic vitals, cardiac monitoring, and echocardiogram  Consults called: None  Admission status: Observation     Kelly GORMAN Sprinkles, MD [facility]  09/11/2024, 11:01 PM

## 2024-09-12 ENCOUNTER — Other Ambulatory Visit: Payer: Self-pay | Admitting: Cardiology

## 2024-09-12 ENCOUNTER — Observation Stay (HOSPITAL_COMMUNITY)

## 2024-09-12 DIAGNOSIS — I10 Essential (primary) hypertension: Secondary | ICD-10-CM | POA: Diagnosis not present

## 2024-09-12 DIAGNOSIS — R413 Other amnesia: Secondary | ICD-10-CM | POA: Diagnosis not present

## 2024-09-12 DIAGNOSIS — R55 Syncope and collapse: Secondary | ICD-10-CM

## 2024-09-12 DIAGNOSIS — I25118 Atherosclerotic heart disease of native coronary artery with other forms of angina pectoris: Secondary | ICD-10-CM | POA: Diagnosis not present

## 2024-09-12 DIAGNOSIS — R569 Unspecified convulsions: Secondary | ICD-10-CM | POA: Diagnosis not present

## 2024-09-12 DIAGNOSIS — E876 Hypokalemia: Secondary | ICD-10-CM | POA: Diagnosis not present

## 2024-09-12 DIAGNOSIS — N1831 Chronic kidney disease, stage 3a: Secondary | ICD-10-CM | POA: Diagnosis not present

## 2024-09-12 DIAGNOSIS — E039 Hypothyroidism, unspecified: Secondary | ICD-10-CM | POA: Diagnosis not present

## 2024-09-12 DIAGNOSIS — R4020 Unspecified coma: Secondary | ICD-10-CM

## 2024-09-12 LAB — CBC
HCT: 35.6 % — ABNORMAL LOW (ref 36.0–46.0)
Hemoglobin: 11.8 g/dL — ABNORMAL LOW (ref 12.0–15.0)
MCH: 29.1 pg (ref 26.0–34.0)
MCHC: 33.1 g/dL (ref 30.0–36.0)
MCV: 87.7 fL (ref 80.0–100.0)
Platelets: 313 K/uL (ref 150–400)
RBC: 4.06 MIL/uL (ref 3.87–5.11)
RDW: 14.1 % (ref 11.5–15.5)
WBC: 5.5 K/uL (ref 4.0–10.5)
nRBC: 0 % (ref 0.0–0.2)

## 2024-09-12 LAB — BASIC METABOLIC PANEL WITH GFR
Anion gap: 10 (ref 5–15)
BUN: 14 mg/dL (ref 8–23)
CO2: 24 mmol/L (ref 22–32)
Calcium: 8.8 mg/dL — ABNORMAL LOW (ref 8.9–10.3)
Chloride: 106 mmol/L (ref 98–111)
Creatinine, Ser: 0.91 mg/dL (ref 0.44–1.00)
GFR, Estimated: >60 mL/min (ref >60)
Glucose, Bld: 86 mg/dL (ref 70–99)
Potassium: 3.9 mmol/L (ref 3.5–5.1)
Sodium: 140 mmol/L (ref 135–145)

## 2024-09-12 LAB — CBG MONITORING, ED
Glucose-Capillary: 88 mg/dL (ref 70–99)
Glucose-Capillary: 106 mg/dL — ABNORMAL HIGH (ref 70–99)

## 2024-09-12 LAB — ECHOCARDIOGRAM COMPLETE
Height: 65 in
Weight: 2656.03 [oz_av]

## 2024-09-12 LAB — TROPONIN I (HIGH SENSITIVITY): Troponin I (High Sensitivity): 5 ng/L (ref <18)

## 2024-09-12 LAB — MAGNESIUM: Magnesium: 1.6 mg/dL — ABNORMAL LOW (ref 1.7–2.4)

## 2024-09-12 MED ORDER — PERFLUTREN LIPID MICROSPHERE
1.0000 mL | INTRAVENOUS | Status: AC | PRN
  Administered 2024-09-12: 4 mL via INTRAVENOUS

## 2024-09-12 MED ORDER — LATANOPROST 0.005 % OP SOLN
1.0000 [drp] | Freq: Every day | OPHTHALMIC | Status: DC
  Filled 2024-09-12: qty 2.5

## 2024-09-12 MED ORDER — AMLODIPINE BESYLATE 10 MG PO TABS
10.0000 mg | ORAL_TABLET | Freq: Every day | ORAL | Status: DC
  Administered 2024-09-12: 10 mg via ORAL
  Filled 2024-09-12: qty 1

## 2024-09-12 MED ORDER — AMITRIPTYLINE HCL 10 MG PO TABS
25.0000 mg | ORAL_TABLET | Freq: Every evening | ORAL | Status: DC | PRN
Start: 2024-09-12 — End: 2024-09-12
  Filled 2024-09-12: qty 1
  Filled 2024-09-12: qty 2.5

## 2024-09-12 MED ORDER — VITAMIN D (ERGOCALCIFEROL) 1.25 MG (50000 UNIT) PO CAPS
50000.0000 [IU] | ORAL_CAPSULE | ORAL | Status: DC
  Administered 2024-09-12: 50000 [IU] via ORAL
  Filled 2024-09-12: qty 1

## 2024-09-12 MED ORDER — AMITRIPTYLINE HCL 50 MG PO TABS: 25.0000 mg | ORAL_TABLET | Freq: Every day | ORAL | Status: DC

## 2024-09-12 MED ORDER — MAGNESIUM SULFATE 2 GM/50ML IV SOLN
2.0000 g | Freq: Once | INTRAVENOUS | Status: AC
  Administered 2024-09-12: 2 g via INTRAVENOUS
  Filled 2024-09-12: qty 50

## 2024-09-12 MED ORDER — LABETALOL HCL 5 MG/ML IV SOLN: 10.0000 mg | INTRAVENOUS | Status: DC | PRN

## 2024-09-12 NOTE — Procedures (Signed)
 Patient Name: Iriana Artley  MRN: [id number]  Epilepsy Attending: Arlin MALVA Krebs  Referring Physician/Provider: Kathrin Mignon DASEN, MD  Date: 09/12/2024 Duration: 28.16 mins  Patient history: 83yo f who presents after a witnessed loss of consciousness. EEG to evaluate for seizure  Level of alertness: Awake  AEDs during EEG study: None  Technical aspects: This EEG study was done with scalp electrodes positioned according to the 10-20 International system of electrode placement. Electrical activity was reviewed with band pass filter of 1-70Hz , sensitivity of 7 uV/mm, display speed of 84mm/sec with a 60Hz  notched filter applied as appropriate. EEG data were recorded continuously and digitally stored.  Video monitoring was available and reviewed as appropriate.  Description: The posterior dominant rhythm consists of 8-9 Hz activity of moderate voltage (25-35 uV) seen predominantly in posterior head regions, symmetric and reactive to eye opening and eye closing. Physiologic photic driving was not seen during photic stimulation.  Hyperventilation was not performed.     IMPRESSION: This study is within normal limits. No seizures or epileptiform discharges were seen throughout the recording.  A normal interictal EEG does not exclude the diagnosis of epilepsy.   Maizie Garno O Derrius Furtick

## 2024-09-12 NOTE — Progress Notes (Signed)
 Ordering a 2 week live Zio monitor for syncope. Dr. Francyne to read

## 2024-09-12 NOTE — Progress Notes (Signed)
 EEG complete. Results pending.  ?

## 2024-09-12 NOTE — Progress Notes (Signed)
 OT Cancellation Note  Patient Details Name: Kelly Kennedy MRN: [id number] DOB: January 14, 1941   Cancelled Treatment:    Reason Eval/Treat Not Completed: Patient not medically ready (SBP >200 with PT, OT to f/u for evaluation once BP is controlled)  [name] 09/12/2024, 11:14 AM

## 2024-09-12 NOTE — Discharge Summary (Signed)
 Physician Discharge Summary  Kelly Kennedy FMW:[id number] DOB: 10-14-1941 DOA: 09/11/2024  PCP: Kelly Arlyss RAMAN, MD  Admit date: 09/11/2024 Discharge date: 09/12/24  Admitted From: Home Disposition: Home Recommendations for Outpatient Follow-up:  Outpatient follow-up with PCP in 1 to 2 weeks Cardiology to arrange outpatient Zio patch to exclude arrhythmia contributing to LOC Patient is at risk for polypharmacy on significant dose of amitriptyline .  Reassess need Also recommend reassessing need for ongoing use of nitrofurantoin /Macrobid  Check CMP and CBC at follow-up Please follow up on the following pending results: None  Home Health: HH PT/OT Equipment/Devices: None  Discharge Condition: Stable CODE STATUS: Full code Diet Orders (From admission, onward)     Start     Ordered   09/11/24 2243  Diet regular Room service appropriate? Yes; Fluid consistency: Thin  Diet effective now       Question Answer Comment  Room service appropriate? Yes   Fluid consistency: Thin      09/11/24 2243             Contact information for follow-up providers     Kelly Arlyss RAMAN, MD. Schedule an appointment as soon as possible for a visit in 1 week(s).   Specialty: Family Medicine Contact information: [address] [place] [address] [phone]              Contact information for after-discharge care     Home Medical Care     [facility]) .   Service: Home Health Services Contact information: [address] [place]  [address] [phone]                     Hospital course 83 year old F with PMH of CAD, HTN, HLD, diet-controlled DM-2, CKD-3A, hypothyroidism, vitamin D deficiency and major cognitive impairment presenting with sudden with denies LOC while seated, and admitted with working diagnosis of syncope.  Reportedly sitting in chair talking with several family members and when she  suddenly lost consciousness and hit her head slumped forward with abnormal breathing pattern.  She was somewhat lethargic with SBP in 50s when EMS arrived.  She was given 400 mL NS and brought to ED.  Seems like third episode in the last 1 year.   In ED, BP 145/71.  Other vital stable.  100% on RA.  K3.4. Cr 1.13.  Hgb 10.4 (was 12.52 days prior).  Serial troponin negative.  UA with pyuria and rare bacteria.  CXR, twelve-lead EKG, CXR without acute finding.  EKG normal sinus rhythm.    Further workup including echocardiogram, EEG and overnight telemetry without significant finding.  Patient's blood pressure improved after resuming home antihypertensive meds and adding amlodipine .  Of note, patient is on high-dose amitriptyline  and daily nitrofurantoin .  I have discussed my concern with patient and patient's daughter at bedside.  They would like to discuss this with PCP.  Patient is encouraged to maintain good hydration.  Cardiology consulted to arrange outpatient Zio patch to exclude arrhythmia.  Home health PT/OT ordered as recommended by therapy.  See individual problem list below for more.   Problems addressed during this hospitalization Transient LOC: Recurrent episode.  About 3 episodes in 1 year.  Reportedly hypotensive with SBP to 50s when EMS arrived.  She is on Coreg  and Imdur  at home.  Otherwise, basic labs, UA, serial troponin twelve-lead EKG and EEG unrevealing.  Orthostatic vitals negative today.  TTE without significant finding.  At  risk for polypharmacy on significant dose of Elavil .  She is also on Macrobid .  Blood pressure improved. -Continue home Coreg  and Imdur  -Cardiology to arrange outpatient Zio patch.   History of CAD: No chest pain or anginal symptoms.  Serial troponin and EKG unrevealing. -Continue home Coreg  and Imdur    Uncontrolled hypertension: Improved after resuming home Coreg  and Imdur .  Also received amlodipine  -Continue home Imdur  and Coreg  -Reassess blood  pressure and adjust as appropriate   Cognitive impairment without behavioral disturbance: Send MMSE 18 out of 30.  Currently awake and alert and oriented to self and place but not time. -Continue home Namenda .   Hypokalemia/Magnesium : Hypokalemia resolved.  Hypomagnesemia replenished prior to discharge   Normocytic anemia: H&H relatively stable.  No report of melena or hematochezia. - Recheck CBC at follow-up.   Diet controlled DM-2: A1c 6.4% on 10/13.   Hypothyroidism: TSH normal on 11/13. - Continue home Synthroid     Vitamin D deficiency: Vitamin D level <7.0 on 11/13.  Started on vitamin D supplementation by PCP but has not picked prescription yet.  Received vitamin D in house. - Advised to pick up prescriptions from pharmacy  Body mass index is 27.62 kg/m.           Consultations: Cardiology for outpatient Zio patch  Time spent 35  minutes  Vital signs Vitals:   09/12/24 1145 09/12/24 1235 09/12/24 1530 09/12/24 1536  BP:  (!) 180/98 (!) 143/89 (!) 152/78  Pulse:  75 74   Temp:  98.3 F (36.8 C) 98.4 F (36.9 C)   Resp: 12     Height:      Weight:      SpO2:  100% 98%   TempSrc:   Oral   BMI (Calculated):         Discharge exam  GENERAL: No apparent distress.  Nontoxic. HEENT: MMM.  Vision and hearing grossly intact.  NECK: Supple.  No apparent JVD.  RESP:  No IWOB.  Fair aeration bilaterally. CVS:  RRR. Heart sounds normal.  ABD/GI/GU: BS+. Abd soft, NTND.  MSK/EXT:  Moves extremities. No apparent deformity. No edema.  SKIN: no apparent skin lesion or wound NEURO: AA.  Oriented to self and place but not time.  Follows commands.  Clear speech.  No facial asymmetry.  Weakness with abduction and extension in right shoulder but good strength with flexion and extension and elbow .  Normal grip strength as well.  Light sensation intact in all dermatomes. PSYCH: Calm. Normal affect.   Discharge Instructions Discharge Instructions     Discharge instructions    Complete by: As directed    It has been a pleasure taking care of you!  You were hospitalized due to transient loss of consciousness.  After the tests we have run, it is unclear what caused your symptoms.  Note that you are on a very high dose of amitriptyline  that has a lot of side effects.  We strongly recommend you talk to your prescriber about this medication and nitrofurantoin  which also has a lot of side effects.  We also recommend you maintain good hydration and take your medications as prescribed.  Cardiology will arrange outpatient heart monitor to exclude possible abnormal heart rhythm that might contribute to your symptoms.  Follow-up with your primary care doctor in 1 to 2 weeks or sooner if needed.  You have received first dose of vitamin D in the hospital.  Please pick your prescription for vitamin D and start taking next week.  Take care,   Increase activity slowly   Complete by: As directed       Allergies as of 09/12/2024       Reactions   Ace Inhibitors Swelling   H/o lip swelling with change in losartan  pill   Angiotensin Receptor Blockers Swelling   H/o lip swelling with change in losartan  pill   Statins Anaphylaxis   myalgias   Donepezil  Other (See Comments)   Nightmares   Glipizide  Other (See Comments)   unstready when on med.     Iron  Other (See Comments)   Bloating with oral iron    Sulfa  Antibiotics Other (See Comments)   Intolerant of sulfa  but able to tolerate trimethoprim    Flonase  [fluticasone  Propionate] Other (See Comments)   Intolerant of smell   Niacin Rash   Red Dye #40 (allura Red) Rash        Medication List     TAKE these medications    amitriptyline  50 MG tablet Commonly known as: ELAVIL  Take 1-2 tablets (50-100 mg total) by mouth at bedtime as needed (dispense w/o red dye). for sleep What changed:  how much to take when to take this   carvedilol  12.5 MG tablet Commonly known as: COREG  Take 1 tablet (12.5 mg total) by  mouth 2 (two) times daily.   cholestyramine  4 g packet Commonly known as: QUESTRAN  Take 1 packet (4 g total) by mouth 3 (three) times daily as needed.   cyanocobalamin  1000 MCG/ML injection Commonly known as: VITAMIN B12 Inject 1 mL (1,000 mcg total) into the muscle every 30 (thirty) days.   diclofenac  Sodium 1 % Gel Commonly known as: VOLTAREN  Apply 2 g topically 4 (four) times daily as needed.   isosorbide  mononitrate 30 MG 24 hr tablet Commonly known as: IMDUR  Take 1 tablet (30 mg total) by mouth daily.   levothyroxine  75 MCG tablet Commonly known as: SYNTHROID  TAKE 1 TABLET BY MOUTH EVERY DAY BEFORE BREAKFAST   Lumigan  0.01 % Soln Generic drug: bimatoprost  Place 1 drop into both eyes at bedtime.   memantine  10 MG tablet Commonly known as: NAMENDA  TAKE 1 TABLET BY MOUTH TWICE A DAY   nitrofurantoin  100 MG capsule Commonly known as: MACRODANTIN  TAKE 1 CAPSULE BY MOUTH EVERY DAY   nitroGLYCERIN  0.4 MG SL tablet Commonly known as: NITROSTAT  Place 1 tablet (0.4 mg total) under the tongue every 5 (five) minutes as needed for chest pain.   omeprazole  40 MG capsule Commonly known as: PRILOSEC TAKE 1 CAPSULE (40 MG TOTAL) BY MOUTH DAILY.   Repatha  SureClick 140 MG/ML Soaj Generic drug: Evolocumab  ADMINISTER 1 ML UNDER THE SKIN EVERY 14 DAYS   solifenacin  5 MG tablet Commonly known as: VESICARE  Take 1 tablet (5 mg total) by mouth daily.   Vitamin D (Ergocalciferol) 1.25 MG (50000 UNIT) Caps capsule Commonly known as: DRISDOL Take 1 capsule by mouth once weekly for 12 weeks.         Procedures/Studies:   ECHOCARDIOGRAM COMPLETE Result Date: 09/12/2024    ECHOCARDIOGRAM REPORT   Patient Name:   MARIENA MEARES Grochowski Date of Exam: 09/12/2024 Medical Rec #:  [id number]            Height:       65.0 in Accession #:    [id number]           Weight:       166.0 lb Date of Birth:  Apr 23, 1941            BSA:  1.828 m Patient Age:    83 years             BP:            180/98 mmHg Patient Gender: F                    HR:           73 bpm. Exam Location:  Inpatient Procedure: 2D Echo, Cardiac Doppler and Color Doppler (Both Spectral and Color            Flow Doppler were utilized during procedure). Indications:    Syncope  History:        Patient has prior history of Echocardiogram examinations, most                 recent 11/23/2022. Angina and Previous Myocardial Infarction,                 Arrythmias:PVC, Signs/Symptoms:Syncope, Edema and Murmur; Risk                 Factors:Hypertension, Diabetes and Former Smoker.  Sonographer:    VALENTE, ADAM Referring Phys: [id number] TIMOTHY S OPYD  Sonographer Comments: Suboptimal apical window. Image acquisition challenging due to breast implants. IMPRESSIONS  1. Left ventricular ejection fraction, by estimation, is 55 to 60%. The left ventricle has normal function. The left ventricle has no regional wall motion abnormalities. Left ventricular diastolic parameters are consistent with Grade I diastolic dysfunction (impaired relaxation).  2. Right ventricular systolic function is mildly reduced. The right ventricular size is normal.  3. The mitral valve is normal in structure. Mild mitral valve regurgitation. No evidence of mitral stenosis.  4. The aortic valve is normal in structure. Aortic valve regurgitation is not visualized. Aortic valve sclerosis/calcification is present, without any evidence of aortic stenosis.  5. The inferior vena cava is normal in size with greater than 50% respiratory variability, suggesting right atrial pressure of 3 mmHg. FINDINGS  Left Ventricle: Left ventricular ejection fraction, by estimation, is 55 to 60%. The left ventricle has normal function. The left ventricle has no regional wall motion abnormalities. The left ventricular internal cavity size was normal in size. There is  no left ventricular hypertrophy. Left ventricular diastolic parameters are consistent with Grade I diastolic dysfunction  (impaired relaxation). Right Ventricle: The right ventricular size is normal. No increase in right ventricular wall thickness. Right ventricular systolic function is mildly reduced. Left Atrium: Left atrial size was normal in size. Right Atrium: Right atrial size was normal in size. Pericardium: There is no evidence of pericardial effusion. Mitral Valve: The mitral valve is normal in structure. Mild mitral valve regurgitation. No evidence of mitral valve stenosis. Tricuspid Valve: The tricuspid valve is normal in structure. Tricuspid valve regurgitation is not demonstrated. No evidence of tricuspid stenosis. Aortic Valve: The aortic valve is normal in structure. Aortic valve regurgitation is not visualized. Aortic valve sclerosis/calcification is present, without any evidence of aortic stenosis. Pulmonic Valve: The pulmonic valve was normal in structure. Pulmonic valve regurgitation is not visualized. No evidence of pulmonic stenosis. Aorta: The aortic root is normal in size and structure. Venous: The inferior vena cava is normal in size with greater than 50% respiratory variability, suggesting right atrial pressure of 3 mmHg. IAS/Shunts: No atrial level shunt detected by color flow Doppler.  RIGHT VENTRICLE RV Basal diam:  2.90 cm RV Mid diam:    2.40 cm LEFT ATRIUM  Index        RIGHT ATRIUM           Index LA Vol (A4C): 56.7 ml 31.03 ml/m  RA Area:     12.60 cm                                    RA Volume:   30.60 ml  16.74 ml/m Dub Tobb DO Electronically signed by Dub Huntsman DO Signature Date/Time: 09/12/2024/4:31:49 PM    Final    EEG adult Result Date: 09/12/2024 Shelton Arlin KIDD, MD     09/12/2024 11:25 AM Patient Name: Kelly Kennedy MRN: [id number] Epilepsy Attending: Arlin KIDD Shelton Referring Physician/Provider: Kathrin Mignon DASEN, MD Date: 09/12/2024 Duration: 28.16 mins Patient history: 83yo f who presents after a witnessed loss of consciousness. EEG to evaluate for seizure Level of  alertness: Awake AEDs during EEG study: None Technical aspects: This EEG study was done with scalp electrodes positioned according to the 10-20 International system of electrode placement. Electrical activity was reviewed with band pass filter of 1-70Hz , sensitivity of 7 uV/mm, display speed of 36mm/sec with a 60Hz  notched filter applied as appropriate. EEG data were recorded continuously and digitally stored.  Video monitoring was available and reviewed as appropriate. Description: The posterior dominant rhythm consists of 8-9 Hz activity of moderate voltage (25-35 uV) seen predominantly in posterior head regions, symmetric and reactive to eye opening and eye closing. Physiologic photic driving was not seen during photic stimulation.  Hyperventilation was not performed.   IMPRESSION: This study is within normal limits. No seizures or epileptiform discharges were seen throughout the recording. A normal interictal EEG does not exclude the diagnosis of epilepsy. Arlin KIDD Shelton   DG Chest Portable 1 View Result Date: 09/11/2024 EXAM: 1 VIEW(S) XRAY OF THE CHEST 11/22/2022 COMPARISON: None available. CLINICAL HISTORY: syncope. Per chart - Patient BIB [facility] from home after witnessed syncopal episode, immediately after patient 50/palp, GCS 9, no HR compensation d/t beta blockers, HR remained 70. Improved to GCS 15 feeling lethargic shortly after, EKG unremarkable, EMS gave ; 400mL NS. Family reports this has happened before and patient VS were stable at PCP 2 days ago. ; During event patient states she was sitting in a chair, playing with the cat. FINDINGS: LUNGS AND PLEURA: No focal pulmonary opacity. No pulmonary edema. No pleural effusion. No pneumothorax. HEART AND MEDIASTINUM: No acute abnormality of the cardiac and mediastinal silhouettes. VASCULATURE: Thoracic atherosclerosis. BONES AND SOFT TISSUES: No acute osseous abnormality. IMPRESSION: 1. No acute cardiopulmonary process Electronically signed by:  Pinkie Pebbles MD 09/11/2024 07:04 PM EDT RP Workstation: [id number]       The results of significant diagnostics from this hospitalization (including imaging, microbiology, ancillary and laboratory) are listed below for reference.     Microbiology: No results found for this or any previous visit (from the past 240 hours).   Labs:  CBC: Recent Labs  Lab 09/09/24 1629 09/11/24 1832 09/12/24 0441  WBC 5.6 4.5 5.5  NEUTROABS 1.6  --   --   HGB 12.5 10.4* 11.8*  HCT 37.6 32.4* 35.6*  MCV 87.4 89.3 87.7  PLT 342.0 296 313   BMP &GFR Recent Labs  Lab 09/09/24 1629 09/11/24 1832 09/12/24 0441  NA 143 141 140  K 3.8 3.4* 3.9  CL 105 107 106  CO2 31 21* 24  GLUCOSE 93 99 86  BUN 11 10 14  CREATININE 0.92 1.13* 0.91  CALCIUM  9.1 8.1* 8.8*  MG  --   --  1.6*   Estimated Creatinine Clearance: 48.4 mL/min (by C-G formula based on SCr of 0.91 mg/dL). Liver & Pancreas: Recent Labs  Lab 09/09/24 1629 09/11/24 1832  AST 18 24  ALT 11 10  ALKPHOS 49 40  BILITOT 0.5 0.4  PROT 6.3 5.4*  ALBUMIN 3.9 2.8*   No results for input(s): LIPASE, AMYLASE in the last 168 hours. No results for input(s): AMMONIA in the last 168 hours. Diabetic: No results for input(s): HGBA1C in the last 72 hours. Recent Labs  Lab 09/12/24 0719 09/12/24 1141  GLUCAP 88 106*   Cardiac Enzymes: No results for input(s): CKTOTAL, CKMB, CKMBINDEX, TROPONINI in the last 168 hours. No results for input(s): PROBNP in the last 8760 hours. Coagulation Profile: No results for input(s): INR, PROTIME in the last 168 hours. Thyroid  Function Tests: No results for input(s): TSH, T4TOTAL, FREET4, T3FREE, THYROIDAB in the last 72 hours. Lipid Profile: No results for input(s): CHOL, HDL, LDLCALC, TRIG, CHOLHDL, LDLDIRECT in the last 72 hours. Anemia Panel: No results for input(s): VITAMINB12, FOLATE, FERRITIN, TIBC, IRON , RETICCTPCT in the last 72  hours. Urine analysis:    Component Value Date/Time   COLORURINE YELLOW 09/11/2024 2109   APPEARANCEUR HAZY (A) 09/11/2024 2109   LABSPEC 1.008 09/11/2024 2109   PHURINE 6.0 09/11/2024 2109   GLUCOSEU NEGATIVE 09/11/2024 2109   GLUCOSEU NEGATIVE 02/14/2022 1550   HGBUR NEGATIVE 09/11/2024 2109   HGBUR negative 07/16/2010 1521   BILIRUBINUR NEGATIVE 09/11/2024 2109   BILIRUBINUR neg 03/10/2016 1447   KETONESUR NEGATIVE 09/11/2024 2109   PROTEINUR NEGATIVE 09/11/2024 2109   UROBILINOGEN 0.2 02/14/2022 1550   NITRITE NEGATIVE 09/11/2024 2109   LEUKOCYTESUR LARGE (A) 09/11/2024 2109   Sepsis Labs: Invalid input(s): PROCALCITONIN, LACTICIDVEN   SIGNED:  Mignon ONEIDA Bump, MD  [facility] 09/12/2024, 5:01 PM

## 2024-09-12 NOTE — Progress Notes (Signed)
   09/12/24 0900  Orthostatic Lying   BP- Lying 188/85  Pulse- Lying 84  Orthostatic Sitting  BP- Sitting (!) 203/95  Pulse- Sitting 86  Orthostatic Standing at 0 minutes  BP- Standing at 0 minutes (!) 203/104  Pulse- Standing at 0 minutes 88  Orthostatic Standing at 3 minutes  BP- Standing at 3 minutes (!) 204/95  Pulse- Standing at 3 minutes 91     No orthostatic hypotension, very high SBP but no complaints of headache or other S/s. MD and RN notified, PT session limited by elevated SBP.  Auri Jahnke S, PT DPT Acute [facility] Services Secure Chat Preferred  Office [phone]

## 2024-09-12 NOTE — Evaluation (Signed)
 Physical Therapy Evaluation Patient Details Name: Kelly Kennedy MRN: [id number] DOB: 04-18-1941 Today's Date: 09/12/2024  History of Present Illness  83 yo female presents to [facility] on 10/15 with syncopal episode. PMH includes HTN, HLD, DM, OA, depression, L breast cancer, former smoker, CAD, CKDIII, hypothyroidism, memory loss.  Clinical Impression  Pt presents with generalized weakness, impaired dynamic standing balance with history of multiple falls, and decreased activity tolerance vs anticipated baseline. Pt to benefit from acute PT to address deficits. Pt ambulated short hallway distance, limited by SBP >200 during orthostatic vital assessment. PT entered note previously, negative for orthostatic hypotension. Pt is from home alone, states she feels slightly weaker vs baseline. Would benefit from increased support from daughter at d/c, as well as HHPT for conditioning, home fall risk assessment. PT to progress mobility as tolerated, and will continue to follow acutely.          If plan is discharge home, recommend the following: A little help with walking and/or transfers;A little help with bathing/dressing/bathroom   Can travel by private vehicle        Equipment Recommendations None recommended by PT  Recommendations for Other Services       Functional Status Assessment Patient has had a recent decline in their functional status and demonstrates the ability to make significant improvements in function in a reasonable and predictable amount of time.     Precautions / Restrictions Precautions Precautions: Fall Restrictions Weight Bearing Restrictions Per Provider Order: No      Mobility  Bed Mobility Overal bed mobility: Needs Assistance Bed Mobility: Supine to Sit, Sit to Supine     Supine to sit: Supervision, HOB elevated, Used rails Sit to supine: Supervision, HOB elevated, Used rails   General bed mobility comments: increased time and effort, able to perform  without PT assist    Transfers Overall transfer level: Needs assistance Equipment used: None Transfers: Sit to/from Stand Sit to Stand: Contact guard assist           General transfer comment: close guard for safety    Ambulation/Gait Ambulation/Gait assistance: Contact guard assist Gait Distance (Feet): 40 Feet Assistive device: None Gait Pattern/deviations: Step-through pattern, Decreased stride length, Trunk flexed, Shuffle Gait velocity: decr     General Gait Details: slowed, but no physical assist. Cues for upright posture  Stairs            Wheelchair Mobility     Tilt Bed    Modified Rankin (Stroke Patients Only)       Balance Overall balance assessment: Needs assistance, History of Falls Sitting-balance support: No upper extremity supported, Feet supported Sitting balance-Leahy Scale: Good     Standing balance support: No upper extremity supported, During functional activity Standing balance-Leahy Scale: Fair                               Pertinent Vitals/Pain Pain Assessment Pain Assessment: No/denies pain    Home Living Family/patient expects to be discharged to:: Private residence Living Arrangements: Alone Available Help at Discharge: Family;Friend(s);Available PRN/intermittently Type of Home: House Home Access: Stairs to enter Entrance Stairs-Rails: Right Entrance Stairs-Number of Steps: 4   Home Layout: One level Home Equipment: Cane - single Librarian, academic (2 wheels)      Prior Function Prior Level of Function : Independent/Modified Independent;Driving;History of Falls (last six months)             Mobility Comments: endorses  gait without AD       Extremity/Trunk Assessment   Upper Extremity Assessment Upper Extremity Assessment: Defer to OT evaluation    Lower Extremity Assessment Lower Extremity Assessment: Generalized weakness (lymphedema noted bilat, compression socks rolled down to ankles)     Cervical / Trunk Assessment Cervical / Trunk Assessment: Normal  Communication   Communication Communication: No apparent difficulties    Cognition Arousal: Alert Behavior During Therapy: WFL for tasks assessed/performed   PT - Cognitive impairments: History of cognitive impairments                       PT - Cognition Comments: history of memory loss, A&O to self, location, not month or year Following commands: Intact       Cueing Cueing Techniques: Gestural cues, Verbal cues     General Comments General comments (skin integrity, edema, etc.): distal edema all extremities, LEs>UEs, with hx L breast cancer    Exercises     Assessment/Plan    PT Assessment Patient needs continued PT services  PT Problem List Decreased strength;Decreased mobility;Decreased activity tolerance;Decreased balance;Cardiopulmonary status limiting activity;Decreased safety awareness       PT Treatment Interventions DME instruction;Therapeutic activities;Gait training;Therapeutic exercise;Patient/family education;Functional mobility training;Neuromuscular re-education;Stair training;Balance training    PT Goals (Current goals can be found in the Care Plan section)  Acute Rehab PT Goals PT Goal Formulation: With patient Time For Goal Achievement: 09/26/24 Potential to Achieve Goals: Good    Frequency Min 2X/week     Co-evaluation               AM-PAC PT 6 Clicks Mobility  Outcome Measure Help needed turning from your back to your side while in a flat bed without using bedrails?: A Little Help needed moving from lying on your back to sitting on the side of a flat bed without using bedrails?: A Little Help needed moving to and from a bed to a chair (including a wheelchair)?: A Little Help needed standing up from a chair using your arms (e.g., wheelchair or bedside chair)?: A Little Help needed to walk in hospital room?: A Little Help needed climbing 3-5 steps with a  railing? : A Little 6 Click Score: 18    End of Session   Activity Tolerance: Patient limited by fatigue;Treatment limited secondary to medical complications (Comment) (limited by elevated SBP >200) Patient left: in bed;with call bell/phone within reach;Other (comment) (EEG tech present) Nurse Communication: Mobility status PT Visit Diagnosis: Other abnormalities of gait and mobility (R26.89);Muscle weakness (generalized) (M62.81)    Time: 9084-9062 PT Time Calculation (min) (ACUTE ONLY): 22 min   Charges:   PT Evaluation $PT Eval Low Complexity: 1 Low   PT General Charges $$ ACUTE PT VISIT: 1 Visit         Johana RAMAN, PT DPT Acute Rehabilitation Services Secure Chat Preferred  Office [phone]   Mcdonald Reiling E Johna 09/12/2024, 10:32 AM

## 2024-09-12 NOTE — Progress Notes (Signed)
 Transition of Care [facility]) - Inpatient Brief Assessment   Patient Details  Name: Kelly Kennedy MRN: [id number] Date of Birth: 03/11/1941  Transition of Care [facility] CM/SW Contact:    Rosaline JONELLE Joe, RN Phone Number: 09/12/2024, 12:33 PM   Clinical Narrative: Patient admitted from  home with syncopal episode.  The patient lives with sister in the home and plans to return home when stable.  The patient was evaluated by PT/OT and home health was recommended.   I provided the patient with Medicare choice regarding home health and patient did not have a preference.  I called Darleene, RNCM with [facility] and decision for acceptance is pending.  HH Pt/OT placed - MD requested to co-sign.  Patient has DMe at the home including cane and RW.  No other IP Care management needs and patient should discharge home by car with family when stable.   Transition of Care Asessment: Insurance and Status: (P) Insurance coverage has been reviewed Patient has primary care physician: (P) Yes Home environment has been reviewed: (P) from home with sister Prior level of function:: (P) RW - self Prior/Current Home Services: (P) No current home services (DMe at home - RW and Medical laboratory scientific officer) Social Drivers of Health Review: (P) SDOH reviewed interventions complete Readmission risk has been reviewed: (P) Yes Transition of care needs: (P) transition of care needs identified, TOC will continue to follow

## 2024-09-12 NOTE — Telephone Encounter (Signed)
 Placed in your tray

## 2024-09-12 NOTE — Progress Notes (Signed)
 Discharge Nurse Summary: DC order noted per MD. DC RN at bedside with patient. Patient agreeable with discharge plan, dtr at the bedside. AVS printed/reviewed. PIV removed, skin intact. No DME needs. No home/TOC meds. CP/Edu resolved. Telemonitor returned to charging station. All belongings accounted for. Patient wheeled downstairs for discharge by private auto.   Rosario EMERSON Lund, RN

## 2024-09-12 NOTE — Progress Notes (Signed)
 Echocardiogram 2D Echocardiogram has been performed.  Kelly Kennedy 09/12/2024, 3:01 PM

## 2024-09-12 NOTE — ED Notes (Signed)
 Pt assisted to RR with one person assist

## 2024-09-12 NOTE — Telephone Encounter (Signed)
 Please pull a form and I'll sign it.  Thanks.

## 2024-09-12 NOTE — Progress Notes (Signed)
 PROGRESS NOTE  Kelly Kennedy FMW:[id number] DOB: 22-Sep-1941   PCP: [name] Arlyss RAMAN, MD  Patient is from: Home.  Lives with family.  Intermittently uses rolling walker.  DOA: 09/11/2024 LOS: 0  Chief complaints Chief Complaint  Patient presents with   Hypotension     Brief Narrative / Interim history: 83 year old F with PMH of CAD, HTN, HLD, diet-controlled DM-2, CKD-3A, hypothyroidism, vitamin D deficiency and major cognitive impairment presenting with sudden with denies LOC while seated, and admitted with working diagnosis of syncope.  Reportedly sitting in chair talking with several family members and when she suddenly lost consciousness and hit her head slumped forward with abnormal breathing pattern.  She was somewhat lethargic with SBP in 50s when EMS arrived.  She was given 400 mL NS and brought to ED.  Seems like third episode in the last 1 year.  In ED, BP 145/71.  Other vital stable.  100% on RA.  K3.4. Cr 1.13.  Hgb 10.4 (was 12.52 days prior).  Serial troponin negative.  UA with pyuria and rare bacteria.  CXR without acute finding.  EKG normal sinus rhythm.  Echocardiogram ordered and admitted for syncope workup.   Subjective: Seen and examined earlier this morning.  No major events overnight or this morning.  No specific complaint this morning.  Denies chest pain, shortness of breath, palpitation, dizziness, GI or UTI symptoms.  Objective: Vitals:   09/12/24 1115 09/12/24 1130 09/12/24 1145 09/12/24 1235  BP:    (!) 180/98  Pulse:    75  Resp: 14 16 12    Temp:    98.3 F (36.8 C)  TempSrc:      SpO2:    100%  Weight:      Height:        Examination:  GENERAL: No apparent distress.  Nontoxic. HEENT: MMM.  Vision and hearing grossly intact.  NECK: Supple.  No apparent JVD.  RESP:  No IWOB.  Fair aeration bilaterally. CVS:  RRR. Heart sounds normal.  ABD/GI/GU: BS+. Abd soft, NTND.  MSK/EXT:  Moves extremities. No apparent deformity. No edema.  SKIN:  no apparent skin lesion or wound NEURO: AA.  Oriented to self and place but not time.  Follows commands.  Clear speech.  No facial asymmetry.  Weakness with abduction and extension in right shoulder but good strength with flexion and extension and elbow .  Normal grip strength as well.  Light sensation intact in all dermatomes. PSYCH: Calm. Normal affect.   Consultants:  None  Procedures: None  Microbiology summarized: None  Assessment and plan: Transient LOC: Recurrent episode.  About 3 episodes in 1 year.  Reportedly hypotensive with SBP to 50s when EMS arrived.  She is on Coreg  and Imdur  at home.  Otherwise, basic labs, UA, serial troponin twelve-lead EKG and EEG unrevealing.  Orthostatic vitals negative today.  At risk for polypharmacy on significant dose of Elavil .  Currently very hypertensive. -Continue home Coreg  and Imdur  -Add amlodipine  10 mg daily -P.o. hydralazine  as needed -Decreased home Elavil . - Follow echocardiogram - If no explanation echocardiogram, will consult cardiology for heart monitor patient  History of CAD: No chest pain or anginal symptoms.  Serial troponin and EKG unrevealing. -Continue home Coreg  and Imdur   Uncontrolled hypertension: Reportedly hypotensive at home before coming to ED. Now hypertensive.  Due to missed doses of home Coreg  and Imdur . -Continue home Imdur  and Coreg  -Amlodipine  10 mg daily -P.o. hydralazine  as needed  Cognitive impairment without behavioral disturbance: Send MMSE  18 out of 30.  Currently awake and alert and oriented to self and place but not time. -Continue home Namenda . -Reorientation and delirium precaution -Decrease to level due to risk for polypharmacy   Hypokalemia/Magnesium  -Monitor replenish K and Mg as appropriate   Normocytic anemia: H&H relatively stable.  No report of melena or hematochezia. -Check anemia panel   Diet controlled DM-2: A1c 6.4% on 10/13. -Monitor glucose with daily labs  Hypothyroidism:  TSH normal on 11/13. - Continue home Synthroid    Vitamin D deficiency: Vitamin D level <7.0 on 11/13.  Started on vitamin D supplementation by PCP but has not picked prescription yet - Start vitamin D 50,000 international unit weekly  Body mass index is 27.62 kg/m.           DVT prophylaxis:  enoxaparin  (LOVENOX ) injection 40 mg Start: 09/12/24 1000  Code Status: Full code Family Communication: Updated patient's daughter over the phone Level of care: Telemetry Cardiac Status is: Observation The patient remains OBS appropriate and will d/c before 2 midnights.   Final disposition: Home   55 minutes with more than 50% spent in reviewing records, counseling patient/family and coordinating care.   Sch Meds:  Scheduled Meds:  amLODipine   10 mg Oral Daily   carvedilol   12.5 mg Oral BID WC   enoxaparin  (LOVENOX ) injection  40 mg Subcutaneous Q24H   fesoterodine   4 mg Oral Daily   insulin  aspart  0-6 Units Subcutaneous TID WC   isosorbide  mononitrate  30 mg Oral Daily   levothyroxine   75 mcg Oral Q0600   memantine   10 mg Oral BID   sodium chloride  flush  3 mL Intravenous Q12H   Continuous Infusions: PRN Meds:.acetaminophen  **OR** acetaminophen , amitriptyline , labetalol , ondansetron  **OR** ondansetron  (ZOFRAN ) IV, polyethylene glycol  Antimicrobials: Anti-infectives (From admission, onward)    None        I have personally reviewed the following labs and images: CBC: Recent Labs  Lab 09/09/24 1629 09/11/24 1832 09/12/24 0441  WBC 5.6 4.5 5.5  NEUTROABS 1.6  --   --   HGB 12.5 10.4* 11.8*  HCT 37.6 32.4* 35.6*  MCV 87.4 89.3 87.7  PLT 342.0 296 313   BMP &GFR Recent Labs  Lab 09/09/24 1629 09/11/24 1832 09/12/24 0441  NA 143 141 140  K 3.8 3.4* 3.9  CL 105 107 106  CO2 31 21* 24  GLUCOSE 93 99 86  BUN 11 10 14   CREATININE 0.92 1.13* 0.91  CALCIUM  9.1 8.1* 8.8*  MG  --   --  1.6*   Estimated Creatinine Clearance: 48.4 mL/min (by C-G formula  based on SCr of 0.91 mg/dL). Liver & Pancreas: Recent Labs  Lab 09/09/24 1629 09/11/24 1832  AST 18 24  ALT 11 10  ALKPHOS 49 40  BILITOT 0.5 0.4  PROT 6.3 5.4*  ALBUMIN 3.9 2.8*   No results for input(s): LIPASE, AMYLASE in the last 168 hours. No results for input(s): AMMONIA in the last 168 hours. Diabetic: Recent Labs    09/09/24 1629  HGBA1C 6.4   Recent Labs  Lab 09/12/24 0719 09/12/24 1141  GLUCAP 88 106*   Cardiac Enzymes: No results for input(s): CKTOTAL, CKMB, CKMBINDEX, TROPONINI in the last 168 hours. No results for input(s): PROBNP in the last 8760 hours. Coagulation Profile: No results for input(s): INR, PROTIME in the last 168 hours. Thyroid  Function Tests: Recent Labs    09/09/24 1629  TSH 1.14   Lipid Profile: Recent Labs    09/09/24  1629  CHOL 150  HDL 67.50  LDLCALC 61  TRIG 107.0  CHOLHDL 2   Anemia Panel: Recent Labs    09/09/24 1629  VITAMINB12 944*   Urine analysis:    Component Value Date/Time   COLORURINE YELLOW 09/11/2024 2109   APPEARANCEUR HAZY (A) 09/11/2024 2109   LABSPEC 1.008 09/11/2024 2109   PHURINE 6.0 09/11/2024 2109   GLUCOSEU NEGATIVE 09/11/2024 2109   GLUCOSEU NEGATIVE 02/14/2022 1550   HGBUR NEGATIVE 09/11/2024 2109   HGBUR negative 07/16/2010 1521   BILIRUBINUR NEGATIVE 09/11/2024 2109   BILIRUBINUR neg 03/10/2016 1447   KETONESUR NEGATIVE 09/11/2024 2109   PROTEINUR NEGATIVE 09/11/2024 2109   UROBILINOGEN 0.2 02/14/2022 1550   NITRITE NEGATIVE 09/11/2024 2109   LEUKOCYTESUR LARGE (A) 09/11/2024 2109   Sepsis Labs: Invalid input(s): PROCALCITONIN, LACTICIDVEN  Microbiology: No results found for this or any previous visit (from the past 240 hours).  Radiology Studies: EEG adult Result Date: 09/12/2024 Shelton Arlin KIDD, MD     09/12/2024 11:25 AM Patient Name: Cashlynn Yearwood MRN: [id number] Epilepsy Attending: Arlin KIDD Shelton Referring Physician/Provider: Kathrin Mignon DASEN, MD Date: 09/12/2024 Duration: 28.16 mins Patient history: 83yo f who presents after a witnessed loss of consciousness. EEG to evaluate for seizure Level of alertness: Awake AEDs during EEG study: None Technical aspects: This EEG study was done with scalp electrodes positioned according to the 10-20 International system of electrode placement. Electrical activity was reviewed with band pass filter of 1-70Hz , sensitivity of 7 uV/mm, display speed of 58mm/sec with a 60Hz  notched filter applied as appropriate. EEG data were recorded continuously and digitally stored.  Video monitoring was available and reviewed as appropriate. Description: The posterior dominant rhythm consists of 8-9 Hz activity of moderate voltage (25-35 uV) seen predominantly in posterior head regions, symmetric and reactive to eye opening and eye closing. Physiologic photic driving was not seen during photic stimulation.  Hyperventilation was not performed.   IMPRESSION: This study is within normal limits. No seizures or epileptiform discharges were seen throughout the recording. A normal interictal EEG does not exclude the diagnosis of epilepsy. Arlin KIDD Shelton   DG Chest Portable 1 View Result Date: 09/11/2024 EXAM: 1 VIEW(S) XRAY OF THE CHEST 11/22/2022 COMPARISON: None available. CLINICAL HISTORY: syncope. Per chart - Patient BIB [facility] from home after witnessed syncopal episode, immediately after patient 50/palp, GCS 9, no HR compensation d/t beta blockers, HR remained 70. Improved to GCS 15 feeling lethargic shortly after, EKG unremarkable, EMS gave ; 400mL NS. Family reports this has happened before and patient VS were stable at PCP 2 days ago. ; During event patient states she was sitting in a chair, playing with the cat. FINDINGS: LUNGS AND PLEURA: No focal pulmonary opacity. No pulmonary edema. No pleural effusion. No pneumothorax. HEART AND MEDIASTINUM: No acute abnormality of the cardiac and mediastinal silhouettes. VASCULATURE:  Thoracic atherosclerosis. BONES AND SOFT TISSUES: No acute osseous abnormality. IMPRESSION: 1. No acute cardiopulmonary process Electronically signed by: Pinkie Pebbles MD 09/11/2024 07:04 PM EDT RP Workstation: [id number]      Raveen Wieseler T. Tymon Nemetz [facility]  If 7PM-7AM, please contact night-coverage [email or web address] 09/12/2024, 12:56 PM

## 2024-09-12 NOTE — ED Notes (Signed)
 Pt amb with PT in hallway

## 2024-09-13 ENCOUNTER — Telehealth: Payer: Self-pay | Admitting: Family Medicine

## 2024-09-13 ENCOUNTER — Other Ambulatory Visit: Payer: Self-pay

## 2024-09-13 ENCOUNTER — Ambulatory Visit: Attending: Cardiovascular Disease

## 2024-09-13 DIAGNOSIS — E559 Vitamin D deficiency, unspecified: Secondary | ICD-10-CM

## 2024-09-13 DIAGNOSIS — R55 Syncope and collapse: Secondary | ICD-10-CM

## 2024-09-13 NOTE — Progress Notes (Unsigned)
 Enrolled patient for a 14 day Zio ZT monitor to be mailed to patients home   Croitoru to read

## 2024-09-13 NOTE — Telephone Encounter (Unsigned)
 Copied from CRM [id number]. Topic: Clinical - Medication Question >> Sep 13, 2024  3:14 PM Alfonso HERO wrote: Reason for CRM: Prior to discharge the Dr recommended patient to take Amlodipine  but never called it in and the daughter is asking for it to be sent over to the [facility] pharmacy on file. HE also recommended for the patient to take Vitamin D and that hasn't been sent over either.

## 2024-09-13 NOTE — Telephone Encounter (Signed)
 I'll work on the hard copy.  Thanks.

## 2024-09-15 NOTE — Patient Instructions (Signed)
 Visit Information  Thank you for taking time to visit with me today. Please don't hesitate to contact me if I can be of assistance to you before our next scheduled appointment.  Your next care management appointment is by telephone on 10/11/2024 at 1:30 pm  Telephone follow-up in 1 month  Please call the care guide team at [phone] if you need to cancel, schedule, or reschedule an appointment.   Please call the Suicide and Crisis Lifeline: 988 call the [place]  National Suicide Prevention Lifeline: 986-601-2639 or TTY: 484-751-6278 TTY 3517929983) to talk to a trained counselor call 1-800-273-TALK (toll free, 24 hour hotline) go to [facility] [address], [place] [phone]) call 911 if you are experiencing a Mental Health or Behavioral Health Crisis or need someone to talk to.  [name], MSN, RN   [facility], [facility] RN Care Manager Direct Dial: [phone] Fax: [phone]   Syncope, Adult  Syncope is when you pass out or faint for a short time. It is caused by a sudden decrease in blood flow to the brain. This can happen for many reasons. It can sometimes happen when seeing blood, getting a shot (injection), or having pain or strong emotions. Most causes of fainting are not dangerous, but in some cases it can be a sign of a serious medical problem. If you faint, get help right away. Call your local emergency services (911 in the [place] Follow these instructions at home: Watch for any changes in your symptoms. Take these actions to stay safe and help with your symptoms: Knowing when you may be about to faint Signs that you may be about to faint include: Feeling dizzy or light-headed. It may feel like the room is spinning. Feeling weak. Feeling like you may vomit (nauseous). Seeing spots or seeing all white or all black. Having cold, clammy skin. Feeling warm and sweaty. Hearing  ringing in the ears. If you start to feel like you might faint, sit or lie down right away. If sitting, lower your head down between your legs. If lying down, raise (elevate) your feet above the level of your heart. Breathe deeply and steadily. Wait until all of the symptoms are gone. Have someone stay with you until you feel better. Medicines Take over-the-counter and prescription medicines only as told by your doctor. If you are taking blood pressure or heart medicine, sit up and stand up slowly. Spend a few minutes getting ready to sit and then stand. This can help you feel less dizzy. Lifestyle Do not drive, use machinery, or play sports until your doctor says it is okay. Do not drink alcohol . Do not smoke or use any products that contain nicotine or tobacco. If you need help quitting, ask your doctor. Avoid hot tubs and saunas. General instructions Talk with your doctor about your symptoms. You may need to have testing to help find the cause. Drink enough fluid to keep your pee (urine) pale yellow. Avoid standing for a long time. If you must stand for a long time, do movements such as: Moving your legs. Crossing your legs. Flexing and stretching your leg muscles. Squatting. Keep all follow-up visits. Contact a doctor if: You have episodes of near fainting. Get help right away if: You pass out or faint. You hit your head or are injured after fainting. You have any of these symptoms: Fast or uneven heartbeats (palpitations). Pain in your chest, belly, or back. Shortness of breath. You have jerky movements  that you cannot control (seizure). You have a very bad headache. You are confused. You have problems with how you see (vision). You are very weak. You have trouble walking. You are bleeding from your mouth or your butt (rectum). You have black or tarry poop (stool). These symptoms may be an emergency. Get help right away. Call your local emergency services (911 in the  [place] Do not wait to see if the symptoms will go away. Do not drive yourself to the hospital. Summary Syncope is when you pass out or faint for a short time. It is caused by a sudden decrease in blood flow to the brain. Signs that you may be about to faint include feeling dizzy or light-headed, feeling like you may vomit, seeing all white or all black, or having cold, clammy skin. If you start to feel like you might faint, sit or lie down right away. Lower your head if sitting, or raise (elevate) your feet if lying down. Breathe deeply and steadily. Wait until all of the symptoms are gone. This information is not intended to replace advice given to you by your health care provider. Make sure you discuss any questions you have with your health care provider. Document Revised: 03/25/2021 Document Reviewed: 03/25/2021 Elsevier Patient Education  2024 [employer].

## 2024-09-15 NOTE — Patient Outreach (Signed)
 Complex Care Management   Visit Note  09/15/2024  Name:  Kelly Kennedy MRN: [id number] DOB: [birth year]  Situation: Referral received for Complex Care Management related to HTN & Anxiety I obtained verbal consent from Patient.  Visit completed with Patient  on the phone  Background:   Past Medical History:  Diagnosis Date   adenomatous Colon polyps    colonoscopy 8/99, 12/02, 8/06   Arthritis    Bell's palsy    Cancer (HCC)    DCIS left breast   Cataract    Chest pain    uses NTG as needed   Depression    Diabetes mellitus without complication (HCC)    Diverticulosis of colon 06/2005   Ductal carcinoma in situ (DCIS) of left breast    Family history of adverse reaction to anesthesia     my mother didn't wake up so they had to put her on life support for about an hour or more; my sisiter has problems waking up too from anesthesia.   GERD (gastroesophageal reflux disease)    Glaucoma    H/O hiatal hernia    Headache(784.0)    Hyperglycemia    Hyperlipidemia    Hypertension    Hypothyroid    Insomnia    Irritable bowel    Menopausal symptoms    Pericarditis 1980's   Stroke [facility])    TIA (transient ischemic attack)    09/2014    Assessment: Patient Reported Symptoms:  Cognitive Cognitive Status: Struggling with memory recall Cognitive/Intellectual Conditions Management [RPT]: None reported or documented in medical history or problem list   Health Maintenance Behaviors: Annual physical exam  Neurological Neurological Review of Symptoms: No symptoms reported Neurological Management Strategies: Medication therapy, Routine screening  HEENT HEENT Symptoms Reported: Not assessed      Cardiovascular Does patient have uncontrolled Hypertension?: No Cardiovascular Management Strategies: Medication therapy, Routine screening Cardiovascular Comment: ED/OBS for syncope, swelling in ankles, not checking weight or BP getting new cuff  Respiratory Respiratory Symptoms  Reported: No symptoms reported    Endocrine Endocrine Symptoms Reported: Not assessed Is patient diabetic?: No    Gastrointestinal Gastrointestinal Symptoms Reported: Incontinence, No symptoms reported      Genitourinary Genitourinary Symptoms Reported: Incontinence Additional Genitourinary Details: UTI treated in hospital - states no symptoms, aware to call for change in color/odor    Integumentary Integumentary Symptoms Reported: No symptoms reported    Musculoskeletal Musculoskelatal Symptoms Reviewed: Joint pain, Muscle pain, Unsteady gait Additional Musculoskeletal Details: cane prn, neck and chest  - suspect muscle pull seeing orth, fall morning of ED visit Musculoskeletal Management Strategies: Routine screening Falls in the past year?: Yes Number of falls in past year: 2 or more Was there an injury with Fall?: No Fall Risk Category Calculator: 2 Patient Fall Risk Level: Moderate Fall Risk Patient at Risk for Falls Due to: History of fall(s), Impaired balance/gait, Mental status change Fall risk Follow up: Falls evaluation completed, Falls prevention discussed  Psychosocial Psychosocial Symptoms Reported: Sadness - if selected complete PHQ 2-9, Depression - if selected complete PHQ 2-9 Additional Psychological Details: repeorts sadness, anxiety and depression, living situation is ok, would like to continue with LCSW          09/15/2024    PHQ2-9 Depression Screening   Little interest or pleasure in doing things Nearly every day  Feeling down, depressed, or hopeless Nearly every day  PHQ-2 - Total Score 6  Trouble falling or staying asleep, or sleeping too much Nearly every day  Feeling tired or having little energy Nearly every day  Poor appetite or overeating  Not at all  Feeling bad about yourself - or that you are a failure or have let yourself or your family down More than half the days  Trouble concentrating on things, such as reading the newspaper or watching  television Several days  Moving or speaking so slowly that other people could have noticed.  Or the opposite - being so fidgety or restless that you have been moving around a lot more than usual Not at all  Thoughts that you would be better off dead, or hurting yourself in some way Not at all  PHQ2-9 Total Score 15  If you checked off any problems, how difficult have these problems made it for you to do your work, take care of things at home, or get along with other people    Depression Interventions/Treatment Counseling    There were no vitals filed for this visit.  Medications Reviewed Today   Medications were not reviewed in this encounter     Recommendation:   PCP Follow-up  Follow Up Plan:   Telephone follow-up in 1 month  [name], MSN, RN [facility]  [facility], [facility] RN Care Manager Direct Dial: [phone] Fax: [phone]

## 2024-09-16 ENCOUNTER — Telehealth: Payer: Self-pay

## 2024-09-16 MED ORDER — AMLODIPINE BESYLATE 10 MG PO TABS: 10.0000 mg | ORAL_TABLET | Freq: Every day | ORAL | 0 refills | Status: DC

## 2024-09-16 MED ORDER — VITAMIN D (ERGOCALCIFEROL) 1.25 MG (50000 UNIT) PO CAPS: ORAL_CAPSULE | ORAL | 0 refills | Status: DC

## 2024-09-16 NOTE — Telephone Encounter (Signed)
 Spoke with [facility] [address] asking if received vit D rx sent 09/11/24. States they are not showing it and request rx be resent.   E-scribed new rx.  Attempted to contact pt. No answer, vm box not set up. Pls notify pt vit D and amlodipine  rxs sent to pharmacy above.

## 2024-09-16 NOTE — Telephone Encounter (Signed)
 Spoke with pt notifying her the form is ready for pick up. Expresses her thanks and states she will pick up at next OV (09/23/24) with Dr [name].   [Placed form at front office. Made copy to scan.]

## 2024-09-16 NOTE — Telephone Encounter (Signed)
 Please check with pharmacy re: vit D Rx.  E-Prescribing Status: Receipt confirmed by pharmacy (09/11/2024)  Is her BP persistently elevated?  I sent rx for amlodipine  in the meantime.  Thanks.

## 2024-09-16 NOTE — Telephone Encounter (Signed)
 Copied from CRM #[id number]. Topic: Clinical - Home Health Verbal Orders >> Sep 16, 2024 11:35 AM Vena HERO wrote: Caller/Agency: Jenna Bayada [facility] Callback Number: [phone] Service Requested: Physical Therapy Frequency: twice a week for two weeks and then once a week for two weeks Any new concerns about the patient? No, pt seen in hospital for hypotension and uti recently and HH would like to help her get her strength back

## 2024-09-17 NOTE — Telephone Encounter (Signed)
 Spoke with Jenna, PT w/ [facility], informing her Dr [name] is giving verbal orders for services requested for pt. Jenna expresses her thanks for the call.

## 2024-09-17 NOTE — Telephone Encounter (Signed)
 Please give the order.  Thanks.

## 2024-09-17 NOTE — Telephone Encounter (Signed)
 Spoke with pt asking about BP. States she has checked today but last check it was not.   Notified pt rx for both amlodipine  and vit D sent to pharmacy. Pt verbalizes understanding.

## 2024-09-17 NOTE — Telephone Encounter (Signed)
 Noted. Thanks.

## 2024-09-18 ENCOUNTER — Ambulatory Visit

## 2024-09-18 DIAGNOSIS — E538 Deficiency of other specified B group vitamins: Secondary | ICD-10-CM | POA: Diagnosis not present

## 2024-09-18 MED ORDER — CYANOCOBALAMIN 1000 MCG/ML IJ SOLN
1000.0000 ug | Freq: Once | INTRAMUSCULAR | Status: AC
  Administered 2024-09-18: 1000 ug via INTRAMUSCULAR

## 2024-09-18 NOTE — Progress Notes (Signed)
 Per orders of Dr. Anton Blas, injection of B-12 given by Bobbette Sprague in left deltoid. Patient tolerated injection well. Patient will make appointment for 1 month.

## 2024-09-19 ENCOUNTER — Ambulatory Visit

## 2024-09-20 ENCOUNTER — Telehealth: Payer: Self-pay | Admitting: *Deleted

## 2024-09-20 ENCOUNTER — Telehealth: Payer: Self-pay

## 2024-09-20 ENCOUNTER — Encounter: Payer: Self-pay | Admitting: *Deleted

## 2024-09-20 NOTE — Telephone Encounter (Signed)
 Do you want glucometer ordered.  I do not see Dx of CHF in patient's problem list.  Please advise.    Copied from CRM [id number]. Topic: Clinical - Medical Advice >> Sep 20, 2024  8:15 AM Thersia BROCKS wrote: Reason for CRM: home health physical therapist called in , want to confirm if patient has diabetes and looks like she doesnt have a  glucometer question if patient has congested heart failure and what is the diagnosis that leads to bilateral lower extremity  [phone] - JIM PT [facility] vm is confidential

## 2024-09-20 NOTE — Telephone Encounter (Signed)
 She has h/o DM2 but not CHF.  She has h/o BLE noted over the past few years.  Thanks.

## 2024-09-20 NOTE — Patient Instructions (Signed)
 Kelly Kennedy - I am sorry I was unable to reach you today. I work with [name] Arlyss RAMAN, MD and am calling to support your healthcare needs. Please contact me at [phone] at your earliest convenience. I look forward to speaking with you soon.   Thank you,     Mayte Diers, LCSW [place]  [facility], [facility] Licensed Clinical Social Worker  Direct Dial: [phone]

## 2024-09-22 ENCOUNTER — Other Ambulatory Visit: Payer: Self-pay | Admitting: Family Medicine

## 2024-09-23 ENCOUNTER — Encounter: Payer: Self-pay | Admitting: Family Medicine

## 2024-09-23 ENCOUNTER — Ambulatory Visit: Admitting: Family Medicine

## 2024-09-23 ENCOUNTER — Ambulatory Visit (INDEPENDENT_AMBULATORY_CARE_PROVIDER_SITE_OTHER): Admitting: Family Medicine

## 2024-09-23 VITALS — BP 136/84 | HR 77 | Temp 98.8°F | Ht 65.0 in | Wt 166.4 lb

## 2024-09-23 DIAGNOSIS — R55 Syncope and collapse: Secondary | ICD-10-CM

## 2024-09-23 DIAGNOSIS — Z87898 Personal history of other specified conditions: Secondary | ICD-10-CM | POA: Diagnosis not present

## 2024-09-23 MED ORDER — AMITRIPTYLINE HCL 50 MG PO TABS: 25.0000 mg | ORAL_TABLET | Freq: Every evening | ORAL | Status: DC | PRN

## 2024-09-23 NOTE — Progress Notes (Unsigned)
 Inpatient f/u.   She can't operate the zio patch/app if she has symptoms.  Are there other options?   She had finished eating.  Family was there- pt was sitting but had LOC.   No known tonic clonic movements.    D/w pt about vit D def.    I need to see about zio patch.    Trace BLE edema.

## 2024-09-23 NOTE — Patient Instructions (Addendum)
 Go to the lab on the way out.   If you have mychart we'll likely use that to update you.    Take care.  Glad to see you.  Urine sample when possible.  Stop nitrofurantoin .   Cut amitriptyline  back to 1 tab a night.    Check your other meds at home to make sure you don't double up on any meds, especially blood pressure meds.

## 2024-09-24 ENCOUNTER — Encounter: Payer: Self-pay | Admitting: Physical Medicine and Rehabilitation

## 2024-09-24 ENCOUNTER — Other Ambulatory Visit (INDEPENDENT_AMBULATORY_CARE_PROVIDER_SITE_OTHER)

## 2024-09-24 ENCOUNTER — Ambulatory Visit: Admitting: Physical Medicine and Rehabilitation

## 2024-09-24 DIAGNOSIS — G8929 Other chronic pain: Secondary | ICD-10-CM

## 2024-09-24 DIAGNOSIS — M25512 Pain in left shoulder: Secondary | ICD-10-CM

## 2024-09-24 DIAGNOSIS — M5412 Radiculopathy, cervical region: Secondary | ICD-10-CM

## 2024-09-24 DIAGNOSIS — M542 Cervicalgia: Secondary | ICD-10-CM

## 2024-09-24 LAB — CBC WITH DIFFERENTIAL/PLATELET
Basophils Absolute: 0 K/uL (ref 0.0–0.1)
Basophils Relative: 1 % (ref 0.0–3.0)
Eosinophils Absolute: 0 K/uL (ref 0.0–0.7)
Eosinophils Relative: 0.3 % (ref 0.0–5.0)
HCT: 37.2 % (ref 36.0–46.0)
Hemoglobin: 12.4 g/dL (ref 12.0–15.0)
Lymphocytes Relative: 57.7 % — ABNORMAL HIGH (ref 12.0–46.0)
Lymphs Abs: 2.9 K/uL (ref 0.7–4.0)
MCHC: 33.3 g/dL (ref 30.0–36.0)
MCV: 86.8 fl (ref 78.0–100.0)
Monocytes Absolute: 0.6 K/uL (ref 0.1–1.0)
Monocytes Relative: 11.1 % (ref 3.0–12.0)
Neutro Abs: 1.5 K/uL (ref 1.4–7.7)
Neutrophils Relative %: 29.9 % — ABNORMAL LOW (ref 43.0–77.0)
Platelets: 334 K/uL (ref 150.0–400.0)
RBC: 4.28 Mil/uL (ref 3.87–5.11)
RDW: 14.3 % (ref 11.5–15.5)
WBC: 5 K/uL (ref 4.0–10.5)

## 2024-09-24 LAB — COMPREHENSIVE METABOLIC PANEL WITH GFR
ALT: 11 U/L (ref 0–35)
AST: 17 U/L (ref 0–37)
Albumin: 3.9 g/dL (ref 3.5–5.2)
Alkaline Phosphatase: 52 U/L (ref 39–117)
BUN: 13 mg/dL (ref 6–23)
CO2: 27 meq/L (ref 19–32)
Calcium: 9.1 mg/dL (ref 8.4–10.5)
Chloride: 105 meq/L (ref 96–112)
Creatinine, Ser: 0.85 mg/dL (ref 0.40–1.20)
GFR: 63.71 mL/min (ref >60.00)
Glucose, Bld: 90 mg/dL (ref 70–99)
Potassium: 4.2 meq/L (ref 3.5–5.1)
Sodium: 140 meq/L (ref 135–145)
Total Bilirubin: 0.4 mg/dL (ref 0.2–1.2)
Total Protein: 6.6 g/dL (ref 6.0–8.3)

## 2024-09-24 NOTE — Progress Notes (Unsigned)
 Pain Scale   Average Pain 4 Patient advising she has neck pain radiating to left shoulder pain is constant.        +Driver, -BT, -Dye Allergies.

## 2024-09-24 NOTE — Progress Notes (Unsigned)
 Latera Ivan Kennedy - 83 y.o. female MRN [id number]  Date of birth: 1941-05-18  Office Visit Note: Visit Date: 09/24/2024 PCP: [name] Arlyss RAMAN, MD Referred by: [name] Arlyss RAMAN, MD  Subjective: Chief Complaint  Patient presents with   Neck - Pain   HPI: Kelly Kennedy is a 83 y.o. female who comes in today per the request of Dr. Debby Shipper for evaluation of chronic, worsening and severe left sided neck pain radiating to shoulder and down to elbow. Patient is somewhat of a poor historian. Her daughter is accompanying her during our visit today. Pain ongoing for about 1 year. She describes pain as heaviness sensation, currently rates as 9 out of 10. Some relief of pain with home exercise regimen, rest and use of medications. No history of formal physical therapy. No prior imaging of cervical spine. She does have history of breast cancer. History of (2) left mastectomy surgeries with Dr. Shipper in 2016. No history of cervical surgery/injections. Patient denies focal weakness, numbness and tingling. No recent trauma or falls.      Review of Systems  Musculoskeletal:  Positive for neck pain.  Neurological:  Negative for tingling, sensory change, focal weakness and weakness.  All other systems reviewed and are negative.  Otherwise per HPI.  Assessment & Plan: Visit Diagnoses:    ICD-10-CM   1. Neck pain, chronic  M54.2    G89.29     2. Radiculopathy, cervical region  M54.12 XR Cervical Spine 2 or 3 views    3. Chronic left shoulder pain  M25.512    G89.29        Plan: Findings:  Chronic, worsening and severe left sided neck pain radiating to shoulder and down to elbow. Patient continues to have severe pain despite good conservative therapies such as home exercise regimen, rest and use of medications. Patients clinical presentation and exam are consistent with cervical radiculopathy. I obtained cervical radiographs in the office today that shows severe multi level  degenerative changes with disc height loss, anterior osteophytes and facet arthropathy. We discussed treatment plan in detail today. Next step is to obtain cervical MRI imaging. Depending on results of cervical MRI we discussed possibility of performing cervical injections. Her exam today is non focal, good strength noted to bilateral upper extremities.     Meds & Orders: No orders of the defined types were placed in this encounter.   Orders Placed This Encounter  Procedures   XR Cervical Spine 2 or 3 views    Follow-up: Return for Cervical MRI imaging.   Procedures: No procedures performed      Clinical History: No specialty comments available.   She reports that she quit smoking about 7 years ago. Her smoking use included cigarettes. She has quit using smokeless tobacco.  Recent Labs    12/04/23 1705 09/09/24 1629  HGBA1C 5.7* 6.4    Objective:  VS:  HT:    WT:   BMI:     BP:   HR: bpm  TEMP: ( )  RESP:  Physical Exam Vitals and nursing note reviewed.  HENT:     Head: Normocephalic and atraumatic.     Right Ear: External ear normal.     Left Ear: External ear normal.     Nose: Nose normal.     Mouth/Throat:     Mouth: Mucous membranes are moist.  Eyes:     Extraocular Movements: Extraocular movements intact.  Cardiovascular:     Rate and Rhythm: Normal  rate.     Pulses: Normal pulses.  Pulmonary:     Effort: Pulmonary effort is normal.  Abdominal:     General: Abdomen is flat. There is no distension.  Musculoskeletal:        General: Tenderness present.     Cervical back: Normal range of motion.     Comments: Discomfort noted with side to side rotation. Patient has good strength in the upper extremities including 5 out of 5 strength in wrist extension, long finger flexion and APB. Shoulder range of motion is full bilaterally without any sign of impingement. There is no atrophy of the hands intrinsically. Sensation intact bilaterally. Negative Hoffman's sign.  Negative Spurling's sign.     Skin:    General: Skin is warm and dry.     Capillary Refill: Capillary refill takes less than 2 seconds.  Neurological:     General: No focal deficit present.     Mental Status: She is alert and oriented to person, place, and time.  Psychiatric:        Mood and Affect: Mood normal.        Behavior: Behavior normal.     Ortho Exam  Imaging: No results found.  Past Medical/Family/Surgical/Social History: Medications & Allergies reviewed per EMR, new medications updated. Patient Active Problem List   Diagnosis Date Noted   Syncope 09/11/2024   CKD stage 3a, GFR 45-59 ml/min (HCC) 09/11/2024   Hypokalemia 09/11/2024   Vitamin D deficiency 09/11/2024   Breast symptom 04/04/2024   Mood change 04/04/2024   History of UTI 12/06/2023   Dark stools 07/05/2023   Vertigo 07/05/2023   Knee pain 02/09/2023   Fall 02/09/2023   Pulmonary embolism (HCC) 11/25/2022   DVT (deep venous thrombosis) (HCC) 11/25/2022   NSTEMI (non-ST elevated myocardial infarction) (HCC) 11/22/2022   Anemia 06/02/2021   Memory loss 02/24/2021   B12 deficiency 02/24/2021   Dysphagia 10/28/2020   Abnormality on screening test 03/08/2020   PVCs (premature ventricular contractions) 10/11/2018   Coronary artery disease 09/12/2018   Pulmonary nodule 08/08/2018   Fatty liver 08/05/2018   Abnormal prominence of clavicle 03/24/2017   SUI (stress urinary incontinence, female) 12/20/2016   Diarrhea 11/12/2015   Neoplasm of left breast, primary tumor staging category Tis: ductal carcinoma in situ (DCIS) 08/06/2015   Malignant neoplasm of upper-outer quadrant of left breast in female, estrogen receptor positive (HCC) 06/10/2015   History of breast cancer 05/14/2015   HLD (hyperlipidemia) 12/11/2014   History of diabetes mellitus 12/11/2014   Essential hypertension 09/28/2014   ? History of TIA (transient ischemic attack) 09/28/2014   Osteopenia 07/09/2014   Advance care planning  07/06/2014   Obesity, unspecified 07/03/2013   Anxiety state 07/03/2013   Medicare annual wellness visit, subsequent 05/27/2012   MENOPAUSAL SYNDROME 06/15/2010   Insomnia 06/15/2010   Non-insulin  dependent type 2 diabetes mellitus (HCC) 09/19/2008   GERD 09/16/2008   Hypothyroidism 08/02/2007   DEPENDENT EDEMA 08/02/2007   SYSTOLIC MURMUR 07/17/2007   ANGINA, HX OF 07/17/2007   Diverticulosis of colon 06/28/2005   Past Medical History:  Diagnosis Date   adenomatous Colon polyps    colonoscopy 8/99, 12/02, 8/06   Arthritis    Bell's palsy    Cancer (HCC)    DCIS left breast   Cataract    Chest pain    uses NTG as needed   Depression    Diabetes mellitus without complication (HCC)    Diverticulosis of colon 06/2005   Ductal carcinoma  in situ (DCIS) of left breast    Family history of adverse reaction to anesthesia     my mother didn't wake up so they had to put her on life support for about an hour or more; my sisiter has problems waking up too from anesthesia.   GERD (gastroesophageal reflux disease)    Glaucoma    H/O hiatal hernia    Headache(784.0)    Hyperglycemia    Hyperlipidemia    Hypertension    Hypothyroid    Insomnia    Irritable bowel    Menopausal symptoms    Pericarditis 1980's   Stroke [facility])    TIA (transient ischemic attack)    09/2014   Family History  Problem Relation Age of Onset   Cancer Father        stomach    Stomach cancer Father    Colon cancer Father    Heart disease Son        MI   Arthritis Mother    Diabetes Mother    Cancer Brother        lung cancer   Pancreatic cancer Neg Hx    Breast cancer Neg Hx    Esophageal cancer Neg Hx    Rectal cancer Neg Hx    Past Surgical History:  Procedure Laterality Date   APPENDECTOMY     BREAST EXCISIONAL BIOPSY Right    benign   BREAST RECONSTRUCTION WITH PLACEMENT OF TISSUE EXPANDER AND FLEX HD (ACELLULAR HYDRATED DERMIS) Left 08/06/2015   Procedure: LEFT BREAST RECONSTRUCTION WITH  PLACEMENT OF SALINE IMPLANT AND ACELLULAR DURMAL MATRIX;  Surgeon: Alm Sick, MD;  Location: [facility] OR;  Service: Plastics;  Laterality: Left;   CARDIAC CATHETERIZATION     CATARACT EXTRACTION Bilateral 2016   CHOLECYSTECTOMY  1990   COLONOSCOPY     CYSTECTOMY  05/04/2008   left lower arm, benign   ECTOPIC PREGNANCY SURGERY  1971   FRACTURE SURGERY     nose   LEFT HEART CATH AND CORONARY ANGIOGRAPHY [name] 09/21/2018   Procedure: LEFT HEART CATH AND CORONARY ANGIOGRAPHY;  Surgeon: Anner Alm ORN, MD;  Location: [facility] INVASIVE CV LAB;  Service: Cardiovascular;  Laterality: [name];   LEFT HEART CATH AND CORONARY ANGIOGRAPHY [name] 11/23/2022   Procedure: LEFT HEART CATH AND CORONARY ANGIOGRAPHY;  Surgeon: Wonda Sharper, MD;  Location: [facility] INVASIVE CV LAB;  Service: Cardiovascular;  Laterality: [name];   MASTECTOMY Left 2016   NIPPLE SPARING MASTECTOMY Left 08/06/2015   Procedure: LEFT NIPPLE SPARING MASTECTOMY;  Surgeon: Debby Shipper, MD;  Location: [facility] OR;  Service: General;  Laterality: Left;   ORIF ANKLE FRACTURE Left 10/23/2013   Procedure: OPEN REDUCTION INTERNAL FIXATION (ORIF) LEFT ANKLE FRACTURE;  Surgeon: Kay Sharper Cummins, MD;  Location: [facility] OR;  Service: Orthopedics;  Laterality: Left;   PARTIAL HYSTERECTOMY     ovaries intact   PARTIAL MASTECTOMY WITH NEEDLE LOCALIZATION AND AXILLARY SENTINEL LYMPH NODE BX Left 05/06/2015   Procedure: LEFT BREAST PARTIAL MASTECTOMY WITH NEEDLE LOCALIZATION AND SENTINEL LYMPH NODE MAPPING;  Surgeon: Debby Shipper, MD;  Location: [facility];  Service: General;  Laterality: Left;   TONSILLECTOMY     TUBAL LIGATION     Social History   Occupational History   Occupation: retired, previous scientist, product/process development  Tobacco Use   Smoking status: Former    Current packs/day: 0.00    Types: Cigarettes    Quit date: 04/03/2017    Years since quitting: 7.4   Smokeless tobacco: Former  Tobacco comments:    PT STATES SHE DOES NOT SMOKE.  Vaping Use    Vaping status: Never Used  Substance and Sexual Activity   Alcohol  use: No    Alcohol /week: 0.0 standard drinks of alcohol    Drug use: No   Sexual activity: Never

## 2024-09-25 ENCOUNTER — Telehealth: Payer: Self-pay | Admitting: Family Medicine

## 2024-09-25 DIAGNOSIS — Z87898 Personal history of other specified conditions: Secondary | ICD-10-CM | POA: Insufficient documentation

## 2024-09-25 NOTE — Telephone Encounter (Signed)
 Noted.  Looks like they are also asking for the diagnosis that leads to bilateral lower extremity.

## 2024-09-25 NOTE — Telephone Encounter (Signed)
 Dx R60.9  Dependent edema.  Noted since at least 01/05/2011.

## 2024-09-25 NOTE — Telephone Encounter (Signed)
 I saw this patient recently and her daughter mentioned that the patient may not be able to trigger the Zio patch in case she has symptoms.  Her daughter did not think the patient would be able to operate the app associated with the device.  Did they talk with you about this?  Do you have any ideas for a workaround?  Many thanks.

## 2024-09-25 NOTE — Assessment & Plan Note (Signed)
 Of unclear source.  The event happened when she was sitting down.  She did not have tonic-clonic movements.  She had recently eaten.  EEG was negative.  There is no clear evidence of seizure.  Advised not to drive, regardless of cause.  Unclear if medications contributed to her event, i.e. if she had taken extra blood pressure medication that day.  Discussed stopping nitrofurantoin  since she had been on that previously to prevent UTIs.  Discussed cutting back on amitriptyline  even if that did not contribute to the event.  I am going to check with cardiology about Zio patch options.  See notes on follow-up labs.

## 2024-09-26 ENCOUNTER — Other Ambulatory Visit: Payer: Self-pay

## 2024-09-26 ENCOUNTER — Emergency Department (HOSPITAL_COMMUNITY)
Admission: EM | Admit: 2024-09-26 | Discharge: 2024-09-26 | Disposition: A | Discharge status: Home / Self Care | Attending: Emergency Medicine | Admitting: Emergency Medicine

## 2024-09-26 ENCOUNTER — Encounter (HOSPITAL_COMMUNITY): Payer: Self-pay | Admitting: Emergency Medicine

## 2024-09-26 DIAGNOSIS — E860 Dehydration: Secondary | ICD-10-CM | POA: Diagnosis not present

## 2024-09-26 DIAGNOSIS — I959 Hypotension, unspecified: Secondary | ICD-10-CM | POA: Insufficient documentation

## 2024-09-26 DIAGNOSIS — R410 Disorientation, unspecified: Secondary | ICD-10-CM | POA: Diagnosis present

## 2024-09-26 LAB — COMPREHENSIVE METABOLIC PANEL WITH GFR
ALT: 11 U/L (ref 0–44)
AST: 18 U/L (ref 15–41)
Albumin: 2.8 g/dL — ABNORMAL LOW (ref 3.5–5.0)
Alkaline Phosphatase: 43 U/L (ref 38–126)
Anion gap: 10 (ref 5–15)
BUN: 14 mg/dL (ref 8–23)
CO2: 21 mmol/L — ABNORMAL LOW (ref 22–32)
Calcium: 7.9 mg/dL — ABNORMAL LOW (ref 8.9–10.3)
Chloride: 110 mmol/L (ref 98–111)
Creatinine, Ser: 1.21 mg/dL — ABNORMAL HIGH (ref 0.44–1.00)
GFR, Estimated: 45 mL/min — ABNORMAL LOW (ref >60)
Glucose, Bld: 118 mg/dL — ABNORMAL HIGH (ref 70–99)
Potassium: 3.7 mmol/L (ref 3.5–5.1)
Sodium: 141 mmol/L (ref 135–145)
Total Bilirubin: 0.6 mg/dL (ref 0.0–1.2)
Total Protein: 5.3 g/dL — ABNORMAL LOW (ref 6.5–8.1)

## 2024-09-26 LAB — URINALYSIS, ROUTINE W REFLEX MICROSCOPIC
Bacteria, UA: NONE SEEN
Bilirubin Urine: NEGATIVE
Glucose, UA: NEGATIVE mg/dL
Ketones, ur: NEGATIVE mg/dL
Nitrite: NEGATIVE
Protein, ur: NEGATIVE mg/dL
Specific Gravity, Urine: 1.008 (ref 1.005–1.030)
pH: 5 (ref 5.0–8.0)

## 2024-09-26 LAB — CBC
HCT: 33.3 % — ABNORMAL LOW (ref 36.0–46.0)
Hemoglobin: 10.8 g/dL — ABNORMAL LOW (ref 12.0–15.0)
MCH: 29.3 pg (ref 26.0–34.0)
MCHC: 32.4 g/dL (ref 30.0–36.0)
MCV: 90.5 fL (ref 80.0–100.0)
Platelets: 284 K/uL (ref 150–400)
RBC: 3.68 MIL/uL — ABNORMAL LOW (ref 3.87–5.11)
RDW: 14.4 % (ref 11.5–15.5)
WBC: 6.1 K/uL (ref 4.0–10.5)
nRBC: 0 % (ref 0.0–0.2)

## 2024-09-26 LAB — CBG MONITORING, ED: Glucose-Capillary: 106 mg/dL — ABNORMAL HIGH (ref 70–99)

## 2024-09-26 MED ORDER — SODIUM CHLORIDE 0.9 % IV BOLUS
500.0000 mL | Freq: Once | INTRAVENOUS | Status: AC
  Administered 2024-09-26: 500 mL via INTRAVENOUS

## 2024-09-26 NOTE — ED Triage Notes (Signed)
 Pt BIB EMS from home after pt was sitting at table and not responding to family talking to her. Family reported pt drooling during this time. Upon EMS arrival pt was sitting on couch and slow to respond. Pt has hx of baseline confusion. EMS administered 500 NS bolus en route.   EMS Vitals HR 70 1st BP 70/44  2nd BP 116/62 CBG 144 GCS 15

## 2024-09-26 NOTE — Discharge Instructions (Signed)
 You have been seen and discharged from the emergency department.  Your blood pressure was found to be low again and there was noted dehydration in your blood work.  It is extremely important that you stay hydrated and drink water throughout the day.  Tea, coffee and sodas will dehydrate you.  Try some hydrating powders/electrolyte supplements that are flavored.  You may also mix water with Gatorade.  If you continue to have symptoms you may consider moving up your dose of thyroid  medicine so you can take your afternoon blood pressure medicine earlier as well.  Otherwise you may explore decreasing the doses of your blood pressure medication..  Follow-up with your primary provider for further evaluation and further care. Take home medications as prescribed. If you have any worsening symptoms or further concerns for your health please return to an emergency department for further evaluation.

## 2024-09-26 NOTE — Telephone Encounter (Signed)
 Noted.   Spoke with Signe, PT with [facility], relaying Dr Elfredia message. He expresses his thanks for call back with info.

## 2024-09-26 NOTE — Telephone Encounter (Signed)
 The device will still automatically record any meaningful rhythm abnormality. She can also keep a written diary of symptoms (time and date and type of symptom) and submit it with the recorder.

## 2024-09-26 NOTE — ED Provider Notes (Signed)
 [place] EMERGENCY DEPARTMENT AT [facility] Provider Note   CSN: [id number] Arrival date & time: 09/26/24  1726     Patient presents with: Near Syncope   Kelly Kennedy is a 83 y.o. female.   HPI   83 year old female presents emergency department after an episode of altered mental status while sitting at the table and eating.  Patient had a similar presentation a couple weeks ago where she had a full syncope while seated.  Was admitted, had a thorough workup and discharged for outpatient follow-up.  Patient and daughter who witnessed both episodes states that this was similar but not as pronounced.  Each time when EMS arrived she was hypotensive, received fluids prior to arrival.  Admits to decreased p.o. intake from a fluid perspective.  Otherwise she has been compliant with home medications including blood pressure medications.  Denies any recent fever, chest pain, cough, abdominal pain, vomiting/diarrhea, genitourinary symptoms.  Denies any ongoing headache or neurodeficit.  Prior to Admission medications   Medication Sig Start Date End Date Taking? Authorizing Provider  amitriptyline  (ELAVIL ) 50 MG tablet Take 0.5-1 tablets (25-50 mg total) by mouth at bedtime as needed (dispense w/o red dye). for sleep 09/23/24   [name] Arlyss RAMAN, MD  amLODipine  (NORVASC ) 10 MG tablet Take 1 tablet (10 mg total) by mouth daily. 09/16/24   [name] Arlyss RAMAN, MD  carvedilol  (COREG ) 12.5 MG tablet Take 1 tablet (12.5 mg total) by mouth 2 (two) times daily. 12/04/23   [name] Arlyss RAMAN, MD  cholestyramine  (QUESTRAN ) 4 g packet Take 1 packet (4 g total) by mouth 3 (three) times daily as needed. 04/16/24   [name] Arlyss RAMAN, MD  cyanocobalamin  (,VITAMIN B-12,) 1000 MCG/ML injection Inject 1 mL (1,000 mcg total) into the muscle every 30 (thirty) days. 07/07/21   [name] Arlyss RAMAN, MD  diclofenac  Sodium (VOLTAREN ) 1 % GEL Apply 2 g topically 4 (four) times daily as needed. 02/03/24   [name] Arlyss RAMAN,  MD  Evolocumab  (REPATHA  SURECLICK) 140 MG/ML SOAJ ADMINISTER 1 ML UNDER THE SKIN EVERY 14 DAYS 01/26/24   Croitoru, Jerel, MD  isosorbide  mononitrate (IMDUR ) 30 MG 24 hr tablet Take 1 tablet (30 mg total) by mouth daily. 06/21/24   Croitoru, Mihai, MD  levothyroxine  (SYNTHROID ) 75 MCG tablet TAKE 1 TABLET BY MOUTH EVERY DAY BEFORE BREAKFAST 09/09/24   [name] Arlyss RAMAN, MD  LUMIGAN  0.01 % SOLN Place 1 drop into both eyes at bedtime. 12/05/23   [provider]  memantine  (NAMENDA ) 10 MG tablet TAKE 1 TABLET BY MOUTH TWICE A DAY 07/25/24   [name] Arlyss RAMAN, MD  nitroGLYCERIN  (NITROSTAT ) 0.4 MG SL tablet Place 1 tablet (0.4 mg total) under the tongue every 5 (five) minutes as needed for chest pain. 06/21/24   Croitoru, Mihai, MD  omeprazole  (PRILOSEC) 40 MG capsule TAKE 1 CAPSULE (40 MG TOTAL) BY MOUTH DAILY. 04/14/24   Craig Alan SAUNDERS, PA-C  solifenacin  (VESICARE ) 5 MG tablet Take 1 tablet (5 mg total) by mouth daily. 02/11/24   [name] Arlyss RAMAN, MD  Vitamin D, Ergocalciferol, (DRISDOL) 1.25 MG (50000 UNIT) CAPS capsule Take 1 capsule by mouth once weekly for 12 weeks. 09/16/24   [name] Arlyss RAMAN, MD    Allergies: Ace inhibitors, Angiotensin receptor blockers, Statins, Donepezil , Glipizide , Iron , Sulfa  antibiotics, Flonase  [fluticasone  propionate], Niacin, and Red dye #40 (allura red)    Review of Systems  Constitutional:  Positive for fatigue. Negative for fever.  Respiratory:  Negative for shortness of breath.  Cardiovascular:  Negative for chest pain.  Gastrointestinal:  Negative for abdominal pain, diarrhea and vomiting.  Skin:  Negative for rash.  Neurological:  Positive for light-headedness. Negative for headaches.    Updated Vital Signs BP (!) 143/75   Pulse 84   Temp 98.4 F (36.9 C)   Resp 11   Ht 5' 5 (1.651 m)   Wt 75.5 kg   SpO2 100%   BMI 27.69 kg/m   Physical Exam Vitals and nursing note reviewed.  Constitutional:      General: She is not in acute distress.     Appearance: Normal appearance. She is not ill-appearing.  HENT:     Head: Normocephalic.     Mouth/Throat:     Mouth: Mucous membranes are moist.  Eyes:     Pupils: Pupils are equal, round, and reactive to light.  Cardiovascular:     Rate and Rhythm: Normal rate.  Pulmonary:     Effort: Pulmonary effort is normal. No respiratory distress.  Abdominal:     Palpations: Abdomen is soft.     Tenderness: There is no abdominal tenderness.  Skin:    General: Skin is warm.  Neurological:     General: No focal deficit present.     Mental Status: She is alert and oriented to person, place, and time. Mental status is at baseline.  Psychiatric:        Mood and Affect: Mood normal.     (all labs ordered are listed, but only abnormal results are displayed) Labs Reviewed  COMPREHENSIVE METABOLIC PANEL WITH GFR - Abnormal; Notable for the following components:      Result Value   CO2 21 (*)    Glucose, Bld 118 (*)    Creatinine, Ser 1.21 (*)    Calcium  7.9 (*)    Total Protein 5.3 (*)    Albumin 2.8 (*)    GFR, Estimated 45 (*)    All other components within normal limits  CBC - Abnormal; Notable for the following components:   RBC 3.68 (*)    Hemoglobin 10.8 (*)    HCT 33.3 (*)    All other components within normal limits  URINALYSIS, ROUTINE W REFLEX MICROSCOPIC - Abnormal; Notable for the following components:   Hgb urine dipstick MODERATE (*)    Leukocytes,Ua SMALL (*)    All other components within normal limits  CBG MONITORING, ED - Abnormal; Notable for the following components:   Glucose-Capillary 106 (*)    All other components within normal limits    EKG: EKG Interpretation Date/Time:  Thursday September 26 2024 17:50:25 EDT Ventricular Rate:  74 PR Interval:  184 QRS Duration:  101 QT Interval:  418 QTC Calculation: 464 R Axis:   73  Text Interpretation: Sinus rhythm Left atrial enlargement Borderline T wave abnormalities similar to previous Confirmed by Bari Flank [phone]) on 09/26/2024 6:05:48 PM  Radiology: No results found.   Procedures   Medications Ordered in the ED  sodium chloride  0.9 % bolus 500 mL (0 mLs Intravenous Stopped 09/26/24 2040)                                    Medical Decision Making Amount and/or Complexity of Data Reviewed Labs: ordered.   83 year old female presents emergency department after episode of staring off and confusion while seated at table eating.  She had a similar presentation a couple weeks ago  except then she had full syncope at the table.  Was admitted, had a thorough cardiovascular workup which was reassuring, discharged for outpatient follow-up.  Plans for Zio patch.  Presents today with a similar symptoms.  Was hypotensive as well on EMS arrival, improved with IV fluids and route.  EKG here is normal sinus rhythm with normal intervals.  Blood work shows a baseline anemia, slight dehydration with elevated creatinine to 1.21. Chronic hypocalcemia. Urinalysis showed no infection.  Patient given additional fluid here with improvement.  Is tolerating p.o., feels well and back to baseline.  Given that this was a similar episode to a couple weeks ago when she had an in-depth cardiology workup, I consulted on-call cardiology fellow Dr. Vonda.  He reviewed the patient's recent workup, and is reassured by her current presentation, stable vitals and EKG.  We do not feel that she warrants emergent admission for overnight telemetry observation as the plan is to do that as an outpatient with Zio patch.  She is otherwise back to baseline and has no acute complaints, will plan for outpatient follow-up.  Patient at this time appears safe and stable for discharge and close outpatient follow up. Discharge plan and strict return to ED precautions discussed, patient verbalizes understanding and agreement.     Final diagnoses:  Hypotension, unspecified hypotension type  Dehydration    ED Discharge Orders      None          Bari Roxie HERO, DO 09/26/24 2317

## 2024-09-27 ENCOUNTER — Ambulatory Visit: Payer: Self-pay

## 2024-09-27 NOTE — Telephone Encounter (Signed)
 Attempted to contact pt. No answer, vm box not set up.   Pls get answer to Dr Elfredia question and relay his message.

## 2024-09-27 NOTE — Telephone Encounter (Signed)
 Spoke to daughter and she doesn't have a way of keeping an eye on her to record if something is wrong. States she wouldn't even know what she is looking. Advised me that you and her discussed finding a different option for it to be monitored. Please advise.

## 2024-09-27 NOTE — Telephone Encounter (Signed)
 Has she been taking her amlodipine  and carvedilol  and isosorbide ?  If so then she needs to get checked.  If she has not been on her medications then I would restart.  Thanks.

## 2024-09-27 NOTE — Telephone Encounter (Signed)
 Please notify pt's daughter about cardiac recording device.  The device will still automatically record any meaningful rhythm abnormality. If possible, she can also keep a written diary of symptoms (time and date and type of symptom) and submit it with the recorder.  Thanks.

## 2024-09-27 NOTE — Telephone Encounter (Signed)
 FYI Only or Action Required?: FYI only for provider: PT requesting orders for home health aide .  Patient was last seen in primary care on 09/23/2024 by [name] Arlyss RAMAN, MD.  Called Nurse Triage reporting Hypertension.  Symptoms began today.  Interventions attempted: Rest, hydration, or home remedies.  Symptoms are: stable.  Triage Disposition: See PCP Within 2 Weeks  Patient/caregiver understands and will follow disposition?: Yes Reason for Disposition  [1] Systolic BP >= 130 OR Diastolic >= 80 AND [2] taking BP medications  Answer Assessment - Initial Assessment Questions PT Kelly Kennedy with [facility] calling in to report elevated BP. This was before starting PT and exertion, no changes when standing. Denies chest pain or SOB. Left shoulder pain 8/10, working with orthopedic. Recently in ED for hypotension event. PT asking if there is anything different that needs to be done. PT is looking for verbal orders for an in person home health aide services. Please advise.   1. BLOOD PRESSURE: What is your blood pressure? Did you take at least two measurements 5 minutes apart?     180/100  2. ONSET: When did you take your blood pressure?     Today  3. HOW: How did you take your blood pressure? (e.g., automatic home BP monitor, visiting nurse)     Automatic bp cuff right and left arm  4. HISTORY: Do you have a history of high blood pressure?     Yes  5. MEDICINES: Are you taking any medicines for blood pressure? Have you missed any doses recently?     States compliant with BP medication  6. OTHER SYMPTOMS: Do you have any symptoms? (e.g., blurred vision, chest pain, difficulty breathing, headache, weakness)     Denies  Protocols used: Blood Pressure - High-A-AH  Copied from CRM #[id number]. Topic: Clinical - Red Word Triage >> Sep 27, 2024  3:08 PM Dedra B wrote: [facility] that prompted transfer to Nurse Triage: Kelly Kennedy from [place] calling. Pt BP elevated 180/100.  Pt was in ER for cardiac reasons and low BP, pain in L arm (8/10). Warm transfer to NT

## 2024-09-27 NOTE — Telephone Encounter (Signed)
 Please let her know that the monitor should record- this is assuming the patient can wear it.  It she can't get it set up at all, then please let me and cardiology know.   If the patient can wear the device, even if she can't make records, it should still give useful information.  Thanks.

## 2024-09-28 ENCOUNTER — Ambulatory Visit: Payer: Self-pay | Admitting: Family Medicine

## 2024-09-30 ENCOUNTER — Ambulatory Visit
Admission: RE | Admit: 2024-09-30 | Discharge: 2024-09-30 | Disposition: A | Source: Ambulatory Visit | Attending: Family Medicine | Admitting: Family Medicine

## 2024-09-30 ENCOUNTER — Other Ambulatory Visit

## 2024-09-30 ENCOUNTER — Encounter: Payer: Self-pay | Admitting: Radiology

## 2024-09-30 DIAGNOSIS — R911 Solitary pulmonary nodule: Secondary | ICD-10-CM

## 2024-09-30 NOTE — Telephone Encounter (Signed)
 Please make sure her daughter got the message too.  Thanks.

## 2024-09-30 NOTE — Telephone Encounter (Signed)
 Gave patient Dr. [name] advisement and patient verbalized understanding and had no questions.

## 2024-09-30 NOTE — Telephone Encounter (Signed)
 No, see result note/comments from 09/30/24.

## 2024-09-30 NOTE — Telephone Encounter (Signed)
 Please advise patient was seen in ED do I need to still call her about this?

## 2024-10-01 NOTE — Telephone Encounter (Signed)
 Called daughter she was the one that received the information. She was seen at ED last week wanted to know if you needed to see her for follow up from that visit. Advised that if we don't get a call to set up it is not needed.

## 2024-10-02 DIAGNOSIS — E039 Hypothyroidism, unspecified: Secondary | ICD-10-CM | POA: Diagnosis not present

## 2024-10-02 DIAGNOSIS — F32A Depression, unspecified: Secondary | ICD-10-CM

## 2024-10-02 DIAGNOSIS — I2510 Atherosclerotic heart disease of native coronary artery without angina pectoris: Secondary | ICD-10-CM

## 2024-10-02 DIAGNOSIS — I080 Rheumatic disorders of both mitral and aortic valves: Secondary | ICD-10-CM

## 2024-10-02 DIAGNOSIS — E1122 Type 2 diabetes mellitus with diabetic chronic kidney disease: Secondary | ICD-10-CM | POA: Diagnosis not present

## 2024-10-02 DIAGNOSIS — I129 Hypertensive chronic kidney disease with stage 1 through stage 4 chronic kidney disease, or unspecified chronic kidney disease: Secondary | ICD-10-CM | POA: Diagnosis not present

## 2024-10-02 DIAGNOSIS — D631 Anemia in chronic kidney disease: Secondary | ICD-10-CM

## 2024-10-02 DIAGNOSIS — K219 Gastro-esophageal reflux disease without esophagitis: Secondary | ICD-10-CM

## 2024-10-02 DIAGNOSIS — E559 Vitamin D deficiency, unspecified: Secondary | ICD-10-CM

## 2024-10-02 DIAGNOSIS — E785 Hyperlipidemia, unspecified: Secondary | ICD-10-CM

## 2024-10-02 DIAGNOSIS — N1831 Chronic kidney disease, stage 3a: Secondary | ICD-10-CM | POA: Diagnosis not present

## 2024-10-02 DIAGNOSIS — G510 Bell's palsy: Secondary | ICD-10-CM

## 2024-10-02 NOTE — Telephone Encounter (Signed)
 I think it makes sense to get through the cardiology appointment and then go from there.  I'll await that note from 10/07/24.

## 2024-10-02 NOTE — Telephone Encounter (Unsigned)
 Copied from CRM [id number]. Topic: Clinical - Red Word Triage >> Sep 27, 2024  3:08 PM Dedra B wrote: [facility] that prompted transfer to Nurse Triage: Kelly Kennedy from [place] calling. Pt BP elevated 180/100. Pt was in ER for cardiac reasons and low BP, pain in L arm (8/10). Warm transfer to NT >> Oct 02, 2024  2:56 PM Thersia BROCKS wrote: [phone] Kelly Kennedy , Confidential can leave message was calling back to followup regarding patient would like a callback about this

## 2024-10-02 NOTE — Telephone Encounter (Signed)
 Called patient reviewed all information and repeated back to me. Will call if any questions.  ? ?

## 2024-10-04 ENCOUNTER — Ambulatory Visit: Admitting: Family Medicine

## 2024-10-04 NOTE — Progress Notes (Deleted)
 Cardiology Clinic Note   Patient Name: Kelly Kennedy Date of Encounter: 10/04/2024  Primary Care Provider:  [name] Arlyss RAMAN, MD Primary Cardiologist:  Jerel Balding, MD  Patient Profile    Kelly Kennedy 83 year old female presents the clinic today for follow-up evaluation of her  coronary artery disease.  Past Medical History    Past Medical History:  Diagnosis Date   adenomatous Colon polyps    colonoscopy 8/99, 12/02, 8/06   Arthritis    Bell's palsy    Cancer (HCC)    DCIS left breast   Cataract    Chest pain    uses NTG as needed   Depression    Diabetes mellitus without complication (HCC)    Diverticulosis of colon 06/2005   Ductal carcinoma in situ (DCIS) of left breast    Family history of adverse reaction to anesthesia     my mother didn't wake up so they had to put her on life support for about an hour or more; my sisiter has problems waking up too from anesthesia.   GERD (gastroesophageal reflux disease)    Glaucoma    H/O hiatal hernia    Headache(784.0)    Hyperglycemia    Hyperlipidemia    Hypertension    Hypothyroid    Insomnia    Irritable bowel    Menopausal symptoms    Pericarditis 1980's   Stroke [facility])    TIA (transient ischemic attack)    09/2014   Past Surgical History:  Procedure Laterality Date   APPENDECTOMY     BREAST EXCISIONAL BIOPSY Right    benign   BREAST RECONSTRUCTION WITH PLACEMENT OF TISSUE EXPANDER AND FLEX HD (ACELLULAR HYDRATED DERMIS) Left 08/06/2015   Procedure: LEFT BREAST RECONSTRUCTION WITH PLACEMENT OF SALINE IMPLANT AND ACELLULAR DURMAL MATRIX;  Surgeon: Alm Sick, MD;  Location: [facility] OR;  Service: Plastics;  Laterality: Left;   CARDIAC CATHETERIZATION     CATARACT EXTRACTION Bilateral 2016   CHOLECYSTECTOMY  1990   COLONOSCOPY     CYSTECTOMY  05/04/2008   left lower arm, benign   ECTOPIC PREGNANCY SURGERY  1971   FRACTURE SURGERY     nose   LEFT HEART CATH AND CORONARY ANGIOGRAPHY [name]  09/21/2018   Procedure: LEFT HEART CATH AND CORONARY ANGIOGRAPHY;  Surgeon: Anner Alm ORN, MD;  Location: [facility] INVASIVE CV LAB;  Service: Cardiovascular;  Laterality: [name];   LEFT HEART CATH AND CORONARY ANGIOGRAPHY [name] 11/23/2022   Procedure: LEFT HEART CATH AND CORONARY ANGIOGRAPHY;  Surgeon: Wonda Sharper, MD;  Location: [facility] INVASIVE CV LAB;  Service: Cardiovascular;  Laterality: [name];   MASTECTOMY Left 2016   NIPPLE SPARING MASTECTOMY Left 08/06/2015   Procedure: LEFT NIPPLE SPARING MASTECTOMY;  Surgeon: Debby Shipper, MD;  Location: [facility] OR;  Service: General;  Laterality: Left;   ORIF ANKLE FRACTURE Left 10/23/2013   Procedure: OPEN REDUCTION INTERNAL FIXATION (ORIF) LEFT ANKLE FRACTURE;  Surgeon: Kay Sharper Cummins, MD;  Location: [facility] OR;  Service: Orthopedics;  Laterality: Left;   PARTIAL HYSTERECTOMY     ovaries intact   PARTIAL MASTECTOMY WITH NEEDLE LOCALIZATION AND AXILLARY SENTINEL LYMPH NODE BX Left 05/06/2015   Procedure: LEFT BREAST PARTIAL MASTECTOMY WITH NEEDLE LOCALIZATION AND SENTINEL LYMPH NODE MAPPING;  Surgeon: Debby Shipper, MD;  Location: [facility];  Service: General;  Laterality: Left;   TONSILLECTOMY     TUBAL LIGATION      Allergies  Allergies  Allergen Reactions   Ace Inhibitors Swelling  H/o lip swelling with change in losartan  pill   Angiotensin Receptor Blockers Swelling    H/o lip swelling with change in losartan  pill   Statins Anaphylaxis    myalgias   Donepezil  Other (See Comments)    Nightmares   Glipizide  Other (See Comments)    unstready when on med.     Iron  Other (See Comments)    Bloating with oral iron    Sulfa  Antibiotics Other (See Comments)    Intolerant of sulfa  but able to tolerate trimethoprim    Flonase  [Fluticasone  Propionate] Other (See Comments)    Intolerant of smell   Niacin Rash   Red Dye #40 (Allura Red) Rash    History of Present Illness    Kelly Kennedy has a PMH of essential hypertension,  coronary artery disease, PVCs, GERD, fatty liver, dysphagia, hypothyroidism, obesity, anxiety, and hyperlipidemia.  Her PMH also includes nonsustained atrial tachycardia, previous TIA, and probable previous pericarditis.  Coronary CT 10/19 showed high-grade stenosis in her mid left circumflex with FFR of 0.75.  This showed a calcium  score of 274.  Cardiac catheterization did not confirm the presence of single severe stenosis of circumflex artery.  She was noted to have 45% stenosed distal circumflex lesion.  LVEF was 65%.  She was seen by Dr. Francyne on 10/18/2021.  She reported taking occasional sublingual nitroglycerin  for unpredictable episodes of chest tightness.  The nitroglycerin  helps her symptoms resolve however, it takes about 15 minutes before the discomfort to go away.  She notices discomfort with doing housework like mopping or vacuuming.  She notes that her diastolic blood pressure at home is oftentimes in the 40s.  Her blood pressure in the office during her visit was 140/80.  She denied chest pain and dizziness.  She presented to the clinic 06/02/22 for follow-up evaluation stated she had not noticed any chest discomfort while doing increased physical activity.  She did note occasional chest discomfort after eating.   She did use occasional sublingual nitroglycerin  which helps her chest discomfort dissipate after about 15-20 minutes.   Her daughter presented with her and indicated that she ate fried food regularly.  We reviewed her previous coronary CTA and cholesterol.  Her cholesterol was fairly well controlled with an LDL cholesterol of 57 on 10/04/2021.  We reviewed the importance of high-fiber diet.  I ordered a coronary CTA, have her increase her physical activity as tolerated, and plan follow-up for 6 months.  Coronary CTA was not completed.  She presented to the emergency department on 11/22/2022 and was complaining of dyspnea and chest discomfort.  She received nitroglycerin  and 3  aspirin .  Her blood pressure was noted to be significantly elevated at 262/120.  Her initial troponin came back elevated at 199 and on recheck was 353.  She was started on IV heparin .  Her chest x-ray showed no acute abnormalities.  Her EKG showed sinus tachycardia with inferior and lateral T wave inversion.  She underwent repeat cardiac catheterization 11/23/2022 which showed patent LAD with mild nonobstructive plaque, dominant circumflex with mild nonobstructive plaque and nondominant RCA without significant stenosis.  Medical management was recommended.  She presented to the clinic 12/02/2022 for follow-up evaluation stated she felt well .  She denied chest pain.  She had not been very active since the catheterization.  We reviewed her medications and her DVT as well as PE.  She reported compliance with her apixaban .   Her blood pressure initially was 181/91 and on recheck was 136/66.  We  reviewed her angiography results and they expressed understanding.  I asked her to gradually increase her physical activity, continue her current medications and diet and we  planned follow-up in 3 to 4 months.  She presented to the clinic 03/09/23 for follow-up evaluation and stated she felt well.  She reported that she was at the eye doctor 03/08/23 and  had an injection.  She did notice some chest discomfort in the evening which was nonexertional.  She did take 1 sublingual nitroglycerin  and her pain resolved.  She denied any further episodes of pain.  She had been somewhat sedentary due to her right knee pain.  She did receive an injection in her knee and her knee was much better.  I encouraged her to increase her physical activity.  Her blood pressure was well-controlled.  She presented with her daughter.  Her daughter reported a decreased appetite.  She had lost around 8 pounds.  I encouraged her to increase her calories in her diet and we  planned 6 months follow-up.  She presents to the clinic today for follow-up  evaluation and states she continues to be somewhat physically active doing things around her house.  Her blood pressure is well-controlled today at 110/70.  We reviewed her medication regimen.  She denies chest discomfort.  I will refill her sublingual nitroglycerin .  She does note occasional dizziness with standing up.  She presents with her support stockings on today.  I have asked her to continue her heart healthy low-sodium diet, continue to maintain her physical activity, and we will plan follow-up in 6 to 9 months.  Today she denies chest pain, shortness of breath, lower extremity edema, fatigue, palpitations, melena, hematuria, hemoptysis, diaphoresis, weakness, presyncope, syncope, orthopnea, and PND.   Home Medications    Prior to Admission medications   Medication Sig Start Date End Date Taking? Authorizing Provider  amitriptyline  (ELAVIL ) 50 MG tablet Take 1-2 tablets (50-100 mg total) by mouth at bedtime as needed. for sleep 02/07/22   [name] Arlyss RAMAN, MD  amLODipine  (NORVASC ) 5 MG tablet Take 5 mg by mouth daily. 04/24/21   [provider]  aspirin  EC 81 MG tablet Take 1 tablet (81 mg total) by mouth daily. Swallow whole. 02/12/21   Croitoru, Mihai, MD  carvedilol  (COREG ) 12.5 MG tablet TAKE 1 TABLET BY MOUTH 2 TIMES DAILY. 02/14/22   Croitoru, Mihai, MD  cyanocobalamin  (,VITAMIN B-12,) 1000 MCG/ML injection Inject 1 mL (1,000 mcg total) into the muscle every 30 (thirty) days. 07/07/21   [name] Arlyss RAMAN, MD  diclofenac  Sodium (VOLTAREN ) 1 % GEL Apply 2 g topically 4 (four) times daily as needed. 10/04/21   [name] Arlyss RAMAN, MD  donepezil  (ARICEPT  ODT) 5 MG disintegrating tablet TAKE 1 TABLET BY MOUTH EVERYDAY AT BEDTIME 03/16/22   [name] Arlyss RAMAN, MD  fluocinonide  cream (LIDEX ) 0.05 % APPLY TOPICALLY DAILY AS NEEDED. USE SPARINGLY. 02/13/22   [name] Arlyss RAMAN, MD  isosorbide  mononitrate (IMDUR ) 30 MG 24 hr tablet TAKE 1 TABLET BY MOUTH EVERY DAY 02/22/22   Croitoru, Mihai, MD   levothyroxine  (SYNTHROID ) 75 MCG tablet TAKE 1 TABLET BY MOUTH DAILY BEFORE BREAKFAST. 12/24/21   [name] Arlyss RAMAN, MD  Lifitegrast  (XIIDRA ) 5 % SOLN Apply 1 drop to eye in the morning and at bedtime. Twice eyes    [provider]  LUMIGAN  0.01 % SOLN Place 1 drop into both eyes at bedtime. 06/29/19   [provider]  nitroGLYCERIN  (NITROSTAT ) 0.4 MG SL tablet Place 1  tablet (0.4 mg total) under the tongue every 5 (five) minutes as needed for chest pain. 02/12/21   Croitoru, Mihai, MD  omeprazole  (PRILOSEC) 40 MG capsule TAKE 1 CAPSULE (40 MG TOTAL) BY MOUTH DAILY. 02/14/22   Aneita Gwendlyn DASEN, MD  REPATHA  SURECLICK 140 MG/ML SOAJ ADMINISTER 1 ML UNDER THE SKIN EVERY 14 DAYS 09/14/21   Croitoru, Jerel, MD  solifenacin  (VESICARE ) 5 MG tablet Take 1 tablet (5 mg total) by mouth daily. 10/04/21   [name] Arlyss RAMAN, MD    Family History    Family History  Problem Relation Age of Onset   Cancer Father        stomach    Stomach cancer Father    Colon cancer Father    Heart disease Son        MI   Arthritis Mother    Diabetes Mother    Cancer Brother        lung cancer   Pancreatic cancer Neg Hx    Breast cancer Neg Hx    Esophageal cancer Neg Hx    Rectal cancer Neg Hx    She indicated that her mother is deceased. She indicated that her father is deceased. She indicated that her brother is deceased. She indicated that her son is deceased. She indicated that the status of her neg hx is unknown.  Social History    Social History   Socioeconomic History   Marital status: Widowed    Spouse name: Not on file   Number of children: 3   Years of education: Not on file   Highest education level: Not on file  Occupational History   Occupation: retired, previous scientist, product/process development  Tobacco Use   Smoking status: Former    Current packs/day: 0.00    Types: Cigarettes    Quit date: 04/03/2017    Years since quitting: 7.5   Smokeless tobacco: Former   Tobacco comments:    PT STATES  SHE DOES NOT SMOKE.  Vaping Use   Vaping status: Never Used  Substance and Sexual Activity   Alcohol  use: No    Alcohol /week: 0.0 standard drinks of alcohol    Drug use: No   Sexual activity: Never  Other Topics Concern   Not on file  Social History Narrative   From [place].   3 children, one died from MI in 2005-11-05   Married 11/06/59   Husband and brother died in the same week in 12-05-2012      Patient is right handed.   Patient has hs education.   Patient drinks 1 cup of soda daily, and green tea.   Social Drivers of Corporate Investment Banker Strain: Low Risk  (06/06/2024)   Overall Financial Resource Strain (CARDIA)    Difficulty of Paying Living Expenses: Not hard at all  Food Insecurity: Food Insecurity Present (09/12/2024)   Hunger Vital Sign    Worried About Running Out of Food in the Last Year: Sometimes true    Ran Out of Food in the Last Year: Sometimes true  Transportation Needs: No Transportation Needs (09/12/2024)   PRAPARE - Administrator, Civil Service (Medical): No    Lack of Transportation (Non-Medical): No  Physical Activity: Inactive (06/06/2024)   Exercise Vital Sign    Days of Exercise per Week: 1 day    Minutes of Exercise per Session: 0 min  Stress: Stress Concern Present (06/06/2024)   [employer] of Occupational Health - Occupational Stress Questionnaire  Feeling of Stress: To some extent  Social Connections: Socially Isolated (09/12/2024)   Social Connection and Isolation Panel    Frequency of Communication with Friends and Family: Twice a week    Frequency of Social Gatherings with Friends and Family: Once a week    Attends Religious Services: Never    Database Administrator or Organizations: No    Attends Banker Meetings: Never    Marital Status: Widowed  Intimate Partner Violence: Not At Risk (09/12/2024)   Humiliation, Afraid, Rape, and Kick questionnaire    Fear of Current or Ex-Partner: No    Emotionally  Abused: No    Physically Abused: No    Sexually Abused: No     Review of Systems    General:  No chills, fever, night sweats or weight changes.  Cardiovascular:  No chest pain, dyspnea on exertion, edema, orthopnea, palpitations, paroxysmal nocturnal dyspnea. Dermatological: No rash, lesions/masses Respiratory: No cough, dyspnea Urologic: No hematuria, dysuria Abdominal:   No nausea, vomiting, diarrhea, bright red blood per rectum, melena, or hematemesis Neurologic:  No visual changes, wkns, changes in mental status. All other systems reviewed and are otherwise negative except as noted above.  Physical Exam    VS:  There were no vitals taken for this visit. , BMI There is no height or weight on file to calculate BMI. GEN: Well nourished, well developed, in no acute distress. HEENT: normal. Neck: Supple, no JVD, carotid bruits, or masses. Cardiac: RRR, no murmurs, rubs, or gallops. No clubbing, cyanosis, generalized bilateral lower extremity nonpitting edema.  Radials/DP/PT 2+ and equal bilaterally.  Respiratory:  Respirations regular and unlabored, clear to auscultation bilaterally. GI: Soft, nontender, nondistended, BS + x 4. MS: no deformity or atrophy. Skin: warm and dry, no rash. Neuro:  Strength and sensation are intact. Psych: Normal affect.  Accessory Clinical Findings    Recent Labs: 09/09/2024: TSH 1.14 09/12/2024: Magnesium  1.6 09/26/2024: ALT 11; BUN 14; Creatinine, Ser 1.21; Hemoglobin 10.8; Platelets 284; Potassium 3.7; Sodium 141   Recent Lipid Panel    Component Value Date/Time   CHOL 150 09/09/2024 1629   CHOL 121 10/30/2019 0915   TRIG 107.0 09/09/2024 1629   TRIG 203 10/03/2012 0000   HDL 67.50 09/09/2024 1629   HDL 72 10/30/2019 0915   CHOLHDL 2 09/09/2024 1629   VLDL 21.4 09/09/2024 1629   LDLCALC 61 09/09/2024 1629   LDLCALC 26 10/30/2019 0915   LDLDIRECT 154.0 10/27/2017 0756    ECG personally reviewed by me today- EKG  Interpretation Date/Time:    Ventricular Rate:    PR Interval:    QRS Duration:    QT Interval:    QTC Calculation:   R Axis:      Text Interpretation:     EKG 06/02/2022 normal sinus rhythm LVH possible lateral infarct undetermined age 25 bpm- No acute changes  Cardiac catheterization 09/21/2018 Dist Cx lesion is 45% stenosed -angiographically moderate at best disease. Does not explain CT scan. There is hyperdynamic left ventricular systolic function. The left ventricular ejection fraction is greater than 65% by visual estimate. LV end diastolic pressure is normal.   Mild to moderate disease distal dominant Circumflex.  There is no culprit lesion to explain CT scan findings. Otherwise essentially normal coronary arteries and a left dominant system. Normal LVEF with hyperdynamic function.  The patient had a brief run of PAT/SVT during injection.   Patient will return to short stay holding area for ongoing care. Would recommend beta-blocker.  The patient felt palpitations with PVCs and brief runs of PAT with left ventricular hemodynamics and LV gram.  She describes her chest discomfort as the symptoms noted with documented ectopy and brief PAT runs.   She will be ready for discharge home after bedrest.   Recommend Aspirin  81mg  daily for moderate CAD.      Alm Clay, MD  Diagnostic Dominance: Left  Intervention    Left Heart Catheterization 11/23/22 Patent left main with no significant stenosis Patent LAD with mild non-obstructive plaque at the first diagonal Patent, dominant LCx with mild nonobstructive plaque in the proximal and mid-vessel Nondominant RCA without significant stenosis Normal LVEDP   Recommend: medical therapy for nonobstructive CAD. Consider non-cardiac symptoms.  Diagnostic Dominance: Left    Echocardiogram 11/23/22  1. Left ventricular ejection fraction, by estimation, is 60 to 65%. The  left ventricle has normal function. The left ventricle  has no regional  wall motion abnormalities. There is moderate asymmetric left ventricular  hypertrophy of the basal-septal  segment. Left ventricular diastolic parameters are consistent with Grade I  diastolic dysfunction (impaired relaxation).   2. Right ventricular systolic function is normal. The right ventricular  size is normal.   3. The mitral valve is grossly normal. Trivial mitral valve  regurgitation. No evidence of mitral stenosis.   4. The aortic valve is tricuspid. There is mild calcification of the  aortic valve. There is mild thickening of the aortic valve. Aortic valve  regurgitation is not visualized. Aortic valve sclerosis/calcification is  present, without any evidence of  aortic stenosis.   5. The inferior vena cava is dilated in size with <50% respiratory  variability, suggesting right atrial pressure of 15 mmHg.   Comparison(s): Compared to prior TTE report in 2016, the LVEF appears more  robust (previously reported as 50-55%). Otherwise, there is no significant  change.   Assessment & Plan   1.  Essential hypertension-BP today 110/70   Continue amlodipine , carvedilol , Imdur  Heart healthy low-sodium diet-salty 6 reviewed Maintain blood pressure log  Coronary artery disease-denies chest discomfort.  No recent nitroglycerin  use.  With cardiac catheterization 10/2722 and was noted to have mild nonobstructive plaque.  Medical management was recommended Continue amlodipine , carvedilol , Imdur , nitroglycerin , Repatha  Heart healthy low-sodium diet Maintain physical activity Refill sublingual nitroglycerin   Hyperlipidemia-09/09/2024: Cholesterol 150; HDL 67.50; LDL Cholesterol 61; Triglycerides 107.0; VLDL 21.4 Continue Repatha  Heart healthy low-sodium high-fiber diet Increase physical activity gradually Repeat fasting lipids and LFTs 4/25    Type 2 diabetes-glucose 105 on 07/03/23 Heart healthy low-sodium carb modified diet Follows with PCP  Disposition:  Follow-up with Dr. Francyne  in 6-9 months.     Josefa HERO. Payzlee Ryder NP-C     10/04/2024, 7:34 AM [facility] [address] Office [phone] Fax [phone]  Notice: This dictation was prepared with Dragon dictation along with smaller phrase technology. Any transcriptional errors that result from this process are unintentional and may not be corrected upon review.  I spent 14*** minutes examining this patient, reviewing medications, and using patient centered shared decision making involving her cardiac care.   I spent  20 minutes reviewing her past medical history,  medications, and prior cardiac tests.

## 2024-10-05 ENCOUNTER — Other Ambulatory Visit: Payer: Self-pay | Admitting: Family Medicine

## 2024-10-06 ENCOUNTER — Ambulatory Visit: Payer: Self-pay | Admitting: Family Medicine

## 2024-10-06 DIAGNOSIS — E041 Nontoxic single thyroid nodule: Secondary | ICD-10-CM

## 2024-10-07 ENCOUNTER — Ambulatory Visit: Admitting: General Practice

## 2024-10-07 NOTE — Telephone Encounter (Signed)
 Per 06/11/24 OV notes, pt was to take 10 mg BID. Last rx sent 07/25/24, #180/1 refill to [facility] [address]

## 2024-10-08 ENCOUNTER — Other Ambulatory Visit: Payer: Self-pay | Admitting: *Deleted

## 2024-10-09 ENCOUNTER — Telehealth: Payer: Self-pay

## 2024-10-09 NOTE — Patient Outreach (Addendum)
 Complex Care Management   Visit Note  10/09/2024  Name:  Kelly Kennedy MRN: [id number] DOB: [birth year]  Situation: Referral received for Complex Care Management related to Mental/Behavioral Health diagnosis depression and memory challenges I obtained verbal consent from Patient.  Visit completed with patient and daughter  on the phone on 10/08/24  Background:   Past Medical History:  Diagnosis Date   adenomatous Colon polyps    colonoscopy 8/99, 12/02, 8/06   Arthritis    Bell's palsy    Cancer (HCC)    DCIS left breast   Cataract    Chest pain    uses NTG as needed   Depression    Diabetes mellitus without complication (HCC)    Diverticulosis of colon 06/2005   Ductal carcinoma in situ (DCIS) of left breast    Family history of adverse reaction to anesthesia     my mother didn't wake up so they had to put her on life support for about an hour or more; my sisiter has problems waking up too from anesthesia.   GERD (gastroesophageal reflux disease)    Glaucoma    H/O hiatal hernia    Headache(784.0)    Hyperglycemia    Hyperlipidemia    Hypertension    Hypothyroid    Insomnia    Irritable bowel    Menopausal symptoms    Pericarditis 1980's   Stroke (HCC)    TIA (transient ischemic attack)    09/2014    Assessment: Patient Reported Symptoms:  Cognitive Cognitive Status: Struggling with memory recall      Neurological Neurological Review of Symptoms: No symptoms reported    HEENT HEENT Symptoms Reported: No symptoms reported HEENT Management Strategies: Routine screening    Cardiovascular Cardiovascular Symptoms Reported: Swelling in legs or feet Cardiovascular Comment: right knee swelling  Respiratory Respiratory Symptoms Reported: No symptoms reported    Endocrine Endocrine Symptoms Reported: No symptoms reported Is patient diabetic?: Yes Is patient checking blood sugars at home?: Yes List most recent blood sugar readings, include date and time of  day: did not check it today    Gastrointestinal Gastrointestinal Symptoms Reported: No symptoms reported      Genitourinary Genitourinary Symptoms Reported: No symptoms reported    Integumentary Integumentary Symptoms Reported: No symptoms reported    Musculoskeletal Musculoskelatal Symptoms Reviewed: Back pain Additional Musculoskeletal Details: patient active with PT and OT-walks to the mailbox and back Musculoskeletal Management Strategies: Coping strategies      Psychosocial Psychosocial Symptoms Reported: No symptoms reported Additional Psychological Details: mood has improved , denies depressed mood   Behaviors When Feeling Stressed/Fearful: perspective change,, excercise 15 minutes, accepting help from daughter, making sure she eats her meals , hydrates Techniques to Cope with Loss/Stress/Change: Diversional activities, Exercise Quality of Family Relationships: supportive, helpful, involved Do you feel physically threatened by others?: No    10/09/2024    PHQ2-9 Depression Screening   Little interest or pleasure in doing things Not at all  Feeling down, depressed, or hopeless Not at all  PHQ-2 - Total Score 0  Trouble falling or staying asleep, or sleeping too much    Feeling tired or having little energy    Poor appetite or overeating     Feeling bad about yourself - or that you are a failure or have let yourself or your family down    Trouble concentrating on things, such as reading the newspaper or watching television    Moving or speaking so slowly that other people  could have noticed.  Or the opposite - being so fidgety or restless that you have been moving around a lot more than usual    Thoughts that you would be better off dead, or hurting yourself in some way    PHQ2-9 Total Score    If you checked off any problems, how difficult have these problems made it for you to do your work, take care of things at home, or get along with other people    Depression  Interventions/Treatment      There were no vitals filed for this visit.    Medications Reviewed Today     Reviewed by Ermalinda Lenn HERO, LCSW (Social Worker) on 10/08/24 at 1643  Med List Status: <None>   Medication Order Taking? Sig Documenting Provider Last Dose Status Informant  amitriptyline  (ELAVIL ) 50 MG tablet [id number] Yes Take 0.5-1 tablets (25-50 mg total) by mouth at bedtime as needed (dispense w/o red dye). for sleep [name] Arlyss RAMAN, MD  Active   amLODipine  (NORVASC ) 10 MG tablet [id number] Yes Take 1 tablet (10 mg total) by mouth daily. [name] Arlyss RAMAN, MD  Active   carvedilol  (COREG ) 12.5 MG tablet [id number] Yes Take 1 tablet (12.5 mg total) by mouth 2 (two) times daily. [name] Arlyss RAMAN, MD  Active Self, Child, Pharmacy Records  cholestyramine  (QUESTRAN ) 4 g packet [id number] Yes Take 1 packet (4 g total) by mouth 3 (three) times daily as needed. [name] Arlyss RAMAN, MD  Active Self, Child, Pharmacy Records  cyanocobalamin  (,VITAMIN B-12,) 1000 MCG/ML injection [id number] Yes Inject 1 mL (1,000 mcg total) into the muscle every 30 (thirty) days. [name] Arlyss RAMAN, MD  Active Self, Child, Pharmacy Records  diclofenac  Sodium (VOLTAREN ) 1 % GEL [id number] Yes Apply 2 g topically 4 (four) times daily as needed. [name] Arlyss RAMAN, MD  Active Self, Child, Pharmacy Records  Evolocumab  (REPATHA  SURECLICK) 140 MG/ML EMMANUEL [id number] Yes ADMINISTER 1 ML UNDER THE SKIN EVERY 14 DAYS Croitoru, Mihai, MD  Active Self, Child, Pharmacy Records  isosorbide  mononitrate (IMDUR ) 30 MG 24 hr tablet [id number] Yes Take 1 tablet (30 mg total) by mouth daily. Croitoru, Jerel, MD  Active Self, Child, Pharmacy Records  levothyroxine  (SYNTHROID ) 75 MCG tablet [id number] Yes TAKE 1 TABLET BY MOUTH EVERY DAY BEFORE BREAKFAST [name] Arlyss RAMAN, MD  Active Self, Child, Pharmacy Records  LUMIGAN  0.01 % SOLN [id number] Yes Place 1 drop into both eyes at bedtime. [provider]  Active Self, Child, Pharmacy Records   memantine  (NAMENDA ) 10 MG tablet [id number] Yes TAKE 1 TABLET BY MOUTH TWICE A DAY [name] Arlyss RAMAN, MD  Active Self, Child, Pharmacy Records  nitroGLYCERIN  (NITROSTAT ) 0.4 MG SL tablet [id number] Yes Place 1 tablet (0.4 mg total) under the tongue every 5 (five) minutes as needed for chest pain. Croitoru, Mihai, MD  Active Self, Child, Pharmacy Records  omeprazole  (PRILOSEC) 40 MG capsule [id number] Yes TAKE 1 CAPSULE (40 MG TOTAL) BY MOUTH DAILY. Craig Alan SAUNDERS, PA-C  Active Self, Child, Pharmacy Records  solifenacin  (VESICARE ) 5 MG tablet [id number] Yes Take 1 tablet (5 mg total) by mouth daily. [name] Arlyss RAMAN, MD  Active Self, Child, Pharmacy Records  Vitamin D, Ergocalciferol, (DRISDOL) 1.25 MG (50000 UNIT) CAPS capsule [id number] Yes Take 1 capsule by mouth once weekly for 12 weeks. [name] Arlyss RAMAN, MD  Active             Recommendation:   PCP Follow-up Specialty provider follow-up as scheduled  Follow Up Plan:  Closing From:  [facility] Social Work  [employer], [employer] [place]  [employer], [employer] [facility] Licensed Clinical Social Worker  Direct Dial: [phone]

## 2024-10-09 NOTE — Telephone Encounter (Deleted)
 error

## 2024-10-09 NOTE — Patient Instructions (Signed)
 Visit Information  Thank you for taking time to visit with me today. Please don't hesitate to contact me if I can be of assistance to you before our next scheduled appointment.  Your next care management appointment is no further scheduled appointments.   Closing From: [facility] social work  Please call the care guide team at [phone] if you need to cancel, schedule, or reschedule an appointment.   Please call the Suicide and Crisis Lifeline: 988 call the [place]  National Suicide Prevention Lifeline: 9842909029 or TTY: (347)024-9191 TTY 680 447 7818) to talk to a trained counselor call 1-800-273-TALK (toll free, 24 hour hotline) call 911 if you are experiencing a Mental Health or Behavioral Health Crisis or need someone to talk to.  Aubrina Nieman, LCSW [place]  [facility], [facility] Licensed Clinical Social Worker  Direct Dial: [phone]

## 2024-10-09 NOTE — Telephone Encounter (Signed)
 Sen ding note to [facility].

## 2024-10-09 NOTE — Telephone Encounter (Signed)
 Please verify.  She should have rx for 10mg  tab BID at pharmacy.  Thanks.

## 2024-10-09 NOTE — Telephone Encounter (Signed)
 Copied from CRM [id number]. Topic: General - Other >> Oct 09, 2024  3:29 PM Pinkey ORN wrote: Reason for CRM: Home Health Report >> Oct 09, 2024  3:31 PM Pinkey ORN wrote: [facility] [id number] States the patient was seen today, doing well and is being discharged from home health services.

## 2024-10-09 NOTE — Telephone Encounter (Deleted)
 Copied from CRM [id number]. Topic: General - Other >> Oct 09, 2024  3:29 PM Pinkey ORN wrote: Reason for CRM: Home Health Report >> Oct 09, 2024  3:31 PM Pinkey ORN wrote: [facility] [id number] States the patient was seen today, doing well and is being discharged from home health services.

## 2024-10-11 ENCOUNTER — Telehealth

## 2024-10-11 NOTE — Patient Instructions (Signed)
 Kelly Kennedy - I am sorry I was unable to reach you today for our scheduled appointment. I work with [name] Arlyss RAMAN, MD and am calling to support your healthcare needs. Please contact me at [phone] at your earliest convenience. I look forward to speaking with you soon.   Thank you,  [name], MSN, RN [facility]  [facility], [facility] RN Care Manager Direct Dial: [phone] Fax: [phone]

## 2024-10-13 NOTE — Telephone Encounter (Signed)
 See below. Please check with pharmacy if needed.  Thanks.

## 2024-10-14 ENCOUNTER — Ambulatory Visit
Admission: RE | Admit: 2024-10-14 | Discharge: 2024-10-14 | Disposition: A | Discharge status: Home / Self Care | Source: Ambulatory Visit | Attending: Family Medicine | Admitting: Family Medicine

## 2024-10-14 DIAGNOSIS — E041 Nontoxic single thyroid nodule: Secondary | ICD-10-CM

## 2024-10-15 ENCOUNTER — Ambulatory Visit
Admission: RE | Admit: 2024-10-15 | Discharge: 2024-10-15 | Disposition: A | Discharge status: Home / Self Care | Source: Ambulatory Visit | Attending: Physical Medicine and Rehabilitation | Admitting: Physical Medicine and Rehabilitation

## 2024-10-15 ENCOUNTER — Other Ambulatory Visit: Payer: Self-pay | Admitting: Family Medicine

## 2024-10-16 NOTE — Telephone Encounter (Signed)
 Spoke with [facility] [address] asking about 10 mg rx. Told pt picked up last refill on current rx 10/11/24. Fyi to Dr [name].

## 2024-10-16 NOTE — Telephone Encounter (Signed)
 Pls associate dx to rx.   Vesicare  Last filled:  07/18/24 Last OV:  09/23/24, f/u Next OV:  none

## 2024-10-16 NOTE — Telephone Encounter (Signed)
 Noted.  I denied this rx for 5mg  namenda .  Thanks.

## 2024-10-18 NOTE — Patient Outreach (Signed)
 RNCM - patient rescheduled after missed appointment

## 2024-10-20 ENCOUNTER — Ambulatory Visit: Payer: Self-pay | Admitting: Family Medicine

## 2024-10-20 DIAGNOSIS — E041 Nontoxic single thyroid nodule: Secondary | ICD-10-CM

## 2024-10-21 ENCOUNTER — Ambulatory Visit: Admitting: Orthopedic Surgery

## 2024-10-21 ENCOUNTER — Other Ambulatory Visit

## 2024-10-21 DIAGNOSIS — M25561 Pain in right knee: Secondary | ICD-10-CM

## 2024-10-22 ENCOUNTER — Ambulatory Visit: Admitting: *Deleted

## 2024-10-22 ENCOUNTER — Encounter: Payer: Self-pay | Admitting: *Deleted

## 2024-10-22 ENCOUNTER — Telehealth: Payer: Self-pay | Admitting: Family Medicine

## 2024-10-22 DIAGNOSIS — E538 Deficiency of other specified B group vitamins: Secondary | ICD-10-CM | POA: Diagnosis not present

## 2024-10-22 MED ORDER — CYANOCOBALAMIN 1000 MCG/ML IJ SOLN
1000.0000 ug | Freq: Once | INTRAMUSCULAR | Status: AC
  Administered 2024-10-22: 1000 ug via INTRAMUSCULAR

## 2024-10-22 NOTE — Telephone Encounter (Signed)
 Copied from CRM [id number]. Topic: General - Other >> Oct 21, 2024  2:18 PM Franky GRADE wrote: Reason for CRM: Patient's daughter Darleen Henry is calling to advise that the imaging the patient was suppose to get for her thyroid  could not be completed due to the use of red dye in the test. I asked to see if she knew the name of the test but she doesn't.

## 2024-10-22 NOTE — Telephone Encounter (Signed)
 Already addressed (see 10/20/24 Results F/u note & 10/22/24 pt msg).

## 2024-10-22 NOTE — Progress Notes (Signed)
 Orthopedic Surgery Progress Note   Assessment: Patient is a 83 y.o. female with right knee pain consistent with OA   Plan: -Talked about non-operative and operative options for her knee pain. After our conversation, she wanted to proceed with tylenol , injection, and PT. Referral provided to her for PT. Injection done today in the office - see procedure note below -Return to office on an as needed basis  Right knee intraarticular injection note: After discussing the risks, benefits, and alternatives of right knee intraarticular injection, the patient elected to proceed. The patient was in the seated position with the knee at 90 degrees. The anterolateral soft spot over the knee was prepped with an alcohol  based prep. Ethyl chloride was used to anesthetize the skin. A 20 gauge needle was used to inject 1cc of lidocaine , 1cc of depomedrol, 1cc of bupivacaine  into the intraarticular space using standard technique. Needle was withdrawn and band aid applied. Patient tolerated the procedure well.   ___________________________________________________________________________  Subjective: Patient has had about 2 years of knee pain. It has been getting worse with time. She feels it on the medial aspect of the knee. Gets worse with activity. Improves with rest.    Physical Exam:  General: no acute distress, appears stated age Neurologic: alert, answering questions appropriately, following commands Respiratory: unlabored breathing on room air, symmetric chest rise Psychiatric: appropriate affect, normal cadence to speech  MSK:   -Right knee exam: TTP over the medial joint line, no other tenderness to palpation over the remainder of the extremity, no effusion palpated, knee stable to varus and valgus stress, negative lachman, negative posterior drawer  Imaging: XRs of the right knee from 10/21/2024 were independently reviewed and interpreted, showing medial joint space narrowing and subchondral  sclerosis. Small osteophyte in the patellofemoral compartment. No fracture or dislocation seen.   Patient name: Kelly Kennedy Patient MRN: [id number] Date: 10/21/2024

## 2024-10-22 NOTE — Progress Notes (Signed)
 Per orders of Arlyss Solian, MD injection of Vitamin B12 given by Manuelita JAYSON Frost in right deltoid. Patient tolerated injection well.

## 2024-10-25 ENCOUNTER — Other Ambulatory Visit: Payer: Self-pay | Admitting: Family Medicine

## 2024-10-30 ENCOUNTER — Ambulatory Visit: Admitting: Cardiology

## 2024-10-30 NOTE — Telephone Encounter (Signed)
 Nitrofurantoin  had previously been discontinued.  I do not have the refill.  If she is having persistent dysuria then let me know.  Thanks.

## 2024-11-01 ENCOUNTER — Telehealth: Payer: Self-pay

## 2024-11-01 NOTE — Telephone Encounter (Signed)
 Spoke with patient's daughter Darlene (on [place]) she states the patient has been taking for about two months with no allergic reaction. Pharmacy told Darlene that that is the only manufacture they will use for  levothyroxine  . Informed patient's daughter that this should not affect the situation with thyroid  nodules.

## 2024-11-01 NOTE — Telephone Encounter (Signed)
 Spoke with pt/pt's daughter, Kelly Kennedy (on dpr), relaying Dr Elfredia message and asking about any urinary sxs. Says pt no longer c/o dysuria and is not sure [facility]. Fyi to Dr [name].

## 2024-11-01 NOTE — Telephone Encounter (Signed)
 Noted.  Would continue as is.  Thanks.

## 2024-11-01 NOTE — Telephone Encounter (Signed)
 This shouldn't affect the situation re: thyroid  nodules.  Can they get med from a different manufacturer that doesn't have the dye component?  She she have a reaction to the dye?

## 2024-11-01 NOTE — Telephone Encounter (Signed)
 Noted. Thanks.

## 2024-11-01 NOTE — Telephone Encounter (Signed)
 Pt's daughter, Monta (on dpr), wanted to relay info she was given from pharmacist. States pt is allergic to red dye (noted in chart) and was advised by the pharmacist that the manufacturer supplying pt's levothyroxine  uses red dye in med. They (pt/pt's daughter) are wondering if that could be affecting pt's thyroid  and the reason she needs the bx. Pls advise.

## 2024-11-03 ENCOUNTER — Telehealth: Payer: Self-pay | Admitting: Family Medicine

## 2024-11-03 NOTE — Telephone Encounter (Signed)
 Please update patient and her daughter.  There is the issue about the thyroid  nodules-that is being addressed with scheduled biopsy.   She has an enlarging right upper lobe lung nodule.  I do not see where that has been addressed yet.  The options are to talk to pulmonary about potentially getting a biopsy or extra testing done now versus getting a repeat CT at 3 months.  Please let me know which way they want to go so I can work on the orders.  Okay to set up an office visit if they would like to talk about this in person.   Thanks.

## 2024-11-04 NOTE — Telephone Encounter (Signed)
 Spoke with Patients [name] (On [place]). She states that the patient has an appointment with the pulmonologist next week. I asked if they had any further questions, and she said none at this time.

## 2024-11-06 ENCOUNTER — Ambulatory Visit (INDEPENDENT_AMBULATORY_CARE_PROVIDER_SITE_OTHER): Admitting: *Deleted

## 2024-11-06 VITALS — Ht 65.0 in | Wt 166.0 lb

## 2024-11-06 DIAGNOSIS — Z0000 Encounter for general adult medical examination without abnormal findings: Secondary | ICD-10-CM | POA: Diagnosis not present

## 2024-11-06 NOTE — Patient Instructions (Signed)
 Kelly Kennedy,  Thank you for taking the time for your Medicare Wellness Visit. I appreciate your continued commitment to your health goals. Please review the care plan we discussed, and feel free to reach out if I can assist you further.  Please note that Annual Wellness Visits do not include a physical exam. Some assessments may be limited, especially if the visit was conducted virtually. If needed, we may recommend an in-person follow-up with your provider.  Ongoing Care Seeing your primary care provider every 3 to 6 months helps us  monitor your health and provide consistent, personalized care.   Referrals If a referral was made during today's visit and you haven't received any updates within two weeks, please contact the referred provider directly to check on the status.  Recommended Screenings:  Health Maintenance  Topic Date Due   Yearly kidney health urinalysis for diabetes  Never done   Zoster (Shingles) Vaccine (1 of 2) Never done   DTaP/Tdap/Td vaccine (3 - Td or Tdap) 01/29/2017   Complete foot exam   02/08/2023   COVID-19 Vaccine (5 - 2025-26 season) 07/29/2024   Hemoglobin A1C  03/10/2025   Eye exam for diabetics  06/10/2025   Breast Cancer Screening  06/20/2025   Yearly kidney function blood test for diabetes  09/26/2025   Medicare Annual Wellness Visit  11/06/2025   Pneumococcal Vaccine for age over 57  Completed   Flu Shot  Completed   Osteoporosis screening with Bone Density Scan  Completed   Meningitis B Vaccine  Aged Out   Colon Cancer Screening  Discontinued       11/06/2024    4:04 PM  Advanced Directives  Does Patient Have a Medical Advance Directive? No  Would patient like information on creating a medical advance directive? No - Patient declined    Vision: Annual vision screenings are recommended for early detection of glaucoma, cataracts, and diabetic retinopathy. These exams can also reveal signs of chronic conditions such as diabetes and high blood  pressure.  Dental: Annual dental screenings help detect early signs of oral cancer, gum disease, and other conditions linked to overall health, including heart disease and diabetes.  Please see the attached documents for additional preventive care recommendations.     Kelly Kennedy , Thank you for taking time to come for your Medicare Wellness Visit. I appreciate your ongoing commitment to your health goals. Please review the following plan we discussed and let me know if I can assist you in the future.   Screening recommendations/referrals: Colonoscopy:  Mammogram:  Bone Density: Recommended yearly ophthalmology/optometry visit for glaucoma screening and checkup Recommended yearly dental visit for hygiene and checkup  Vaccinations: Influenza vaccine:  Pneumococcal vaccine:  Tdap vaccine:  Shingles vaccine:       Preventive Care 65 Years and Older, Female Preventive care refers to lifestyle choices and visits with your health care provider that can promote health and wellness. What does preventive care include? A yearly physical exam. This is also called an annual well check. Dental exams once or twice a year. Routine eye exams. Ask your health care provider how often you should have your eyes checked. Personal lifestyle choices, including: Daily care of your teeth and gums. Regular physical activity. Eating a healthy diet. Avoiding tobacco and drug use. Limiting alcohol  use. Practicing safe sex. Taking low-dose aspirin  every day. Taking vitamin and mineral supplements as recommended by your health care provider. What happens during an annual well check? The services and screenings done by  your health care provider during your annual well check will depend on your age, overall health, lifestyle risk factors, and family history of disease. Counseling  Your health care provider may ask you questions about your: Alcohol  use. Tobacco use. Drug use. Emotional well-being. Home  and relationship well-being. Sexual activity. Eating habits. History of falls. Memory and ability to understand (cognition). Work and work astronomer. Reproductive health. Screening  You may have the following tests or measurements: Height, weight, and BMI. Blood pressure. Lipid and cholesterol levels. These may be checked every 5 years, or more frequently if you are over 14 years old. Skin check. Lung cancer screening. You may have this screening every year starting at age 53 if you have a 30-pack-year history of smoking and currently smoke or have quit within the past 15 years. Fecal occult blood test (FOBT) of the stool. You may have this test every year starting at age 98. Flexible sigmoidoscopy or colonoscopy. You may have a sigmoidoscopy every 5 years or a colonoscopy every 10 years starting at age 24. Hepatitis C blood test. Hepatitis B blood test. Sexually transmitted disease (STD) testing. Diabetes screening. This is done by checking your blood sugar (glucose) after you have not eaten for a while (fasting). You may have this done every 1-3 years. Bone density scan. This is done to screen for osteoporosis. You may have this done starting at age 27. Mammogram. This may be done every 1-2 years. Talk to your health care provider about how often you should have regular mammograms. Talk with your health care provider about your test results, treatment options, and if necessary, the need for more tests. Vaccines  Your health care provider may recommend certain vaccines, such as: Influenza vaccine. This is recommended every year. Tetanus, diphtheria, and acellular pertussis (Tdap, Td) vaccine. You may need a Td booster every 10 years. Zoster vaccine. You may need this after age 57. Pneumococcal 13-valent conjugate (PCV13) vaccine. One dose is recommended after age 6. Pneumococcal polysaccharide (PPSV23) vaccine. One dose is recommended after age 47. Talk to your health care provider  about which screenings and vaccines you need and how often you need them. This information is not intended to replace advice given to you by your health care provider. Make sure you discuss any questions you have with your health care provider. Document Released: 12/11/2015 Document Revised: 08/03/2016 Document Reviewed: 09/15/2015 Elsevier Interactive Patient Education  2017 [employer].  Fall Prevention in the Home Falls can cause injuries. They can happen to people of all ages. There are many things you can do to make your home safe and to help prevent falls. What can I do on the outside of my home? Regularly fix the edges of walkways and driveways and fix any cracks. Remove anything that might make you trip as you walk through a door, such as a raised step or threshold. Trim any bushes or trees on the path to your home. Use bright outdoor lighting. Clear any walking paths of anything that might make someone trip, such as rocks or tools. Regularly check to see if handrails are loose or broken. Make sure that both sides of any steps have handrails. Any raised decks and porches should have guardrails on the edges. Have any leaves, snow, or ice cleared regularly. Use sand or salt on walking paths during winter. Clean up any spills in your garage right away. This includes oil or grease spills. What can I do in the bathroom? Use night lights. Install grab  bars by the toilet and in the tub and shower. Do not use towel bars as grab bars. Use non-skid mats or decals in the tub or shower. If you need to sit down in the shower, use a plastic, non-slip stool. Keep the floor dry. Clean up any water that spills on the floor as soon as it happens. Remove soap buildup in the tub or shower regularly. Attach bath mats securely with double-sided non-slip rug tape. Do not have throw rugs and other things on the floor that can make you trip. What can I do in the bedroom? Use night lights. Make sure  that you have a light by your bed that is easy to reach. Do not use any sheets or blankets that are too big for your bed. They should not hang down onto the floor. Have a firm chair that has side arms. You can use this for support while you get dressed. Do not have throw rugs and other things on the floor that can make you trip. What can I do in the kitchen? Clean up any spills right away. Avoid walking on wet floors. Keep items that you use a lot in easy-to-reach places. If you need to reach something above you, use a strong step stool that has a grab bar. Keep electrical cords out of the way. Do not use floor polish or wax that makes floors slippery. If you must use wax, use non-skid floor wax. Do not have throw rugs and other things on the floor that can make you trip. What can I do with my stairs? Do not leave any items on the stairs. Make sure that there are handrails on both sides of the stairs and use them. Fix handrails that are broken or loose. Make sure that handrails are as long as the stairways. Check any carpeting to make sure that it is firmly attached to the stairs. Fix any carpet that is loose or worn. Avoid having throw rugs at the top or bottom of the stairs. If you do have throw rugs, attach them to the floor with carpet tape. Make sure that you have a light switch at the top of the stairs and the bottom of the stairs. If you do not have them, ask someone to add them for you. What else can I do to help prevent falls? Wear shoes that: Do not have high heels. Have rubber bottoms. Are comfortable and fit you well. Are closed at the toe. Do not wear sandals. If you use a stepladder: Make sure that it is fully opened. Do not climb a closed stepladder. Make sure that both sides of the stepladder are locked into place. Ask someone to hold it for you, if possible. Clearly mark and make sure that you can see: Any grab bars or handrails. First and last steps. Where the edge of  each step is. Use tools that help you move around (mobility aids) if they are needed. These include: Canes. Walkers. Scooters. Crutches. Turn on the lights when you go into a dark area. Replace any light bulbs as soon as they burn out. Set up your furniture so you have a clear path. Avoid moving your furniture around. If any of your floors are uneven, fix them. If there are any pets around you, be aware of where they are. Review your medicines with your doctor. Some medicines can make you feel dizzy. This can increase your chance of falling. Ask your doctor what other things that you can do  to help prevent falls. This information is not intended to replace advice given to you by your health care provider. Make sure you discuss any questions you have with your health care provider. Document Released: 09/10/2009 Document Revised: 04/21/2016 Document Reviewed: 12/19/2014 Elsevier Interactive Patient Education  2017 [employer].

## 2024-11-06 NOTE — Progress Notes (Signed)
 Chief Complaint  Patient presents with   Medicare Wellness     Subjective:   Kelly Kennedy is a 83 y.o. female who presents for a Medicare Annual Wellness Visit  No voiced or noted concerns at this time   Visit info / Clinical Intake: Medicare Wellness Visit Type:: Subsequent Annual Wellness Visit Persons participating in visit and providing information:: patient Medicare Wellness Visit Mode:: Telephone If telephone:: video declined Since this visit was completed virtually, some vitals may be partially provided or unavailable. Missing vitals are due to the limitations of the virtual format.: Unable to obtain vitals - no equipment If Telephone or Video please confirm:: I connected with patient using audio/video enable telemedicine. I verified patient identity with two identifiers, discussed telehealth limitations, and patient agreed to proceed. Patient Location:: home Provider Location:: home Interpreter Needed?: No Pre-visit prep was completed: no AWV questionnaire completed by patient prior to visit?: no Living arrangements:: with family/others Patient's Overall Health Status Rating: (!) fair Typical amount of pain: none Does pain affect daily life?: no Are you currently prescribed opioids?: no  Dietary Habits and Nutritional Risks How many meals a day?: 2 Eats fruit and vegetables daily?: yes Most meals are obtained by: having others provide food In the last 2 weeks, have you had any of the following?: none Diabetic:: (!) yes Any non-healing wounds?: no How often do you check your BS?: 0 Would you like to be referred to a Nutritionist or for Diabetic Management? : no  Functional Status Activities of Daily Living (to include ambulation/medication): Independent Ambulation: Independent with device- listed below Home Assistive Devices/Equipment: Cane Medication Administration: Needs assistance (comment) Home Management (perform basic housework or laundry):  Independent Manage your own finances?: (!) no Concerns about vision?: no *vision screening is required for WTM* Concerns about hearing?: no  Fall Screening Falls in the past year?: 1 Number of falls in past year: 1 Was there an injury with Fall?: 0 Fall Risk Category Calculator: 2 Patient Fall Risk Level: Moderate Fall Risk  Fall Risk Patient at Risk for Falls Due to: Impaired balance/gait Fall risk Follow up: Falls evaluation completed; Education provided; Falls prevention discussed  Home and Transportation Safety: All rugs have non-skid backing?: (!) no All stairs or steps have railings?: yes Grab bars in the bathtub or shower?: yes Have non-skid surface in bathtub or shower?: yes Good home lighting?: yes Regular seat belt use?: yes Hospital stays in the last year:: (!) yes How many hospital stays:: 2  Cognitive Assessment Difficulty concentrating, remembering, or making decisions? : yes Will 6CIT or Mini Cog be Completed: yes What year is it?: 4 points What month is it?: 3 points Give patient an address phrase to remember (5 components): its very sunny outside today in December About what time is it?: 3 points Count backwards from 20 to 1: 4 points Say the months of the year in reverse: 4 points Repeat the address phrase from earlier: 10 points 6 CIT Score: 28 points  Advance Directives (For Healthcare) Does Patient Have a Medical Advance Directive?: No Does patient want to make changes to medical advance directive?: No - Patient declined Type of Advance Directive: Healthcare Power of [place]; Living will Copy of Healthcare Power of Attorney in Chart?: No - copy requested Copy of Living Will in Chart?: No - copy requested Would patient like information on creating a medical advance directive?: No - Patient declined  Reviewed/Updated  Reviewed/Updated: Reviewed All (Medical, Surgical, Family, Medications, Allergies, Care Teams, Patient  Goals); Surgical History;  Family History; Medications; Allergies; Care Teams; Patient Goals; Medical History    Allergies (verified) Ace inhibitors, Angiotensin receptor blockers, Statins, Donepezil , Glipizide , Iron , Sulfa  antibiotics, Flonase  [fluticasone  propionate], Niacin, and Red dye #40 (allura red)   Current Medications (verified) Outpatient Encounter Medications as of 11/06/2024  Medication Sig   amitriptyline  (ELAVIL ) 50 MG tablet Take 0.5-1 tablets (25-50 mg total) by mouth at bedtime as needed (dispense w/o red dye). for sleep   amLODipine  (NORVASC ) 10 MG tablet Take 1 tablet (10 mg total) by mouth daily.   carvedilol  (COREG ) 12.5 MG tablet Take 1 tablet (12.5 mg total) by mouth 2 (two) times daily.   cholestyramine  (QUESTRAN ) 4 g packet Take 1 packet (4 g total) by mouth 3 (three) times daily as needed.   cyanocobalamin  (,VITAMIN B-12,) 1000 MCG/ML injection Inject 1 mL (1,000 mcg total) into the muscle every 30 (thirty) days.   diclofenac  Sodium (VOLTAREN ) 1 % GEL Apply 2 g topically 4 (four) times daily as needed.   Evolocumab  (REPATHA  SURECLICK) 140 MG/ML SOAJ ADMINISTER 1 ML UNDER THE SKIN EVERY 14 DAYS   isosorbide  mononitrate (IMDUR ) 30 MG 24 hr tablet Take 1 tablet (30 mg total) by mouth daily.   levothyroxine  (SYNTHROID ) 75 MCG tablet TAKE 1 TABLET BY MOUTH EVERY DAY BEFORE BREAKFAST   LUMIGAN  0.01 % SOLN Place 1 drop into both eyes at bedtime.   memantine  (NAMENDA ) 10 MG tablet TAKE 1 TABLET BY MOUTH TWICE A DAY   nitroGLYCERIN  (NITROSTAT ) 0.4 MG SL tablet Place 1 tablet (0.4 mg total) under the tongue every 5 (five) minutes as needed for chest pain.   omeprazole  (PRILOSEC) 40 MG capsule TAKE 1 CAPSULE (40 MG TOTAL) BY MOUTH DAILY.   solifenacin  (VESICARE ) 5 MG tablet TAKE 1 TABLET (5 MG TOTAL) BY MOUTH DAILY.   Vitamin D , Ergocalciferol , (DRISDOL ) 1.25 MG (50000 UNIT) CAPS capsule Take 1 capsule by mouth once weekly for 12 weeks.   No facility-administered encounter medications on file as of  11/06/2024.    History: Past Medical History:  Diagnosis Date   adenomatous Colon polyps    colonoscopy 8/99, 12/02, 8/06   Arthritis    Bell's palsy    Cancer (HCC)    DCIS left breast   Cataract    Chest pain    uses NTG as needed   Depression    Diabetes mellitus without complication (HCC)    Diverticulosis of colon 06/2005   Ductal carcinoma in situ (DCIS) of left breast    Family history of adverse reaction to anesthesia     my mother didn't wake up so they had to put her on life support for about an hour or more; my sisiter has problems waking up too from anesthesia.   GERD (gastroesophageal reflux disease)    Glaucoma    H/O hiatal hernia    Headache(784.0)    Hyperglycemia    Hyperlipidemia    Hypertension    Hypothyroid    Insomnia    Irritable bowel    Menopausal symptoms    Pericarditis 1980's   Stroke [facility])    TIA (transient ischemic attack)    09/2014   Past Surgical History:  Procedure Laterality Date   APPENDECTOMY     BREAST EXCISIONAL BIOPSY Right    benign   BREAST RECONSTRUCTION WITH PLACEMENT OF TISSUE EXPANDER AND FLEX HD (ACELLULAR HYDRATED DERMIS) Left 08/06/2015   Procedure: LEFT BREAST RECONSTRUCTION WITH PLACEMENT OF SALINE IMPLANT AND ACELLULAR DURMAL MATRIX;  Surgeon: Alm Sick, MD;  Location: [facility] OR;  Service: Plastics;  Laterality: Left;   CARDIAC CATHETERIZATION     CATARACT EXTRACTION Bilateral 2016   CHOLECYSTECTOMY  1990   COLONOSCOPY     CYSTECTOMY  05/04/2008   left lower arm, benign   ECTOPIC PREGNANCY SURGERY  1971   FRACTURE SURGERY     nose   LEFT HEART CATH AND CORONARY ANGIOGRAPHY [name] 09/21/2018   Procedure: LEFT HEART CATH AND CORONARY ANGIOGRAPHY;  Surgeon: Anner Alm ORN, MD;  Location: [facility] INVASIVE CV LAB;  Service: Cardiovascular;  Laterality: [name];   LEFT HEART CATH AND CORONARY ANGIOGRAPHY [name] 11/23/2022   Procedure: LEFT HEART CATH AND CORONARY ANGIOGRAPHY;  Surgeon: Wonda Sharper, MD;  Location: [facility] INVASIVE  CV LAB;  Service: Cardiovascular;  Laterality: [name];   MASTECTOMY Left 2016   NIPPLE SPARING MASTECTOMY Left 08/06/2015   Procedure: LEFT NIPPLE SPARING MASTECTOMY;  Surgeon: Debby Shipper, MD;  Location: [facility] OR;  Service: General;  Laterality: Left;   ORIF ANKLE FRACTURE Left 10/23/2013   Procedure: OPEN REDUCTION INTERNAL FIXATION (ORIF) LEFT ANKLE FRACTURE;  Surgeon: Kay Sharper Cummins, MD;  Location: [facility] OR;  Service: Orthopedics;  Laterality: Left;   PARTIAL HYSTERECTOMY     ovaries intact   PARTIAL MASTECTOMY WITH NEEDLE LOCALIZATION AND AXILLARY SENTINEL LYMPH NODE BX Left 05/06/2015   Procedure: LEFT BREAST PARTIAL MASTECTOMY WITH NEEDLE LOCALIZATION AND SENTINEL LYMPH NODE MAPPING;  Surgeon: Debby Shipper, MD;  Location: [facility];  Service: General;  Laterality: Left;   TONSILLECTOMY     TUBAL LIGATION     Family History  Problem Relation Age of Onset   Cancer Father        stomach    Stomach cancer Father    Colon cancer Father    Heart disease Son        MI   Arthritis Mother    Diabetes Mother    Cancer Brother        lung cancer   Pancreatic cancer Neg Hx    Breast cancer Neg Hx    Esophageal cancer Neg Hx    Rectal cancer Neg Hx    Social History   Occupational History   Occupation: retired, previous scientist, product/process development  Tobacco Use   Smoking status: Former    Current packs/day: 0.00    Types: Cigarettes    Quit date: 04/03/2017    Years since quitting: 7.6   Smokeless tobacco: Former   Tobacco comments:    PT STATES SHE DOES NOT SMOKE.  Vaping Use   Vaping status: Never Used  Substance and Sexual Activity   Alcohol  use: No    Alcohol /week: 0.0 standard drinks of alcohol    Drug use: No   Sexual activity: Never   Tobacco Counseling Counseling given: Not Answered Tobacco comments: PT STATES SHE DOES NOT SMOKE.  SDOH Screenings   Food Insecurity: No Food Insecurity (11/06/2024)  Recent Concern: Food Insecurity - Food Insecurity Present  (09/12/2024)  Housing: Unknown (11/06/2024)  Transportation Needs: No Transportation Needs (11/06/2024)  Utilities: Not At Risk (11/06/2024)  Alcohol  Screen: Low Risk  (06/06/2024)  Depression (PHQ2-9): Medium Risk (11/06/2024)  Financial Resource Strain: Low Risk  (06/06/2024)  Physical Activity: Inactive (11/06/2024)  Social Connections: Socially Isolated (11/06/2024)  Stress: Stress Concern Present (11/06/2024)  Tobacco Use: Medium Risk (11/06/2024)  Health Literacy: Inadequate Health Literacy (11/06/2024)   See flowsheets for full screening details  Depression Screen PHQ 2 & 9 Depression Scale- Over the  past 2 weeks, how often have you been bothered by any of the following problems? Little interest or pleasure in doing things: 1 Feeling down, depressed, or hopeless (PHQ Adolescent also includes...irritable): 1 PHQ-2 Total Score: 2 Trouble falling or staying asleep, or sleeping too much: 2 Feeling tired or having little energy: 1 Poor appetite or overeating (PHQ Adolescent also includes...weight loss): 1 Feeling bad about yourself - or that you are a failure or have let yourself or your family down: 0 Trouble concentrating on things, such as reading the newspaper or watching television (PHQ Adolescent also includes...like school work): 2 Moving or speaking so slowly that other people could have noticed. Or the opposite - being so fidgety or restless that you have been moving around a lot more than usual: 0 Thoughts that you would be better off dead, or of hurting yourself in some way: 0 PHQ-9 Total Score: 8 If you checked off any problems, how difficult have these problems made it for you to do your work, take care of things at home, or get along with other people?: Somewhat difficult  Depression Treatment Depression Interventions/Treatment : Counseling     Goals Addressed             This Visit's Progress    Patient Stated       No goals             Objective:     Today's Vitals   11/06/24 1603  Weight: 166 lb (75.3 kg)  Height: 5' 5 (1.651 m)   Body mass index is 27.62 kg/m.  Hearing/Vision screen Hearing Screening - Comments:: No trouble hearing Vision Screening - Comments:: Up to date Unsure of name Immunizations and Health Maintenance Health Maintenance  Topic Date Due   Diabetic kidney evaluation - Urine ACR  Never done   Zoster Vaccines- Shingrix (1 of 2) Never done   DTaP/Tdap/Td (3 - Td or Tdap) 01/29/2017   FOOT EXAM  02/08/2023   COVID-19 Vaccine (5 - 2025-26 season) 07/29/2024   HEMOGLOBIN A1C  03/10/2025   OPHTHALMOLOGY EXAM  06/10/2025   Mammogram  06/20/2025   Diabetic kidney evaluation - eGFR measurement  09/26/2025   Medicare Annual Wellness (AWV)  11/06/2025   Pneumococcal Vaccine: 50+ Years  Completed   Influenza Vaccine  Completed   Bone Density Scan  Completed   Meningococcal B Vaccine  Aged Out   Colonoscopy  Discontinued        Assessment/Plan:  This is a routine wellness examination for Kelly Kennedy.  Patient Care Team: [name] Arlyss RAMAN, MD as PCP - General (Family Medicine) Croitoru, Jerel, MD as PCP - Cardiology (Cardiology) Vanderbilt Ned, MD as Consulting Physician (General Surgery) Leora Lenis, MD as Consulting Physician (Plastic Surgery) Gaston Hamilton, MD as Consulting Physician (Urology) Octavia Bruckner, MD as Consulting Physician (Ophthalmology) Devra Lands, RN as [facility] Care Management  I have personally reviewed and noted the following in the patients chart:   Medical and social history Use of alcohol , tobacco or illicit drugs  Current medications and supplements including opioid prescriptions. Functional ability and status Nutritional status Physical activity Advanced directives List of other physicians Hospitalizations, surgeries, and ER visits in previous 12 months Vitals Screenings to include cognitive, depression, and falls Referrals and appointments  No orders of  the defined types were placed in this encounter.  In addition, I have reviewed and discussed with patient certain preventive protocols, quality metrics, and best practice recommendations. A written personalized care plan for preventive services  as well as general preventive health recommendations were provided to patient.   Mliss Graff, LPN   [id number]   Return in 1 year (on 11/06/2025).  After Visit Summary: (MyChart) Due to this being a telephonic visit, the after visit summary with patients personalized plan was offered to patient via MyChart   Nurse Notes:

## 2024-11-08 ENCOUNTER — Telehealth: Payer: Self-pay

## 2024-11-08 ENCOUNTER — Other Ambulatory Visit: Payer: Self-pay

## 2024-11-08 NOTE — Patient Instructions (Signed)
 Visit Information  Thank you for taking time to visit with me today. Please don't hesitate to contact me if I can be of assistance to you before our next scheduled appointment.  Your next care management appointment is by telephone on 12/06/2024 at 9:30 am  Telephone follow-up in 1 month  Please call the care guide team at [phone] if you need to cancel, schedule, or reschedule an appointment.   Please call the Suicide and Crisis Lifeline: 988 call the [place]  National Suicide Prevention Lifeline: 7736463648 or TTY: 769-582-6928 TTY (321)205-3887) to talk to a trained counselor call 1-800-273-TALK (toll free, 24 hour hotline) go to [facility] [address] [place] [phone]) call 911 if you are experiencing a Mental Health or Behavioral Health Crisis or need someone to talk to.  [name], MSN, RN [place]  [facility], [facility] RN Care Manager Direct Dial: [phone] Fax: [phone]   Urinary Tract Infection, Female A urinary tract infection (UTI) is an infection in your urinary tract. The urinary tract is made up of organs that make, store, and get rid of pee (urine) in your body. These organs include: The kidneys. The ureters. The bladder. The urethra. What are the causes? Most UTIs are caused by germs called bacteria. They may be in or near your genitals. These germs grow and cause swelling in your urinary tract. What increases the risk? You're more likely to get a UTI if: You're a female. The urethra is shorter in females than in males. You have a soft tube called a catheter that drains your pee. You can't control when you pee or poop. You have trouble peeing because of: A kidney stone. A urinary blockage. A nerve condition that affects your bladder. Not getting enough to drink. You're sexually active. You use a birth control inside your vagina, like spermicide. You're pregnant. You  have low levels of the hormone estrogen in your body. You're an older adult. You're also more likely to get a UTI if you have other health problems. These may include: Diabetes. A weak immune system. Your immune system is your body's defense system. Sickle cell disease. Injury of the spine. What are the signs or symptoms? Symptoms may include: Needing to pee right away. Peeing small amounts often. Pain or burning when you pee. Blood in your pee. Pee that smells bad or odd. Pain in your belly or lower back. You may also: Feel confused. This may be the first symptom in older adults. Vomit. Not feel hungry. Feel tired or easily annoyed. Have a fever or chills. How is this diagnosed? A UTI is diagnosed based on your medical history and an exam. You may also have other tests. These may include: Pee tests. Blood tests. Tests for sexually transmitted infections (STIs). If you've had more than one UTI, you may need to have imaging studies done to find out why you keep getting them. How is this treated? A UTI can be treated by: Taking antibiotics or other medicines. Drinking enough fluid to keep your pee pale yellow. In rare cases, a UTI can cause a very bad condition called sepsis. Sepsis may be treated in the hospital. Follow these instructions at home: Medicines Take your medicines only as told by your health care provider. If you were given antibiotics, take them as told by your provider. Do not stop taking them even if you start to feel better. General instructions Make sure you: Pee often and fully. Do not hold your  pee for a long time. Wipe from front to back after you pee or poop. Use each tissue only once when you wipe. Pee after you have sex. Do not douche or use sprays or powders in your genital area. Contact a health care provider if: Your symptoms don't get better after 1-2 days of taking antibiotics. Your symptoms go away and then come back. You have a fever or  chills. You vomit or feel like you may vomit. Get help right away if: You have very bad pain in your back or lower belly. You faint. This information is not intended to replace advice given to you by your health care provider. Make sure you discuss any questions you have with your health care provider. Document Revised: 10/25/2023 Document Reviewed: 02/17/2023 Elsevier Patient Education  2025 [employer].

## 2024-11-08 NOTE — Telephone Encounter (Signed)
 Copied from CRM #[id number]. Topic: Clinical - Medication Question >> Nov 08, 2024  9:47 AM Kelly Kennedy wrote: Reason for CRM: patient is having issues going to the bathroom and her daughter is calling asking for the doctor to put her back on the pills he has given her in the past for this but she doesn't know the name of them. Can someone please give her a call?

## 2024-11-08 NOTE — Telephone Encounter (Signed)
 Left message to return call to office. Ok to [employer] below.

## 2024-11-08 NOTE — Telephone Encounter (Signed)
 Does she have constipation or dysuria?  What symptoms does she have?  Please let me know.  Thanks.

## 2024-11-08 NOTE — Patient Outreach (Signed)
 Complex Care Management   Visit Note  11/08/2024  Name:  Donnella Morford MRN: [id number] DOB: 1941/01/12  Situation: Referral received for Complex Care Management related to HTN, Anxiety I obtained verbal consent from Patient.  Visit completed with Patient  on the phone  Background:   Past Medical History:  Diagnosis Date   adenomatous Colon polyps    colonoscopy 8/99, 12/02, 8/06   Arthritis    Bell's palsy    Cancer (HCC)    DCIS left breast   Cataract    Chest pain    uses NTG as needed   Depression    Diabetes mellitus without complication (HCC)    Diverticulosis of colon 06/2005   Ductal carcinoma in situ (DCIS) of left breast    Family history of adverse reaction to anesthesia     my mother didn't wake up so they had to put her on life support for about an hour or more; my sisiter has problems waking up too from anesthesia.   GERD (gastroesophageal reflux disease)    Glaucoma    H/O hiatal hernia    Headache(784.0)    Hyperglycemia    Hyperlipidemia    Hypertension    Hypothyroid    Insomnia    Irritable bowel    Menopausal symptoms    Pericarditis 1980's   Stroke (HCC)    TIA (transient ischemic attack)    09/2014    Assessment: Patient Reported Symptoms:  Cognitive Cognitive Status: Struggling with memory recall (occasional issues but I can work with it Daughter does medicines) Cognitive/Intellectual Conditions Management [RPT]: None reported or documented in medical history or problem list   Health Maintenance Behaviors: Annual physical exam  Neurological Neurological Review of Symptoms: Weakness Neurological Management Strategies: Medication therapy, Routine screening Neurological Comment: right leg weakness at times - feels wobbly uses cane - fall precautions discussed  HEENT HEENT Symptoms Reported: No symptoms reported HEENT Management Strategies: Routine screening    Cardiovascular Cardiovascular Symptoms Reported: No symptoms  reported Does patient have uncontrolled Hypertension?: No Cardiovascular Management Strategies: Routine screening, Medication therapy Weight: 166 lb (75.3 kg) Cardiovascular Comment: reports daughter checks her BP - does not recall readings - but good  Respiratory Respiratory Symptoms Reported: No symptoms reported Respiratory Management Strategies: Routine screening  Endocrine Endocrine Symptoms Reported: No symptoms reported Is patient checking blood sugars at home?: No List most recent blood sugar readings, include date and time of day: A1C 5.7 Endocrine Comment: Pending thyroid  bx due to suspicious nodules on12/23 - no discomfort, no issues eating/swallowing  Gastrointestinal Gastrointestinal Symptoms Reported: Incontinence Additional Gastrointestinal Details: occasional incontinence, no longer diarrhea Gastrointestinal Management Strategies: Medication therapy    Genitourinary Genitourinary Symptoms Reported: Frequency, Urgency, Pain/burning with urination Additional Genitourinary Details: UTI sx for several days, burning with urination, frequency, urgency, urine darker, denies pelvic or flank pain and no sx fever, conference call with daughter - she will call PCP for appoinmtent, aware  red flag sx for ED Genitourinary Management Strategies: Incontinence garment/pad  Integumentary Integumentary Symptoms Reported: No symptoms reported Skin Management Strategies: Routine screening  Musculoskeletal Musculoskelatal Symptoms Reviewed: Back pain Additional Musculoskeletal Details: uses cane most of the time, unsteady occasionally due to right knee pain/swelling -  voltaren , injections, left breast pain to neck continues - daughter and providers aware   Falls in the past year?: Yes Number of falls in past year: 2 or more Was there an injury with Fall?: No Fall Risk Category Calculator: 2 Patient Fall Risk Level: Moderate  Fall Risk Patient at Risk for Falls Due to: Impaired  balance/gait Fall risk Follow up: Falls evaluation completed, Falls prevention discussed  Psychosocial Psychosocial Symptoms Reported: No symptoms reported Additional Psychological Details: misses cooking for holidays but states mood good Behavioral Management Strategies: Medication therapy        11/08/2024    PHQ2-9 Depression Screening   Little interest or pleasure in doing things Several days  Feeling down, depressed, or hopeless Several days  PHQ-2 - Total Score 2  Trouble falling or staying asleep, or sleeping too much    Feeling tired or having little energy    Poor appetite or overeating     Feeling bad about yourself - or that you are a failure or have let yourself or your family down    Trouble concentrating on things, such as reading the newspaper or watching television    Moving or speaking so slowly that other people could have noticed.  Or the opposite - being so fidgety or restless that you have been moving around a lot more than usual    Thoughts that you would be better off dead, or hurting yourself in some way    PHQ2-9 Total Score    If you checked off any problems, how difficult have these problems made it for you to do your work, take care of things at home, or get along with other people    Depression Interventions/Treatment      Today's Vitals   11/08/24 0951  Weight: 166 lb (75.3 kg)      Medications Reviewed Today     Reviewed by Devra Lands, RN (Registered Nurse) on 11/08/24 at [phone]  Med List Status: <None>   Medication Order Taking? Sig Documenting Provider Last Dose Status Informant  amitriptyline  (ELAVIL ) 50 MG tablet [id number] Yes Take 0.5-1 tablets (25-50 mg total) by mouth at bedtime as needed (dispense w/o red dye). for sleep [name] Arlyss RAMAN, MD  Active   amLODipine  (NORVASC ) 10 MG tablet [id number] Yes Take 1 tablet (10 mg total) by mouth daily. [name] Arlyss RAMAN, MD  Active   carvedilol  (COREG ) 12.5 MG tablet [id number] Yes Take 1 tablet  (12.5 mg total) by mouth 2 (two) times daily. [name] Arlyss RAMAN, MD  Active Self, Child, Pharmacy Records  cholestyramine  (QUESTRAN ) 4 g packet [id number] Yes Take 1 packet (4 g total) by mouth 3 (three) times daily as needed. [name] Arlyss RAMAN, MD  Active Self, Child, Pharmacy Records  cyanocobalamin  (,VITAMIN B-12,) 1000 MCG/ML injection [id number] Yes Inject 1 mL (1,000 mcg total) into the muscle every 30 (thirty) days. [name] Arlyss RAMAN, MD  Active Self, Child, Pharmacy Records  diclofenac  Sodium (VOLTAREN ) 1 % GEL [id number] Yes Apply 2 g topically 4 (four) times daily as needed. [name] Arlyss RAMAN, MD  Active Self, Child, Pharmacy Records  Evolocumab  (REPATHA  SURECLICK) 140 MG/ML EMMANUEL [id number] Yes ADMINISTER 1 ML UNDER THE SKIN EVERY 14 DAYS Croitoru, Mihai, MD  Active Self, Child, Pharmacy Records  isosorbide  mononitrate (IMDUR ) 30 MG 24 hr tablet [id number] Yes Take 1 tablet (30 mg total) by mouth daily. Croitoru, Jerel, MD  Active Self, Child, Pharmacy Records  levothyroxine  (SYNTHROID ) 75 MCG tablet [id number] Yes TAKE 1 TABLET BY MOUTH EVERY DAY BEFORE BREAKFAST [name] Arlyss RAMAN, MD  Active Self, Child, Pharmacy Records  LUMIGAN  0.01 % SOLN [id number] Yes Place 1 drop into both eyes at bedtime. [provider]  Active Self, Child, Pharmacy Records  memantine  (NAMENDA ) 10 MG tablet [id number] Yes TAKE  1 TABLET BY MOUTH TWICE A DAY [name] Arlyss RAMAN, MD  Active Self, Child, Pharmacy Records  nitroGLYCERIN  (NITROSTAT ) 0.4 MG SL tablet [id number] Yes Place 1 tablet (0.4 mg total) under the tongue every 5 (five) minutes as needed for chest pain. Croitoru, Mihai, MD  Active Self, Child, Pharmacy Records  omeprazole  (PRILOSEC) 40 MG capsule [id number] Yes TAKE 1 CAPSULE (40 MG TOTAL) BY MOUTH DAILY. Craig Alan SAUNDERS, PA-C  Active Self, Child, Pharmacy Records  solifenacin  (VESICARE ) 5 MG tablet [id number] Yes TAKE 1 TABLET (5 MG TOTAL) BY MOUTH DAILY. [name] Arlyss RAMAN, MD  Active   Vitamin D ,  Ergocalciferol , (DRISDOL ) 1.25 MG (50000 UNIT) CAPS capsule [id number] Yes Take 1 capsule by mouth once weekly for 12 Tyreece Gelles. [name] Arlyss RAMAN, MD  Active             Recommendation:   PCP Follow-up Acute PCP follow-up regarding urinary symptoms. Continue Current Plan of Care  Follow Up Plan:   Telephone follow-up in 1 month  [name], MSN, RN [facility]  [facility], [facility] RN Care Manager Direct Dial: [phone] Fax: [phone]

## 2024-11-11 NOTE — Therapy (Signed)
 OUTPATIENT PHYSICAL THERAPY EVALUATION   Patient Name: Kelly Kennedy MRN: [id number] DOB:11/06/1941, 83 y.o., female Today's Date: 11/12/2024  END OF SESSION:  PT End of Session - 11/12/24 1543     Visit Number 1    Number of Visits 20    Date for Recertification  01/21/25    Authorization Type UHC Dual, no auth needed , 20% coinsurance    Progress Note Due on Visit 10    PT Start Time 1550    PT Stop Time 1630    PT Time Calculation (min) 40 min    Activity Tolerance Patient tolerated treatment well    Behavior During Therapy [facility] for tasks assessed/performed          Past Medical History:  Diagnosis Date   adenomatous Colon polyps    colonoscopy 8/99, 12/02, 8/06   Arthritis    Bell's palsy    Cancer (HCC)    DCIS left breast   Cataract    Chest pain    uses NTG as needed   Depression    Diabetes mellitus without complication (HCC)    Diverticulosis of colon 06/2005   Ductal carcinoma in situ (DCIS) of left breast    Family history of adverse reaction to anesthesia     my mother didn't wake up so they had to put her on life support for about an hour or more; my sisiter has problems waking up too from anesthesia.   GERD (gastroesophageal reflux disease)    Glaucoma    H/O hiatal hernia    Headache(784.0)    Hyperglycemia    Hyperlipidemia    Hypertension    Hypothyroid    Insomnia    Irritable bowel    Menopausal symptoms    Pericarditis 1980's   Stroke [facility])    TIA (transient ischemic attack)    09/2014   Past Surgical History:  Procedure Laterality Date   APPENDECTOMY     BREAST EXCISIONAL BIOPSY Right    benign   BREAST RECONSTRUCTION WITH PLACEMENT OF TISSUE EXPANDER AND FLEX HD (ACELLULAR HYDRATED DERMIS) Left 08/06/2015   Procedure: LEFT BREAST RECONSTRUCTION WITH PLACEMENT OF SALINE IMPLANT AND ACELLULAR DURMAL MATRIX;  Surgeon: Alm Sick, MD;  Location: [facility] OR;  Service: Plastics;  Laterality: Left;   CARDIAC CATHETERIZATION      CATARACT EXTRACTION Bilateral 2016   CHOLECYSTECTOMY  1990   COLONOSCOPY     CYSTECTOMY  05/04/2008   left lower arm, benign   ECTOPIC PREGNANCY SURGERY  1971   FRACTURE SURGERY     nose   LEFT HEART CATH AND CORONARY ANGIOGRAPHY [name] 09/21/2018   Procedure: LEFT HEART CATH AND CORONARY ANGIOGRAPHY;  Surgeon: Anner Alm ORN, MD;  Location: [facility] INVASIVE CV LAB;  Service: Cardiovascular;  Laterality: [name];   LEFT HEART CATH AND CORONARY ANGIOGRAPHY [name] 11/23/2022   Procedure: LEFT HEART CATH AND CORONARY ANGIOGRAPHY;  Surgeon: Wonda Sharper, MD;  Location: [facility] INVASIVE CV LAB;  Service: Cardiovascular;  Laterality: [name];   MASTECTOMY Left 2016   NIPPLE SPARING MASTECTOMY Left 08/06/2015   Procedure: LEFT NIPPLE SPARING MASTECTOMY;  Surgeon: Debby Shipper, MD;  Location: [facility] OR;  Service: General;  Laterality: Left;   ORIF ANKLE FRACTURE Left 10/23/2013   Procedure: OPEN REDUCTION INTERNAL FIXATION (ORIF) LEFT ANKLE FRACTURE;  Surgeon: Kay Sharper Cummins, MD;  Location: [facility] OR;  Service: Orthopedics;  Laterality: Left;   PARTIAL HYSTERECTOMY     ovaries intact   PARTIAL MASTECTOMY WITH NEEDLE LOCALIZATION AND  AXILLARY SENTINEL LYMPH NODE BX Left 05/06/2015   Procedure: LEFT BREAST PARTIAL MASTECTOMY WITH NEEDLE LOCALIZATION AND SENTINEL LYMPH NODE MAPPING;  Surgeon: Debby Shipper, MD;  Location: [facility];  Service: General;  Laterality: Left;   TONSILLECTOMY     TUBAL LIGATION     Patient Active Problem List   Diagnosis Date Noted   History of loss of consciousness 09/25/2024   Syncope 09/11/2024   CKD stage 3a, GFR 45-59 ml/min (HCC) 09/11/2024   Hypokalemia 09/11/2024   Vitamin D  deficiency 09/11/2024   Breast symptom 04/04/2024   Mood change 04/04/2024   History of UTI 12/06/2023   Dark stools 07/05/2023   Vertigo 07/05/2023   Knee pain 02/09/2023   Fall 02/09/2023   Pulmonary embolism (HCC) 11/25/2022   DVT (deep venous thrombosis) (HCC) 11/25/2022   NSTEMI  (non-ST elevated myocardial infarction) (HCC) 11/22/2022   Anemia 06/02/2021   Memory loss 02/24/2021   B12 deficiency 02/24/2021   Dysphagia 10/28/2020   Abnormality on screening test 03/08/2020   PVCs (premature ventricular contractions) 10/11/2018   Coronary artery disease 09/12/2018   Pulmonary nodule 08/08/2018   Fatty liver 08/05/2018   Abnormal prominence of clavicle 03/24/2017   SUI (stress urinary incontinence, female) 12/20/2016   Diarrhea 11/12/2015   Neoplasm of left breast, primary tumor staging category Tis: ductal carcinoma in situ (DCIS) 08/06/2015   Malignant neoplasm of upper-outer quadrant of left breast in female, estrogen receptor positive (HCC) 06/10/2015   History of breast cancer 05/14/2015   HLD (hyperlipidemia) 12/11/2014   History of diabetes mellitus 12/11/2014   Essential hypertension 09/28/2014   ? History of TIA (transient ischemic attack) 09/28/2014   Osteopenia 07/09/2014   Advance care planning 07/06/2014   Obesity, unspecified 07/03/2013   Anxiety state 07/03/2013   Medicare annual wellness visit, subsequent 05/27/2012   MENOPAUSAL SYNDROME 06/15/2010   Insomnia 06/15/2010   Non-insulin  dependent type 2 diabetes mellitus (HCC) 09/19/2008   GERD 09/16/2008   Hypothyroidism 08/02/2007   DEPENDENT EDEMA 08/02/2007   SYSTOLIC MURMUR 07/17/2007   ANGINA, HX OF 07/17/2007   Diverticulosis of colon 06/28/2005    PCP: [name] Arlyss EDISON MD  REFERRING PROVIDER: Georgina Ozell LABOR, MD  REFERRING DIAG: [phone] (ICD-10-CM) - Right knee pain, unspecified chronicity  THERAPY DIAG:  Chronic pain of right knee  Muscle weakness (generalized)  Difficulty in walking, not elsewhere classified  Rationale for Evaluation and Treatment: Rehabilitation  ONSET DATE: 2024   SUBJECTIVE:   SUBJECTIVE STATEMENT: Pt indicated onset of symptoms chronic about a year or more.  Pt indicated having trouble insidious onset.  Reported trouble with housework.  Pt  indicated trouble with prolonged standing , walking.  Pt indicated having a cane but not using it much.   Pt indicated pain continued after resting.  Swelling reported in knee.  Reported bilateral swelling in ankles.   Pt indicated sleeping ok but waking with symptoms.   PERTINENT HISTORY: Depression, history of Lt breast cancer, DM, depression, GERD, Hyperglycemia, hyperlipidemia, HTN, history of TIA, Stroke.   Injection on 10/21/2024.     PAIN:  NPRS scale: at worst 10/10, 0/10 at best Pain location: Rt knee pain Pain description: achy, sharp at times Aggravating factors: prolonged WB activity, walking, steps, squatting.  Relieving factors: injection,   PRECAUTIONS: None  WEIGHT BEARING RESTRICTIONS: No  FALLS:  Has patient fallen in last 6 months? No  LIVING ENVIRONMENT: Lives with: Lives with sister  Lives in: House/apartment Stairs: no stairs inside.  Stairs  to enter side door 3 steps, Rt rail going up.    2 steps to enter front door without rails.  Has following equipment at home: [facility], no walker   OCCUPATION: Retired  PLOF: Independent, housework.    PATIENT GOALS: Reduce pain, walking.   OBJECTIVE:   DIAGNOSTIC FINDINGS:  Knee Rt xrays 10/21/2024: XRs of the right knee from 10/21/2024 were independently reviewed and  interpreted, showing medial joint space narrowing and subchondral  sclerosis. Small osteophyte in the patellofemoral compartment. No fracture  or dislocation seen.   PATIENT SURVEYS:  Patient-Specific Activity Scoring Scheme  0 represents unable to perform. 10 represents able to perform at prior level. 0 1 2 3 4 5 6 7 8 9  10 (Date and Score)   Activity Eval  11/12/2024    1. Walking   5    2. Tub transfers   7    3. Kneeling (without help)  0   4. Stairs  4   5.    Score 4 avg    Total score = sum of the activity scores/number of activities Minimum detectable change (90%CI) for average score = 2 points Minimum detectable  change (90%CI) for single activity score = 3 points  COGNITION: 11/12/2024 Overall cognitive status: WFL    SENSATION: 11/12/2024 WFL  EDEMA:  11/12/2024 General bilateral LE edema noted in distal LE.   MUSCLE LENGTH: 11/12/2024 None tested  POSTURE:  11/12/2024 Unremarkable for condition  PALPATION: 11/12/2024 Mild tenderness posterior knee in extension stretch.   LOWER EXTREMITY ROM:   ROM Right Eval 11/12/2024 Left Eval 11/12/2024  Hip flexion    Hip extension    Hip abduction    Hip adduction    Hip internal rotation    Hip external rotation    Knee flexion 116 AROM in supine heel slide 122 AROM in supine heel slide  Knee extension -4 heel prop PROM   Ankle dorsiflexion    Ankle plantarflexion    Ankle inversion    Ankle eversion     (Blank rows = not tested)  LOWER EXTREMITY MMT:  MMT Right Eval 11/12/2024 Left Eval 11/12/2024  Hip flexion 5/5 5/5  Hip extension    Hip abduction    Hip adduction    Hip internal rotation    Hip external rotation    Knee flexion 5/5 5/5  Knee extension 4/5 5/5  Ankle dorsiflexion 5/5 5/5  Ankle plantarflexion    Ankle inversion    Ankle eversion     (Blank rows = not tested)  LOWER EXTREMITY SPECIAL TESTS:  11/12/2024 None tested  FUNCTIONAL TESTS:  11/12/2024 18 inch chair transfer: able to perform s UE assist on 1st try Lt SLS: 4 seconds Rt SLS: 5 seconds  GAIT: 11/12/2024 Independent ambulation, mild variance in stance on Rt leg at times in ambulation.  TODAY'S TREATMENT                                                                          DATE: 11/12/2024 Therex:    HEP instruction/performance c cues for techniques, handout provided.  Trial set performed of each for comprehension and symptom assessment.  See below for  exercise list  PATIENT EDUCATION:  11/12/2024 Education details: HEP, POC Person educated: Patient Education method: Explanation, Demonstration, Verbal cues, and Handouts Education comprehension: verbalized understanding, returned demonstration, and verbal cues required  HOME EXERCISE PROGRAM: Access Code: [email or web address] URL: [email or web address] Date: 11/12/2024 Prepared by: Ozell Silvan  Exercises - Seated Long Arc Quad  - 2-3 x daily - 7 x weekly - 1-2 sets - 10 reps - 2 hold - Supine Heel Slide  - 2-3 x daily - 7 x weekly - 1-2 sets - 10 reps - 2 hold - Long Sitting Quad Set with Towel Roll Under Heel  - 1-2 x daily - 7 x weekly - 1 sets - 10 reps - 5 hold - Seated Quad Set  - 1-2 x daily - 7 x weekly - 1 sets - 10 reps - 5 hold - Seated Straight Leg Raise   - 1-2 x daily - 7 x weekly - 2-3 sets - 8-15 reps - Sit to Stand  - 3 x daily - 7 x weekly - 1 sets - 10 reps  ASSESSMENT:  CLINICAL IMPRESSION: Patient is a 83 y.o. who comes to clinic with complaints of Rt knee pain with mobility, strength and movement coordination deficits that impair their ability to perform usual daily and recreational functional activities without increase difficulty/symptoms at this time.  Patient to benefit from skilled PT services to address impairments and limitations to improve to previous level of function without restriction secondary to condition.   OBJECTIVE IMPAIRMENTS: Abnormal gait, decreased activity tolerance, decreased balance, decreased coordination, decreased endurance, decreased mobility, difficulty walking, decreased ROM, decreased strength, increased edema, increased fascial restrictions, impaired perceived functional ability, impaired flexibility, improper body mechanics, and pain.   ACTIVITY LIMITATIONS: carrying, lifting, bending, standing, squatting, stairs, transfers, bathing, and locomotion level  PARTICIPATION LIMITATIONS: meal prep, cleaning, laundry, interpersonal  relationship, shopping, and community activity  PERSONAL FACTORS: Time since onset of injury/illness/exacerbation and Depression, history of Lt breast cancer, DM, depression, GERD, Hyperglycemia, hyperlipidemia, HTN, history of TIA, Stroke.  are also affecting patient's functional outcome.   REHAB POTENTIAL: Good  CLINICAL DECISION MAKING: Stable/uncomplicated  EVALUATION COMPLEXITY: Low   GOALS: Goals reviewed with patient? Yes  SHORT TERM GOALS: (target date for Short term goals are 3 weeks 12/03/2024)   1.  Patient will demonstrate independent use of home exercise program to maintain progress from in clinic treatments.  Goal status: New  LONG TERM GOALS: (target dates for all long term goals are 10 weeks  01/21/2025 )   1. Patient will demonstrate/report pain at worst less than or equal to 2/10 to facilitate minimal limitation in daily activity secondary to pain symptoms.  Goal status: New   2. Patient will demonstrate independent use of home exercise program to facilitate ability to maintain/progress functional gains from skilled physical therapy services.  Goal status: New   3. Patient will demonstrate  Patient specific functional scale avg > or = 8/10 to indicate reduced disability due to condition.   Goal status: New   4.  Patient will demonstrate Rt  LE MMT 5/5 , dynamometry with 10% of Lt for knee extension to faciltiate usual transfers, stairs, squatting at PLOF for daily life.    Goal status: New   5.  Patient will demonstrate bilateral SLS > 10 seconds for improved stability in ambulation.  Goal status: New   6.  Patient will demonstrate ascending/descending stairs reciprocally s UE assist for community integration.   Goal status: New    PLAN:  PT FREQUENCY: 1-2x/week  PT DURATION: 10 weeks  PLANNED INTERVENTIONS: Can include 02853- PT Re-evaluation, 97110-Therapeutic exercises, 97530- Therapeutic activity, [id number]- Neuromuscular re-education, 97535- Self  Care, 97140- Manual therapy, [phone]- Gait training, [phone]- Orthotic Fit/training, [phone]- Canalith repositioning, [id number]- Aquatic Therapy, [phone]- Electrical stimulation (unattended), [id number] Physical performance testing, 97016- Vasopneumatic device, [id number]- Ultrasound, [id number]- Traction (mechanical), [id number]- Ionotophoresis 4mg /ml Dexamethasone ,  79439 - Needle insertion w/o injection 1 or 2 muscles, 20561 - Needle insertion w/o injection 3 or more muscles.   Patient/Family education, Balance training, Stair training, Taping, Dry Needling, Joint mobilization, Joint manipulation, Spinal manipulation, Spinal mobilization, Scar mobilization, Vestibular training, Visual/preceptual remediation/compensation, DME instructions, Cryotherapy, and Moist heat.  All performed as medically necessary.  All included unless contraindicated  PLAN FOR NEXT SESSION: Review HEP knowledge/results.  Quad strengthening.    Ozell Silvan, PT, DPT, OCS, ATC 11/12/2024  4:33 PM

## 2024-11-11 NOTE — Telephone Encounter (Signed)
Noted, will see at [facility].  Thanks.

## 2024-11-11 NOTE — Telephone Encounter (Signed)
 Called and spoke to daughter on dpr. She states she is having some dysuria. Offered app today but she can't get in until Thursday. States she is only having little burning with urination no other symptoms. Reviewed red words if any start or any changes before app she will have patient seen at urgent care ED or call our office to be seen.

## 2024-11-12 ENCOUNTER — Ambulatory Visit: Payer: Self-pay | Admitting: Cardiovascular Disease

## 2024-11-12 ENCOUNTER — Encounter: Payer: Self-pay | Admitting: Rehabilitative and Restorative Service Providers"

## 2024-11-12 ENCOUNTER — Ambulatory Visit: Admitting: Rehabilitative and Restorative Service Providers"

## 2024-11-12 DIAGNOSIS — M6281 Muscle weakness (generalized): Secondary | ICD-10-CM

## 2024-11-12 DIAGNOSIS — G8929 Other chronic pain: Secondary | ICD-10-CM

## 2024-11-12 DIAGNOSIS — R55 Syncope and collapse: Secondary | ICD-10-CM

## 2024-11-12 DIAGNOSIS — R262 Difficulty in walking, not elsewhere classified: Secondary | ICD-10-CM

## 2024-11-14 ENCOUNTER — Ambulatory Visit

## 2024-11-14 ENCOUNTER — Encounter: Payer: Self-pay | Admitting: Family Medicine

## 2024-11-14 ENCOUNTER — Ambulatory Visit (INDEPENDENT_AMBULATORY_CARE_PROVIDER_SITE_OTHER): Admitting: Family Medicine

## 2024-11-14 VITALS — BP 142/72 | HR 100 | Temp 98.8°F | Ht 65.0 in | Wt 168.0 lb

## 2024-11-14 DIAGNOSIS — E538 Deficiency of other specified B group vitamins: Secondary | ICD-10-CM | POA: Diagnosis not present

## 2024-11-14 DIAGNOSIS — G4700 Insomnia, unspecified: Secondary | ICD-10-CM | POA: Diagnosis not present

## 2024-11-14 DIAGNOSIS — R300 Dysuria: Secondary | ICD-10-CM | POA: Diagnosis not present

## 2024-11-14 DIAGNOSIS — R911 Solitary pulmonary nodule: Secondary | ICD-10-CM | POA: Diagnosis not present

## 2024-11-14 DIAGNOSIS — M542 Cervicalgia: Secondary | ICD-10-CM | POA: Diagnosis not present

## 2024-11-14 DIAGNOSIS — E041 Nontoxic single thyroid nodule: Secondary | ICD-10-CM | POA: Diagnosis not present

## 2024-11-14 LAB — POC URINALSYSI DIPSTICK (AUTOMATED)
Bilirubin, UA: NEGATIVE
Glucose, UA: NEGATIVE
Ketones, UA: NEGATIVE
Leukocytes, UA: NEGATIVE
Nitrite, UA: NEGATIVE
Protein, UA: NEGATIVE
Spec Grav, UA: 1.01 (ref 1.010–1.025)
Urobilinogen, UA: 0.2 U/dL
pH, UA: 6 (ref 5.0–8.0)

## 2024-11-14 MED ORDER — MELATONIN 5 MG PO CAPS: 5.0000 mg | ORAL_CAPSULE | Freq: Every day | ORAL | Status: AC

## 2024-11-14 MED ORDER — CYANOCOBALAMIN 1000 MCG/ML IJ SOLN: 1000.0000 ug | Freq: Once | INTRAMUSCULAR | Status: AC

## 2024-11-14 NOTE — Progress Notes (Unsigned)
 Cardiac monitor results d/w pt.    Thyroid  nodule, bx pending.   Lung nodule on prev CT.  1. Enlarging slightly spiculated right upper lobe pulmonary nodule, mean 8 mm (up to 11 mm in one dimension). Recommend, per Fleischner Society Guidelines for a single solid 8-20 mm nodule, consideration of non-contrast chest CT at 3 months versus PET/CT and/or tissue sampling, taking into account risk factors and morphology.  Reasonable for f/u CT, ordered today for early 12/2024.   B12 dose given at OV.   UTI sx.  Prev dysuria, no sx now.  It went away.  No abd pain.  No fevers, no chills.    She still has L neck sx, L chest wall sx.  I need report from C spine MRI from 10/15/24.    [facility] [address] [place], [place] [address] [phone]  D/w pt about sleep and amitriptyline .  We talked about options, ie trying melatonin.  She has trouble sleeping w/o help.

## 2024-11-14 NOTE — Patient Instructions (Addendum)
 We'll update you about the urine labs.  Let me know if you can't get scheduled for the lung CT in Feb 2026- I put in the order today.   I'll await your thyroid  biopsy results.   Next B12 due on/after 12/16/24.    Let me see about the previous MRI report for your neck pain.   Try melatonin at night instead of amitriptyline .   Take care.  Glad to see you.

## 2024-11-15 LAB — URINE CULTURE
MICRO NUMBER:: 17373673
Result:: NO GROWTH
SPECIMEN QUALITY:: ADEQUATE

## 2024-11-15 LAB — URINALYSIS, MICROSCOPIC ONLY

## 2024-11-18 ENCOUNTER — Telehealth: Payer: Self-pay

## 2024-11-18 NOTE — Telephone Encounter (Signed)
 Scheduled 11/29/24--MRI review

## 2024-11-18 NOTE — Telephone Encounter (Signed)
 Melanie from answering service called and states Aloha from [facility]iology called wanting to let provider know about MRI Cspine results. Please review results.     [facility] Radiology CB [phone]

## 2024-11-19 ENCOUNTER — Telehealth: Payer: Self-pay | Admitting: Family Medicine

## 2024-11-19 ENCOUNTER — Ambulatory Visit: Payer: Self-pay | Admitting: Family Medicine

## 2024-11-19 ENCOUNTER — Ambulatory Visit (HOSPITAL_COMMUNITY)
Admission: RE | Admit: 2024-11-19 | Discharge: 2024-11-19 | Disposition: A | Discharge status: Home / Self Care | Source: Ambulatory Visit | Attending: Family Medicine | Admitting: Family Medicine

## 2024-11-19 ENCOUNTER — Encounter (HOSPITAL_COMMUNITY): Payer: Self-pay

## 2024-11-19 DIAGNOSIS — E041 Nontoxic single thyroid nodule: Secondary | ICD-10-CM | POA: Insufficient documentation

## 2024-11-19 DIAGNOSIS — R911 Solitary pulmonary nodule: Secondary | ICD-10-CM | POA: Insufficient documentation

## 2024-11-19 DIAGNOSIS — M542 Cervicalgia: Secondary | ICD-10-CM | POA: Insufficient documentation

## 2024-11-19 DIAGNOSIS — E042 Nontoxic multinodular goiter: Secondary | ICD-10-CM | POA: Diagnosis present

## 2024-11-19 MED ORDER — LIDOCAINE HCL 1 % IJ SOLN
INTRAMUSCULAR | Status: AC
  Filled 2024-11-19: qty 20

## 2024-11-19 NOTE — Procedures (Addendum)
 Ultrasound-guided FNA x 5 of right inferior thyroid  nodule and ultrasound-guided FNA x 5 of left inferior thyroid  nodule via 25 gauge needles performed without immediate complications.  Medication used -1% lidocaine  with epinephrine  to skin/subcutaneous tissue.  Final pathology pending. EBL < 2 cc.

## 2024-11-19 NOTE — Assessment & Plan Note (Signed)
 Biopsy pending.  See above.

## 2024-11-19 NOTE — Assessment & Plan Note (Signed)
 She still has L neck sx, L chest wall sx.  I need report from C spine MRI from 10/15/24.  This was done at an outside clinic.  See following phone note.

## 2024-11-19 NOTE — Assessment & Plan Note (Signed)
 Reasonable for f/u CT, ordered today for early 12/2024.  Rationale for follow-up discussed patient.

## 2024-11-19 NOTE — Assessment & Plan Note (Signed)
 See notes on urine culture/labs.

## 2024-11-19 NOTE — Assessment & Plan Note (Signed)
 B12 dose given at OV.  Discussed continuing replacement.

## 2024-11-19 NOTE — Assessment & Plan Note (Signed)
 Reasonable to try melatonin instead of amitriptyline .  She can update me as needed.

## 2024-11-19 NOTE — Telephone Encounter (Signed)
 Please update patient's daughter.  I was able to get the report on the MRI of her neck.  She has significant degenerative changes in her neck.  She has changes that could cause pain on the left side of her neck and into her left arm/chest wall.  Please have her check with the Ortho clinic about options going forward.    I saw that she had the thyroid  biopsy and I am awaiting the final report on that.  Thanks.

## 2024-11-20 NOTE — Telephone Encounter (Signed)
 Called and spoke with Monta, daughter on [place].  She reported that her mother has an appt at the ortho clinic next week.

## 2024-11-22 LAB — CYTOLOGY - NON PAP

## 2024-11-23 NOTE — Telephone Encounter (Signed)
 I'm covering for Dr [name]. Plz notify daughter - pathology report of both thyroid  nodules biopsied returned overall ok - no signs of cancer but very small sample size collected. Confirmatory testing was sent off that may take some time to return - will await Afirma testing for both nodules biopsied.

## 2024-11-25 NOTE — Telephone Encounter (Signed)
 Called patient's daughter reviewed all information and repeated back to me. Will call if any questions.

## 2024-11-29 ENCOUNTER — Encounter: Payer: Self-pay | Admitting: Physical Medicine and Rehabilitation

## 2024-11-29 ENCOUNTER — Ambulatory Visit: Admitting: Physical Medicine and Rehabilitation

## 2024-11-29 DIAGNOSIS — M4802 Spinal stenosis, cervical region: Secondary | ICD-10-CM

## 2024-11-29 DIAGNOSIS — G8929 Other chronic pain: Secondary | ICD-10-CM | POA: Diagnosis not present

## 2024-11-29 DIAGNOSIS — M25512 Pain in left shoulder: Secondary | ICD-10-CM

## 2024-11-29 DIAGNOSIS — M5412 Radiculopathy, cervical region: Secondary | ICD-10-CM

## 2024-11-29 NOTE — Progress Notes (Signed)
 Pain Scale   Average Pain 2 Patient advising she has chronic neck pain radiating to left arm pain comes and goes.        +Driver, -BT, -Dye Allergies.

## 2024-11-29 NOTE — Progress Notes (Signed)
 "  Kelly Kennedy - 84 y.o. female MRN [id number]  Date of birth: [birth year]  Office Visit Note: Visit Date: 11/29/2024 PCP: [name] Arlyss RAMAN, MD Referred by: [name] Arlyss RAMAN, MD  Subjective: Chief Complaint  Patient presents with   Neck - Pain   HPI: Kelly Kennedy is a 84 y.o. female who comes in today for evaluation of chronic, worsening and severe left sided neck pain radiating to shoulder and down to elbow. Patient is somewhat of a poor historian. She does have issues with memory loss. Her daughter is accompanying her during our visit today. Her pain is more intermittent in nature, no specific aggravating factors. She describes pain as heaviness sensation, reports as 2 out of 10. Some relief of pain with home exercise regimen, rest and use of medications.Recent cervical MRI imaging shows advanced cervical spine degeneration superimposed on chronic dextroconvex cervical scoliosis. There is moderate spinal stenosis at C3-C4 and spinal cord mass effect; but no definite cord signal abnormality. Multilevel moderate and severe neural foraminal stenosis, worst at the left C4, C5, right C6 and left C7 nerve levels.  She does have history of breast cancer. History of (2) left mastectomy surgeries with Dr. Vanderbilt in 2016. No history of cervical surgery/injections. Patient denies focal weakness, numbness and tingling. No recent trauma or falls.          Review of Systems  Musculoskeletal:  Positive for myalgias and neck pain.  Neurological:  Negative for tingling, sensory change, focal weakness and weakness.  All other systems reviewed and are negative.  Otherwise per HPI.  Assessment & Plan: Visit Diagnoses:    ICD-10-CM   1. Radiculopathy, cervical region  M54.12     2. Chronic left shoulder pain  M25.512    G89.29     3. Spinal stenosis of cervical region  M48.02     4. Foraminal stenosis of cervical region  M48.02        Plan: Findings:  Chronic, worsening and  severe left-sided neck pain radiating to shoulder and down to elbow.  Her pain has significantly improved over the last several months.  Pain is mild today.  Patient's clinical presentation and exam are consistent with cervical radiculopathy.  I discussed recent cervical MRI with patient today using imaging and spine model.  There is moderate spinal canal stenosis at C3-C4, spinal cord mass effect at this level but no definite cord signal abnormality.  There is also multilevel moderate and severe foraminal narrowing.  I do think these findings could be a pain generator for her.  Should her pain worsen would consider performing cervical epidural steroid injection.  I did discussed injection procedure in detail today.  She has no questions at this time.  Her exam today is nonfocal, good strength noted to bilateral upper extremities. Full ROM to bilateral shoulders, no signs of impingement. No myelopathic symptoms noted.  I did educate patient's daughter on symptoms of myelopathy including hand weakness, difficulty walking, paresthesias in the arms/hands and difficulty with motor function.  I instructed her to contact our office if her mother exhibits any of the symptoms.  We are happy to see her back as needed.    Meds & Orders: No orders of the defined types were placed in this encounter.  No orders of the defined types were placed in this encounter.   Follow-up: Return if symptoms worsen or fail to improve.   Procedures: No procedures performed      Clinical History: EXAM: MRI  CERVICAL SPINE WITHOUT CONTRAST 10/15/2024 06:05:09 PM   TECHNIQUE: Multiplanar multisequence MRI of the cervical spine was performed.   COMPARISON: Cervical spine radiographs 09/24/2024. CT cervical spine 07/27/2018.   CLINICAL HISTORY: This study is identified as missing a report in the afternoon on 11/16/2024.   84 year old female. Pain radiating down the left arm for 4 months. Neck pain. History of  breast cancer.   FINDINGS:   BONES AND ALIGNMENT: Chronic dextroconvex cervical scoliosis. Chronic straightening and mild reversal of cervical lordosis. Since 2019, degenerative retrolisthesis has developed at C3-C4, approximately 3 mm. Progressed chronic disc, endplate, and posterior element degeneration at C3-C4, with superimposed mild degenerative endplate marrow edema there (series 304 image 6). Superimposed faint degenerative marrow edema also affecting the adjacent chronically degenerated C4-C5 facets on the left side (series 304 image 11). Chronic degenerative endplate erosion at C3-C4 is mild. Relatively maintained vertebral height. Normal background bone marrow signal. No other marrow edema. Negative cervicomedullary junction and visible posterior fossa. No acute traumatic injury identified in the cervical spine. No acute or inflammatory process in the cervical spine. Correlation with radiographs is recommended prior to any operative intervention.   SPINAL CORD: Normal spinal cord size. No abnormal spinal cord signal.   SOFT TISSUES: No paraspinal mass. Preserved major vascular flow voids in the bilateral neck. Partially retropharyngeal course of the right carotid artery incidentally noted (normal variant series 306 image 7). Negative visible neck soft tissues, lung apices.   DEGENERATIVE:   C2-C3: Negative.   C3-C4: Severe disc space loss. Circumferential disc osteophyte complex asymmetric to the left. Moderate facet and ligamentum flavum hypertrophy also greater on the left. Moderate spinal stenosis and mild to moderate spinal cord mass effect (series 306 image 9 and series 302 image 6). No definite abnormal cord signal is associated. Severe left and mild to moderate right C4 neural foraminal stenosis.   C4-C5: Circumferential disc osteophyte complex asymmetric to the left. Left side moderate to severe facet hypertrophy. No significant spinal stenosis  (series 306 image 12). Severe left C5 neural foraminal stenosis. Mild right foraminal stenosis.   C5-C6: Circumferential disc osteophyte complex asymmetric to the right (series 306 image 17). Moderate to severe ligamentum flavum hypertrophy, moderate facet hypertrophy greater on the left. Mild spinal stenosis mild right hemicord mass effect. No cord signal abnormality. Moderate left and moderate to severe right C6 neural foraminal stenosis.   C6-C7: Disc space loss. Circumferential disc osteophyte complex is more symmetric. Mild to moderate facet and ligamentum flavum hypertrophy. Mild spinal stenosis (series 302 image 7). No cord mass effect. Severe left and moderate to severe right C7 foraminal stenosis.   C7-T1: Mild disc bulging and endplate spurring asymmetric to the left. Mild to moderate facet and ligamentum flavum hypertrophy. No spinal stenosis. Mild left C8 neural foraminal stenosis.   Upper thoracic spine degeneration visible on the sagittal images, with up to mild associated thoracic spinal stenosis at T2-T3, T4-T5 (series 302 image 7).   IMPRESSION: 1. Advanced cervical spine degeneration superimposed on chronic dextroconvex cervical scoliosis. 2. Since 2019 Substantially progressed and now Severe disc and endplate degeneration at C3-C4 with new retrolisthesis. Subsequent Moderate spinal stenosis AND spinal cord mass effect; but no definite cord signal abnormality. 3. Mild multifactorial spinal stenosis also at C5-C6 and C6-C7. 4. Multilevel moderate and severe neural foraminal stenosis, worst at the left C4, C5, right C6 and left C7 nerve levels.   Electronically signed by: Helayne Hurst MD 11/18/2024 11:44 AM EST RP Workstation: [id number]  She reports that she quit smoking about 7 years ago. Her smoking use included cigarettes. She has quit using smokeless tobacco.  Recent Labs    12/04/23 1705 09/09/24 1629  HGBA1C 5.7* 6.4    Objective:  VS:  HT:    WT:    BMI:     BP:   HR: bpm  TEMP: ( )  RESP:  Physical Exam Vitals and nursing note reviewed.  HENT:     Head: Normocephalic and atraumatic.     Right Ear: External ear normal.     Left Ear: External ear normal.     Nose: Nose normal.     Mouth/Throat:     Mouth: Mucous membranes are moist.  Eyes:     Extraocular Movements: Extraocular movements intact.  Cardiovascular:     Rate and Rhythm: Normal rate.     Pulses: Normal pulses.  Pulmonary:     Effort: Pulmonary effort is normal.  Abdominal:     General: Abdomen is flat. There is no distension.  Musculoskeletal:        General: Tenderness present.     Cervical back: Tenderness present.     Comments: Discomfort noted with flexion and extension of neck. Patient has good strength in the upper extremities including 5 out of 5 strength in wrist extension, long finger flexion and APB. Shoulder range of motion is full bilaterally without any sign of impingement. There is no atrophy of the hands intrinsically. Sensation intact bilaterally. Negative Hoffman's sign. Negative Spurling's sign.     Skin:    General: Skin is warm and dry.     Capillary Refill: Capillary refill takes less than 2 seconds.  Neurological:     General: No focal deficit present.     Mental Status: She is alert and oriented to person, place, and time.  Psychiatric:        Mood and Affect: Mood normal.        Behavior: Behavior normal.     Ortho Exam  Imaging: No results found.  Past Medical/Family/Surgical/Social History: Medications & Allergies reviewed per EMR, new medications updated. Patient Active Problem List   Diagnosis Date Noted   Thyroid  nodule 11/19/2024   Lung nodule 11/19/2024   Neck pain 11/19/2024   History of loss of consciousness 09/25/2024   Syncope 09/11/2024   CKD stage 3a, GFR 45-59 ml/min (HCC) 09/11/2024   Hypokalemia 09/11/2024   Vitamin D  deficiency 09/11/2024   Breast symptom 04/04/2024   Mood change 04/04/2024   History  of UTI 12/06/2023   Dark stools 07/05/2023   Vertigo 07/05/2023   Knee pain 02/09/2023   Fall 02/09/2023   Pulmonary embolism (HCC) 11/25/2022   DVT (deep venous thrombosis) (HCC) 11/25/2022   NSTEMI (non-ST elevated myocardial infarction) (HCC) 11/22/2022   Anemia 06/02/2021   Memory loss 02/24/2021   Dysuria 02/24/2021   B12 deficiency 02/24/2021   Dysphagia 10/28/2020   Abnormality on screening test 03/08/2020   PVCs (premature ventricular contractions) 10/11/2018   Coronary artery disease 09/12/2018   Pulmonary nodule 08/08/2018   Fatty liver 08/05/2018   Abnormal prominence of clavicle 03/24/2017   SUI (stress urinary incontinence, female) 12/20/2016   Neoplasm of left breast, primary tumor staging category Tis: ductal carcinoma in situ (DCIS) 08/06/2015   Malignant neoplasm of upper-outer quadrant of left breast in female, estrogen receptor positive (HCC) 06/10/2015   History of breast cancer 05/14/2015   HLD (hyperlipidemia) 12/11/2014   History of diabetes mellitus 12/11/2014   Essential  hypertension 09/28/2014   ? History of TIA (transient ischemic attack) 09/28/2014   Osteopenia 07/09/2014   Advance care planning 07/06/2014   Obesity, unspecified 07/03/2013   Anxiety state 07/03/2013   Medicare annual wellness visit, subsequent 05/27/2012   MENOPAUSAL SYNDROME 06/15/2010   Insomnia 06/15/2010   Non-insulin  dependent type 2 diabetes mellitus (HCC) 09/19/2008   GERD 09/16/2008   Hypothyroidism 08/02/2007   DEPENDENT EDEMA 08/02/2007   SYSTOLIC MURMUR 07/17/2007   ANGINA, HX OF 07/17/2007   Diverticulosis of colon 06/28/2005   Past Medical History:  Diagnosis Date   adenomatous Colon polyps    colonoscopy 8/99, 12/02, 8/06   Arthritis    Bell's palsy    Cancer (HCC)    DCIS left breast   Cataract    Chest pain    uses NTG as needed   Depression    Diabetes mellitus without complication (HCC)    Diverticulosis of colon 06/2005   Ductal carcinoma in situ  (DCIS) of left breast    Family history of adverse reaction to anesthesia     my mother didn't wake up so they had to put her on life support for about an hour or more; my sisiter has problems waking up too from anesthesia.   GERD (gastroesophageal reflux disease)    Glaucoma    H/O hiatal hernia    Headache(784.0)    Hyperglycemia    Hyperlipidemia    Hypertension    Hypothyroid    Insomnia    Irritable bowel    Menopausal symptoms    Pericarditis 1980's   Stroke [facility])    TIA (transient ischemic attack)    09/2014   Family History  Problem Relation Age of Onset   Cancer Father        stomach    Stomach cancer Father    Colon cancer Father    Heart disease Son        MI   Arthritis Mother    Diabetes Mother    Cancer Brother        lung cancer   Pancreatic cancer Neg Hx    Breast cancer Neg Hx    Esophageal cancer Neg Hx    Rectal cancer Neg Hx    Past Surgical History:  Procedure Laterality Date   APPENDECTOMY     BREAST EXCISIONAL BIOPSY Right    benign   BREAST RECONSTRUCTION WITH PLACEMENT OF TISSUE EXPANDER AND FLEX HD (ACELLULAR HYDRATED DERMIS) Left 08/06/2015   Procedure: LEFT BREAST RECONSTRUCTION WITH PLACEMENT OF SALINE IMPLANT AND ACELLULAR DURMAL MATRIX;  Surgeon: Alm Sick, MD;  Location: [facility] OR;  Service: Plastics;  Laterality: Left;   CARDIAC CATHETERIZATION     CATARACT EXTRACTION Bilateral 2016   CHOLECYSTECTOMY  1990   COLONOSCOPY     CYSTECTOMY  05/04/2008   left lower arm, benign   ECTOPIC PREGNANCY SURGERY  1971   FRACTURE SURGERY     nose   LEFT HEART CATH AND CORONARY ANGIOGRAPHY [name] 09/21/2018   Procedure: LEFT HEART CATH AND CORONARY ANGIOGRAPHY;  Surgeon: Anner Alm ORN, MD;  Location: [facility] INVASIVE CV LAB;  Service: Cardiovascular;  Laterality: [name];   LEFT HEART CATH AND CORONARY ANGIOGRAPHY [name] 11/23/2022   Procedure: LEFT HEART CATH AND CORONARY ANGIOGRAPHY;  Surgeon: Wonda Sharper, MD;  Location: [facility] INVASIVE CV LAB;  Service:  Cardiovascular;  Laterality: [name];   MASTECTOMY Left 2016   NIPPLE SPARING MASTECTOMY Left 08/06/2015   Procedure: LEFT NIPPLE SPARING MASTECTOMY;  Surgeon: Debby  Cornett, MD;  Location: [facility] OR;  Service: General;  Laterality: Left;   ORIF ANKLE FRACTURE Left 10/23/2013   Procedure: OPEN REDUCTION INTERNAL FIXATION (ORIF) LEFT ANKLE FRACTURE;  Surgeon: Kay Ozell Cummins, MD;  Location: [facility] OR;  Service: Orthopedics;  Laterality: Left;   PARTIAL HYSTERECTOMY     ovaries intact   PARTIAL MASTECTOMY WITH NEEDLE LOCALIZATION AND AXILLARY SENTINEL LYMPH NODE BX Left 05/06/2015   Procedure: LEFT BREAST PARTIAL MASTECTOMY WITH NEEDLE LOCALIZATION AND SENTINEL LYMPH NODE MAPPING;  Surgeon: Debby Shipper, MD;  Location: [facility];  Service: General;  Laterality: Left;   TONSILLECTOMY     TUBAL LIGATION     Social History   Occupational History   Occupation: retired, previous scientist, product/process development  Tobacco Use   Smoking status: Former    Current packs/day: 0.00    Types: Cigarettes    Quit date: 04/03/2017    Years since quitting: 7.6   Smokeless tobacco: Former   Tobacco comments:    PT STATES SHE DOES NOT SMOKE.  Vaping Use   Vaping status: Never Used  Substance and Sexual Activity   Alcohol  use: No    Alcohol /week: 0.0 standard drinks of alcohol    Drug use: No   Sexual activity: Never   "

## 2024-12-01 NOTE — Telephone Encounter (Signed)
 Noted. Thanks.

## 2024-12-04 ENCOUNTER — Ambulatory Visit

## 2024-12-04 DIAGNOSIS — G8929 Other chronic pain: Secondary | ICD-10-CM

## 2024-12-04 DIAGNOSIS — R262 Difficulty in walking, not elsewhere classified: Secondary | ICD-10-CM

## 2024-12-04 DIAGNOSIS — M6281 Muscle weakness (generalized): Secondary | ICD-10-CM

## 2024-12-04 DIAGNOSIS — M25561 Pain in right knee: Secondary | ICD-10-CM

## 2024-12-04 NOTE — Therapy (Signed)
 " OUTPATIENT PHYSICAL THERAPY TREATMENT   Patient Name: Kelly Kennedy MRN: [id number] DOB:Feb 02, 1941, 84 y.o., female Today's Date: 12/04/2024  END OF SESSION:  PT End of Session - 12/04/24 1610     Visit Number 2    Number of Visits 20    Date for Recertification  01/21/25    Authorization Type U[employer] Dual, no auth needed , 20% coinsurance    PT Start Time 1550    PT Stop Time 1628    PT Time Calculation (min) 38 min    Activity Tolerance Patient tolerated treatment well    Behavior During Therapy [facility] for tasks assessed/performed           Past Medical History:  Diagnosis Date   adenomatous Colon polyps    colonoscopy 8/99, 12/02, 8/06   Arthritis    Bell's palsy    Cancer (HCC)    DCIS left breast   Cataract    Chest pain    uses NTG as needed   Depression    Diabetes mellitus without complication (HCC)    Diverticulosis of colon 06/2005   Ductal carcinoma in situ (DCIS) of left breast    Family history of adverse reaction to anesthesia     my mother didn't wake up so they had to put her on life support for about an hour or more; my sisiter has problems waking up too from anesthesia.   GERD (gastroesophageal reflux disease)    Glaucoma    H/O hiatal hernia    Headache(784.0)    Hyperglycemia    Hyperlipidemia    Hypertension    Hypothyroid    Insomnia    Irritable bowel    Menopausal symptoms    Pericarditis 1980's   Stroke [facility])    TIA (transient ischemic attack)    09/2014   Past Surgical History:  Procedure Laterality Date   APPENDECTOMY     BREAST EXCISIONAL BIOPSY Right    benign   BREAST RECONSTRUCTION WITH PLACEMENT OF TISSUE EXPANDER AND FLEX HD (ACELLULAR HYDRATED DERMIS) Left 08/06/2015   Procedure: LEFT BREAST RECONSTRUCTION WITH PLACEMENT OF SALINE IMPLANT AND ACELLULAR DURMAL MATRIX;  Surgeon: Alm Sick, MD;  Location: [facility] OR;  Service: Plastics;  Laterality: Left;   CARDIAC CATHETERIZATION     CATARACT EXTRACTION Bilateral 2016    CHOLECYSTECTOMY  1990   COLONOSCOPY     CYSTECTOMY  05/04/2008   left lower arm, benign   ECTOPIC PREGNANCY SURGERY  1971   FRACTURE SURGERY     nose   LEFT HEART CATH AND CORONARY ANGIOGRAPHY [name] 09/21/2018   Procedure: LEFT HEART CATH AND CORONARY ANGIOGRAPHY;  Surgeon: Anner Alm ORN, MD;  Location: [facility] INVASIVE CV LAB;  Service: Cardiovascular;  Laterality: [name];   LEFT HEART CATH AND CORONARY ANGIOGRAPHY [name] 11/23/2022   Procedure: LEFT HEART CATH AND CORONARY ANGIOGRAPHY;  Surgeon: Wonda Sharper, MD;  Location: [facility] INVASIVE CV LAB;  Service: Cardiovascular;  Laterality: [name];   MASTECTOMY Left 2016   NIPPLE SPARING MASTECTOMY Left 08/06/2015   Procedure: LEFT NIPPLE SPARING MASTECTOMY;  Surgeon: Debby Shipper, MD;  Location: [facility] OR;  Service: General;  Laterality: Left;   ORIF ANKLE FRACTURE Left 10/23/2013   Procedure: OPEN REDUCTION INTERNAL FIXATION (ORIF) LEFT ANKLE FRACTURE;  Surgeon: Kay Sharper Cummins, MD;  Location: [facility] OR;  Service: Orthopedics;  Laterality: Left;   PARTIAL HYSTERECTOMY     ovaries intact   PARTIAL MASTECTOMY WITH NEEDLE LOCALIZATION AND AXILLARY SENTINEL LYMPH NODE BX Left 05/06/2015  Procedure: LEFT BREAST PARTIAL MASTECTOMY WITH NEEDLE LOCALIZATION AND SENTINEL LYMPH NODE MAPPING;  Surgeon: Debby Shipper, MD;  Location: [facility];  Service: General;  Laterality: Left;   TONSILLECTOMY     TUBAL LIGATION     Patient Active Problem List   Diagnosis Date Noted   Thyroid  nodule 11/19/2024   Lung nodule 11/19/2024   Neck pain 11/19/2024   History of loss of consciousness 09/25/2024   Syncope 09/11/2024   CKD stage 3a, GFR 45-59 ml/min (HCC) 09/11/2024   Hypokalemia 09/11/2024   Vitamin D  deficiency 09/11/2024   Breast symptom 04/04/2024   Mood change 04/04/2024   History of UTI 12/06/2023   Dark stools 07/05/2023   Vertigo 07/05/2023   Knee pain 02/09/2023   Fall 02/09/2023   Pulmonary embolism (HCC) 11/25/2022   DVT (deep venous  thrombosis) (HCC) 11/25/2022   NSTEMI (non-ST elevated myocardial infarction) (HCC) 11/22/2022   Anemia 06/02/2021   Memory loss 02/24/2021   Dysuria 02/24/2021   B12 deficiency 02/24/2021   Dysphagia 10/28/2020   Abnormality on screening test 03/08/2020   PVCs (premature ventricular contractions) 10/11/2018   Coronary artery disease 09/12/2018   Pulmonary nodule 08/08/2018   Fatty liver 08/05/2018   Abnormal prominence of clavicle 03/24/2017   SUI (stress urinary incontinence, female) 12/20/2016   Neoplasm of left breast, primary tumor staging category Tis: ductal carcinoma in situ (DCIS) 08/06/2015   Malignant neoplasm of upper-outer quadrant of left breast in female, estrogen receptor positive (HCC) 06/10/2015   History of breast cancer 05/14/2015   HLD (hyperlipidemia) 12/11/2014   History of diabetes mellitus 12/11/2014   Essential hypertension 09/28/2014   ? History of TIA (transient ischemic attack) 09/28/2014   Osteopenia 07/09/2014   Advance care planning 07/06/2014   Obesity, unspecified 07/03/2013   Anxiety state 07/03/2013   Medicare annual wellness visit, subsequent 05/27/2012   MENOPAUSAL SYNDROME 06/15/2010   Insomnia 06/15/2010   Non-insulin  dependent type 2 diabetes mellitus (HCC) 09/19/2008   GERD 09/16/2008   Hypothyroidism 08/02/2007   DEPENDENT EDEMA 08/02/2007   SYSTOLIC MURMUR 07/17/2007   ANGINA, HX OF 07/17/2007   Diverticulosis of colon 06/28/2005    PCP: [name] Arlyss EDISON MD  REFERRING PROVIDER: Georgina Ozell LABOR, MD  REFERRING DIAG: [phone] (ICD-10-CM) - Right knee pain, unspecified chronicity  THERAPY DIAG:  Chronic pain of right knee  Muscle weakness (generalized)  Difficulty in walking, not elsewhere classified  Rationale for Evaluation and Treatment: Rehabilitation  ONSET DATE: 2024   SUBJECTIVE:   SUBJECTIVE STATEMENT: Pt states she has been doing HEP daily.  Pt indicated sleeping ok but waking with symptoms.   PERTINENT  HISTORY: Depression, history of Lt breast cancer, DM, depression, GERD, Hyperglycemia, hyperlipidemia, HTN, history of TIA, Stroke.   Injection on 10/21/2024.     PAIN:  NPRS scale: 3/10 today Pain location: Rt knee pain Pain description: achy, sharp at times Aggravating factors: prolonged WB activity, walking, steps, squatting.  Relieving factors: injection,   PRECAUTIONS: None  WEIGHT BEARING RESTRICTIONS: No  FALLS:  Has patient fallen in last 6 months? No  LIVING ENVIRONMENT: Lives with: Lives with sister  Lives in: House/apartment Stairs: no stairs inside.  Stairs to enter side door 3 steps, Rt rail going up.    2 steps to enter front door without rails.  Has following equipment at home: [facility], no walker   OCCUPATION: Retired  PLOF: Independent, housework.    PATIENT GOALS: Reduce pain, walking.   OBJECTIVE:   DIAGNOSTIC FINDINGS:  Knee Rt xrays 10/21/2024: XRs of the right knee from 10/21/2024 were independently reviewed and  interpreted, showing medial joint space narrowing and subchondral  sclerosis. Small osteophyte in the patellofemoral compartment. No fracture  or dislocation seen.   PATIENT SURVEYS:  Patient-Specific Activity Scoring Scheme  0 represents unable to perform. 10 represents able to perform at prior level. 0 1 2 3 4 5 6 7 8 9  10 (Date and Score)   Activity Eval  11/12/2024    1. Walking   5    2. Tub transfers   7    3. Kneeling (without help)  0   4. Stairs  4   5.    Score 4 avg    Total score = sum of the activity scores/number of activities Minimum detectable change (90%CI) for average score = 2 points Minimum detectable change (90%CI) for single activity score = 3 points  COGNITION: 11/12/2024 Overall cognitive status: WFL    SENSATION: 11/12/2024 WFL  EDEMA:  11/12/2024 General bilateral LE edema noted in distal LE.   MUSCLE LENGTH: 11/12/2024 None tested  POSTURE:  11/12/2024 Unremarkable for  condition  PALPATION: 11/12/2024 Mild tenderness posterior knee in extension stretch.   LOWER EXTREMITY ROM:   ROM Right Eval 11/12/2024 Left Eval 11/12/2024  Hip flexion    Hip extension    Hip abduction    Hip adduction    Hip internal rotation    Hip external rotation    Knee flexion 116 AROM in supine heel slide 122 AROM in supine heel slide  Knee extension -4 heel prop PROM   Ankle dorsiflexion    Ankle plantarflexion    Ankle inversion    Ankle eversion     (Blank rows = not tested)  LOWER EXTREMITY MMT:  MMT Right Eval 11/12/2024 Left Eval 11/12/2024  Hip flexion 5/5 5/5  Hip extension    Hip abduction    Hip adduction    Hip internal rotation    Hip external rotation    Knee flexion 5/5 5/5  Knee extension 4/5 5/5  Ankle dorsiflexion 5/5 5/5  Ankle plantarflexion    Ankle inversion    Ankle eversion     (Blank rows = not tested)  LOWER EXTREMITY SPECIAL TESTS:  11/12/2024 None tested  FUNCTIONAL TESTS:  11/12/2024 18 inch chair transfer: able to perform s UE assist on 1st try Lt SLS: 4 seconds Rt SLS: 5 seconds  GAIT: 11/12/2024 Independent ambulation, mild variance in stance on Rt leg at times in ambulation.  TODAY'S TREATMENT            R knee                                                                DATE:  12/04/24 Nustep seat 9 level 4 6 min Sit to stand holding 2# ball 10x at 22 inches 10x without ball , adding step forward Seated SLR 10x no weight   10x2 with 1# Quad set towel under ankle 3x10 with 3 sec hold Seated LAQ 10x no weight  2x10 1# Seated Marching 20x 2 Manual hamstring stretching 11/12/2024 Therex:    HEP instruction/performance c cues for techniques, handout provided.  Trial set performed of each for comprehension and symptom assessment.  See below  for exercise list  PATIENT EDUCATION:  11/12/2024 Education details: HEP, POC Person educated: Patient Education method: Explanation, Demonstration, Verbal cues, and Handouts Education comprehension: verbalized understanding, returned demonstration, and verbal cues required  HOME EXERCISE PROGRAM: Access Code: [email or web address] URL: [email or web address] Date: 11/12/2024 Prepared by: Ozell Silvan  Exercises - Seated Long Arc Quad  - 2-3 x daily - 7 x weekly - 1-2 sets - 10 reps - 2 hold - Supine Heel Slide  - 2-3 x daily - 7 x weekly - 1-2 sets - 10 reps - 2 hold - Long Sitting Quad Set with Towel Roll Under Heel  - 1-2 x daily - 7 x weekly - 1 sets - 10 reps - 5 hold - Seated Quad Set  - 1-2 x daily - 7 x weekly - 1 sets - 10 reps - 5 hold - Seated Straight Leg Raise   - 1-2 x daily - 7 x weekly - 2-3 sets - 8-15 reps - Sit to Stand  - 3 x daily - 7 x weekly - 1 sets - 10 reps  ASSESSMENT:  CLINICAL IMPRESSION: Patient presented with mod> severe R hamstring tightness.  Addressed with manual stretching today.   OBJECTIVE IMPAIRMENTS: Abnormal gait, decreased activity tolerance, decreased balance, decreased coordination, decreased endurance, decreased mobility, difficulty walking, decreased ROM, decreased strength, increased edema, increased fascial restrictions, impaired perceived functional ability, impaired flexibility, improper body mechanics, and pain.   ACTIVITY LIMITATIONS: carrying, lifting, bending, standing, squatting, stairs, transfers, bathing, and locomotion level  PARTICIPATION LIMITATIONS: meal prep, cleaning, laundry, interpersonal relationship, shopping, and community activity  PERSONAL FACTORS: Time since onset of injury/illness/exacerbation and Depression, history of Lt breast cancer, DM, depression, GERD, Hyperglycemia, hyperlipidemia, HTN, history of TIA, Stroke.  are also affecting patient's functional outcome.   REHAB POTENTIAL: Good  CLINICAL  DECISION MAKING: Stable/uncomplicated  EVALUATION COMPLEXITY: Low   GOALS: Goals reviewed with patient? Yes  SHORT TERM GOALS: (target date for Short term goals are 3 weeks 12/03/2024)   1.  Patient will demonstrate independent use of home exercise program to maintain progress from in clinic treatments.  Goal status: New  LONG TERM GOALS: (target dates for all long term goals are 10 weeks  01/21/2025 )   1. Patient will demonstrate/report pain at worst less than or equal to 2/10 to facilitate minimal limitation in daily activity secondary to pain symptoms.  Goal status: New   2. Patient will demonstrate independent use of home exercise program to facilitate ability to maintain/progress functional gains from skilled  physical therapy services.  Goal status: New   3. Patient will demonstrate Patient specific functional scale avg > or = 8/10 to indicate reduced disability due to condition.   Goal status: New   4.  Patient will demonstrate Rt  LE MMT 5/5 , dynamometry with 10% of Lt for knee extension to faciltiate usual transfers, stairs, squatting at PLOF for daily life.    Goal status: New   5.  Patient will demonstrate bilateral SLS > 10 seconds for improved stability in ambulation.  Goal status: New   6.  Patient will demonstrate ascending/descending stairs reciprocally s UE assist for community integration.   Goal status: New    PLAN:  PT FREQUENCY: 1-2x/week  PT DURATION: 10 weeks  PLANNED INTERVENTIONS: Can include 02853- PT Re-evaluation, 97110-Therapeutic exercises, 97530- Therapeutic activity, [id number]- Neuromuscular re-education, 97535- Self Care, 97140- Manual therapy, [phone]- Gait training, [phone]- Orthotic Fit/training, [phone]- Canalith repositioning, [id number]- Aquatic Therapy, [phone]- Electrical stimulation (unattended), [id number] Physical performance testing, 97016- Vasopneumatic device, [id number]- Ultrasound, [id number]- Traction (mechanical), [id number]- Ionotophoresis 4mg /ml  Dexamethasone ,  79439 - Needle insertion w/o injection 1 or 2 muscles, 20561 - Needle insertion w/o injection 3 or more muscles.   Patient/Family education, Balance training, Stair training, Taping, Dry Needling, Joint mobilization, Joint manipulation, Spinal manipulation, Spinal mobilization, Scar mobilization, Vestibular training, Visual/preceptual remediation/compensation, DME instructions, Cryotherapy, and Moist heat.  All performed as medically necessary.  All included unless contraindicated  PLAN FOR NEXT SESSION:   Phase in hamstring stretch for HEP.  Quad strengthening.   Burnard Meth, PT 12/04/2024  4:31 PM    "

## 2024-12-06 ENCOUNTER — Encounter: Payer: Self-pay | Admitting: Family Medicine

## 2024-12-06 ENCOUNTER — Other Ambulatory Visit: Payer: Self-pay

## 2024-12-06 ENCOUNTER — Ambulatory Visit: Admitting: Family Medicine

## 2024-12-06 ENCOUNTER — Ambulatory Visit: Payer: Self-pay

## 2024-12-06 VITALS — BP 134/72 | HR 78 | Temp 98.4°F | Ht 65.0 in | Wt 160.0 lb

## 2024-12-06 DIAGNOSIS — R300 Dysuria: Secondary | ICD-10-CM

## 2024-12-06 DIAGNOSIS — N3000 Acute cystitis without hematuria: Secondary | ICD-10-CM

## 2024-12-06 LAB — POC URINALSYSI DIPSTICK (AUTOMATED)
Bilirubin, UA: NEGATIVE
Blood, UA: POSITIVE
Glucose, UA: NEGATIVE
Ketones, UA: NEGATIVE
Nitrite, UA: NEGATIVE
Protein, UA: POSITIVE — AB
Spec Grav, UA: 1.025
Urobilinogen, UA: 0.2 U/dL
pH, UA: 6

## 2024-12-06 MED ORDER — CEPHALEXIN 500 MG PO CAPS: 500.0000 mg | ORAL_CAPSULE | Freq: Two times a day (BID) | ORAL | 0 refills | Status: DC

## 2024-12-06 NOTE — Patient Instructions (Signed)
 Visit Information  Thank you for taking time to visit with me today. Please don't hesitate to contact me if I can be of assistance to you before our next scheduled appointment.  Your next care management appointment is by telephone on 01/03/2025 at 9:30 am   Telephone follow-up in 1 month  Please call the care guide team at [phone] if you need to cancel, schedule, or reschedule an appointment.   Please call the Suicide and Crisis Lifeline: 988 call the [place]  National Suicide Prevention Lifeline: 548-485-4803 or TTY: 321-515-6019 TTY 224-801-3646) to talk to a trained counselor call 1-800-273-TALK (toll free, 24 hour hotline) go to [facility] [address] [place] [phone]) call 911 if you are experiencing a Mental Health or Behavioral Health Crisis or need someone to talk to.  [name], MSN, RN [facility], [facility] RN Care Manager Direct Dial: [phone] Fax: [phone]

## 2024-12-06 NOTE — Telephone Encounter (Signed)
 Thank you for getting this patient scheduled.

## 2024-12-06 NOTE — Patient Instructions (Addendum)
 Urine suspicious for infection. Start keflex  500mg  twice daily for 1 week. Ensure staying well hydrated with water.  Avoid bladder irritants like dark sodas, spicy foods, caffeine. Urine culture sent Let us  know if not improving with treatment.

## 2024-12-06 NOTE — Progress Notes (Signed)
 " Ph: [phone] Fax: [phone]   Patient ID: Kelly Kennedy, female    DOB: May 31, 1941, 84 y.o.   MRN: [id number]  This visit was conducted in person.  BP 134/72 (BP Location: Right Arm, Patient Position: Sitting, Cuff Size: Normal)   Pulse 78   Temp 98.4 F (36.9 C) (Oral)   Ht 5' 5 (1.651 m)   Wt 160 lb (72.6 kg)   SpO2 96%   BMI 26.63 kg/m    CC: possible UTI  Subjective:   HPI: Kelly Kennedy is a 84 y.o. female presenting on 12/06/2024 for Acute Visit (Burning when peeing, nocturia, frequency, abnormal urine color, brain fog/confused and annoyed , onset 11/29/24//Pt states she is not diabetic but has Hx of it in chart)   1 wk h/o possible UTI symptoms including dysuria, nocturia, urinary, frequency, darker urine color, confusion/irritability and increased brain fog. Notes some R flank pain  No fevers/chills, nausea/vomiting, abd pain, flank pain, or blood in urine.  Remote h/o kidney stone.  No recent abx use.      Relevant past medical, surgical, family and social history reviewed and updated as indicated. Interim medical history since our last visit reviewed. Allergies and medications reviewed and updated. Outpatient Medications Prior to Visit  Medication Sig Dispense Refill   amLODipine  (NORVASC ) 10 MG tablet Take 1 tablet (10 mg total) by mouth daily. 90 tablet 0   carvedilol  (COREG ) 12.5 MG tablet Take 1 tablet (12.5 mg total) by mouth 2 (two) times daily. 180 tablet 3   cholestyramine  (QUESTRAN ) 4 g packet Take 1 packet (4 g total) by mouth 3 (three) times daily as needed. 180 packet 1   cyanocobalamin  (,VITAMIN B-12,) 1000 MCG/ML injection Inject 1 mL (1,000 mcg total) into the muscle every 30 (thirty) days.     diclofenac  Sodium (VOLTAREN ) 1 % GEL Apply 2 g topically 4 (four) times daily as needed. 300 g 3   Evolocumab  (REPATHA  SURECLICK) 140 MG/ML SOAJ ADMINISTER 1 ML UNDER THE SKIN EVERY 14 DAYS 6 mL 3   isosorbide  mononitrate (IMDUR ) 30 MG 24  hr tablet Take 1 tablet (30 mg total) by mouth daily. 90 tablet 3   levothyroxine  (SYNTHROID ) 75 MCG tablet TAKE 1 TABLET BY MOUTH EVERY DAY BEFORE BREAKFAST 90 tablet 3   LUMIGAN 0.01 % SOLN Place 1 drop into both eyes at bedtime.     Melatonin 5 MG CAPS Take 1 capsule (5 mg total) by mouth at bedtime.     memantine  (NAMENDA ) 10 MG tablet TAKE 1 TABLET BY MOUTH TWICE A DAY 180 tablet 1   nitroGLYCERIN  (NITROSTAT ) 0.4 MG SL tablet Place 1 tablet (0.4 mg total) under the tongue every 5 (five) minutes as needed for chest pain. 25 tablet 5   omeprazole  (PRILOSEC) 40 MG capsule TAKE 1 CAPSULE (40 MG TOTAL) BY MOUTH DAILY. 90 capsule 1   solifenacin  (VESICARE ) 5 MG tablet TAKE 1 TABLET (5 MG TOTAL) BY MOUTH DAILY. 90 tablet 1   Vitamin D , Ergocalciferol , (DRISDOL ) 1.25 MG (50000 UNIT) CAPS capsule Take 1 capsule by mouth once weekly for 12 weeks. 12 capsule 0   Facility-Administered Medications Prior to Visit  Medication Dose Route Frequency Provider Last Rate Last Admin   cyanocobalamin  (VITAMIN B12) injection 1,000 mcg  1,000 mcg Intramuscular Once Duncan, Graham S, MD         Per HPI unless specifically indicated in ROS section below Review of Systems  Objective:  BP 134/72 (BP Location: Right  Arm, Patient Position: Sitting, Cuff Size: Normal)   Pulse 78   Temp 98.4 F (36.9 C) (Oral)   Ht 5' 5 (1.651 m)   Wt 160 lb (72.6 kg)   SpO2 96%   BMI 26.63 kg/m   Wt Readings from Last 3 Encounters:  12/06/24 160 lb (72.6 kg)  11/14/24 168 lb (76.2 kg)  11/08/24 166 lb (75.3 kg)      Physical Exam Vitals and nursing note reviewed.  Constitutional:      Appearance: Normal appearance. She is not ill-appearing.  HENT:     Mouth/Throat:     Mouth: Mucous membranes are moist.     Pharynx: Oropharynx is clear. No oropharyngeal exudate or posterior oropharyngeal erythema.  Eyes:     Extraocular Movements: Extraocular movements intact.     Pupils: Pupils are equal, round, and reactive to  light.  Abdominal:     General: Abdomen is flat. Bowel sounds are normal. There is no distension.     Palpations: Abdomen is soft. There is no mass.     Tenderness: There is no abdominal tenderness. There is no right CVA tenderness, left CVA tenderness, guarding or rebound.     Hernia: No hernia is present.  Skin:    General: Skin is warm and dry.     Findings: No rash.  Neurological:     Mental Status: She is alert.       Results for orders placed or performed in visit on 12/06/24  POCT Urinalysis Dipstick (Automated)   Collection Time: 12/06/24  4:21 PM  Result Value Ref Range   Color, UA amber    Clarity, UA cloudy    Glucose, UA Negative Negative   Bilirubin, UA negative    Ketones, UA negative    Spec Grav, UA 1.025 1.010 - 1.025   Blood, UA positive    pH, UA 6.0 5.0 - 8.0   Protein, UA Positive (A) Negative   Urobilinogen, UA 0.2 0.2 or 1.0 E.U./dL   Nitrite, UA negative    Leukocytes, UA Large (3+) (A) Negative   *Note: Due to a large number of results and/or encounters for the requested time period, some results have not been displayed. A complete set of results can be found in Results Review.   Last Cr 1.21 Last GFR 45   Lab Results  Component Value Date   HGBA1C 6.4 09/09/2024   Assessment & Plan:   Problem List Items Addressed This Visit     UTI (urinary tract infection) - Primary   UA and story suspicious for infection Treat with keflex  500mg  bid x1wk I called pharmacy to ensure keflex  capsules doesn't have red gelatin/dye - their capsules are green.  Update if not improving with treatment.  UCx sent.       Relevant Medications   cephALEXin  (KEFLEX ) 500 MG capsule     Meds ordered this encounter  Medications   cephALEXin  (KEFLEX ) 500 MG capsule    Sig: Take 1 capsule (500 mg total) by mouth 2 (two) times daily.    Dispense:  14 capsule    Refill:  0    Orders Placed This Encounter  Procedures   Urine Culture   POCT Urinalysis Dipstick  (Automated)    Patient Instructions  Urine suspicious for infection. Start keflex  500mg  twice daily for 1 week. Ensure staying well hydrated with water.  Avoid bladder irritants like dark sodas, spicy foods, caffeine. Urine culture sent Let us  know if not improving with  treatment.   Follow up plan: Return if symptoms worsen or fail to improve.  Anton Blas, MD   "

## 2024-12-06 NOTE — Patient Outreach (Signed)
 Complex Care Management   Visit Note  12/06/2024  Name:  Kelly Kennedy MRN: [id number] DOB: 1941/05/04  Situation: Referral received for Complex Care Management related to Anxiety and HTN I obtained verbal consent from Patient.  Visit completed with Patient  on the phone  Background:   Past Medical History:  Diagnosis Date   adenomatous Colon polyps    colonoscopy 8/99, 12/02, 8/06   Arthritis    Bell's palsy    Cancer (HCC)    DCIS left breast   Cataract    Chest pain    uses NTG as needed   Depression    Diabetes mellitus without complication (HCC)    Diverticulosis of colon 06/2005   Ductal carcinoma in situ (DCIS) of left breast    Family history of adverse reaction to anesthesia     my mother didn't wake up so they had to put her on life support for about an hour or more; my sisiter has problems waking up too from anesthesia.   GERD (gastroesophageal reflux disease)    Glaucoma    H/O hiatal hernia    Headache(784.0)    Hyperglycemia    Hyperlipidemia    Hypertension    Hypothyroid    Insomnia    Irritable bowel    Menopausal symptoms    Pericarditis 1980's   Stroke [facility])    TIA (transient ischemic attack)    09/2014    Assessment: Patient Reported Symptoms:  Cognitive Cognitive Status: Struggling with memory recall (states loses train of thought at times) Cognitive/Intellectual Conditions Management [RPT]: None reported or documented in medical history or problem list   Health Maintenance Behaviors: Annual physical exam  Neurological Neurological Review of Symptoms: Other: [place] Neurological Symptoms/Conditions [RPT]: reports episode 1/1 went blank very briefly, no prior sx, states sister there and no fall, called daughter, drank water and gatorade, felt better and trying to hydrate regularly RNCM messaged PCP Neurological Management Strategies: Medication therapy, Routine screening  HEENT HEENT Symptoms Reported: Not assessed      Cardiovascular  Cardiovascular Symptoms Reported: No symptoms reported Does patient have uncontrolled Hypertension?: No Cardiovascular Management Strategies: Routine screening, Medication therapy Cardiovascular Comment: does not recall BP readings  Respiratory Respiratory Symptoms Reported: No symptoms reported Respiratory Management Strategies: Routine screening  Endocrine Endocrine Symptoms Reported: No symptoms reported Is patient diabetic?: Yes Is patient checking blood sugars at home?: No List most recent blood sugar readings, include date and time of day: denies feeling of low BG before episode of going blank    Gastrointestinal Gastrointestinal Symptoms Reported: Incontinence Gastrointestinal Management Strategies: Medication therapy    Genitourinary Additional Genitourinary Details: reports 2 Candice Tobey burning with urination, dark urine, frequency, no fever sx, conferenced in daughter and she will call PCP, discussed need for urine check for possible infection and risk of affecting kidneys, causing worsening infection, confusion and falls if untreated UTI, explained solifenacin  only for frequency Genitourinary Management Strategies: Incontinence garment/pad  Integumentary Integumentary Symptoms Reported: No symptoms reported Skin Management Strategies: Routine screening  Musculoskeletal Additional Musculoskeletal Details: cane, no recent falls, right knee pain off and on and swelling at times, tylenol , voltaren  help, seeing ortho, injections help pain,   Falls in the past year?: Yes Was there an injury with Fall?: No Patient at Risk for Falls Due to: Impaired balance/gait Fall risk Follow up: Falls evaluation completed, Falls prevention discussed  Psychosocial Psychosocial Symptoms Reported: Sadness - if selected complete PHQ 2-9 Additional Psychological Details: sometimes tired of staying in house,  feels lack of control over life depending on others,  discussed feelings and support provided, declines  LCSW          12/06/2024    PHQ2-9 Depression Screening   Little interest or pleasure in doing things Not at all  Feeling down, depressed, or hopeless Several days  PHQ-2 - Total Score 1  Trouble falling or staying asleep, or sleeping too much    Feeling tired or having little energy    Poor appetite or overeating     Feeling bad about yourself - or that you are a failure or have let yourself or your family down    Trouble concentrating on things, such as reading the newspaper or watching television    Moving or speaking so slowly that other people could have noticed.  Or the opposite - being so fidgety or restless that you have been moving around a lot more than usual    Thoughts that you would be better off dead, or hurting yourself in some way    PHQ2-9 Total Score    If you checked off any problems, how difficult have these problems made it for you to do your work, take care of things at home, or get along with other people    Depression Interventions/Treatment      There were no vitals filed for this visit.    Medications Reviewed Today     Reviewed by Devra Lands, RN (Registered Nurse) on 12/06/24 at [phone]  Med List Status: <None>   Medication Order Taking? Sig Documenting Provider Last Dose Status Informant  amLODipine  (NORVASC ) 10 MG tablet [id number] Yes Take 1 tablet (10 mg total) by mouth daily. [name] Arlyss RAMAN, MD  Active   carvedilol  (COREG ) 12.5 MG tablet [id number] Yes Take 1 tablet (12.5 mg total) by mouth 2 (two) times daily. [name] Arlyss RAMAN, MD  Active Self, Child, Pharmacy Records  cholestyramine  (QUESTRAN ) 4 g packet [id number] Yes Take 1 packet (4 g total) by mouth 3 (three) times daily as needed. [name] Arlyss RAMAN, MD  Active Self, Child, Pharmacy Records  cyanocobalamin  (,VITAMIN B-12,) 1000 MCG/ML injection [id number] Yes Inject 1 mL (1,000 mcg total) into the muscle every 30 (thirty) days. [name] Arlyss RAMAN, MD  Active Self, Child, Pharmacy Records   cyanocobalamin  (VITAMIN B12) injection 1,000 mcg [id number]   [name] Arlyss RAMAN, MD  Active   diclofenac  Sodium (VOLTAREN ) 1 % GEL [id number] Yes Apply 2 g topically 4 (four) times daily as needed. [name] Arlyss RAMAN, MD  Active Self, Child, Pharmacy Records  Evolocumab  (REPATHA  SURECLICK) 140 MG/ML EMMANUEL [id number] Yes ADMINISTER 1 ML UNDER THE SKIN EVERY 14 DAYS Croitoru, Mihai, MD  Active Self, Child, Pharmacy Records  isosorbide  mononitrate (IMDUR ) 30 MG 24 hr tablet [id number] Yes Take 1 tablet (30 mg total) by mouth daily. Croitoru, Jerel, MD  Active Self, Child, Pharmacy Records  levothyroxine  (SYNTHROID ) 75 MCG tablet [id number] Yes TAKE 1 TABLET BY MOUTH EVERY DAY BEFORE BREAKFAST [name] Arlyss RAMAN, MD  Active Self, Child, Pharmacy Records  LUMIGAN 0.01 % SOLN [id number] Yes Place 1 drop into both eyes at bedtime. [provider]  Active Self, Child, Pharmacy Records  Melatonin 5 MG CAPS [id number] Yes Take 1 capsule (5 mg total) by mouth at bedtime. [name] Arlyss RAMAN, MD  Active   memantine  (NAMENDA ) 10 MG tablet [id number] Yes TAKE 1 TABLET BY MOUTH TWICE A DAY [name] Arlyss RAMAN, MD  Active Self, Child, Pharmacy Records  nitroGLYCERIN  (NITROSTAT ) 0.4 MG SL  tablet [id number] Yes Place 1 tablet (0.4 mg total) under the tongue every 5 (five) minutes as needed for chest pain. Croitoru, Mihai, MD  Active Self, Child, Pharmacy Records  omeprazole  (PRILOSEC) 40 MG capsule [id number] Yes TAKE 1 CAPSULE (40 MG TOTAL) BY MOUTH DAILY. Craig Alan SAUNDERS, PA-C  Active Self, Child, Pharmacy Records  solifenacin  (VESICARE ) 5 MG tablet [id number] Yes TAKE 1 TABLET (5 MG TOTAL) BY MOUTH DAILY. [name] Arlyss RAMAN, MD  Active   Vitamin D , Ergocalciferol , (DRISDOL ) 1.25 MG (50000 UNIT) CAPS capsule [id number] Yes Take 1 capsule by mouth once weekly for 12 Kitana Gage. [name] Arlyss RAMAN, MD  Active             Recommendation:   PCP Follow-up Continue Current Plan of Care  Follow Up Plan:   Telephone follow-up in 1  month  [name], MSN, RN [facility]  [facility] RN Care Manager Direct Dial: [phone] Fax: [phone]

## 2024-12-06 NOTE — Telephone Encounter (Signed)
 Would offer OV today for UTI at 4pm

## 2024-12-06 NOTE — Telephone Encounter (Signed)
 Attempted to reach the pt, no answer, unable to LVM. The caller is the daughter of pt, noted to be a poor historian. Pt reporting dysuria x 1 day. Please call Darleen (daughter of pt) back if there is availability that opens up for today. Otherwise she has been instructed to take the pt to UC. Daughter of pt is agreeable.   FYI Only or Action Required?: Action required by provider: request for appointment.  Patient was last seen in primary care on 11/14/2024 by [name] Arlyss RAMAN, MD.  Called Nurse Triage reporting Urinary Tract Infection.  Symptoms began yesterday.  Interventions attempted: Nothing.  Symptoms are: stable.  Triage Disposition: See Physician Within 24 Hours  Patient/caregiver understands and will follow disposition?: Yes   Copied from CRM #[id number]. Topic: Clinical - Red Word Triage >> Dec 06, 2024  9:43 AM Thersia BROCKS wrote: Red Word that prompted transfer to Nurse Triage: Monta called in stated patient needs to come in for urine test, states she is burning when going to bathroom , and dark urine looks like coffee Reason for Disposition  Age > 50 years  Answer Assessment - Initial Assessment Questions 1. SEVERITY: How bad is the pain?  (e.g., Scale 1-10; mild, moderate, or severe)    I think it's pretty bad, not real real bad, makes her don't wanna go to the bathroom per daughter   2. FREQUENCY: How many times have you had painful urination today?      She up at night  3. PATTERN: Is pain present every time you urinate or just sometimes?      I think it's every time  4. ONSET: When did the painful urination start?      Yesterday per daughter  57. FEVER: Do you have a fever? If Yes, ask: What is your temperature, how was it measured, and when did it start?     Unsure  6. PAST UTI: Have you had a urine infection before? If Yes, ask: When was the last time? and What happened that time?      Yes, they checked her for that a couple of weeks ago  and she didn't have it  7. CAUSE: What do you think is causing the painful urination?  (e.g., UTI, scratch, Herpes sore)     UTI  8. OTHER SYMPTOMS: Do you have any other symptoms? (e.g., blood in urine, flank pain, genital sores, urgency, vaginal discharge)     Daughter of pt unsure  Protocols used: Urination Pain - Female-A-AH

## 2024-12-06 NOTE — Telephone Encounter (Signed)
 Spoke to pt's daughter, she cormfired to be here at 3:45 for 4pm appt with Dr. KANDICE this afternoon.

## 2024-12-06 NOTE — Assessment & Plan Note (Signed)
 UA and story suspicious for infection Treat with keflex  500mg  bid x1wk I called pharmacy to ensure keflex  capsules doesn't have red gelatin/dye - their capsules are green.  Update if not improving with treatment.  UCx sent.

## 2024-12-08 LAB — URINE CULTURE
MICRO NUMBER:: 17449301
SPECIMEN QUALITY:: ADEQUATE

## 2024-12-10 ENCOUNTER — Ambulatory Visit: Payer: Self-pay | Admitting: Family Medicine

## 2024-12-10 LAB — OPHTHALMOLOGY REPORT-SCANNED

## 2024-12-11 NOTE — Progress Notes (Signed)
 "   Cardiology Clinic Note   Patient Name: Kelly Kennedy Date of Encounter: 12/13/2024  Primary Care Provider:  [name] Arlyss RAMAN, MD Primary Cardiologist:  Jerel Balding, MD  Patient Profile    Kelly Kennedy 84 year old female presents the clinic today for follow-up evaluation of her  coronary artery disease.  Past Medical History    Past Medical History:  Diagnosis Date   adenomatous Colon polyps    colonoscopy 8/99, 12/02, 8/06   Arthritis    Bell's palsy    Cancer (HCC)    DCIS left breast   Cataract    Chest pain    uses NTG as needed   Depression    Diabetes mellitus without complication (HCC)    Diverticulosis of colon 06/2005   Ductal carcinoma in situ (DCIS) of left breast    Family history of adverse reaction to anesthesia     my mother didn't wake up so they had to put her on life support for about an hour or more; my sisiter has problems waking up too from anesthesia.   GERD (gastroesophageal reflux disease)    Glaucoma    H/O hiatal hernia    Headache(784.0)    Hyperglycemia    Hyperlipidemia    Hypertension    Hypothyroid    Insomnia    Irritable bowel    Menopausal symptoms    Pericarditis 1980's   Stroke [facility])    TIA (transient ischemic attack)    09/2014   Past Surgical History:  Procedure Laterality Date   APPENDECTOMY     BREAST EXCISIONAL BIOPSY Right    benign   BREAST RECONSTRUCTION WITH PLACEMENT OF TISSUE EXPANDER AND FLEX HD (ACELLULAR HYDRATED DERMIS) Left 08/06/2015   Procedure: LEFT BREAST RECONSTRUCTION WITH PLACEMENT OF SALINE IMPLANT AND ACELLULAR DURMAL MATRIX;  Surgeon: Alm Sick, MD;  Location: [facility] OR;  Service: Plastics;  Laterality: Left;   CARDIAC CATHETERIZATION     CATARACT EXTRACTION Bilateral 2016   CHOLECYSTECTOMY  1990   COLONOSCOPY     CYSTECTOMY  05/04/2008   left lower arm, benign   ECTOPIC PREGNANCY SURGERY  1971   FRACTURE SURGERY     nose   LEFT HEART CATH AND CORONARY ANGIOGRAPHY [name]  09/21/2018   Procedure: LEFT HEART CATH AND CORONARY ANGIOGRAPHY;  Surgeon: Anner Alm ORN, MD;  Location: [facility] INVASIVE CV LAB;  Service: Cardiovascular;  Laterality: [name];   LEFT HEART CATH AND CORONARY ANGIOGRAPHY [name] 11/23/2022   Procedure: LEFT HEART CATH AND CORONARY ANGIOGRAPHY;  Surgeon: Wonda Sharper, MD;  Location: [facility] INVASIVE CV LAB;  Service: Cardiovascular;  Laterality: [name];   MASTECTOMY Left 2016   NIPPLE SPARING MASTECTOMY Left 08/06/2015   Procedure: LEFT NIPPLE SPARING MASTECTOMY;  Surgeon: Debby Shipper, MD;  Location: [facility] OR;  Service: General;  Laterality: Left;   ORIF ANKLE FRACTURE Left 10/23/2013   Procedure: OPEN REDUCTION INTERNAL FIXATION (ORIF) LEFT ANKLE FRACTURE;  Surgeon: Kay Sharper Cummins, MD;  Location: [facility] OR;  Service: Orthopedics;  Laterality: Left;   PARTIAL HYSTERECTOMY     ovaries intact   PARTIAL MASTECTOMY WITH NEEDLE LOCALIZATION AND AXILLARY SENTINEL LYMPH NODE BX Left 05/06/2015   Procedure: LEFT BREAST PARTIAL MASTECTOMY WITH NEEDLE LOCALIZATION AND SENTINEL LYMPH NODE MAPPING;  Surgeon: Debby Shipper, MD;  Location: [facility];  Service: General;  Laterality: Left;   TONSILLECTOMY     TUBAL LIGATION      Allergies  Allergies  Allergen Reactions   Ace Inhibitors Swelling  H/o lip swelling with change in losartan  pill   Angiotensin Receptor Blockers Swelling    H/o lip swelling with change in losartan  pill   Statins Anaphylaxis    myalgias   Donepezil  Other (See Comments)    Nightmares   Glipizide  Other (See Comments)    unstready when on med.     Iron  Other (See Comments)    Bloating with oral iron    Sulfa  Antibiotics Other (See Comments)    Intolerant of sulfa  but able to tolerate trimethoprim    Flonase  [Fluticasone  Propionate] Other (See Comments)    Intolerant of smell   Niacin Rash   Red Dye #40 (Allura Red) Rash    History of Present Illness    Kelly Kennedy has a PMH of essential hypertension,  coronary artery disease, PVCs, GERD, fatty liver, dysphagia, hypothyroidism, obesity, anxiety, and hyperlipidemia.  Her PMH also includes nonsustained atrial tachycardia, previous TIA, and probable previous pericarditis.  Coronary CT 10/19 showed high-grade stenosis in her mid left circumflex with FFR of 0.75.  This showed a calcium  score of 274.  Cardiac catheterization did not confirm the presence of single severe stenosis of circumflex artery.  She was noted to have 45% stenosed distal circumflex lesion.  LVEF was 65%.  She was seen by Dr. Francyne on 10/18/2021.  She reported taking occasional sublingual nitroglycerin  for unpredictable episodes of chest tightness.  The nitroglycerin  helps her symptoms resolve however, it takes about 15 minutes before the discomfort to go away.  She notices discomfort with doing housework like mopping or vacuuming.  She notes that her diastolic blood pressure at home is oftentimes in the 40s.  Her blood pressure in the office during her visit was 140/80.  She denied chest pain and dizziness.  She presented to the clinic 06/02/22 for follow-up evaluation stated she had not noticed any chest discomfort while doing increased physical activity.  She did note occasional chest discomfort after eating.   She did use occasional sublingual nitroglycerin  which helps her chest discomfort dissipate after about 15-20 minutes.   Her daughter presented with her and indicated that she ate fried food regularly.  We reviewed her previous coronary CTA and cholesterol.  Her cholesterol was fairly well controlled with an LDL cholesterol of 57 on 10/04/2021.  We reviewed the importance of high-fiber diet.  I ordered a coronary CTA, have her increase her physical activity as tolerated, and plan follow-up for 6 months.  Coronary CTA was not completed.  She presented to the emergency department on 11/22/2022 and was complaining of dyspnea and chest discomfort.  She received nitroglycerin  and 3  aspirin .  Her blood pressure was noted to be significantly elevated at 262/120.  Her initial troponin came back elevated at 199 and on recheck was 353.  She was started on IV heparin .  Her chest x-ray showed no acute abnormalities.  Her EKG showed sinus tachycardia with inferior and lateral T wave inversion.  She underwent repeat cardiac catheterization 11/23/2022 which showed patent LAD with mild nonobstructive plaque, dominant circumflex with mild nonobstructive plaque and nondominant RCA without significant stenosis.  Medical management was recommended.  She presented to the clinic 12/02/2022 for follow-up evaluation stated she felt well .  She denied chest pain.  She had not been very active since the catheterization.  We reviewed her medications and her DVT as well as PE.  She reported compliance with her apixaban .   Her blood pressure initially was 181/91 and on recheck was 136/66.  We  reviewed her angiography results and they expressed understanding.  I asked her to gradually increase her physical activity, continue her current medications and diet and we  planned follow-up in 3 to 4 months.  She presented to the clinic 03/09/23 for follow-up evaluation and stated she felt well.  She reported that she was at the eye doctor 03/08/23 and  had an injection.  She did notice some chest discomfort in the evening which was nonexertional.  She did take 1 sublingual nitroglycerin  and her pain resolved.  She denied any further episodes of pain.  She had been somewhat sedentary due to her right knee pain.  She did receive an injection in her knee and her knee was much better.  I encouraged her to increase her physical activity.  Her blood pressure was well-controlled.  She presented with her daughter.  Her daughter reported a decreased appetite.  She had lost around 8 pounds.  I encouraged her to increase her calories in her diet and we  planned 6 months follow-up.  She was seen in follow-up by me on 10/12/2023.   She remained stable from a cardiac standpoint.  Her blood pressure was well-controlled.  She did note occasional episodes of dizziness with standing.  Follow-up was planned for 6 to 9 months.   She was seen in follow-up by Dr. Francyne on 06/21/2024.  A coronary CTA was ordered and showed a right lung nodule measuring 7 mm.  Reevaluation in 3 months was recommended.  No severe blockages were noted that would require cardiac catheterization or angio plasty.  Recommendation for continuing to maintain LDL cholesterol less than 70 was made.  She presents to the clinic today for follow-up evaluation and states she has not had any further episodes of fainting.  Her daughter reports that she was dehydrated and she has been working on drinking more water.  I recommended that she increase her hydration to be maintained around 48 to 64 ounces daily.  We reviewed her cardiac event monitor.  She expressed understanding.  She reports that she routinely wears lower extremity support stockings.  She does not have them on today.  I agree with daily wear.  I will refill her Imdur  and plan follow-up in 6 months.  Today she denies chest pain, shortness of breath, lower extremity edema, fatigue, palpitations, melena, hematuria, hemoptysis, diaphoresis, weakness, presyncope, syncope, orthopnea, and PND.   Home Medications    Prior to Admission medications   Medication Sig Start Date End Date Taking? Authorizing Provider  amitriptyline  (ELAVIL ) 50 MG tablet Take 1-2 tablets (50-100 mg total) by mouth at bedtime as needed. for sleep 02/07/22   [name] Arlyss RAMAN, MD  amLODipine  (NORVASC ) 5 MG tablet Take 5 mg by mouth daily. 04/24/21   [provider]  aspirin  EC 81 MG tablet Take 1 tablet (81 mg total) by mouth daily. Swallow whole. 02/12/21   Croitoru, Mihai, MD  carvedilol  (COREG ) 12.5 MG tablet TAKE 1 TABLET BY MOUTH 2 TIMES DAILY. 02/14/22   Croitoru, Mihai, MD  cyanocobalamin  (,VITAMIN B-12,) 1000 MCG/ML injection  Inject 1 mL (1,000 mcg total) into the muscle every 30 (thirty) days. 07/07/21   [name] Arlyss RAMAN, MD  diclofenac  Sodium (VOLTAREN ) 1 % GEL Apply 2 g topically 4 (four) times daily as needed. 10/04/21   [name] Arlyss RAMAN, MD  donepezil  (ARICEPT  ODT) 5 MG disintegrating tablet TAKE 1 TABLET BY MOUTH EVERYDAY AT BEDTIME 03/16/22   [name] Arlyss RAMAN, MD  fluocinonide  cream (LIDEX ) 0.05 %  APPLY TOPICALLY DAILY AS NEEDED. USE SPARINGLY. 02/13/22   [name] Arlyss RAMAN, MD  isosorbide  mononitrate (IMDUR ) 30 MG 24 hr tablet TAKE 1 TABLET BY MOUTH EVERY DAY 02/22/22   Croitoru, Mihai, MD  levothyroxine  (SYNTHROID ) 75 MCG tablet TAKE 1 TABLET BY MOUTH DAILY BEFORE BREAKFAST. 12/24/21   [name] Arlyss RAMAN, MD  Lifitegrast  (XIIDRA ) 5 % SOLN Apply 1 drop to eye in the morning and at bedtime. Twice eyes    [provider]  LUMIGAN 0.01 % SOLN Place 1 drop into both eyes at bedtime. 06/29/19   [provider]  nitroGLYCERIN  (NITROSTAT ) 0.4 MG SL tablet Place 1 tablet (0.4 mg total) under the tongue every 5 (five) minutes as needed for chest pain. 02/12/21   Croitoru, Mihai, MD  omeprazole  (PRILOSEC) 40 MG capsule TAKE 1 CAPSULE (40 MG TOTAL) BY MOUTH DAILY. 02/14/22   Aneita Gwendlyn DASEN, MD  REPATHA  SURECLICK 140 MG/ML SOAJ ADMINISTER 1 ML UNDER THE SKIN EVERY 14 DAYS 09/14/21   Croitoru, Jerel, MD  solifenacin  (VESICARE ) 5 MG tablet Take 1 tablet (5 mg total) by mouth daily. 10/04/21   [name] Arlyss RAMAN, MD    Family History    Family History  Problem Relation Age of Onset   Cancer Father        stomach    Stomach cancer Father    Colon cancer Father    Heart disease Son        MI   Arthritis Mother    Diabetes Mother    Cancer Brother        lung cancer   Pancreatic cancer Neg Hx    Breast cancer Neg Hx    Esophageal cancer Neg Hx    Rectal cancer Neg Hx    She indicated that her mother is deceased. She indicated that her father is deceased. She indicated that her brother is deceased. She indicated  that her son is deceased. She indicated that the status of her neg hx is unknown.  Social History    Social History   Socioeconomic History   Marital status: Widowed    Spouse name: Not on file   Number of children: 3   Years of education: Not on file   Highest education level: Not on file  Occupational History   Occupation: retired, previous scientist, product/process development  Tobacco Use   Smoking status: Former    Current packs/day: 0.00    Types: Cigarettes    Quit date: 04/03/2017    Years since quitting: 7.7   Smokeless tobacco: Former   Tobacco comments:    PT STATES SHE DOES NOT SMOKE.  Vaping Use   Vaping status: Never Used  Substance and Sexual Activity   Alcohol  use: No    Alcohol /week: 0.0 standard drinks of alcohol    Drug use: No   Sexual activity: Not Currently  Other Topics Concern   Not on file  Social History Narrative   From [place].   3 children, one died from MI in 01/21/05   Married 1959-01-21   Husband and brother died in the same week in 12/21/12      Patient is right handed.   Patient has hs education.   Patient drinks 1 cup of soda daily, and green tea.   Social Drivers of Health   Tobacco Use: Medium Risk (12/13/2024)   Patient History    Smoking Tobacco Use: Former    Smokeless Tobacco Use: Former    Passive Exposure: Not on file  Financial Resource Strain: Low Risk (06/06/2024)   Overall Financial Resource Strain (CARDIA)    Difficulty of Paying Living Expenses: Not hard at all  Food Insecurity: No Food Insecurity (11/06/2024)   Epic    Worried About Programme Researcher, Broadcasting/film/video in the Last Year: Never true    Ran Out of Food in the Last Year: Never true  Recent Concern: Food Insecurity - Food Insecurity Present (09/12/2024)   Epic    Worried About Programme Researcher, Broadcasting/film/video in the Last Year: Sometimes true    [employer] of Food in the Last Year: Sometimes true  Transportation Needs: No Transportation Needs (11/06/2024)   Epic    Lack of Transportation (Medical): No     Lack of Transportation (Non-Medical): No  Physical Activity: Inactive (11/06/2024)   Exercise Vital Sign    Days of Exercise per Week: 0 days    Minutes of Exercise per Session: 0 min  Stress: Stress Concern Present (11/06/2024)   [employer] of Occupational Health - Occupational Stress Questionnaire    Feeling of Stress: To some extent  Social Connections: Socially Isolated (11/06/2024)   Social Connection and Isolation Panel    Frequency of Communication with Friends and Family: Twice a week    Frequency of Social Gatherings with Friends and Family: Once a week    Attends Religious Services: Never    Database Administrator or Organizations: No    Attends Banker Meetings: Never    Marital Status: Widowed  Intimate Partner Violence: Not At Risk (11/06/2024)   Epic    Fear of Current or Ex-Partner: No    Emotionally Abused: No    Physically Abused: No    Sexually Abused: No  Depression (PHQ2-9): Low Risk (12/06/2024)   Depression (PHQ2-9)    PHQ-2 Score: 1  Recent Concern: Depression (PHQ2-9) - Medium Risk (11/14/2024)   Depression (PHQ2-9)    PHQ-2 Score: 7  Alcohol  Screen: Low Risk (06/06/2024)   Alcohol  Screen    Last Alcohol  Screening Score (AUDIT): 0  Housing: Unknown (11/06/2024)   Epic    Unable to Pay for Housing in the Last Year: No    Number of Times Moved in the Last Year: Not on file    Homeless in the Last Year: No  Utilities: Not At Risk (11/06/2024)   Epic    Threatened with loss of utilities: No  Health Literacy: Inadequate Health Literacy (11/06/2024)   B1300 Health Literacy    Frequency of need for help with medical instructions: Sometimes     Review of Systems    General:  No chills, fever, night sweats or weight changes.  Cardiovascular:  No chest pain, dyspnea on exertion, edema, orthopnea, palpitations, paroxysmal nocturnal dyspnea. Dermatological: No rash, lesions/masses Respiratory: No cough, dyspnea Urologic: No hematuria,  dysuria Abdominal:   No nausea, vomiting, diarrhea, bright red blood per rectum, melena, or hematemesis Neurologic:  No visual changes, wkns, changes in mental status. All other systems reviewed and are otherwise negative except as noted above.  Physical Exam    VS:  BP 130/72   Pulse 91   Ht 5' 5 (1.651 m)   Wt 167 lb 12.8 oz (76.1 kg)   SpO2 96%   BMI 27.92 kg/m  , BMI Body mass index is 27.92 kg/m. GEN: Well nourished, well developed, in no acute distress. HEENT: normal. Neck: Supple, no JVD, carotid bruits, or masses. Cardiac: RRR, no murmurs, rubs, or gallops. No clubbing, cyanosis, generalized bilateral  lower extremity edema right greater than left.  Radials/DP/PT 2+ and equal bilaterally.  Respiratory:  Respirations regular and unlabored, clear to auscultation bilaterally. GI: Soft, nontender, nondistended, BS + x 4. MS: no deformity or atrophy. Skin: warm and dry, no rash. Neuro:  Strength and sensation are intact. Psych: Normal affect.  Accessory Clinical Findings    Recent Labs: 09/09/2024: TSH 1.14 09/12/2024: Magnesium  1.6 09/26/2024: ALT 11; BUN 14; Creatinine, Ser 1.21; Hemoglobin 10.8; Platelets 284; Potassium 3.7; Sodium 141   Recent Lipid Panel    Component Value Date/Time   CHOL 150 09/09/2024 1629   CHOL 121 10/30/2019 0915   TRIG 107.0 09/09/2024 1629   TRIG 203 10/03/2012 0000   HDL 67.50 09/09/2024 1629   HDL 72 10/30/2019 0915   CHOLHDL 2 09/09/2024 1629   VLDL 21.4 09/09/2024 1629   LDLCALC 61 09/09/2024 1629   LDLCALC 26 10/30/2019 0915   LDLDIRECT 154.0 10/27/2017 0756    ECG personally reviewed by me today-none today.   EKG 06/02/2022 normal sinus rhythm LVH possible lateral infarct undetermined age 17 bpm- No acute changes  Cardiac catheterization 09/21/2018 Dist Cx lesion is 45% stenosed -angiographically moderate at best disease. Does not explain CT scan. There is hyperdynamic left ventricular systolic function. The left  ventricular ejection fraction is greater than 65% by visual estimate. LV end diastolic pressure is normal.   Mild to moderate disease distal dominant Circumflex.  There is no culprit lesion to explain CT scan findings. Otherwise essentially normal coronary arteries and a left dominant system. Normal LVEF with hyperdynamic function.  The patient had a brief run of PAT/SVT during injection.   Patient will return to short stay holding area for ongoing care. Would recommend beta-blocker.  The patient felt palpitations with PVCs and brief runs of PAT with left ventricular hemodynamics and LV gram.  She describes her chest discomfort as the symptoms noted with documented ectopy and brief PAT runs.   She will be ready for discharge home after bedrest.   Recommend Aspirin  81mg  daily for moderate CAD.      Alm Clay, MD  Diagnostic Dominance: Left  Intervention    Left Heart Catheterization 11/23/22 Patent left main with no significant stenosis Patent LAD with mild non-obstructive plaque at the first diagonal Patent, dominant LCx with mild nonobstructive plaque in the proximal and mid-vessel Nondominant RCA without significant stenosis Normal LVEDP   Recommend: medical therapy for nonobstructive CAD. Consider non-cardiac symptoms.  Diagnostic Dominance: Left    Echocardiogram 11/23/22  1. Left ventricular ejection fraction, by estimation, is 60 to 65%. The  left ventricle has normal function. The left ventricle has no regional  wall motion abnormalities. There is moderate asymmetric left ventricular  hypertrophy of the basal-septal  segment. Left ventricular diastolic parameters are consistent with Grade I  diastolic dysfunction (impaired relaxation).   2. Right ventricular systolic function is normal. The right ventricular  size is normal.   3. The mitral valve is grossly normal. Trivial mitral valve  regurgitation. No evidence of mitral stenosis.   4. The aortic valve is  tricuspid. There is mild calcification of the  aortic valve. There is mild thickening of the aortic valve. Aortic valve  regurgitation is not visualized. Aortic valve sclerosis/calcification is  present, without any evidence of  aortic stenosis.   5. The inferior vena cava is dilated in size with <50% respiratory  variability, suggesting right atrial pressure of 15 mmHg.   Comparison(s): Compared to prior TTE report in 2016,  the LVEF appears more  robust (previously reported as 50-55%). Otherwise, there is no significant  change.   Cardiac event monitor 11/11/2024    The normal rhythm is normal sinus rhythm with normal circadian variation.   There are rare premature supraventricular beats and a single 8 beat run of nonsustained ectopic atrial tachycardia.  There is no atrial fibrillation.   There are rare premature ventricular beats and there is no complex ventricular arrhythmia.   There were no symptom-triggered events.   Normal arrhythmia monitor.   Electronically signed by Jerel Balding MD.      Patch Wear Time:  14 days and 0 hours (2025-11-14T18:33:16-0500 to 2025-11-28T18:33:16-0500)   Patient had a min HR of 57 bpm, max HR of 136 bpm, and avg HR of 84 bpm. Predominant underlying rhythm was Sinus Rhythm. 1 run of Supraventricular Tachycardia occurred lasting 8 beats with a max rate of 136 bpm (avg 127 bpm). Isolated SVEs were rare  (<1.0%), and no SVE Couplets or SVE Triplets were present. Isolated VEs were rare (<1.0%), and no VE Couplets or VE Triplets were present. Inverted QRS complexes possibly due to inverted placement of device.  Assessment & Plan   1.Coronary artery disease-denies chest discomfort.  No recent nitroglycerin  use.  With cardiac catheterization 10/2722 and was noted to have mild nonobstructive plaque.  Coronary CTA showed a total coronary calcium  score of 549 total plaque volume 439.  She was noted to have moderate proximal LAD stenosis with mild proximal  circumflex stenosis and an FFR of 0.91 post lesion.  Hiatal hernia was also noted.  She was also noted to have enlarging 7 mm right upper lobe pulmonary nodule.  Communication with Dr. [name] about this was completed.  Surveillance in 3 months was recommended. Continue amlodipine , carvedilol , Imdur , nitroglycerin , Repatha  Heart healthy low-sodium diet Maintain physical activity  Essential hypertension-BP today 130/72  Continue amlodipine , carvedilol , Imdur  Heart healthy low-sodium diet-salty 6 reviewed Maintain blood pressure log  SVT-denies palpitations, irregular or accelerated heartbeats.  Noted 1 episode of SVT on cardiac event monitor. Avoid triggers caffeine, chocolate, EtOH, dehydration excetra. May perform vagal maneuvers  Hyperlipidemia-09/09/2024: Cholesterol 150; HDL 67.50; LDL Cholesterol 61; Triglycerides 107.0; VLDL 21.4 Continue Repatha  Heart healthy low-sodium high-fiber diet-reviewed  Type 2 diabetes-glucose 118 on 09/26/24 Heart healthy low-sodium carb modified diet Follows with PCP  Syncope-reassuring cardiac event monitor.  Appears to be related to dehydration. Maintain p.o. hydration Lower extremity support stockings  Disposition: Follow-up with Dr. Balding  in 6 months.     Kelly Kennedy. Janiesha Diehl NP-C     12/13/2024, 11:57 AM [facility] [address] Office [phone] Fax [phone]  Notice: This dictation was prepared with Dragon dictation along with smaller phrase technology. Any transcriptional errors that result from this process are unintentional and may not be corrected upon review.  I spent 14  minutes examining this patient, reviewing medications, and using patient centered shared decision making involving her cardiac care.  Prior to her visit I spent greater than 20 minutes reviewing her past medical history,  medications, and prior cardiac tests. "

## 2024-12-12 ENCOUNTER — Encounter: Admitting: Rehabilitative and Restorative Service Providers"

## 2024-12-13 ENCOUNTER — Ambulatory Visit: Admitting: General Practice

## 2024-12-13 ENCOUNTER — Encounter: Payer: Self-pay | Admitting: General Practice

## 2024-12-13 VITALS — BP 130/72 | HR 91 | Ht 65.0 in | Wt 167.8 lb

## 2024-12-13 DIAGNOSIS — I25118 Atherosclerotic heart disease of native coronary artery with other forms of angina pectoris: Secondary | ICD-10-CM

## 2024-12-13 DIAGNOSIS — I471 Supraventricular tachycardia, unspecified: Secondary | ICD-10-CM | POA: Diagnosis not present

## 2024-12-13 DIAGNOSIS — E7800 Pure hypercholesterolemia, unspecified: Secondary | ICD-10-CM | POA: Diagnosis not present

## 2024-12-13 DIAGNOSIS — R55 Syncope and collapse: Secondary | ICD-10-CM | POA: Diagnosis not present

## 2024-12-13 DIAGNOSIS — E119 Type 2 diabetes mellitus without complications: Secondary | ICD-10-CM

## 2024-12-13 NOTE — Patient Instructions (Signed)
 Medication Instructions:  NO CHANGES *If you need a refill on your cardiac medications before your next appointment, please call your pharmacy*  Lab Work: NO LABS If you have labs (blood work) drawn today and your tests are completely normal, you will receive your results only by: MyChart Message (if you have MyChart) OR A paper copy in the mail If you have any lab test that is abnormal or we need to change your treatment, we will call you to review the results.  Testing/Procedures: NO TESTING  Follow-Up: At [facility], you and your health needs are our priority.  As part of our continuing mission to provide you with exceptional heart care, our providers are all part of one team.  This team includes your primary Cardiologist (physician) and Advanced Practice Providers or APPs (Physician Assistants and Nurse Practitioners) who all work together to provide you with the care you need, when you need it.  Your next appointment:   6 month(s)  Provider:   Jerel Balding, MD or Josefa Beauvais, NP  Other Instructions PROVIDER RECOMMENDS WEARING SUPPORT STOCKINGS CAN BE PURCHASED ON [employer] OR AT ANY MEDICAL SUPPLY STORE, IF NEEDED USE AID TO PUT ON STOCKINGS.

## 2024-12-14 ENCOUNTER — Other Ambulatory Visit: Payer: Self-pay | Admitting: Family Medicine

## 2024-12-14 DIAGNOSIS — I10 Essential (primary) hypertension: Secondary | ICD-10-CM

## 2024-12-16 NOTE — Telephone Encounter (Signed)
 Can't tell if pt had yearly exam since 11/06/24 AWV.  Carvedilol  Last filled:  09/17/24, #180 Last OV:  12/06/24, UTI sxs Next OV:  none

## 2024-12-17 ENCOUNTER — Other Ambulatory Visit: Payer: Self-pay | Admitting: Family Medicine

## 2024-12-18 ENCOUNTER — Ambulatory Visit (INDEPENDENT_AMBULATORY_CARE_PROVIDER_SITE_OTHER): Admitting: Rehabilitative and Restorative Service Providers"

## 2024-12-18 ENCOUNTER — Encounter: Payer: Self-pay | Admitting: Rehabilitative and Restorative Service Providers"

## 2024-12-18 DIAGNOSIS — G8929 Other chronic pain: Secondary | ICD-10-CM

## 2024-12-18 DIAGNOSIS — R262 Difficulty in walking, not elsewhere classified: Secondary | ICD-10-CM

## 2024-12-18 DIAGNOSIS — M25561 Pain in right knee: Secondary | ICD-10-CM

## 2024-12-18 DIAGNOSIS — M6281 Muscle weakness (generalized): Secondary | ICD-10-CM | POA: Diagnosis not present

## 2024-12-18 NOTE — Therapy (Signed)
 " OUTPATIENT PHYSICAL THERAPY TREATMENT   Patient Name: Kelly Kennedy MRN: [id number] DOB:Mar 05, 1941, 84 y.o., female Today's Date: 12/18/2024  END OF SESSION:  PT End of Session - 12/18/24 1510     Visit Number 3    Number of Visits 20    Date for Recertification  01/21/25    Authorization Type U[employer] Dual, no auth needed , 20% coinsurance    PT Start Time 1509    PT Stop Time 1548    PT Time Calculation (min) 39 min    Activity Tolerance Patient tolerated treatment well    Behavior During Therapy [facility] for tasks assessed/performed            Past Medical History:  Diagnosis Date   adenomatous Colon polyps    colonoscopy 8/99, 12/02, 8/06   Arthritis    Bell's palsy    Cancer (HCC)    DCIS left breast   Cataract    Chest pain    uses NTG as needed   Depression    Diabetes mellitus without complication (HCC)    Diverticulosis of colon 06/2005   Ductal carcinoma in situ (DCIS) of left breast    Family history of adverse reaction to anesthesia     my mother didn't wake up so they had to put her on life support for about an hour or more; my sisiter has problems waking up too from anesthesia.   GERD (gastroesophageal reflux disease)    Glaucoma    H/O hiatal hernia    Headache(784.0)    Hyperglycemia    Hyperlipidemia    Hypertension    Hypothyroid    Insomnia    Irritable bowel    Menopausal symptoms    Pericarditis 1980's   Stroke [facility])    TIA (transient ischemic attack)    09/2014   Past Surgical History:  Procedure Laterality Date   APPENDECTOMY     BREAST EXCISIONAL BIOPSY Right    benign   BREAST RECONSTRUCTION WITH PLACEMENT OF TISSUE EXPANDER AND FLEX HD (ACELLULAR HYDRATED DERMIS) Left 08/06/2015   Procedure: LEFT BREAST RECONSTRUCTION WITH PLACEMENT OF SALINE IMPLANT AND ACELLULAR DURMAL MATRIX;  Surgeon: Alm Sick, MD;  Location: [facility] OR;  Service: Plastics;  Laterality: Left;   CARDIAC CATHETERIZATION     CATARACT EXTRACTION Bilateral 2016    CHOLECYSTECTOMY  1990   COLONOSCOPY     CYSTECTOMY  05/04/2008   left lower arm, benign   ECTOPIC PREGNANCY SURGERY  1971   FRACTURE SURGERY     nose   LEFT HEART CATH AND CORONARY ANGIOGRAPHY [name] 09/21/2018   Procedure: LEFT HEART CATH AND CORONARY ANGIOGRAPHY;  Surgeon: Anner Alm ORN, MD;  Location: [facility] INVASIVE CV LAB;  Service: Cardiovascular;  Laterality: [name];   LEFT HEART CATH AND CORONARY ANGIOGRAPHY [name] 11/23/2022   Procedure: LEFT HEART CATH AND CORONARY ANGIOGRAPHY;  Surgeon: Wonda Sharper, MD;  Location: [facility] INVASIVE CV LAB;  Service: Cardiovascular;  Laterality: [name];   MASTECTOMY Left 2016   NIPPLE SPARING MASTECTOMY Left 08/06/2015   Procedure: LEFT NIPPLE SPARING MASTECTOMY;  Surgeon: Debby Shipper, MD;  Location: [facility] OR;  Service: General;  Laterality: Left;   ORIF ANKLE FRACTURE Left 10/23/2013   Procedure: OPEN REDUCTION INTERNAL FIXATION (ORIF) LEFT ANKLE FRACTURE;  Surgeon: Kay Sharper Cummins, MD;  Location: [facility] OR;  Service: Orthopedics;  Laterality: Left;   PARTIAL HYSTERECTOMY     ovaries intact   PARTIAL MASTECTOMY WITH NEEDLE LOCALIZATION AND AXILLARY SENTINEL LYMPH NODE BX Left  05/06/2015   Procedure: LEFT BREAST PARTIAL MASTECTOMY WITH NEEDLE LOCALIZATION AND SENTINEL LYMPH NODE MAPPING;  Surgeon: Debby Shipper, MD;  Location: [facility];  Service: General;  Laterality: Left;   TONSILLECTOMY     TUBAL LIGATION     Patient Active Problem List   Diagnosis Date Noted   Thyroid  nodule 11/19/2024   Lung nodule 11/19/2024   Neck pain 11/19/2024   History of loss of consciousness 09/25/2024   Syncope 09/11/2024   CKD stage 3a, GFR 45-59 ml/min (HCC) 09/11/2024   Hypokalemia 09/11/2024   Vitamin D  deficiency 09/11/2024   Breast symptom 04/04/2024   Mood change 04/04/2024   History of UTI 12/06/2023   Dark stools 07/05/2023   Vertigo 07/05/2023   Knee pain 02/09/2023   Fall 02/09/2023   Pulmonary embolism (HCC) 11/25/2022   DVT (deep  venous thrombosis) (HCC) 11/25/2022   NSTEMI (non-ST elevated myocardial infarction) (HCC) 11/22/2022   Anemia 06/02/2021   Memory loss 02/24/2021   UTI (urinary tract infection) 02/24/2021   B12 deficiency 02/24/2021   Dysphagia 10/28/2020   Abnormality on screening test 03/08/2020   PVCs (premature ventricular contractions) 10/11/2018   Coronary artery disease 09/12/2018   Pulmonary nodule 08/08/2018   Fatty liver 08/05/2018   Abnormal prominence of clavicle 03/24/2017   SUI (stress urinary incontinence, female) 12/20/2016   Neoplasm of left breast, primary tumor staging category Tis: ductal carcinoma in situ (DCIS) 08/06/2015   Malignant neoplasm of upper-outer quadrant of left breast in female, estrogen receptor positive (HCC) 06/10/2015   History of breast cancer 05/14/2015   HLD (hyperlipidemia) 12/11/2014   History of diabetes mellitus 12/11/2014   Essential hypertension 09/28/2014   ? History of TIA (transient ischemic attack) 09/28/2014   Osteopenia 07/09/2014   Advance care planning 07/06/2014   Obesity, unspecified 07/03/2013   Anxiety state 07/03/2013   Medicare annual wellness visit, subsequent 05/27/2012   MENOPAUSAL SYNDROME 06/15/2010   Insomnia 06/15/2010   Non-insulin  dependent type 2 diabetes mellitus (HCC) 09/19/2008   GERD 09/16/2008   Hypothyroidism 08/02/2007   DEPENDENT EDEMA 08/02/2007   SYSTOLIC MURMUR 07/17/2007   ANGINA, HX OF 07/17/2007   Diverticulosis of colon 06/28/2005    PCP: [name] Arlyss EDISON MD  REFERRING PROVIDER: Georgina Ozell LABOR, MD  REFERRING DIAG: [phone] (ICD-10-CM) - Right knee pain, unspecified chronicity  THERAPY DIAG:  Chronic pain of right knee  Muscle weakness (generalized)  Difficulty in walking, not elsewhere classified  Rationale for Evaluation and Treatment: Rehabilitation  ONSET DATE: 2024   SUBJECTIVE:   SUBJECTIVE STATEMENT: Pt indicated feeling slow walking but not painful.   PERTINENT  HISTORY: Depression, history of Lt breast cancer, DM, depression, GERD, Hyperglycemia, hyperlipidemia, HTN, history of TIA, Stroke.   Injection on 10/21/2024.     PAIN:  NPRS scale: no specific pain upon arrival.  Pain location: Rt knee pain Pain description: achy, sharp at times Aggravating factors: prolonged WB activity, walking, steps, squatting.  Relieving factors: injection,   PRECAUTIONS: None  WEIGHT BEARING RESTRICTIONS: No  FALLS:  Has patient fallen in last 6 months? No  LIVING ENVIRONMENT: Lives with: Lives with sister  Lives in: House/apartment Stairs: no stairs inside.  Stairs to enter side door 3 steps, Rt rail going up.    2 steps to enter front door without rails.  Has following equipment at home: [facility], no walker   OCCUPATION: Retired  PLOF: Independent, housework.    PATIENT GOALS: Reduce pain, walking.   OBJECTIVE:   DIAGNOSTIC  FINDINGS:  Knee Rt xrays 10/21/2024: XRs of the right knee from 10/21/2024 were independently reviewed and  interpreted, showing medial joint space narrowing and subchondral  sclerosis. Small osteophyte in the patellofemoral compartment. No fracture  or dislocation seen.   PATIENT SURVEYS:  Patient-Specific Activity Scoring Scheme  0 represents unable to perform. 10 represents able to perform at prior level. 0 1 2 3 4 5 6 7 8 9  10 (Date and Score)   Activity Eval  11/12/2024   1. Walking   5    2. Tub transfers   7    3. Kneeling (without help)  0   4. Stairs  4   5.    Score 4 avg    Total score = sum of the activity scores/number of activities Minimum detectable change (90%CI) for average score = 2 points Minimum detectable change (90%CI) for single activity score = 3 points  COGNITION: 11/12/2024 Overall cognitive status: WFL    SENSATION: 11/12/2024 WFL  EDEMA:  11/12/2024 General bilateral LE edema noted in distal LE.   MUSCLE LENGTH: 11/12/2024 None tested  POSTURE:   11/12/2024 Unremarkable for condition  PALPATION: 11/12/2024 Mild tenderness posterior knee in extension stretch.   LOWER EXTREMITY ROM:   ROM Right Eval 11/12/2024 Left Eval 11/12/2024  Hip flexion    Hip extension    Hip abduction    Hip adduction    Hip internal rotation    Hip external rotation    Knee flexion 116 AROM in supine heel slide 122 AROM in supine heel slide  Knee extension -4 heel prop PROM   Ankle dorsiflexion    Ankle plantarflexion    Ankle inversion    Ankle eversion     (Blank rows = not tested)  LOWER EXTREMITY MMT:  MMT Right Eval 11/12/2024 Left Eval 11/12/2024 Right 12/18/2024 Left 12/18/2024  Hip flexion 5/5 5/5 5/5 5/5  Hip extension      Hip abduction      Hip adduction      Hip internal rotation      Hip external rotation      Knee flexion 5/5 5/5    Knee extension 4/5 5/5 5/5 65, 66 lbs 5/5 71 lbs  Ankle dorsiflexion 5/5 5/5    Ankle plantarflexion      Ankle inversion      Ankle eversion       (Blank rows = not tested)  LOWER EXTREMITY SPECIAL TESTS:  11/12/2024 None tested  FUNCTIONAL TESTS:  11/12/2024 18 inch chair transfer: able to perform s UE assist on 1st try Lt SLS: 4 seconds Rt SLS: 5 seconds  GAIT: 11/12/2024 Independent ambulation, mild variance in stance on Rt leg at times in ambulation.  TODAY'S TREATMENT                                                                          DATE: 12/18/2024 Therex: Nustep lvl 5 8 mins UE/LE for ROM, endurance Seated SLR  x 10, bilaterally  Standing knee TKE green band 3 sec hold x 15 bilaterally, cues for techniques throughout.  Sit to stand to sit 18 inch chair with airex pad x 10    Neuro Re-ed (balance, muscle activation) Feet together stance 30 seconds  Tandem stance 30 sec x 3 bilaterally  in // bars with occasional to moderate HHA, SBA  Seated scapular retraction for improved postural positioning.  3 sec hold x 10     TODAY'S TREATMENT                                                                          DATE: 12/04/24 Nustep seat 9 level 4 6 min Sit to stand holding 2# ball 10x at 22 inches 10x without ball , adding step forward Seated SLR 10x no weight   10x2 with 1# Quad set towel under ankle 3x10 with 3 sec hold Seated LAQ 10x no weight  2x10 1# Seated Marching 20x 2 Manual hamstring stretching    TODAY'S TREATMENT                                                                          DATE: 11/12/2024 Therex:    HEP instruction/performance c cues for techniques, handout provided.  Trial set performed of each for comprehension and symptom assessment.  See below for exercise list  PATIENT EDUCATION:  11/12/2024 Education details: HEP, POC Person educated: Patient Education method: Explanation, Demonstration, Verbal cues, and Handouts Education comprehension: verbalized understanding, returned demonstration, and verbal cues required  HOME EXERCISE PROGRAM: Access Code: [email or web address] URL: [email or web address] Date: 11/12/2024 Prepared by: Ozell Silvan  Exercises - Seated Long Arc Quad  - 2-3 x daily - 7 x weekly - 1-2 sets - 10 reps - 2 hold - Supine Heel Slide  - 2-3 x daily - 7 x weekly - 1-2 sets - 10 reps - 2 hold - Long Sitting Quad Set with Towel Roll Under Heel  - 1-2 x daily - 7 x weekly - 1 sets - 10 reps - 5 hold - Seated Quad Set  - 1-2 x daily - 7 x weekly - 1 sets - 10 reps - 5 hold - Seated Straight Leg Raise   - 1-2 x daily - 7 x weekly - 2-3 sets - 8-15 reps - Sit to Stand  - 3 x daily - 7 x weekly - 1 sets - 10 reps  ASSESSMENT:  CLINICAL IMPRESSION: Fair control in static balance tandem.  Standing postural forward trunk lean impactful for ambulation and balance control.  Continued functional WBstrengthening indicated at this  time.  Dynamometry showed good overall 1 rep testing.     OBJECTIVE IMPAIRMENTS: Abnormal gait, decreased activity tolerance, decreased balance, decreased coordination, decreased endurance, decreased mobility, difficulty walking, decreased ROM, decreased strength, increased edema, increased fascial restrictions, impaired perceived functional ability, impaired flexibility, improper body mechanics, and pain.   ACTIVITY LIMITATIONS: carrying, lifting, bending, standing, squatting, stairs, transfers, bathing, and locomotion level  PARTICIPATION LIMITATIONS: meal prep, cleaning, laundry, interpersonal relationship, shopping, and community activity  PERSONAL FACTORS: Time since onset of injury/illness/exacerbation and Depression, history of Lt breast cancer, DM, depression, GERD, Hyperglycemia, hyperlipidemia, HTN, history of TIA, Stroke.  are also affecting patient's functional outcome.   REHAB POTENTIAL: Good  CLINICAL DECISION MAKING: Stable/uncomplicated  EVALUATION COMPLEXITY: Low   GOALS: Goals reviewed with patient? Yes  SHORT TERM GOALS: (target date for Short term goals are 3 weeks 12/03/2024)   1.  Patient will demonstrate independent use of home exercise program to maintain progress from in clinic treatments.  Goal status: Met in review 12/18/2024  LONG TERM GOALS: (target dates for all long term goals are 10 weeks  01/21/2025 )   1. Patient will demonstrate/report pain at worst less than or equal to 2/10 to facilitate minimal limitation in daily activity secondary to pain symptoms.  Goal status: on going 12/18/2024   2. Patient will demonstrate independent use of home exercise program to facilitate ability to maintain/progress functional gains from skilled physical therapy services.  Goal status: on going 12/18/2024   3. Patient will demonstrate Patient specific functional scale avg > or = 8/10 to indicate reduced disability due to condition.   Goal status: on going  12/18/2024   4.  Patient will demonstrate Rt  LE MMT 5/5 , dynamometry with 10% of Lt for knee extension to faciltiate usual transfers, stairs, squatting at PLOF for daily life.    Goal status: Met 12/18/2024   5.  Patient will demonstrate bilateral SLS > 10 seconds for improved stability in ambulation.  Goal status: on going 12/18/2024   6.  Patient will demonstrate ascending/descending stairs reciprocally s UE assist for community integration.   Goal status: on going 12/18/2024    PLAN:  PT FREQUENCY: 1-2x/week  PT DURATION: 10 weeks  PLANNED INTERVENTIONS: Can include 02853- PT Re-evaluation, 97110-Therapeutic exercises, 97530- Therapeutic activity, 97112- Neuromuscular re-education, 97535- Self Care, 97140- Manual therapy, [phone]- Gait training, [phone]- Orthotic Fit/training, [phone]- Canalith repositioning, [id number]- Aquatic Therapy, [phone]- Electrical stimulation (unattended), [id number] Physical performance testing, 97016- Vasopneumatic device, [id number]- Ultrasound, [id number]- Traction (mechanical), [id number]- Ionotophoresis 4mg /ml Dexamethasone ,  79439 - Needle insertion w/o injection 1 or 2 muscles, 20561 - Needle insertion w/o injection 3 or more muscles.   Patient/Family education, Balance training, Stair training, Taping, Dry Needling, Joint mobilization, Joint manipulation, Spinal manipulation, Spinal mobilization, Scar mobilization, Vestibular training, Visual/preceptual remediation/compensation, DME instructions, Cryotherapy, and Moist heat.  All performed as medically necessary.  All included unless contraindicated  PLAN FOR NEXT SESSION:   Balance, postural control.   Ozell Silvan, PT, DPT, OCS, ATC 12/18/24  3:47 PM      "

## 2024-12-19 ENCOUNTER — Ambulatory Visit: Admitting: *Deleted

## 2024-12-19 DIAGNOSIS — Z23 Encounter for immunization: Secondary | ICD-10-CM | POA: Diagnosis not present

## 2024-12-19 DIAGNOSIS — E538 Deficiency of other specified B group vitamins: Secondary | ICD-10-CM | POA: Diagnosis not present

## 2024-12-19 MED ORDER — CYANOCOBALAMIN 1000 MCG/ML IJ SOLN
1000.0000 ug | Freq: Once | INTRAMUSCULAR | Status: AC
  Administered 2024-12-19: 1000 ug via INTRAMUSCULAR

## 2024-12-19 NOTE — Progress Notes (Signed)
 Per orders of Arlyss Solian, MD injection of Vitamin B12  and Influenza vaccine given by Manuelita JAYSON Frost, B12 in L deltoid and flu vaccine in R deltoid. Patient tolerated injection well.

## 2024-12-25 ENCOUNTER — Encounter: Admitting: Rehabilitative and Restorative Service Providers"

## 2024-12-27 ENCOUNTER — Other Ambulatory Visit: Payer: Self-pay | Admitting: Physician Assistant

## 2024-12-27 DIAGNOSIS — K219 Gastro-esophageal reflux disease without esophagitis: Secondary | ICD-10-CM

## 2024-12-27 NOTE — Telephone Encounter (Signed)
 Patient and patient's daughter called stating they will need to make a 3-6 month follow up office visit. Acknowledged understanding and recall placed to make appointment in 3 months.

## 2025-01-03 ENCOUNTER — Other Ambulatory Visit: Payer: Self-pay

## 2025-01-03 ENCOUNTER — Ambulatory Visit: Payer: Self-pay

## 2025-01-03 ENCOUNTER — Ambulatory Visit

## 2025-01-03 ENCOUNTER — Other Ambulatory Visit

## 2025-01-03 VITALS — BP 115/69 | HR 82 | Temp 97.7°F | Wt 168.9 lb

## 2025-01-03 DIAGNOSIS — R911 Solitary pulmonary nodule: Secondary | ICD-10-CM

## 2025-01-03 DIAGNOSIS — N3000 Acute cystitis without hematuria: Secondary | ICD-10-CM

## 2025-01-03 LAB — POCT URINALYSIS DIPSTICK
Bilirubin, UA: NEGATIVE
Glucose, UA: NEGATIVE
Ketones, UA: NEGATIVE
Protein, UA: POSITIVE — AB
Spec Grav, UA: 1.02
Urobilinogen, UA: 0.2 U/dL
pH, UA: 6

## 2025-01-03 MED ORDER — CEPHALEXIN 500 MG PO CAPS: 500.0000 mg | ORAL_CAPSULE | Freq: Two times a day (BID) | ORAL | 0 refills | Status: AC

## 2025-01-03 NOTE — Progress Notes (Signed)
 "     Acute visit   Patient: Kelly Kennedy   DOB: 07-31-1941   84 y.o. Female  MRN: [id number] PCP: [name] Arlyss RAMAN, MD   Chief Complaint  Patient presents with   urinay    Burn when urinating  No odor no discharge some itching   Subjective    Discussed the use of AI scribe software for clinical note transcription with the patient, who gave verbal consent to proceed.  History of Present Illness Kelly Kennedy is an 84 year old female who presents with burning during urination. She is accompanied by her daughter.  She experiences a burning sensation during urination, which she associates with recurrent urinary tract infections. She has had multiple urinary tract infections in the past, with symptoms resolving after antibiotic treatment.  She was previously on a prophylactic antibiotic regimen to prevent urinary tract infections, which was discontinued by her previous provider to reduce medication use. Her daughter notes that the urinary tract infections began recurring after discontinuation of the prophylactic antibiotic.  No abdominal pain or blood in urine, but she reports a little low back pain, which has been ongoing for a while. She has not experienced any fevers or chills.   Review of systems as noted in HPI.   Objective    BP 115/69   Pulse 82   Temp 97.7 F (36.5 C) (Oral)   Wt 168 lb 14.4 oz (76.6 kg)   SpO2 98%   BMI 28.11 kg/m  Physical Exam Constitutional:      Appearance: Normal appearance.  HENT:     Head: Normocephalic and atraumatic.     Mouth/Throat:     Mouth: Mucous membranes are moist.  Eyes:     Pupils: Pupils are equal, round, and reactive to light.  Pulmonary:     Effort: Pulmonary effort is normal.  Skin:    General: Skin is warm.  Neurological:     General: No focal deficit present.     Mental Status: She is alert.      Results for orders placed or performed in visit on 01/03/25  POCT Urinalysis Dipstick  Result Value  Ref Range   Color, UA     Clarity, UA     Glucose, UA Negative Negative   Bilirubin, UA n    Ketones, UA n    Spec Grav, UA 1.020 1.010 - 1.025   Blood, UA small    pH, UA 6.0 5.0 - 8.0   Protein, UA Positive (A) Negative   Urobilinogen, UA 0.2 0.2 or 1.0 E.U./dL   Nitrite, UA plus    Leukocytes, UA Large (3+) (A) Negative   Appearance cloudy    Odor      Assessment & Plan     Problem List Items Addressed This Visit       Genitourinary   UTI (urinary tract infection) - Primary   Relevant Medications   cephALEXin  (KEFLEX ) 500 MG capsule   Other Relevant Orders   POCT Urinalysis Dipstick (Completed)   Urine Culture    Assessment & Plan Acute cystitis Recurrent urinary tract infections with current symptoms of dysuria and urinary frequency. No hematuria, fever, or chills. Current urine sample indicates another urinary tract infection. - Prescribed Keflex  to be taken twice daily for 7 days. - Advised to contact provider if symptoms persist after completing antibiotic course. - Recommended discussing with PCP about resuming prophylactic antibiotic if recurrent infections continue.    Meds ordered this encounter  Medications   cephALEXin  (KEFLEX ) 500 MG capsule    Sig: Take 1 capsule (500 mg total) by mouth 2 (two) times daily for 7 days.    Dispense:  14 capsule    Refill:  0     No follow-ups on file.      Isaiah DELENA Pepper, MD  [facility] [phone] (phone) [phone] (fax) "

## 2025-01-03 NOTE — Patient Outreach (Signed)
 Complex Care Management   Visit Note  01/03/2025  Name:  Kelly Kennedy MRN: [id number] DOB: [birth year]  Situation: Referral received for Complex Care Management related to anxiety and HTN. I obtained verbal consent from Patient.  Visit completed with Patient  on the phone  Background:   Past Medical History:  Diagnosis Date   adenomatous Colon polyps    colonoscopy 8/99, 12/02, 8/06   Arthritis    Bell's palsy    Cancer (HCC)    DCIS left breast   Cataract    Chest pain    uses NTG as needed   Depression    Diabetes mellitus without complication (HCC)    Diverticulosis of colon 06/2005   Ductal carcinoma in situ (DCIS) of left breast    Family history of adverse reaction to anesthesia     my mother didn't wake up so they had to put her on life support for about an hour or more; my sisiter has problems waking up too from anesthesia.   GERD (gastroesophageal reflux disease)    Glaucoma    H/O hiatal hernia    Headache(784.0)    Hyperglycemia    Hyperlipidemia    Hypertension    Hypothyroid    Insomnia    Irritable bowel    Menopausal symptoms    Pericarditis 1980's   Stroke [facility])    TIA (transient ischemic attack)    09/2014    Assessment: Patient Reported Symptoms:  Cognitive Cognitive Status: Struggling with memory recall, Alert and oriented to person, place, and time, Normal speech and language skills Cognitive/Intellectual Conditions Management [RPT]: None reported or documented in medical history or problem list   Health Maintenance Behaviors: Annual physical exam, Stress management Healing Pattern: Average Health Facilitated by: Healthy diet, Rest, Stress management  Neurological Neurological Review of Symptoms: Other: [place] Neurological Symptoms/Conditions [RPT]: Patient has been staying hydrated and reports she has not had any more episodes. Neurological Management Strategies: Medication therapy, Routine screening Neurological Self-Management Outcome:  4 (good) Neurological Comment: ambulates with a cane- no new falls since last visit.  HEENT HEENT Symptoms Reported: No symptoms reported HEENT Management Strategies: Routine screening HEENT Self-Management Outcome: 4 (good)    Cardiovascular Cardiovascular Symptoms Reported: No symptoms reported Does patient have uncontrolled Hypertension?: No Cardiovascular Management Strategies: Routine screening, Medication therapy Weight: 166 lb (75.3 kg) Cardiovascular Self-Management Outcome: 4 (good) Cardiovascular Comment: Patient states she does not recall her BP reading and can't find batteries for her BP machine. [name] states she will check it once she returns from work.  Respiratory Respiratory Symptoms Reported: No symptoms reported Respiratory Management Strategies: Routine screening Respiratory Self-Management Outcome: 4 (good)  Endocrine Endocrine Symptoms Reported: No symptoms reported Is patient diabetic?: Yes Is patient checking blood sugars at home?: No Endocrine Self-Management Outcome: 4 (good)  Gastrointestinal Gastrointestinal Symptoms Reported: No symptoms reported Additional Gastrointestinal Details: reports occasional incontinence. Gastrointestinal Management Strategies: Adequate rest, Coping strategies Gastrointestinal Self-Management Outcome: 4 (good)    Genitourinary Genitourinary Symptoms Reported: Pain/burning with urination Additional Genitourinary Details: Patient reports burning with urination. [name] states she was given an ABT 2 weeks ago and finished it. Patient states the symptoms have returned. Advised that I would call the PCP to request an appointment. Patient declined stating that her daughter handles all of her appointments. Called [name] and advised her that patient was c/o burning with urination again. Daughter states she will call PCP office. Genitourinary Management Strategies: Incontinence garment/pad Genitourinary  Self-Management Outcome: 3 (uncertain)  Integumentary Integumentary Symptoms Reported: No symptoms reported Skin Management Strategies: Routine screening Skin Self-Management Outcome: 4 (good)  Musculoskeletal Musculoskelatal Symptoms Reviewed: Limited mobility Additional Musculoskeletal Details: Ambulates with a cane- no falls since last visit. Musculoskeletal Management Strategies: Coping strategies Musculoskeletal Self-Management Outcome: 4 (good) Falls in the past year?: Yes Number of falls in past year: 2 or more Was there an injury with Fall?: No Fall Risk Category Calculator: 2 Patient Fall Risk Level: Moderate Fall Risk Patient at Risk for Falls Due to: Impaired balance/gait, History of fall(s) Fall risk Follow up: Falls prevention discussed  Psychosocial Psychosocial Symptoms Reported: Sadness - if selected complete PHQ 2-9 Behavioral Management Strategies: Medication therapy Behavioral Health Self-Management Outcome: 4 (good) Major Change/Loss/Stressor/Fears (CP): Medical condition, self, Relationship concerns Techniques to Cope with Loss/Stress/Change: Diversional activities Quality of Family Relationships: involved, helpful Do you feel physically threatened by others?: No    01/03/2025    PHQ2-9 Depression Screening   Little interest or pleasure in doing things Not at all  Feeling down, depressed, or hopeless Several days  PHQ-2 - Total Score 1  Trouble falling or staying asleep, or sleeping too much More than half the days  Feeling tired or having little energy Several days  Poor appetite or overeating  Several days  Feeling bad about yourself - or that you are a failure or have let yourself or your family down Several days  Trouble concentrating on things, such as reading the newspaper or watching television Not at all  Moving or speaking so slowly that other people could have noticed.  Or the opposite - being so fidgety or restless that you have been moving around a lot  more than usual Not at all  Thoughts that you would be better off dead, or hurting yourself in some way Not at all  PHQ2-9 Total Score 6  If you checked off any problems, how difficult have these problems made it for you to do your work, take care of things at home, or get along with other people Somewhat difficult  Depression Interventions/Treatment      Today's Vitals   01/03/25 0946  Weight: 166 lb (75.3 kg)   Pain Scale: 0-10 Pain Score: 0-No pain  Medications Reviewed Today     Reviewed by Leodis Warren DEL, RN (Registered Nurse) on 01/03/25 at [phone]  Med List Status: <None>   Medication Order Taking? Sig Documenting Provider Last Dose Status Informant  amLODipine  (NORVASC ) 10 MG tablet [id number] Yes TAKE 1 TABLET BY MOUTH EVERY DAY [name] Arlyss RAMAN, MD  Active   carvedilol  (COREG ) 12.5 MG tablet [id number] Yes TAKE 1 TABLET BY MOUTH 2 TIMES DAILY. [name] Arlyss RAMAN, MD  Active   cephALEXin  (KEFLEX ) 500 MG capsule [id number] Yes Take 1 capsule (500 mg total) by mouth 2 (two) times daily. Rilla Baller, MD  Active   cholestyramine  (QUESTRAN ) 4 g packet [id number] Yes Take 1 packet (4 g total) by mouth 3 (three) times daily as needed. [name] Arlyss RAMAN, MD  Active Self, Child, Pharmacy Records  cyanocobalamin  (,VITAMIN B-12,) 1000 MCG/ML injection [id number] Yes Inject 1 mL (1,000 mcg total) into the muscle every 30 (thirty) days. [name] Arlyss RAMAN, MD  Active Self, Child, Pharmacy Records  cyanocobalamin  (VITAMIN B12) injection 1,000 mcg [id number]   [name] Arlyss RAMAN, MD  Active   diclofenac  Sodium (VOLTAREN ) 1 % GEL [id number] Yes Apply 2 g topically 4 (four) times daily as needed. [name] Arlyss RAMAN, MD  Active Self, Child, Pharmacy Records  Evolocumab  (  REPATHA  SURECLICK) 140 MG/ML SOAJ [id number] Yes ADMINISTER 1 ML UNDER THE SKIN EVERY 14 DAYS Croitoru, Mihai, MD  Active Self, Child, Pharmacy Records  isosorbide  mononitrate (IMDUR ) 30 MG 24 hr tablet [id number] Yes Take 1 tablet (30 mg total)  by mouth daily. Croitoru, Jerel, MD  Active Self, Child, Pharmacy Records  levothyroxine  (SYNTHROID ) 75 MCG tablet [id number] Yes TAKE 1 TABLET BY MOUTH EVERY DAY BEFORE BREAKFAST [name] Arlyss RAMAN, MD  Active Self, Child, Pharmacy Records  LUMIGAN 0.01 % SOLN [id number] Yes Place 1 drop into both eyes at bedtime. [provider]  Active Self, Child, Pharmacy Records  Melatonin 5 MG CAPS [id number] Yes Take 1 capsule (5 mg total) by mouth at bedtime. [name] Arlyss RAMAN, MD  Active   memantine  (NAMENDA ) 10 MG tablet [id number] Yes TAKE 1 TABLET BY MOUTH TWICE A DAY [name] Arlyss RAMAN, MD  Active Self, Child, Pharmacy Records  nitroGLYCERIN  (NITROSTAT ) 0.4 MG SL tablet [id number] Yes Place 1 tablet (0.4 mg total) under the tongue every 5 (five) minutes as needed for chest pain. Croitoru, Mihai, MD  Active Self, Child, Pharmacy Records  omeprazole  (PRILOSEC) 40 MG capsule [id number] Yes TAKE 1 CAPSULE (40 MG TOTAL) BY MOUTH DAILY. Craig Alan SAUNDERS, PA-C  Active   solifenacin  (VESICARE ) 5 MG tablet [id number] Yes TAKE 1 TABLET (5 MG TOTAL) BY MOUTH DAILY. [name] Arlyss RAMAN, MD  Active             Recommendation:   PCP Follow-up Continue Current Plan of Care  Follow Up Plan:   Telephone follow up appointment date/time:  01/27/25 at 9 am Daughter to contact PCP office in reference to patient complaining of burning with urination.   Warren Quivers RN CM Population Health-Complex Care Management [facility] [phone]

## 2025-01-03 NOTE — Telephone Encounter (Signed)
 Thank you for getting patient scheduled.

## 2025-01-03 NOTE — Telephone Encounter (Signed)
" °  FYI Only or Action Required?: Action required by provider: request for appointment.  Patient was last seen in primary care on 12/06/2024 by Rilla Baller, MD.  Called Nurse Triage reporting Urinary symptoms.  Symptoms began several days ago.  Interventions attempted: Prescription medications: finished antibiotics.  Symptoms are: unchanged.Continues to have urinary burning after finishing antibiotics.  Triage Disposition: See Physician Within 24 Hours  Patient/caregiver understands and will follow disposition?: Yes     Reason for Triage: Patients [name] calling to advised during patients AWV she told the nurse she is having burning while urinating.  Reason for Disposition  Urinating more frequently than usual (i.e., frequency) OR new-onset of the feeling of an urgent need to urinate (i.e., urgency)  Answer Assessment - Initial Assessment Questions 1. SYMPTOM: What's the main symptom you're concerned about? (e.g., frequency, incontinence)     burning 2. ONSET: When did the    start?     Several weeks 3. PAIN: Is there any pain? If Yes, ask: How bad is it? (Scale: 1-10; mild, moderate, severe)     unsure 4. CAUSE: What do you think is causing the symptoms?     UTI 5. OTHER SYMPTOMS: Do you have any other symptoms? (e.g., blood in urine, fever, flank pain, pain with urination)     NO 6. PREGNANCY: Is there any chance you are pregnant? When was your last menstrual period?     NO  Protocols used: Urinary Symptoms-A-AH  "

## 2025-01-03 NOTE — Patient Instructions (Signed)
 Visit Information  Thank you for taking time to visit with me today. Please don't hesitate to contact me if I can be of assistance to you before our next scheduled appointment.  Your next care management appointment is by telephone on 01/27/25 at 9 am.  Patient/Caregiver advised to let PCP know of continued painful urination.  Please call the care guide team at [phone] if you need to cancel, schedule, or reschedule an appointment.   Please call the Suicide and Crisis Lifeline: 988 call the [place]  National Suicide Prevention Lifeline: 786-819-4149 or TTY: 507-277-4732 TTY 508-022-6215) to talk to a trained counselor call 1-800-273-TALK (toll free, 24 hour hotline) if you are experiencing a Mental Health or Behavioral Health Crisis or need someone to talk to.  Warren Quivers RN CM Population Health-Complex Care Management [facility] [phone]

## 2025-01-03 NOTE — Telephone Encounter (Signed)
 Next Appt With Family Medicine [place] DELENA Pepper, MD) 01/03/2025 at 1:20 PM

## 2025-01-27 ENCOUNTER — Telehealth
